# Patient Record
Sex: Male | Born: 1960
Health system: Southern US, Community
[De-identification: ages and names within clinical notes are randomized; demographics above are authoritative.]

## PROBLEM LIST (undated history)

## (undated) ENCOUNTER — Emergency Department (HOSPITAL_BASED_OUTPATIENT_CLINIC_OR_DEPARTMENT_OTHER): Admission: EM | Payer: 59

## (undated) DIAGNOSIS — Q2112 Patent foramen ovale: Secondary | ICD-10-CM

## (undated) DIAGNOSIS — I10 Essential (primary) hypertension: Secondary | ICD-10-CM

## (undated) DIAGNOSIS — J189 Pneumonia, unspecified organism: Secondary | ICD-10-CM

## (undated) DIAGNOSIS — Q211 Atrial septal defect: Secondary | ICD-10-CM

## (undated) DIAGNOSIS — Z86718 Personal history of other venous thrombosis and embolism: Secondary | ICD-10-CM

## (undated) DIAGNOSIS — K648 Other hemorrhoids: Secondary | ICD-10-CM

## (undated) DIAGNOSIS — I739 Peripheral vascular disease, unspecified: Secondary | ICD-10-CM

## (undated) DIAGNOSIS — K219 Gastro-esophageal reflux disease without esophagitis: Secondary | ICD-10-CM

## (undated) DIAGNOSIS — F1910 Other psychoactive substance abuse, uncomplicated: Secondary | ICD-10-CM

## (undated) DIAGNOSIS — N280 Ischemia and infarction of kidney: Secondary | ICD-10-CM

## (undated) DIAGNOSIS — D689 Coagulation defect, unspecified: Secondary | ICD-10-CM

## (undated) HISTORY — PX: CHOLECYSTECTOMY: SHX55

## (undated) HISTORY — DX: Coagulation defect, unspecified: D68.9

## (undated) HISTORY — PX: WRIST SURGERY: SHX841

## (undated) HISTORY — DX: Peripheral vascular disease, unspecified: I73.9

## (undated) HISTORY — DX: Gastro-esophageal reflux disease without esophagitis: K21.9

## (undated) HISTORY — DX: Ischemia and infarction of kidney: N28.0

## (undated) HISTORY — DX: Personal history of other venous thrombosis and embolism: Z86.718

## (undated) HISTORY — DX: Other psychoactive substance abuse, uncomplicated: F19.10

## (undated) HISTORY — PX: TONSILLECTOMY: SUR1361

## (undated) HISTORY — PX: COLONOSCOPY: SHX174

## (undated) HISTORY — DX: Essential (primary) hypertension: I10

## (undated) HISTORY — DX: Other hemorrhoids: K64.8

---

## 1999-03-08 ENCOUNTER — Ambulatory Visit (HOSPITAL_COMMUNITY): Admission: RE | Admit: 1999-03-08 | Discharge: 1999-03-08 | Payer: Self-pay | Admitting: *Deleted

## 1999-03-09 ENCOUNTER — Encounter (HOSPITAL_COMMUNITY): Admission: RE | Admit: 1999-03-09 | Discharge: 1999-06-07 | Payer: Self-pay | Admitting: *Deleted

## 2000-09-16 ENCOUNTER — Encounter: Payer: Self-pay | Admitting: Family Medicine

## 2000-09-16 ENCOUNTER — Emergency Department (HOSPITAL_COMMUNITY): Admission: EM | Admit: 2000-09-16 | Discharge: 2000-09-16 | Payer: Self-pay | Admitting: *Deleted

## 2000-10-06 ENCOUNTER — Encounter (INDEPENDENT_AMBULATORY_CARE_PROVIDER_SITE_OTHER): Payer: Self-pay | Admitting: Specialist

## 2000-10-06 ENCOUNTER — Other Ambulatory Visit: Admission: RE | Admit: 2000-10-06 | Discharge: 2000-10-06 | Payer: Self-pay | Admitting: *Deleted

## 2001-09-01 ENCOUNTER — Encounter: Payer: Self-pay | Admitting: Surgery

## 2001-09-01 ENCOUNTER — Emergency Department (HOSPITAL_COMMUNITY): Admission: EM | Admit: 2001-09-01 | Discharge: 2001-09-01 | Payer: Self-pay | Admitting: Emergency Medicine

## 2001-10-05 ENCOUNTER — Ambulatory Visit (HOSPITAL_COMMUNITY): Admission: RE | Admit: 2001-10-05 | Discharge: 2001-10-05 | Payer: Self-pay | Admitting: *Deleted

## 2001-10-05 ENCOUNTER — Encounter: Payer: Self-pay | Admitting: *Deleted

## 2003-03-26 ENCOUNTER — Encounter: Admission: RE | Admit: 2003-03-26 | Discharge: 2003-03-26 | Payer: Self-pay | Admitting: Family Medicine

## 2003-03-26 ENCOUNTER — Encounter: Payer: Self-pay | Admitting: Family Medicine

## 2004-08-12 ENCOUNTER — Ambulatory Visit: Payer: Self-pay | Admitting: Family Medicine

## 2004-11-13 ENCOUNTER — Emergency Department (HOSPITAL_COMMUNITY): Admission: EM | Admit: 2004-11-13 | Discharge: 2004-11-13 | Payer: Self-pay | Admitting: Emergency Medicine

## 2005-01-06 ENCOUNTER — Emergency Department (HOSPITAL_COMMUNITY): Admission: EM | Admit: 2005-01-06 | Discharge: 2005-01-06 | Payer: Self-pay | Admitting: Emergency Medicine

## 2005-01-10 ENCOUNTER — Ambulatory Visit: Payer: Self-pay | Admitting: Internal Medicine

## 2005-05-06 ENCOUNTER — Emergency Department (HOSPITAL_COMMUNITY): Admission: EM | Admit: 2005-05-06 | Discharge: 2005-05-06 | Payer: Self-pay | Admitting: *Deleted

## 2005-05-31 ENCOUNTER — Ambulatory Visit: Payer: Self-pay | Admitting: Family Medicine

## 2005-05-31 ENCOUNTER — Ambulatory Visit (HOSPITAL_COMMUNITY): Admission: RE | Admit: 2005-05-31 | Discharge: 2005-05-31 | Payer: Self-pay | Admitting: Family Medicine

## 2005-05-31 ENCOUNTER — Encounter: Admission: RE | Admit: 2005-05-31 | Discharge: 2005-05-31 | Payer: Self-pay | Admitting: Family Medicine

## 2005-06-03 ENCOUNTER — Ambulatory Visit: Payer: Self-pay | Admitting: Family Medicine

## 2005-06-09 ENCOUNTER — Ambulatory Visit: Payer: Self-pay | Admitting: Family Medicine

## 2005-10-31 ENCOUNTER — Emergency Department (HOSPITAL_COMMUNITY): Admission: EM | Admit: 2005-10-31 | Discharge: 2005-11-01 | Payer: Self-pay | Admitting: Emergency Medicine

## 2005-11-01 ENCOUNTER — Ambulatory Visit: Payer: Self-pay | Admitting: Family Medicine

## 2005-11-18 ENCOUNTER — Ambulatory Visit: Payer: Self-pay | Admitting: Gastroenterology

## 2005-12-06 ENCOUNTER — Ambulatory Visit: Payer: Self-pay | Admitting: Gastroenterology

## 2005-12-15 ENCOUNTER — Ambulatory Visit: Payer: Self-pay | Admitting: Gastroenterology

## 2006-01-27 ENCOUNTER — Ambulatory Visit: Payer: Self-pay | Admitting: Internal Medicine

## 2006-01-31 ENCOUNTER — Ambulatory Visit: Payer: Self-pay | Admitting: Internal Medicine

## 2006-02-02 ENCOUNTER — Ambulatory Visit: Payer: Self-pay | Admitting: Internal Medicine

## 2006-02-10 ENCOUNTER — Ambulatory Visit: Payer: Self-pay | Admitting: Internal Medicine

## 2006-02-10 ENCOUNTER — Ambulatory Visit (HOSPITAL_COMMUNITY): Admission: RE | Admit: 2006-02-10 | Discharge: 2006-02-10 | Payer: Self-pay | Admitting: Internal Medicine

## 2006-02-10 ENCOUNTER — Encounter: Payer: Self-pay | Admitting: Vascular Surgery

## 2006-02-15 ENCOUNTER — Ambulatory Visit: Payer: Self-pay | Admitting: Internal Medicine

## 2006-03-07 ENCOUNTER — Ambulatory Visit: Payer: Self-pay | Admitting: Internal Medicine

## 2006-03-11 ENCOUNTER — Encounter: Admission: RE | Admit: 2006-03-11 | Discharge: 2006-03-11 | Payer: Self-pay | Admitting: Internal Medicine

## 2006-03-13 ENCOUNTER — Ambulatory Visit: Payer: Self-pay | Admitting: Internal Medicine

## 2006-04-18 ENCOUNTER — Ambulatory Visit: Payer: Self-pay | Admitting: Internal Medicine

## 2006-08-22 ENCOUNTER — Ambulatory Visit: Payer: Self-pay | Admitting: Internal Medicine

## 2006-08-23 ENCOUNTER — Ambulatory Visit: Payer: Self-pay

## 2006-08-23 ENCOUNTER — Ambulatory Visit: Payer: Self-pay | Admitting: Cardiovascular Disease

## 2006-08-23 ENCOUNTER — Inpatient Hospital Stay (HOSPITAL_COMMUNITY): Admission: AD | Admit: 2006-08-23 | Discharge: 2006-09-03 | Payer: Self-pay | Admitting: Cardiovascular Disease

## 2006-08-24 ENCOUNTER — Encounter: Payer: Self-pay | Admitting: Cardiology

## 2006-08-24 ENCOUNTER — Encounter (INDEPENDENT_AMBULATORY_CARE_PROVIDER_SITE_OTHER): Payer: Self-pay | Admitting: Specialist

## 2006-08-25 ENCOUNTER — Encounter: Payer: Self-pay | Admitting: Vascular Surgery

## 2006-08-25 ENCOUNTER — Encounter: Payer: Self-pay | Admitting: Internal Medicine

## 2006-09-06 ENCOUNTER — Ambulatory Visit: Payer: Self-pay | Admitting: Internal Medicine

## 2006-09-06 ENCOUNTER — Ambulatory Visit: Payer: Self-pay | Admitting: Cardiovascular Disease

## 2006-09-13 ENCOUNTER — Ambulatory Visit: Payer: Self-pay | Admitting: Cardiology

## 2006-09-20 ENCOUNTER — Ambulatory Visit: Payer: Self-pay | Admitting: Vascular Surgery

## 2006-09-21 ENCOUNTER — Ambulatory Visit: Payer: Self-pay | Admitting: Cardiology

## 2006-09-21 ENCOUNTER — Ambulatory Visit: Payer: Self-pay | Admitting: Cardiovascular Disease

## 2006-10-05 ENCOUNTER — Ambulatory Visit: Payer: Self-pay

## 2006-10-05 ENCOUNTER — Ambulatory Visit: Payer: Self-pay | Admitting: Cardiology

## 2006-10-06 ENCOUNTER — Ambulatory Visit: Payer: Self-pay | Admitting: Vascular Surgery

## 2006-10-16 ENCOUNTER — Ambulatory Visit: Payer: Self-pay | Admitting: Cardiology

## 2006-11-02 ENCOUNTER — Ambulatory Visit: Payer: Self-pay | Admitting: Cardiology

## 2006-11-06 ENCOUNTER — Ambulatory Visit: Payer: Self-pay | Admitting: Internal Medicine

## 2006-11-06 LAB — CONVERTED CEMR LAB
Chlamydia, DNA Probe: NEGATIVE
GC Probe Amp, Genital: NEGATIVE

## 2006-11-09 ENCOUNTER — Ambulatory Visit: Payer: Self-pay | Admitting: Cardiology

## 2006-11-16 ENCOUNTER — Ambulatory Visit: Payer: Self-pay | Admitting: Cardiology

## 2006-11-24 ENCOUNTER — Ambulatory Visit: Payer: Self-pay | Admitting: Internal Medicine

## 2006-12-25 DIAGNOSIS — Z86718 Personal history of other venous thrombosis and embolism: Secondary | ICD-10-CM

## 2006-12-28 ENCOUNTER — Ambulatory Visit: Payer: Self-pay | Admitting: Cardiology

## 2007-01-11 ENCOUNTER — Ambulatory Visit: Payer: Self-pay | Admitting: Cardiology

## 2007-01-18 ENCOUNTER — Ambulatory Visit: Payer: Self-pay | Admitting: Cardiovascular Disease

## 2007-01-23 ENCOUNTER — Ambulatory Visit: Payer: Self-pay | Admitting: Cardiology

## 2007-02-20 ENCOUNTER — Ambulatory Visit: Payer: Self-pay | Admitting: Cardiovascular Disease

## 2007-03-06 ENCOUNTER — Ambulatory Visit: Payer: Self-pay | Admitting: Cardiology

## 2007-03-13 ENCOUNTER — Ambulatory Visit: Payer: Self-pay | Admitting: Vascular Surgery

## 2007-03-22 ENCOUNTER — Ambulatory Visit: Payer: Self-pay | Admitting: Cardiovascular Disease

## 2007-04-18 ENCOUNTER — Ambulatory Visit: Payer: Self-pay | Admitting: Cardiology

## 2007-05-04 ENCOUNTER — Ambulatory Visit: Payer: Self-pay | Admitting: Cardiovascular Disease

## 2007-05-28 ENCOUNTER — Ambulatory Visit: Payer: Self-pay | Admitting: Internal Medicine

## 2007-05-28 DIAGNOSIS — N2889 Other specified disorders of kidney and ureter: Secondary | ICD-10-CM | POA: Insufficient documentation

## 2007-05-28 DIAGNOSIS — F1910 Other psychoactive substance abuse, uncomplicated: Secondary | ICD-10-CM | POA: Insufficient documentation

## 2007-06-25 ENCOUNTER — Ambulatory Visit: Payer: Self-pay | Admitting: Cardiology

## 2007-07-09 ENCOUNTER — Ambulatory Visit: Payer: Self-pay | Admitting: Internal Medicine

## 2007-07-20 ENCOUNTER — Ambulatory Visit: Payer: Self-pay | Admitting: Cardiology

## 2007-08-09 HISTORY — PX: OTHER SURGICAL HISTORY: SHX169

## 2007-08-22 ENCOUNTER — Ambulatory Visit: Payer: Self-pay | Admitting: Cardiovascular Disease

## 2007-09-18 ENCOUNTER — Ambulatory Visit: Payer: Self-pay | Admitting: Vascular Surgery

## 2007-09-21 ENCOUNTER — Ambulatory Visit: Payer: Self-pay | Admitting: Internal Medicine

## 2007-10-01 ENCOUNTER — Ambulatory Visit: Payer: Self-pay | Admitting: Cardiology

## 2007-10-15 ENCOUNTER — Ambulatory Visit: Payer: Self-pay | Admitting: Cardiology

## 2007-10-29 ENCOUNTER — Ambulatory Visit: Payer: Self-pay | Admitting: Cardiology

## 2007-11-20 ENCOUNTER — Ambulatory Visit: Payer: Self-pay | Admitting: Cardiology

## 2007-12-18 ENCOUNTER — Ambulatory Visit: Payer: Self-pay | Admitting: Cardiovascular Disease

## 2008-01-15 ENCOUNTER — Ambulatory Visit: Payer: Self-pay | Admitting: Internal Medicine

## 2008-01-29 ENCOUNTER — Ambulatory Visit: Payer: Self-pay | Admitting: Cardiovascular Disease

## 2008-02-04 ENCOUNTER — Ambulatory Visit: Payer: Self-pay | Admitting: Internal Medicine

## 2008-02-04 DIAGNOSIS — L989 Disorder of the skin and subcutaneous tissue, unspecified: Secondary | ICD-10-CM | POA: Insufficient documentation

## 2008-02-26 ENCOUNTER — Ambulatory Visit: Payer: Self-pay | Admitting: Cardiology

## 2008-03-27 ENCOUNTER — Encounter (INDEPENDENT_AMBULATORY_CARE_PROVIDER_SITE_OTHER): Payer: Self-pay | Admitting: *Deleted

## 2008-03-27 ENCOUNTER — Telehealth (INDEPENDENT_AMBULATORY_CARE_PROVIDER_SITE_OTHER): Payer: Self-pay | Admitting: *Deleted

## 2008-03-31 ENCOUNTER — Ambulatory Visit: Payer: Self-pay | Admitting: Internal Medicine

## 2008-04-01 ENCOUNTER — Ambulatory Visit: Payer: Self-pay | Admitting: Vascular Surgery

## 2008-04-10 ENCOUNTER — Ambulatory Visit: Payer: Self-pay | Admitting: Internal Medicine

## 2008-04-17 ENCOUNTER — Encounter: Payer: Self-pay | Admitting: Internal Medicine

## 2008-05-13 ENCOUNTER — Ambulatory Visit: Payer: Self-pay | Admitting: Cardiovascular Disease

## 2008-05-29 ENCOUNTER — Ambulatory Visit: Payer: Self-pay | Admitting: Cardiology

## 2008-06-06 ENCOUNTER — Ambulatory Visit: Payer: Self-pay | Admitting: Cardiology

## 2008-07-07 ENCOUNTER — Ambulatory Visit: Payer: Self-pay | Admitting: Cardiovascular Disease

## 2008-07-17 ENCOUNTER — Ambulatory Visit: Payer: Self-pay | Admitting: Cardiology

## 2008-08-14 ENCOUNTER — Ambulatory Visit: Payer: Self-pay | Admitting: Cardiovascular Disease

## 2008-08-19 ENCOUNTER — Ambulatory Visit: Payer: Self-pay | Admitting: Cardiovascular Disease

## 2008-08-21 ENCOUNTER — Ambulatory Visit: Payer: Self-pay | Admitting: Cardiovascular Disease

## 2008-08-21 LAB — CONVERTED CEMR LAB
AST: 22 units/L (ref 0–37)
Albumin: 3.6 g/dL (ref 3.5–5.2)
Direct LDL: 142.7 mg/dL
Total Bilirubin: 0.8 mg/dL (ref 0.3–1.2)
Total CHOL/HDL Ratio: 4.3
Triglycerides: 72 mg/dL (ref 0–149)

## 2008-10-06 ENCOUNTER — Ambulatory Visit: Payer: Self-pay | Admitting: Cardiology

## 2008-11-20 ENCOUNTER — Ambulatory Visit: Payer: Self-pay | Admitting: Internal Medicine

## 2008-11-24 ENCOUNTER — Ambulatory Visit: Payer: Self-pay | Admitting: Cardiovascular Disease

## 2008-12-12 ENCOUNTER — Encounter (INDEPENDENT_AMBULATORY_CARE_PROVIDER_SITE_OTHER): Payer: Self-pay

## 2008-12-17 ENCOUNTER — Telehealth: Payer: Self-pay | Admitting: Cardiovascular Disease

## 2008-12-17 LAB — CONVERTED CEMR LAB
ALT: 26 units/L (ref 0–53)
AST: 30 units/L (ref 0–37)
Albumin: 3.8 g/dL (ref 3.5–5.2)
Alkaline Phosphatase: 82 units/L (ref 39–117)
Bilirubin, Direct: 0.1 mg/dL (ref 0.0–0.3)
Cholesterol: 217 mg/dL — ABNORMAL HIGH (ref 0–200)
Total CHOL/HDL Ratio: 4

## 2008-12-25 ENCOUNTER — Ambulatory Visit: Payer: Self-pay | Admitting: Internal Medicine

## 2009-01-06 ENCOUNTER — Encounter: Payer: Self-pay | Admitting: *Deleted

## 2009-01-22 ENCOUNTER — Ambulatory Visit: Payer: Self-pay | Admitting: Internal Medicine

## 2009-01-22 ENCOUNTER — Encounter: Payer: Self-pay | Admitting: Nurse Practitioner

## 2009-01-22 ENCOUNTER — Encounter (INDEPENDENT_AMBULATORY_CARE_PROVIDER_SITE_OTHER): Payer: Self-pay | Admitting: Pharmacist

## 2009-01-22 LAB — CONVERTED CEMR LAB: Protime: 17

## 2009-01-30 ENCOUNTER — Telehealth: Payer: Self-pay | Admitting: Cardiovascular Disease

## 2009-02-02 ENCOUNTER — Ambulatory Visit: Payer: Self-pay | Admitting: Cardiology

## 2009-02-02 LAB — CONVERTED CEMR LAB: Prothrombin Time: 57.2 s (ref 10.9–13.3)

## 2009-02-11 ENCOUNTER — Encounter: Payer: Self-pay | Admitting: *Deleted

## 2009-02-11 DIAGNOSIS — I739 Peripheral vascular disease, unspecified: Secondary | ICD-10-CM

## 2009-02-12 ENCOUNTER — Ambulatory Visit: Payer: Self-pay | Admitting: Internal Medicine

## 2009-02-12 ENCOUNTER — Encounter: Payer: Self-pay | Admitting: Nurse Practitioner

## 2009-02-12 LAB — CONVERTED CEMR LAB: Prothrombin Time: 20.2 s

## 2009-02-17 ENCOUNTER — Telehealth (INDEPENDENT_AMBULATORY_CARE_PROVIDER_SITE_OTHER): Payer: Self-pay

## 2009-02-18 ENCOUNTER — Encounter: Payer: Self-pay | Admitting: Cardiology

## 2009-02-18 ENCOUNTER — Ambulatory Visit: Payer: Self-pay

## 2009-03-02 ENCOUNTER — Ambulatory Visit: Payer: Self-pay | Admitting: Internal Medicine

## 2009-03-02 DIAGNOSIS — M25519 Pain in unspecified shoulder: Secondary | ICD-10-CM | POA: Insufficient documentation

## 2009-03-05 ENCOUNTER — Ambulatory Visit: Payer: Self-pay | Admitting: Cardiovascular Disease

## 2009-03-05 LAB — CONVERTED CEMR LAB: Prothrombin Time: 18.5 s

## 2009-03-06 ENCOUNTER — Encounter: Payer: Self-pay | Admitting: Internal Medicine

## 2009-04-05 ENCOUNTER — Telehealth (INDEPENDENT_AMBULATORY_CARE_PROVIDER_SITE_OTHER): Payer: Self-pay | Admitting: Physician Assistant

## 2009-04-22 ENCOUNTER — Ambulatory Visit: Payer: Self-pay | Admitting: Internal Medicine

## 2009-05-22 ENCOUNTER — Ambulatory Visit: Payer: Self-pay | Admitting: Internal Medicine

## 2009-06-18 ENCOUNTER — Ambulatory Visit: Payer: Self-pay | Admitting: Internal Medicine

## 2009-06-18 LAB — CONVERTED CEMR LAB: POC INR: 2.5

## 2009-08-20 ENCOUNTER — Ambulatory Visit: Payer: Self-pay | Admitting: Internal Medicine

## 2009-08-20 LAB — CONVERTED CEMR LAB: POC INR: 3.4

## 2009-09-03 ENCOUNTER — Ambulatory Visit: Payer: Self-pay | Admitting: Cardiovascular Disease

## 2009-09-03 DIAGNOSIS — E785 Hyperlipidemia, unspecified: Secondary | ICD-10-CM

## 2009-09-03 DIAGNOSIS — R079 Chest pain, unspecified: Secondary | ICD-10-CM

## 2009-11-24 ENCOUNTER — Ambulatory Visit: Payer: Self-pay

## 2009-11-24 ENCOUNTER — Encounter: Payer: Self-pay | Admitting: Cardiovascular Disease

## 2009-11-24 ENCOUNTER — Ambulatory Visit: Payer: Self-pay | Admitting: Cardiovascular Disease

## 2009-11-25 LAB — CONVERTED CEMR LAB
AST: 28 units/L (ref 0–37)
Albumin: 3.9 g/dL (ref 3.5–5.2)
Alkaline Phosphatase: 64 units/L (ref 39–117)
Cholesterol: 180 mg/dL (ref 0–200)
HDL: 67.2 mg/dL (ref >39.00)
Total CHOL/HDL Ratio: 3

## 2009-12-25 ENCOUNTER — Encounter (INDEPENDENT_AMBULATORY_CARE_PROVIDER_SITE_OTHER): Payer: Self-pay | Admitting: Pharmacist

## 2010-01-28 ENCOUNTER — Ambulatory Visit: Payer: Self-pay | Admitting: Internal Medicine

## 2010-02-18 ENCOUNTER — Ambulatory Visit: Payer: Self-pay | Admitting: Internal Medicine

## 2010-03-04 ENCOUNTER — Ambulatory Visit: Payer: Self-pay | Admitting: Internal Medicine

## 2010-03-04 LAB — CONVERTED CEMR LAB: POC INR: 2.2

## 2010-05-27 ENCOUNTER — Ambulatory Visit: Payer: Self-pay | Admitting: Cardiovascular Disease

## 2010-09-01 ENCOUNTER — Encounter (INDEPENDENT_AMBULATORY_CARE_PROVIDER_SITE_OTHER): Payer: Self-pay | Admitting: Pharmacist

## 2010-09-03 ENCOUNTER — Telehealth (INDEPENDENT_AMBULATORY_CARE_PROVIDER_SITE_OTHER): Payer: Self-pay | Admitting: *Deleted

## 2010-09-04 ENCOUNTER — Telehealth: Payer: Self-pay | Admitting: Physician Assistant

## 2010-09-07 ENCOUNTER — Ambulatory Visit: Admission: RE | Admit: 2010-09-07 | Discharge: 2010-09-07 | Payer: Self-pay | Source: Home / Self Care

## 2010-09-07 LAB — CONVERTED CEMR LAB: POC INR: 1

## 2010-09-07 NOTE — Medication Information (Signed)
Summary: rov/cb  Anticoagulant Therapy  Managed by: Cloyde Reams, RN, BSN Referring MD: [name] PCP: Dr. Sherren Mocha MD: Johney Frame MD, Fayrene Fearing Indication 1: DVT Indication 2: Phlebitis/Thrombophlebitis: deep veins of upper ex Lab Used: [facility] Site: [facility] INR POC 3.6 INR RANGE 2 - 3   Health status changes: yes       Details: Pt c/o sharp pains in center of chest, onset x 1 week ago has had several episodes since last week, usually lasts 5 minutes, pain progressively gets worse. 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     Bleeding/hemorrhagic complications: no    Recent/future hospitalizations: no    Any changes in medication regimen? no    Recent/future dental: no  Any missed doses?: no       Is patient compliant with meds? yes       Allergies: No Known Drug Allergies  Anticoagulation Management History:      The patient is taking warfarin and comes in today for a routine follow up visit.  Negative risk factors for bleeding include an age less than 13 years old.  The bleeding index is 'low risk'.  Negative CHADS2 values include Age > 39 years old.  The start date was 08/30/2006.  His last INR was 5.8 ratio.  Anticoagulation responsible provider: Ashantee Deupree MD, Fayrene Fearing.  INR POC: 3.6.  Cuvette Lot#: [id number].  Exp: 04/2011.    Anticoagulation Management Assessment/Plan:      The patient's current anticoagulation dose is Warfarin sodium 10 mg tabs: Use as directed by Anticoagulation Clinic.  The target INR is 2 - 3.  The next INR is due 02/18/2010.  Anticoagulation instructions were given to patient.  Results were reviewed/authorized by Cloyde Reams, RN, BSN.  He was notified by Cloyde Reams RN.         Prior Anticoagulation Instructions: INR 3.4  Take 1/2 tablet tomorrow then continue same dose of 1 tablet daily. Recheck in 4 weeks.  Current Anticoagulation Instructions: INR 3.6  Skip tomorrow's dosage of coumadin, then start taking 1  tablet daily except 1/2 tablet on Mondays.  Recheck in 3 weeks.

## 2010-09-07 NOTE — Medication Information (Signed)
Summary: [name]  Anticoagulant Therapy  Managed by: Cloyde Reams, RN, BSN Referring MD: [name] PCP: Dr. Sherren Mocha MD: Gala Romney MD, Reuel Boom Indication 1: DVT Indication 2: Phlebitis/Thrombophlebitis: deep veins of upper ex Lab [facility]  Site: [facility] INR POC 2.2 INR RANGE 2 - 3  Dietary changes: no    Health status changes: no    Bleeding/hemorrhagic complications: yes       Details: Bruised from moving boxes.    Recent/future hospitalizations: no    Any changes in medication regimen? no    Recent/future dental: no  Any missed doses?: yes     Details: May have missed a dose?  Is patient compliant with meds? yes       Allergies: No Known Drug Allergies  Anticoagulation Management History:      The patient is taking warfarin and comes in today for a routine follow up visit.  Negative risk factors for bleeding include an age less than 39 years old.  The bleeding index is 'low risk'.  Negative CHADS2 values include Age > 54 years old.  The start date was 08/30/2006.  His last INR was 5.8 ratio.  Anticoagulation responsible provider: Alonda Weaber MD, Reuel Boom.  INR POC: 2.2.  Cuvette Lot#: [id number].  Exp: 05/2011.    Anticoagulation Management Assessment/Plan:      The patient's current anticoagulation dose is Warfarin sodium 10 mg tabs: Use as directed by Anticoagulation Clinic.  The target INR is 2 - 3.  The next INR is due 03/25/2010.  Anticoagulation instructions were given to patient.  Results were reviewed/authorized by Cloyde Reams, RN, BSN.  He was notified by Cloyde Reams RN.         Prior Anticoagulation Instructions: INR 4.3  Skip tomorrow's dosage, then take 1/2 tablet on Saturday, then start taking 1 tablet daily except 1/2 tablet on Mondays and Fridays. Recheck in 2 weeks.  Current Anticoagulation Instructions: INR 2.2  Start taking 1 tablet daily except 1/2 tablet on Mondays.  Recheck in 3 weeks.

## 2010-09-07 NOTE — Medication Information (Signed)
Summary: rov/tm  Anticoagulant Therapy  Managed by: Reina Fuse, PharmD Referring MD: [name] PCP: Dr. Sherren Mocha MD: Eden Emms MD, Theron Arista Indication 1: DVT Indication 2: Phlebitis/Thrombophlebitis: deep veins of upper ex Lab Used: [facility] Site: [facility] INR POC 2.3 INR RANGE 2 - 3  Dietary changes: no    Health status changes: no    Bleeding/hemorrhagic complications: no    Recent/future hospitalizations: no    Any changes in medication regimen? no    Recent/future dental: no  Any missed doses?: yes     Details: Missed 2 doses in the past week.   Is patient compliant with meds? no     Details: Pt has not been seen in Coumadin Clinic since July and reports frequently missing doses.    Current Medications (verified): 1)  Warfarin Sodium 10 Mg Tabs (Warfarin Sodium) .... Use As Directed By Anticoagulation Clinic 2)  Vitamin C 500 Mg Tabs (Ascorbic Acid) .[name].. 1 Tab Once Daily 3)  Simvastatin 40 Mg Tabs (Simvastatin) .... Take One Tablet By Mouth Daily At Bedtime 4)  Chantix Starting Month Pak 0.5 Mg X 11 & 1 Mg X 42 Tabs (Varenicline Tartrate) .... As Directed.  Allergies (verified): No Known Drug Allergies  Anticoagulation Management History:      The patient is taking warfarin and comes in today for a routine follow up visit.  Negative risk factors for bleeding include an age less than 58 years old.  The bleeding index is 'low risk'.  Negative CHADS2 values include Age > 26 years old.  The start date was 08/30/2006.  His last INR was 5.8 ratio.  Anticoagulation responsible provider: Eden Emms MD, Theron Arista.  INR POC: 2.3.  Cuvette Lot#: [id number].  Exp: 05/2011.    Anticoagulation Management Assessment/Plan:      The patient's current anticoagulation dose is Warfarin sodium 10 mg tabs: Use as directed by Anticoagulation Clinic.  The target INR is 2 - 3.  The next INR is due 06/24/2010.  Anticoagulation instructions were given to patient.  Results were  reviewed/authorized by Reina Fuse, PharmD.  He was notified by Reina Fuse PharmD.         Prior Anticoagulation Instructions: INR 2.2  Start taking 1 tablet daily except 1/2 tablet on Mondays.  Recheck in 3 weeks.    Current Anticoagulation Instructions: INR 2.3  Continue taking Coumadin 10 mg (1 tab) all days. Return to clinic in 4 weeks.

## 2010-09-07 NOTE — Medication Information (Signed)
Summary: ccr  Anticoagulant Therapy  Managed by: Leota Sauers, PharmD Referring MD: [name] Supervising MD: Ladona Ridgel MD, Sharlot Gowda Indication 1: DVT Indication 2: Phlebitis/Thrombophlebitis: deep veins of upper ex Lab Used: [facility] Site: [facility] INR POC 3.4 INR RANGE 2 - 3  Dietary changes: no    Health status changes: no    Bleeding/hemorrhagic complications: no    Recent/future hospitalizations: no    Any changes in medication regimen? no    Recent/future dental: no  Any missed doses?: yes     Details: Patient says if he misses a dose, he will take an extra tablet the next day or an extra half to make up for it. Is not consistent with medication regimen.  Is patient compliant with meds? yes       Current Medications (verified): 1)  Warfarin Sodium 10 Mg Tabs (Warfarin Sodium) .... Use As Directed By Anticoagulation Clinic  Allergies (verified): No Known Drug Allergies  Anticoagulation Management History:      The patient is taking warfarin and comes in today for a routine follow up visit.  Negative risk factors for bleeding include an age less than 43 years old.  The bleeding index is 'low risk'.  Negative CHADS2 values include Age > 86 years old.  The start date was 08/30/2006.  His last INR was 5.8 ratio.  Anticoagulation responsible provider: Ladona Ridgel MD, Sharlot Gowda.  INR POC: 3.4.  Cuvette Lot#: [id number].  Exp: 05/2010.    Anticoagulation Management Assessment/Plan:      The patient's current anticoagulation dose is Warfarin sodium 10 mg tabs: Use as directed by Anticoagulation Clinic.  The target INR is 2 - 3.  The next INR is due 09/17/2009.  Anticoagulation instructions were given to patient.  Results were reviewed/authorized by Leota Sauers, PharmD.  He was notified by Lew Dawes, PharmD Candidate.         Prior Anticoagulation Instructions: INR: 2.5  Continue on the current regimen of 1 tablet daily.  Recheck in 4 weeks.  Current Anticoagulation  Instructions: INR 3.4  Take 1/2 tablet tomorrow then continue same dose of 1 tablet daily. Recheck in 4 weeks.

## 2010-09-07 NOTE — Letter (Signed)
Summary: [facility]  Coumadin  [address]   [place], [place] [address]   Phone: [phone]  Fax: [phone]     Dec 25, 2009 MRN: [id number]   [facility] [address] [place], [place]  [address]   Dear Mr. Wymore,  This letter is being sent to you as a reminder that it is necessary for you to get your INR/PT checked regularly so that we can optimize your care.  Our records indicate that you were scheduled to have a test done recently.  As of today, we have not received the results of this test.  It is very important that you have your INR checked.  Please call our office at the number listed above to schedule an appointment at your earliest convenience.    If you have recently had your protime checked or have discontinued this medication, please contact our office at the above phone number to clarify this issue.  Thank you for this prompt attention to this important health care matter.  Sincerely,   [facility] Cardiovascular Risk Reduction Clinic Team

## 2010-09-07 NOTE — Assessment & Plan Note (Signed)
Summary: [name]   Visit Type:  1 yr f/u Primary Provider:  Dr. Drue Novel  CC:  chest pain..pt states every morning he vomits up phelgm  material..pt also c/o right arm pain says it wakes him up at times.  History of Present Illness: 50 y/o Af. Am. male with h/o dvt, renal infarct, and left leg ischemia req. urgent embolectomy in 2008.  He is on chronic coumadin. He complains of left sided chest pain, nonexertional, worse with certain movements. He denies shortness of breath. Also complains of pain in the left groin area that is sharp, shooting pain. Has some left calf pain with ambulation, but the groin pain is not affected by walking. Continue to cmoke cigarettes. Has been off of statin - denies side effects, simply didn't refill.  Current Medications (verified): 1)  Warfarin Sodium 10 Mg Tabs (Warfarin Sodium) .... Use As Directed By Anticoagulation Clinic 2)  Vitamin C 500 Mg Tabs (Ascorbic Acid) .[name].. 1 Tab Once Daily  Allergies (verified): No Known Drug Allergies  Past History:  Past medical history reviewed for relevance to current acute and chronic problems.  Past Medical History: Reviewed history from 02/12/2009 and no changes required. H/O CRITICAL LEFT LEG ISCHEMIA 08/2006      a. s/p embolectomy of the Left Femoral, Popliteal, and Tibial Arteries Edilia Bo - 08/2006)      b. known occlusion of R Popliteal with collaterals - not intervened upon      c. ABI's 03/2008: r: 1.0; L 0.91 H/O RT RENAL INFARCT H/O DVT CHEST PAIN      a. 10/05/06 - [place]. Myoview.  EF 60%, no ischemia. 9/07 NCS R CTS L MILD ULNAR NEUROPATHY ??C2 RADICULOPATHY  Review of Systems       Negative except as per HPI   Vital Signs:  Patient profile:   50 year old male Height:      73 inches Weight:      209 pounds Pulse rate:   84 / minute Pulse rhythm:   regular BP sitting:   106 / 70  (left arm) Cuff size:   large  Vitals Entered By: Danielle Rankin, CMA (September 03, 2009 3:47 PM)  Physical  Exam  General:  Pt is alert and oriented, African - American male, in no acute distress. HEENT: normal Neck: normal carotid upstrokes without bruits, JVP normal Lungs: CTA CV: RRR without murmur or gallop Abd: soft, NT, positive BS, no bruit, no organomegaly Ext: no clubbing, cyanosis, or edema. femoral pulses 2+=, pedal pulses 2+ right and trace left, feet are warm and well-perfused. Skin: warm and dry without rash    EKG  Procedure date:  09/03/2009  Findings:      NSR, within normal limits. HR 84 bpm.  Impression & Recommendations:  Problem # 1:  CHEST PAIN-UNSPECIFIED (ICD-786.50) CP is atypical. Myoview stress test was reviewed from 2010 and showed no ischemia. Continue risk reduction measures. The pt is noncompliant and I spent most of our time reviewing the importance of tobacco cessation and the importance of medication adherence. See below for secondary risk reduction measures.  His updated medication list for this problem includes:    Warfarin Sodium 10 Mg Tabs (Warfarin sodium) ..... Use as directed by anticoagulation clinic  Problem # 2:  PERIPHERAL VASCULAR DISEASE (ICD-443.9) Mild left leg claudication. Follow-up ABI's recommended. Pt is s/p embolectomy. continue coumadin.  Problem # 3:  HYPERLIPIDEMIA-MIXED (ICD-272.4) Resume simvastatin. repeat lipids and lft's in 12 weeks. His updated medication list for this  problem includes:    Simvastatin 40 Mg Tabs (Simvastatin) .[name]... Take one tablet by mouth daily at bedtime  CHOL: 217 (11/24/2008)   LDL: DEL (08/21/2008)   HDL: 56.10 (11/24/2008)   TG: 69.0 (11/24/2008)  Other Orders: EKG w/ Interpretation (93000) Arterial Duplex Lower Extremity (Arterial Duplex Low)  Patient Instructions: 1)  Your physician recommends that you schedule a follow-up appointment in: 12 months 2)  ABI'S in 12 weeks 3)  Your physician recommends that you return for a FASTING lipid profile: and LFT in 12  Weeks Prescriptions: SIMVASTATIN 40 MG TABS (SIMVASTATIN) Take one tablet by mouth daily at bedtime  #30 x 6   Entered by:   Ollen Gross, RN, BSN   Authorized by:   Norva Karvonen, MD   Signed by:   Ollen Gross, RN, BSN on 09/03/2009   Method used:   Electronically to        [facility] [phone]* (retail)       [address]       [place], [place]  [address]       Ph: [phone]       Fax: [phone]   RxID:   [id number]

## 2010-09-07 NOTE — Medication Information (Signed)
Summary: rov/ewj  Anticoagulant Therapy  Managed by: Cloyde Reams, RN, BSN Referring MD: [name] PCP: Dr. Sherren Mocha MD: Graciela Husbands MD, Viviann Spare Indication 1: DVT Indication 2: Phlebitis/Thrombophlebitis: deep veins of upper ex Lab Used: [facility] Site: [facility] INR POC 4.3 INR RANGE 2 - 3  Dietary changes: yes       Details: Drinks beer.  Health status changes: no    Bleeding/hemorrhagic complications: no    Recent/future hospitalizations: no    Any changes in medication regimen? yes       Details: Taking prn pain pills  Recent/future dental: no  Any missed doses?: no       Is patient compliant with meds? yes       Allergies: No Known Drug Allergies  Anticoagulation Management History:      The patient is taking warfarin and comes in today for a routine follow up visit.  Negative risk factors for bleeding include an age less than 40 years old.  The bleeding index is 'low risk'.  Negative CHADS2 values include Age > 26 years old.  The start date was 08/30/2006.  His last INR was 5.8 ratio.  Anticoagulation responsible provider: Graciela Husbands MD, Viviann Spare.  INR POC: 4.3.  Cuvette Lot#: [id number].  Exp: 04/2011.    Anticoagulation Management Assessment/Plan:      The patient's current anticoagulation dose is Warfarin sodium 10 mg tabs: Use as directed by Anticoagulation Clinic.  The target INR is 2 - 3.  The next INR is due 03/04/2010.  Anticoagulation instructions were given to patient.  Results were reviewed/authorized by Cloyde Reams, RN, BSN.  He was notified by Cloyde Reams RN.         Prior Anticoagulation Instructions: INR 3.6  Skip tomorrow's dosage of coumadin, then start taking 1 tablet daily except 1/2 tablet on Mondays.  Recheck in 3 weeks.    Current Anticoagulation Instructions: INR 4.3  Skip tomorrow's dosage, then take 1/2 tablet on Saturday, then start taking 1 tablet daily except 1/2 tablet on Mondays and Fridays. Recheck in 2 weeks.

## 2010-09-09 NOTE — Progress Notes (Signed)
Summary: refill  Phone Note Refill Request Message from:  Patient on September 03, 2010 10:18 AM  Refills Requested: Medication #1:  WARFARIN SODIUM 10 MG TABS Use as directed by Anticoagulation Clinic [facility] [address]  Initial call taken by: Judie Grieve,  September 03, 2010 10:18 AM  Follow-up for Phone Call        Attempted to reach pt.  No working numbers listed.  Letter sent 1/25.  Pt has not been seen since October.  Must be seen in office prior to refills being done.  LM with [facility] with this information.  Follow-up by: Weston Brass PharmD,  September 03, 2010 11:42 AM

## 2010-09-09 NOTE — Letter (Signed)
Summary: [facility]in [facility]  Coumadin  [address]   [place], [place] [address]   Phone: [phone]  Fax: [phone]     September 01, 2010 MRN: [id number]   [facility] [address] [place], [place]  [address]   Dear Mr. Dymond,  This letter is being sent to you as a reminder that it is necessary for you to get your INR/PT checked regularly so that we can optimize your care.  Our records indicate that you were scheduled to have a test done recently.  As of today, we have not received the results of this test.  It is very important that you have your INR checked.  Please call our office at the number listed above to schedule an appointment at your earliest convenience.    If you have recently had your protime checked or have discontinued this medication, please contact our office at the above phone number to clarify this issue.  Thank you for this prompt attention to this important health care matter.  Sincerely,   [facility] Cardiovascular Risk Reduction Clinic Team

## 2010-09-09 NOTE — Progress Notes (Addendum)
Summary: Warfarin Refill Request  Phone Note Call from Patient Call back at [phone]   Caller: Patient Reason for Call: Refill Medication Summary of Call: Patient requesting warfarin. Reviewed chart.  Note that he has not been seen since October.  Note that PharmD in coumadin clinic requests he be seen in office before refill can be made.  I explained this to the patient. He has been advised to follow up on Tues 1/31 as scheduled.  Initial call taken by: Tereso Newcomer PA-C,  September 04, 2010 5:30 PM

## 2010-09-15 NOTE — Medication Information (Signed)
Summary: ccr  Anticoagulant Therapy  Managed by: Bethena Midget, RN, BSN Referring MD: [name] PCP: Dr. Sherren Mocha MD: Myrtis Ser MD, Tinnie Gens Indication 1: DVT Indication 2: Phlebitis/Thrombophlebitis: deep veins of upper ex Lab Used: [facility] Site: [facility] INR POC 1.0 INR RANGE 2 - 3  Dietary changes: no    Health status changes: no    Bleeding/hemorrhagic complications: no    Recent/future hospitalizations: no    Any changes in medication regimen? no    Recent/future dental: no  Any missed doses?: yes     Details: Ran out 5 days ago  Is patient compliant with meds? yes       Allergies: No Known Drug Allergies  Anticoagulation Management History:      The patient is taking warfarin and comes in today for a routine follow up visit.  Negative risk factors for bleeding include an age less than 52 years old.  The bleeding index is 'low risk'.  Negative CHADS2 values include Age > 86 years old.  The start date was 08/30/2006.  His last INR was 5.8 ratio.  Anticoagulation responsible provider: Myrtis Ser MD, Tinnie Gens.  INR POC: 1.0.  Cuvette Lot#: [id number].  Exp: 08/2011.    Anticoagulation Management Assessment/Plan:      The patient's current anticoagulation dose is Warfarin sodium 10 mg tabs: Use as directed by Anticoagulation Clinic.  The target INR is 2 - 3.  The next INR is due 09/17/2010.  Anticoagulation instructions were given to patient.  Results were reviewed/authorized by Bethena Midget, RN, BSN.  He was notified by Bethena Midget, RN, BSN.         Prior Anticoagulation Instructions: INR 2.3  Continue taking Coumadin 10 mg (1 tab) all days. Return to clinic in 4 weeks.   Current Anticoagulation Instructions: INR 1.0 Today and tomorrow take 1.5 pillls then resume 1 pill everyday. Recheck in one week.  Prescriptions: WARFARIN SODIUM 10 MG TABS (WARFARIN SODIUM) Use as directed by Anticoagulation Clinic  #90 Each x 0   Entered by:   Bethena Midget, RN, BSN  Authorized by:   Norva Karvonen, MD   Signed by:   Bethena Midget, RN, BSN on 09/07/2010   Method used:   Electronically to        [facility] [phone]* (retail)       [address]       [place], [place]  [address]       Ph: [phone]       Fax: [phone]   RxID:   [id number]

## 2010-09-17 ENCOUNTER — Encounter: Payer: Self-pay | Admitting: Cardiology

## 2010-09-17 ENCOUNTER — Encounter (INDEPENDENT_AMBULATORY_CARE_PROVIDER_SITE_OTHER): Payer: Managed Care, Other (non HMO)

## 2010-09-17 DIAGNOSIS — Z7901 Long term (current) use of anticoagulants: Secondary | ICD-10-CM

## 2010-09-17 DIAGNOSIS — I8010 Phlebitis and thrombophlebitis of unspecified femoral vein: Secondary | ICD-10-CM

## 2010-09-21 ENCOUNTER — Encounter: Payer: Self-pay | Admitting: Cardiovascular Disease

## 2010-09-21 ENCOUNTER — Ambulatory Visit (INDEPENDENT_AMBULATORY_CARE_PROVIDER_SITE_OTHER): Payer: Managed Care, Other (non HMO) | Admitting: Cardiovascular Disease

## 2010-09-21 DIAGNOSIS — R079 Chest pain, unspecified: Secondary | ICD-10-CM

## 2010-09-21 DIAGNOSIS — I70219 Atherosclerosis of native arteries of extremities with intermittent claudication, unspecified extremity: Secondary | ICD-10-CM

## 2010-09-23 NOTE — Medication Information (Signed)
Summary: Coumadin Clinic  Anticoagulant Therapy  Managed by: Windell Hummingbird, RN Referring MD: [name] PCP: Dr. Sherren Mocha MD: Daleen Squibb MD, Maisie Fus Indication 1: DVT Indication 2: Phlebitis/Thrombophlebitis: deep veins of upper ex Lab Used: [facility] Site: [address] INR RANGE 2 - 3  Dietary changes: no    Health status changes: no    Bleeding/hemorrhagic complications: no    Recent/future hospitalizations: no    Any changes in medication regimen? no    Recent/future dental: no  Any missed doses?: no       Is patient compliant with meds? yes       Allergies: No Known Drug Allergies  Anticoagulation Management History:      The patient is taking warfarin and comes in today for a routine follow up visit.  Negative risk factors for bleeding include an age less than 78 years old.  The bleeding index is 'low risk'.  Negative CHADS2 values include Age > 71 years old.  The start date was 08/30/2006.  His last INR was 5.8 ratio.  Anticoagulation responsible provider: Daleen Squibb MD, Maisie Fus.  Cuvette Lot#: [id number].  Exp: 08/2011.    Anticoagulation Management Assessment/Plan:      The patient's current anticoagulation dose is Warfarin sodium 10 mg tabs: Use as directed by Anticoagulation Clinic.  The target INR is 2 - 3.  The next INR is due 10/08/2010.  Anticoagulation instructions were given to patient.  Results were reviewed/authorized by Windell Hummingbird, RN.  He was notified by Windell Hummingbird, RN.         Prior Anticoagulation Instructions: INR 1.0 Today and tomorrow take 1.5 pillls then resume 1 pill everyday. Recheck in one week.   Current Anticoagulation Instructions: INR 3.2 Take 1/2 tablet tomorrow.  Then resume taking 1 tablet every day. Recheck in 3 weeks.

## 2010-09-27 ENCOUNTER — Telehealth: Payer: Self-pay | Admitting: Cardiovascular Disease

## 2010-09-29 NOTE — Assessment & Plan Note (Signed)
Summary: f1y   Visit Type:  Follow-up Primary Provider:  Dr. Drue Novel  CC:  chest  pain  -tightness in  area-pt also having some pain in his left leg.  History of Present Illness: 50 year old male presenting for followup evaluation. He has a history of occlusive lower extremity disease and required urgent embolectomy in 2008 after presenting with an ischemic left leg.   He complains of sharp, shooting left groin pains, unrelated to exertion. He denies calf pain with walking and he does a lot of walking with his job. He also complains of episodic chest tightness - this occurs with picking up heavy objects. He denies chest pain with aerobic activity such as brisk walking or climbing stairs. He complains of associated shortness of breath. Episodes last about 10 minutes and occur infrequently - they are unpredictable.  Final complaint is diffuse itching. He thinks this is from simvastatin and he discontinued this several days ago with improvement since holding this drug.  Current Medications (verified): 1)  Warfarin Sodium 10 Mg Tabs (Warfarin Sodium) .... Use As Directed By Anticoagulation Clinic 2)  Simvastatin 40 Mg Tabs (Simvastatin) .... Take One Tablet By Mouth Daily At Bedtime( Hold Per Pt)  Allergies (verified): No Known Drug Allergies  Past History:  Past medical history reviewed for relevance to current acute and chronic problems.  Past Medical History: Reviewed history from 02/12/2009 and no changes required. H/O CRITICAL LEFT LEG ISCHEMIA 08/2006      a. s/p embolectomy of the Left Femoral, Popliteal, and Tibial Arteries Edilia Bo - 08/2006)      b. known occlusion of R Popliteal with collaterals - not intervened upon      c. ABI's 03/2008: r: 1.0; L 0.91 H/O RT RENAL INFARCT H/O DVT CHEST PAIN      a. 10/05/06 - [place]. Myoview.  EF 60%, no ischemia. 9/07 NCS R CTS L MILD ULNAR NEUROPATHY ??C2 RADICULOPATHY  Review of Systems       Negative except as per HPI   Vital  Signs:  Patient profile:   50 year old male Height:      73 inches Weight:      195 pounds BMI:     25.82 Pulse rate:   87 / minute BP sitting:   124 / 72  (left arm)  Vitals Entered By: Burnett Kanaris, CNA (September 21, 2010 4:06 PM)  Physical Exam  General:  Pt is alert and oriented, African - American male, in no acute distress. HEENT: normal Neck: normal carotid upstrokes without bruits, JVP normal Lungs: CTA CV: RRR without murmur or gallop Abd: soft, NT, positive BS, no bruit, no organomegaly Ext: no clubbing, cyanosis, or edema. femoral pulses 2+=, pedal pulses 2+ right and trace left, feet are warm and well-perfused. Skin: warm and dry without rash    EKG  Procedure date:  09/21/2010  Findings:      NSR 87 bpm, within normal limits.  Impression & Recommendations:  Problem # 1:  PERIPHERAL VASCULAR DISEASE (ICD-443.9) Pt stable without claudication symptoms or signs of critical limb ischemia. Continue current Rx with long-term warfarin. Followup in one year.  Problem # 2:  HYPERLIPIDEMIA-MIXED (ICD-272.4) Pt is intolerant to simvastatin secondary to pruritis. Trial of rosuvastatin 10 mg and followup lipids and lft's in 3 months.  His updated medication list for this problem includes:    Crestor 10 Mg Tabs (Rosuvastatin calcium) .[name]... Take one tablet by mouth daily.  Orders: Nuclear Stress Test (Nuc Stress Test)  EKG w/ Interpretation (93000)  CHOL: 180 (11/24/2009)   LDL: 92 (11/24/2009)   HDL: 67.20 (11/24/2009)   TG: 105.0 (11/24/2009)  Problem # 3:  CHEST PAIN-UNSPECIFIED (ICD-786.50) Pt at high-risk for CAD with hyperlipidemia and known PAD. Recommend Lexiscan Myoview stress test to rule out significant ischemia. Pt's chest pain has typical and atypical features.  His updated medication list for this problem includes:    Warfarin Sodium 10 Mg Tabs (Warfarin sodium) ..... Use as directed by anticoagulation clinic  Orders: Nuclear Stress Test (Nuc Stress  Test) EKG w/ Interpretation (93000)  Patient Instructions: 1)  Your physician has recommended you make the following change in your medication: STOP Simvastatin, START Crestor 10mg  one by mouth at bedtime  2)  Your physician wants you to follow-up in: 1 YEAR.  You will receive a reminder letter in the mail two months in advance. If you don't receive a letter, please call our office to schedule the follow-up appointment. 3)  Your physician has requested that you have a Lexiscan myoview.  For further information please visit [email or web address]  Please follow instruction sheet, as given. 4)  Your physician recommends that you return for a FASTING LIPID and LIVER Profile in 3 MONTHS (440.21, 786.5)--nothing to eat or drink after midnight.  Prescriptions: CRESTOR 10 MG TABS (ROSUVASTATIN CALCIUM) Take one tablet by mouth daily.  #30 x 12   Entered by:   Julieta Gutting, RN, BSN   Authorized by:   Norva Karvonen, MD   Signed by:   Julieta Gutting, RN, BSN on 09/21/2010   Method used:   Electronically to        [facility] [phone]* (retail)       [address]       [place], [place]  [address]       Ph: [phone]       Fax: [phone]   RxID:   [id number]

## 2010-10-01 ENCOUNTER — Telehealth (INDEPENDENT_AMBULATORY_CARE_PROVIDER_SITE_OTHER): Payer: Self-pay | Admitting: *Deleted

## 2010-10-04 ENCOUNTER — Encounter: Payer: Self-pay | Admitting: Internal Medicine

## 2010-10-04 ENCOUNTER — Ambulatory Visit (HOSPITAL_COMMUNITY): Payer: Managed Care, Other (non HMO) | Attending: Cardiovascular Disease

## 2010-10-04 DIAGNOSIS — R0789 Other chest pain: Secondary | ICD-10-CM

## 2010-10-04 DIAGNOSIS — E785 Hyperlipidemia, unspecified: Secondary | ICD-10-CM | POA: Insufficient documentation

## 2010-10-04 DIAGNOSIS — R9431 Abnormal electrocardiogram [ECG] [EKG]: Secondary | ICD-10-CM

## 2010-10-04 DIAGNOSIS — R0602 Shortness of breath: Secondary | ICD-10-CM

## 2010-10-05 NOTE — Progress Notes (Signed)
Summary: Nuclear Pre-Procedure  Phone Note Outgoing Call Call back at [facility] Phone [phone]   Call placed by: Stanton Kidney, EMT-P,  October 01, 2010 1:15 PM Action Taken: Phone Call Completed Summary of Call: Left message with information on Myoview Information Sheet (see scanned document for details). Stanton Kidney, EMT-P  October 01, 2010 1:15 PM      Nuclear Med Background Indications for Stress Test: Evaluation for Ischemia   History: Echo, Myocardial Perfusion Study  History Comments: 1/08 Echo: EF=60% 1/08: (L) Embolectomy-FEM,POP,&Tibial arteries 1/08 MUGA: EF= 47% 7/10 MPS: NL, EF=54%  Symptoms: Chest Tightness  Symptoms Comments: (R) arm pain   Nuclear Pre-Procedure Cardiac Risk Factors: Hypertension, Lipids, Overweight, PVD, Smoker Height (in): 73  Nuclear Med Study Referring MD:  [name]

## 2010-10-05 NOTE — Progress Notes (Signed)
Summary: Cannot afford cholesterol medication  Phone Note Call from Patient Call back at Home Phone [phone]   Caller: Patient Reason for Call: Talk to Nurse, Talk to Doctor Complaint: Abdominal Pain Summary of Call: pt went to pick up new b/p med and he can't afford it so he will need something else Initial call taken by: Omer Jack,  September 27, 2010 10:13 AM  Follow-up for Phone Call        Phone Call Completed PT CAN NOT AFFORD CRESTOR PER PT $100.00 PER MONTH. PT AWARE WILL DISCUSS WITH DR Excell Seltzer AND NOTIFY VIA PHONE NEW TX PLAN. Follow-up by: Scherrie Bateman, LPN,  September 27, 2010 10:20 AM  Additional Follow-up for Phone Call Additional follow up Details #1::        pt called again to find out what Dr. Excell Seltzer wanted him to take cause he didn't get a call yesterday Omer Jack  September 28, 2010 2:37 PM     Additional Follow-up for Phone Call Additional follow up Details #2::    PER DR Breck Hollinger PT MAY TAKE PRAVASTATIN 80 MG 1 once daily. PT AWARE. SCRIPT SENT VIA EMR TO [id number] ON [address] Follow-up by: Scherrie Bateman, LPN,  September 28, 2010 3:19 PM  Additional Follow-up for Phone Call Additional follow up Details #3:: Details for Additional Follow-up Action Taken: The pt is scheduled already for follow-up lipid and liver profile on 12/07/10. Julieta Gutting, RN, BSN  September 29, 2010 5:35 PM  New/Updated Medications: PRAVASTATIN SODIUM 80 MG TABS (PRAVASTATIN SODIUM) 1 once daily Prescriptions: PRAVASTATIN SODIUM 80 MG TABS (PRAVASTATIN SODIUM) 1 once daily  #30 x 11   Entered by:   Scherrie Bateman, LPN   Authorized by:   Norva Karvonen, MD   Signed by:   Scherrie Bateman, LPN on 47/82/9562   Method used:   Electronically to        [facility] [phone]* (retail)       [address]       [place], [place]  [address]       Ph: [phone]       Fax: [phone]   RxID:   [id number]

## 2010-10-06 ENCOUNTER — Encounter: Payer: Self-pay | Admitting: Cardiovascular Disease

## 2010-10-06 DIAGNOSIS — I82409 Acute embolism and thrombosis of unspecified deep veins of unspecified lower extremity: Secondary | ICD-10-CM | POA: Insufficient documentation

## 2010-10-06 DIAGNOSIS — Z7901 Long term (current) use of anticoagulants: Secondary | ICD-10-CM

## 2010-10-06 DIAGNOSIS — N280 Ischemia and infarction of kidney: Secondary | ICD-10-CM

## 2010-10-06 HISTORY — DX: Acute embolism and thrombosis of unspecified deep veins of unspecified lower extremity: I82.409

## 2010-10-14 NOTE — Assessment & Plan Note (Addendum)
Summary: wt. 195 lexiscan r/s/272.4/saf/[employer], [employer] AVWU#[id number],...  Nuclear Med Background Indications for Stress Test: Evaluation for Ischemia, PTCA Patency   History: Echo, Myocardial Perfusion Study  History Comments: 1/08 Echo: EF=60% 1/08: (L) Embolectomy-FEM,POP,&Tibial arteries 1/08 MUGA: EF= 47% 7/10 MPS: NL, EF=54%  Symptoms: Chest Pressure, Chest Tightness, Chest Tightness with Exertion, Fatigue with Exertion, Nausea, Near Syncope  Symptoms Comments: (R) arm pain Last episode of Chest pressure and tightness was 3wks ago.   Nuclear Pre-Procedure Cardiac Risk Factors: Hypertension, Lipids, Overweight, PVD, Smoker Caffeine/Decaff Intake: None NPO After: 10:00 PM Lungs: clear IV 0.9% NS with Angio Cath: 20g     IV Site: R Antecubital IV Started by: Irean Hong, RN Chest Size (in) 44     Height (in): 73 Weight (lb): 193 BMI: 25.56  Nuclear Med Study 1 or 2 day study:  1 day     Stress Test Type:  Lexiscan Reading MD:  Dietrich Pates, MD     Referring MD:  [name] Resting Radionuclide:  Technetium 41m Tetrofosmin     Resting Radionuclide Dose:  10.9 mCi  Stress Radionuclide:  Technetium 64m Tetrofosmin     Stress Radionuclide Dose:  33 mCi   Stress Protocol      Max HR:  118 bpm Max Systolic BP: 114 mm HgRate Pressure Product:  29562  Lexiscan: 0.4 mg   Stress Test Technologist:  Cathlyn Parsons, RN     Nuclear Technologist:  Doyne Keel, CNMT  Rest Procedure  Myocardial perfusion imaging was performed at rest 45 minutes following the intravenous administration of Technetium 62m Tetrofosmin.  Stress Procedure  The patient received IV Lexiscan 0.4 mg over 15-seconds.  Patient had SOB and no chest pain. No ectopy noted. Technetium 43m Tetrofosmin injected at 30-seconds.  There were no significant changes with infusion.  Quantitative spect images were obtained after a 45 minute delay.  QPS Raw Data Images:  Stress images were motion corrected.  SOft  tissue (diaphragm, bowel activity) underlie heart. Stress Images:  Thinning with decreased counts in the inferior wall (base, mid) and apex. Rest Images:  Mild incomplete improvement inferiorly, not consistent between views. Transient Ischemic Dilatation:  1.19  (Normal <1.22)  Lung/Heart Ratio:  0.31  (Normal <0.45)  Quantitative Gated Spect Images QGS EDV:  113 ml QGS ESV:  56 ml QGS EF:  51 % QGS cine images:  Visually LVEF appears to be greater.   Overall Impression  Exercise Capacity: Lexiscan with no exercise. BP Response: Normal blood pressure response. Clinical Symptoms: No chest pain ECG Impression: No significant change beyond baseline. Overall Impression Comments: Probable normal perfusion and soft tissue attenuation (diaphragm)  Minimal improvement inferiorly does not appear significant for ischemia. LVEF calculated at 51%.  Visually appears better.  Appended Document: wt. 195 lexiscan r/s/272.4/saf/[employer], [employer] ZHYQ#[id number],... Low-risk nuclear study - continue medical management.  Appended Document: wt. 195 lexiscan r/s/272.4/saf/[employer], [employer] XBMW#[id number],... Pt aware of results by phone.

## 2010-12-01 ENCOUNTER — Observation Stay (HOSPITAL_COMMUNITY)
Admission: EM | Admit: 2010-12-01 | Discharge: 2010-12-02 | DRG: 313 | Disposition: A | Discharge status: Home / Self Care | Payer: Managed Care, Other (non HMO) | Source: Ambulatory Visit | Attending: Cardiovascular Disease | Admitting: Cardiovascular Disease

## 2010-12-01 ENCOUNTER — Emergency Department (HOSPITAL_COMMUNITY): Payer: Managed Care, Other (non HMO)

## 2010-12-01 DIAGNOSIS — Z7901 Long term (current) use of anticoagulants: Secondary | ICD-10-CM

## 2010-12-01 DIAGNOSIS — I70209 Unspecified atherosclerosis of native arteries of extremities, unspecified extremity: Secondary | ICD-10-CM | POA: Diagnosis present

## 2010-12-01 DIAGNOSIS — E785 Hyperlipidemia, unspecified: Secondary | ICD-10-CM | POA: Diagnosis present

## 2010-12-01 DIAGNOSIS — R079 Chest pain, unspecified: Secondary | ICD-10-CM

## 2010-12-01 DIAGNOSIS — R0789 Other chest pain: Principal | ICD-10-CM | POA: Diagnosis present

## 2010-12-01 DIAGNOSIS — Z7982 Long term (current) use of aspirin: Secondary | ICD-10-CM

## 2010-12-01 DIAGNOSIS — Z86718 Personal history of other venous thrombosis and embolism: Secondary | ICD-10-CM

## 2010-12-01 LAB — RAPID URINE DRUG SCREEN, HOSP PERFORMED
Amphetamines: NOT DETECTED
Barbiturates: NOT DETECTED
Tetrahydrocannabinol: POSITIVE — AB

## 2010-12-01 LAB — CK TOTAL AND CKMB (NOT AT ARMC)
CK, MB: 2 ng/mL (ref 0.3–4.0)
Relative Index: 0.7 (ref 0.0–2.5)
Total CK: 273 U/L — ABNORMAL HIGH (ref 7–232)

## 2010-12-01 LAB — CBC
MCHC: 33.6 g/dL (ref 30.0–36.0)
Platelets: 229 10*3/uL (ref 150–400)

## 2010-12-01 LAB — POCT CARDIAC MARKERS
CKMB, poc: <1 ng/mL — ABNORMAL LOW (ref 1.0–8.0)
Troponin i, poc: <0.05 ng/mL (ref 0.00–0.09)

## 2010-12-01 LAB — URINALYSIS, ROUTINE W REFLEX MICROSCOPIC
Bilirubin Urine: NEGATIVE
Hgb urine dipstick: NEGATIVE
Ketones, ur: 15 mg/dL — AB

## 2010-12-01 LAB — BASIC METABOLIC PANEL
BUN: 12 mg/dL (ref 6–23)
CO2: 23 mEq/L (ref 19–32)
Chloride: 107 mEq/L (ref 96–112)
Creatinine, Ser: 0.94 mg/dL (ref 0.4–1.5)
GFR calc non Af Amer: >60 mL/min (ref >60)
Glucose, Bld: 99 mg/dL (ref 70–99)
Sodium: 140 mEq/L (ref 135–145)

## 2010-12-01 LAB — DIFFERENTIAL
Basophils Relative: 0 % (ref 0–1)
Eosinophils Absolute: 0.1 10*3/uL (ref 0.0–0.7)
Eosinophils Relative: 2 % (ref 0–5)

## 2010-12-02 DIAGNOSIS — R079 Chest pain, unspecified: Secondary | ICD-10-CM

## 2010-12-02 LAB — PROTIME-INR: Prothrombin Time: 25.6 seconds — ABNORMAL HIGH (ref 11.6–15.2)

## 2010-12-02 LAB — BASIC METABOLIC PANEL
Calcium: 9.4 mg/dL (ref 8.4–10.5)
Creatinine, Ser: 1.06 mg/dL (ref 0.4–1.5)
GFR calc Af Amer: >60 mL/min (ref >60)
GFR calc non Af Amer: >60 mL/min (ref >60)
Sodium: 142 mEq/L (ref 135–145)

## 2010-12-02 LAB — CARDIAC PANEL(CRET KIN+CKTOT+MB+TROPI)
CK, MB: 1.7 ng/mL (ref 0.3–4.0)
CK, MB: 1.4 ng/mL (ref 0.3–4.0)
Relative Index: 0.8 (ref 0.0–2.5)
Total CK: 224 U/L (ref 7–232)
Total CK: 210 U/L (ref 7–232)

## 2010-12-02 LAB — CBC
Platelets: 214 10*3/uL (ref 150–400)
RBC: 4.6 MIL/uL (ref 4.22–5.81)
RDW: 14.6 % (ref 11.5–15.5)
WBC: 5.4 10*3/uL (ref 4.0–10.5)

## 2010-12-07 ENCOUNTER — Inpatient Hospital Stay (HOSPITAL_BASED_OUTPATIENT_CLINIC_OR_DEPARTMENT_OTHER)
Admission: RE | Admit: 2010-12-07 | Discharge: 2010-12-07 | Disposition: A | Discharge status: Home / Self Care | Payer: Managed Care, Other (non HMO) | Source: Ambulatory Visit | Attending: Cardiovascular Disease | Admitting: Cardiovascular Disease

## 2010-12-07 ENCOUNTER — Encounter: Payer: Self-pay | Admitting: *Deleted

## 2010-12-07 ENCOUNTER — Ambulatory Visit (INDEPENDENT_AMBULATORY_CARE_PROVIDER_SITE_OTHER): Payer: Managed Care, Other (non HMO) | Admitting: *Deleted

## 2010-12-07 ENCOUNTER — Other Ambulatory Visit (INDEPENDENT_AMBULATORY_CARE_PROVIDER_SITE_OTHER): Payer: Managed Care, Other (non HMO) | Admitting: *Deleted

## 2010-12-07 DIAGNOSIS — R079 Chest pain, unspecified: Secondary | ICD-10-CM | POA: Insufficient documentation

## 2010-12-07 DIAGNOSIS — Z7901 Long term (current) use of anticoagulants: Secondary | ICD-10-CM

## 2010-12-07 DIAGNOSIS — I82409 Acute embolism and thrombosis of unspecified deep veins of unspecified lower extremity: Secondary | ICD-10-CM

## 2010-12-07 DIAGNOSIS — I70219 Atherosclerosis of native arteries of extremities with intermittent claudication, unspecified extremity: Secondary | ICD-10-CM

## 2010-12-07 DIAGNOSIS — I739 Peripheral vascular disease, unspecified: Secondary | ICD-10-CM | POA: Insufficient documentation

## 2010-12-07 LAB — HEPATIC FUNCTION PANEL
Albumin: 3.8 g/dL (ref 3.5–5.2)
Alkaline Phosphatase: 58 U/L (ref 39–117)
Bilirubin, Direct: 0.1 mg/dL (ref 0.0–0.3)
Total Protein: 6.5 g/dL (ref 6.0–8.3)

## 2010-12-07 LAB — LIPID PANEL
LDL Cholesterol: 110 mg/dL — ABNORMAL HIGH (ref 0–99)
Total CHOL/HDL Ratio: 3
Triglycerides: 89 mg/dL (ref 0.0–149.0)
VLDL: 17.8 mg/dL (ref 0.0–40.0)

## 2010-12-07 NOTE — Progress Notes (Signed)
RN s/w Pt to advise him of INR recheck. Pt will come to CVRR clinic to have it rechecked. RN received results and faxed to Aurea Graff at [facility] lab. RN s/w Jpan to confirm receipt of results.

## 2010-12-07 NOTE — Telephone Encounter (Signed)
This encounter was created in error - please disregard.

## 2010-12-09 NOTE — Cardiovascular Report (Signed)
  NAMEANDRZEJ, SCULLY            ACCOUNT NO.:  [id number]  MEDICAL RECORD NO.:  [id number]           PATIENT TYPE:  LOCATION:                                 FACILITY:  PHYSICIAN:  Kale Rondeau. Excell Seltzer, MD  DATE OF BIRTH:  10/30/1960  DATE OF PROCEDURE: DATE OF DISCHARGE:                           CARDIAC CATHETERIZATION   PROCEDURE:  Left heart catheterization, selective coronary angiography, left ventriculography.  PROCEDURAL INDICATIONS:  Mr. Loiseau is a 50 year old African American gentleman with peripheral arterial disease.  He has extensive PAD and is maintained on long-term Coumadin.  He is presented with recurrent chest pain.  Stress testing in the past has been negative for ischemia.  He has had another recent hospital admission with chest pain consistent with both typical and atypical features.  He was referred for diagnostic catheterization to rule out obstructive CAD as a cause of his chest pain.  Risks and indications of procedure were reviewed with the patient. Informed consent was obtained.  The right wrist was prepped, draped and anesthetized with 1% lidocaine.  Using modified Seldinger technique, a 5- French sheath was placed in the right radial artery, 4000 units of unfractionated heparin was given intravenously and 3 mg of verapamil was administered through the sheath.  Standard Judkins catheters were used for coronary angiography and left ventriculography.  The patient tolerated the procedure well.  All catheter exchanges were performed over guidewire.  There were no immediate complications.  Right coronary artery:  Large, dominant vessel without obstructive disease.  There is a large PDA and to moderate posterolateral branches.  Left mainstem.  Left mainstem is widely patent.  It divides into the LAD and left circumflex.  LAD.  The LAD is of large caliber.  There is minor irregularity, but no significant stenosis throughout.  There is a large first  diagonal branches that divides into twin vessels.  Left circumflex:  Circumflex is of moderate caliber.  There are 3 obtuse marginal branches, all are widely patent.  FINAL ASSESSMENT: 1. No significant obstructive coronary artery disease. 2. Normal left ventricular systolic function.  RECOMMENDATIONS:  We ill reassure the patient regarding his coronary status and recommend continued risk factor modification.     Veverly Fells. Excell Seltzer, MD     MDC/MEDQ  D:  12/07/2010  T:  12/08/2010  Job:  [id number]  Electronically Signed by [name] MD on 12/09/2010 10:40:09 AM

## 2010-12-14 ENCOUNTER — Encounter: Payer: Managed Care, Other (non HMO) | Admitting: *Deleted

## 2010-12-21 NOTE — Progress Notes (Signed)
[facility]                        PERIPHERAL VASCULAR OFFICE NOTE   Anthony, Anthony                   MRN:          [id number]  DATE:03/22/2007                            DOB:          12-11-1960    [name] returns for followup at the [facility]  [facility] on March 22, 2007.  He is a 50 year old gentleman well known to  me, who I initially saw in January with critical limb ischemia involving  the left leg.  He ultimately underwent embolectomy of the left femoral,  popliteal, and tibial vessels by Dr. Edilia Bo.  Symptomatically, he has  done relatively well since surgery.  He has had some degree of  claudication but this occurs with moderate level activity.  He is able  to function with his job.  Unfortunately, [name] has continued to  smoke.  He denies chest pain, dyspnea, orthopnea, PND, lightheadedness,  or syncope.  He really has not had any cardiac symptoms to speak of.  He  notes that he has gained some weight.  He has been drinking more alcohol  at nighttime.   CURRENT MEDICINES:  1. Coumadin.  2. Aspirin 81 mg daily.  3. Percocet as needed for pain.   ALLERGIES:  NKDA.   PHYSICAL EXAMINATION:  GENERAL:  He is alert and oriented in no acute  distress.  VITAL SIGNS:  Blood pressure is 114/80, heart rate is 72, weight is 217  pounds.  HEENT:  Normal.  NECK:  Normal carotid upstrokes without bruits.  Jugular venous pressure  is normal.  LUNGS:  Clear to auscultation bilaterally.  HEART:  Regular rate and rhythm without murmurs or gallops.  ABDOMEN:  Soft, nontender.  No organomegaly.  No abdominal bruits.  EXTREMITIES:  There is no clubbing or cyanosis.  There is no edema of te  right leg.  There is mild edema of the left lower extremity.  He has  palpable femoral pulses bilaterally.  His right sided pedal pulses are  palpable on the left side.  His feet are warm.   ASSESSMENT:  1. [name] is  currently stable from a cardiovascular standpoint.      He has mild claudication but does not have any current evidence of      critical limb ischemia.  From a vascular standpoint, he will be      followed by Dr. Edilia Bo.  2. Regarding his hypercoagulable state, he should remain on Coumadin.      Of note, he has had past deep vein thrombosis as well as arterial      thromboses.  His serologic evaluation has been negative for a      specific hypercoagulable state but this certainly has been      clinically manifest.  As he is following regularly with Dr.      Edilia Bo, I will plan on seeing him in followup on a yearly basis to      asses his cardiac status.  3. He has had borderline left ventricular function by some assessment      but I performed a stress Myoview study on February  28th that showed      a calculated ejection fraction of 60%      with no sign of scar or ischemia.  No therapy is warranted at this      point.  I discussed at length with him today the importance of      tobacco cessation and alcohol reduction.     [name]. Anthony Seltzer, MD  Electronically Signed    MDC/MedQ  DD: 03/22/2007  DT: 03/23/2007  Job #: [id number]   cc:   Di Kindle. Edilia Bo, M.D.

## 2010-12-21 NOTE — Assessment & Plan Note (Signed)
[facility]                            CARDIOLOGY OFFICE NOTE   BRACH, BIRDSALL                   MRN:          [id number]  DATE:08/19/2008                            DOB:          10/18/1960    REASON FOR VISIT:  Peripheral arterial disease.   HISTORY OF PRESENT ILLNESS:  Mr. Debrosse is a 50 year old gentleman who  presented with critical limb ischemia in the left leg and he underwent  an embolectomy of the left femoral popliteal and tibial vessels by Dr.  Edilia Bo approximately 2 years ago.  He has no claudication symptoms at  present.  He complained the left hip pain that is worse with lying on  his left side.  He denies chest pain, dyspnea, orthopnea, PND, or edema.  He continues to smoke cigarettes.   MEDICATIONS:  Coumadin as directed.   ALLERGIES:  NKDA.   PHYSICAL EXAMINATION:  GENERAL:  The patient is alert and oriented.  No  acute distress.  VITAL SIGNS: Weight is 200 pounds, blood pressure is 110/80 on the  right, 118/88 on the left, heart rate is 80, respiratory rate is 12.  HEENT:  Normal.  NECK:  Normal carotid upstrokes.  No bruits.  JVP normal.  LUNGS:  Clear bilaterally.  HEART:  Regular rate and rhythm.  No murmurs or gallops.  ABDOMEN:  Soft, nontender.  No organomegaly.  EXTREMITIES:  Femoral, dorsalis pedis, and posterior tibial pulses are  2+ bilaterally.  There is no edema.  SKIN:  Warm and dry without rash.   ABIs performed on August 25 are greater than 1.0 on the right and 0.91  on the left.  There are triphasic waveforms bilaterally.   ASSESSMENT:  This is a 50 year old gentleman with lower extremity  peripheral arterial disease.  He underwent an evaluation for cardiac  source of embolism at the time of his initial presentation and there was  no source identified.  He also has a history of deep venous thrombosis  and likely has a hypercoagulable state.  Recommend continuation of long-  term Coumadin.  He  had a negative serologic evaluation for  hypercoagulability.  Recommend checking a lipid panel to try to reduce  his overall cardiovascular risk in the setting of his known  peripheral arterial disease.  This will be scheduled in the next week.  The patient will follow up next year.     Veverly Fells. Excell Seltzer, MD  Electronically Signed    MDC/MedQ  DD: 08/19/2008  DT: 08/20/2008  Job #: [id number]   cc:   [name], MD  Di Kindle. Edilia Bo, M.D.

## 2010-12-21 NOTE — Assessment & Plan Note (Signed)
OFFICE VISIT   [name], [name]  DOB:  1960/09/20                                       03/13/2007  BJYNW#:[id number]   I saw the patient for followup of his peripheral vascular disease.  He  had presented with popliteal artery emboli.  It was not clear whether or  not these were acute or chronic.  He underwent a left femoral  embolectomy with vein patch angioplasty of the left femoral artery, and  a left popliteal and fibula embolectomy with vein patch angioplasty to  the left below-knee popliteal artery.  It was noted that he had chronic  occlusions of the left posterior tibial and anterior tibial arteries.  There was relatively fresh clot in the popliteal artery which was  retrieved.  He comes in for a 6 month followup visit.  His main  complaint is some tingling and burning pain in his feet, mostly on the  left side.  He really does not describe any rest pain in the foot, and  he has had no history of non-healing wounds.  He does have some mild  claudication on the left.  Unfortunately, he is continuing to smoke.   REVIEW OF SYSTEMS:  He has had no chest pain, chest pressure,  palpitations, or arrhythmias.He has had no bronchitis, asthma, or  wheezing.   PHYSICAL EXAMINATION:  Blood pressure is 124/89, heart rate is 70.  I  did not detect any carotid bruits.  Lungs:  Clear bilaterally to  auscultation.  On cardiac exam he has a regular rate and rhythm.  Abdomen:  Soft and nontender.  He has palpable femoral pulses and a  palpable right popliteal, posterior tibial, and dorsalis pedis pulse.  On the left side he has a palpable popliteal pulse.  I cannot palpate  pedal pulses.  He has monophasic Doppler signals on the left foot.  On  the right side he has biphasic Doppler signals.Doppler study in our  office today shows an ABI of 93% on the right and 81% on the left which  is stable.I reassured him that I think his circulatory status is stable.  He  has a hypercoagulable state and he remains on Coumadin.  We have had  a long discussion about the importance of tobacco cessation, and I have  written him a prescription for nicotine patches at a dose of 14 mg an  hour.  He is due for another followup study in February of next year.  I  will see him back at that time.  He knows to call sooner if he has  problems.  I have also encouraged him to stay as active as possible.   Di Kindle. Edilia Bo, M.D.  Electronically Signed   CSD/MEDQ  D:  03/13/2007  T:  03/14/2007  Job:  [id number]

## 2010-12-21 NOTE — Assessment & Plan Note (Signed)
[facility]                               LIPID CLINIC NOTE   BEN, HABERMANN                   MRN:          [id number]  DATE:12/28/2006                            DOB:          1961/07/14    Mr. Agyeman is a very pleasant patient in the anticoagulation clinic  followed by Dr. Excell Seltzer who is maintained on life long anticoagulation  therapy status post DVT remotely and in the recent past popliteal artery  occlusion bilaterally requiring embolectomy by Dr. Cari Caraway and a  combination of antiplatelet/anticoagulant therapy since that time. Mr.  Stamos is feeling and doing well. He is scheduled for a vasectomy with  Dr. Annabell Howells. This procedure is scheduled in June. Due to the significant  clot burden he experienced, I will ask the patient to discontinue his  Coumadin 5 days prior to the planned procedure and then when his INR is  at or below 2.0, I will initiate him on 1 mg per kg subcutaneously based  on body weight of Lovenox therapy twice daily. His last dose will be  given 24 hours prior to the planned procedure. Postoperatively we will  ask the patient to resume his Coumadin the night of the procedure or as  quickly as possible thereafter based on Dr. Belva Crome expertise and  recommendation. The patient's last renal function assessment was on  August 25, 2006 at [facility] which was 1.07 which calculates  to an appropriate creatinine clearance for the patient to be maintained  on twice daily Lovenox therapy during his treatment. The patient has  been apprised of this by telephone today at 4:00. This note will be  copied to Dr. Belva Crome office at [facility], telephone [phone].      Shelby Dubin, PharmD, BCPS, CPP  Electronically Signed      Veverly Fells. Excell Seltzer, MD  Electronically Signed   MP/MedQ  DD: 12/29/2006  DT: 12/29/2006  Job #: [id number]   cc:   Excell Seltzer. Annabell Howells, M.D.  Veverly Fells. Excell Seltzer, MD

## 2010-12-21 NOTE — Assessment & Plan Note (Signed)
[facility]                               LIPID CLINIC NOTE   RENNY, REMER                   MRN:          [id number]  DATE:04/18/2007                            DOB:          Feb 02, 1961    Mr. Iannone is seen in the anticoagulation clinic today.  He describes  onset in the past 48 hours of some chest discomfort associated with  deeper breathing on the job.  He, as we speak, has had no nausea, he has  had no vomiting outside of his normal parameters.  He has had some  continued discomfort that is not really effected by position or place.  He is able to have the pain stop when he stops breathing deeply.  He has  also not been able to reproduce pain.   PAST MEDICAL HISTORY:  1. Peripheral vascular disease and mild claudication.  2. History of DVT and arterial thromboses as well.  3. Borderline left ventricular function.  4. Tobacco abuse.  5. Alcohol use.   CURRENT MEDICATIONS:  1. Coumadin as directed by the anticoagulation clinic.  2. Aspirin 81 mg daily.  3. Percocet as needed for pain.   DRUG ALLERGIES:  None are noted.   REVIEW OF SYSTEMS:  As stated in the HPI and otherwise negative.   EKG is obtained, and its results show normal sinus rhythm, heart rate in  the 80s, unchanged from last EKG obtained in August 2008.   I have discussed the case thoroughly with Dr. Excell Seltzer, and upon  inspiration on examination of the lungs, it is clear that the patient is  able to have pain reproduced through deep breathing exercises.  He has  had no sputum that was indicative of problem, but he is mindful of this  and will continue to watch.  Therefore, at this time, we believe the  patient is demonstrating signs and symptoms of an upper viral  respiratory illness.  He will continue his current medicines.  He will  add salt water nose spray and increase his fluids in an attempt to  improve his symptomatic relief.  He will also potentially  select from a  variety of non-sedating antihistamines to assist with discontinuing some  of his drainage.  He understands that with questions or problems he is  to call to his primary care physician, Dr. Drue Novel, and if symptoms worsen  or change, or if he were to develop any cardiac-like chest pain, he  would go immediately to the emergency department without delay and  travel by EMS.  Time spent with the patient is 40 minutes.   SUPERVISING PHYSICIAN:  Chucky Homes.      Shelby Dubin, PharmD, BCPS, CPP  Electronically Signed      Luis Abed, MD, [facility]  Electronically Signed   MP/MedQ  DD: 04/20/2007  DT: 04/21/2007  Job #: [id number]   cc:   Veverly Fells. Excell Seltzer, MD

## 2010-12-21 NOTE — Procedures (Signed)
BYPASS GRAFT EVALUATION   INDICATION:  Follow-up evaluation, status post embolectomy.  Patient  complains of three months of left thigh pain.   HISTORY:  Diabetes:  No.  Cardiac:  No.  Hypertension:  No.  Smoking:  Yes.  Previous Surgery:  Left femoral embolectomy with vein patch angioplasty  and left popliteal artery embolectomy with vein patch angioplasty on  08/24/06 by Dr. Edilia Bo.   SINGLE LEVEL ARTERIAL EXAM                               RIGHT              LEFT  Brachial:                    119                128  Anterior tibial:             130                116  Posterior tibial:            138                99  Peroneal:  Ankle/brachial index:        >1.0               0.91   PREVIOUS ABI:  Date: 09/18/07  RIGHT:  0.99  LEFT:  0.87   LOWER EXTREMITY BYPASS GRAFT DUPLEX EXAM:   DUPLEX:  Doppler arterial waveforms are triphasic throughout the left  common femoral, superficial femoral, popliteal artery, and tibioperoneal  trunk.  No evidence of restenosis at embolectomy sites.   IMPRESSION:  1. Ankle brachial indices are stable from previous study bilaterally.  2. No evidence of restenosis at femoral or popliteal embolectomy      sites.   ___________________________________________  Di Kindle. Edilia Bo, M.D.   MC/MEDQ  D:  04/01/2008  T:  04/01/2008  Job:  [id number]

## 2010-12-23 NOTE — H&P (Signed)
NAMESAJAD, [name]            ACCOUNT NO.:  [id number]  MEDICAL RECORD NO.:  [id number]           PATIENT TYPE:  I  LOCATION:  3728                         FACILITY:  [facility]  PHYSICIAN:  Ali Mohl. Excell Seltzer, MD  DATE OF BIRTH:  01-Nov-1960  DATE OF ADMISSION:  12/01/2010 DATE OF DISCHARGE:                             HISTORY & PHYSICAL   PRIMARY CARDIOLOGIST:  Veverly Fells. Excell Seltzer, MD  PRIMARY CARE PROVIDER:  [name], MD  CHIEF COMPLAINT:  Chest pain.  HISTORY OF PRESENT ILLNESS:  This is a 50 year old African American gentleman with history of occlusive lower extremity disease requiring urgent embolectomy in 2008 after presenting with an ischemic left leg. The patient has also history of hyperlipidemia as well as intermittent chest pain.  The patient was followed up in the outpatient clinic by Dr. Excell Seltzer on September 21, 2010 with complaints of chest pain that had atypical as well as typical features.  With the patient's high risk for coronary artery disease with known peripheral artery disease and hyperlipidemia, it was recommended a followup with Lexiscan Myoview to rule out significant ischemia.  The patient underwent Steffanie Dunn on October 19, 2010.  This test did show low risk and it was noted that the patient should be continued on medical management.  Since that time, the patient has had several episodes of chest pain.  His worst episode was this morning.  After climbing 3 flights of stairs at work, the patient began to experience retrosternal chest tightness with associated shortness of breath, lightheadedness, nausea, and extremity tingling. The patient's pain resolved after 20-30 minutes and rest.  He then went down 3 flights of stairs to talk to his boss and again had a recurrence of chest pain.  EMS was called at this time and he was given aspirin as well as sublingual nitroglycerin with relief.  The patient was brought to [facility] and is currently chest pain free.   His initial point-of-care markers were negative x1.  His EKG is without acute changes.  The patient denies any recent fevers or chills.  He does endorse vomiting in the morning, this is chronic.  He denies any hemoptysis. The patient is currently hemodynamically stable.  Cardiology was asked to admit the patient for further evaluation.  PAST MEDICAL HISTORY: 1. PAD with history of critical left leg ischemia in 2008.     a.     Status post embolectomy of the left femoral, popliteal, and      tibial arteries, treated by Dr. Durwin Nora.     b.     Known occlusion of the right popliteal with collaterals, not      intervened upon. 2. History of right renal infarct. 3. History of DVT. 4. Hyperlipidemia. 5. History of chest pain status post Myoview x2 with the last Myoview     being October 19, 2010, this was low risk. 6. Status post cystectomy.  SOCIAL HISTORY:  The patient lives in [place].  He is currently separated with two daughters.  He works at [employer].  He has a history of tobacco abuse, he continues to smoke 1 to 1-1/2 packs a day.  He occasionally uses alcohol.  He has a history of cocaine abuse in the past but currently denies this.  The urine drug screen is positive for marijuana.  FAMILY HISTORY:  Noncontributory for premature coronary artery disease.  ALLERGIES:  No known drug allergies.  MEDICATIONS: 1. Pravachol 80 mg daily. 2. Coumadin as directed.  The patient has compliance with home medication.  REVIEW OF SYSTEMS:  All pertinent positives and negatives as stated in HPI as well as symptoms of claudication.  Other systems have been reviewed and are negative.  PHYSICAL EXAMINATION:  VITAL SIGNS:  Temperature 98.9, pulse 89, respirations 19, blood pressure 123/86, and O2 saturation 96% on room air. GENERAL:  This is a well-developed, well-nourished middle-aged gentleman.  He is in no acute distress. HEENT:  Normal. NECK:  Supple without bruit or JVD. HEART:   Regular rate and rhythm with S1-S2.  No murmur, rub, or gallop noted.  Pulses 2+ and equal bilaterally. LUNGS:  Clear to auscultation bilaterally without wheezes, rales, or rhonchi. ABDOMEN:  Soft, nontender, positive bowel sounds x4. EXTREMITIES:  No clubbing, cyanosis, or edema. MUSCULOSKELETAL:  No joint deformities or effusions. NEURO:  Alert and oriented x3.  Cranial nerves II through XII grossly intact.  IMAGING:  Chest x-ray showing peripheral right mid lung subpleural scarring with no acute cardiopulmonary process.  EKG showing normal sinus rhythm at a rate of 87 beats per minute.  Axis is normal. Intervals are normal.  There are no acute ST-T wave changes.  LABORATORY DATA:  WBC 5.1, hemoglobin 14.1, hematocrit 42, and platelets 229.  Sodium 140, potassium 3.7, chloride 107, bicarb 23, BUN 12, and creatinine 0.94.  INR 3.14.  Point-of-care markers negative x1.  Urine drug screen positive for THC.  ASSESSMENT AND PLAN:  This is a 50 year old African American gentleman with a history of peripheral vascular disease as well as continued tobacco abuse who presents with severe chest pain after exertion.  The patient's symptoms are concerning for unstable angina.  The patient's EKG is normal without acute changes.  His initial point-of-care markers were negative x1.  The patient is currently without chest pain.  At this time, we will plan to admit the patient and rule out for myocardial infarction by cycling cardiac markers as well as EKGs.  With the patient's recent low risk Myoview, the patient will need catheterization at some point, but currently the patient's INR is supratherapeutic at 3.14.  We will hold Coumadin during admission and discuss catheterization as an inpatient versus an outpatient after cardiac markers have been cycled.  We will currently add a low-dose beta- blocker as well as continue his pravastatin and add aspirin at this time.     Leonette Monarch,  PA-C   ______________________________ Veverly Fells. Excell Seltzer, MD    NB/MEDQ  D:  12/01/2010  T:  12/02/2010  Job:  [id number]  Electronically Signed by Alen Blew P.A. on 12/09/2010 02:58:35 PM Electronically Signed by [name] MD on 12/23/2010 02:59:59 PM

## 2010-12-23 NOTE — Discharge Summary (Signed)
[name], [name]            ACCOUNT NO.:  [id number]  MEDICAL RECORD NO.:  [id number]           PATIENT TYPE:  I  LOCATION:  3728                         FACILITY:  [facility]  PHYSICIAN:  Hyder Deman. Excell Seltzer, MD  DATE OF BIRTH:  [birth year]  DATE OF ADMISSION:  12/01/2010 DATE OF DISCHARGE:  12/02/2010                              DISCHARGE SUMMARY   PRIMARY CARDIOLOGIST:  Veverly Fells. Excell Seltzer, MD  PRIMARY CARE PROVIDER:  [name], MD.  DISCHARGE DIAGNOSIS:  Chest pain with typical and atypical features, ruled out for myocardial infarction at this admission.  SECONDARY DIAGNOSES: 1. Peripheral artery disease with history of critical left leg     ischemia in 2008.     a.     Status post embolectomy in the left femoral, popliteal, and      tibial arteries.     b.     Known occlusion of the right popliteal collaterals, not      intervened upon. 2. History of right renal infarct. 3. History of deep vein thrombosis. 4. Hyperlipidemia.  ALLERGIES:  No known drug allergies.  PROCEDURE/DIAGNOSTICS PERFORMED DURING HOSPITALIZATION:  Chest x-ray showing peripheral right mid lung subpleural scarring, but without acute cardiopulmonary process.  REASON FOR HOSPITALIZATION:  This is a 50 year old gentleman with the above-stated problem list, who has been having intermittent chest pressure over the last year.  On the day of admission, the patient had the worst episode yet.  His chest pain was exertional, after climbing 3 flights of stairs he had retrosternal chest tightness associated with shortness of breath, lightheadedness, nausea, and extremity tingling. He came to the emergency department.  EKG showed no acute changes with early repolarization.  Initial point-of-care markers were negative x1. The patient was admitted for rule out of myocardial infarction.  Of note, the patient's INR was drawn and he was supratherapeutic at 3.14. His Coumadin has been held.  A low-dose beta-blocker  as well as aspirin was added to his medical regimen.  He was continued on his pravastatin.  HOSPITAL COURSE:  The patient was admitted to telemetry and ruled out for myocardial infarction with cardiac enzymes being negative x3.  He had complaints of chest soreness throughout the night, but without acute EKG changes.  With the patient's chest pain having typical and atypical features, but no objective evidence of ischemia and normal enzymes as well as no acute changes on EKG, it was recommended that the patient be discharged home until his INR is below 1.8 per catheterization.  Today, the patient's INR is 2.3.  He has been scheduled to return to the [facility] lab for outpatient catheterization on Tuesday at 12:30.  He will hold Coumadin until this time.  He will be continued on his aspirin, pravastatin, and metoprolol.  On the day of discharge, Dr. Excell Seltzer evaluated the patient and noted him stable for home.  Discharge instructions as well as followup catheterization have been discussed with the patient and he voiced understanding.  DISCHARGE LABS:  Cardiac enzymes negative x3.  Sodium 142, potassium 4, BUN 12, creatinine 1.06.  INR 2.32.  WBC 5.4, hemoglobin 14.3, hematocrit 42.3, platelets 214.  DISCHARGE MEDICATIONS: 1. The patient will hold Coumadin until after catheterization. 2. Aspirin 81 mg 1 tablet daily. 3. Metoprolol tartrate 25 mg half tablet twice daily. 4. Pravachol 80 mg daily.  DISCHARGE PLANS AND INSTRUCTIONS: 1. The patient will return for outpatient cardiac catheterization on     May 1 at 12:30, the patient is to arrive at 11:30.  He is not to     eat or drink after midnight, the night prior to his procedure.  He     is to hold his Coumadin until after cardiac catheterization.  The     morning of the procedure, he may use a small amount of water to     swallow his medication. 2. The patient is to remain off work until after cardiac     catheterization. 3. The patient  is to increase activity slowly, and to stop any     activity that causes chest pain or shortness of breast. 4. The patient is to continue low-sodium heart-healthy diet. 5. The patient is to call the office in the interim for any problems     or concerns.  DURATION OF DISCHARGE:  Greater than 30 minutes with physician and physician extender time.     Leonette Monarch, PA-C   ______________________________ Veverly Fells. Excell Seltzer, MD    NB/MEDQ  D:  12/02/2010  T:  12/02/2010  Job:  [id number]  cc:   [name], MD  Electronically Signed by Alen Blew P.A. on 12/09/2010 02:58:26 PM Electronically Signed by [name] MD on 12/23/2010 02:59:48 PM

## 2010-12-24 NOTE — Procedures (Signed)
[facility]                             NUCLEAR IMAGING STUDY   NAME:MCLENDONHartwell, Vandiver                   MRN:          [id number]  DATE:09/06/2006                            DOB:          [birth year]    REST MUGA:  The patient is a 50 year old male who was recently admitted with  multiple emboli.  There is a discrepancy in ejection fraction between  transesophageal echocardiogram and transthoracic echocardiogram.  This  study is performed to further quantitate.   The patient's red blood cells were labeled using the ultra tag method  with 33 mCi of technetium-99 labeled pertechnetate.  The images were  reconstructed in the anterior, lateral and left anterior oblique views.  The left ventricular ejection fraction was calculated at 47%.  Review of  the wall motion revealed inferoseptal hypokinesis.   IMPRESSION:  Rest MUGA with a calculated ejection fraction of 47%.  Review of the wall motion revealed inferoseptal hypokinesis.     Madolyn Frieze Jens Som, MD, [facility]  Electronically Signed    BSC/MedQ  DD: 09/07/2006  DT: 09/07/2006  Job #: [id number]   cc:   Veverly Fells. Excell Seltzer, MD  [name], MD

## 2010-12-24 NOTE — Consult Note (Signed)
[place]  Patient:    [name], [name]                   MRN: [id number] Proc. Date: 09/16/00 Adm. Date:  [id number] Attending:  Ephriam Knuckles H CC:         Dr. 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  The patient states he has had a right upper quadrant abdominal pain for six months.  It is very difficult to get a clear history what this pain has been like over time.  He says for the last five days the pain has been constant. He describes it as a sharp pain and when it is at its worst it is a 9, now it is a 6.  It radiates to his back.  He states he has some nausea, but he cannot be specific about when that occurs.  He says he has occasional vomiting, but his wife says he vomits every other day and she attributes that to alcohol excess.  He has no diarrhea, change in bowel habits, etc.  The patient was seen by Dr. Lutricia Horsfall at the [facility] office on September 14, 2000 for evaluation of his problem.  At that time the exam was negative. Laboratory data was done.  He was set up for an ultrasound of his gallbladder on Monday.  He called today saying that his pain was unbearable and that he needed some pain pills called in.  I explained to the patient that if his pain was more than Tylenol would help he would need to be seen in the emergency room for evaluation.  PAST MEDICAL HISTORY:  He has been hospitalized for T&A and left wrist surgery.  Past illnesses - none.  Injuries - none.  DRUG ALLERGIES:  None.  CURRENT MEDICATIONS:  Prevacid 30 q.d.  He has been on that for a month.  Dr. Baldo Daub put him on that because of "indigestion."  SOCIAL HISTORY:  He smokes a pack of cigarettes a day.  He drinks 6-12 beers per day.  Occupation:  He is an unemployed Social worker.  He is  on unemployment right now.  He also admits to being a cocaine addict.  FAMILY HISTORY:  Mother is in her 63s - hypertension and depression.  Father is in his 42s - hyperlipidemia and hypertension.  No brothers, no sisters. Negative family history of GI disease.  PHYSICAL EXAMINATION:  VITAL SIGNS:  Vital signs are stable and he is afebrile.  GENERAL:  He is a well-developed, well-nourished, black male in no acute distress.  ABDOMEN:  The abdomen was not distended.  Bowel sounds were normal.  There was no tenderness in the abdomen to palpation.  RECTAL:  Rectum normal.  Stool guaiac negative.  The patient states he has passed blood before, but it comes and goes.  LABORATORY DATA:  Hemoglobin 15.7.  Normal white count.  Normal differential.  Metabolic panel shows normal electrolytes, BUN, creatinine, and LFTs.  The amylase and the lipase are normal.  Ultrasound of his gallbladder pending.  Urine normal.  Urine drug screen pending.  IMPRESSION:  1. Right upper quadrant pain, etiology unknown.  Plan:  Once ultrasound is     obtained, then we will decide what to do.  If ultrasound shows gallstones,     then he will  need emergent surgical evaluation.  If, however, gallbladder     ultrasound is normal, then we will refer to GI for further diagnostic     evaluation as outpatient next week.  2. Alcoholism.  3. Drug addiction/cocaine.  4. Tobacco addiction.  The patient was advised that he must stop drinking.  He must stop smoking and he is not to use any drugs.  He is to continue the Prevacid as directed by Dr. Baldo Daub, but to double the dose to 30 b.i.d.  He is also to take Tylenol for pain, and I gave him a prescription for 30 Darvocet-N 100s to take 1-2 q.4-6h. p.r.n. for pain and no refills. DD:  09/16/00 TD:  09/18/00 Job: [id number]

## 2010-12-24 NOTE — Discharge Summary (Signed)
[name], [name]            ACCOUNT NO.:  [id number]   MEDICAL RECORD NO.:  [id number]          PATIENT TYPE:  INP   LOCATION:  2039                         FACILITY:  [facility]   PHYSICIAN:  Learta Codding, MD,FACC DATE OF BIRTH:  [birth year]   DATE OF ADMISSION:  08/23/2006  DATE OF DISCHARGE:  09/03/2006                               DISCHARGE SUMMARY   PROCEDURES:  1. Left femoral embolectomy with vein patch angioplasty of the left      femoral artery.  2. Left popliteal and tibial embolectomy with vein patch angioplasty      of the left below knee popliteal artery, extending onto the tibial      peroneal trunk.  3. Intraoperative arteriogram.  4. Arch aortography.  5. Abdominal aortography.  6. Angiography of the right leg versus ipsilateral common femoral      arterial sheath access.  7. Angiography of the left leg via placement of a left external iliac      catheter from contralateral approach.   PRIMARY DIAGNOSES:  1. 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  2. Relatively fresh clot in the left popliteal artery.  3. Right popliteal artery emboli.   SECONDARY DIAGNOSES:  1. Ongoing tobacco use.  2. Remote history of phlebitis/deep venous thrombosis, Coumadin course      completed.  3. Hypercholesterolemia.  4. Status post cholecystectomy.  5. Status post tonsillectomy.  6. History of left wrist surgery in 2000.   TIME AT DISCHARGE:  35 minutes.   HOSPITAL COURSE:  [name] is a 50 year old male with no previous  history of coronary artery disease, although he has a history of  phlebitis and DVT.  He was having pain in his left foot that he felt was  fairly sudden in onset, that he had for about 8 to 10 days.  He was  evaluated in the office and came in for an outpatient arteriogram.  He  had bilateral popliteal emboli, and he was admitted to the hospital for  anticoagulation, and a surgical consult  was called.   [name] was seen by Dr. Durwin Anthony, who recommended exploration of the  femoral artery on the left, as his symptoms had increased fairly  abruptly, and Dr. Durwin Anthony felt that he had most likely an acute event on  the left side.  He was taken to the OR on August 24, 2006.   He had left femoral embolectomy and left popliteal and tibial  embolectomy, as well as intraoperative arteriogram.  Postoperatively, he  had __________ monophasic posterior tibial and peroneal signals by  Doppler.  Dr. Durwin Anthony did not see any further options for  revascularization, as he felt that the disease of the tibials was  chronic.  A clot that was felt to be acute was retrieved from the  popliteal artery.  He tolerated the procedure well.  It was felt that  the posterior tibial artery had excellent flow on the right, and the  clock on that side was likely chronic.  As he was essentially  asymptomatic  on the right, no further intervention was indicated at this  time.   Postoperatively, he was started on heparin, and had Coumadin added to  his medication regimen.  It was felt that he needed full overlap with  Heparin to Coumadin.  His left foot pain resolved postoperatively,  although he still had some right foot pain with ambulation.  A TEE was  recommended to exclude an intracardiac source of embolism.  The TEE  showed an EF of 60% with no wall motion abnormalities.  The RV was  normal.  In the atrial septum, there was a small PFO with minimal right-  to-left shunt with Valsalva.  The aortic valve was trileaflet with  trivial AI.  There was trivial MR and TR, and his aorta was normal.  There is no thrombus in his left atrial appendage.  Dr. Gala Romney  evaluated the study and felt that the small PFO was most likely not the  source of emboli.  He recommended complete coverage with Heparin until  his Coumadin was therapeutic, and he recommended lifelong Coumadin as  well.   [name] was followed  by Dr. Durwin Anthony during his hospital stay.  On a  re-check of his right leg, Dr. Durwin Anthony felt that the right popliteal  artery occlusion was chronic with extensive collaterals and  reconstitution of the right posterior tibial artery.  Dr. Durwin Anthony felt  that elective fem-tib bypass on the right was not indicated, unless his  symptoms significantly progressed.  A transthoracic echocardiogram had  been performed which showed an EF of 40%, although was 60% by transit  esophageal echocardiogram.  An outpatient MUGA is to be scheduled.  Coumadin loading was continued.   Hypercoagulable workup was performed, and there were some minor  abnormalities.  Dr. Melinda Crutch recommendation was to continue to load  Coumadin and follow up as an outpatient with hospital hematology workup  as an outpatient.   [name] continued to load Coumadin and eventually was therapeutic  with an INR of 2.2.  The staples had been removed by [facility] and Steri-  Strips applied.  On September 03, 2006, [name] was considered stable  for discharge with outpatient follow-up arranged.   DISCHARGE INSTRUCTIONS:  His activity level is to be increased  gradually.  He is cleared to shower by [facility], and as the Steri-Strips  peel up, he can peel off.  He is to follow up with Dr. Durwin Anthony, and an  appointment will be scheduled by the [facility] office.  He is to follow up  with Dr. Excell Seltzer, and he is to get a MUGA scan as well as a Coumadin  clinic appointment as an outpatient.  He is to follow up,__________ be  increased gradually.  [facility] has cleared him to shower, so that as the  Steri-Strips began to peel up, he could remove them.  He has an  outpatient MUGA scan, Coumadin clinic appointment and follow up with Dr.  Excell Seltzer arranged.  He is to follow up with Dr. Edilia Bo as well.   DISCHARGE MEDICATIONS:  1. Coumadin 10 mg a day or as directed.  2. Percocet p.r.n.  3. Aspirin 81 mg a day.     Theodore Demark, PA-C      Learta Codding,  MD,FACC  Electronically Signed    RB/MEDQ  D:  09/03/2006  T:  09/03/2006  Job:  [id number]   cc:   [name], MD  Donnie Coffin. Dixon, P.A.

## 2010-12-24 NOTE — Op Note (Signed)
NAMEPAO, HAFFEY            ACCOUNT NO.:  [id number]   MEDICAL RECORD NO.:  [id number]          PATIENT TYPE:  INP   LOCATION:  2550                         FACILITY:  [facility]   PHYSICIAN:  Di Kindle. Edilia Bo, M.D.DATE OF BIRTH:  Jan 15, 1961   DATE OF PROCEDURE:  08/24/2006  DATE OF DISCHARGE:                               OPERATIVE REPORT   PREOPERATIVE DIAGNOSIS:  Bilateral popliteal emboli with acute left  lower extremity ischemia.   POSTOPERATIVE DIAGNOSIS:  Bilateral popliteal emboli with acute left  lower extremity ischemia.   PROCEDURE:  1. Left femoral embolectomy with vein patch angioplasty of the left      femoral artery.  2. Left popliteal and tibial embolectomy, with vein patch angioplasty      of the left below-knee popliteal artery -- extending onto the      tibial peroneal trunk.  3. Intraoperative arteriogram.   SURGEON:  Di Kindle. Edilia Bo, M.D..   ASSISTANT:  Zadie Rhine, PA.   ANESTHESIA:  General   FINDINGS:  1. 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  2. There was relatively fresh clot in the left popliteal artery, which      was retrieved.   COMPLICATIONS:  A small tip of the Fogarty catheter was lodged in the  distal left deep femoral artery, and a small tip of this broke off and  was left in place distally -- where it should not cause problems  clinically.   ESTIMATED BLOOD LOSS:  150 cc.   INDICATIONS:  This is a 50 year old gentleman who had some intermittent  symptoms in his left foot with paresthesias, and noted that the left was  cool.  This been on for several months; however, 8-10 days ago he  developed sudden increase in his symptoms and was seen yesterday in the  [place] office and set up for an arteriogram.  Arteriogram today  suggested bilateral popliteal artery emboli; it was difficult to say if  these were acute or chronic, given that the patient had some  collaterals  bilaterally.  However, again, the acute increase in symptoms on the  left; it was felt that exploration was warranted.  The procedure and  potential complications had been discussed with the patient  preoperatively.  All of his questions were answered and he was agreeable  to proceed.   TECHNIQUE:  The patient was taken to the operating room and received a  general anesthetic.  Both groins and both lower extremities were prepped  and draped in usual sterile fashion.  A longitudinal incision was made  in the left groin and the common femoral, superficial femoral and deep  femoral arteries were all dissected free and controlled with vessel  loops.  The femoral artery was soft with no significant plaque present.  The vessels were controlled and the patient was then heparinized.  Through the same incision, the saphenofemoral junction was dissected  free and the saphenous vein dissected free in the proximal thigh.  This  was in anticipation of using the vein for a vein patch.  Next, after the  arteries were controlled.  A longitudinal arteriotomy was made in the  common femoral artery.  The #4 Fogarty catheter was passed proximally  and no proximal clot was retrieved.  The catheter was then passed down  the popliteal artery and it would pass to approximately the level of the  knee, and some organized clot was retrieved through the femoral  embolectomy.  There was also a question of whether or not there was some  acute or chronic clot in the distal deep femoral artery on the left.  I  passed a #3 Fogarty catheter, which would only go about 8 cm.  I  therefore used a smaller #4 Fogarty catheter, which went approximately  15 cm; however, this became lodged distally.  I dissected quite low down  on the deep femoral artery, but despite multiple attempts was unable to  fully retrieve the catheter.  Ultimately the tip of the catheter broke  off and this was probably down 15 cm into the  deep femoral artery.  I  did not think this was clinically significant and thought trying to  retrieve this by dissecting down onto the distal deep femoral artery  would cause more problems than do any good.  There was excellent  backbleeding through the deep femoral artery and no clot was retrieved  from the deep femoral artery.  As I could clearly not to obtain all of  the clot from the femoral approach, I elected to do a below-knee  popliteal artery exposure.  A segment of the saphenous vein was ligated  proximally and distally and opened longitudinally.  The vein patch was  then sewn onto the common femoral artery, using continuous 6-0 Prolene  suture.  Prior to completing this closure, the proximal artery was  flushed and there was excellent inflow.  The deep femoral artery and  superficial femoral artery were back bled, there was excellent  backbleeding.  The anastomosis was completed and flow was reestablished  first to the deep femoral artery and then to the superficial femoral  artery.  Next, through a longitudinal incision, the below-knee popliteal  artery was exposed.  I divided the soleus muscle and divided the  anterior tibial vein to allow exposure of the takeoff of the anterior  tibial artery.  The tibioperoneal trunk, posterior tibial and peroneal  arteries were identified.  The tourniquet was then placed on the upper  thigh, and the patient received an additional 3000 units of IV heparin.  A longitudinal arteriotomy was made in the distal popliteal artery, and  this was extended onto the tibioperoneal trunk.  Down into the  tibioperoneal trunk and proximal anterior tibial artery there was  chronic clot, and I had to use an endarterectomy spatula to try to  retrieve this.  I attempted to pass the 3 and 4 Fogarty catheters down  the anterior tibial and posterior tibial arteries, but these would only go about 10 cm.  These appeared to be chronically occluded.  The  peroneal  artery was small and diseased, but appeared to be patent.  There was fairly relatively fresh clot in the below-knee popliteal  artery, and I was able to retrieve this using a #4 Fogarty catheter.  Once all the clot was retrieved a new segment of vein was opened  longitudinally and used as a vein patch.  The vein patch was sewn on the  distal popliteal artery, extending onto the tibioperoneal trunk.  Prior  to completing this closure  the arteries were back bled and flushed  appropriately and the anastomosis completed.  Flow was then  reestablished in the leg.  Intraoperative arteriogram was obtained,  which did show the diseased peroneal artery with occlusion of the  posterior tibial artery and anterior tibial artery as anticipated.  At  this point there was a dampened monophasic posterior tibial and peroneal  signal at the Doppler.  I really did not see any further options for  revascularization, as I think the patient likely had chronic disease of  the tibials.  I think the acute event was likely related to clot down  into the popliteal artery, and this was all retrieved.  Hemostasis was  obtained in the wounds of the groin, and a 19 Blake drain was placed in  the below-knee incision and also in the groin incision.  Both wounds  were closed with a deep layer of 3-0 Vicryl.  The subcutaneous layer was  closed with 3-0 Vicryl and the skin closed with staples.  I reviewed the  films again on the right side, and there were more extensive collaterals  in the right side, suggesting that the popliteal artery occlusion on the  right was chronic.  There was excellent flow in the posterior tibial  artery with the Doppler on the right, and I did not think we would  significantly impact the circulation on the right -- as I think this  clot was likely chronic.  If this patient has symptoms later, then he  could potentially have a popliteal to posterior tibial bypass; but  currently he had no new  symptoms on the right side and was essentially  asymptomatic on the right.  Given the findings on the left, I suspect  that most of the disease on the right was chronic. I therefore elected  not to  explore the right popliteal artery.  The patient had sterile dressings  applied on the incisions in the left leg.  He tolerated the procedure  well and was transferred to the recovery room in satisfactory condition.  All needle and sponge counts were correct.      Di Kindle. Edilia Bo, M.D.  Electronically Signed     CSD/MEDQ  D:  08/24/2006  T:  08/24/2006  Job:  [id number]

## 2010-12-24 NOTE — Consult Note (Signed)
[name], [name]            ACCOUNT NO.:  [id number]   MEDICAL RECORD NO.:  [id number]          PATIENT TYPE:  INP   LOCATION:  2550                         FACILITY:  [facility]   PHYSICIAN:  Di Kindle. Edilia Bo, M.D.DATE OF BIRTH:  November 24, 1960   DATE OF CONSULTATION:  08/24/2006  DATE OF DISCHARGE:                                 CONSULTATION   REASON FOR CONSULTATION:  Bilateral popliteal emboli with possible acute  arterial occlusion of left lower extremity.   HISTORY:  This is a 50 year old gentleman who approximately 8-10 days  ago developed a fairly sudden onset of pain in his left foot.  Prior to  this for several months he had been having some intermittent symptoms in  his left foot but did not really describe any significant claudication,  rest pain, or non-healing ulcers.  The symptoms in the left foot  persisted and he was evaluated yesterday in the [place] office and set  up for a diagnostic arteriogram today.  Dr. Samule Ohm performed an  arteriogram and it looked like he had bilateral popliteal artery emboli,  although it was difficult to say if these were acute or chronic,  however, given the increased symptoms in the left leg, vascular surgery  was consulted for further recommendations concerning a possible acute  arterial occlusion.  Of note, the patient states that he has had some  paresthesias in the left foot for the last 4-5 months and the foot has  also been somewhat cool.  When his symptoms had begun initially, he also  had some mild lower abdominal pain which had resolved.  He has had no  significant symptoms on the right side.   PAST MEDICAL HISTORY:  Significant for:  1. Hypercholesterolemia.  2. In addition he had a history of phlebitis and DVT in the past.  He      had been on Coumadin for 10 months.  3. He denies any history of diabetes, hypertension, history of      previous myocardial infarction, history of congestive heart      failure, history of  COPD.   ALLERGIES:  NO KNOWN DRUG ALLERGIES.   MEDICATIONS:  None.   FAMILY HISTORY:  His mother had phlebitis and his been on  anticoagulation in the past.  He also had a daughter who apparently had  a pulmonary embolus.  He is unaware of any history of premature  cardiovascular disease.   SOCIAL HISTORY:  He is married and he has two daughters.  He smokes a  pack per day of cigarettes and has been smoking for 5-6 years.   REVIEW OF SYSTEMS:  GENERAL:  He has had no recent weight loss, weight  gain, problems with his appetite.  CARDIAC:  He has had no chest pain,  chest pressure, palpitations or arrhythmias.  PULMONARY:  He does admit  to some mild dyspnea on exertion.  He has had no bronchitis, asthma or  wheezing.  GI:  He has had no recent change in his bowel habits and has  had no history of peptic ulcer disease.  GU:  He had no dysuria or  frequency.  NEUROLOGIC:  He has had no dizziness, blackouts, headaches  or seizures.  HEMATOLOGIC:  He has had blood clots in the past and has a  strong family history of clotting issues.  ORTHO/SKIN:  He has had no  joint pain or arthritis.   PHYSICAL EXAMINATION:  VITAL SIGNS:  Blood pressure is 116/68, heart  rate is 68.  He is afebrile.  HEENT:  There is no cervical lymphadenopathy.  There are no carotid  bruits.  LUNGS:  Clear bilaterally to auscultation.  CARDIAC:  He has a regular rate and rhythm.  ABDOMEN:  Soft and nontender.  I cannot palpate an aneurysm.  He has  palpable femoral pulses.  EXTREMITIES:  I cannot palpate popliteal or pedal pulses.  He has a  barely audible left posterior tibial signal at the Doppler.  Foot is  slightly cool in the left.  On the right side, he has a fairly  reasonable posterior tibial signal with the Doppler.  He has no ischemic  ulcers on his foot.   I have reviewed his arteriogram with Dr. Samule Ohm.  He appears to have a  filling defect in the distal left deep femoral artery which could be   possibly acute clot or chronic clot.  Of note, the aorta was assessed  from the heart all the way down and there was no proximal aortic disease  which would explain an embolic event to the lower extremities.  He has  an abrupt occlusion of both popliteal arteries at the level of the knee  on the right with extensive collaterals suggesting this is likely  chronic.  There is reconstitution of the posterior tibial artery on the  right.  There is severe tibial disease.  On the left side, again, there  is severe tibial disease and collaterals to suggest that at least some  of these issues have been chronic.   IMPRESSION:  This patient has evidence of bilateral popliteal artery  emboli on his arteriogram.  Of note, given the collaterals noted it is  difficult to say whether or not this is acute or chronic.  However, on  the left side he has had an increase in his symptoms in the left fairly  abruptly and I suspect he has had some acute event on the left 8-10 days  ago which explains his symptoms.   I have recommended exploration of the femoral artery to hopefully  retrieve clot from the deep femoral artery and also from the popliteal  artery.  He will likely require exposure to the below knee popliteal  artery to try to also assess the tibials.  Pending on the findings on  the left side, we may consider popliteal embolectomy on the right also.  I have discussed the indications for the procedure and the potential  complications including the risk of limb loss.  All his questions were  answered.  He is agreeable to proceed.      Di Kindle. Edilia Bo, M.D.  Electronically Signed     CSD/MEDQ  D:  08/24/2006  T:  08/24/2006  Job:  [id number]

## 2010-12-24 NOTE — Op Note (Signed)
NAMEFARAZ, PONCIANO            ACCOUNT NO.:  [id number]   MEDICAL RECORD NO.:  [id number]          PATIENT TYPE:  INP   LOCATION:  2039                         FACILITY:  [facility]   PHYSICIAN:  Salvadore Farber, MD  DATE OF BIRTH:  1960-10-07   DATE OF PROCEDURE:  08/24/2006  DATE OF DISCHARGE:                               OPERATIVE REPORT   PROCEDURE:  Arch aortography, abdominal aortography, angiography of the  right leg via ipsilateral common femoral arterial sheath access,  angiography of the left leg via placement of a left external iliac  catheter from contralateral approach.   INDICATIONS:  [name] is a 50 year old gentleman with a past  medical history remarkable for prior deep venous thrombosis and renal  infarct in the distant past.  He has a daughter with two episodes of  venous thrombosis as well.  He presents having had short-distance  claudication 6 months ago which subsequently improved to longer-distance  claudication.  He then developed rest pain in the left foot two nights  ago.  The rest pain has resolved, but he is left with very short-  distance claudication.  He has normal motor and sensory function.  He  was admitted to the hospital and anticoagulated.  I was asked Dr. Excell Seltzer  to perform angiography.   PROCEDURAL TECHNIQUE:  Informed consent was obtained.  Under 1%  lidocaine local anesthesia, a 5-French sheath was placed in the right  common femoral artery using the modified Seldinger technique.  A pigtail  catheter was advanced to the ascending aorta.  Arch aortography was  performed by power injection, looking for source of embolus.  Pigtail  catheter was then pulled back to the suprarenal abdominal aorta.  Abdominal aortography was performed by power injection.  I then used a  crossover catheter to advance it to engage the left common iliac artery.  Angiography was performed by power injection, with imaging of the pelvic  vessels.  I then  exchanged over a Wholey wire for an end-hole catheter  which I positioned in the left external iliac artery.  Angiography of  the left leg was performed by power injection using digital subtraction  and step-table technique.   I then removed the end-hole catheter and performed angiography of the  right leg via injection of the right common femoral arterial sheath  using digital subtraction and step-table technique.  The patient  tolerated the procedure well and was transferred to the holding room in  stable condition.   COMPLICATIONS:  None.   FINDINGS:  1. Aortic arch:  Normal aortic arch, with no evidence of      atherosclerosis.  There were normal origins of the great vessels.  2. Abdominal aorta:  Angiographically normal, without aneurysm or      evidence of atherosclerosis.  3. Renal arteries:  Two vessels on the right, and one on the left.      They are angiographically normal.  4. Right leg:  Normal common iliac, internal iliac, external iliac,      common femoral, and profunda.  The SFA appears corrugated,      consistent  with spasm.  However, there is no evidence of      atherosclerotic plaquing.  The popliteal artery is occluded above      the knee.  This occlusion extends for a long segment with fairly      minuscule collaterals that supply the distal posterior tibial and      peroneal.  The anterior tibial is occluded throughout its course.  5. Left leg:  Normal common iliac, external iliac, and common femoral      arteries.  The internal iliac is severely narrowed.  The profunda      has thrombus within a major branch.  The SFA is again corrugated,      consistent with spasm.  There is not, however, any evidence of      atherosclerosis.  The popliteal is occluded well above the knee.      There is reconstitution of the peroneal and posterior tibial.  The      posterior tibial, however, again occludes above the ankle.  The      anterior tibial is occluded throughout its  length.   IMPRESSION/RECOMMENDATIONS:  Apparent thrombotic occlusions of both  popliteal arteries, with limited runoff bilaterally.  Runoff is single  vessel on the left and two vessel on the right.  There is additionally  thrombus within the left profunda.   The source of thrombus is unclear.  There is no evidence of aneurysm  formation and no evidence of atherosclerosis.  Collateral pattern is not  particularly suggestive of Buerger's disease.   I reviewed films with Dr. Waverly Ferrari.  We feel that he would be  better served by an open operative thrombectomy rather than endovascular  techniques.  Dr. Edilia Bo is planning on taking him to the operating room  this morning.      Salvadore Farber, MD  Electronically Signed     WED/MEDQ  D:  08/28/2006  T:  08/28/2006  Job:  [id number]   cc:   Veverly Fells. Excell Seltzer, MD  [name], MD

## 2010-12-24 NOTE — Assessment & Plan Note (Signed)
[facility]                            CARDIOLOGY OFFICE NOTE   Anthony, Anthony                   MRN:          [id number]  DATE:09/21/2006                            DOB:          March 19, 1961    [name] returns to the clinic today for outpatient follow up after  his recent hospital admission to [facility].  He was initially  seen here on January 16th because of left leg and foot pain.  Just prior  to his visit he had a lower arterial duplex scan that demonstrated the  appearance of thrombus and subacute occlusion of his left distal  superficial femoral artery.  He was subsequently admitted and treated  with intravenous heparin followed by an angiogram the next day which was  performed by Dr. Samule Ohm.  His angiogram demonstrated thrombotic  occlusions of both popliteal arteries with limited bilateral runoff.  Other studies were performed to evaluate for a source of embolus and no  obvious source was found.  That same day the patient went to the  operating room as Dr. Edilia Bo performed embolectomy of the left femoral,  popliteal and tibial vessels.  [name] had a relatively uneventful  recovery and has been doing well since his return home.   He is back to walking on a regular basis and he denies any discomfort.  He does feel tired at the end of the day but has not had leg pain with  activity.  He has not had chest pain, shortness of breath or any other  acute complaints.   CURRENT MEDICINES:  Aspirin, Coumadin and Percocet as needed.   ALLERGIES:  NKDA.   PHYSICAL EXAMINATION:  The patient is alert and oriented.  He is in no  acute distress.  VITAL SIGNS:  The patient's weight is 220 pounds, blood pressure is  111/75, heart rate is 95, respiratory rate 16.  HEENT:  Normal.  NECK:  Normal carotid upstrokes without bruits.  Jugular venous pressure  is normal.  LUNGS:  Clear to auscultation bilaterally.  HEART:  Regular  rate and rhythm without murmurs, gallops.  ABDOMEN:  Soft, nontender, no organomegaly.  EXTREMITIES:  The left pretibial region has trace edema.  There is a  well healing surgical scar from his recent embolectomy.  There is no  clubbing or cyanosis present.   STUDIES PERFORMED:  A rest MUGA scan demonstrated an ejection fraction  of 47% with inferoseptal hypokinesis.  A transesophageal echocardiogram  demonstrated normal left ventricular function with an EF of 60%, trivial  aortic insufficiency and as small PFO.  Cholesterol panel demonstrated a  total cholesterol of 181, an HDL of 42 and an LDL of 115.   ASSESSMENT:  [name] is a 50 year old gentleman with severe  peripheral arterial disease.  He is status post left femoral, popliteal  and tibial embolectomy by Dr. Edilia Bo.  There has been no obvious source  for thromboembolic disease identified.  He has been initiated on  Coumadin, as he has had multiple thrombotic problems in the past  including a remote deep vein thrombosis, a renal infarction and  now his  arterial occlusive event.  I would recommend life-long antiplatelet  therapy with 81 mg aspirin as well as life-long warfarin therapy.   The other issue for [name] is that of a discrepancy in his left  ventricular ejection fraction.  He had a surface echocardiogram that  demonstrated a low ejection fraction.  His transesophageal  echocardiogram suggested a normal ejection fraction and so a MUGA scan  was ordered that demonstrated a low ejection fraction of 43% with  inferior hypokinesis.  I have recommended an adenosine Myoview study to  rule out any significant ischemia.  Since [name] does not have any  cardiac symptoms, I would not be inclined to do any more invasive  studies such as a cardiac catheterization unless he has a large  reversible defect on his adenosine Myoview.   In the meantime, would recommend continuing his current therapy.  Will  follow up  with him after the results of his stress study are back.     Veverly Fells. Excell Seltzer, MD  Electronically Signed    MDC/MedQ  DD: 09/21/2006  DT: 09/21/2006  Job #: [id number]   cc:   [name], MD

## 2010-12-24 NOTE — Progress Notes (Signed)
[facility]                        PERIPHERAL VASCULAR OFFICE NOTE   NAME:MCLENDONShun, Pletz                   MRN:          [id number]  DATE:08/23/2006                            DOB:          [birth year]    CHIEF COMPLAINT:  Left leg and foot pain.   HISTORY OF PRESENT ILLNESS:  Mr. Sooy is a very nice 50 year old man  who has had left leg pain for several months. He describes typical  claudication symptoms involving his left calf and foot for this time  period. He had received an extensive workup for nonvascular issues and  his pain actually resolved approximately 1 month ago. He then describes  the development of acute back pain approximately 1 week ago and then  severe pain in the left calf and foot. He has had ongoing foot and calf  pain now for the last few days. He now describes bilateral pain with  approximately 20 feet of walking. He has left leg and foot pain even at  rest. His toes on the left side also feel cool and numb.   He has no other complaints at this time. He specifically denies chest  pain, dyspnea, light-headedness, syncope or other cardiac issues. He has  a history of venous thrombosis approximately 10 years ago and was on  Warfarin for less than 1 year. He has had no recurrence.   PAST MEDICAL HISTORY:  1. DVT apparently unprovoked. Coumadin at the time but no ongoing      treatment.  2. Polysubstance abuse.  3. Question of renal infarct remotely.   MEDICATIONS:  Aspirin daily.   ALLERGIES:  NKDA.   FAMILY HISTORY:  The patient's daughter had a DVT and a pulmonary  embolus. He has a mother who has had phlebitis. There is no coronary  disease in the family.   SOCIAL HISTORY:  The patient works as a Firefighter. He is married  and has 2 children. He last used cocaine approximately 1 month ago. He  smokes marijuana. He smokes cigarettes approximately 1 pack per day for  many years. He drinks alcohol  approximately every other day.   REVIEW OF SYSTEMS:  All systems were reviewed and are negative except as  described above. Specifically the patient has never had a stroke or TIA.   PHYSICAL EXAMINATION:  GENERAL:  The patient is alert and oriented, he  is in no acute distress.  VITAL SIGNS:  Weight is 210 pounds, blood pressure 110/80, heart rate  70, respirations 12.  HEENT:  Normal.  NECK:  Normal. Carotid upstrokes without bruits. Jugular venous pressure  is normal. No thyromegaly or thyroid nodules.  LUNGS:  Clear to auscultation bilaterally.  HEART:  Regular rate and rhythm without murmurs or gallops.  ABDOMEN:  Soft, nontender, no organomegaly.  EXTREMITIES:  There is no clubbing, cyanosis or edema. The femoral  pulses are 2+ without bruits bilaterally. The right popliteal pulse is  2+, the left popliteal pulse is not palpable. The right posterior tibial  pulse is easily obtainable via Doppler. The right dorsalis pedis pulse  is not obtainable by Doppler. On the left  side, the toes are cool. The  left posterior tibial pulse is faint by Doppler, the left dorsalis pedis  pulse is not obtainable by Doppler. There are some calluses on the  plantar aspect of the right foot. There are no foot ulcers.  NEUROLOGIC:  Alert and oriented to all spheres. Cranial nerves II-XII  are intact. Strength is 5/5 and equal in the arms and legs bilaterally.  Of note, foot strength both dorsiflexion and plantar flexion is normal  bilaterally.   EKG demonstrates normal sinus rhythm and is within normal limits.   Lower arterial duplex scan demonstrates an ABI of 0.4 on the left and  0.6 on the right. The patient has no significant plaque formation in  either his common femoral or superficial femoral arteries bilaterally.  The right tibial artery has occlusive disease. On the left, the distal  SFA is occluded with echo appearance of hypoechoic thrombus suggesting  subacute occlusion.   ASSESSMENT:   Mr. Gunn is a 50 year old gentleman with likely  longstanding atherosclerotic lower extremity arterial occlusive disease  who presents with subacute occlusion of his left superficial femoral  artery. He has symptoms of critical limb ischemia with an ischemic foot  with a cool foot and diminished pulses. Recommended hospital admission  and anticoagulation with heparin. Will continue him on aspirin. He will  undergo a diagnostic angiogram tomorrow to determine whether he is a  candidate for either surgical or percutaneous revascularization. If he  develops any foot weakness or other progressive symptoms, we will study  him acutely.   In the setting of his renal infarct, venous thrombosis in the past and  family history of venous thromboses, it would be reasonable to do a  prothrombotic evaluation.     Veverly Fells. Excell Seltzer, MD  Electronically Signed    MDC/MedQ  DD: 08/23/2006  DT: 08/24/2006  Job #: [id number]   cc:   [name], MD

## 2011-02-15 ENCOUNTER — Ambulatory Visit (INDEPENDENT_AMBULATORY_CARE_PROVIDER_SITE_OTHER): Payer: Managed Care, Other (non HMO) | Admitting: *Deleted

## 2011-02-15 DIAGNOSIS — Z7901 Long term (current) use of anticoagulants: Secondary | ICD-10-CM

## 2011-02-15 DIAGNOSIS — I82409 Acute embolism and thrombosis of unspecified deep veins of unspecified lower extremity: Secondary | ICD-10-CM

## 2011-03-08 ENCOUNTER — Encounter: Payer: Managed Care, Other (non HMO) | Admitting: *Deleted

## 2011-03-10 ENCOUNTER — Ambulatory Visit (INDEPENDENT_AMBULATORY_CARE_PROVIDER_SITE_OTHER): Payer: Managed Care, Other (non HMO) | Admitting: *Deleted

## 2011-03-10 DIAGNOSIS — I82409 Acute embolism and thrombosis of unspecified deep veins of unspecified lower extremity: Secondary | ICD-10-CM

## 2011-03-10 DIAGNOSIS — Z7901 Long term (current) use of anticoagulants: Secondary | ICD-10-CM

## 2011-03-10 LAB — POCT INR: INR: 2.4

## 2011-03-17 ENCOUNTER — Telehealth: Payer: Self-pay | Admitting: Pharmacist

## 2011-03-17 NOTE — Telephone Encounter (Signed)
Message copied by Velda Shell on Thu Mar 17, 2011  9:46 AM ------      Message from: [name]      Created: Sun Mar 13, 2011 12:44 PM       thx Kennon Rounds. I'm fine with him starting chantix.      ----- Message -----         From: Mariane Masters, PHARMD         Sent: 03/10/2011   3:45 PM           To: Micheline Chapman, MD            Pt came in for Coumadin appt today.  Complaining of pain in his foot similar to what he had prior to surgery.  He is still smoking so we discussed smoking cessation being major treatment of PVD.  He says he is ready to quit and actually requested Chantix.  We discussed various smoking cessation options and he was still in favor of Chantix.  I told him I would send you a message to see if you agreed. Please let us know if you are okay with starting this. Thanks!

## 2011-03-21 MED ORDER — VARENICLINE TARTRATE 0.5 MG X 11 & 1 MG X 42 PO MISC: ORAL | Status: AC

## 2011-03-21 MED ORDER — VARENICLINE TARTRATE 0.5 MG X 11 & 1 MG X 42 PO MISC: ORAL | Status: DC

## 2011-03-21 NOTE — Telephone Encounter (Signed)
Spoke with pt.  He still wants to try Chantix.  Rx sent to pharmacy.  Will call with any problems or after first month for refill.

## 2011-03-31 ENCOUNTER — Encounter: Payer: Managed Care, Other (non HMO) | Admitting: *Deleted

## 2011-06-17 ENCOUNTER — Other Ambulatory Visit: Payer: Self-pay | Admitting: Cardiovascular Disease

## 2011-06-20 ENCOUNTER — Encounter: Payer: Managed Care, Other (non HMO) | Admitting: *Deleted

## 2011-06-20 ENCOUNTER — Other Ambulatory Visit: Payer: Self-pay | Admitting: *Deleted

## 2011-07-01 ENCOUNTER — Other Ambulatory Visit: Payer: Self-pay | Admitting: Cardiovascular Disease

## 2011-07-04 NOTE — Telephone Encounter (Signed)
rx request 

## 2011-07-05 ENCOUNTER — Ambulatory Visit (INDEPENDENT_AMBULATORY_CARE_PROVIDER_SITE_OTHER): Payer: Managed Care, Other (non HMO) | Admitting: *Deleted

## 2011-07-05 DIAGNOSIS — I82409 Acute embolism and thrombosis of unspecified deep veins of unspecified lower extremity: Secondary | ICD-10-CM

## 2011-07-05 DIAGNOSIS — Z7901 Long term (current) use of anticoagulants: Secondary | ICD-10-CM

## 2011-07-05 LAB — POCT INR: INR: 2

## 2011-07-05 MED ORDER — WARFARIN SODIUM 10 MG PO TABS: 10.0000 mg | ORAL_TABLET | ORAL | Status: DC

## 2011-07-06 ENCOUNTER — Ambulatory Visit (INDEPENDENT_AMBULATORY_CARE_PROVIDER_SITE_OTHER): Payer: Managed Care, Other (non HMO) | Admitting: Physician Assistant

## 2011-07-06 ENCOUNTER — Encounter: Payer: Self-pay | Admitting: Physician Assistant

## 2011-07-06 VITALS — BP 102/78 | HR 84 | Ht 73.0 in | Wt 199.8 lb

## 2011-07-06 DIAGNOSIS — E785 Hyperlipidemia, unspecified: Secondary | ICD-10-CM

## 2011-07-06 DIAGNOSIS — R079 Chest pain, unspecified: Secondary | ICD-10-CM

## 2011-07-06 DIAGNOSIS — R0602 Shortness of breath: Secondary | ICD-10-CM

## 2011-07-06 MED ORDER — ALBUTEROL SULFATE HFA 108 (90 BASE) MCG/ACT IN AERS: INHALATION_SPRAY | RESPIRATORY_TRACT | Status: DC

## 2011-07-06 MED ORDER — FAMOTIDINE 20 MG PO TABS: 20.0000 mg | ORAL_TABLET | Freq: Two times a day (BID) | ORAL | Status: DC

## 2011-07-06 NOTE — Assessment & Plan Note (Signed)
He is taking pravastatin and Crestor for unclear reasons.  Stop pravastatin and check L/L in 6 weeks.

## 2011-07-06 NOTE — Assessment & Plan Note (Signed)
Atypical.  Luckily, recent LHC with no significant CAD.  He is on coumadin with a therapeutic INR.  Therefore, the likelihood of thromboembolic disease causing his symptoms is unlikely.  Differential includes GERD, chest pain from COPD or MSK symptoms.  He has symptoms that are suggestive of COPD and he has a 30 pack year history of smoking.  Obtain PFTs with [facility].  I will also obtain an Echo to assess right heart pressures.  I will give him albuterol to use PRN.  If findings are significantly abnormal, he will need to see pulmonary.  I will also put him on Pepcid to cover for GERD.  He does complain of a lot of indigestion and he has a significant h/o ETOH use.  Follow up with Dr. [name] or me in 4 weeks.

## 2011-07-06 NOTE — Progress Notes (Signed)
History of Present Illness: PCP:  Dr. Drue Novel Primary Cardiologist:  Dr. [name]   [name] is a 50 y.o. male who was added on to my schedule today after a visit to the coumadin clinic yesterday.  He has been complaining of chest pain.  He has a h/o PAD with occlusive LE arterial disease requiring urgent embolectomy in 2008 after presenting with an ischemic left leg.  He has remained on coumadin.  He has a h/o chronic chest pain.  He has been evaluated with several nuclear studies.  Last Myoview 2/12: EF 51%, prob diaph attenuation, no significant ischemia, low risk.  He was admitted in 5/12 with recurrent chest pain and was taken for [facility] which demonstrated minor LAD irregularities but no significant obstructive CAD; EF was normal.    He continues to have the same left sided chest tightness with radiation to his shoulder blade.  He has had this for one to 2 years.  It is worse with positional changes and carrying heavy items.  He is slightly dyspneic and notes associated nausea.  No diaphoresis, syncope or near syncope.  No pleuritic pain.  No association with meals.  INR yesterday was 2.0.  He had a worse episode yesterday at work while carrying 2 heavy car batteries.  He told the RN in the coumadin clinic and he was put on my schedule today.    Past Medical History  Diagnosis Date  . PAD (peripheral artery disease)     a. left leg ischemia tx wtih embolectomy of left fem, pop, tib arteries with Dr. Edilia Bo - 08/2006;  b. known occlusion of right pop with collats - med Rx;  c.ABI's 8/09: R 1.0; L 0.91   . Renal infarct   . History of DVT of lower extremity   . Chest pain     LHC 5/12; minimal LAD irregs; no obs CAD; normal LVF    Current Outpatient Prescriptions  Medication Sig Dispense Refill  . CRESTOR 10 MG tablet Take 10 mg by mouth daily.       [name] albuterol (PROVENTIL HFA) 108 (90 BASE) MCG/ACT inhaler 1-2 PUFFS EVERY 4-6 HOURS AS NEEDED FOR SHORTNESS OF BREATH  1 Inhaler   0  . famotidine (PEPCID) 20 MG tablet Take 1 tablet (20 mg total) by mouth 2 (two) times daily.  60 tablet  11  . warfarin (COUMADIN) 10 MG tablet Take 1 tablet (10 mg total) by mouth as directed.  35 tablet  0    Allergies: Allergies not on file   Social History:  Social History Main Topics  . Smoking status: Current Everyday Smoker -- 1.0 packs/day for 30 years  . Smokeless tobacco: None  . Alcohol Use: 7.2 oz/week    12 Cans of beer per week  . Drug Use: Yes    Special: Marijuana    ROS:  Please see the history of present illness.   Respiratory ROS: positive for - cough, wheezing and sputum production most mornings with cough.  Gastrointestinal ROS: positive for - heartburn; negative for - blood in stools, diarrhea, hematemesis or melena  All other systems reviewed and negative.   Vital Signs: BP 102/78  Pulse 84  Ht 6\' 1"  (1.854 m)  Wt 199 lb 12.8 oz (90.629 kg)  BMI 26.36 kg/m2  PHYSICAL EXAM: Well nourished, well developed, in no acute distress HEENT: normal Neck: no JVD Cardiac:  normal S1, S2; RRR; no murmur Lungs:  Decreased breath sounds bilaterally, no wheezing,  rhonchi or rales Abd: soft, nontender, no hepatomegaly Ext: no edema Skin: warm and dry Neuro:  CNs 2-12 intact, no focal abnormalities noted Psych: normal affect  EKG:   NSR, HR 84, normal axis, NSSTTW changes, no change from prior  ASSESSMENT AND PLAN:

## 2011-07-06 NOTE — Patient Instructions (Signed)
Your physician recommends that you schedule a follow-up appointment in: 4 WEEKS WITH EITHER DR. Excell Seltzer OR SCOTT WEAVER, PA-C SAME DAY DR. Excell Seltzer IS IN THE OFFICE.  Your physician has recommended you make the following change in your medication: STOP TAKING PRAVASTATIN; AND STAY ON THE CRESTOR; START PEPCID 20 MG 1 TABLET TWICE DAILY; START PROVENTIL HFA 1-2 PUFFS EVERY 4-6 HOURS AS NEEDED FOR SHORTNESS OF BREATH  Your physician has recommended that you have a pulmonary function test WITH [facility], DX 786.05 SOB, THIS IS TO BE DONE @ [facility]. Pulmonary Function Tests are a group of tests that measure how well air moves in and out of your lungs.  Your physician recommends that you return for lab work in: 6 WEEKS FOR FASTING LIVER/LIPID PANEL 272.4 HYPERLIPIDEMIA  Your physician has requested that you have an echocardiogram DX 786.05 SOB. Echocardiography is a painless test that uses sound waves to create images of your heart. It provides your doctor with information about the size and shape of your heart and how well your heart's chambers and valves are working. This procedure takes approximately one hour. There are no restrictions for this procedure.

## 2011-07-07 ENCOUNTER — Encounter: Payer: Self-pay | Admitting: Physician Assistant

## 2011-07-07 LAB — PULMONARY FUNCTION TEST

## 2011-07-12 ENCOUNTER — Ambulatory Visit (HOSPITAL_COMMUNITY)
Admission: RE | Admit: 2011-07-12 | Discharge: 2011-07-12 | Disposition: A | Discharge status: Home / Self Care | Payer: Managed Care, Other (non HMO) | Source: Ambulatory Visit | Attending: Physician Assistant | Admitting: Physician Assistant

## 2011-07-12 DIAGNOSIS — R0602 Shortness of breath: Secondary | ICD-10-CM

## 2011-07-12 LAB — PULMONARY FUNCTION TEST

## 2011-07-14 ENCOUNTER — Ambulatory Visit (HOSPITAL_COMMUNITY): Payer: Managed Care, Other (non HMO) | Attending: Internal Medicine | Admitting: Radiology

## 2011-07-14 DIAGNOSIS — I059 Rheumatic mitral valve disease, unspecified: Secondary | ICD-10-CM | POA: Insufficient documentation

## 2011-07-14 DIAGNOSIS — R079 Chest pain, unspecified: Secondary | ICD-10-CM

## 2011-07-14 DIAGNOSIS — R0602 Shortness of breath: Secondary | ICD-10-CM | POA: Insufficient documentation

## 2011-07-14 DIAGNOSIS — R072 Precordial pain: Secondary | ICD-10-CM | POA: Insufficient documentation

## 2011-07-19 ENCOUNTER — Encounter: Payer: Managed Care, Other (non HMO) | Admitting: *Deleted

## 2011-08-03 ENCOUNTER — Encounter: Payer: Self-pay | Admitting: Cardiovascular Disease

## 2011-08-03 ENCOUNTER — Ambulatory Visit (INDEPENDENT_AMBULATORY_CARE_PROVIDER_SITE_OTHER): Payer: Managed Care, Other (non HMO) | Admitting: Cardiovascular Disease

## 2011-08-03 DIAGNOSIS — I70219 Atherosclerosis of native arteries of extremities with intermittent claudication, unspecified extremity: Secondary | ICD-10-CM

## 2011-08-03 DIAGNOSIS — R079 Chest pain, unspecified: Secondary | ICD-10-CM

## 2011-08-03 DIAGNOSIS — I739 Peripheral vascular disease, unspecified: Secondary | ICD-10-CM

## 2011-08-03 MED ORDER — ROSUVASTATIN CALCIUM 10 MG PO TABS: 10.0000 mg | ORAL_TABLET | Freq: Every day | ORAL | Status: DC

## 2011-08-03 MED ORDER — WARFARIN SODIUM 10 MG PO TABS: 10.0000 mg | ORAL_TABLET | ORAL | Status: DC

## 2011-08-03 NOTE — Progress Notes (Signed)
HPI:  This is a 50 year old gentleman presenting for followup evaluation.  He has lower extremity arterial occlusive disease maintained on chronic warfarin. The patient has required urgent embolectomy of his lower extremities. He also has had a renal infarction and he has a hypercoagulability disorder. He has done well on chronic warfarin. He has chronic intermittent chest pain and was seen by Tereso Newcomer November 28. He underwent cardiac catheterization in May 2012 and was found to have widely patent coronary arteries.  The patient is feeling better. His chest pain has resolved. He denies edema, orthopnea, or PND. His leg claudication symptoms are stable. He continues to smoke cigarettes and drink alcohol heavily.  Outpatient Encounter Prescriptions as of 08/03/2011  Medication Sig Dispense Refill  . rosuvastatin (CRESTOR) 10 MG tablet Take 1 tablet (10 mg total) by mouth daily.  30 tablet  11  . warfarin (COUMADIN) 10 MG tablet Take 1 tablet (10 mg total) by mouth as directed.  35 tablet  2  . DISCONTD: CRESTOR 10 MG tablet Take 10 mg by mouth daily.       [name] DISCONTD: warfarin (COUMADIN) 10 MG tablet Take 1 tablet (10 mg total) by mouth as directed.  35 tablet  0  . DISCONTD: albuterol (PROVENTIL HFA) 108 (90 BASE) MCG/ACT inhaler 1-2 PUFFS EVERY 4-6 HOURS AS NEEDED FOR SHORTNESS OF BREATH  1 Inhaler  0  . DISCONTD: famotidine (PEPCID) 20 MG tablet Take 1 tablet (20 mg total) by mouth 2 (two) times daily.  60 tablet  11    No Known Allergies  Past Medical History  Diagnosis Date  . PAD (peripheral artery disease)     a. left leg ischemia tx wtih embolectomy of left fem, pop, tib arteries with Dr. Edilia Bo - 08/2006;  b. known occlusion of right pop with collats - med Rx;  c.ABI's 8/09: R 1.0; L 0.91   . Renal infarct   . History of DVT of lower extremity   . Chest pain     LHC 5/12; minimal LAD irregs; no obs CAD; normal LVF    ROS: Negative except as per HPI  BP 148/86  Pulse 72  Ht  6\' 1"  (1.854 m)  Wt 91.989 kg (202 lb 12.8 oz)  BMI 26.76 kg/m2  PHYSICAL EXAM: Pt is alert and oriented, NAD HEENT: normal Neck: JVP - normal, carotids 2+= without bruits Lungs: CTA bilaterally CV: RRR without murmur or gallop Abd: soft, NT, Positive BS, no hepatomegaly Ext: no C/C/E Skin: warm/dry no rash  2-D echo: Study Conclusions  Left ventricle: The cavity size was normal. Wall thickness was normal. Systolic function was normal. The estimated ejection fraction was in the range of 55% to 60%. Features are consistent with a pseudonormal left ventricular filling pattern, with concomitant abnormal relaxation and increased filling pressure (grade 2 diastolic dysfunction).  ASSESSMENT AND PLAN:

## 2011-08-03 NOTE — Patient Instructions (Signed)
Your physician wants you to follow-up in: 1 YEAR.  You will receive a reminder letter in the mail two months in advance. If you don't receive a letter, please call our office to schedule the follow-up appointment.  Your physician recommends that you continue on your current medications as directed. Please refer to the Current Medication list given to you today.  

## 2011-08-03 NOTE — Assessment & Plan Note (Signed)
The patient was reassured. His chest pain has improved and he underwent recent cardiac catheterization with no obstructive CAD.

## 2011-08-03 NOTE — Assessment & Plan Note (Signed)
He needs to continue on chronic Coumadin.  We discussed the importance of tobacco cessation as it relates to his PAD and thrombotic events. He understands.

## 2011-08-03 NOTE — Progress Notes (Signed)
Patient ID: [name], male   DOB: [birth year], 50 y.o.   MRN: [id number] The patient had recent PFTs done. These were reviewed and they are normal.

## 2011-08-14 ENCOUNTER — Emergency Department (INDEPENDENT_AMBULATORY_CARE_PROVIDER_SITE_OTHER)
Admission: EM | Admit: 2011-08-14 | Discharge: 2011-08-14 | Disposition: A | Discharge status: Home / Self Care | Payer: Managed Care, Other (non HMO) | Source: Home / Self Care | Attending: Emergency Medicine | Admitting: Emergency Medicine

## 2011-08-14 ENCOUNTER — Encounter (HOSPITAL_COMMUNITY): Payer: Self-pay | Admitting: Cardiology

## 2011-08-14 DIAGNOSIS — H60399 Other infective otitis externa, unspecified ear: Secondary | ICD-10-CM

## 2011-08-14 DIAGNOSIS — H6000 Abscess of external ear, unspecified ear: Secondary | ICD-10-CM

## 2011-08-14 MED ORDER — SULFAMETHOXAZOLE-TRIMETHOPRIM 800-160 MG PO TABS: 1.0000 | ORAL_TABLET | Freq: Two times a day (BID) | ORAL | Status: AC

## 2011-08-14 MED ORDER — HYDROCODONE-ACETAMINOPHEN 5-325 MG PO TABS: 2.0000 | ORAL_TABLET | ORAL | Status: AC | PRN

## 2011-08-14 MED ORDER — MUPIROCIN CALCIUM 2 % EX CREA: TOPICAL_CREAM | Freq: Three times a day (TID) | CUTANEOUS | Status: AC

## 2011-08-14 NOTE — ED Notes (Signed)
Bacitracin and gauze dressing applied to left ear lobe. No drainage at the time dressing placed.

## 2011-08-14 NOTE — ED Provider Notes (Signed)
History     CSN: [id number]  Arrival date & time 08/14/11  4098   First MD Initiated Contact with Patient 08/14/11 0930      Chief Complaint  Patient presents with  . Recurrent Skin Infections    (Consider location/radiation/quality/duration/timing/severity/associated sxs/prior treatment) HPI Comments: Pt with painful erythematous mass of gradually increasing size in left earlobe. No N/V, fevers, drainage. Had a friend "poke" a hole in his posterior ear last night with some drainage but still with pain, swelling, redness. Took a friends' vicodin last night with relief. H/o of the same 2x- once required I&D. Second time pt drained it himself. This ear used to have a piercing, has not worn earring or re-pierced ear in "years."    The history is provided by the patient.    Past Medical History  Diagnosis Date  . PAD (peripheral artery disease)     a. left leg ischemia tx wtih embolectomy of left fem, pop, tib arteries with Dr. Edilia Bo - 08/2006;  b. known occlusion of right pop with collats - med Rx;  c.ABI's 8/09: R 1.0; L 0.91   . Renal infarct   . History of DVT of lower extremity   . Chest pain     LHC 5/12; minimal LAD irregs; no obs CAD; normal LVF    Past Surgical History  Procedure Date  . Cholecystectomy   . Left leg blood clot removal 2009 2009    History reviewed. No pertinent family history.  History  Substance Use Topics  . Smoking status: Current Everyday Smoker -- 1.0 packs/day for 30 years  . Smokeless tobacco: Not on file  . Alcohol Use: 15.0 oz/week    25 Cans of beer per week      Review of Systems  Constitutional: Negative for fever.  HENT: Positive for ear pain and ear discharge. Negative for hearing loss.   Gastrointestinal: Negative for nausea and vomiting.  Skin: Positive for wound.  Neurological: Negative for headaches.    Allergies  Review of patient's allergies indicates no known allergies.  Home Medications   Current Outpatient Rx    Name Route Sig Dispense Refill  . PRAVASTATIN SODIUM 40 MG PO TABS Oral Take 40 mg by mouth daily.      . WARFARIN SODIUM 10 MG PO TABS Oral Take 1 tablet (10 mg total) by mouth as directed. 35 tablet 2  . HYDROCODONE-ACETAMINOPHEN 5-325 MG PO TABS Oral Take 2 tablets by mouth every 4 (four) hours as needed for pain. 20 tablet 0  . MUPIROCIN CALCIUM 2 % EX CREA Topical Apply topically 3 (three) times daily. 15 g 0  . ROSUVASTATIN CALCIUM 10 MG PO TABS Oral Take 1 tablet (10 mg total) by mouth daily. 30 tablet 11  . SULFAMETHOXAZOLE-TRIMETHOPRIM 800-160 MG PO TABS Oral Take 1 tablet by mouth 2 (two) times daily. X 10 days 20 tablet 0    BP 114/73  Pulse 87  Temp(Src) 98.7 F (37.1 C) (Oral)  Resp 18  Wt 202 lb (91.627 kg)  SpO2 100%  Physical Exam  Nursing note and vitals reviewed. Constitutional: He is oriented to person, place, and time. He appears well-developed and well-nourished.  HENT:  Head: Normocephalic and atraumatic.  Left Ear: There is drainage, swelling and tenderness. No decreased hearing is noted.  Ears:       Erythema, tenderness, swelling left earlobe. healed piercing. 2 small holes posteriorally with expressable purulent and bloody material. Small cystic mass at second piercing. Healed I&D  scar. No auricular LN.   Eyes: Conjunctivae and EOM are normal. Pupils are equal, round, and reactive to light.  Neck: Normal range of motion.  Cardiovascular: Regular rhythm.   Pulmonary/Chest: Effort normal. No respiratory distress.  Abdominal: He exhibits no distension.  Musculoskeletal: Normal range of motion.  Neurological: He is alert and oriented to person, place, and time.  Skin: Skin is warm and dry.  Psychiatric: He has a normal mood and affect. His behavior is normal.    ED Course  Procedures (including critical care time)  Labs Reviewed - No data to display No results found.   1. Abscess of earlobe       MDM  Most likely infected sebaceous cyst. Was  already spontaneously draining. Expressed mod amt of purulent d/c and sebaceous material from 2 holes already present. Still with some surrounding cellulitis. Will start on abx, bactroban, warm compresses.   Luiz Blare, MD 08/14/11 1101

## 2011-08-14 NOTE — ED Notes (Signed)
Pt reports "boil" to left ear. Left ear lobe is noted to be swollen and red. Pt denies fever. Pt reports loose stool for the past couple days. Tolerating PO intake. Pt states he has not worn an earring in that ear for 20 years.

## 2011-08-17 ENCOUNTER — Other Ambulatory Visit (INDEPENDENT_AMBULATORY_CARE_PROVIDER_SITE_OTHER): Payer: Managed Care, Other (non HMO) | Admitting: *Deleted

## 2011-08-17 DIAGNOSIS — E785 Hyperlipidemia, unspecified: Secondary | ICD-10-CM

## 2011-08-17 LAB — LIPID PANEL
Cholesterol: 160 mg/dL (ref 0–200)
Triglycerides: 82 mg/dL (ref 0.0–149.0)

## 2011-08-17 LAB — HEPATIC FUNCTION PANEL
ALT: 19 U/L (ref 0–53)
AST: 22 U/L (ref 0–37)
Albumin: 3.9 g/dL (ref 3.5–5.2)

## 2011-12-08 ENCOUNTER — Telehealth: Payer: Self-pay | Admitting: *Deleted

## 2011-12-08 NOTE — Telephone Encounter (Signed)
Called pt and left message to call [facility] to set up appt to be seen in [facility] to have INR checked as he has not been seen since December 2012 and have request from Pharmacy for Coumadin refill and informed could not refill coumadin until seen

## 2011-12-21 ENCOUNTER — Ambulatory Visit (INDEPENDENT_AMBULATORY_CARE_PROVIDER_SITE_OTHER): Payer: Managed Care, Other (non HMO) | Admitting: *Deleted

## 2011-12-21 DIAGNOSIS — R079 Chest pain, unspecified: Secondary | ICD-10-CM

## 2011-12-21 DIAGNOSIS — Z7901 Long term (current) use of anticoagulants: Secondary | ICD-10-CM

## 2011-12-21 DIAGNOSIS — I70219 Atherosclerosis of native arteries of extremities with intermittent claudication, unspecified extremity: Secondary | ICD-10-CM

## 2011-12-21 DIAGNOSIS — I82409 Acute embolism and thrombosis of unspecified deep veins of unspecified lower extremity: Secondary | ICD-10-CM

## 2011-12-21 MED ORDER — WARFARIN SODIUM 10 MG PO TABS: 10.0000 mg | ORAL_TABLET | ORAL | Status: DC

## 2012-02-15 ENCOUNTER — Encounter: Payer: Self-pay | Admitting: Internal Medicine

## 2012-02-15 ENCOUNTER — Ambulatory Visit (INDEPENDENT_AMBULATORY_CARE_PROVIDER_SITE_OTHER): Payer: Managed Care, Other (non HMO) | Admitting: Internal Medicine

## 2012-02-15 ENCOUNTER — Ambulatory Visit (INDEPENDENT_AMBULATORY_CARE_PROVIDER_SITE_OTHER)
Admission: RE | Admit: 2012-02-15 | Discharge: 2012-02-15 | Disposition: A | Discharge status: Home / Self Care | Payer: Managed Care, Other (non HMO) | Source: Ambulatory Visit | Attending: Internal Medicine | Admitting: Internal Medicine

## 2012-02-15 VITALS — BP 130/84 | HR 87 | Temp 98.2°F | Wt 188.0 lb

## 2012-02-15 DIAGNOSIS — R1110 Vomiting, unspecified: Secondary | ICD-10-CM

## 2012-02-15 DIAGNOSIS — R634 Abnormal weight loss: Secondary | ICD-10-CM

## 2012-02-15 DIAGNOSIS — E785 Hyperlipidemia, unspecified: Secondary | ICD-10-CM

## 2012-02-15 DIAGNOSIS — R109 Unspecified abdominal pain: Secondary | ICD-10-CM

## 2012-02-15 LAB — COMPREHENSIVE METABOLIC PANEL
ALT: 21 U/L (ref 0–53)
AST: 26 U/L (ref 0–37)
Chloride: 103 mEq/L (ref 96–112)
Creatinine, Ser: 1 mg/dL (ref 0.4–1.5)
Sodium: 138 mEq/L (ref 135–145)
Total Bilirubin: 0.6 mg/dL (ref 0.3–1.2)

## 2012-02-15 LAB — POCT URINALYSIS DIPSTICK
Ketones, UA: NEGATIVE
Protein, UA: NEGATIVE
Spec Grav, UA: 1.01
pH, UA: 8

## 2012-02-15 LAB — CBC WITH DIFFERENTIAL/PLATELET
Basophils Absolute: 0 10*3/uL (ref 0.0–0.1)
Eosinophils Absolute: 0.1 10*3/uL (ref 0.0–0.7)
HCT: 45.5 % (ref 39.0–52.0)
Hemoglobin: 15.4 g/dL (ref 13.0–17.0)
Lymphs Abs: 1.3 10*3/uL (ref 0.7–4.0)
MCHC: 33.8 g/dL (ref 30.0–36.0)
Monocytes Absolute: 0.5 10*3/uL (ref 0.1–1.0)
Neutro Abs: 3.6 10*3/uL (ref 1.4–7.7)
RDW: 15 % — ABNORMAL HIGH (ref 11.5–14.6)

## 2012-02-15 MED ORDER — OMEPRAZOLE 40 MG PO CPDR: 40.0000 mg | DELAYED_RELEASE_CAPSULE | Freq: Every day | ORAL | Status: DC

## 2012-02-15 NOTE — Patient Instructions (Addendum)
Please get your x-ray at the other [place]  office located at: [address] [place], across from [facility].  Please go to the basement, this is a walk-in facility, they are open from 8:30 to 5:30 PM. Phone number [phone]. ----- Call the Coumadin clinic and ask to be seen this week Start omeprazole Call if they stomach pain gets worse or no better in few days. Routine  visit in 2 months

## 2012-02-15 NOTE — Assessment & Plan Note (Signed)
Self DC cholesterol medication due to her hives

## 2012-02-15 NOTE — Progress Notes (Signed)
Subjective:    Patient ID: [name], male    DOB: [birth year], 51 y.o.   MRN: [id number]  HPI Last visit 02-2009, seen urgently today with the following symptoms: -- pain described as cramps, "knotting" at all quadrants of the abdomen and some discomfort at the back as well. Symptoms do not  change with food, they were worse yesterday than today. The only thing he has taken is some Mylanta. --Also has lost 7 pounds, patient unsure of the time frame but his diet has been different since he separated from his wife few months ago. --Also complains of regurgitation, for the last few months every morning he regurgitates undigested food from   the previous night. See review of systems.  Past Medical History: PAD History of critical left leg ischemia 2008      a. s/p embolectomy of the Left Femoral, Popliteal, and Tibial Arteries Edilia Bo - 08/2006)      b. known occlusion of R Popliteal with collaterals - not intervened upon      c. ABI's 03/2008: r: 1.0; L 0.91 H/O right renal infarct H/O DVT On chronic Coumadin CHEST PAIN---. --12-2002 negative stress test ---10/05/06 - [place]. Myoview.  EF 60%, no ischemia. -- catheterization 5/12; minimal LAD irregs; no obs CAD; normal LVF  Poly substance abuse--alcohol, crack cocaine 9/07 NCS R CTS L MILD ULNAR NEUROPATHY ??C2 RADICULOPATHY Colonoscopy 2007    Past Surgical History: Cholecystectomy See PMH  Social History ETOH-- daily beer ~ 10  Tobacco-- 1 ppd Drugs-- denies , clean since ~ 2009 Occupation-- active , works at [employer] by himself , separated from wife since ~ 12-2011  FH Pancreatic cancer-- B at age 89 Lung cancer-- uncle    Review of Systems Occasional nausea, no vomiting, chronically has loose stools but no blood in the stools. Denies odynophagia or dysphasia. Admits to occasional heartburn. No hematemesis. Denies postprandial abdominal pain. He continues to smoke and drink, see social history. No dysuria,  penile discharge but a month ago also a drop of blood in the urine (after intense sexual activity per patient) + cough mostly at night     Objective:   Physical Exam  General -- alert, well-developed, and well-nourished.   Neck -- no LADs, no supraclavicular mass . HEENT -- no pallor  or jaundice Lungs -- normal respiratory effort, no intercostal retractions, no accessory muscle use, and normal breath sounds.   Heart-- normal rate, regular rhythm, no murmur, and no gallop.   Abdomen--soft, non-tender, no distention, no masses, no HSM, no guarding, and no rigidity.   Extremities-- no pretibial edema bilaterally neurologic-- alert & oriented X3 and strength normal in all extremities. Psych-- Cognition and judgment appear intact. Alert and cooperative with normal attention span and concentration.  not anxious appearing and not depressed appearing.      Assessment & Plan:   Presents with the following problems: 1. One-day history of diffuse abdominal pain, exam benign. 2. Question of weight loss, 7 pounds. 3. Several months history of early morning regurgitation, PUD?  Pyloric stenosis? Others ? 4. Ongoing tobacco and alcohol abuse 5. Single episode of blood in the urine, urinalysis negative, currently asymptomatic. Plan: General labs to rule out anemia, pancreatitis, will also check liver enzymes GI referral for evaluation of regurgitation. CXR Most likely will also need a CT of the abdomen. Start omeprazole 40 mg. Message sent to the Coumadin clinic --.he is due for a check up and also because omeprazole may interact  with Coumadin

## 2012-02-16 ENCOUNTER — Encounter: Payer: Self-pay | Admitting: Gastroenterology

## 2012-02-16 ENCOUNTER — Encounter: Payer: Self-pay | Admitting: *Deleted

## 2012-02-16 ENCOUNTER — Ambulatory Visit (INDEPENDENT_AMBULATORY_CARE_PROVIDER_SITE_OTHER): Payer: Managed Care, Other (non HMO) | Admitting: *Deleted

## 2012-02-16 DIAGNOSIS — I82409 Acute embolism and thrombosis of unspecified deep veins of unspecified lower extremity: Secondary | ICD-10-CM

## 2012-02-16 DIAGNOSIS — Z7901 Long term (current) use of anticoagulants: Secondary | ICD-10-CM

## 2012-02-16 LAB — POCT INR: INR: 4.9

## 2012-03-07 ENCOUNTER — Ambulatory Visit (INDEPENDENT_AMBULATORY_CARE_PROVIDER_SITE_OTHER): Payer: Managed Care, Other (non HMO) | Admitting: *Deleted

## 2012-03-07 DIAGNOSIS — Z7901 Long term (current) use of anticoagulants: Secondary | ICD-10-CM

## 2012-03-07 DIAGNOSIS — I70219 Atherosclerosis of native arteries of extremities with intermittent claudication, unspecified extremity: Secondary | ICD-10-CM

## 2012-03-07 DIAGNOSIS — I82409 Acute embolism and thrombosis of unspecified deep veins of unspecified lower extremity: Secondary | ICD-10-CM

## 2012-03-07 LAB — POCT INR: INR: 3

## 2012-03-07 MED ORDER — WARFARIN SODIUM 10 MG PO TABS: 10.0000 mg | ORAL_TABLET | ORAL | Status: DC

## 2012-03-14 ENCOUNTER — Emergency Department (INDEPENDENT_AMBULATORY_CARE_PROVIDER_SITE_OTHER)
Admission: EM | Admit: 2012-03-14 | Discharge: 2012-03-14 | Disposition: A | Discharge status: Home / Self Care | Payer: Managed Care, Other (non HMO) | Source: Home / Self Care | Attending: Emergency Medicine | Admitting: Emergency Medicine

## 2012-03-14 ENCOUNTER — Encounter (HOSPITAL_COMMUNITY): Payer: Self-pay

## 2012-03-14 DIAGNOSIS — M75101 Unspecified rotator cuff tear or rupture of right shoulder, not specified as traumatic: Secondary | ICD-10-CM

## 2012-03-14 DIAGNOSIS — M67919 Unspecified disorder of synovium and tendon, unspecified shoulder: Secondary | ICD-10-CM

## 2012-03-14 MED ORDER — TRAMADOL HCL 50 MG PO TABS: 50.0000 mg | ORAL_TABLET | Freq: Four times a day (QID) | ORAL | Status: AC | PRN

## 2012-03-14 NOTE — ED Notes (Signed)
C/o rt shoulder pain for 2 weeks.  States he was doing push-ups and heard a pop and felt pain in rt shoulder.  States he did the same thing to this shoulder approx 1 year ago and it took 7 months to get back to normal.  States the pain kept him awake last night.

## 2012-03-14 NOTE — ED Provider Notes (Signed)
History     CSN: [id number]  Arrival date & time 03/14/12  1117   First MD Initiated Contact with Patient 03/14/12 1128      Chief Complaint  Patient presents with  . Shoulder Pain    (Consider location/radiation/quality/duration/timing/severity/associated sxs/prior treatment) HPI Comments: Patient presents urgent care this morning complaining of right shoulder pain for the last 2 weeks. In the last couple days he's been performing and 1 some pushups and he heard a pop and since then been feeling more right shoulder pain. (Patient points to anterior aspect of right shoulder). He describes about a year ago he had a similar incident in which he was doing pushups I took about 7 months for him to heal. He denies having received any localized injections of having had any orthopedic followup or evaluation in the past. Patient denies any numbness, tingling sensations or weakness of his right upper arm.  Patient is a 51 y.o. male presenting with shoulder pain. The history is provided by the patient.  Shoulder Pain This is a new problem. The current episode started more than 1 week ago. The problem occurs constantly. The problem has not changed since onset.Pertinent negatives include no chest pain, no abdominal pain and no shortness of breath. Exacerbated by: Movements especially raising his arm up in front of him. The symptoms are relieved by rest. He has tried nothing for the symptoms. The treatment provided no relief.    Past Medical History  Diagnosis Date  . PAD (peripheral artery disease)     a. left leg ischemia tx wtih embolectomy of left fem, pop, tib arteries with Dr. Edilia Bo - 08/2006;  b. known occlusion of right pop with collats - med Rx;  c.ABI's 8/09: R 1.0; L 0.91   . Renal infarct   . History of DVT of lower extremity   . Chest pain     LHC 5/12; minimal LAD irregs; no obs CAD; normal LVF  . Hypertension   . Internal hemorrhoids     Past Surgical History  Procedure Date  .  Cholecystectomy   . Left leg blood clot removal 2009 2009  . Tonsillectomy   . Wrist surgery     No family history on file.  History  Substance Use Topics  . Smoking status: Current Everyday Smoker -- 1.0 packs/day for 30 years  . Smokeless tobacco: Not on file  . Alcohol Use: 15.0 oz/week    25 Cans of beer per week      Review of Systems  Constitutional: Negative for fever, activity change, appetite change and fatigue.  Respiratory: Negative for shortness of breath.   Cardiovascular: Negative for chest pain.  Gastrointestinal: Negative for abdominal pain.  Skin: Negative for color change, pallor, rash and wound.  Neurological: Negative for weakness and numbness.    Allergies  Review of patient's allergies indicates no known allergies.  Home Medications   Current Outpatient Rx  Name Route Sig Dispense Refill  . WARFARIN SODIUM 10 MG PO TABS Oral Take 1 tablet (10 mg total) by mouth as directed. 35 tablet 0  . OMEPRAZOLE 40 MG PO CPDR Oral Take 1 capsule (40 mg total) by mouth daily. 30 capsule 1  . TRAMADOL HCL 50 MG PO TABS Oral Take 1 tablet (50 mg total) by mouth every 6 (six) hours as needed for pain. 15 tablet 0    BP 140/98  Pulse 80  Temp 98.3 F (36.8 C) (Oral)  Resp 16  SpO2 100%  Physical  Exam  Nursing note and vitals reviewed. Constitutional: He appears well-developed. No distress.  Musculoskeletal: He exhibits tenderness. He exhibits no edema.       Arms: Neurological: He is alert. No cranial nerve deficit. He exhibits normal muscle tone. Coordination normal.  Skin: No erythema. No pallor.    ED Course  Procedures (including critical care time)  Labs Reviewed - No data to display No results found.   1. Rotator cuff syndrome of right shoulder       MDM  Recurrent right shoulder pain new exacerbation the last 2 weeks. Patient does not have signs during exam of a complete rotator cuff tear. I have advised him that based on the chronicity  and recurrence he of this problem that he should consult with an orthopedic Dr. He has ever had an MRI or CT scan of his shoulder. Have prescribe him tramadol and told to take Tylenol as well. Patient was not prescribed because to work and stated NSAID, and was advised not to take NSAIDs as he is on chronic anticoagulation. Patient understands to followup with primary care Dr. or the orthopedic Dr. this shoulder discomfort and pain persists.        Jimmie Molly, MD 03/14/12 1525

## 2012-03-15 ENCOUNTER — Ambulatory Visit: Payer: Managed Care, Other (non HMO) | Admitting: Gastroenterology

## 2012-03-17 ENCOUNTER — Telehealth (HOSPITAL_COMMUNITY): Payer: Self-pay | Admitting: Family Medicine

## 2012-03-17 NOTE — ED Notes (Signed)
Chart reviewed by Dr Tressia Danas and she granted patient a work note extension.  Patient given note to return on 03/19/12.  Patient informed he will need to obtain any additional extensions from orthopaedic office.  Patient states [facility]

## 2012-04-17 ENCOUNTER — Ambulatory Visit: Payer: Managed Care, Other (non HMO) | Admitting: Gastroenterology

## 2012-04-18 ENCOUNTER — Encounter: Payer: Self-pay | Admitting: Gastroenterology

## 2012-04-23 ENCOUNTER — Ambulatory Visit (INDEPENDENT_AMBULATORY_CARE_PROVIDER_SITE_OTHER): Payer: Managed Care, Other (non HMO) | Admitting: *Deleted

## 2012-04-23 DIAGNOSIS — I70219 Atherosclerosis of native arteries of extremities with intermittent claudication, unspecified extremity: Secondary | ICD-10-CM

## 2012-04-23 DIAGNOSIS — I82409 Acute embolism and thrombosis of unspecified deep veins of unspecified lower extremity: Secondary | ICD-10-CM

## 2012-04-23 DIAGNOSIS — Z7901 Long term (current) use of anticoagulants: Secondary | ICD-10-CM

## 2012-04-23 MED ORDER — WARFARIN SODIUM 10 MG PO TABS: ORAL_TABLET | ORAL | Status: DC

## 2012-05-07 ENCOUNTER — Ambulatory Visit (INDEPENDENT_AMBULATORY_CARE_PROVIDER_SITE_OTHER): Payer: Managed Care, Other (non HMO) | Admitting: *Deleted

## 2012-05-07 DIAGNOSIS — Z7901 Long term (current) use of anticoagulants: Secondary | ICD-10-CM

## 2012-05-07 DIAGNOSIS — I82409 Acute embolism and thrombosis of unspecified deep veins of unspecified lower extremity: Secondary | ICD-10-CM

## 2012-05-15 ENCOUNTER — Ambulatory Visit: Payer: Managed Care, Other (non HMO) | Admitting: Gastroenterology

## 2012-05-18 ENCOUNTER — Telehealth: Payer: Self-pay | Admitting: Gastroenterology

## 2012-05-18 NOTE — Telephone Encounter (Signed)
Message copied by Arna Snipe on Fri May 18, 2012  3:08 PM ------      Message from: Jessee Avers      Created: Thu May 17, 2012  9:04 AM       Bill patient per Dr. Russella Dar

## 2012-05-29 ENCOUNTER — Ambulatory Visit (INDEPENDENT_AMBULATORY_CARE_PROVIDER_SITE_OTHER): Payer: Managed Care, Other (non HMO) | Admitting: *Deleted

## 2012-05-29 DIAGNOSIS — I82409 Acute embolism and thrombosis of unspecified deep veins of unspecified lower extremity: Secondary | ICD-10-CM

## 2012-05-29 DIAGNOSIS — Z7901 Long term (current) use of anticoagulants: Secondary | ICD-10-CM

## 2012-06-05 ENCOUNTER — Other Ambulatory Visit: Payer: Self-pay | Admitting: Cardiovascular Disease

## 2012-06-06 ENCOUNTER — Telehealth: Payer: Self-pay | Admitting: Cardiovascular Disease

## 2012-06-06 NOTE — Telephone Encounter (Signed)
Telephoned pt and discussed his coumadin dosage.

## 2012-06-06 NOTE — Telephone Encounter (Signed)
New Problem:    Patient called in because he lost his coumadin dosing instructions and would like to go over them again with you.  Please call back.

## 2012-06-13 ENCOUNTER — Encounter: Payer: Self-pay | Admitting: Gastroenterology

## 2012-06-13 ENCOUNTER — Ambulatory Visit (INDEPENDENT_AMBULATORY_CARE_PROVIDER_SITE_OTHER): Payer: Managed Care, Other (non HMO) | Admitting: Gastroenterology

## 2012-06-13 VITALS — BP 118/82 | HR 86 | Ht 73.0 in | Wt 187.6 lb

## 2012-06-13 DIAGNOSIS — R112 Nausea with vomiting, unspecified: Secondary | ICD-10-CM

## 2012-06-13 DIAGNOSIS — K219 Gastro-esophageal reflux disease without esophagitis: Secondary | ICD-10-CM

## 2012-06-13 DIAGNOSIS — R634 Abnormal weight loss: Secondary | ICD-10-CM

## 2012-06-13 MED ORDER — OMEPRAZOLE 40 MG PO CPDR: 40.0000 mg | DELAYED_RELEASE_CAPSULE | Freq: Every day | ORAL | Status: DC

## 2012-06-13 NOTE — Patient Instructions (Addendum)
We have sent the following medications to your pharmacy for you to pick up at your convenience: omeprazole.  Patient advised to avoid spicy, acidic, citrus, chocolate, mints, fruit and fruit juices.  Limit the intake of caffeine, alcohol and Soda.  Don't exercise too soon after eating.  Don't lie down within 3-4 hours of eating.  Elevate the head of your bed.  You will be due for a recall colonoscopy in 12/2015. We will send you a reminder in the mail when it gets closer to that time.  Decrease alcohol consumption and discontinue tobacco products.

## 2012-06-13 NOTE — Progress Notes (Signed)
History of Present Illness: This is a 51 year old male with recurrent morning nausea vomiting and regurgitation associated with a mild weight loss. Previously underwent colonoscopy in 2007 showing only internal hemorrhoids and upper endoscopy in 2002 which was normal. Blood work in July 2013 was unremarkable. He does not take omeprazole he has at home. He does not note heartburn. He drinks 1 to 2 sixpacks every evening. Denies  abdominal pain, constipation, diarrhea, change in stool caliber, melena, hematochezia, dysphagia, chest pain.  Review of Systems: Pertinent positive and negative review of systems were noted in the above HPI section. All other review of systems were otherwise negative.  Current Medications, Allergies, Past Medical History, Past Surgical History, Family History and Social History were reviewed in [employer] record.  Physical Exam: General: Well developed , well nourished, no acute distress Head: Normocephalic and atraumatic Eyes:  sclerae anicteric, EOMI Ears: Normal auditory acuity Mouth: No deformity or lesions Neck: Supple, no masses or thyromegaly Lungs: Clear throughout to auscultation Heart: Regular rate and rhythm; no murmurs, rubs or bruits Abdomen: Soft, non tender and non distended. No masses, hepatosplenomegaly or hernias noted. Normal Bowel sounds Musculoskeletal: Symmetrical with no gross deformities  Skin: No lesions on visible extremities Pulses:  Normal pulses noted Extremities: No clubbing, cyanosis, edema or deformities noted Neurological: Alert oriented x 4, grossly nonfocal Cervical Nodes:  No significant cervical adenopathy Inguinal Nodes: No significant inguinal adenopathy Psychological:  Alert and cooperative. Normal mood and affect  Assessment and Recommendations:  1. Probable GERD with morning nausea and vomiting. He is advised to substantially curtailed his alcohol intake to no more than 2 or 3 alcoholic beverages per  day. He is advised to discontinue cigarette smoking. All standard antireflux measures supplied to him. Begin omeprazole 40 mg daily and return office visit in 4 weeks. If his symptoms are not adequately controlled will proceed with upper endoscopy.  2. Colorectal cancer screening, average risk. Screening colonoscopy due in 2017.

## 2012-07-13 ENCOUNTER — Ambulatory Visit (INDEPENDENT_AMBULATORY_CARE_PROVIDER_SITE_OTHER): Payer: Managed Care, Other (non HMO) | Admitting: *Deleted

## 2012-07-13 DIAGNOSIS — Z7901 Long term (current) use of anticoagulants: Secondary | ICD-10-CM

## 2012-07-13 DIAGNOSIS — I82409 Acute embolism and thrombosis of unspecified deep veins of unspecified lower extremity: Secondary | ICD-10-CM

## 2012-07-13 LAB — POCT INR: INR: 1.9

## 2012-07-26 ENCOUNTER — Ambulatory Visit (INDEPENDENT_AMBULATORY_CARE_PROVIDER_SITE_OTHER): Payer: Managed Care, Other (non HMO) | Admitting: Cardiovascular Disease

## 2012-07-26 ENCOUNTER — Encounter: Payer: Self-pay | Admitting: Cardiovascular Disease

## 2012-07-26 ENCOUNTER — Ambulatory Visit (INDEPENDENT_AMBULATORY_CARE_PROVIDER_SITE_OTHER): Payer: Managed Care, Other (non HMO) | Admitting: *Deleted

## 2012-07-26 VITALS — BP 110/80 | HR 80 | Ht 73.0 in | Wt 190.0 lb

## 2012-07-26 DIAGNOSIS — I82409 Acute embolism and thrombosis of unspecified deep veins of unspecified lower extremity: Secondary | ICD-10-CM

## 2012-07-26 DIAGNOSIS — I70219 Atherosclerosis of native arteries of extremities with intermittent claudication, unspecified extremity: Secondary | ICD-10-CM

## 2012-07-26 DIAGNOSIS — Z7901 Long term (current) use of anticoagulants: Secondary | ICD-10-CM

## 2012-07-26 LAB — POCT INR: INR: 1.5

## 2012-07-26 NOTE — Progress Notes (Signed)
   HPI:  51 year old gentleman presenting for followup evaluation. He has lower extremity arterial occlusive disease maintained on chronic warfarin. He required urgent embolectomy of his lower extremities in the past. He's also had a renal infarction and he has been diagnosed with a hypercoagulable state. He underwent cardiac catheterization in May 2012 and was found to have widely patent coronary arteries.  He's had no chest pain, dyspnea, or stroke/TIA symptoms. He reports stable lower leg discomfort with walking, but this is not limiting him. He has had no rest pain or ulceration. He continues to smoke and drink alcohol.  Outpatient Encounter Prescriptions as of 07/26/2012  Medication Sig Dispense Refill  . omeprazole (PRILOSEC) 40 MG capsule Take 1 capsule (40 mg total) by mouth daily.  30 capsule  11  . warfarin (COUMADIN) 10 MG tablet TAKE AS DIRECTED BY  COUMADIN  CLINIC  40 tablet  1    Allergies  Allergen Reactions  . Pravastatin     Itching   . Simvastatin     Itching     Past Medical History  Diagnosis Date  . PAD (peripheral artery disease)     a. left leg ischemia tx wtih embolectomy of left fem, pop, tib arteries with Dr. Edilia Bo - 08/2006;  b. known occlusion of right pop with collats - med Rx;  c.ABI's 8/09: R 1.0; L 0.91   . Renal infarct   . History of DVT of lower extremity   . Chest pain     LHC 5/12; minimal LAD irregs; no obs CAD; normal LVF  . Hypertension   . Internal hemorrhoids     ROS: Negative except as per HPI  BP 110/80  Pulse 80  Ht 6\' 1"  (1.854 m)  Wt 86.183 kg (190 lb)  BMI 25.07 kg/m2  SpO2 97%  PHYSICAL EXAM: Pt is alert and oriented, NAD HEENT: normal Neck: JVP - normal, carotids 2+= without bruits Lungs: CTA bilaterally CV: RRR without murmur or gallop Abd: soft, NT, Positive BS, no hepatomegaly Ext: no C/C/E, distal pulses diminished Skin: warm/dry no rash  EKG:  Sinus rhythm 80 beats per minute, minimal voltage criteria for  LVH.  ASSESSMENT AND PLAN: 1. Lower extremity peripheral arterial disease. Stable symptoms of mild intermittent claudication. Continue anticoagulation with warfarin. The patient has a history of hypercoagulability. I counseled him regarding tobacco cessation but I don't think he will quit. Followup in 1 year.

## 2012-07-26 NOTE — Patient Instructions (Addendum)
Your physician wants you to follow-up in: 1 YEAR with Dr Cooper.  You will receive a reminder letter in the mail two months in advance. If you don't receive a letter, please call our office to schedule the follow-up appointment.  Your physician recommends that you continue on your current medications as directed. Please refer to the Current Medication list given to you today.  

## 2012-09-14 ENCOUNTER — Other Ambulatory Visit: Payer: Self-pay | Admitting: Cardiovascular Disease

## 2012-09-14 NOTE — Telephone Encounter (Signed)
Pt calling to say he is out of warfarin and needs refill asap

## 2012-09-17 ENCOUNTER — Ambulatory Visit (INDEPENDENT_AMBULATORY_CARE_PROVIDER_SITE_OTHER): Payer: Managed Care, Other (non HMO) | Admitting: *Deleted

## 2012-09-17 DIAGNOSIS — Z7901 Long term (current) use of anticoagulants: Secondary | ICD-10-CM

## 2012-09-17 DIAGNOSIS — I82409 Acute embolism and thrombosis of unspecified deep veins of unspecified lower extremity: Secondary | ICD-10-CM

## 2012-09-17 LAB — POCT INR: INR: 2

## 2012-09-17 MED ORDER — WARFARIN SODIUM 10 MG PO TABS: 10.0000 mg | ORAL_TABLET | ORAL | Status: DC

## 2012-10-26 ENCOUNTER — Ambulatory Visit: Payer: Managed Care, Other (non HMO)

## 2012-10-26 ENCOUNTER — Ambulatory Visit (INDEPENDENT_AMBULATORY_CARE_PROVIDER_SITE_OTHER): Payer: Managed Care, Other (non HMO) | Admitting: *Deleted

## 2012-10-26 DIAGNOSIS — I82409 Acute embolism and thrombosis of unspecified deep veins of unspecified lower extremity: Secondary | ICD-10-CM

## 2012-10-26 DIAGNOSIS — Z7901 Long term (current) use of anticoagulants: Secondary | ICD-10-CM

## 2012-10-26 MED ORDER — WARFARIN SODIUM 10 MG PO TABS: 10.0000 mg | ORAL_TABLET | ORAL | Status: DC

## 2012-11-23 ENCOUNTER — Ambulatory Visit (INDEPENDENT_AMBULATORY_CARE_PROVIDER_SITE_OTHER): Payer: Managed Care, Other (non HMO) | Admitting: Internal Medicine

## 2012-11-23 ENCOUNTER — Ambulatory Visit (INDEPENDENT_AMBULATORY_CARE_PROVIDER_SITE_OTHER): Payer: Managed Care, Other (non HMO) | Admitting: *Deleted

## 2012-11-23 ENCOUNTER — Encounter (INDEPENDENT_AMBULATORY_CARE_PROVIDER_SITE_OTHER): Payer: Managed Care, Other (non HMO)

## 2012-11-23 ENCOUNTER — Encounter: Payer: Self-pay | Admitting: Internal Medicine

## 2012-11-23 VITALS — BP 142/96 | HR 75 | Ht 73.0 in | Wt 192.0 lb

## 2012-11-23 DIAGNOSIS — I70219 Atherosclerosis of native arteries of extremities with intermittent claudication, unspecified extremity: Secondary | ICD-10-CM

## 2012-11-23 DIAGNOSIS — I739 Peripheral vascular disease, unspecified: Secondary | ICD-10-CM

## 2012-11-23 DIAGNOSIS — I82409 Acute embolism and thrombosis of unspecified deep veins of unspecified lower extremity: Secondary | ICD-10-CM

## 2012-11-23 DIAGNOSIS — M79609 Pain in unspecified limb: Secondary | ICD-10-CM

## 2012-11-23 DIAGNOSIS — Z7901 Long term (current) use of anticoagulants: Secondary | ICD-10-CM

## 2012-11-23 DIAGNOSIS — R079 Chest pain, unspecified: Secondary | ICD-10-CM

## 2012-11-23 MED ORDER — ACETAMINOPHEN-CODEINE #3 300-30 MG PO TABS: 1.0000 | ORAL_TABLET | ORAL | Status: DC | PRN

## 2012-11-23 NOTE — Patient Instructions (Addendum)
Your physician recommends that you schedule a follow-up appointment in: 2 weeks with Dr Excell Seltzer

## 2012-11-23 NOTE — Assessment & Plan Note (Signed)
The patient comes in with complaints of discomfort in his left leg. There is slight difference in size left versus right. Pulse feels okay. Given his history of arterial and venous vascular disease we undertook assessment of low. Arterial and venous circulations were normal. It is given Tylenol No. 3 for pain.

## 2012-11-23 NOTE — Progress Notes (Signed)
Patient Care Team: Wanda Plump, MD as PCP - General   HPI  [name] is a 52 y.o. male Is seen as an add-on today because of pain in his leg.  He has a history of lower extremity arterial occlusive disease treated with chronic warfarin. He has required urgent embolectomy in the past. He has a history of renal infarction and has a hypercoagulable state.  Catheterization 2012 demonstrated widely patent arteries.  He comes in today with 2 complaints. The first is chest discomfort. It is not occurring with walking. It occurs with lifting and returning his chest. Typically was also about 5 minutes.  The other complaint relates to his left leg for which she is a prior embolectomy. There has been some increased swelling, some pain into his calf and down to his he, fatigue in the thigh with walking it is relieved by rest.  Past Medical History  Diagnosis Date  . PAD (peripheral artery disease)     a. left leg ischemia tx wtih embolectomy of left fem, pop, tib arteries with Dr. Edilia Bo - 08/2006;  b. known occlusion of right pop with collats - med Rx;  c.ABI's 8/09: R 1.0; L 0.91   . Renal infarct   . History of DVT of lower extremity   . Chest pain     LHC 5/12; minimal LAD irregs; no obs CAD; normal LVF  . Hypertension   . Internal hemorrhoids     Past Surgical History  Procedure Laterality Date  . Cholecystectomy    . Left leg blood clot removal 2009  2009  . Tonsillectomy    . Wrist surgery      Current Outpatient Prescriptions  Medication Sig Dispense Refill  . omeprazole (PRILOSEC) 40 MG capsule Take 1 capsule (40 mg total) by mouth daily.  30 capsule  11  . warfarin (COUMADIN) 10 MG tablet Take 1 tablet (10 mg total) by mouth as directed.  30 tablet  3   No current facility-administered medications for this visit.    Allergies  Allergen Reactions  . Pravastatin     Itching   . Simvastatin     Itching     Review of Systems negative except from HPI and  PMH  Physical Exam BP 142/96  Pulse 75  Ht 6\' 1"  (1.854 m)  Wt 192 lb (87.091 kg)  BMI 25.34 kg/m2 Well developed and well nourished in no acute distress HENT normal E scleral and icterus clear Neck Supple JVP flat; carotids brisk and full Clear to ausculation  Regular rate and rhythm, no murmurs gallops or rub Soft with active bowel sounds No clubbing cyanosis none Edema Alert and oriented, grossly normal motor and sensory function Skin Warm and Dry  INR 1.6  ECG demonstrates sinus rhythm at 71 with intervals 18/08/38  Assessment and  Plan

## 2012-11-23 NOTE — Assessment & Plan Note (Signed)
He has recurrent chest pain that seem not to be exertional. Electrocardiogram is normal catheterization 2 years ago was normal. I suspect noncardiac but will defer to Dr. Excell Seltzer

## 2012-12-06 ENCOUNTER — Ambulatory Visit: Payer: Managed Care, Other (non HMO) | Admitting: Physician Assistant

## 2012-12-07 ENCOUNTER — Ambulatory Visit: Payer: Managed Care, Other (non HMO) | Admitting: Cardiovascular Disease

## 2012-12-14 ENCOUNTER — Ambulatory Visit (INDEPENDENT_AMBULATORY_CARE_PROVIDER_SITE_OTHER): Payer: Managed Care, Other (non HMO) | Admitting: Cardiovascular Disease

## 2012-12-14 ENCOUNTER — Encounter: Payer: Self-pay | Admitting: Cardiovascular Disease

## 2012-12-14 VITALS — BP 120/88 | HR 96 | Ht 73.0 in | Wt 189.0 lb

## 2012-12-14 DIAGNOSIS — I70219 Atherosclerosis of native arteries of extremities with intermittent claudication, unspecified extremity: Secondary | ICD-10-CM

## 2012-12-14 NOTE — Patient Instructions (Signed)
Your physician wants you to follow-up in: 1 YEAR with Dr Cooper.  You will receive a reminder letter in the mail two months in advance. If you don't receive a letter, please call our office to schedule the follow-up appointment.  Your physician recommends that you continue on your current medications as directed. Please refer to the Current Medication list given to you today.  

## 2012-12-14 NOTE — Progress Notes (Signed)
   HPI:  52 year old gentleman presenting for followup evaluation. He has lower extremity occlusive arterial disease and is maintained on chronic warfarin. He underwent embolectomy of the left femoral and popliteal arteries in 2008. He's also had a renal infarction. He has a underlying hypercoagulable state and is treated with long-term warfarin. He's undergone cardiac catheterization in 2012 for chronic chest pain and was found to have widely patent coronary arteries.  He complains of pain in his left heel. He has chest and back pain with certain movements. He denies symptoms with exertion. He's had no bleeding problems. Continues to smoke.  Outpatient Encounter Prescriptions as of 12/14/2012  Medication Sig Dispense Refill  . acetaminophen-codeine (TYLENOL #3) 300-30 MG per tablet Take 1 tablet by mouth every 4 (four) hours as needed for pain.  20 tablet  0  . omeprazole (PRILOSEC) 40 MG capsule Take 1 capsule (40 mg total) by mouth daily.  30 capsule  11  . warfarin (COUMADIN) 10 MG tablet Take 1 tablet (10 mg total) by mouth as directed.  30 tablet  3   No facility-administered encounter medications on file as of 12/14/2012.    Allergies  Allergen Reactions  . Pravastatin     Itching   . Simvastatin     Itching     Past Medical History  Diagnosis Date  . PAD (peripheral artery disease)     a. left leg ischemia tx wtih embolectomy of left fem, pop, tib arteries with Dr. Edilia Bo - 08/2006;  b. known occlusion of right pop with collats - med Rx;  c.ABI's 8/09: R 1.0; L 0.91   . Renal infarct   . History of DVT of lower extremity   . Chest pain     LHC 5/12; minimal LAD irregs; no obs CAD; normal LVF  . Hypertension   . Internal hemorrhoids     ROS: Negative except as per HPI  BP 120/88  Pulse 96  Ht 6\' 1"  (1.854 m)  Wt 85.73 kg (189 lb)  BMI 24.94 kg/m2  SpO2 98%  PHYSICAL EXAM: Pt is alert and oriented, NAD HEENT: normal Neck: JVP - normal, carotids 2+= without  bruits Lungs: CTA bilaterally CV: RRR without murmur or gallop Abd: soft, NT, Positive BS, no hepatomegaly Ext: no C/C/E, distal pulses intact and equal Skin: warm/dry no rash  Lower arterial study 11/26/2012: ABIs are 1.1 bilaterally. 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 Lower extremity peripheral arterial disease. We again discussed the importance of complete tobacco cessation. We talked about this many times before. He will remain on warfarin. He has an easily palpable DP pulse on the left with normal ABIs. I think his heel pain is unrelated to PAD. He does a lot of standing or walking at work. I'm going to check with Dr. Edilia Bo to make sure his common femoral artery aneurysm at the site of previous vein patch repair is not a major issue.  2. Chest pain. This is clearly musculoskeletal. He had an essentially normal cardiac catheterization a few years back.  [name] 12/14/2012 3:34 PM

## 2013-01-09 ENCOUNTER — Ambulatory Visit (INDEPENDENT_AMBULATORY_CARE_PROVIDER_SITE_OTHER): Payer: Managed Care, Other (non HMO) | Admitting: *Deleted

## 2013-01-09 DIAGNOSIS — Z7901 Long term (current) use of anticoagulants: Secondary | ICD-10-CM

## 2013-01-09 LAB — POCT INR: INR: 2.7

## 2013-01-09 MED ORDER — WARFARIN SODIUM 10 MG PO TABS: ORAL_TABLET | ORAL | Status: DC

## 2013-01-30 ENCOUNTER — Ambulatory Visit (INDEPENDENT_AMBULATORY_CARE_PROVIDER_SITE_OTHER): Payer: Managed Care, Other (non HMO) | Admitting: *Deleted

## 2013-01-30 DIAGNOSIS — Z7901 Long term (current) use of anticoagulants: Secondary | ICD-10-CM

## 2013-01-30 LAB — POCT INR: INR: 2

## 2013-03-30 ENCOUNTER — Other Ambulatory Visit: Payer: Self-pay | Admitting: Cardiovascular Disease

## 2013-04-01 NOTE — Telephone Encounter (Signed)
Made appt for coumadin check tomorrow so patient can get refill, he is delinquent

## 2013-04-02 ENCOUNTER — Ambulatory Visit (INDEPENDENT_AMBULATORY_CARE_PROVIDER_SITE_OTHER): Payer: Managed Care, Other (non HMO) | Admitting: *Deleted

## 2013-04-02 DIAGNOSIS — Z7901 Long term (current) use of anticoagulants: Secondary | ICD-10-CM

## 2013-04-02 MED ORDER — WARFARIN SODIUM 10 MG PO TABS: ORAL_TABLET | ORAL | Status: DC

## 2013-05-24 ENCOUNTER — Ambulatory Visit (INDEPENDENT_AMBULATORY_CARE_PROVIDER_SITE_OTHER): Payer: Managed Care, Other (non HMO) | Admitting: *Deleted

## 2013-05-24 DIAGNOSIS — Z7901 Long term (current) use of anticoagulants: Secondary | ICD-10-CM

## 2013-05-24 MED ORDER — WARFARIN SODIUM 10 MG PO TABS: ORAL_TABLET | ORAL | Status: DC

## 2013-07-05 ENCOUNTER — Ambulatory Visit (INDEPENDENT_AMBULATORY_CARE_PROVIDER_SITE_OTHER): Payer: Managed Care, Other (non HMO) | Admitting: Pharmacist

## 2013-07-05 DIAGNOSIS — Z7901 Long term (current) use of anticoagulants: Secondary | ICD-10-CM

## 2013-07-05 MED ORDER — WARFARIN SODIUM 10 MG PO TABS: ORAL_TABLET | ORAL | Status: DC

## 2013-08-02 ENCOUNTER — Ambulatory Visit (INDEPENDENT_AMBULATORY_CARE_PROVIDER_SITE_OTHER): Payer: Managed Care, Other (non HMO) | Admitting: *Deleted

## 2013-08-02 DIAGNOSIS — Z7901 Long term (current) use of anticoagulants: Secondary | ICD-10-CM

## 2013-09-05 ENCOUNTER — Ambulatory Visit (INDEPENDENT_AMBULATORY_CARE_PROVIDER_SITE_OTHER): Payer: Managed Care, Other (non HMO)

## 2013-09-05 DIAGNOSIS — Z5181 Encounter for therapeutic drug level monitoring: Secondary | ICD-10-CM | POA: Insufficient documentation

## 2013-09-05 DIAGNOSIS — I82409 Acute embolism and thrombosis of unspecified deep veins of unspecified lower extremity: Secondary | ICD-10-CM

## 2013-09-05 DIAGNOSIS — Z7901 Long term (current) use of anticoagulants: Secondary | ICD-10-CM

## 2013-09-05 LAB — POCT INR: INR: 1.9

## 2013-09-05 MED ORDER — WARFARIN SODIUM 10 MG PO TABS: ORAL_TABLET | ORAL | Status: DC

## 2013-10-31 ENCOUNTER — Ambulatory Visit (INDEPENDENT_AMBULATORY_CARE_PROVIDER_SITE_OTHER): Payer: Managed Care, Other (non HMO) | Admitting: *Deleted

## 2013-10-31 DIAGNOSIS — Z5181 Encounter for therapeutic drug level monitoring: Secondary | ICD-10-CM

## 2013-10-31 DIAGNOSIS — I82409 Acute embolism and thrombosis of unspecified deep veins of unspecified lower extremity: Secondary | ICD-10-CM

## 2013-10-31 DIAGNOSIS — Z7901 Long term (current) use of anticoagulants: Secondary | ICD-10-CM

## 2013-10-31 LAB — POCT INR: INR: 1.5

## 2013-10-31 MED ORDER — WARFARIN SODIUM 10 MG PO TABS: ORAL_TABLET | ORAL | Status: DC

## 2013-11-29 ENCOUNTER — Encounter: Payer: Self-pay | Admitting: Internal Medicine

## 2013-11-29 ENCOUNTER — Ambulatory Visit (INDEPENDENT_AMBULATORY_CARE_PROVIDER_SITE_OTHER): Payer: Managed Care, Other (non HMO) | Admitting: Internal Medicine

## 2013-11-29 VITALS — BP 114/81 | HR 77 | Temp 98.0°F | Wt 191.0 lb

## 2013-11-29 DIAGNOSIS — F1910 Other psychoactive substance abuse, uncomplicated: Secondary | ICD-10-CM

## 2013-11-29 DIAGNOSIS — R110 Nausea: Secondary | ICD-10-CM

## 2013-11-29 DIAGNOSIS — R112 Nausea with vomiting, unspecified: Secondary | ICD-10-CM | POA: Insufficient documentation

## 2013-11-29 LAB — CBC WITH DIFFERENTIAL/PLATELET
BASOS ABS: 0 10*3/uL (ref 0.0–0.1)
Basophils Relative: 0.6 % (ref 0.0–3.0)
Eosinophils Absolute: 0.1 10*3/uL (ref 0.0–0.7)
Eosinophils Relative: 1.5 % (ref 0.0–5.0)
HEMATOCRIT: 43.7 % (ref 39.0–52.0)
Hemoglobin: 14.7 g/dL (ref 13.0–17.0)
LYMPHS ABS: 1.8 10*3/uL (ref 0.7–4.0)
Lymphocytes Relative: 34.8 % (ref 12.0–46.0)
MCHC: 33.7 g/dL (ref 30.0–36.0)
MCV: 93.6 fl (ref 78.0–100.0)
MONO ABS: 0.4 10*3/uL (ref 0.1–1.0)
Monocytes Relative: 6.8 % (ref 3.0–12.0)
Neutro Abs: 2.9 10*3/uL (ref 1.4–7.7)
Neutrophils Relative %: 56.3 % (ref 43.0–77.0)
PLATELETS: 200 10*3/uL (ref 150.0–400.0)
RBC: 4.67 Mil/uL (ref 4.22–5.81)
RDW: 14.4 % (ref 11.5–14.6)
WBC: 5.2 10*3/uL (ref 4.5–10.5)

## 2013-11-29 NOTE — Progress Notes (Signed)
Subjective:    Patient ID: [name], male    DOB: 07-Jan-1961, 53 y.o.   MRN: [id number]  DOS:  11/29/2013 Type of  visit: Last visit 2013, He  scheduled this visit to discuss the following: States he regurgitates every morning, either food or phlegm , has abdominal pain on and off at different places: Epigastrium, right upper quadrant, left lower quadrant, on and off. Last week had a single episode of dark stools. He continued to drink heavily "I already had 2 beers his morning"   ROS No fever or chills Has chronic chest pain, not worse or better No lower extremity edema or palpitations. Despite or regurgitation he reports no actual heartburn throughout the day. No fresh or red blood in the stools. He does have on and off nausea, mostly in the mornings. Weight has been stable. No dysuria gross hematuria.    Past Medical History  Diagnosis Date  . PAD (peripheral artery disease)     a. left leg ischemia tx wtih embolectomy of left fem, pop, tib arteries with Dr. Scot Dock - 08/2006;  b. known occlusion of right pop with collats - med Rx;  c.ABI's 8/09: R 1.0; L 0.91   . Renal infarct     R  . History of DVT of lower extremity     on chronic coumadin  . Chest pain     stres test neg x 2, cath 5/12; minimal LAD irregs; no obs CAD; normal LVF  . Hypertension   . Internal hemorrhoids     Cscope 2007  . Polysubstance abuse     Past Surgical History  Procedure Laterality Date  . Cholecystectomy    . Left leg blood clot removal 2009  2009  . Tonsillectomy    . Wrist surgery      History   Social History  . Marital Status: Married    Spouse Name: N/A    Number of Children: 2  . Years of Education: N/A   Occupational History  . [employer]    Social History Main Topics  . Smoking status: Current Every Day Smoker -- 1.00 packs/day for 30 years    Types: Cigarettes  . Smokeless tobacco: Never Used  . Alcohol Use: 15.0 oz/week    25 Cans of beer per  week     Comment: +++ abuse  . Drug Use: No     Comment: h/o abuse, denies at this time 11-2013  . Sexual Activity: Not on file   Other Topics Concern  . Not on file   Social History Narrative  . No narrative on file        Medication List       This list is accurate as of: 11/29/13 11:59 PM.  Always use your most recent med list.               acetaminophen-codeine 300-30 MG per tablet  Commonly known as:  TYLENOL #3  Take 1 tablet by mouth every 4 (four) hours as needed for pain.     omeprazole 40 MG capsule  Commonly known as:  PRILOSEC  Take 1 capsule (40 mg total) by mouth daily.     warfarin 10 MG tablet  Commonly known as:  COUMADIN  Take as directed by coumadin clinic           Objective:   Physical Exam BP 114/81  Pulse 77  Temp(Src) 98 F (36.7 C)  Wt 191 lb (86.637 kg)  SpO2 100% General -- alert, well-developed, NAD.   Lungs -- normal respiratory effort, no intercostal retractions, no accessory muscle use, and normal breath sounds.  Heart-- normal rate, regular rhythm, no murmur.  Abdomen-- Not distended, good bowel sounds,soft, non-tender. No mass,organomegaly.  Extremities-- no pretibial edema bilaterally  Neurologic--  alert & oriented X3. Speech normal, gait normal, strength normal in all extremities.  Psych-- Cognition and judgment appear intact. Cooperative with normal attention span and concentration. No anxious or depressed appearing.        Assessment & Plan:

## 2013-11-29 NOTE — Assessment & Plan Note (Addendum)
Patient continue with heavy drinking "at least one bottle of wine a day, several beers and 2 shots" He also smokes a pack a day and smokes marijuana. In no uncertain terms I told the patient his habits are  a major risk for his health, probably will develop cirrhosis, Liver, esophageal or gastric cancer.  Also risk of CAD, lung cancer, bladder cancer, stroke etc. I think he needs professional help and contacts numbers were provided. At this point he still does not think he has an abuse issue

## 2013-11-29 NOTE — Progress Notes (Signed)
Pre visit review using our clinic review tool, if applicable. No additional management support is needed unless otherwise documented below in the visit note. 

## 2013-11-29 NOTE — Assessment & Plan Note (Addendum)
Chronic regurgitation in the morning, on and off abdominal pain and morning nausea. Likely related to heavy alcohol use. He also had a single episode of dark stools last week, on today's DRE I did not find any stools or blood. Plan: Labs. Alcohol abstinence. Continue omeprazole. GI referral, in the setting of alcohol abuse, dark stools could be related to PUD. Ultrasound to  document liver status, spleen size

## 2013-11-29 NOTE — Patient Instructions (Signed)
Get your blood work before you leave     Next visit is for routine check up in 3 months   [facility] Address: [address] [place], [place] [address]  Phone: [phone]

## 2013-11-30 ENCOUNTER — Encounter: Payer: Self-pay | Admitting: Internal Medicine

## 2013-12-02 ENCOUNTER — Telehealth: Payer: Self-pay | Admitting: Internal Medicine

## 2013-12-02 LAB — COMPREHENSIVE METABOLIC PANEL
ALK PHOS: 59 U/L (ref 39–117)
ALT: 21 U/L (ref 0–53)
AST: 34 U/L (ref 0–37)
Albumin: 4.4 g/dL (ref 3.5–5.2)
BILIRUBIN TOTAL: 0.2 mg/dL — AB (ref 0.3–1.2)
BUN: 12 mg/dL (ref 6–23)
CO2: 24 mEq/L (ref 19–32)
Calcium: 9.4 mg/dL (ref 8.4–10.5)
Chloride: 105 mEq/L (ref 96–112)
Creatinine, Ser: 1 mg/dL (ref 0.4–1.5)
GFR: 97.99 mL/min (ref >60.00)
Glucose, Bld: 76 mg/dL (ref 70–99)
POTASSIUM: 3.8 meq/L (ref 3.5–5.1)
SODIUM: 140 meq/L (ref 135–145)
TOTAL PROTEIN: 7.3 g/dL (ref 6.0–8.3)

## 2013-12-02 LAB — TSH: TSH: 1.07 u[IU]/mL (ref 0.35–5.50)

## 2013-12-02 NOTE — Telephone Encounter (Signed)
Relevant patient education assigned to patient using Emmi. ° °

## 2013-12-09 ENCOUNTER — Ambulatory Visit
Admission: RE | Admit: 2013-12-09 | Discharge: 2013-12-09 | Disposition: A | Discharge status: Home / Self Care | Payer: Managed Care, Other (non HMO) | Source: Ambulatory Visit | Attending: Internal Medicine | Admitting: Internal Medicine

## 2013-12-09 DIAGNOSIS — R110 Nausea: Secondary | ICD-10-CM

## 2013-12-13 ENCOUNTER — Ambulatory Visit (INDEPENDENT_AMBULATORY_CARE_PROVIDER_SITE_OTHER): Payer: Managed Care, Other (non HMO) | Admitting: Pharmacist

## 2013-12-13 DIAGNOSIS — Z5181 Encounter for therapeutic drug level monitoring: Secondary | ICD-10-CM

## 2013-12-13 DIAGNOSIS — Z7901 Long term (current) use of anticoagulants: Secondary | ICD-10-CM

## 2013-12-13 DIAGNOSIS — I82409 Acute embolism and thrombosis of unspecified deep veins of unspecified lower extremity: Secondary | ICD-10-CM

## 2013-12-13 LAB — POCT INR: INR: 2.3

## 2013-12-13 MED ORDER — WARFARIN SODIUM 10 MG PO TABS: ORAL_TABLET | ORAL | Status: DC

## 2013-12-16 ENCOUNTER — Encounter: Payer: Self-pay | Admitting: Gastroenterology

## 2013-12-16 ENCOUNTER — Ambulatory Visit (INDEPENDENT_AMBULATORY_CARE_PROVIDER_SITE_OTHER): Payer: Managed Care, Other (non HMO) | Admitting: Gastroenterology

## 2013-12-16 ENCOUNTER — Telehealth: Payer: Self-pay

## 2013-12-16 VITALS — BP 126/88 | HR 76 | Ht 71.0 in | Wt 192.2 lb

## 2013-12-16 DIAGNOSIS — R112 Nausea with vomiting, unspecified: Secondary | ICD-10-CM

## 2013-12-16 DIAGNOSIS — R634 Abnormal weight loss: Secondary | ICD-10-CM

## 2013-12-16 DIAGNOSIS — K219 Gastro-esophageal reflux disease without esophagitis: Secondary | ICD-10-CM

## 2013-12-16 MED ORDER — ESOMEPRAZOLE MAGNESIUM 40 MG PO CPDR: 40.0000 mg | DELAYED_RELEASE_CAPSULE | Freq: Every evening | ORAL | Status: DC

## 2013-12-16 NOTE — Telephone Encounter (Signed)
May hold warfarin 5 days prior to procedure. thx  Goldman Birchall 12/16/2013 5:20 PM

## 2013-12-16 NOTE — Progress Notes (Signed)
    History of Present Illness: This is a 53 year old male accompanied by his wife. He has ongoing problems with nighttime reflux and early morning awakening with nausea and vomiting. He abuses alcohol and smokes cigarettes. I saw him in November 2013 for similar symptoms and he was advised to take a PPI, follow antireflux measures, discontinue cigarette smoking and to strictly limit alcohol usage. He underwent colonoscopy 2007 showing only internal hemorrhoids. Denies weight loss, abdominal pain, constipation, diarrhea, change in stool caliber, melena, hematochezia, dysphagia, chest pain.  Current Medications, Allergies, Past Medical History, Past Surgical History, Family History and Social History were reviewed in [employer] record.  Physical Exam: General: Well developed , well nourished, no acute distress Head: Normocephalic and atraumatic Eyes:  sclerae anicteric, EOMI Ears: Normal auditory acuity Mouth: No deformity or lesions Lungs: Clear throughout to auscultation Heart: Regular rate and rhythm; no murmurs, rubs or bruits Abdomen: Soft, non tender and non distended. No masses, hepatosplenomegaly or hernias noted. Normal Bowel sounds Musculoskeletal: Symmetrical with no gross deformities  Pulses:  Normal pulses noted Extremities: No clubbing, cyanosis, edema or deformities noted Neurological: Alert oriented x 4, grossly nonfocal Psychological:  Alert and cooperative. Normal mood and affect  Assessment and Recommendations:  1. GERD associated with early morning nausea and vomiting. Rule out esophagitis, ulcer disease. I've advised him to discontinue cigarette smoking and to discontinue alcohol usage. He is advised to followup with his PCP for alcohol and cigarette cessation support. Avoid NSAID usage. Begin Nexium 40 mg daily taking before his evening meal. Schedule upper endoscopy. The risks, benefits, and alternatives to endoscopy with possible biopsy and  possible dilation were discussed with the patient and they consent to proceed.   2 Chronic Coumadin anticoagulation for history of DVT. The risks, benefits and alternatives to a 5 day hold Coumadin were discussed with the patient and he consents to proceed. Obtain clearance from his prescribing physician.   3. Colorectal cancer screening, average risk. Recommend colonoscopy at 10 year interval in May 2017.

## 2013-12-16 NOTE — Patient Instructions (Addendum)
You have been given a separate informational sheet regarding your tobacco use, the importance of quitting and local resources to help you quit.  Discontinue alcohol and cigarette smoking.  We have sent the following medications to your pharmacy for you to pick up at your convenience:Nexium to take one tablet by mouth once daily in the evening.  You have been scheduled for an endoscopy with propofol. Please follow written instructions given to you at your visit today. If you use inhalers (even only as needed), please bring them with you on the day of your procedure. Your physician has requested that you go to www.startemmi.com and enter the access code given to you at your visit today. This web site gives a general overview about your procedure. However, you should still follow specific instructions given to you by our office regarding your preparation for the procedure.  You will be contaced by our office prior to your procedure for directions on holding your Coumadin/Warfarin.  If you do not hear from our office 1 week prior to your scheduled procedure, please call [phone] to discuss.  Thank you for choosing me and [facility].  Pricilla Riffle. Dagoberto Ligas., MD., Marval Regal

## 2013-12-16 NOTE — Telephone Encounter (Signed)
12/16/2013   RE: [name] DOB: 1961/03/11 MRN: [id number]   Dear Dr. Burt Knack,    We have scheduled the above patient for an endoscopic procedure. Our records show that he is on anticoagulation therapy.   Please advise as to how long the patient may come off his therapy of coumadin prior to the procedure, which is scheduled for 01/15/14.  Please fax back/ or route the completed form to [place] at [phone].   Sincerely,    Marzella Schlein, CMA

## 2013-12-17 NOTE — Telephone Encounter (Signed)
Left a message for patient to return my call. 

## 2013-12-17 NOTE — Telephone Encounter (Signed)
Patient notified to hold coumadin 5 days before his procedure. Pt verbalized understanding.

## 2013-12-24 ENCOUNTER — Encounter: Payer: Self-pay | Admitting: Gastroenterology

## 2014-01-01 ENCOUNTER — Emergency Department (HOSPITAL_COMMUNITY): Payer: Managed Care, Other (non HMO)

## 2014-01-01 ENCOUNTER — Emergency Department (HOSPITAL_COMMUNITY)
Admission: EM | Admit: 2014-01-01 | Discharge: 2014-01-01 | Disposition: A | Discharge status: Home / Self Care | Payer: Managed Care, Other (non HMO) | Attending: Emergency Medicine | Admitting: Emergency Medicine

## 2014-01-01 ENCOUNTER — Encounter (HOSPITAL_COMMUNITY): Payer: Self-pay | Admitting: Emergency Medicine

## 2014-01-01 DIAGNOSIS — Z86718 Personal history of other venous thrombosis and embolism: Secondary | ICD-10-CM | POA: Insufficient documentation

## 2014-01-01 DIAGNOSIS — Z7901 Long term (current) use of anticoagulants: Secondary | ICD-10-CM | POA: Insufficient documentation

## 2014-01-01 DIAGNOSIS — Z79899 Other long term (current) drug therapy: Secondary | ICD-10-CM | POA: Insufficient documentation

## 2014-01-01 DIAGNOSIS — Z87448 Personal history of other diseases of urinary system: Secondary | ICD-10-CM | POA: Insufficient documentation

## 2014-01-01 DIAGNOSIS — R079 Chest pain, unspecified: Secondary | ICD-10-CM | POA: Insufficient documentation

## 2014-01-01 DIAGNOSIS — F17200 Nicotine dependence, unspecified, uncomplicated: Secondary | ICD-10-CM | POA: Insufficient documentation

## 2014-01-01 DIAGNOSIS — I10 Essential (primary) hypertension: Secondary | ICD-10-CM | POA: Insufficient documentation

## 2014-01-01 LAB — BASIC METABOLIC PANEL
BUN: 17 mg/dL (ref 6–23)
CALCIUM: 9.6 mg/dL (ref 8.4–10.5)
CO2: 24 mEq/L (ref 19–32)
Chloride: 101 mEq/L (ref 96–112)
Creatinine, Ser: 1.13 mg/dL (ref 0.50–1.35)
GFR calc Af Amer: 86 mL/min — ABNORMAL LOW (ref >90)
GFR calc non Af Amer: 74 mL/min — ABNORMAL LOW (ref >90)
GLUCOSE: 89 mg/dL (ref 70–99)
Potassium: 4.3 mEq/L (ref 3.7–5.3)
Sodium: 141 mEq/L (ref 137–147)

## 2014-01-01 LAB — CBC
HCT: 42.8 % (ref 39.0–52.0)
Hemoglobin: 14.7 g/dL (ref 13.0–17.0)
MCH: 31.9 pg (ref 26.0–34.0)
MCHC: 34.3 g/dL (ref 30.0–36.0)
MCV: 92.8 fL (ref 78.0–100.0)
PLATELETS: 236 10*3/uL (ref 150–400)
RBC: 4.61 MIL/uL (ref 4.22–5.81)
RDW: 14.1 % (ref 11.5–15.5)
WBC: 6.7 10*3/uL (ref 4.0–10.5)

## 2014-01-01 LAB — PROTIME-INR
INR: 1.33 (ref 0.00–1.49)
Prothrombin Time: 16.2 seconds — ABNORMAL HIGH (ref 11.6–15.2)

## 2014-01-01 LAB — I-STAT TROPONIN, ED
TROPONIN I, POC: 0.01 ng/mL (ref 0.00–0.08)
Troponin i, poc: 0 ng/mL (ref 0.00–0.08)

## 2014-01-01 LAB — PRO B NATRIURETIC PEPTIDE: PRO B NATRI PEPTIDE: 11.6 pg/mL (ref 0–125)

## 2014-01-01 MED ORDER — SODIUM CHLORIDE 0.9 % IV BOLUS (SEPSIS)
1000.0000 mL | Freq: Once | INTRAVENOUS | Status: AC
  Administered 2014-01-01: 1000 mL via INTRAVENOUS

## 2014-01-01 MED ORDER — IOHEXOL 350 MG/ML SOLN
80.0000 mL | Freq: Once | INTRAVENOUS | Status: AC | PRN
  Administered 2014-01-01: 75 mL via INTRAVENOUS

## 2014-01-01 MED ORDER — ASPIRIN 81 MG PO CHEW
324.0000 mg | CHEWABLE_TABLET | Freq: Once | ORAL | Status: AC
  Administered 2014-01-01: 324 mg via ORAL
  Filled 2014-01-01: qty 4

## 2014-01-01 MED ORDER — NITROGLYCERIN 0.4 MG SL SUBL: 0.4000 mg | SUBLINGUAL_TABLET | SUBLINGUAL | Status: DC | PRN

## 2014-01-01 NOTE — ED Notes (Signed)
Pt transported to CT ?

## 2014-01-01 NOTE — ED Provider Notes (Signed)
TIME SEEN: 3:09 PM  CHIEF COMPLAINT: Chest pain, shortness of breath  HPI: Patient is a 53 year old male with a history of hypertension, tobacco use, peripheral arterial disease, prior DVT on lifelong Coumadin who presents emergency Department left-sided chest pain that started at 8 this morning while at work. He describes it as a squeezing pain that radiates into his arm. It is exertional but also pleuritic. It is associated with shortness of breath and nausea but no diaphoresis or dizziness. He states his last episode of chest pain was at 11 AM today and is now resolved. He denies any change in his chronic cough. No lower extremity swelling. He has not missed any doses of Coumadin.  No fever.  His PCP is Dr. Otelia Santee with [facility] His cardiologist is Dr. Burt Knack   ROS: See HPI Constitutional: no fever  Eyes: no drainage  ENT: no runny nose   Cardiovascular:   chest pain  Resp: SOB  GI: no vomiting GU: no dysuria Integumentary: no rash  Allergy: no hives  Musculoskeletal: no leg swelling  Neurological: no slurred speech ROS otherwise negative  PAST MEDICAL HISTORY/PAST SURGICAL HISTORY:  Past Medical History  Diagnosis Date  . PAD (peripheral artery disease)     a. left leg ischemia tx wtih embolectomy of left fem, pop, tib arteries with Dr. Scot Dock - 08/2006;  b. known occlusion of right pop with collats - med Rx;  c.ABI's 8/09: R 1.0; L 0.91   . Renal infarct     R  . History of DVT of lower extremity     on chronic coumadin  . Chest pain     stres test neg x 2, cath 5/12; minimal LAD irregs; no obs CAD; normal LVF  . Hypertension   . Internal hemorrhoids     Cscope 2007  . Polysubstance abuse     MEDICATIONS:  Prior to Admission medications   Medication Sig Start Date End Date Taking? Authorizing Provider  esomeprazole (NEXIUM) 40 MG capsule Take 1 capsule (40 mg total) by mouth every evening. 12/16/13  Yes Ladene Artist, MD  oxyCODONE-acetaminophen (PERCOCET) 10-325 MG per  tablet Take 1 tablet by mouth 2 (two) times daily as needed for pain.   Yes Historical Provider, MD  Tetrahydrozoline HCl (VISINE OP) Place 1 drop into both eyes daily as needed (dry eyes).   Yes Historical Provider, MD  warfarin (COUMADIN) 10 MG tablet Take 5-10 mg by mouth daily. Take 5mg  on Wednesday and Saturday. All other days take 10mg .   Yes Historical Provider, MD    ALLERGIES:  Allergies  Allergen Reactions  . Pravastatin Itching  . Simvastatin Itching    SOCIAL HISTORY:  History  Substance Use Topics  . Smoking status: Current Every Day Smoker -- 1.00 packs/day for 30 years    Types: Cigarettes  . Smokeless tobacco: Never Used  . Alcohol Use: 15.0 oz/week    25 Cans of beer per week     Comment: +++ abuse    FAMILY HISTORY: Family History  Problem Relation Age of Onset  . Hypertension Mother   . Hypertension Father   . Lung cancer Maternal Uncle   . Pancreatic cancer Brother     EXAM: BP 129/87  Pulse 73  Temp(Src) 98.3 F (36.8 C) (Oral)  Resp 13  Ht 6\' 4"  (1.93 m)  Wt 195 lb (88.451 kg)  BMI 23.75 kg/m2  SpO2 100% CONSTITUTIONAL: Alert and oriented and responds appropriately to questions. Well-appearing; well-nourished HEAD: Normocephalic EYES:  Conjunctivae clear, PERRL ENT: normal nose; no rhinorrhea; moist mucous membranes; pharynx without lesions noted NECK: Supple, no meningismus, no LAD  CARD: RRR; S1 and S2 appreciated; no murmurs, no clicks, no rubs, no gallops RESP: Normal chest excursion without splinting or tachypnea; breath sounds clear and equal bilaterally; no wheezes, no rhonchi, no rales, chest wall is nontender to palpation without crepitus or ecchymosis or deformity ABD/GI: Normal bowel sounds; non-distended; soft, non-tender, no rebound, no guarding BACK:  The back appears normal and is non-tender to palpation, there is no CVA tenderness EXT: Normal ROM in all joints; non-tender to palpation; no edema; normal capillary refill; no  cyanosis    SKIN: Normal color for age and race; warm NEURO: Moves all extremities equally PSYCH: The patient's mood and manner are appropriate. Grooming and personal hygiene are appropriate.  MEDICAL DECISION MAKING: Patient here with episode of chest pain this morning. Differential diagnosis includes ACS, pulmonary embolus. Less likely dissection given he is currently pain-free. Pneumonia is also on the differential. Patient's initial set of cardiac labs are unremarkable. Troponin negative. His chest x-ray is clear. We'll give full dose aspirin. Given he has had a history of multiple DVTs in the past, will obtain a CT of his chest to rule out PE. We'll repeat a second troponin at 5 PM, 6 hours after the onset of the last episode of chest pain. Will consult his cardiologist. Patient states he does not want to be admitted to the hospital at this time. He states his last stress test was less than a year ago and was negative.      EKG Interpretation  Date/Time:  Wednesday Jan 01 2014 11:29:22 EDT Ventricular Rate:  87 PR Interval:  164 QRS Duration: 84 QT Interval:  368 QTC Calculation: 442 R Axis:   36 Text Interpretation:  Normal sinus rhythm Normal ECG Confirmed by Aryav Wimberly,  DO, Graceyn Fodor ([id number]) on 01/01/2014 3:05:36 PM      ED PROGRESS: Pt has return of CP.  Will give NTG.  Repeat EKG unchanged.   6:54 PM  Pt's episode of chest pain resolved without nitroglycerin. He is currently chest pain-free. His second troponin is negative and his CT chest shows no pulmonary embolus, pneumothorax, infiltrate, edema or dissection. Discussed with Dr. Mare Ferrari with cardiology who was unable to find patient's recent stress test in our computer system the patient reports it was within the last year and was negative. Dr. Mare Ferrari recommends patient followup with Dr. Burt Knack tomorrow for outpatient appointment and repeat stress test. Discussed with patient who agrees with plan and is pleased with the plan to be  discharged home at this time refuses admission. Have discussed strict return precautions. Patient and wife verbalize understanding and are comfortable with plan.    EKG Interpretation  Date/Time:  Wednesday Jan 01 2014 15:19:58 EDT Ventricular Rate:  81 PR Interval:  208 QRS Duration: 73 QT Interval:  383 QTC Calculation: 445 R Axis:   31 Text Interpretation:  Sinus rhythm Borderline prolonged PR interval No significant change since last tracing Confirmed by Jermeka Schlotterbeck,  DO, Lorah Kalina [phone]) on 01/01/2014 3:38:45 PM         [place], DO 01/01/14 1859

## 2014-01-01 NOTE — Progress Notes (Signed)
Dr. Mare Ferrari relayed request for followup appt for CP. I have left a message on our office's scheduling voicemail requesting a follow-up appointment, and our office will call the patient with this appointment. Dayna Dunn PA-C

## 2014-01-01 NOTE — Discharge Instructions (Signed)
Chest Pain (Nonspecific) °It is often hard to give a specific diagnosis for the cause of chest pain. There is always a chance that your pain could be related to something serious, such as a heart attack or a blood clot in the lungs. You need to follow up with your caregiver for further evaluation. °CAUSES  °· Heartburn. °· Pneumonia or bronchitis. °· Anxiety or stress. °· Inflammation around your heart (pericarditis) or lung (pleuritis or pleurisy). °· A blood clot in the lung. °· A collapsed lung (pneumothorax). It can develop suddenly on its own (spontaneous pneumothorax) or from injury (trauma) to the chest. °· Shingles infection (herpes zoster virus). °The chest wall is composed of bones, muscles, and cartilage. Any of these can be the source of the pain. °· The bones can be bruised by injury. °· The muscles or cartilage can be strained by coughing or overwork. °· The cartilage can be affected by inflammation and become sore (costochondritis). °DIAGNOSIS  °Lab tests or other studies, such as X-rays, electrocardiography, stress testing, or cardiac imaging, may be needed to find the cause of your pain.  °TREATMENT  °· Treatment depends on what may be causing your chest pain. Treatment may include: °· Acid blockers for heartburn. °· Anti-inflammatory medicine. °· Pain medicine for inflammatory conditions. °· Antibiotics if an infection is present. °· You may be advised to change lifestyle habits. This includes stopping smoking and avoiding alcohol, caffeine, and chocolate. °· You may be advised to keep your head raised (elevated) when sleeping. This reduces the chance of acid going backward from your stomach into your esophagus. °· Most of the time, nonspecific chest pain will improve within 2 to 3 days with rest and mild pain medicine. °HOME CARE INSTRUCTIONS  °· If antibiotics were prescribed, take your antibiotics as directed. Finish them even if you start to feel better. °· For the next few days, avoid physical  activities that bring on chest pain. Continue physical activities as directed. °· Do not smoke. °· Avoid drinking alcohol. °· Only take over-the-counter or prescription medicine for pain, discomfort, or fever as directed by your caregiver. °· Follow your caregiver's suggestions for further testing if your chest pain does not go away. °· Keep any follow-up appointments you made. If you do not go to an appointment, you could develop lasting (chronic) problems with pain. If there is any problem keeping an appointment, you must call to reschedule. °SEEK MEDICAL CARE IF:  °· You think you are having problems from the medicine you are taking. Read your medicine instructions carefully. °· Your chest pain does not go away, even after treatment. °· You develop a rash with blisters on your chest. °SEEK IMMEDIATE MEDICAL CARE IF:  °· You have increased chest pain or pain that spreads to your arm, neck, jaw, back, or abdomen. °· You develop shortness of breath, an increasing cough, or you are coughing up blood. °· You have severe back or abdominal pain, feel nauseous, or vomit. °· You develop severe weakness, fainting, or chills. °· You have a fever. °THIS IS AN EMERGENCY. Do not wait to see if the pain will go away. Get medical help at once. Call your local emergency services (911 in [place] Do not drive yourself to the hospital. °MAKE SURE YOU:  °· Understand these instructions. °· Will watch your condition. °· Will get help right away if you are not doing well or get worse. °Document Released: 05/04/2005 Document Revised: 10/17/2011 Document Reviewed: 02/28/2008 °Exit[employer]® Patient Information ©2014 [employer]   [employer]. ° °

## 2014-01-01 NOTE — ED Notes (Signed)
Pt reports that he has been having left sided chest pain that radiates down into his left arm. Reports that he was at work this morning when the pain started again. Reports that he had a DVT and placed on coumadin.

## 2014-01-02 ENCOUNTER — Telehealth: Payer: Self-pay | Admitting: Cardiovascular Disease

## 2014-01-02 DIAGNOSIS — R079 Chest pain, unspecified: Secondary | ICD-10-CM

## 2014-01-02 NOTE — Telephone Encounter (Signed)
Ok to order a Myoview for evaluation of chest pain to follow-up from his ER visit. thx

## 2014-01-02 NOTE — Telephone Encounter (Signed)
New problem   Pt was seen in ED and was told to call office to have stress test. Please adivse pt .

## 2014-01-02 NOTE — Telephone Encounter (Signed)
The pt was seen in the ER and discharged home to follow-up with Cardiology.  In reviewing the pt's chart his last myoview was performed 10/04/10.  Impression: Overall Impression  Exercise Capacity: Lexiscan with no exercise.  BP Response: Normal blood pressure response.  Clinical Symptoms: No chest pain  ECG Impression: No significant change beyond baseline.  Overall Impression Comments: Probable normal perfusion and soft tissue attenuation (diaphragm) Minimal improvement inferiorly does not appear significant for ischemia.  LVEF calculated at 51%. Visually appears better.  I will forward this message to Dr Burt Knack to review the pt's ER notes and determine if the pt needs a repeat myoview or if the pt needs an office visit.

## 2014-01-03 NOTE — Telephone Encounter (Signed)
Left message on machine for pt to contact the office.  Myoview scheduled on 01/15/14.

## 2014-01-07 NOTE — Telephone Encounter (Signed)
Pt scheduled for myoview on 01/15/14 and follow-up office visit with Dr Burt Knack on 02/04/14.

## 2014-01-08 ENCOUNTER — Telehealth: Payer: Self-pay

## 2014-01-08 NOTE — Telephone Encounter (Signed)
I have explained to the patient that he needs to be cleared by cardiology prior to scheduling the EGD.  He will call back after his cardiac evaluation if cleared to set up EGD .

## 2014-01-08 NOTE — Telephone Encounter (Signed)
Message copied by Marlon Pel on Wed Jan 08, 2014 10:48 AM ------      Message from: Lucio Edward T      Created: Wed Jan 08, 2014  7:34 AM      Regarding: RE: Cardiac PT       Kahlyn Shippey, see below message. Please cancel EGD until patient is seen by and cleared by cardiology. MS            ----- Message -----         From: Osvaldo Angst, CRNA         Sent: 01/08/2014   6:24 AM           To: Ladene Artist, MD      Subject: Cardiac PT                                               Dr. Fuller Plan            This gentleman was seen by you on 5/11 and scheduled for an EGD.  On 5/27 he was seen in the ED for radiating CP.  He is now scheduled for a Myoview on 6/10 and then f/u with [facility].  We would fell more comfortable if he was cleared by [facility] prior to his procedure.            Best,            Osvaldo Angst       ------

## 2014-01-08 NOTE — Telephone Encounter (Signed)
I have left a message for the patient to call back to discuss cancelling his upcoming EGD

## 2014-01-09 ENCOUNTER — Encounter: Payer: Self-pay | Admitting: Cardiovascular Disease

## 2014-01-15 ENCOUNTER — Encounter: Payer: Managed Care, Other (non HMO) | Admitting: Gastroenterology

## 2014-01-15 ENCOUNTER — Ambulatory Visit (HOSPITAL_COMMUNITY): Payer: Managed Care, Other (non HMO) | Attending: Cardiology | Admitting: Radiology

## 2014-01-15 VITALS — BP 137/99 | Ht 73.0 in | Wt 192.0 lb

## 2014-01-15 DIAGNOSIS — Z9861 Coronary angioplasty status: Secondary | ICD-10-CM | POA: Insufficient documentation

## 2014-01-15 DIAGNOSIS — I2510 Atherosclerotic heart disease of native coronary artery without angina pectoris: Secondary | ICD-10-CM | POA: Insufficient documentation

## 2014-01-15 DIAGNOSIS — I739 Peripheral vascular disease, unspecified: Secondary | ICD-10-CM | POA: Insufficient documentation

## 2014-01-15 DIAGNOSIS — R079 Chest pain, unspecified: Secondary | ICD-10-CM

## 2014-01-15 DIAGNOSIS — I10 Essential (primary) hypertension: Secondary | ICD-10-CM | POA: Insufficient documentation

## 2014-01-15 DIAGNOSIS — F17200 Nicotine dependence, unspecified, uncomplicated: Secondary | ICD-10-CM | POA: Insufficient documentation

## 2014-01-15 MED ORDER — REGADENOSON 0.4 MG/5ML IV SOLN
0.4000 mg | Freq: Once | INTRAVENOUS | Status: AC
  Administered 2014-01-15: 0.4 mg via INTRAVENOUS

## 2014-01-15 MED ORDER — TECHNETIUM TC 99M SESTAMIBI GENERIC - CARDIOLITE
10.0000 | Freq: Once | INTRAVENOUS | Status: AC | PRN
  Administered 2014-01-15: 10 via INTRAVENOUS

## 2014-01-15 MED ORDER — TECHNETIUM TC 99M SESTAMIBI GENERIC - CARDIOLITE
30.0000 | Freq: Once | INTRAVENOUS | Status: AC | PRN
  Administered 2014-01-15: 30 via INTRAVENOUS

## 2014-01-15 NOTE — Progress Notes (Signed)
[name] [address] [place], [place] [address] [phone]    Cardiology Nuclear Med Study  [name] is a 53 y.o. male     MRN : [id number]     DOB: 21-Oct-1960  Procedure Date: 01/15/2014  Nuclear Med Background Indication for Stress Test:  Evaluation for Ischemia, [facility] and [facility] 5/15 CP History:  CAD, 2012 CATH patent coronary arteries, 10/04/10 MPI: EF: 51 NL  12/12 ECHO: EF: 55-60% Cardiac Risk Factors: Hypertension, Lipids, PVD and Smoker  Symptoms:  Chest Pain   Nuclear Pre-Procedure Caffeine/Decaff Intake:  None NPO After: 8:00pm   Lungs:  clear O2 Sat: 99% on room air. IV 0.9% NS with Angio Cath:  22g  IV Site: R Wrist  IV Started by:  Crissie Figures, RN  Chest Size (in):  44 Cup Size: n/a  Height: 6\' 1"  (1.854 m)  Weight:  192 lb (87.091 kg)  BMI:  Body mass index is 25.34 kg/(m^2). Tech Comments:  N/A    Nuclear Med Study 1 or 2 day study: 1 day  Stress Test Type:  Lexiscan  Reading MD: N/A  Order Authorizing Provider:  Sherren Mocha, MD  Resting Radionuclide: Technetium 52m Sestamibi  Resting Radionuclide Dose: 11.0 mCi   Stress Radionuclide:  Technetium 68m Sestamibi  Stress Radionuclide Dose: 33.0 mCi           Stress Protocol Rest HR: 67 Stress HR: 107  Rest BP: 137/99 Stress BP: 157/111  Exercise Time (min): n/a METS: n/a   Predicted Max HR: 170 bpm % Max HR: 62.94 bpm Rate Pressure Product: 16799   Dose of Adenosine (mg):  n/a Dose of Lexiscan: 0.4 mg  Dose of Atropine (mg): n/a Dose of Dobutamine: n/a mcg/kg/min (at max HR)  Stress Test Technologist: Perrin Maltese, EMT-P  Nuclear Technologist:  Charlton Amor, CNMT     Rest Procedure:  Myocardial perfusion imaging was performed at rest 45 minutes following the intravenous administration of Technetium 53m Sestamibi. Rest ECG: NSR - Normal EKG  Stress Procedure:  The patient received IV Lexiscan 0.4 mg over 15-seconds.  Technetium 52m  Sestamibi injected at 30-seconds. This patient had sob and weird with the Lexiscan injection. Quantitative spect images were obtained after a 45 minute delay. Stress ECG: No significant change from baseline ECG  QPS Raw Data Images:  Patient motion noted. Stress Images:  There is decreased uptake in the anterior wall. Rest Images:  There is decreased uptake in the anterior wall. Subtraction (SDS):  These findings are consistent with ischemia. Transient Ischemic Dilatation (Normal <1.22):  0.97 Lung/Heart Ratio (Normal <0.45):  0.25  Quantitative Gated Spect Images QGS EDV:  114 ml QGS ESV:  50 ml  Impression Exercise Capacity:  Lexiscan with no exercise. BP Response:  Normal blood pressure response. Clinical Symptoms:  There is dyspnea. ECG Impression:  No significant ST segment change suggestive of ischemia. Comparison with Prior Nuclear Study: No images to compare  Overall Impression:  Low risk stress nuclear study Thinning of the inferior wall not thought to be significant But small area of apical ischemia with SDS 6.  LV Ejection Fraction: 51%.  LV Wall Motion:  Low normal EF No discrete RWMA   Jenkins Rouge

## 2014-01-20 ENCOUNTER — Telehealth: Payer: Self-pay | Admitting: Cardiovascular Disease

## 2014-01-20 ENCOUNTER — Telehealth: Payer: Self-pay | Admitting: Gastroenterology

## 2014-01-20 NOTE — Telephone Encounter (Signed)
Pt is aware of stress test results Horton Chin RN

## 2014-01-20 NOTE — Telephone Encounter (Signed)
Patient has been cleared according to the patient for EGD.  He has a normal/negative stress test.  He is rescheduled for EGD for 7/7.  He has follow up with Dr. Burt Knack for 02/04/14.  He is notified to call back if something changes when he sees Dr. Burt Knack.  He will hold his coumadin per previous instructions to 5 days prior to the procedure.  He verbalized understanding of instructions.

## 2014-01-20 NOTE — Telephone Encounter (Signed)
New message ° ° ° ° °Want stress test results °

## 2014-01-22 ENCOUNTER — Ambulatory Visit (INDEPENDENT_AMBULATORY_CARE_PROVIDER_SITE_OTHER): Payer: Managed Care, Other (non HMO) | Admitting: *Deleted

## 2014-01-22 DIAGNOSIS — Z7901 Long term (current) use of anticoagulants: Secondary | ICD-10-CM

## 2014-01-22 DIAGNOSIS — I82409 Acute embolism and thrombosis of unspecified deep veins of unspecified lower extremity: Secondary | ICD-10-CM

## 2014-01-22 DIAGNOSIS — Z5181 Encounter for therapeutic drug level monitoring: Secondary | ICD-10-CM

## 2014-01-22 LAB — POCT INR: INR: 1.6

## 2014-02-04 ENCOUNTER — Encounter: Payer: Self-pay | Admitting: Cardiovascular Disease

## 2014-02-04 ENCOUNTER — Ambulatory Visit (INDEPENDENT_AMBULATORY_CARE_PROVIDER_SITE_OTHER): Payer: Managed Care, Other (non HMO) | Admitting: Cardiovascular Disease

## 2014-02-04 ENCOUNTER — Ambulatory Visit (INDEPENDENT_AMBULATORY_CARE_PROVIDER_SITE_OTHER): Payer: Managed Care, Other (non HMO) | Admitting: *Deleted

## 2014-02-04 VITALS — BP 129/87 | HR 84 | Ht 73.0 in | Wt 193.8 lb

## 2014-02-04 DIAGNOSIS — Z5181 Encounter for therapeutic drug level monitoring: Secondary | ICD-10-CM

## 2014-02-04 DIAGNOSIS — I209 Angina pectoris, unspecified: Secondary | ICD-10-CM

## 2014-02-04 DIAGNOSIS — Z7901 Long term (current) use of anticoagulants: Secondary | ICD-10-CM

## 2014-02-04 DIAGNOSIS — I739 Peripheral vascular disease, unspecified: Secondary | ICD-10-CM

## 2014-02-04 LAB — POCT INR: INR: 2.3

## 2014-02-04 NOTE — Progress Notes (Signed)
HPI:  53 year old gentleman presenting for followup evaluation. He has lower extremity occlusive arterial disease and is maintained on chronic warfarin. He underwent embolectomy of the left femoral and popliteal arteries in 2008. He's also had a renal infarction. He has a underlying hypercoagulable state and is treated with long-term warfarin. He's undergone cardiac catheterization in 2012 for chronic chest pain and was found to have widely patent coronary arteries.  The patient is undergoing GI evaluation for chronic vomiting. An upper endoscopy as planned. He describes episodes of gagging and vomiting every morning. He denies hematemesis. He has tolerated chronic anticoagulation with warfarin without significant bleeding problems. He's had no recent chest pain, heart palpitations, or dyspnea. He continues to smoke cigarettes. He drinks alcohol fairly heavily. He seems to minimize the amount he is drinking, but appears to be at least 40 ounces of beer daily.   Outpatient Encounter Prescriptions as of 02/04/2014  Medication Sig  . esomeprazole (NEXIUM) 40 MG capsule Take 1 capsule (40 mg total) by mouth every evening.  . warfarin (COUMADIN) 10 MG tablet Take 5-10 mg by mouth daily. Take 5mg  on Wednesday and Saturday. All other days take 10mg .  . [DISCONTINUED] oxyCODONE-acetaminophen (PERCOCET) 10-325 MG per tablet Take 1 tablet by mouth 2 (two) times daily as needed for pain.  . [DISCONTINUED] Tetrahydrozoline HCl (VISINE OP) Place 1 drop into both eyes daily as needed (dry eyes).    Allergies  Allergen Reactions  . Pravastatin Itching  . Simvastatin Itching    Past Medical History  Diagnosis Date  . PAD (peripheral artery disease)     a. left leg ischemia tx wtih embolectomy of left fem, pop, tib arteries with Dr. Scot Dock - 08/2006;  b. known occlusion of right pop with collats - med Rx;  c.ABI's 8/09: R 1.0; L 0.91   . Renal infarct     R  . History of DVT of lower extremity     on  chronic coumadin  . Chest pain     stres test neg x 2, cath 5/12; minimal LAD irregs; no obs CAD; normal LVF  . Hypertension   . Internal hemorrhoids     Cscope 2007  . Polysubstance abuse     ROS: Negative except as per HPI  BP 129/87  Pulse 84  Ht 6\' 1"  (1.854 m)  Wt 87.907 kg (193 lb 12.8 oz)  BMI 25.57 kg/m2  PHYSICAL EXAM: Pt is alert and oriented, NAD HEENT: normal Neck: JVP - normal, carotids 2+= without bruits Lungs: CTA bilaterally CV: RRR without murmur or gallop Abd: soft, NT, Positive BS, no hepatomegaly Ext: no C/C/E Skin: warm/dry no rash  Myocardial perfusion study 01/16/2014: Impression  Exercise Capacity: Lexiscan with no exercise.  BP Response: Normal blood pressure response.  Clinical Symptoms: There is dyspnea.  ECG Impression: No significant ST segment change suggestive of ischemia.  Comparison with Prior Nuclear Study: No images to compare  Overall Impression: Low risk stress nuclear study Thinning of the inferior wall not thought to be significant But small area of apical ischemia with SDS 6.  LV Ejection Fraction: 51%. LV Wall Motion: Low normal EF No discrete RWMA   ASSESSMENT AND PLAN: 1. Lower extremity peripheral arterial disease. We have again reviewed the importance of tobacco cessation. He should remain on long-term warfarin. He can hold warfarin for 5 days prior to his upcoming EGD. He should resume warfarin as soon as possible after endoscopy. I will see him back in one year  for followup evaluation.  2. Chronic chest pain. He had a Myoview scan recently that demonstrated no ischemia. He's had an essentially normal cardiac catheterization a few years back. His chest pain has now resolved and I suspect is noncardiac.  [name] 02/04/2014 3:30 PM

## 2014-02-04 NOTE — Patient Instructions (Signed)
Per Dr Tresa Garter patient can hold coumadin 5 days prior to EGD.  Your physician recommends that you continue on your current medications as directed. Please refer to the Current Medication list given to you today.  Your physician wants you to follow-up in: 1 YEAR with Dr Burt Knack.  You will receive a reminder letter in the mail two months in advance. If you don't receive a letter, please call our office to schedule the follow-up appointment.

## 2014-02-11 ENCOUNTER — Ambulatory Visit (AMBULATORY_SURGERY_CENTER): Payer: Managed Care, Other (non HMO) | Admitting: Gastroenterology

## 2014-02-11 ENCOUNTER — Encounter: Payer: Self-pay | Admitting: Gastroenterology

## 2014-02-11 VITALS — BP 132/95 | HR 65 | Temp 97.8°F | Resp 21 | Ht 71.0 in | Wt 192.0 lb

## 2014-02-11 DIAGNOSIS — K219 Gastro-esophageal reflux disease without esophagitis: Secondary | ICD-10-CM

## 2014-02-11 DIAGNOSIS — K2990 Gastroduodenitis, unspecified, without bleeding: Secondary | ICD-10-CM

## 2014-02-11 DIAGNOSIS — R1115 Cyclical vomiting syndrome unrelated to migraine: Secondary | ICD-10-CM

## 2014-02-11 DIAGNOSIS — R1110 Vomiting, unspecified: Secondary | ICD-10-CM

## 2014-02-11 DIAGNOSIS — K2970 Gastritis, unspecified, without bleeding: Secondary | ICD-10-CM

## 2014-02-11 DIAGNOSIS — D131 Benign neoplasm of stomach: Secondary | ICD-10-CM

## 2014-02-11 MED ORDER — SODIUM CHLORIDE 0.9 % IV SOLN: 500.0000 mL | INTRAVENOUS | Status: DC

## 2014-02-11 NOTE — Patient Instructions (Addendum)

## 2014-02-11 NOTE — Progress Notes (Signed)
Called to room to assist during endoscopic procedure.  Patient ID and intended procedure confirmed with present staff. Received instructions for my participation in the procedure from the performing physician.  

## 2014-02-11 NOTE — Progress Notes (Signed)
Patient awake and alert, vss, report given to rn

## 2014-02-11 NOTE — Op Note (Signed)
[place]  [address] [place], [address]   ENDOSCOPY PROCEDURE REPORT  PATIENT: Anthony, Anthony  MR#: [id number] BIRTHDATE: June 18, 1961 , 52  yrs. old GENDER: Male ENDOSCOPIST: Ladene Artist, MD, [facility] PROCEDURE DATE:  02/11/2014 PROCEDURE:  EGD w/ biopsy ASA CLASS:     Class III INDICATIONS:  History of esophageal reflux.   Nausea.   Vomiting. MEDICATIONS: MAC sedation, administered by CRNA and propofol (Diprivan) 250mg  IV TOPICAL ANESTHETIC: none DESCRIPTION OF PROCEDURE: After the risks benefits and alternatives of the procedure were thoroughly explained, informed consent was obtained.  The LB [id number]-[id number] and LB [id number] endoscope was introduced through the mouth and advanced to the second portion of the duodenum. Without limitations.  The instrument was slowly withdrawn as the mucosa was fully examined.  ESOPHAGUS: The mucosa of the esophagus appeared normal. STOMACH: Moderate erosive gastritis  was found in the gastric antrum and gastric body.  Multiple biopsies were performed using cold forceps.   The stomach otherwise appeared normal. DUODENUM: Moderate duodenitis was found in the bulb The duodenal mucosa showed no abnormalities in the 2nd part of the duodenum. Retroflexed views revealed a small hiatal hernia.  The scope was then withdrawn from the patient and the procedure completed.  COMPLICATIONS: There were no complications.  ENDOSCOPIC IMPRESSION: 1.   Gastritis in the gastric antrum and gastric body; multiple biopsies 2.   Duodenal inflammation in the bulb 3.   Small hiatal hernia  RECOMMENDATIONS: 1.  Anti-reflux regimen long term 2.  Await pathology results 3.  Continue PPI daily long term 4.  See 12/2013 office note for additional recommendations  [R eSigned:  Ladene Artist, MD, [facility] 02/11/2014 9:58 AM

## 2014-02-12 ENCOUNTER — Telehealth: Payer: Self-pay | Admitting: *Deleted

## 2014-02-12 NOTE — Telephone Encounter (Signed)
  Follow up Call-  Call back number 02/11/2014  Post procedure Call Back phone  # [phone]  Permission to leave phone message Yes     No answer, left message.

## 2014-02-17 ENCOUNTER — Encounter: Payer: Self-pay | Admitting: Gastroenterology

## 2014-04-18 ENCOUNTER — Ambulatory Visit (INDEPENDENT_AMBULATORY_CARE_PROVIDER_SITE_OTHER): Payer: Managed Care, Other (non HMO) | Admitting: *Deleted

## 2014-04-18 DIAGNOSIS — Z5181 Encounter for therapeutic drug level monitoring: Secondary | ICD-10-CM

## 2014-04-18 DIAGNOSIS — Z7901 Long term (current) use of anticoagulants: Secondary | ICD-10-CM

## 2014-04-18 LAB — POCT INR: INR: 1.8

## 2014-04-18 MED ORDER — WARFARIN SODIUM 10 MG PO TABS: ORAL_TABLET | ORAL | Status: DC

## 2014-05-08 ENCOUNTER — Ambulatory Visit (INDEPENDENT_AMBULATORY_CARE_PROVIDER_SITE_OTHER): Payer: Managed Care, Other (non HMO) | Admitting: *Deleted

## 2014-05-08 DIAGNOSIS — Z7901 Long term (current) use of anticoagulants: Secondary | ICD-10-CM

## 2014-05-08 DIAGNOSIS — Z5181 Encounter for therapeutic drug level monitoring: Secondary | ICD-10-CM

## 2014-05-08 DIAGNOSIS — I82402 Acute embolism and thrombosis of unspecified deep veins of left lower extremity: Secondary | ICD-10-CM

## 2014-05-08 LAB — POCT INR: INR: 1.5

## 2014-05-22 ENCOUNTER — Ambulatory Visit (INDEPENDENT_AMBULATORY_CARE_PROVIDER_SITE_OTHER): Payer: Managed Care, Other (non HMO) | Admitting: *Deleted

## 2014-05-22 DIAGNOSIS — I82402 Acute embolism and thrombosis of unspecified deep veins of left lower extremity: Secondary | ICD-10-CM

## 2014-05-22 DIAGNOSIS — Z5181 Encounter for therapeutic drug level monitoring: Secondary | ICD-10-CM

## 2014-05-22 DIAGNOSIS — Z7901 Long term (current) use of anticoagulants: Secondary | ICD-10-CM

## 2014-05-22 LAB — POCT INR: INR: 1.3

## 2014-05-22 MED ORDER — WARFARIN SODIUM 10 MG PO TABS: ORAL_TABLET | ORAL | Status: DC

## 2014-07-02 ENCOUNTER — Ambulatory Visit (INDEPENDENT_AMBULATORY_CARE_PROVIDER_SITE_OTHER): Payer: Managed Care, Other (non HMO) | Admitting: *Deleted

## 2014-07-02 DIAGNOSIS — Z5181 Encounter for therapeutic drug level monitoring: Secondary | ICD-10-CM

## 2014-07-02 DIAGNOSIS — I82402 Acute embolism and thrombosis of unspecified deep veins of left lower extremity: Secondary | ICD-10-CM

## 2014-07-02 DIAGNOSIS — Z7901 Long term (current) use of anticoagulants: Secondary | ICD-10-CM

## 2014-07-02 LAB — POCT INR: INR: 1.1

## 2014-07-11 ENCOUNTER — Ambulatory Visit (INDEPENDENT_AMBULATORY_CARE_PROVIDER_SITE_OTHER): Payer: Managed Care, Other (non HMO) | Admitting: *Deleted

## 2014-07-11 DIAGNOSIS — Z5181 Encounter for therapeutic drug level monitoring: Secondary | ICD-10-CM

## 2014-07-11 DIAGNOSIS — Z7901 Long term (current) use of anticoagulants: Secondary | ICD-10-CM

## 2014-07-11 LAB — POCT INR: INR: 2.1

## 2014-07-29 ENCOUNTER — Ambulatory Visit (INDEPENDENT_AMBULATORY_CARE_PROVIDER_SITE_OTHER): Payer: Managed Care, Other (non HMO) | Admitting: Pharmacist

## 2014-07-29 DIAGNOSIS — Z7901 Long term (current) use of anticoagulants: Secondary | ICD-10-CM

## 2014-07-29 DIAGNOSIS — I82409 Acute embolism and thrombosis of unspecified deep veins of unspecified lower extremity: Secondary | ICD-10-CM

## 2014-07-29 DIAGNOSIS — Z5181 Encounter for therapeutic drug level monitoring: Secondary | ICD-10-CM

## 2014-07-29 LAB — POCT INR: INR: 1.4

## 2014-07-29 MED ORDER — WARFARIN SODIUM 10 MG PO TABS: ORAL_TABLET | ORAL | Status: DC

## 2014-09-16 ENCOUNTER — Ambulatory Visit (INDEPENDENT_AMBULATORY_CARE_PROVIDER_SITE_OTHER): Payer: Self-pay | Admitting: Medical

## 2014-09-16 ENCOUNTER — Ambulatory Visit (INDEPENDENT_AMBULATORY_CARE_PROVIDER_SITE_OTHER): Payer: Self-pay | Admitting: *Deleted

## 2014-09-16 ENCOUNTER — Encounter: Payer: Self-pay | Admitting: Medical

## 2014-09-16 VITALS — BP 135/90 | HR 84 | Temp 98.5°F | Ht 73.0 in | Wt 197.2 lb

## 2014-09-16 DIAGNOSIS — I10 Essential (primary) hypertension: Secondary | ICD-10-CM

## 2014-09-16 DIAGNOSIS — Z7901 Long term (current) use of anticoagulants: Secondary | ICD-10-CM

## 2014-09-16 DIAGNOSIS — I82409 Acute embolism and thrombosis of unspecified deep veins of unspecified lower extremity: Secondary | ICD-10-CM

## 2014-09-16 DIAGNOSIS — Z5181 Encounter for therapeutic drug level monitoring: Secondary | ICD-10-CM

## 2014-09-16 LAB — CBC WITH DIFFERENTIAL/PLATELET
BASOS ABS: 0 10*3/uL (ref 0.0–0.1)
Basophils Relative: 0.3 % (ref 0.0–3.0)
Eosinophils Absolute: 0.1 10*3/uL (ref 0.0–0.7)
Eosinophils Relative: 1.5 % (ref 0.0–5.0)
HCT: 47 % (ref 39.0–52.0)
Hemoglobin: 16.1 g/dL (ref 13.0–17.0)
LYMPHS ABS: 1.8 10*3/uL (ref 0.7–4.0)
Lymphocytes Relative: 33.4 % (ref 12.0–46.0)
MCHC: 34.3 g/dL (ref 30.0–36.0)
MCV: 91.9 fl (ref 78.0–100.0)
MONOS PCT: 6.4 % (ref 3.0–12.0)
Monocytes Absolute: 0.4 10*3/uL (ref 0.1–1.0)
Neutro Abs: 3.2 10*3/uL (ref 1.4–7.7)
Neutrophils Relative %: 58.4 % (ref 43.0–77.0)
PLATELETS: 271 10*3/uL (ref 150.0–400.0)
RBC: 5.11 Mil/uL (ref 4.22–5.81)
RDW: 14.9 % (ref 11.5–15.5)
WBC: 5.5 10*3/uL (ref 4.0–10.5)

## 2014-09-16 LAB — COMPREHENSIVE METABOLIC PANEL
ALT: 26 U/L (ref 0–53)
AST: 27 U/L (ref 0–37)
Albumin: 4.7 g/dL (ref 3.5–5.2)
Alkaline Phosphatase: 78 U/L (ref 39–117)
BUN: 13 mg/dL (ref 6–23)
CALCIUM: 10 mg/dL (ref 8.4–10.5)
CHLORIDE: 103 meq/L (ref 96–112)
CO2: 28 mEq/L (ref 19–32)
Creatinine, Ser: 1.08 mg/dL (ref 0.40–1.50)
GFR: 92.49 mL/min (ref >60.00)
GLUCOSE: 81 mg/dL (ref 70–99)
Potassium: 4.2 mEq/L (ref 3.5–5.1)
SODIUM: 138 meq/L (ref 135–145)
TOTAL PROTEIN: 7.8 g/dL (ref 6.0–8.3)
Total Bilirubin: 0.3 mg/dL (ref 0.2–1.2)

## 2014-09-16 LAB — POCT INR: INR: 2.3

## 2014-09-16 MED ORDER — WARFARIN SODIUM 10 MG PO TABS: ORAL_TABLET | ORAL | Status: DC

## 2014-09-16 MED ORDER — LISINOPRIL 10 MG PO TABS: 10.0000 mg | ORAL_TABLET | Freq: Every day | ORAL | Status: DC

## 2014-09-16 NOTE — Patient Instructions (Addendum)
HTN (hypertension) I want you to decrease smoking. No caffeine intake. Follow dash diet. Start lisinopril and get cmp today. Check your bp every other day at home and docment readings. Follow up in 10 days or as needed.     DASH Eating Plan DASH stands for "Dietary Approaches to Stop Hypertension." The DASH eating plan is a healthy eating plan that has been shown to reduce high blood pressure (hypertension). Additional health benefits may include reducing the risk of type 2 diabetes mellitus, heart disease, and stroke. The DASH eating plan may also help with weight loss. WHAT DO I NEED TO KNOW ABOUT THE DASH EATING PLAN? For the DASH eating plan, you will follow these general guidelines:  Choose foods with a percent daily value for sodium of less than 5% (as listed on the food label).  Use salt-free seasonings or herbs instead of table salt or sea salt.  Check with your health care provider or pharmacist before using salt substitutes.  Eat lower-sodium products, often labeled as "lower sodium" or "no salt added."  Eat fresh foods.  Eat more vegetables, fruits, and low-fat dairy products.  Choose whole grains. Look for the word "whole" as the first word in the ingredient list.  Choose fish and skinless chicken or [place] more often than red meat. Limit fish, poultry, and meat to 6 oz (170 g) each day.  Limit sweets, desserts, sugars, and sugary drinks.  Choose heart-healthy fats.  Limit cheese to 1 oz (28 g) per day.  Eat more home-cooked food and less restaurant, buffet, and fast food.  Limit fried foods.  Cook foods using methods other than frying.  Limit canned vegetables. If you do use them, rinse them well to decrease the sodium.  When eating at a restaurant, ask that your food be prepared with less salt, or no salt if possible. WHAT FOODS CAN I EAT? Seek help from a dietitian for individual calorie needs. Grains Whole grain or whole wheat bread. Brown rice. Whole  grain or whole wheat pasta. Quinoa, bulgur, and whole grain cereals. Low-sodium cereals. Corn or whole wheat flour tortillas. Whole grain cornbread. Whole grain crackers. Low-sodium crackers. Vegetables Fresh or frozen vegetables (raw, steamed, roasted, or grilled). Low-sodium or reduced-sodium tomato and vegetable juices. Low-sodium or reduced-sodium tomato sauce and paste. Low-sodium or reduced-sodium canned vegetables.  Fruits All fresh, canned (in natural juice), or frozen fruits. Meat and Other Protein Products Ground beef (85% or leaner), grass-fed beef, or beef trimmed of fat. Skinless chicken or [place]. Ground chicken or [place]. Pork trimmed of fat. All fish and seafood. Eggs. Dried beans, peas, or lentils. Unsalted nuts and seeds. Unsalted canned beans. Dairy Low-fat dairy products, such as skim or 1% milk, 2% or reduced-fat cheeses, low-fat ricotta or cottage cheese, or plain low-fat yogurt. Low-sodium or reduced-sodium cheeses. Fats and Oils Tub margarines without trans fats. Light or reduced-fat mayonnaise and salad dressings (reduced sodium). Avocado. Safflower, olive, or canola oils. Natural peanut or almond butter. Other Unsalted popcorn and pretzels. The items listed above may not be a complete list of recommended foods or beverages. Contact your dietitian for more options. WHAT FOODS ARE NOT RECOMMENDED? Grains White bread. White pasta. White rice. Refined cornbread. Bagels and croissants. Crackers that contain trans fat. Vegetables Creamed or fried vegetables. Vegetables in a cheese sauce. Regular canned vegetables. Regular canned tomato sauce and paste. Regular tomato and vegetable juices. Fruits Dried fruits. Canned fruit in light or heavy syrup. Fruit juice. Meat and Other Protein Products  Fatty cuts of meat. Ribs, chicken wings, bacon, sausage, bologna, salami, chitterlings, fatback, hot dogs, bratwurst, and packaged luncheon meats. Salted nuts and seeds. Canned beans with  salt. Dairy Whole or 2% milk, cream, half-and-half, and cream cheese. Whole-fat or sweetened yogurt. Full-fat cheeses or blue cheese. Nondairy creamers and whipped toppings. Processed cheese, cheese spreads, or cheese curds. Condiments Onion and garlic salt, seasoned salt, table salt, and sea salt. Canned and packaged gravies. Worcestershire sauce. Tartar sauce. Barbecue sauce. Teriyaki sauce. Soy sauce, including reduced sodium. Steak sauce. Fish sauce. Oyster sauce. Cocktail sauce. Horseradish. Ketchup and mustard. Meat flavorings and tenderizers. Bouillon cubes. Hot sauce. Tabasco sauce. Marinades. Taco seasonings. Relishes. Fats and Oils Butter, stick margarine, lard, shortening, ghee, and bacon fat. Coconut, palm kernel, or palm oils. Regular salad dressings. Other Pickles and olives. Salted popcorn and pretzels. The items listed above may not be a complete list of foods and beverages to avoid. Contact your dietitian for more information. WHERE CAN I FIND MORE INFORMATION? National Heart, Lung, and Blood Institute: travelstabloid.com Document Released: 07/14/2011 Document Revised: 12/09/2013 Document Reviewed: 05/29/2013 [facility] Patient Information 2015 [place], [place]. This information is not intended to replace advice given to you by your health care provider. Make sure you discuss any questions you have with your health care provider.

## 2014-09-16 NOTE — Assessment & Plan Note (Signed)
I want you to decrease smoking. No caffeine intake. Follow dash diet. Start lisinopril and get cmp today. Check your bp every other day at home and docment readings. Follow up in 10 days or as needed.

## 2014-09-16 NOTE — Progress Notes (Signed)
Pre visit review using our clinic review tool, if applicable. No additional management support is needed unless otherwise documented below in the visit note. 

## 2014-09-16 NOTE — Progress Notes (Signed)
Subjective:    Patient ID: [name], male    DOB: [birth year], 54 y.o.   MRN: [id number]  HPI    Pt last 3 bp here were close to borderline.Pt states 143/112 at coumadin clinic today. 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 So he was sent home and told to get evaluated. Pt also is a smoker. No cardiac or neurologic signs or symptoms.   Pt does not drink soda or coffee. He does smoke.   Review of Systems  Constitutional: Negative for fever, chills, diaphoresis, activity change and fatigue.  Respiratory: Negative for cough, chest tightness, shortness of breath and wheezing.   Cardiovascular: Negative for chest pain, palpitations and leg swelling.  Gastrointestinal: Negative for nausea, vomiting and abdominal pain.  Musculoskeletal: Negative for neck pain and neck stiffness.  Neurological: Negative for dizziness, tremors, seizures, syncope, facial asymmetry, speech difficulty, weakness, light-headedness, numbness and headaches.  Psychiatric/Behavioral: Negative for behavioral problems, confusion and agitation. The patient is not nervous/anxious.     Past Medical History  Diagnosis Date  . PAD (peripheral artery disease)     a. left leg ischemia tx wtih embolectomy of left fem, pop, tib arteries with Dr. Scot Dock - 08/2006;  b. known occlusion of right pop with collats - med Rx;  c.ABI's 8/09: R 1.0; L 0.91   . Renal infarct     R  . History of DVT of lower extremity     on chronic coumadin  . Chest pain     stres test neg x 2, cath 5/12; minimal LAD irregs; no obs CAD; normal LVF  . Internal hemorrhoids     Cscope 2007  . Polysubstance abuse     History   Social History  . Marital Status: Married    Spouse Name: N/A    Number of Children: 2  . Years of Education: N/A   Occupational History  . [employer]    Social History Main Topics  . Smoking status: Current Every Day Smoker -- 1.00 packs/day for 30  years    Types: Cigarettes  . Smokeless tobacco: Never Used  . Alcohol Use: 15.0 oz/week    25 Cans of beer per week     Comment: +++ abuse  . Drug Use: Yes    Special: Marijuana     Comment: h/o abuse, denies at this time 11-2013, states "from time to time"  . Sexual Activity: Not on file   Other Topics Concern  . Not on file   Social History Narrative    Past Surgical History  Procedure Laterality Date  . Cholecystectomy    . Left leg blood clot removal 2009  2009  . Tonsillectomy    . Wrist surgery      Family History  Problem Relation Age of Onset  . Hypertension Mother   . Hypertension Father   . Lung cancer Maternal Uncle   . Pancreatic cancer Brother     Allergies  Allergen Reactions  . Pravastatin Itching  . Simvastatin Itching    Current Outpatient Prescriptions on File Prior to Visit  Medication Sig Dispense Refill  . esomeprazole (NEXIUM) 40 MG capsule Take 1 capsule (40 mg total) by mouth every evening. 30 capsule 11  . warfarin (COUMADIN) 10 MG tablet Take as directed by coumadin clinic 30 tablet 2   No current facility-administered medications on file prior to visit.    BP 135/90  mmHg  Pulse 84  Temp(Src) 98.5 F (36.9 C) (Oral)  Ht 6\' 1"  (1.854 m)  Wt 197 lb 3.2 oz (89.449 kg)  BMI 26.02 kg/m2  SpO2 98%      Objective:   Physical Exam    General Mental Status- Alert. General Appearance- Not in acute distress.   Skin General: Color- Normal Color. Moisture- Normal Moisture.  Neck Carotid Arteries- Normal color. Moisture- Normal Moisture. No carotid bruits. No JVD.  Chest and Lung Exam Auscultation: Breath Sounds:-Normal.  Cardiovascular Auscultation:Rythm- Regular. Murmurs & Other Heart Sounds:Auscultation of the heart reveals- No Murmurs.  Abdomen Inspection:-Inspeection Normal. Palpation/Percussion:Note:No mass. Palpation and Percussion of the abdomen reveal- Non Tender, Non Distended + BS, no rebound or  guarding.    Neurologic Cranial Nerve exam:- CN III-XII intact(No nystagmus), symmetric smile. Drift Test:- No drift. Romberg Exam:- Negative.  Heal to Toe Gait exam:-Normal. Finger to Nose:- Normal/Intact Strength:- 5/5 equal and symmetric strength both upper and lower extremities.        Assessment & Plan:

## 2014-09-18 ENCOUNTER — Telehealth: Payer: Self-pay | Admitting: *Deleted

## 2014-09-18 NOTE — Telephone Encounter (Signed)
Received call from [facility] pharmacist  at  [place]  that they are Oceanographer of coumadin to [facility]. Also pt is getting his medications now at the [facility]  on [address]

## 2014-09-30 ENCOUNTER — Ambulatory Visit: Payer: Self-pay | Admitting: Internal Medicine

## 2014-09-30 ENCOUNTER — Encounter: Payer: Self-pay | Admitting: Medical

## 2014-09-30 ENCOUNTER — Ambulatory Visit (INDEPENDENT_AMBULATORY_CARE_PROVIDER_SITE_OTHER): Payer: Self-pay | Admitting: Medical

## 2014-09-30 VITALS — BP 128/85 | HR 87 | Temp 98.7°F | Ht 73.0 in | Wt 201.0 lb

## 2014-09-30 DIAGNOSIS — I10 Essential (primary) hypertension: Secondary | ICD-10-CM

## 2014-09-30 MED ORDER — LISINOPRIL 10 MG PO TABS
10.0000 mg | ORAL_TABLET | Freq: Every day | ORAL | Status: DC
Start: 2014-09-30 — End: 2015-03-05

## 2014-09-30 NOTE — Progress Notes (Signed)
Subjective:    Patient ID: [name], male    DOB: June 15, 1961, 54 y.o.   MRN: [id number]  HPI   Pt in for bp check. He is now on lisinopril. No side effects. No chest pain. No neurologic signs or symptoms.   No  allergic reaction.    Review of Systems  Constitutional: Negative for fever, chills, diaphoresis, activity change and fatigue.  Respiratory: Negative for cough, chest tightness and shortness of breath.   Cardiovascular: Negative for chest pain, palpitations and leg swelling.  Gastrointestinal: Negative for nausea, vomiting and abdominal pain.  Musculoskeletal: Negative for neck pain and neck stiffness.  Neurological: Negative for dizziness, tremors, seizures, syncope, facial asymmetry, speech difficulty, weakness, light-headedness, numbness and headaches.  Psychiatric/Behavioral: Negative for behavioral problems, confusion and agitation. The patient is not nervous/anxious.     Past Medical History  Diagnosis Date  . PAD (peripheral artery disease)     a. left leg ischemia tx wtih embolectomy of left fem, pop, tib arteries with Dr. Scot Dock - 08/2006;  b. known occlusion of right pop with collats - med Rx;  c.ABI's 8/09: R 1.0; L 0.91   . Renal infarct     R  . History of DVT of lower extremity     on chronic coumadin  . Chest pain     stres test neg x 2, cath 5/12; minimal LAD irregs; no obs CAD; normal LVF  . Internal hemorrhoids     Cscope 2007  . Polysubstance abuse     History   Social History  . Marital Status: Married    Spouse Name: N/A  . Number of Children: 2  . Years of Education: N/A   Occupational History  . [employer]    Social History Main Topics  . Smoking status: Current Every Day Smoker -- 1.00 packs/day for 30 years    Types: Cigarettes  . Smokeless tobacco: Never Used  . Alcohol Use: 15.0 oz/week    25 Cans of beer per week     Comment: +++ abuse  . Drug Use: Yes    Special: Marijuana     Comment: h/o abuse,  denies at this time 11-2013, states "from time to time"  . Sexual Activity: Not on file   Other Topics Concern  . Not on file   Social History Narrative    Past Surgical History  Procedure Laterality Date  . Cholecystectomy    . Left leg blood clot removal 2009  2009  . Tonsillectomy    . Wrist surgery      Family History  Problem Relation Age of Onset  . Hypertension Mother   . Hypertension Father   . Lung cancer Maternal Uncle   . Pancreatic cancer Brother     Allergies  Allergen Reactions  . Pravastatin Itching  . Simvastatin Itching    Current Outpatient Prescriptions on File Prior to Visit  Medication Sig Dispense Refill  . esomeprazole (NEXIUM) 40 MG capsule Take 1 capsule (40 mg total) by mouth every evening. 30 capsule 11  . lisinopril (PRINIVIL,ZESTRIL) 10 MG tablet Take 1 tablet (10 mg total) by mouth daily. 30 tablet 3  . warfarin (COUMADIN) 10 MG tablet Take as directed by coumadin clinic 30 tablet 2   No current facility-administered medications on file prior to visit.    BP 134/93 mmHg  Pulse 87  Temp(Src) 98.7 F (37.1 C) (Oral)  Ht 6\' 1"  (1.854 m)  Wt 201 lb (91.173 kg)  BMI 26.52 kg/m2  SpO2 96%       Objective:   Physical Exam  General Mental Status- Alert. General Appearance- Not in acute distress.   Skin General: Color- Normal Color. Moisture- Normal Moisture.  Neck Carotid Arteries- Normal color. Moisture- Normal Moisture. No carotid bruits. No JVD.  Chest and Lung Exam Auscultation: Breath Sounds:-Normal.  Cardiovascular Auscultation:Rythm- Regular. Murmurs & Other Heart Sounds:Auscultation of the heart reveals- No Murmurs.  Abdomen Inspection:-Inspeection Normal. Palpation/Percussion:Note:No mass. Palpation and Percussion of the abdomen reveal- Non Tender, Non Distended + BS, no rebound or guarding.    Neurologic Cranial Nerve exam:- CN III-XII intact(No nystagmus), symmetric smile. Drift Test:- No drift. Romberg  Exam:- Negative.  Heal to Toe Gait exam:-Normal. Finger to Nose:- Normal/Intact Strength:- 5/5 equal and symmetric strength both upper and lower extremities.     Assessment & Plan:

## 2014-09-30 NOTE — Patient Instructions (Signed)
HTN (hypertension) Controlled today. I will refill lisinopril. Follow up in 6 months. Come in fasting that day will check your cmp and lipid panel.   Recommend getting otc bp cuff and check bp twice a week or if feels ill.     Follow up in 6 months or as needed

## 2014-09-30 NOTE — Progress Notes (Signed)
Pre visit review using our clinic review tool, if applicable. No additional management support is needed unless otherwise documented below in the visit note. 

## 2014-09-30 NOTE — Assessment & Plan Note (Signed)
Controlled today. I will refill lisinopril. Follow up in 6 months. Come in fasting that day will check your cmp and lipid panel.   Recommend getting otc bp cuff and check bp twice a week or if feels ill.

## 2014-10-16 ENCOUNTER — Ambulatory Visit (INDEPENDENT_AMBULATORY_CARE_PROVIDER_SITE_OTHER): Payer: Self-pay | Admitting: *Deleted

## 2014-10-16 DIAGNOSIS — Z5181 Encounter for therapeutic drug level monitoring: Secondary | ICD-10-CM

## 2014-10-16 DIAGNOSIS — I82409 Acute embolism and thrombosis of unspecified deep veins of unspecified lower extremity: Secondary | ICD-10-CM

## 2014-10-16 DIAGNOSIS — Z7901 Long term (current) use of anticoagulants: Secondary | ICD-10-CM

## 2014-10-16 LAB — POCT INR: INR: 3.3

## 2014-10-16 MED ORDER — WARFARIN SODIUM 10 MG PO TABS: ORAL_TABLET | ORAL | Status: DC

## 2014-11-28 ENCOUNTER — Ambulatory Visit (INDEPENDENT_AMBULATORY_CARE_PROVIDER_SITE_OTHER): Payer: Self-pay | Admitting: *Deleted

## 2014-11-28 DIAGNOSIS — Z7901 Long term (current) use of anticoagulants: Secondary | ICD-10-CM

## 2014-11-28 DIAGNOSIS — I82409 Acute embolism and thrombosis of unspecified deep veins of unspecified lower extremity: Secondary | ICD-10-CM

## 2014-11-28 DIAGNOSIS — Z5181 Encounter for therapeutic drug level monitoring: Secondary | ICD-10-CM

## 2014-11-28 LAB — POCT INR: INR: 1.3

## 2014-12-18 ENCOUNTER — Ambulatory Visit (INDEPENDENT_AMBULATORY_CARE_PROVIDER_SITE_OTHER): Payer: Self-pay | Admitting: *Deleted

## 2014-12-18 DIAGNOSIS — Z5181 Encounter for therapeutic drug level monitoring: Secondary | ICD-10-CM

## 2014-12-18 DIAGNOSIS — Z7901 Long term (current) use of anticoagulants: Secondary | ICD-10-CM

## 2014-12-18 DIAGNOSIS — I82409 Acute embolism and thrombosis of unspecified deep veins of unspecified lower extremity: Secondary | ICD-10-CM

## 2014-12-18 LAB — POCT INR: INR: 2.7

## 2015-01-12 ENCOUNTER — Encounter: Payer: Self-pay | Admitting: Gastroenterology

## 2015-01-15 ENCOUNTER — Encounter: Payer: Self-pay | Admitting: *Deleted

## 2015-01-19 ENCOUNTER — Ambulatory Visit (INDEPENDENT_AMBULATORY_CARE_PROVIDER_SITE_OTHER): Payer: Self-pay | Admitting: Cardiovascular Disease

## 2015-01-19 ENCOUNTER — Encounter: Payer: Self-pay | Admitting: Cardiovascular Disease

## 2015-01-19 ENCOUNTER — Ambulatory Visit (INDEPENDENT_AMBULATORY_CARE_PROVIDER_SITE_OTHER): Payer: Self-pay | Admitting: *Deleted

## 2015-01-19 VITALS — BP 122/90 | HR 84 | Ht 73.0 in | Wt 200.1 lb

## 2015-01-19 DIAGNOSIS — I82409 Acute embolism and thrombosis of unspecified deep veins of unspecified lower extremity: Secondary | ICD-10-CM

## 2015-01-19 DIAGNOSIS — I10 Essential (primary) hypertension: Secondary | ICD-10-CM

## 2015-01-19 DIAGNOSIS — E785 Hyperlipidemia, unspecified: Secondary | ICD-10-CM

## 2015-01-19 DIAGNOSIS — Z7901 Long term (current) use of anticoagulants: Secondary | ICD-10-CM

## 2015-01-19 DIAGNOSIS — Z5181 Encounter for therapeutic drug level monitoring: Secondary | ICD-10-CM

## 2015-01-19 LAB — POCT INR: INR: 4.3

## 2015-01-19 NOTE — Patient Instructions (Addendum)
Medication Instructions:  Your physician recommends that you continue on your current medications as directed. Please refer to the Current Medication list given to you today.  Labwork: Your physician recommends that you return for a FASTING LIPID and LIVER--nothing to eat or drink after midnight. Will check labs with your next coumadin appointment.   Testing/Procedures: No new orders.   Follow-Up: Your physician wants you to follow-up in: 1 YEAR with Dr Burt Knack.  You will receive a reminder letter in the mail two months in advance. If you don't receive a letter, please call our office to schedule the follow-up appointment.  Any Other Special Instructions Will Be Listed Below (If Applicable).

## 2015-01-19 NOTE — Progress Notes (Signed)
Cardiology Office Note   Date:  01/19/2015   ID:  CARVER MURAKAMI, DOB 15-Sep-1960, MRN [id number]  PCP:  Kathlene November, MD  Cardiologist:  Sherren Mocha, MD    No chief complaint on file.   History of Present Illness: [name] is a 54 y.o. male who presents for follow-up evaluation. He has lower extremity occlusive arterial disease and is maintained on chronic warfarin. He underwent embolectomy of the left femoral and popliteal arteries in 2008. He's also had a renal infarction. He has a underlying hypercoagulable state and is treated with long-term warfarin. He's undergone cardiac catheterization in 2012 for chronic chest pain and was found to have widely patent coronary arteries. He's been noncompliant with recommendations for lifestyle modification and has continued to smoke cigarettes and drink alcohol over the last several years.  Still smoking at least 1 ppd and drinking alcohol. He works nights now and had a beer this am already. He has no cardiac-related complaints and specifically denies CP, pressure, shortness of breath, edema, or palpitations.    Past Medical History  Diagnosis Date  . PAD (peripheral artery disease)     a. left leg ischemia tx wtih embolectomy of left fem, pop, tib arteries with Dr. Scot Dock - 08/2006;  b. known occlusion of right pop with collats - med Rx;  c.ABI's 8/09: R 1.0; L 0.91   . Renal infarct     R  . History of DVT of lower extremity     on chronic coumadin  . Chest pain     stres test neg x 2, cath 5/12; minimal LAD irregs; no obs CAD; normal LVF  . Internal hemorrhoids     Cscope 2007  . Polysubstance abuse     Past Surgical History  Procedure Laterality Date  . Cholecystectomy    . Left leg blood clot removal 2009  2009  . Tonsillectomy    . Wrist surgery      Current Outpatient Prescriptions  Medication Sig Dispense Refill  . lisinopril (PRINIVIL,ZESTRIL) 10 MG tablet Take 1 tablet (10 mg total) by mouth daily. 30  tablet 3  . warfarin (COUMADIN) 10 MG tablet Take as directed by coumadin clinic 30 tablet 1   No current facility-administered medications for this visit.    Allergies:   Pravastatin and Simvastatin   Social History:  The patient  reports that he has been smoking Cigarettes.  He has a 30 pack-year smoking history. He has never used smokeless tobacco. He reports that he drinks about 15.0 oz of alcohol per week. He reports that he uses illicit drugs (Marijuana).   Family History:  The patient's  family history includes CAD in his father; Hypertension in his father and mother; Lung cancer in his maternal uncle; Pancreatic cancer in his brother.   ROS:  Please see the history of present illness.  Otherwise, review of systems is positive for right shoulder pain (thinks he has a torn rotator cuff).  All other systems are reviewed and negative.   PHYSICAL EXAM: VS:  BP 122/90 mmHg  Pulse 84  Ht 6\' 1"  (1.854 m)  Wt 200 lb 1.9 oz (90.774 kg)  BMI 26.41 kg/m2 , BMI Body mass index is 26.41 kg/(m^2). GEN: Well nourished, well developed, in no acute distress HEENT: normal Neck: no JVD, no masses. No carotid bruits Cardiac: RRR without murmur or gallop                Respiratory:  clear to  auscultation bilaterally, normal work of breathing GI: soft, nontender, nondistended, + BS MS: no deformity or atrophy Ext: no pretibial edema Skin: warm and dry, no rash Neuro:  Strength and sensation are intact Psych: euthymic mood, full affect  EKG:  EKG is ordered today. The ekg ordered today shows normal sinus rhythm 84 bpm, within normal limits.  Recent Labs: 09/16/2014: ALT 26; BUN 13; Creatinine, Ser 1.08; Hemoglobin 16.1; Platelets 271.0; Potassium 4.2; Sodium 138   Lipid Panel     Component Value Date/Time   CHOL 160 08/17/2011 0843   TRIG 82.0 08/17/2011 0843   HDL 49.90 08/17/2011 0843   CHOLHDL 3 08/17/2011 0843   VLDL 16.4 08/17/2011 0843   LDLCALC 94 08/17/2011 0843   LDLDIRECT 143.7  11/24/2008 1235      Wt Readings from Last 3 Encounters:  01/19/15 200 lb 1.9 oz (90.774 kg)  09/30/14 201 lb (91.173 kg)  09/16/14 197 lb 3.2 oz (89.449 kg)     Cardiac Studies Reviewed: Myoview stress scan 01/15/2014: QPS Raw Data Images: Patient motion noted. Stress Images: There is decreased uptake in the anterior wall. Rest Images: There is decreased uptake in the anterior wall. Subtraction (SDS): These findings are consistent with ischemia. Transient Ischemic Dilatation (Normal <1.22): 0.97 Lung/Heart Ratio (Normal <0.45): 0.25  Quantitative Gated Spect Images QGS EDV: 114 ml QGS ESV: 50 ml  Impression Exercise Capacity: Lexiscan with no exercise. BP Response: Normal blood pressure response. Clinical Symptoms: There is dyspnea. ECG Impression: No significant ST segment change suggestive of ischemia. Comparison with Prior Nuclear Study: No images to compare  Overall Impression: Low risk stress nuclear study Thinning of the inferior wall not thought to be significant But small area of apical ischemia with SDS 6.  LV Ejection Fraction: 51%. LV Wall Motion: Low normal EF No discrete RWMA   ASSESSMENT AND PLAN: 1.  Lower extremity peripheral arterial disease: Reports no symptoms. Specifically denies any leg claudication. He understands the negative impact of smoking. I counseled him extensively today. The patient is appropriately on chronic anticoagulation with warfarin and reports no bleeding problems.  2. Chest pain: No recent issues. Myoview from last year reviewed as outlined above.  3. Hypercoagulable disorder maintained on chronic warfarin: Tolerating anticoagulation with no further events. He is followed in the Coumadin clinic and reports no bleeding problems.  4. Hyperlipidemia: statin-intolerant/allergic (itching with multiple agents attempted in past). Update lipid panel and consider statin alternatives.  5. Tobacco and alcohol abuse: Counseled  extensively about specific health implications. He understands the need to quit.  Current medicines are reviewed with the patient today.  The patient does not have concerns regarding medicines.  Labs/ tests ordered today include:  No orders of the defined types were placed in this encounter.    Disposition:   FU one year  Signed, Sherren Mocha, MD  01/19/2015 9:43 AM    [facility] [address], [place], [place]  [address] Phone: [phone]; Fax: [phone]

## 2015-02-04 ENCOUNTER — Other Ambulatory Visit (INDEPENDENT_AMBULATORY_CARE_PROVIDER_SITE_OTHER): Payer: Self-pay | Admitting: *Deleted

## 2015-02-04 ENCOUNTER — Ambulatory Visit (INDEPENDENT_AMBULATORY_CARE_PROVIDER_SITE_OTHER): Payer: Self-pay | Admitting: *Deleted

## 2015-02-04 DIAGNOSIS — Z7901 Long term (current) use of anticoagulants: Secondary | ICD-10-CM

## 2015-02-04 DIAGNOSIS — E785 Hyperlipidemia, unspecified: Secondary | ICD-10-CM

## 2015-02-04 DIAGNOSIS — I10 Essential (primary) hypertension: Secondary | ICD-10-CM

## 2015-02-04 DIAGNOSIS — I82409 Acute embolism and thrombosis of unspecified deep veins of unspecified lower extremity: Secondary | ICD-10-CM

## 2015-02-04 DIAGNOSIS — Z5181 Encounter for therapeutic drug level monitoring: Secondary | ICD-10-CM

## 2015-02-04 LAB — LIPID PANEL
Cholesterol: 225 mg/dL — ABNORMAL HIGH (ref 0–200)
HDL: 52.1 mg/dL (ref >39.00)
LDL Cholesterol: 146 mg/dL — ABNORMAL HIGH (ref 0–99)
NONHDL: 172.9
TRIGLYCERIDES: 136 mg/dL (ref 0.0–149.0)
Total CHOL/HDL Ratio: 4
VLDL: 27.2 mg/dL (ref 0.0–40.0)

## 2015-02-04 LAB — HEPATIC FUNCTION PANEL
ALT: 28 U/L (ref 0–53)
AST: 30 U/L (ref 0–37)
Albumin: 4.2 g/dL (ref 3.5–5.2)
Alkaline Phosphatase: 71 U/L (ref 39–117)
BILIRUBIN DIRECT: 0.1 mg/dL (ref 0.0–0.3)
Total Bilirubin: 0.6 mg/dL (ref 0.2–1.2)
Total Protein: 7 g/dL (ref 6.0–8.3)

## 2015-02-04 LAB — POCT INR: INR: 1.6

## 2015-02-05 ENCOUNTER — Other Ambulatory Visit: Payer: Self-pay | Admitting: *Deleted

## 2015-02-05 DIAGNOSIS — E785 Hyperlipidemia, unspecified: Secondary | ICD-10-CM

## 2015-02-05 MED ORDER — EZETIMIBE 10 MG PO TABS: 10.0000 mg | ORAL_TABLET | Freq: Every day | ORAL | Status: DC

## 2015-02-05 MED ORDER — WARFARIN SODIUM 10 MG PO TABS: ORAL_TABLET | ORAL | Status: DC

## 2015-02-12 ENCOUNTER — Telehealth: Payer: Self-pay | Admitting: Cardiovascular Disease

## 2015-02-12 NOTE — Telephone Encounter (Signed)
Probably best to just work on lifestyle modification. thx

## 2015-02-12 NOTE — Telephone Encounter (Signed)
Pt was recently prescribed Zetia and he went to the pharmacy and this will be $85.00/month.  The pt cannot afford this medication.  The pt has a history of statin intolerance. I will forward this message to Dr Burt Knack to review and make further recommendations.

## 2015-02-12 NOTE — Telephone Encounter (Signed)
New Message  Pt calling about medication alternative (current Rx is expensive- per pt), pt stated it was the meds for his cholesterol. Please call back and discuss.

## 2015-02-12 NOTE — Telephone Encounter (Signed)
I spoke with the pt and made him aware that Dr Burt Knack recommends he focus on diet and exercise. Zetia removed from medication list.

## 2015-03-05 ENCOUNTER — Other Ambulatory Visit: Payer: Self-pay | Admitting: Cardiovascular Disease

## 2015-03-05 MED ORDER — LISINOPRIL 10 MG PO TABS: 10.0000 mg | ORAL_TABLET | Freq: Every day | ORAL | Status: DC

## 2015-04-27 ENCOUNTER — Other Ambulatory Visit: Payer: Self-pay

## 2015-04-27 ENCOUNTER — Ambulatory Visit: Payer: Self-pay | Admitting: Cardiovascular Disease

## 2015-04-30 ENCOUNTER — Ambulatory Visit (INDEPENDENT_AMBULATORY_CARE_PROVIDER_SITE_OTHER): Payer: BLUE CROSS/BLUE SHIELD | Admitting: *Deleted

## 2015-04-30 ENCOUNTER — Other Ambulatory Visit (INDEPENDENT_AMBULATORY_CARE_PROVIDER_SITE_OTHER): Payer: BLUE CROSS/BLUE SHIELD | Admitting: *Deleted

## 2015-04-30 DIAGNOSIS — Z5181 Encounter for therapeutic drug level monitoring: Secondary | ICD-10-CM

## 2015-04-30 DIAGNOSIS — E785 Hyperlipidemia, unspecified: Secondary | ICD-10-CM | POA: Diagnosis not present

## 2015-04-30 DIAGNOSIS — I82409 Acute embolism and thrombosis of unspecified deep veins of unspecified lower extremity: Secondary | ICD-10-CM

## 2015-04-30 DIAGNOSIS — Z7901 Long term (current) use of anticoagulants: Secondary | ICD-10-CM | POA: Diagnosis not present

## 2015-04-30 LAB — LIPID PANEL
CHOLESTEROL: 216 mg/dL — AB (ref 0–200)
HDL: 51.8 mg/dL (ref >39.00)
LDL Cholesterol: 141 mg/dL — ABNORMAL HIGH (ref 0–99)
NonHDL: 164.26
TRIGLYCERIDES: 116 mg/dL (ref 0.0–149.0)
Total CHOL/HDL Ratio: 4
VLDL: 23.2 mg/dL (ref 0.0–40.0)

## 2015-04-30 LAB — POCT INR: INR: 1.4

## 2015-05-07 ENCOUNTER — Emergency Department (HOSPITAL_COMMUNITY)
Admission: EM | Admit: 2015-05-07 | Discharge: 2015-05-07 | Disposition: A | Discharge status: Home / Self Care | Payer: BLUE CROSS/BLUE SHIELD | Source: Home / Self Care | Attending: Emergency Medicine | Admitting: Emergency Medicine

## 2015-05-07 ENCOUNTER — Telehealth: Payer: Self-pay | Admitting: Internal Medicine

## 2015-05-07 ENCOUNTER — Encounter (HOSPITAL_COMMUNITY): Payer: Self-pay | Admitting: Internal Medicine

## 2015-05-07 ENCOUNTER — Ambulatory Visit (HOSPITAL_COMMUNITY)
Admission: RE | Admit: 2015-05-07 | Discharge: 2015-05-07 | Disposition: A | Discharge status: Home / Self Care | Payer: BLUE CROSS/BLUE SHIELD | Source: Ambulatory Visit | Attending: Emergency Medicine | Admitting: Emergency Medicine

## 2015-05-07 ENCOUNTER — Encounter (HOSPITAL_COMMUNITY): Payer: Self-pay | Admitting: Emergency Medicine

## 2015-05-07 DIAGNOSIS — Z7901 Long term (current) use of anticoagulants: Secondary | ICD-10-CM | POA: Insufficient documentation

## 2015-05-07 DIAGNOSIS — M79604 Pain in right leg: Secondary | ICD-10-CM

## 2015-05-07 DIAGNOSIS — Z86718 Personal history of other venous thrombosis and embolism: Secondary | ICD-10-CM

## 2015-05-07 DIAGNOSIS — M79609 Pain in unspecified limb: Secondary | ICD-10-CM | POA: Diagnosis not present

## 2015-05-07 DIAGNOSIS — I739 Peripheral vascular disease, unspecified: Secondary | ICD-10-CM | POA: Insufficient documentation

## 2015-05-07 LAB — PROTIME-INR
INR: 1.43 (ref 0.00–1.49)
Prothrombin Time: 17.6 seconds — ABNORMAL HIGH (ref 11.6–15.2)

## 2015-05-07 MED ORDER — OXYCODONE-ACETAMINOPHEN 5-325 MG PO TABS
1.0000 | ORAL_TABLET | Freq: Once | ORAL | Status: AC
  Administered 2015-05-07: 1 via ORAL
  Filled 2015-05-07: qty 1

## 2015-05-07 MED ORDER — OXYCODONE-ACETAMINOPHEN 5-325 MG PO TABS: 1.0000 | ORAL_TABLET | Freq: Four times a day (QID) | ORAL | Status: DC | PRN

## 2015-05-07 NOTE — ED Provider Notes (Addendum)
CSN: [id number]     Arrival date & time 05/07/15  9476 History  By signing my name below, I, Hansel Feinstein, attest that this documentation has been prepared under the direction and in the presence of Merryl Hacker, MD. Electronically Signed: Hansel Feinstein, ED Scribe. 05/07/2015. 3:56 AM.    Chief Complaint  Patient presents with  . Leg Pain   The history is provided by the patient. No language interpreter was used.    HPI Comments: [name] is a 54 y.o. male with hx of PAD, DVT in the left calf, polysubstance abuse who presents to the Emergency Department complaining of moderate, gradually worsening right medial thigh pain onset this morning with associated HA. No known injury. Pt is on warfarin and has a hx of missing doses. His last PT/INR was abnormal. 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 Pt is a current smoker. He denies swelling, SOB, CP, palpitations, difficulty breathing, calf pain, scrotal pain.   Past Medical History  Diagnosis Date  . PAD (peripheral artery disease)     a. left leg ischemia tx wtih embolectomy of left fem, pop, tib arteries with Dr. Scot Dock - 08/2006;  b. known occlusion of right pop with collats - med Rx;  c.ABI's 8/09: R 1.0; L 0.91   . Renal infarct     R  . History of DVT of lower extremity     on chronic coumadin  . Chest pain     stres test neg x 2, cath 5/12; minimal LAD irregs; no obs CAD; normal LVF  . Internal hemorrhoids     Cscope 2007  . Polysubstance abuse    Past Surgical History  Procedure Laterality Date  . Cholecystectomy    . Left leg blood clot removal 2009  2009  . Tonsillectomy    . Wrist surgery     Family History  Problem Relation Age of Onset  . Hypertension Mother   . Hypertension Father   . CAD Father   . Lung cancer Maternal Uncle   . Pancreatic cancer Brother    Social History  Substance Use Topics  . Smoking status: Current Every Day Smoker -- 0.00 packs/day for 30 years    Types: Cigarettes  .  Smokeless tobacco: Never Used  . Alcohol Use: Yes    Review of Systems  Respiratory: Negative for shortness of breath.   Cardiovascular: Negative for chest pain, palpitations and leg swelling.  Genitourinary: Negative for testicular pain.  Musculoskeletal: Positive for arthralgias.  Skin: Negative for color change.       -calor  All other systems reviewed and are negative.  Allergies  Pravastatin and Simvastatin  Home Medications   Prior to Admission medications   Medication Sig Start Date End Date Taking? Authorizing Provider  lisinopril (PRINIVIL,ZESTRIL) 10 MG tablet Take 1 tablet (10 mg total) by mouth daily. 03/05/15  Yes Sherren Mocha, MD  warfarin (COUMADIN) 10 MG tablet Take as directed by coumadin clinic Patient taking differently: Take 5-10 mg by mouth See admin instructions. Take 1/2 tablet on Wednesday then take 1 tablet all the other days 02/05/15  Yes Sherren Mocha, MD  oxyCODONE-acetaminophen (PERCOCET/ROXICET) 5-325 MG tablet Take 1 tablet by mouth every 6 (six) hours as needed for severe pain. 05/07/15   Merryl Hacker, MD   BP 122/91 mmHg  Pulse 78  Temp(Src) 97.7 F (36.5 C) (Oral)  Resp 17  SpO2 99% Physical Exam  Constitutional: He is oriented to person,  place, and time. He appears well-developed and well-nourished. No distress.  HENT:  Head: Normocephalic and atraumatic.  Cardiovascular: Normal rate and regular rhythm.   Pulmonary/Chest: Effort normal. No respiratory distress.  Musculoskeletal: He exhibits no edema.  Focused examination of the right lower [place] reveals 2+ pedal pulses, no swelling noted, tenderness palpation of the right medial thigh, no overlying skin changes, no obvious deformities  Neurological: He is alert and oriented to person, place, and time.  Skin: Skin is warm and dry.  Psychiatric: He has a normal mood and affect.  Nursing note and vitals reviewed.   ED Course  Procedures (including critical care time) DIAGNOSTIC  STUDIES: Oxygen Saturation is 99% on RA, normal by my interpretation.    COORDINATION OF CARE: 3:54 AM Discussed treatment plan with pt at bedside and pt agreed to plan.   Labs Review Labs Reviewed  PROTIME-INR - Abnormal; Notable for the following:    Prothrombin Time 17.6 (*)    All other components within normal limits    Imaging Review No results found. I have personally reviewed and evaluated these images and lab results as part of my medical decision-making.   EKG Interpretation None      MDM   Final diagnoses:  Pain of right lower extremity  History of DVT (deep vein thrombosis)    Patient presents with pain to the right thigh. Onset one day. History of DVT and on Coumadin. Patient reports that sometimes he forgets to take his Coumadin. Forgot a dose yesterday. No evidence of swelling. There is tenderness without any obvious skin changes or deformity. He is subtherapeutic with his INR. Will have patient return tomorrow for ultrasound of that extremity.  After history, exam, and medical workup I feel the patient has been appropriately medically screened and is safe for discharge home. Pertinent diagnoses were discussed with the patient. Patient was given return precautions.  I personally performed the services described in this documentation, which was scribed in my presence. The recorded information has been reviewed and is accurate.   Merryl Hacker, MD 05/07/15 [place], MD 05/07/15 [phone]

## 2015-05-07 NOTE — Telephone Encounter (Signed)
Vascular lab called: The ER rx  ultrasound last night, it showed no DVT but has a superficial phlebitis. Patient is on Coumadin, last INR not therapeutic. Call patient: 1. Needs to continue working with the Coumadin clinic to get the INR therapeutic 2. Start aspirin 81 twice a day for one week 3. Cold compress on top of the area of inflammation, call or go the ER if the area gets worse.

## 2015-05-07 NOTE — Telephone Encounter (Signed)
LMOM informing Pt of ultrasound results, and Dr. Larose Kells recommendations. Instructed Pt to call or go to ED if not getting better or he becomes worse. Also informed him that Dr. Larose Kells does also recommend a routine F/U appt, Pt has not been seen since 11/2013. Informed Pt after 3 years of not being seen he will be consider a new Pt and will no longer be under the care of Dr. Larose Kells. Instructed him to call office to schedule appt at his earliest convenience.

## 2015-05-07 NOTE — ED Notes (Signed)
Pt. reports right inner thigh pain onset this morning , denies injury , pt. concerned about his history of DVT , respirations unlabored , pt. added mild headache .

## 2015-05-07 NOTE — Discharge Instructions (Signed)
You were seen today for leg pain. Given your history of DVT, you need to return tomorrow for lower extremity ultrasound imaging. Your Coumadin levels were subtherapeutic.  If you develop chest pain, shortness breath, or any new or worsening symptoms should be reevaluated.

## 2015-05-07 NOTE — Telephone Encounter (Signed)
Call Documentation      Wilfrid Lund, CMA at 05/07/2015 11:30 AM     Status: Signed       Expand All Collapse All   LMOM informing Pt of ultrasound results, and Dr. Larose Kells recommendations. Instructed Pt to call or go to ED if not getting better or he becomes worse. Also informed him that Dr. Larose Kells does also recommend a routine F/U appt, Pt has not been seen since 11/2013. Informed Pt after 3 years of not being seen he will be consider a new Pt and will no longer be under the care of Dr. Larose Kells. Instructed him to call office to schedule appt at his earliest convenience.             Colon Branch, MD at 05/07/2015 8:50 AM     Status: Signed       Expand All Collapse All   Vascular lab called: The ER rx ultrasound last night, it showed no DVT but has a superficial phlebitis. Patient is on Coumadin, last INR not therapeutic. Call patient: 1. Needs to continue working with the Coumadin clinic to get the INR therapeutic 2. Start aspirin 81 twice a day for one week 3. Cold compress on top of the area of inflammation, call or go the ER if the area gets worse.        Please see above. Already called and LMOM at Pt's home number. Recommendations given. Also Pt needs 30 minute F/U appt here with Dr. Larose Kells, he has not been seen since 11/2013. Thank you.

## 2015-05-07 NOTE — Progress Notes (Signed)
*  Preliminary Results* Right lower extremity venous duplex completed. Right lower extremity is negative for deep vein thrombosis. There is evidence of superficial vein thrombosis involving the greater saphenous vein. There is no evidence of right Baker's cyst.  Preliminary results discussed with Dr. Larose Kells.  05/07/2015 8:50 AM  Maudry Mayhew, RVT, RDCS, RDMS

## 2015-05-07 NOTE — Telephone Encounter (Signed)
Relation to pt: self Call back [place]:  Reason for call:  Patient states he had an appointment with [facility] today and they advised patient that PCP would call if with instruction. Patient states he is in pain and would like to hear from office today

## 2015-06-02 ENCOUNTER — Other Ambulatory Visit: Payer: Self-pay | Admitting: Cardiovascular Disease

## 2015-06-03 ENCOUNTER — Ambulatory Visit (INDEPENDENT_AMBULATORY_CARE_PROVIDER_SITE_OTHER): Payer: BLUE CROSS/BLUE SHIELD | Admitting: *Deleted

## 2015-06-03 DIAGNOSIS — Z5181 Encounter for therapeutic drug level monitoring: Secondary | ICD-10-CM

## 2015-06-03 DIAGNOSIS — Z7901 Long term (current) use of anticoagulants: Secondary | ICD-10-CM | POA: Diagnosis not present

## 2015-06-03 LAB — POCT INR: INR: 3

## 2015-07-06 ENCOUNTER — Other Ambulatory Visit: Payer: Self-pay | Admitting: Cardiovascular Disease

## 2015-07-09 ENCOUNTER — Ambulatory Visit (INDEPENDENT_AMBULATORY_CARE_PROVIDER_SITE_OTHER): Payer: BLUE CROSS/BLUE SHIELD | Admitting: *Deleted

## 2015-07-09 DIAGNOSIS — Z7901 Long term (current) use of anticoagulants: Secondary | ICD-10-CM | POA: Diagnosis not present

## 2015-07-09 DIAGNOSIS — Z5181 Encounter for therapeutic drug level monitoring: Secondary | ICD-10-CM

## 2015-07-09 LAB — POCT INR: INR: 1.4

## 2015-07-12 ENCOUNTER — Encounter (HOSPITAL_COMMUNITY): Payer: Self-pay | Admitting: *Deleted

## 2015-07-12 ENCOUNTER — Emergency Department (HOSPITAL_COMMUNITY): Payer: BLUE CROSS/BLUE SHIELD

## 2015-07-12 ENCOUNTER — Emergency Department (HOSPITAL_COMMUNITY)
Admission: EM | Admit: 2015-07-12 | Discharge: 2015-07-12 | Disposition: A | Discharge status: Home / Self Care | Payer: BLUE CROSS/BLUE SHIELD | Attending: Emergency Medicine | Admitting: Emergency Medicine

## 2015-07-12 DIAGNOSIS — R109 Unspecified abdominal pain: Secondary | ICD-10-CM | POA: Diagnosis not present

## 2015-07-12 DIAGNOSIS — Z86718 Personal history of other venous thrombosis and embolism: Secondary | ICD-10-CM | POA: Insufficient documentation

## 2015-07-12 DIAGNOSIS — Z7901 Long term (current) use of anticoagulants: Secondary | ICD-10-CM | POA: Insufficient documentation

## 2015-07-12 DIAGNOSIS — M549 Dorsalgia, unspecified: Secondary | ICD-10-CM | POA: Diagnosis not present

## 2015-07-12 DIAGNOSIS — Z8719 Personal history of other diseases of the digestive system: Secondary | ICD-10-CM | POA: Insufficient documentation

## 2015-07-12 DIAGNOSIS — R300 Dysuria: Secondary | ICD-10-CM | POA: Diagnosis present

## 2015-07-12 DIAGNOSIS — Z8679 Personal history of other diseases of the circulatory system: Secondary | ICD-10-CM | POA: Diagnosis not present

## 2015-07-12 LAB — I-STAT CHEM 8, ED
BUN: 12 mg/dL (ref 6–20)
CHLORIDE: 109 mmol/L (ref 101–111)
Calcium, Ion: 1.21 mmol/L (ref 1.12–1.23)
Creatinine, Ser: 1.2 mg/dL (ref 0.61–1.24)
Glucose, Bld: 94 mg/dL (ref 65–99)
HEMATOCRIT: 43 % (ref 39.0–52.0)
Hemoglobin: 14.6 g/dL (ref 13.0–17.0)
POTASSIUM: 3.5 mmol/L (ref 3.5–5.1)
SODIUM: 142 mmol/L (ref 135–145)
TCO2: 20 mmol/L (ref 0–100)

## 2015-07-12 LAB — URINALYSIS, ROUTINE W REFLEX MICROSCOPIC
Bilirubin Urine: NEGATIVE
Glucose, UA: NEGATIVE mg/dL
Hgb urine dipstick: NEGATIVE
Ketones, ur: NEGATIVE mg/dL
LEUKOCYTES UA: NEGATIVE
NITRITE: NEGATIVE
Protein, ur: NEGATIVE mg/dL
SPECIFIC GRAVITY, URINE: 1.017 (ref 1.005–1.030)
pH: 7 (ref 5.0–8.0)

## 2015-07-12 MED ORDER — METHOCARBAMOL 500 MG PO TABS
1000.0000 mg | ORAL_TABLET | Freq: Once | ORAL | Status: AC
  Administered 2015-07-12: 1000 mg via ORAL
  Filled 2015-07-12: qty 2

## 2015-07-12 MED ORDER — KETOROLAC TROMETHAMINE 60 MG/2ML IM SOLN
60.0000 mg | Freq: Once | INTRAMUSCULAR | Status: AC
  Administered 2015-07-12: 60 mg via INTRAMUSCULAR
  Filled 2015-07-12: qty 2

## 2015-07-12 MED ORDER — HYDROCODONE-ACETAMINOPHEN 5-325 MG PO TABS
1.0000 | ORAL_TABLET | Freq: Once | ORAL | Status: AC
  Administered 2015-07-12: 1 via ORAL
  Filled 2015-07-12: qty 1

## 2015-07-12 MED ORDER — METHOCARBAMOL 500 MG PO TABS: 1000.0000 mg | ORAL_TABLET | Freq: Two times a day (BID) | ORAL | Status: DC | PRN

## 2015-07-12 NOTE — ED Notes (Signed)
Pt reports recent pain with urination, now has left side back pain that is starting to radiates around to abd. Denies fever.

## 2015-07-12 NOTE — ED Provider Notes (Signed)
CSN: [id number]     Arrival date & time 07/12/15  1717 History   First MD Initiated Contact with Patient 07/12/15 1732     Chief Complaint  Patient presents with  . Back Pain  . Dysuria     (Consider location/radiation/quality/duration/timing/severity/associated sxs/prior Treatment) Patient is a 54 y.o. male presenting with back pain and dysuria. The history is provided by the patient and medical records. No language interpreter was used.  Back Pain Associated symptoms: dysuria   Associated symptoms: no abdominal pain, no headaches and no weakness   Dysuria Pertinent negatives include no abdominal pain, arthralgias, congestion, coughing, headaches, myalgias, nausea, neck pain, rash, sore throat, vomiting or weakness.  [name] is a 54 y.o. male  with a PMH of PAD, DVT's - on warfarin, who presents to the Emergency Department complaining of worsening sharp, stabbing back pain x 2 days - originally in left back, pain migrating and at present in left flank. Aggravated by movement. BC powder taken with no relief. One week ago patient had episode of dysuria which has now resolved. No other associated symptoms. Denies saddle anesthesia, incontinence, numbness/tingling, fever, night sweats. No hx of stones.    Past Medical History  Diagnosis Date  . PAD (peripheral artery disease) ([place])     a. left leg ischemia tx wtih embolectomy of left fem, pop, tib arteries with Dr. Scot Dock - 08/2006;  b. known occlusion of right pop with collats - med Rx;  c.ABI's 8/09: R 1.0; L 0.91   . Renal infarct ([place])     R  . History of DVT of lower extremity     on chronic coumadin  . Chest pain     stres test neg x 2, cath 5/12; minimal LAD irregs; no obs CAD; normal LVF  . Internal hemorrhoids     Cscope 2007  . Polysubstance abuse    Past Surgical History  Procedure Laterality Date  . Cholecystectomy    . Left leg blood clot removal 2009  2009  . Tonsillectomy    . Wrist surgery     Family  History  Problem Relation Age of Onset  . Hypertension Mother   . Hypertension Father   . CAD Father   . Lung cancer Maternal Uncle   . Pancreatic cancer Brother    Social History  Substance Use Topics  . Smoking status: Current Every Day Smoker -- 0.00 packs/day for 30 years    Types: Cigarettes  . Smokeless tobacco: Never Used  . Alcohol Use: Yes    Review of Systems  Constitutional: Negative.   HENT: Negative for congestion, rhinorrhea and sore throat.   Eyes: Negative for visual disturbance.  Respiratory: Negative for cough, shortness of breath and wheezing.   Cardiovascular: Negative.   Gastrointestinal: Negative for nausea, vomiting, abdominal pain, diarrhea and constipation.  Endocrine: Negative for polydipsia and polyuria.  Genitourinary: Positive for dysuria and flank pain. Negative for urgency, frequency, hematuria, discharge and penile pain.  Musculoskeletal: Positive for back pain. Negative for myalgias, arthralgias and neck pain.  Skin: Negative for rash.  Neurological: Negative for dizziness, weakness and headaches.      Allergies  Pravastatin and Simvastatin  Home Medications   Prior to Admission medications   Medication Sig Start Date End Date Taking? Authorizing Provider  Aspirin-Salicylamide-Caffeine (BC HEADACHE POWDER PO) Take 1 packet by mouth every 8 (eight) hours as needed (pain).   Yes Historical Provider, MD  lisinopril (PRINIVIL,ZESTRIL) 10 MG tablet Take 1 tablet (  10 mg total) by mouth daily. 03/05/15  Yes Sherren Mocha, MD  warfarin (COUMADIN) 10 MG tablet Take 5-10 mg by mouth daily. 10mg  daily except 5mg  on Wednesdays   Yes Historical Provider, MD  methocarbamol (ROBAXIN) 500 MG tablet Take 2 tablets (1,000 mg total) by mouth 2 (two) times daily as needed for muscle spasms. 07/12/15   Eshal Propps Pilcher Oliviya Gilkison, PA-C   BP 125/95 mmHg  Pulse 73  Temp(Src) 98.3 F (36.8 C) (Oral)  Resp 20  SpO2 97% Physical Exam  Constitutional: He is oriented to  person, place, and time. He appears well-developed and well-nourished.  Alert and in no acute distress  HENT:  Head: Normocephalic and atraumatic.  Cardiovascular: Normal rate, regular rhythm, normal heart sounds and intact distal pulses.  Exam reveals no gallop and no friction rub.   No murmur heard. Pulmonary/Chest: Effort normal and breath sounds normal. No respiratory distress. He has no wheezes. He has no rales. He exhibits no tenderness.  Abdominal: He exhibits no mass. There is no rebound and no guarding.  Abdomen soft, non-tender, non-distended Bowel sounds positive in all four quadrants  Musculoskeletal:       Arms: No midline or paraspinal tenderness No CVA tenderness TTP as depicted in image: left flank Full lumbar ROM: pain with flexion/extension  Neurological: He is alert and oriented to person, place, and time.  CN 2-12 grossly intact. 5/5 muscle strength in all four extremities. All four extremities neurovascularly intact.  Skin: Skin is warm and dry. No rash noted. He is not diaphoretic.  Psychiatric: He has a normal mood and affect. His behavior is normal. Judgment and thought content normal.  Nursing note and vitals reviewed.   ED Course  Procedures (including critical care time) Labs Review Labs Reviewed  URINALYSIS, ROUTINE W REFLEX MICROSCOPIC (NOT AT [facility])  I-STAT CHEM 8, ED    Imaging Review Ct Renal Stone Study  07/12/2015  CLINICAL DATA:  Left-sided back pain today with pain with urination. EXAM: CT ABDOMEN AND PELVIS WITHOUT CONTRAST TECHNIQUE: Multidetector CT imaging of the abdomen and pelvis was performed following the standard protocol without IV contrast. COMPARISON:  None. FINDINGS: Lower chest: The lung bases are clear of acute process. No worrisome pulmonary lesions or pleural effusion. The heart is normal in size. No pericardial effusion. The distal esophagus is grossly normal. Hepatobiliary: No focal hepatic lesions or intrahepatic biliary  dilatation. The gallbladder is surgically absent. No common bile duct dilatation. Pancreas: No mass, inflammation or ductal dilatation. Spleen: Normal size.  No focal lesions. Adrenals/Urinary Tract: The adrenal glands are normal. No renal calculi. Moderate scarring changes involving the right kidney. No hydronephrosis. No obstructing ureteral calculi or bladder calculi. Stomach/Bowel: The stomach, duodenum, small bowel and colon are grossly normal without oral contrast. No inflammatory changes, mass lesions or obstructive findings. The terminal ileum is normal. The appendix is normal. Scattered colonic diverticuli but no findings for acute diverticulitis. Vascular/Lymphatic: No mesenteric or retroperitoneal mass or adenopathy. Scattered aortic calcifications but no aneurysm. Other: The bladder, prostate gland and seminal vesicles are unremarkable. No pelvic mass or lymphadenopathy. No free pelvic fluid collections. Bilateral inguinal hernias containing fat, right greater than left. Musculoskeletal: No significant bony findings. They left-sided pars defect is noted at L5. IMPRESSION: No acute abdominal/ pelvic findings, mass lesions or adenopathy. No renal, ureteral or bladder calculi. Moderate scarring changes involving the right kidney. Bilateral inguinal hernias. Electronically Signed   By: Marijo Sanes M.D.   On: 07/12/2015 19:57  I have personally reviewed and evaluated these images and lab results as part of my medical decision-making.   EKG Interpretation None      MDM   Final diagnoses:  Flank pain, acute   [name] presents with non-specific back and flank pain musculoskeletal vs. Stone - Pt. Admits to episode of dysuria ~ 1 week ago. Pain migrated from back to left flank. Will do CT stone study, if negative, tx as musculoskeletal.   CT shows no stone or acute abdominal findings. Urine & chem 8 wdl. Will give toradol and robaxin here, discharge to home with return precautions and  good follow up.   Patient discussed with Dr. Eulis Foster who agrees with treatment plan.    [facility] Nazaiah Navarrete, PA-C 07/12/15 2052  Daleen Bo, MD 07/13/15 2037

## 2015-07-12 NOTE — Discharge Instructions (Signed)
Back Pain:  Your back pain should be treated with medicines such as ibuprofen or aleve and this back pain should get better over the next 2 weeks.  However if you develop severe or worsening pain, low back pain with fever, numbness, weakness or inability to walk or urinate, you should return to the ER immediately.  Please follow up with your doctor this week for a recheck if still having symptoms.  Low back pain is discomfort in the lower back that may be due to injuries to muscles and ligaments around the spine. The pain may last several days or a week;  However, most patients get completely well in 4 weeks.  COLD THERAPY DIRECTIONS:  Ice or gel packs can be used to reduce both pain and swelling. Ice is the most helpful within the first 24 to 48 hours after an injury or flareup from overusing a muscle or joint.  Ice is effective, has very few side effects, and is safe for most people to use.   If you expose your skin to cold temperatures for too long or without the proper protection, you can damage your skin or nerves. Watch for signs of skin damage due to cold.   HOME CARE INSTRUCTIONS  Follow these tips to use ice and cold packs safely.  Place a dry or damp towel between the ice and skin. A damp towel will cool the skin more quickly, so you may need to shorten the time that the ice is used.  For a more rapid response, add gentle compression to the ice.  Ice for no more than 10 to 20 minutes at a time. The bonier the area you are icing, the less time it will take to get the benefits of ice.  Check your skin after 5 minutes to make sure there are no signs of a poor response to cold or skin damage.  Rest 20 minutes or more in between uses.  Once your skin is numb, you can end your treatment. You can test numbness by very lightly touching your skin. The touch should be so light that you do not see the skin dimple from the pressure of your fingertip. When using ice, most people will feel these normal  sensations in this order: cold, burning, aching, and numbness.  Do not use ice on someone who cannot communicate their responses to pain, such as small children or people with dementia.   Medications are also useful to help with pain control.  A commonly prescribed medications includes acetaminophen.  This medication is generally safe, though you should not take more than 8 of the extra strength (500mg ) pills a day.  Non steroidal anti inflammatory medications including Ibuprofen and naproxen;  These medications help both pain and swelling and are very useful in treating back pain.  They should be taken with food, as they can cause stomach upset, and more seriously, stomach bleeding.    Muscle relaxants:  These medications can help with muscle tightness that is a cause of lower back pain.  Most of these medications can cause drowsiness, and it is not safe to drive or use dangerous machinery while taking them.  You will need to follow up with  Your primary healthcare provider in 1-2 weeks for reassessment.  Be aware that if you develop new symptoms, such as a fever, leg weakness, difficulty with or loss of control of your urine or bowels, abdominal pain, or more severe pain, you will need to seek medical attention and  /  or return to the Emergency department.

## 2015-07-13 ENCOUNTER — Ambulatory Visit (INDEPENDENT_AMBULATORY_CARE_PROVIDER_SITE_OTHER): Payer: BLUE CROSS/BLUE SHIELD | Admitting: Medical

## 2015-07-13 ENCOUNTER — Ambulatory Visit (HOSPITAL_BASED_OUTPATIENT_CLINIC_OR_DEPARTMENT_OTHER)
Admission: RE | Admit: 2015-07-13 | Discharge: 2015-07-13 | Disposition: A | Discharge status: Home / Self Care | Payer: BLUE CROSS/BLUE SHIELD | Source: Ambulatory Visit | Attending: Medical | Admitting: Medical

## 2015-07-13 ENCOUNTER — Encounter: Payer: Self-pay | Admitting: Medical

## 2015-07-13 VITALS — BP 114/76 | HR 81 | Temp 98.4°F | Ht 73.0 in | Wt 212.0 lb

## 2015-07-13 DIAGNOSIS — R0781 Pleurodynia: Secondary | ICD-10-CM | POA: Insufficient documentation

## 2015-07-13 MED ORDER — HYDROCODONE-ACETAMINOPHEN 5-325 MG PO TABS: 1.0000 | ORAL_TABLET | Freq: Four times a day (QID) | ORAL | Status: DC | PRN

## 2015-07-13 NOTE — Progress Notes (Signed)
Subjective:    Patient ID: [name], male    DOB: 1961/04/05, 54 y.o.   MRN: [id number]  HPI  Pt in with some left sided flank pain. He states some severe pain on Friday. He noticed at first when simply  Coughing(no sob, no wheezing and no chest pain) No leg pain reported either. Pt thought maybe some urinary issues(not ua in ED was negative). Pt went to ED last night. Pt had ct renal stone study(study was negative). Pt given some methocarbinol and injection for pain.   Pt describes sharp pain on movement of thorax. Also some pain when he coughs. Pain level varies 7-9 level pain. Pt states when he twist thorax will experience brief high level pain.  Pt delivery driver for sheets.  Pt denies any fever ,no chills or sweats.      Review of Systems  Constitutional: Negative for fever, chills and fatigue.  Respiratory: Positive for wheezing. Negative for cough and shortness of breath.   Cardiovascular: Negative for chest pain and palpitations.  Genitourinary: Negative for dysuria, urgency and frequency.  Musculoskeletal: Negative for back pain.       Left flank/left lower rib area pain  Hematological: Negative for adenopathy. Does not bruise/bleed easily.  Psychiatric/Behavioral: Negative for behavioral problems and confusion.    Past Medical History  Diagnosis Date  . PAD (peripheral artery disease) ([place])     a. left leg ischemia tx wtih embolectomy of left fem, pop, tib arteries with Dr. Scot Dock - 08/2006;  b. known occlusion of right pop with collats - med Rx;  c.ABI's 8/09: R 1.0; L 0.91   . Renal infarct ([place])     R  . History of DVT of lower extremity     on chronic coumadin  . Chest pain     stres test neg x 2, cath 5/12; minimal LAD irregs; no obs CAD; normal LVF  . Internal hemorrhoids     Cscope 2007  . Polysubstance abuse     Social History   Social History  . Marital Status: Married    Spouse Name: N/A  . Number of Children: 2  . Years of Education:  N/A   Occupational History  . [employer]    Social History Main Topics  . Smoking status: Current Every Day Smoker -- 0.00 packs/day for 30 years    Types: Cigarettes  . Smokeless tobacco: Never Used  . Alcohol Use: Yes  . Drug Use: Yes    Special: Marijuana     Comment: h/o abuse, denies at this time 11-2013, states "from time to time"  . Sexual Activity: Not on file   Other Topics Concern  . Not on file   Social History Narrative    Past Surgical History  Procedure Laterality Date  . Cholecystectomy    . Left leg blood clot removal 2009  2009  . Tonsillectomy    . Wrist surgery      Family History  Problem Relation Age of Onset  . Hypertension Mother   . Hypertension Father   . CAD Father   . Lung cancer Maternal Uncle   . Pancreatic cancer Brother     Allergies  Allergen Reactions  . Pravastatin Itching  . Simvastatin Itching    Current Outpatient Prescriptions on File Prior to Visit  Medication Sig Dispense Refill  . Aspirin-Salicylamide-Caffeine (BC HEADACHE POWDER PO) Take 1 packet by mouth every 8 (eight) hours as needed (pain).    [name]  lisinopril (PRINIVIL,ZESTRIL) 10 MG tablet Take 1 tablet (10 mg total) by mouth daily. 30 tablet 6  . warfarin (COUMADIN) 10 MG tablet Take 5-10 mg by mouth daily. 10mg  daily except 5mg  on Wednesdays    . methocarbamol (ROBAXIN) 500 MG tablet Take 2 tablets (1,000 mg total) by mouth 2 (two) times daily as needed for muscle spasms. (Patient not taking: Reported on 07/13/2015) 20 tablet 0   No current facility-administered medications on file prior to visit.    BP 114/76 mmHg  Pulse 81  Temp(Src) 98.4 F (36.9 C) (Oral)  Ht 6\' 1"  (1.854 m)  Wt 212 lb (96.163 kg)  BMI 27.98 kg/m2  SpO2 97%       Objective:   Physical Exam  General  Mental Status - Alert. General Appearance - Well groomed. Not in acute distress.  Skin Rashes- No Rashes over ribs   Neck Neck- Supple. No Masses. No jvd. No tracheal  deviation   Chest and Lung Exam Auscultation: Breath Sounds:- even and unlabored.  Cardiovascular Auscultation:Rythm- Regular, rate and rhythm. Murmurs & Other Heart Sounds:Ausculatation of the heart reveal- No Murmurs.  Lymphatic Head & Neck General Head & Neck Lymphatics: Bilateral: Description- No Localized lymphadenopathy.  Left side ribs- no rash. But pain on inspiration. Mild pain on of lower ribs  palpation ribs.  Lower ext- lower ext negative homans signs bilateral. No pretibial edema.        Assessment & Plan:  I reviewed note and negative ct study. But on exam your pain seems mostly over lower rib area. Will get cxr and rib series. To help you sleep will rx norco to use at night and during day if needed. But do not drive while on norco. Use robaxin as well if needed.(same precaution on driving)  Pt on has appointment with coumadin clinic mid December. Keep appointment and advise make sure compliant on coumadin. Explained importance of staying on coumadin as instructed particularly since he drives a lot. Explained that if his symptoms change such as leg pain or if sob, or chest pain then he may need ct of chest or doppler(important to notify us and be seen if such occurs). But this is not indicated presently.

## 2015-07-13 NOTE — Progress Notes (Signed)
Pre visit review using our clinic review tool, if applicable. No additional management support is needed unless otherwise documented below in the visit note. 

## 2015-07-13 NOTE — Patient Instructions (Addendum)
I reviewed note and negative ct study. But on exam your pain seems mostly over lower rib area. Will get cxr and rib series. To help you sleep will rx norco to use at night and during day if needed. But do not drive while on norco. Use robaxin as well if needed.(same precaution on driving)  Follow up in 7 days or as needed

## 2015-08-21 ENCOUNTER — Ambulatory Visit (INDEPENDENT_AMBULATORY_CARE_PROVIDER_SITE_OTHER): Payer: BLUE CROSS/BLUE SHIELD | Admitting: *Deleted

## 2015-08-21 DIAGNOSIS — Z7901 Long term (current) use of anticoagulants: Secondary | ICD-10-CM | POA: Diagnosis not present

## 2015-08-21 DIAGNOSIS — Z5181 Encounter for therapeutic drug level monitoring: Secondary | ICD-10-CM

## 2015-08-21 LAB — POCT INR: INR: 2.4

## 2015-08-21 MED ORDER — WARFARIN SODIUM 10 MG PO TABS: 5.0000 mg | ORAL_TABLET | Freq: Every day | ORAL | Status: DC

## 2015-09-04 ENCOUNTER — Telehealth: Payer: Self-pay | Admitting: Internal Medicine

## 2015-09-04 NOTE — Telephone Encounter (Signed)
Pt declined flu shot and declined scheduling cpe/follow up with Dr. Larose Kells. Last seen 11/2013 with Dr. Larose Kells.

## 2015-09-04 NOTE — Telephone Encounter (Signed)
Noted. Flu shot declined in chart.

## 2015-09-30 ENCOUNTER — Ambulatory Visit (INDEPENDENT_AMBULATORY_CARE_PROVIDER_SITE_OTHER): Payer: BLUE CROSS/BLUE SHIELD | Admitting: *Deleted

## 2015-09-30 DIAGNOSIS — Z7901 Long term (current) use of anticoagulants: Secondary | ICD-10-CM | POA: Diagnosis not present

## 2015-09-30 DIAGNOSIS — Z5181 Encounter for therapeutic drug level monitoring: Secondary | ICD-10-CM

## 2015-09-30 LAB — POCT INR: INR: 2.5

## 2015-09-30 MED ORDER — WARFARIN SODIUM 10 MG PO TABS: 5.0000 mg | ORAL_TABLET | Freq: Every day | ORAL | Status: DC

## 2015-10-15 ENCOUNTER — Encounter: Payer: Self-pay | Admitting: Gastroenterology

## 2015-10-27 ENCOUNTER — Telehealth: Payer: Self-pay | Admitting: Cardiovascular Disease

## 2015-10-27 NOTE — Telephone Encounter (Signed)
New message     1. What dental office are you calling from? Dr. Wyline Beady   2. What is your office phone and fax number? [phone]  3. What type of procedure is the patient having performed? Extraction / deep cleaning   4. What date is procedure scheduled? Pending   5. What is your question (ex. Antibiotics prior to procedure, holding medication-we need to know how long dentist wants pt to hold med)? Warfarin

## 2015-10-28 ENCOUNTER — Ambulatory Visit (INDEPENDENT_AMBULATORY_CARE_PROVIDER_SITE_OTHER): Payer: BLUE CROSS/BLUE SHIELD | Admitting: Medical

## 2015-10-28 ENCOUNTER — Encounter: Payer: Self-pay | Admitting: Medical

## 2015-10-28 ENCOUNTER — Ambulatory Visit (HOSPITAL_BASED_OUTPATIENT_CLINIC_OR_DEPARTMENT_OTHER)
Admission: RE | Admit: 2015-10-28 | Discharge: 2015-10-28 | Disposition: A | Discharge status: Home / Self Care | Payer: BLUE CROSS/BLUE SHIELD | Source: Ambulatory Visit | Attending: Medical | Admitting: Medical

## 2015-10-28 VITALS — BP 114/74 | HR 89 | Temp 98.5°F | Ht 73.0 in | Wt 213.0 lb

## 2015-10-28 DIAGNOSIS — M79662 Pain in left lower leg: Secondary | ICD-10-CM

## 2015-10-28 DIAGNOSIS — M79605 Pain in left leg: Secondary | ICD-10-CM | POA: Diagnosis not present

## 2015-10-28 DIAGNOSIS — M79609 Pain in unspecified limb: Secondary | ICD-10-CM

## 2015-10-28 DIAGNOSIS — M7989 Other specified soft tissue disorders: Secondary | ICD-10-CM | POA: Diagnosis not present

## 2015-10-28 MED ORDER — HYDROCODONE-ACETAMINOPHEN 5-325 MG PO TABS: 1.0000 | ORAL_TABLET | Freq: Four times a day (QID) | ORAL | Status: DC | PRN

## 2015-10-28 NOTE — Patient Instructions (Addendum)
For your calf pain and pain behind the knee will rx norco.  Wear compression stocking or ace wrap to support calf.  Based on your dvt history and swelling of the leg will get lower ext doppler.  Work excuse. May refer you to sports medicine to shorten recovery time.   Follow up in 7 days or as needed

## 2015-10-28 NOTE — Progress Notes (Signed)
Pre visit review using our clinic review tool, if applicable. No additional management support is needed unless otherwise documented below in the visit note. 

## 2015-10-28 NOTE — Progress Notes (Signed)
Subjective:    Patient ID: [name], male    DOB: [birth year], 54 y.o.   MRN: [id number]  HPI  Pt in for left calf pain. Pt was racing his 28 year old grandson. He just took off fast and felt extreme sharp pain in his left calf. Pt when he walks has severe pain. Not getting better. 3 days since onset of pain.  Pt has history of very large dvt 9 years ago. He states he got surgery for this. Pt goes to coumadin clinic. He thinks he was supposed to get inr  today. Pt inr was 2.5 one month ago. He will see inr clinic tomorrow/reschedule.   Review of Systems  Cardiovascular: Negative for chest pain and palpitations.  Musculoskeletal: Negative for myalgias, gait problem and neck stiffness.       Rt calf pain. Rt leg pain in popliteal area some.  Skin: Negative for pallor.  Neurological: Negative for dizziness and headaches.  Hematological: Negative for adenopathy. Does not bruise/bleed easily.  Psychiatric/Behavioral: Negative for behavioral problems and confusion. The patient is not nervous/anxious.     Past Medical History  Diagnosis Date  . PAD (peripheral artery disease) ([place])     a. left leg ischemia tx wtih embolectomy of left fem, pop, tib arteries with Dr. Scot Dock - 08/2006;  b. known occlusion of right pop with collats - med Rx;  c.ABI's 8/09: R 1.0; L 0.91   . Renal infarct ([place])     R  . History of DVT of lower extremity     on chronic coumadin  . Chest pain     stres test neg x 2, cath 5/12; minimal LAD irregs; no obs CAD; normal LVF  . Internal hemorrhoids     Cscope 2007  . Polysubstance abuse     Social History   Social History  . Marital Status: Married    Spouse Name: N/A  . Number of Children: 2  . Years of Education: N/A   Occupational History  . [employer]    Social History Main Topics  . Smoking status: Current Every Day Smoker -- 0.00 packs/day for 30 years    Types: Cigarettes  . Smokeless tobacco: Never Used  . Alcohol Use: Yes    . Drug Use: Yes    Special: Marijuana     Comment: h/o abuse, denies at this time 11-2013, states "from time to time"  . Sexual Activity: Not on file   Other Topics Concern  . Not on file   Social History Narrative    Past Surgical History  Procedure Laterality Date  . Cholecystectomy    . Left leg blood clot removal 2009  2009  . Tonsillectomy    . Wrist surgery      Family History  Problem Relation Age of Onset  . Hypertension Mother   . Hypertension Father   . CAD Father   . Lung cancer Maternal Uncle   . Pancreatic cancer Brother     Allergies  Allergen Reactions  . Pravastatin Itching  . Simvastatin Itching    Current Outpatient Prescriptions on File Prior to Visit  Medication Sig Dispense Refill  . Aspirin-Salicylamide-Caffeine (BC HEADACHE POWDER PO) Take 1 packet by mouth every 8 (eight) hours as needed (pain).    [name] HYDROcodone-acetaminophen (NORCO) 5-325 MG tablet Take 1 tablet by mouth every 6 (six) hours as needed for moderate pain. 30 tablet 0  . lisinopril (PRINIVIL,ZESTRIL) 10 MG tablet Take 1 tablet (  10 mg total) by mouth daily. 30 tablet 6  . warfarin (COUMADIN) 10 MG tablet Take 0.5-1 tablets (5-10 mg total) by mouth daily. 10mg  daily except 5mg  on Wednesdays 35 tablet 0   No current facility-administered medications on file prior to visit.    BP 114/74 mmHg  Pulse 89  Temp(Src) 98.5 F (36.9 C) (Oral)  Ht 6\' 1"  (1.854 m)  Wt 213 lb (96.616 kg)  BMI 28.11 kg/m2  SpO2 96%       Objective:   Physical Exam  General- No acute distress. Pleasant patient. Neck- Full range of motion, no jvd Lungs- Clear, even and unlabored. Heart- regular rate and rhythm. Neurologic- CNII- XII grossly intact.  Lt leg- calf is swollen. Very tender mid calf posterior aspect. Swelling and tenderness in rt popliteal.      Assessment & Plan:  For your calf pain and pain behind the knee will rx norco.  Wear compression stocking or ace wrap to support  calf.  Based on your dvt history and swelling of the leg will get lower ext doppler.  Work excuse. May refer you to sports medicine to shorten recovery time.   Follow up in 7 days or as needed  Reviewed [place] results and notified pt. Will refer to sports med.

## 2015-10-30 ENCOUNTER — Ambulatory Visit: Payer: BLUE CROSS/BLUE SHIELD | Admitting: Family Medicine

## 2015-10-30 ENCOUNTER — Ambulatory Visit (INDEPENDENT_AMBULATORY_CARE_PROVIDER_SITE_OTHER): Payer: BLUE CROSS/BLUE SHIELD | Admitting: *Deleted

## 2015-10-30 DIAGNOSIS — Z5181 Encounter for therapeutic drug level monitoring: Secondary | ICD-10-CM | POA: Diagnosis not present

## 2015-10-30 DIAGNOSIS — Z7901 Long term (current) use of anticoagulants: Secondary | ICD-10-CM

## 2015-10-30 LAB — POCT INR: INR: 3.5

## 2015-10-30 MED ORDER — WARFARIN SODIUM 10 MG PO TABS: 5.0000 mg | ORAL_TABLET | Freq: Every day | ORAL | Status: DC

## 2015-10-31 NOTE — Telephone Encounter (Signed)
Hold warfarin 3 days prior to dental cleaning/extraction if necessary.

## 2015-11-02 NOTE — Telephone Encounter (Signed)
I spoke with Janett Billow at Dr Durenda Age office and made her aware that the pt can hold warfarin 3 days prior to cleaning/extraction.  I will fax this note to [phone].

## 2015-12-01 ENCOUNTER — Ambulatory Visit (INDEPENDENT_AMBULATORY_CARE_PROVIDER_SITE_OTHER): Payer: BLUE CROSS/BLUE SHIELD | Admitting: Pharmacist

## 2015-12-01 DIAGNOSIS — Z5181 Encounter for therapeutic drug level monitoring: Secondary | ICD-10-CM | POA: Diagnosis not present

## 2015-12-01 DIAGNOSIS — Z7901 Long term (current) use of anticoagulants: Secondary | ICD-10-CM | POA: Diagnosis not present

## 2015-12-01 LAB — POCT INR: INR: 3.6

## 2015-12-01 MED ORDER — WARFARIN SODIUM 10 MG PO TABS: 5.0000 mg | ORAL_TABLET | Freq: Every day | ORAL | Status: DC

## 2015-12-24 ENCOUNTER — Other Ambulatory Visit: Payer: Self-pay | Admitting: Cardiovascular Disease

## 2016-01-13 ENCOUNTER — Ambulatory Visit (INDEPENDENT_AMBULATORY_CARE_PROVIDER_SITE_OTHER): Payer: BLUE CROSS/BLUE SHIELD | Admitting: Pharmacist

## 2016-01-13 DIAGNOSIS — Z7901 Long term (current) use of anticoagulants: Secondary | ICD-10-CM | POA: Diagnosis not present

## 2016-01-13 DIAGNOSIS — Z5181 Encounter for therapeutic drug level monitoring: Secondary | ICD-10-CM

## 2016-01-13 LAB — POCT INR: INR: 2.9

## 2016-01-13 MED ORDER — WARFARIN SODIUM 10 MG PO TABS: 5.0000 mg | ORAL_TABLET | Freq: Every day | ORAL | Status: DC

## 2016-01-21 ENCOUNTER — Encounter: Payer: Self-pay | Admitting: Cardiovascular Disease

## 2016-01-21 ENCOUNTER — Ambulatory Visit (INDEPENDENT_AMBULATORY_CARE_PROVIDER_SITE_OTHER): Payer: BLUE CROSS/BLUE SHIELD | Admitting: Cardiovascular Disease

## 2016-01-21 VITALS — BP 116/80 | HR 95 | Ht 73.0 in | Wt 204.0 lb

## 2016-01-21 DIAGNOSIS — E785 Hyperlipidemia, unspecified: Secondary | ICD-10-CM | POA: Diagnosis not present

## 2016-01-21 DIAGNOSIS — I10 Essential (primary) hypertension: Secondary | ICD-10-CM

## 2016-01-21 MED ORDER — LISINOPRIL 10 MG PO TABS: 10.0000 mg | ORAL_TABLET | Freq: Every day | ORAL | Status: DC

## 2016-01-21 NOTE — Patient Instructions (Signed)
Medication Instructions:  Your physician recommends that you continue on your current medications as directed. Please refer to the Current Medication list given to you today.  Labwork: Your physician recommends that you return for a FASTING LIPID, CBC and CMP in 1 YEAR--nothing to eat or drink after midnight, lab opens at 7:30 AM  Testing/Procedures: No new orders.   Follow-Up: Your physician wants you to follow-up in: 1 YEAR with Dr Burt Knack. You will receive a reminder letter in the mail two months in advance. If you don't receive a letter, please call our office to schedule the follow-up appointment.   Any Other Special Instructions Will Be Listed Below (If Applicable).     If you need a refill on your cardiac medications before your next appointment, please call your pharmacy.

## 2016-01-21 NOTE — Progress Notes (Signed)
Cardiology Office Note Date:  01/21/2016   ID:  [name], DOB 1960/08/13, MRN [id number]  PCP:  Kathlene November, MD  Cardiologist:  Sherren Mocha, MD    Chief Complaint  Patient presents with  . Hypertension  . Hyperlipidemia     History of Present Illness: [name] is a 55 y.o. male who presents for follow-up evaluation. He has lower extremity occlusive arterial disease and is maintained on chronic warfarin. He underwent embolectomy of the left femoral and popliteal arteries in 2008. He's also had a renal infarction. He has a underlying hypercoagulable state and is treated with long-term warfarin. He's undergone cardiac catheterization in 2012 for chronic chest pain and was found to have widely patent coronary arteries. He's been noncompliant with recommendations for lifestyle modification and has continued to smoke cigarettes and drink alcohol over the last several years.  The patient is back to work as a Administrator. He says that he's been smoking more now about 1-1/2 packs per day. He continues to drink alcohol. He's had a difficult night is his good friend of many years passed away from cancer. The patient has not had any exertional chest pain or pressure. He does have chest wall pain when he does certain movements and this is a fairly chronic problem. He denies shortness of breath. He has some leg swelling on the left when he drives for many hours. No orthopnea, PND, or heart palpitations.   Past Medical History  Diagnosis Date  . PAD (peripheral artery disease) ([place])     a. left leg ischemia tx wtih embolectomy of left fem, pop, tib arteries with Dr. Scot Dock - 08/2006;  b. known occlusion of right pop with collats - med Rx;  c.ABI's 8/09: R 1.0; L 0.91   . Renal infarct ([place])     R  . History of DVT of lower extremity     on chronic coumadin  . Chest pain     stres test neg x 2, cath 5/12; minimal LAD irregs; no obs CAD; normal LVF  . Internal hemorrhoids    Cscope 2007  . Polysubstance abuse     Past Surgical History  Procedure Laterality Date  . Cholecystectomy    . Left leg blood clot removal 2009  2009  . Tonsillectomy    . Wrist surgery      Current Outpatient Prescriptions  Medication Sig Dispense Refill  . Aspirin-Salicylamide-Caffeine (BC HEADACHE POWDER PO) Take 1 packet by mouth every 8 (eight) hours as needed (pain).    [name] HYDROcodone-acetaminophen (NORCO) 5-325 MG tablet Take 1 tablet by mouth every 6 (six) hours as needed for moderate pain. 20 tablet 0  . lisinopril (PRINIVIL,ZESTRIL) 10 MG tablet Take 1 tablet (10 mg total) by mouth daily. 30 tablet 11  . warfarin (COUMADIN) 10 MG tablet Take 0.5-1 tablets (5-10 mg total) by mouth daily. 6m daily except 562mon Wednesdays 35 tablet 1   No current facility-administered medications for this visit.    Allergies:   Pravastatin and Simvastatin   Social History:  The patient  reports that he has been smoking Cigarettes.  He has been smoking about 0.00 packs per day for the past 30 years. He has never used smokeless tobacco. He reports that he drinks alcohol. He reports that he uses illicit drugs (Marijuana).   Family History:  The patient's  family history includes CAD in his father; Hypertension in his father and mother; Lung cancer in his maternal uncle; Pancreatic  cancer in his brother.    ROS:  Please see the history of present illness.  Otherwise, review of systems is positive for Nausea/vomiting.  All other systems are reviewed and negative.    PHYSICAL EXAM: VS:  BP 116/80 mmHg  Pulse 95  Ht 6' 1" (1.854 m)  Wt 204 lb (92.534 kg)  BMI 26.92 kg/m2 , BMI Body mass index is 26.92 kg/(m^2). GEN: Well nourished, well developed, in no acute distress HEENT: normal Neck: no JVD, no masses. No carotid bruits Cardiac: RRR without murmur or gallop                Respiratory:  clear to auscultation bilaterally, normal work of breathing GI: soft, nontender, nondistended, +  BS MS: no deformity or atrophy Ext: no pretibial edema, pedal pulses non-palpable Skin: warm and dry, no rash Neuro:  Strength and sensation are intact Psych: euthymic mood, full affect  EKG:  EKG is ordered today. The ekg ordered today shows normal sinus rhythm 95 bpm, possible age-indeterminate inferior infarct, otherwise within normal limits.  Recent Labs: 02/04/2015: ALT 28 07/12/2015: BUN 12; Creatinine, Ser 1.20; Hemoglobin 14.6; Potassium 3.5; Sodium 142   Lipid Panel     Component Value Date/Time   CHOL 216* 04/30/2015 0815   TRIG 116.0 04/30/2015 0815   HDL 51.80 04/30/2015 0815   CHOLHDL 4 04/30/2015 0815   VLDL 23.2 04/30/2015 0815   LDLCALC 141* 04/30/2015 0815   LDLDIRECT 143.7 11/24/2008 1235      Wt Readings from Last 3 Encounters:  01/21/16 204 lb (92.534 kg)  10/28/15 213 lb (96.616 kg)  07/13/15 212 lb (96.163 kg)     Cardiac Studies Reviewed: Myoview stress scan 01/15/2014: QPS Raw Data Images: Patient motion noted. Stress Images: There is decreased uptake in the anterior wall. Rest Images: There is decreased uptake in the anterior wall. Subtraction (SDS): These findings are consistent with ischemia. Transient Ischemic Dilatation (Normal <1.22): 0.97 Lung/Heart Ratio (Normal <0.45): 0.25  Quantitative Gated Spect Images QGS EDV: 114 ml QGS ESV: 50 ml  Impression Exercise Capacity: Lexiscan with no exercise. BP Response: Normal blood pressure response. Clinical Symptoms: There is dyspnea. ECG Impression: No significant ST segment change suggestive of ischemia. Comparison with Prior Nuclear Study: No images to compare  Overall Impression: Low risk stress nuclear study Thinning of the inferior wall not thought to be significant But small area of apical ischemia with SDS 6.  LV Ejection Fraction: 51%. LV Wall Motion: Low normal EF No discrete RWMA  ASSESSMENT AND PLAN: 1.  Lower extremity peripheral arterial disease: The patient  denies claudication symptoms. We had a lengthy discussion about his tobacco use today and he understands the impact on his PAD. He remains on warfarin. No bleeding problems reported.  2. Chest pain: This is clearly chest wall pain with a positional component. He has no exertional pain or pressure.  3. Hypercoagulable disorder, continue chronic warfarin.  4. Hyperlipidemia: The patient is statin intolerant/allergic. Will repeat his lipids and LFTs when he returns. He has not been motivated to take any lipid-lowering therapies in the past.  5. Tobacco and alcohol use: Cessation counseling done today.  Current medicines are reviewed with the patient today.  The patient does not have concerns regarding medicines.  Labs/ tests ordered today include:   Orders Placed This Encounter  Procedures  . CBC  . Lipid panel  . Comp Met (CMET)  . EKG 12-Lead    Disposition:   FU one year with  lab work prior to his RTN visit  Signed, Sherren Mocha, MD  01/21/2016 1:19 PM    [address], [place], [place]  [address] Phone: [phone]; Fax: [phone]

## 2016-02-17 ENCOUNTER — Ambulatory Visit (INDEPENDENT_AMBULATORY_CARE_PROVIDER_SITE_OTHER): Payer: BLUE CROSS/BLUE SHIELD | Admitting: Surgery

## 2016-02-17 DIAGNOSIS — Z7901 Long term (current) use of anticoagulants: Secondary | ICD-10-CM

## 2016-02-17 DIAGNOSIS — Z5181 Encounter for therapeutic drug level monitoring: Secondary | ICD-10-CM

## 2016-02-17 LAB — POCT INR: INR: 1.6

## 2016-04-05 ENCOUNTER — Ambulatory Visit (INDEPENDENT_AMBULATORY_CARE_PROVIDER_SITE_OTHER): Payer: BLUE CROSS/BLUE SHIELD

## 2016-04-05 DIAGNOSIS — Z5181 Encounter for therapeutic drug level monitoring: Secondary | ICD-10-CM

## 2016-04-05 DIAGNOSIS — I10 Essential (primary) hypertension: Secondary | ICD-10-CM | POA: Diagnosis not present

## 2016-04-05 DIAGNOSIS — Z7901 Long term (current) use of anticoagulants: Secondary | ICD-10-CM

## 2016-04-05 DIAGNOSIS — E785 Hyperlipidemia, unspecified: Secondary | ICD-10-CM | POA: Diagnosis not present

## 2016-04-05 LAB — POCT INR: INR: 3.8

## 2016-04-05 MED ORDER — LISINOPRIL 10 MG PO TABS: 10.0000 mg | ORAL_TABLET | Freq: Every day | ORAL | 11 refills | Status: DC

## 2016-04-05 MED ORDER — WARFARIN SODIUM 10 MG PO TABS: ORAL_TABLET | ORAL | 1 refills | Status: DC

## 2016-05-10 ENCOUNTER — Ambulatory Visit (INDEPENDENT_AMBULATORY_CARE_PROVIDER_SITE_OTHER): Payer: BLUE CROSS/BLUE SHIELD | Admitting: *Deleted

## 2016-05-10 DIAGNOSIS — Z5181 Encounter for therapeutic drug level monitoring: Secondary | ICD-10-CM | POA: Diagnosis not present

## 2016-05-10 DIAGNOSIS — Z7901 Long term (current) use of anticoagulants: Secondary | ICD-10-CM

## 2016-05-10 LAB — POCT INR: INR: 4.4

## 2016-06-29 ENCOUNTER — Other Ambulatory Visit: Payer: Self-pay | Admitting: *Deleted

## 2016-06-29 MED ORDER — WARFARIN SODIUM 10 MG PO TABS: ORAL_TABLET | ORAL | 0 refills | Status: DC

## 2016-07-11 ENCOUNTER — Ambulatory Visit (INDEPENDENT_AMBULATORY_CARE_PROVIDER_SITE_OTHER): Payer: BLUE CROSS/BLUE SHIELD | Admitting: *Deleted

## 2016-07-11 DIAGNOSIS — Z5181 Encounter for therapeutic drug level monitoring: Secondary | ICD-10-CM

## 2016-07-11 DIAGNOSIS — Z7901 Long term (current) use of anticoagulants: Secondary | ICD-10-CM | POA: Diagnosis not present

## 2016-07-11 LAB — POCT INR: INR: 2.6

## 2016-07-11 MED ORDER — WARFARIN SODIUM 10 MG PO TABS: ORAL_TABLET | ORAL | 2 refills | Status: DC

## 2016-07-25 ENCOUNTER — Ambulatory Visit (INDEPENDENT_AMBULATORY_CARE_PROVIDER_SITE_OTHER): Payer: BLUE CROSS/BLUE SHIELD | Admitting: Internal Medicine

## 2016-07-25 ENCOUNTER — Encounter: Payer: Self-pay | Admitting: Internal Medicine

## 2016-07-25 VITALS — BP 118/78 | HR 79 | Temp 98.9°F | Resp 14 | Ht 73.0 in | Wt 205.0 lb

## 2016-07-25 DIAGNOSIS — Z0000 Encounter for general adult medical examination without abnormal findings: Secondary | ICD-10-CM | POA: Diagnosis not present

## 2016-07-25 DIAGNOSIS — Z1159 Encounter for screening for other viral diseases: Secondary | ICD-10-CM

## 2016-07-25 DIAGNOSIS — I10 Essential (primary) hypertension: Secondary | ICD-10-CM | POA: Diagnosis not present

## 2016-07-25 DIAGNOSIS — K219 Gastro-esophageal reflux disease without esophagitis: Secondary | ICD-10-CM

## 2016-07-25 DIAGNOSIS — Z23 Encounter for immunization: Secondary | ICD-10-CM | POA: Diagnosis not present

## 2016-07-25 DIAGNOSIS — R110 Nausea: Secondary | ICD-10-CM

## 2016-07-25 DIAGNOSIS — E785 Hyperlipidemia, unspecified: Secondary | ICD-10-CM

## 2016-07-25 DIAGNOSIS — Z1211 Encounter for screening for malignant neoplasm of colon: Secondary | ICD-10-CM

## 2016-07-25 LAB — PSA: PSA: 0.9 ng/mL (ref <=4.0)

## 2016-07-25 LAB — HEPATITIS C ANTIBODY: HCV Ab: NEGATIVE

## 2016-07-25 MED ORDER — ESOMEPRAZOLE MAGNESIUM 40 MG PO CPDR: 40.0000 mg | DELAYED_RELEASE_CAPSULE | Freq: Every day | ORAL | 5 refills | Status: DC

## 2016-07-25 NOTE — Progress Notes (Signed)
Subjective:    Patient ID: [name], male    DOB: [birth year], 55 y.o.   MRN: [id number]  DOS:  07/25/2016 Type of visit - description : CPX Interval history: Here for a CPX, last office visit 2015. In addition to the CPX I reviewed the chart and we address chronic and acute problems. Chest pain: Workup reviewed, last stress test negative Peripheral vascular disease: Follow-up by cardiology, asymptomatic. High cholesterol: Statin intolerant   Review of Systems  Constitutional: No fever. No chills. No unexplained wt changes. No unusual sweats  HEENT: No dental problems, no ear discharge, no facial swelling, no voice changes. No eye discharge, no eye  redness , no  intolerance to light   Respiratory:  Chronic cough, mostly when she sleeps. No sputum production or wheezing    Cardiovascular: Still has occasional chest pain, a chronic issue. No leg swelling , no  Palpitations  GI:  Reports long history of nausea, occasional vomiting, + loose stools. Typically he goes to sleep at 7 AM (works third shift) after he has several drinks, shortly after wakes up with nausea and some vomiting. Denies hematemesis, no  recent weight loss. No blood in the stools. Also has chronic GERD, burning at the chest and a feeling of regurgitation. Re started Nexium about 2 months ago and is feeling some better. No dysphagia, no odynophagia    Endocrine: No polyphagia, no polyuria , no polydipsia  GU: No dysuria, gross hematuria, difficulty urinating. No urinary urgency, no frequency.  Musculoskeletal: No joint swellings or unusual aches or pains  Skin: No change in the color of the skin, palor , no  Rash  Allergic, immunologic: No environmental allergies , no  food allergies  Neurological: No dizziness no  syncope. No headaches. No diplopia, no slurred, no slurred speech, no motor deficits, no facial  Numbness  Hematological: No enlarged lymph nodes, no easy bruising , no unusual  bleedings  Psychiatry: No suicidal ideas, no hallucinations, no beavior problems, no confusion.  No unusual/severe anxiety, no depression  Past Medical History:  Diagnosis Date  . Chest pain    stres test neg x 2, cath 5/12; minimal LAD irregs; no obs CAD; normal LVF  . History of DVT of lower extremity    on chronic coumadin  . Internal hemorrhoids    Cscope 2007  . PAD (peripheral artery disease) ([place])    a. left leg ischemia tx wtih embolectomy of left fem, pop, tib arteries with Dr. Scot Dock - 08/2006;  b. known occlusion of right pop with collats - med Rx;  c.ABI's 8/09: R 1.0; L 0.91   . Polysubstance abuse   . Renal infarct [facility])    R    Past Surgical History:  Procedure Laterality Date  . CHOLECYSTECTOMY    . left leg blood clot removal 2009  2009  . TONSILLECTOMY    . WRIST SURGERY      Social History   Social History  . Marital status: Married    Spouse name: N/A  . Number of children: 2  . Years of education: N/A   Occupational History  . [employer], driver     Social History Main Topics  . Smoking status: Current Every Day Smoker    Packs/day: 0.00    Years: 30.00    Types: Cigarettes  . Smokeless tobacco: Never Used     Comment: 1 to 1.5 ppd   . Alcohol use Yes     Comment: ETOH most  days ," 2-3 beers , couple of shots"  . Drug use:     Types: Marijuana  . Sexual activity: Not on file   Other Topics Concern  . Not on file   Social History Narrative   1 child lives w/ them     Family History  Problem Relation Age of Onset  . Hypertension Mother   . Breast cancer Mother   . Hypertension Father   . CAD Father   . Diabetes Father   . Lung cancer Maternal Uncle   . Pancreatic cancer Brother   . Prostate cancer Maternal Uncle   . Colon cancer Neg Hx      Allergies as of 07/25/2016      Reactions   Pravastatin Itching   Simvastatin Itching      Medication List       Accurate as of 07/25/16 11:59 PM. Always use your most recent med  list.          esomeprazole 40 MG capsule Commonly known as:  NEXIUM Take 1 capsule (40 mg total) by mouth daily before breakfast.   lisinopril 10 MG tablet Commonly known as:  PRINIVIL,ZESTRIL Take 1 tablet (10 mg total) by mouth daily.   warfarin 10 MG tablet Commonly known as:  COUMADIN Take as directed by [facility]          Objective:   Physical Exam BP 118/78 (BP Location: Left Arm, Patient Position: Sitting, Cuff Size: Normal)   Pulse 79   Temp 98.9 F (37.2 C) (Oral)   Resp 14   Ht 6\' 1"  (1.854 m)   Wt 205 lb (93 kg)   SpO2 98%   BMI 27.05 kg/m  General:   Well developed, well nourished . NAD.  Neck: No  thyromegaly  HEENT:  Normocephalic . Face symmetric, atraumatic Lungs:  CTA B Normal respiratory effort, no intercostal retractions, no accessory muscle use. Heart: RRR,  no murmur.  No pretibial edema bilaterally  Abdomen:  Not distended, soft, non-tender. No rebound or rigidity.  No inguinal hernias Rectal:  External abnormalities: none. Normal sphincter tone. No rectal masses or tenderness.  No stools found Prostate: Prostate gland firm and smooth, no enlargement, nodularity, tenderness, mass, asymmetry or induration.  Skin: Exposed areas without rash. Not pale. Not jaundice Neurologic:  alert & oriented X3.  Speech normal, gait appropriate for age and unassisted Strength symmetric and appropriate for age.  Psych: Cognition and judgment appear intact.  Cooperative with normal attention span and concentration.  Behavior appropriate. No anxious or depressed appearing.     Assessment & Plan:   Assessment HTN Hyperlipidemia- statin intolerant  GERD -EGD 2002 and 2015 (due to nausea) : Gastritis, duodenal inflammation., bx benign, no H. pylori Chest pain:  stres test neg x 2, cath 5/12; minimal LAD irregs; no obs CAD; normal LVF; stress test: 01/16/2014: Low risk study: DVT, leg : on chronic Coumadin (per cardiology) H/o Renal infarct  >>> R PAD:  left leg ischemia tx wtih embolectomy of left fem, pop, tib arteries with Dr. Scot Dock - 08/2006;  b. known occlusion of right pop with collats - med Rx;  c.ABI's 8/09: R 1.0; L 0.91  Polysubstance abuse Smoker   PLAN: HTN: Seems control with lisinopril Hyperlipidemia: Statin intolerant, checking labs GERD, chronic nausea, vomiting: Patient is a heavy drinker, sx are chronic , no weight loss, hematemesis or blood in the stools. Last seen by GI for similar symptoms 2015 by GI, EGD-US were ok .  Recommend alcohol abstinence, increase Nexium from 20 to 40 , reassess in 6 weeks. Cough, smoker- reassess on RTC Polysubstance abuse: Continue with excessive alcohol, tobacco and marijuana. Coumadin : Per cardiology RTC 6 weeks  Today, in addition to his complete physical exam I spent more than 20   min with the patient: >50% of the time counseling regards his chronic medical issues, including nausea, vomiting, reviewing his chart, counseling him about polysubstance abuse

## 2016-07-25 NOTE — Progress Notes (Signed)
Pre visit review using our clinic review tool, if applicable. No additional management support is needed unless otherwise documented below in the visit note. 

## 2016-07-25 NOTE — Assessment & Plan Note (Addendum)
Td 2006; PNM 23 -- 07-2016;  flu shot today CCS: Coloscopy 2002, colonoscopy again in 12/2005: Internal hemorrhoids. Refer to  GI Prostate cancer screening: DRE normal today, check a PSA. Counseled about: Diet, exercise, tobacco, etoh abuse. AA meetings Labs: CMP, FLP, CBC, TSH, PSA

## 2016-07-25 NOTE — Patient Instructions (Addendum)
GO TO THE LAB : Get the blood work     GO TO THE FRONT DESK Schedule your next appointment for a  checkup in 6 weeks   Increase Nexium 40 mg to 1 tablet  a day  Think  about quitting tobacco, EtOH.   AA meetings?

## 2016-07-26 ENCOUNTER — Telehealth: Payer: Self-pay

## 2016-07-26 DIAGNOSIS — K219 Gastro-esophageal reflux disease without esophagitis: Secondary | ICD-10-CM | POA: Insufficient documentation

## 2016-07-26 DIAGNOSIS — Z09 Encounter for follow-up examination after completed treatment for conditions other than malignant neoplasm: Secondary | ICD-10-CM | POA: Insufficient documentation

## 2016-07-26 LAB — LIPID PANEL
Cholesterol: 241 mg/dL — ABNORMAL HIGH (ref 0–200)
HDL: 51.2 mg/dL (ref >39.00)
LDL CALC: 154 mg/dL — AB (ref 0–99)
NONHDL: 189.91
Total CHOL/HDL Ratio: 5
Triglycerides: 179 mg/dL — ABNORMAL HIGH (ref 0.0–149.0)
VLDL: 35.8 mg/dL (ref 0.0–40.0)

## 2016-07-26 LAB — CBC WITH DIFFERENTIAL/PLATELET
BASOS PCT: 0.6 % (ref 0.0–3.0)
Basophils Absolute: 0 10*3/uL (ref 0.0–0.1)
EOS ABS: 0.1 10*3/uL (ref 0.0–0.7)
Eosinophils Relative: 2.3 % (ref 0.0–5.0)
HCT: 44.3 % (ref 39.0–52.0)
HEMOGLOBIN: 14.9 g/dL (ref 13.0–17.0)
LYMPHS ABS: 1.9 10*3/uL (ref 0.7–4.0)
Lymphocytes Relative: 33.5 % (ref 12.0–46.0)
MCHC: 33.6 g/dL (ref 30.0–36.0)
MCV: 93.3 fl (ref 78.0–100.0)
MONO ABS: 0.4 10*3/uL (ref 0.1–1.0)
Monocytes Relative: 8.1 % (ref 3.0–12.0)
NEUTROS PCT: 55.5 % (ref 43.0–77.0)
Neutro Abs: 3.1 10*3/uL (ref 1.4–7.7)
Platelets: 235 10*3/uL (ref 150.0–400.0)
RBC: 4.74 Mil/uL (ref 4.22–5.81)
RDW: 14.7 % (ref 11.5–15.5)
WBC: 5.6 10*3/uL (ref 4.0–10.5)

## 2016-07-26 LAB — COMPREHENSIVE METABOLIC PANEL
ALT: 22 U/L (ref 0–53)
AST: 26 U/L (ref 0–37)
Albumin: 4.2 g/dL (ref 3.5–5.2)
Alkaline Phosphatase: 79 U/L (ref 39–117)
BUN: 12 mg/dL (ref 6–23)
CHLORIDE: 104 meq/L (ref 96–112)
CO2: 27 mEq/L (ref 19–32)
Calcium: 9.6 mg/dL (ref 8.4–10.5)
Creatinine, Ser: 1.17 mg/dL (ref 0.40–1.50)
GFR: 83.72 mL/min (ref >60.00)
GLUCOSE: 68 mg/dL — AB (ref 70–99)
POTASSIUM: 4.1 meq/L (ref 3.5–5.1)
Sodium: 139 mEq/L (ref 135–145)
Total Bilirubin: 0.5 mg/dL (ref 0.2–1.2)
Total Protein: 6.8 g/dL (ref 6.0–8.3)

## 2016-07-26 LAB — TSH: TSH: 1.91 u[IU]/mL (ref 0.35–4.50)

## 2016-07-26 NOTE — Assessment & Plan Note (Signed)
HTN: Seems control with lisinopril Hyperlipidemia: Statin intolerant, checking labs GERD, chronic nausea, vomiting: Patient is a heavy drinker, sx are chronic , no weight loss, hematemesis or blood in the stools. Last seen by GI for similar symptoms 2015 by GI, EGD-US were ok . Recommend alcohol abstinence, increase Nexium from 20 to 40 , reassess in 6 weeks. Cough, smoker- reassess on RTC Polysubstance abuse: Continue with excessive alcohol, tobacco and marijuana. Coumadin : Per cardiology RTC 6 weeks

## 2016-07-26 NOTE — Telephone Encounter (Addendum)
PA initiated via Covermymeds; KEY: [id number]. Awaiting determination.

## 2016-07-28 NOTE — Telephone Encounter (Signed)
PA denied. Documentation of therapeutic failure, contraindication or intolerance of a 30 day trial of 80mg  per day of both Omeprazole and Pantoprazole must be submitted for PA to be considered for approval. Please advise. PA denial determination sent for scanning.

## 2016-07-28 NOTE — Telephone Encounter (Signed)
Ok to change to  pantoprazole 40 mg 1 po qd  Let pt know

## 2016-07-29 MED ORDER — PANTOPRAZOLE SODIUM 40 MG PO TBEC: 40.0000 mg | DELAYED_RELEASE_TABLET | Freq: Every day | ORAL | 5 refills | Status: DC

## 2016-07-29 NOTE — Telephone Encounter (Signed)
Pantoprazole 40mg  sent to [facility]. Nexium 40mg  d/c.

## 2016-08-15 ENCOUNTER — Ambulatory Visit: Payer: BLUE CROSS/BLUE SHIELD | Admitting: Nurse Practitioner

## 2016-08-15 ENCOUNTER — Encounter: Payer: Self-pay | Admitting: Gastroenterology

## 2016-08-17 ENCOUNTER — Telehealth: Payer: Self-pay | Admitting: Pharmacist

## 2016-08-17 ENCOUNTER — Ambulatory Visit (INDEPENDENT_AMBULATORY_CARE_PROVIDER_SITE_OTHER): Payer: BLUE CROSS/BLUE SHIELD | Admitting: *Deleted

## 2016-08-17 DIAGNOSIS — Z5181 Encounter for therapeutic drug level monitoring: Secondary | ICD-10-CM | POA: Diagnosis not present

## 2016-08-17 DIAGNOSIS — Z7901 Long term (current) use of anticoagulants: Secondary | ICD-10-CM | POA: Diagnosis not present

## 2016-08-17 LAB — POCT INR: INR: 3

## 2016-08-17 MED ORDER — WARFARIN SODIUM 10 MG PO TABS: ORAL_TABLET | ORAL | 2 refills | Status: DC

## 2016-08-17 NOTE — Telephone Encounter (Signed)
Patient presents to INR check today and reports that he will need to have one tooth pulled. His INR was therapeutic today and for one tooth extraction we recommend against holding doses of Coumadin. He should remain on his usual dose.   Faxed to dental office at - Dr. Wyline Beady DDS [phone].

## 2016-08-25 ENCOUNTER — Ambulatory Visit: Payer: BLUE CROSS/BLUE SHIELD | Admitting: Physician Assistant

## 2016-08-26 ENCOUNTER — Telehealth: Payer: Self-pay | Admitting: Physician Assistant

## 2016-08-26 ENCOUNTER — Ambulatory Visit: Payer: BLUE CROSS/BLUE SHIELD | Admitting: Physician Assistant

## 2016-09-09 ENCOUNTER — Telehealth: Payer: Self-pay

## 2016-09-09 ENCOUNTER — Encounter: Payer: Self-pay | Admitting: Physician Assistant

## 2016-09-09 ENCOUNTER — Ambulatory Visit (INDEPENDENT_AMBULATORY_CARE_PROVIDER_SITE_OTHER): Payer: Self-pay | Admitting: Physician Assistant

## 2016-09-09 VITALS — BP 90/68 | HR 80 | Ht 73.0 in | Wt 202.2 lb

## 2016-09-09 DIAGNOSIS — Z1211 Encounter for screening for malignant neoplasm of colon: Secondary | ICD-10-CM

## 2016-09-09 DIAGNOSIS — Z1212 Encounter for screening for malignant neoplasm of rectum: Secondary | ICD-10-CM

## 2016-09-09 DIAGNOSIS — K219 Gastro-esophageal reflux disease without esophagitis: Secondary | ICD-10-CM

## 2016-09-09 DIAGNOSIS — R1013 Epigastric pain: Secondary | ICD-10-CM

## 2016-09-09 DIAGNOSIS — Z7901 Long term (current) use of anticoagulants: Secondary | ICD-10-CM

## 2016-09-09 NOTE — Telephone Encounter (Signed)
Patient per Dr. Burt Knack to hold coumadin 5 days prior to his procedures. Patient verbalized understanding.

## 2016-09-09 NOTE — Telephone Encounter (Signed)
   [name] 06-24-1961 [id number]  Dear Dr. Burt Knack:  We have scheduled the above named patient for a(n) Endoscopy/Colonoscopy procedure. Our records show that (s)he is on anticoagulation therapy.  Please advise as to whether the patient may come of their therapy of coumadin 5 days prior to their procedure which is scheduled for 10/26/16.  Please route your response to Marlon Pel, CMA or fax response to [phone].  Sincerely,    [facility]

## 2016-09-09 NOTE — Telephone Encounter (Signed)
Ok to hold for 5 days as outlined. thanks

## 2016-09-09 NOTE — Progress Notes (Signed)
Chief Complaint: Consultation for a screening colonoscopy in a patient on chronic anticoagulation, GERD and dyspepsia  HPI: [name] is a 56 year old African-American male with a past medical history of DVT on chronic Coumadin, internal hemorrhoids, PAD, polysubstance abuse and renal infarct, who has previously followed in our clinic with Dr. Fuller Plan and presents to clinic today with a complaint of reflux and dyspepsia as well as need for a screening colonoscopy.   Per review of chart patient's last colonoscopy was performed 12/15/05 by Dr. Fuller Plan with a finding of internal hemorrhoids and otherwise normal. He was told to repeat in 10 years. Last EGD was performed 02/11/14 for a complaint of reflux, nausea and vomiting, with a finding of gastritis in the gastric antrum antrum and body, duodenal inflammation in the bulb and small hiatal hernia. The patient was told to continue his PPI.   Please recall patient also saw Dr. Fuller Plan 12/16/13 in office to discuss nighttime reflux and early morning awakening with nausea and vomiting. At that time it was recommended to have EGD as above and he was also told to discontinue his alcohol and cigarette usage as well as avoid NSAIDs.   Today, the patient presents to clinic and tells me that he has continued with intermittent nausea and vomiting which is typically in the morning or may even be 4-5 hours after eating. He tells me that he has recently switched to a night shift, so his symptoms have also shifted. He notes that when he does use his Pantoprazole 40 mg once daily his symptoms tend to be less frequent, but "I don't remember to take it all the time". Patient has also continued to smoke on a daily basis as well as drink, which he knows he should discontinue. Patient also notes intermittent reflux symptoms.   Patient also tells me today that he is aware he is due for a screening colonoscopy. He does describe a "fluttering" in his abdomen on the left side which feels  like a "twitch" of my intestines. This has occurred on several occasions over the past couple of weeks and is unrelated to eating or movement.   Patient denies fever, chills, blood in his stool, melena, weight loss, fatigue, dysphagia or abdominal pain.  Past Medical History:  Diagnosis Date  . Chest pain    stres test neg x 2, cath 5/12; minimal LAD irregs; no obs CAD; normal LVF  . History of DVT of lower extremity    on chronic coumadin  . Internal hemorrhoids    Cscope 2007  . PAD (peripheral artery disease) ([place])    a. left leg ischemia tx wtih embolectomy of left fem, pop, tib arteries with Dr. Scot Dock - 08/2006;  b. known occlusion of right pop with collats - med Rx;  c.ABI's 8/09: R 1.0; L 0.91   . Polysubstance abuse   . Renal infarct [facility])    R    Past Surgical History:  Procedure Laterality Date  . CHOLECYSTECTOMY    . left leg blood clot removal 2009  2009  . TONSILLECTOMY    . WRIST SURGERY      Current Outpatient Prescriptions  Medication Sig Dispense Refill  . amoxicillin (AMOXIL) 500 MG capsule Take 500 mg by mouth daily.    [name] omeprazole (PRILOSEC) 20 MG capsule Take 20 mg by mouth daily. Out at the moment    . pantoprazole (PROTONIX) 40 MG tablet Take 1 tablet (40 mg total) by mouth daily before breakfast. 30 tablet 5  .  ranitidine (ZANTAC) 150 MG tablet Take 150 mg by mouth as needed for heartburn. Not sure of MG    . warfarin (COUMADIN) 10 MG tablet Take as directed by [facility] 30 tablet 2   No current facility-administered medications for this visit.     Allergies as of 09/09/2016 - Review Complete 09/09/2016  Allergen Reaction Noted  . Pravastatin Itching 07/26/2012  . Simvastatin Itching 07/26/2012    Family History  Problem Relation Age of Onset  . Hypertension Mother   . Breast cancer Mother   . Hypertension Father   . CAD Father   . Diabetes Father   . Lung cancer Maternal Uncle   . Pancreatic cancer Brother   . Prostate cancer  Maternal Uncle   . Colon cancer Neg Hx     Social History   Social History  . Marital status: Married    Spouse name: N/A  . Number of children: 2  . Years of education: N/A   Occupational History  . [employer], driver     Social History Main Topics  . Smoking status: Current Every Day Smoker    Packs/day: 0.00    Years: 30.00    Types: Cigarettes  . Smokeless tobacco: Never Used     Comment: 1 to 1.5 ppd   . Alcohol use Yes     Comment: ETOH most days ," 2-3 beers , couple of shots"  . Drug use: Yes    Types: Marijuana  . Sexual activity: Not on file   Other Topics Concern  . Not on file   Social History Narrative   1 child lives w/ them    Review of Systems:    Constitutional: No weight loss, fever or chills Skin: No rash  Cardiovascular: No chest pain Respiratory: No SOB Gastrointestinal: See HPI and otherwise negative Genitourinary: No dysuria or change in urinary frequency Neurological: No headache Musculoskeletal: No new muscle or joint pain Hematologic: No bleeding  Psychiatric: No history of depression or anxiety   Physical Exam:  Vital signs: BP 90/68   Pulse 80   Ht 6\' 1"  (1.854 m)   Wt 202 lb 4 oz (91.7 kg)   BMI 26.68 kg/m   Constitutional:   Pleasant African American male appears to be in NAD, Well developed, Well nourished, alert and cooperative Head:  Normocephalic and atraumatic. Eyes:   PEERL, EOMI. No icterus. Conjunctiva pink. Ears:  Normal auditory acuity. Neck:  Supple Throat: Oral cavity and pharynx without inflammation, swelling or lesion.  Respiratory: Respirations even and unlabored. Lungs clear to auscultation bilaterally.   No wheezes, crackles, or rhonchi.  Cardiovascular: Normal S1, S2. No MRG. Regular rate and rhythm. No peripheral edema, cyanosis or pallor.  Gastrointestinal:  Soft, nondistended, mild epigastric tenderness No rebound or guarding. Normal bowel sounds. No appreciable masses or hepatomegaly. Rectal:  Not  performed.  Msk:  Symmetrical without gross deformities. Without edema, no deformity or joint abnormality.  Neurologic:  Alert and  oriented x4;  grossly normal neurologically.  Skin:   Dry and intact without significant lesions or rashes. Psychiatric: Demonstrates good judgement and reason without abnormal affect or behaviors.  RELEVANT LABS AND IMAGING: CBC    Component Value Date/Time   WBC 5.6 07/25/2016 1539   RBC 4.74 07/25/2016 1539   HGB 14.9 07/25/2016 1539   HCT 44.3 07/25/2016 1539   PLT 235.0 07/25/2016 1539   MCV 93.3 07/25/2016 1539   MCH 31.9 01/01/2014 1132   MCHC 33.6 07/25/2016 1539  RDW 14.7 07/25/2016 1539   LYMPHSABS 1.9 07/25/2016 1539   MONOABS 0.4 07/25/2016 1539   EOSABS 0.1 07/25/2016 1539   BASOSABS 0.0 07/25/2016 1539    CMP     Component Value Date/Time   NA 139 07/25/2016 1539   K 4.1 07/25/2016 1539   CL 104 07/25/2016 1539   CO2 27 07/25/2016 1539   GLUCOSE 68 (L) 07/25/2016 1539   BUN 12 07/25/2016 1539   CREATININE 1.17 07/25/2016 1539   CALCIUM 9.6 07/25/2016 1539   PROT 6.8 07/25/2016 1539   ALBUMIN 4.2 07/25/2016 1539   AST 26 07/25/2016 1539   ALT 22 07/25/2016 1539   ALKPHOS 79 07/25/2016 1539   BILITOT 0.5 07/25/2016 1539   GFRNONAA 74 (L) 01/01/2014 1132   GFRAA 86 (L) 01/01/2014 1132    Assessment: 1. Dyspepsia:Patient continues with symptoms of nausea and vomiting as well as reflux, these are worse when "I'm not taking my medicine all the time", EGD in 2015 for workup of the same showed gastritis, likely this is still the cause, though the patient does have risk factors to develop cancer including smoking and symptoms have been somewhat ongoing since last endoscopy 2. GERD: Intermittently, worse when the patient is off of his medicine, likely related to above 3. Screening colorectal cancer: Patient's last colonoscopy was in 2007 with findings of internal hemorrhoids and recommendations for repeat in 10 years, he is one-year  past due. 4. Chronic anticoagulation: With Coumadin for history of DVTs  Plan: 1. Scheduled patient for an EGD and colonoscopy in [place] with Dr. Fuller Plan. Did discuss risks, benefits, limitations and alternatives to the patient agrees to proceed. 2. Patient advised to hold his Coumadin for 5 days prior to his procedures. We will communicate with his cardiologist to ensure that holding his Coumadin is acceptable. 3. Recommend the patient use his Pantoprazole 40 mg once daily, 30-60 minutes before eating breakfast in the morning on a daily basis. 4. Reviewed antireflux diet and lifestyle modifications including decreasing alcohol intake and smoking 5. Patient to follow in clinic per Dr. Lynne Leader recommendations after time of procedures  Ellouise Newer, PA-C [facility] 09/09/2016, 3:31 PM  Cc: Colon Branch, MD

## 2016-09-09 NOTE — Patient Instructions (Signed)
You have been scheduled for an endoscopy and colonoscopy. Please follow the written instructions given to you at your visit today. Please pick up your prep supplies at the pharmacy within the next 1-3 days. If you use inhalers (even only as needed), please bring them with you on the day of your procedure. Your physician has requested that you go to www.startemmi.com and enter the access code given to you at your visit today. This web site gives a general overview about your procedure. However, you should still follow specific instructions given to you by our office regarding your preparation for the procedure.  Take your pantoprazole 40 mg daily 30-60 minutes before breakfast daily.

## 2016-09-09 NOTE — Progress Notes (Signed)
Reviewed and agree with initial management plan.  Woody Kronberg T. Catrell Morrone, MD FACG 

## 2016-09-15 ENCOUNTER — Ambulatory Visit (INDEPENDENT_AMBULATORY_CARE_PROVIDER_SITE_OTHER): Payer: BLUE CROSS/BLUE SHIELD | Admitting: *Deleted

## 2016-09-15 DIAGNOSIS — Z5181 Encounter for therapeutic drug level monitoring: Secondary | ICD-10-CM | POA: Diagnosis not present

## 2016-09-15 DIAGNOSIS — Z7901 Long term (current) use of anticoagulants: Secondary | ICD-10-CM | POA: Diagnosis not present

## 2016-09-15 LAB — POCT INR: INR: 3.8

## 2016-09-15 MED ORDER — WARFARIN SODIUM 10 MG PO TABS: ORAL_TABLET | ORAL | 2 refills | Status: DC

## 2016-09-21 ENCOUNTER — Telehealth: Payer: Self-pay | Admitting: Gastroenterology

## 2016-09-21 MED ORDER — NA SULFATE-K SULFATE-MG SULF 17.5-3.13-1.6 GM/177ML PO SOLN: 1.0000 | Freq: Once | ORAL | 0 refills | Status: AC

## 2016-09-21 NOTE — Telephone Encounter (Signed)
Prescription of Suprep resent to patient's pharmacy.

## 2016-10-26 ENCOUNTER — Encounter: Payer: Self-pay | Admitting: Gastroenterology

## 2016-10-26 ENCOUNTER — Ambulatory Visit (AMBULATORY_SURGERY_CENTER): Payer: BLUE CROSS/BLUE SHIELD | Admitting: Gastroenterology

## 2016-10-26 VITALS — BP 130/98 | HR 72 | Temp 98.6°F | Resp 24 | Ht 73.0 in | Wt 202.0 lb

## 2016-10-26 DIAGNOSIS — D122 Benign neoplasm of ascending colon: Secondary | ICD-10-CM

## 2016-10-26 DIAGNOSIS — K2100 Gastro-esophageal reflux disease with esophagitis, without bleeding: Secondary | ICD-10-CM

## 2016-10-26 DIAGNOSIS — R112 Nausea with vomiting, unspecified: Secondary | ICD-10-CM

## 2016-10-26 DIAGNOSIS — Z1211 Encounter for screening for malignant neoplasm of colon: Secondary | ICD-10-CM | POA: Diagnosis not present

## 2016-10-26 MED ORDER — SODIUM CHLORIDE 0.9 % IV SOLN: 500.0000 mL | INTRAVENOUS | Status: DC

## 2016-10-26 MED ORDER — PANTOPRAZOLE SODIUM 40 MG PO TBEC: 40.0000 mg | DELAYED_RELEASE_TABLET | Freq: Every day | ORAL | 3 refills | Status: DC

## 2016-10-26 NOTE — Op Note (Signed)
[facility] Patient Name: [name] Procedure Date: 10/26/2016 3:13 PM MRN: [id number] Endoscopist: Ladene Artist , MD Age: 56 Referring MD:  Date of Birth: May 22, 1961 Gender: Male Account #: [id number] Procedure:                Upper GI endoscopy Indications:              Gastro-esophageal reflux disease, Nausea with                            vomiting Medicines:                Monitored Anesthesia Care Procedure:                Pre-Anesthesia Assessment:                           - Prior to the procedure, a History and Physical                            was performed, and patient medications and                            allergies were reviewed. The patient's tolerance of                            previous anesthesia was also reviewed. The risks                            and benefits of the procedure and the sedation                            options and risks were discussed with the patient.                            All questions were answered, and informed consent                            was obtained. Prior Anticoagulants: The patient has                            taken Coumadin (warfarin), last dose was 6 days                            prior to procedure. ASA Grade Assessment: III - A                            patient with severe systemic disease. After                            reviewing the risks and benefits, the patient was                            deemed in satisfactory condition to undergo the  procedure.                           After obtaining informed consent, the endoscope was                            passed under direct vision. Throughout the                            procedure, the patient's blood pressure, pulse, and                            oxygen saturations were monitored continuously. The                            Endoscope was introduced through the mouth, and   advanced to the second part of duodenum. The upper                            GI endoscopy was accomplished without difficulty.                            The patient tolerated the procedure well. Scope In: Scope Out: Findings:                 LA Grade A (one or more mucosal breaks less than 5                            mm, not extending between tops of 2 mucosal folds)                            esophagitis with no bleeding was found in the                            distal esophagus.                           The exam of the esophagus was otherwise normal.                           A small hiatal hernia was present.                           The exam of the stomach was otherwise normal.                           The duodenal bulb and second portion of the                            duodenum were normal. Complications:            No immediate complications. Estimated Blood Loss:     Estimated blood loss: none. Impression:               - LA Grade A reflux esophagitis.                           -  Small hiatal hernia.                           - Normal duodenal bulb and second portion of the                            duodenum.                           - No specimens collected. Recommendation:           - Patient has a contact number available for                            emergencies. The signs and symptoms of potential                            delayed complications were discussed with the                            patient. Return to normal activities tomorrow.                            Written discharge instructions were provided to the                            patient.                           - Resume previous diet. Follow antireflux measures.                           - Continue present medications. Take Protonix qam,                            every day.                           - Resume Coumadin (warfarin) at prior dose in 2                            days. Refer to  managing physician for further                            adjustment of therapy. Ladene Artist, MD 10/26/2016 3:48:22 PM This report has been signed electronically.

## 2016-10-26 NOTE — Progress Notes (Signed)
Called to room to assist during endoscopic procedure.  Patient ID and intended procedure confirmed with present staff. Received instructions for my participation in the procedure from the performing physician.  

## 2016-10-26 NOTE — Progress Notes (Signed)
To PACU  Pt awake and Alert. Report to RN 

## 2016-10-26 NOTE — Op Note (Signed)
[facility] Patient Name: [name] Procedure Date: 10/26/2016 3:14 PM MRN: [id number] Endoscopist: Ladene Artist , MD Age: 56 Referring MD:  Date of Birth: [birth year] Gender: Male Account #: [id number] Procedure:                Colonoscopy Indications:              Screening for colorectal malignant neoplasm, This                            is the patient's first colonoscopy Medicines:                Monitored Anesthesia Care Procedure:                Pre-Anesthesia Assessment:                           - Prior to the procedure, a History and Physical                            was performed, and patient medications and                            allergies were reviewed. The patient's tolerance of                            previous anesthesia was also reviewed. The risks                            and benefits of the procedure and the sedation                            options and risks were discussed with the patient.                            All questions were answered, and informed consent                            was obtained. Prior Anticoagulants: The patient has                            taken Coumadin (warfarin), last dose was 6 days                            prior to procedure. ASA Grade Assessment: III - A                            patient with severe systemic disease. After                            reviewing the risks and benefits, the patient was                            deemed in satisfactory condition to undergo the  procedure.                           After obtaining informed consent, the colonoscope                            was passed under direct vision. Throughout the                            procedure, the patient's blood pressure, pulse, and                            oxygen saturations were monitored continuously. The                            Model PCF-[id number]) scope was introduced                       through the anus and advanced to the the cecum,                            identified by appendiceal orifice and ileocecal                            valve. The ileocecal valve, appendiceal orifice,                            and rectum were photographed. The quality of the                            bowel preparation was excellent. The colonoscopy                            was performed without difficulty. The patient                            tolerated the procedure well. Scope In: 3:20:52 PM Scope Out: 3:36:36 PM Scope Withdrawal Time: 0 hours 12 minutes 46 seconds  Total Procedure Duration: 0 hours 15 minutes 44 seconds  Findings:                 The perianal and digital rectal examinations were                            normal.                           Two sessile polyps were found in the ascending                            colon. The polyps were 10 to 20 mm in size. These                            polyps were removed with a hot snare. Resection and  retrieval were complete.                           Internal hemorrhoids were found during                            retroflexion. The hemorrhoids were small and Grade                            I (internal hemorrhoids that do not prolapse).                           The exam was otherwise without abnormality on                            direct and retroflexion views.                           A few small-mouthed diverticula were found in the                            sigmoid colon. Complications:            No immediate complications. Estimated blood loss:                            None. Estimated Blood Loss:     Estimated blood loss: none. Impression:               - Two 10 to 20 mm polyps in the ascending colon,                            removed with a hot snare. Resected and retrieved.                           - Mild sigmoid colon diverticulosis.                           -  Internal hemorrhoids.                           - The examination was otherwise normal on direct                            and retroflexion views. Recommendation:           - Repeat colonoscopy in 3 years for surveillance                            pending pathology review.                           - Resume Coumadin (warfarin) in 2 days at prior                            dose. Refer to managing physician for further  adjustment of therapy.                           - Patient has a contact number available for                            emergencies. The signs and symptoms of potential                            delayed complications were discussed with the                            patient. Return to normal activities tomorrow.                            Written discharge instructions were provided to the                            patient.                           - Resume previous diet.                           - Continue present medications.                           - Await pathology results.                           - No aspirin, ibuprofen, naproxen, or other                            non-steroidal anti-inflammatory drugs for 2 weeks                            after polyp removal. Ladene Artist, MD 10/26/2016 3:45:19 PM This report has been signed electronically.

## 2016-10-26 NOTE — Patient Instructions (Signed)
Handout given on polyps and hiatal hernia  YOU HAD AN ENDOSCOPIC PROCEDURE TODAY: Refer to the procedure report and other information in the discharge instructions given to you for any specific questions about what was found during the examination. If this information does not answer your questions, please call [place] office at [phone] to clarify.   YOU SHOULD EXPECT: Some feelings of bloating in the abdomen. Passage of more gas than usual. Walking can help get rid of the air that was put into your GI tract during the procedure and reduce the bloating. If you had a lower endoscopy (such as a colonoscopy or flexible sigmoidoscopy) you may notice spotting of blood in your stool or on the toilet paper. Some abdominal soreness may be present for a day or two, also.  DIET: Your first meal following the procedure should be a light meal and then it is ok to progress to your normal diet. A half-sandwich or bowl of soup is an example of a good first meal. Heavy or fried foods are harder to digest and may make you feel nauseous or bloated. Drink plenty of fluids but you should avoid alcoholic beverages for 24 hours. If you had a esophageal dilation, please see attached instructions for diet.    ACTIVITY: Your care partner should take you home directly after the procedure. You should plan to take it easy, moving slowly for the rest of the day. You can resume normal activity the day after the procedure however YOU SHOULD NOT DRIVE, use power tools, machinery or perform tasks that involve climbing or major physical exertion for 24 hours (because of the sedation medicines used during the test).   SYMPTOMS TO REPORT IMMEDIATELY: A gastroenterologist can be reached at any hour. Please call [phone]  for any of the following symptoms:  Following lower endoscopy (colonoscopy, flexible sigmoidoscopy) Excessive amounts of blood in the stool  Significant tenderness, worsening of abdominal pains  Swelling of the  abdomen that is new, acute  Fever of 100 or higher  Following upper endoscopy (EGD, EUS, ERCP, esophageal dilation) Vomiting of blood or coffee ground material  New, significant abdominal pain  New, significant chest pain or pain under the shoulder blades  Painful or persistently difficult swallowing  New shortness of breath  Black, tarry-looking or red, bloody stools  FOLLOW UP:  If any biopsies were taken you will be contacted by phone or by letter within the next 1-3 weeks. Call [phone]  if you have not heard about the biopsies in 3 weeks.  Please also call with any specific questions about appointments or follow up tests.

## 2016-10-27 ENCOUNTER — Telehealth: Payer: Self-pay

## 2016-10-27 DIAGNOSIS — K2100 Gastro-esophageal reflux disease with esophagitis, without bleeding: Secondary | ICD-10-CM

## 2016-10-27 MED ORDER — PANTOPRAZOLE SODIUM 40 MG PO TBEC: 40.0000 mg | DELAYED_RELEASE_TABLET | Freq: Every day | ORAL | 3 refills | Status: DC

## 2016-10-27 NOTE — Telephone Encounter (Signed)
  Follow up Call-  Call back number 10/26/2016 02/11/2014  Post procedure Call Back phone  # [phone]  Permission to leave phone message Yes Yes  Some recent data might be hidden     Patient questions:  Do you have a fever, pain , or abdominal swelling? No. Pain Score  0 *  Have you tolerated food without any problems? Yes.    Have you been able to return to your normal activities? Yes.    Do you have any questions about your discharge instructions: Diet   No. Medications  No. Follow up visit  No.  Do you have questions or concerns about your Care? No.  Actions: * If pain score is 4 or above: No action needed, pain <4.

## 2016-10-28 NOTE — Telephone Encounter (Signed)
Patient went to pick up prescription and it was "not there." So I added the new prescription again, and notified the pharmacy. After calling the pharmacy when they opened they had received the previous prescription request so I cancelled the second prescription.

## 2016-10-31 ENCOUNTER — Telehealth: Payer: Self-pay | Admitting: Gastroenterology

## 2016-10-31 NOTE — Telephone Encounter (Signed)
Patient reports a sore throat since his procedure, this is the back of his throat.  He thinks that he has white patch at the base of his tongue and on his uvula.  He has been doing salt water gargles and this improves the soreness for a few hours but then it returns.  Denies fever or dysphagia with food. He believes he needs antibiotics.

## 2016-10-31 NOTE — Telephone Encounter (Signed)
Patient notified

## 2016-10-31 NOTE — Telephone Encounter (Signed)
He should see [place] or his PCP to determine the cause. Very unlikely to be procedure related.

## 2016-11-01 ENCOUNTER — Telehealth: Payer: Self-pay | Admitting: Internal Medicine

## 2016-11-01 NOTE — Telephone Encounter (Signed)
lvm advising patient of message below °

## 2016-11-01 NOTE — Telephone Encounter (Signed)
Please advise 

## 2016-11-01 NOTE — Telephone Encounter (Signed)
Please arrange an acute visit for today or tomorrow. Is severe symptoms, go to urgent care or schedule with another provider.

## 2016-11-01 NOTE — Telephone Encounter (Signed)
°  Relation to HK:FEXM Call back [phone] Pharmacy: [facility], [place] [phone] (Phone) [phone] (Fax)    Reason for call:  patient recently had endoscopy and colonoscopy, patient experiencing sore throat, tip of tounge is white and fever blister corner of mouth, patient contacted specialist for antibitoics, specialist advised patient to request from MD, please advise

## 2016-11-04 ENCOUNTER — Encounter: Payer: Self-pay | Admitting: Gastroenterology

## 2016-11-22 ENCOUNTER — Ambulatory Visit (INDEPENDENT_AMBULATORY_CARE_PROVIDER_SITE_OTHER): Payer: BLUE CROSS/BLUE SHIELD | Admitting: *Deleted

## 2016-11-22 DIAGNOSIS — Z7901 Long term (current) use of anticoagulants: Secondary | ICD-10-CM

## 2016-11-22 DIAGNOSIS — Z5181 Encounter for therapeutic drug level monitoring: Secondary | ICD-10-CM

## 2016-11-22 LAB — POCT INR: INR: 4.1

## 2016-12-01 ENCOUNTER — Ambulatory Visit (INDEPENDENT_AMBULATORY_CARE_PROVIDER_SITE_OTHER): Payer: BLUE CROSS/BLUE SHIELD | Admitting: *Deleted

## 2016-12-01 DIAGNOSIS — Z7901 Long term (current) use of anticoagulants: Secondary | ICD-10-CM

## 2016-12-01 DIAGNOSIS — Z5181 Encounter for therapeutic drug level monitoring: Secondary | ICD-10-CM | POA: Diagnosis not present

## 2016-12-01 LAB — POCT INR: INR: 3.9

## 2016-12-08 ENCOUNTER — Ambulatory Visit (INDEPENDENT_AMBULATORY_CARE_PROVIDER_SITE_OTHER): Payer: BLUE CROSS/BLUE SHIELD | Admitting: Pharmacist

## 2016-12-08 DIAGNOSIS — Z5181 Encounter for therapeutic drug level monitoring: Secondary | ICD-10-CM | POA: Diagnosis not present

## 2016-12-08 DIAGNOSIS — Z7901 Long term (current) use of anticoagulants: Secondary | ICD-10-CM | POA: Diagnosis not present

## 2016-12-08 LAB — POCT INR: INR: 3.2

## 2016-12-27 ENCOUNTER — Other Ambulatory Visit: Payer: Self-pay | Admitting: Cardiovascular Disease

## 2017-02-22 ENCOUNTER — Encounter (HOSPITAL_COMMUNITY): Payer: Self-pay | Admitting: Emergency Medicine

## 2017-02-22 ENCOUNTER — Emergency Department (HOSPITAL_COMMUNITY): Payer: BLUE CROSS/BLUE SHIELD

## 2017-02-22 ENCOUNTER — Emergency Department (HOSPITAL_COMMUNITY)
Admission: EM | Admit: 2017-02-22 | Discharge: 2017-02-22 | Disposition: A | Discharge status: Home / Self Care | Payer: BLUE CROSS/BLUE SHIELD | Attending: Emergency Medicine | Admitting: Emergency Medicine

## 2017-02-22 DIAGNOSIS — E876 Hypokalemia: Secondary | ICD-10-CM

## 2017-02-22 DIAGNOSIS — R6889 Other general symptoms and signs: Secondary | ICD-10-CM

## 2017-02-22 DIAGNOSIS — D72828 Other elevated white blood cell count: Secondary | ICD-10-CM

## 2017-02-22 DIAGNOSIS — F17210 Nicotine dependence, cigarettes, uncomplicated: Secondary | ICD-10-CM | POA: Insufficient documentation

## 2017-02-22 DIAGNOSIS — I10 Essential (primary) hypertension: Secondary | ICD-10-CM | POA: Insufficient documentation

## 2017-02-22 DIAGNOSIS — D729 Disorder of white blood cells, unspecified: Secondary | ICD-10-CM | POA: Insufficient documentation

## 2017-02-22 DIAGNOSIS — Z7901 Long term (current) use of anticoagulants: Secondary | ICD-10-CM | POA: Insufficient documentation

## 2017-02-22 DIAGNOSIS — Z720 Tobacco use: Secondary | ICD-10-CM

## 2017-02-22 DIAGNOSIS — J181 Lobar pneumonia, unspecified organism: Secondary | ICD-10-CM | POA: Insufficient documentation

## 2017-02-22 DIAGNOSIS — J189 Pneumonia, unspecified organism: Secondary | ICD-10-CM

## 2017-02-22 DIAGNOSIS — R197 Diarrhea, unspecified: Secondary | ICD-10-CM

## 2017-02-22 DIAGNOSIS — R52 Pain, unspecified: Secondary | ICD-10-CM

## 2017-02-22 DIAGNOSIS — Z79899 Other long term (current) drug therapy: Secondary | ICD-10-CM | POA: Insufficient documentation

## 2017-02-22 LAB — COMPREHENSIVE METABOLIC PANEL
ALBUMIN: 4.1 g/dL (ref 3.5–5.0)
ALT: 41 U/L (ref 17–63)
ANION GAP: 12 (ref 5–15)
AST: 31 U/L (ref 15–41)
Alkaline Phosphatase: 90 U/L (ref 38–126)
BILIRUBIN TOTAL: 0.9 mg/dL (ref 0.3–1.2)
BUN: 11 mg/dL (ref 6–20)
CHLORIDE: 100 mmol/L — AB (ref 101–111)
CO2: 26 mmol/L (ref 22–32)
Calcium: 9.6 mg/dL (ref 8.9–10.3)
Creatinine, Ser: 1.16 mg/dL (ref 0.61–1.24)
GFR calc Af Amer: >60 mL/min (ref >60)
Glucose, Bld: 97 mg/dL (ref 65–99)
POTASSIUM: 3.3 mmol/L — AB (ref 3.5–5.1)
Sodium: 138 mmol/L (ref 135–145)
TOTAL PROTEIN: 8 g/dL (ref 6.5–8.1)

## 2017-02-22 LAB — CBC WITH DIFFERENTIAL/PLATELET
BASOS PCT: 0 %
Basophils Absolute: 0 10*3/uL (ref 0.0–0.1)
EOS ABS: 0.2 10*3/uL (ref 0.0–0.7)
EOS PCT: 1 %
HCT: 44.6 % (ref 39.0–52.0)
Hemoglobin: 15.4 g/dL (ref 13.0–17.0)
Lymphocytes Relative: 11 %
Lymphs Abs: 1.4 10*3/uL (ref 0.7–4.0)
MCH: 32.5 pg (ref 26.0–34.0)
MCHC: 34.5 g/dL (ref 30.0–36.0)
MCV: 94.1 fL (ref 78.0–100.0)
MONO ABS: 1.1 10*3/uL — AB (ref 0.1–1.0)
Monocytes Relative: 9 %
Neutro Abs: 10 10*3/uL — ABNORMAL HIGH (ref 1.7–7.7)
Neutrophils Relative %: 79 %
PLATELETS: 245 10*3/uL (ref 150–400)
RBC: 4.74 MIL/uL (ref 4.22–5.81)
RDW: 14.5 % (ref 11.5–15.5)
WBC: 12.7 10*3/uL — ABNORMAL HIGH (ref 4.0–10.5)

## 2017-02-22 LAB — URINALYSIS, ROUTINE W REFLEX MICROSCOPIC
GLUCOSE, UA: NEGATIVE mg/dL
Hgb urine dipstick: NEGATIVE
Ketones, ur: 5 mg/dL — AB
Leukocytes, UA: NEGATIVE
NITRITE: NEGATIVE
PH: 5 (ref 5.0–8.0)
Protein, ur: 100 mg/dL — AB
Specific Gravity, Urine: 1.028 (ref 1.005–1.030)

## 2017-02-22 LAB — CG4 I-STAT (LACTIC ACID)
LACTIC ACID, VENOUS: 0.91 mmol/L (ref 0.5–1.9)
Lactic Acid, Venous: 1.22 mmol/L (ref 0.5–1.9)

## 2017-02-22 MED ORDER — RANITIDINE HCL 150 MG PO TABS: 150.0000 mg | ORAL_TABLET | Freq: Every day | ORAL | 0 refills | Status: DC

## 2017-02-22 MED ORDER — LEVOFLOXACIN 750 MG PO TABS
750.0000 mg | ORAL_TABLET | Freq: Once | ORAL | Status: AC
  Administered 2017-02-22: 750 mg via ORAL
  Filled 2017-02-22: qty 1

## 2017-02-22 MED ORDER — SODIUM CHLORIDE 0.9 % IV BOLUS (SEPSIS)
1000.0000 mL | Freq: Once | INTRAVENOUS | Status: AC
  Administered 2017-02-22: 1000 mL via INTRAVENOUS

## 2017-02-22 MED ORDER — POTASSIUM CHLORIDE CRYS ER 20 MEQ PO TBCR
40.0000 meq | EXTENDED_RELEASE_TABLET | Freq: Once | ORAL | Status: AC
  Administered 2017-02-22: 40 meq via ORAL
  Filled 2017-02-22: qty 2

## 2017-02-22 MED ORDER — LEVOFLOXACIN 750 MG PO TABS: 750.0000 mg | ORAL_TABLET | Freq: Every day | ORAL | 0 refills | Status: AC

## 2017-02-22 NOTE — ED Triage Notes (Addendum)
Pt complaint of ongoing body aches, fatigue, decrease in appetite, productive cough, fever, diarrhea, and headache onset Sunday.

## 2017-02-22 NOTE — Discharge Instructions (Signed)
Continue to stay well-hydrated. Follow the BRAT diet outlined below to help with diarrhea. Take zantac daily to help with your indigestion. Continue to alternate between Tylenol and Ibuprofen for pain or fever. Use Mucinex for cough suppression/expectoration of mucus. Use netipot and flonase to help with nasal congestion. May consider over-the-counter Benadryl or other antihistamine to decrease secretions and for help with your symptoms. Take antibiotic starting tomorrow, taking it as directed and until completed. IT'S VERY IMPORTANT THAT YOU CALL YOUR DOCTOR TODAY TO ASK ABOUT HOW TO ALTER YOUR COUMADIN DOSING WHILE YOU'RE ON THE ANTIBIOTIC; AT THE VERY LEAST, SKIP TOMORROW'S DOSE, AND THEN FOLLOW THEIR INSTRUCTIONS THEREAFTER. STOP SMOKING! Follow up with your primary care doctor in 5-7 days for recheck of ongoing symptoms. Return to emergency department for emergent changing or worsening of symptoms.

## 2017-02-22 NOTE — ED Provider Notes (Signed)
[facility] DEPT Provider Note   CSN: [id number] Arrival date & time: 02/22/17  2111     History   Chief Complaint Chief Complaint  Patient presents with  . Generalized Body Aches    HPI [name] is a 56 y.o. male with a PMHx of DVT on coumadin, PAD, HTN, R renal infarct, and other medical conditions listed below, who presents to the ED with complaints of flulike symptoms 4 days. He mentions that on Saturday night he developed body aches, decreased appetite, fatigue, 2 episodes daily of nonbloody diarrhea, and cough with yellow sputum production. He awoke on Sunday continuing to have the symptoms, and developed fevers with Tmax 101.8. He has tried ginger ale, NyQuil and DayQuil, honey and garlic T, and TheraFlu with some relief of his symptoms, no known aggravating factors. He states that at the pressure of his mother he finally came in for evaluation, however he acknowledges that today he feels better than he had in the last several days. No known sick contacts. He is a smoker. He also mentions that he has GERD and was prescribed Protonix but he lost his insurance so he has not been able to take it at some time, but he has daily episodes of nausea and vomiting in the morning since not taking Protonix. This is not a new issue, and has not worsened recently, but it's just been an ongoing issue since his GERD diagnosis.  He denies rhinorrhea, sore throat, fevers, chills, CP, SOB, wheezing, hemoptysis, abd pain, worsening N/V, constipation, obstipation, melena, hematochezia, hematemesis, hematuria, dysuria, arthralgias, numbness, tingling, focal weakness, or any other complaints at this time.    The history is provided by the patient and medical records. No language interpreter was used.  Cough  This is a new problem. The current episode started more than 2 days ago. The problem occurs constantly. The problem has not changed since onset.The cough is productive of sputum. The maximum  temperature recorded prior to his arrival was 101 to 101.9 F. The fever has been present for 3 to 4 days. Associated symptoms include myalgias. Pertinent negatives include no chest pain, no rhinorrhea, no sore throat, no shortness of breath and no wheezing. Treatments tried: nyquil/dayquil, theraflu, gingerale, and honey/garlic tea. The treatment provided mild relief. He is a smoker.    Past Medical History:  Diagnosis Date  . Chest pain    stres test neg x 2, cath 5/12; minimal LAD irregs; no obs CAD; normal LVF  . Clotting disorder ([place])   . GERD (gastroesophageal reflux disease)   . History of DVT of lower extremity    on chronic coumadin  . Hypertension   . Internal hemorrhoids    Cscope 2007  . PAD (peripheral artery disease) ([place])    a. left leg ischemia tx wtih embolectomy of left fem, pop, tib arteries with Dr. Scot Dock - 08/2006;  b. known occlusion of right pop with collats - med Rx;  c.ABI's 8/09: R 1.0; L 0.91   . Polysubstance abuse   . Renal infarct [facility])    R    Patient Active Problem List   Diagnosis Date Noted  . PCP NOTES >>>>>>>>>>>>>> 07/26/2016  . GERD (gastroesophageal reflux disease) 07/26/2016  . Annual physical exam 07/25/2016  . HTN (hypertension) 09/16/2014  . Nausea without vomiting 11/29/2013  . Encounter for therapeutic drug monitoring 09/05/2013  . Deep vein thrombosis ([place]) 10/06/2010  . Long term current use of anticoagulant 10/06/2010  . Renal infarct ([place]) 10/06/2010  .  Dyslipidemia 09/03/2009  . CHEST PAIN-UNSPECIFIED 09/03/2009  . SHOULDER PAIN, RIGHT 03/02/2009  . PERIPHERAL VASCULAR DISEASE 02/11/2009  . SKIN LESION 02/04/2008  . SUBSTANCE ABUSE, MULTIPLE 05/28/2007  . DISORDER, VASCULAR, KIDNEY 05/28/2007  . DVT, HX OF 12/25/2006    Past Surgical History:  Procedure Laterality Date  . CHOLECYSTECTOMY    . COLONOSCOPY    . left leg blood clot removal 2009  2009  . TONSILLECTOMY    . WRIST SURGERY         Home Medications     Prior to Admission medications   Medication Sig Start Date End Date Taking? Authorizing Provider  amoxicillin (AMOXIL) 500 MG capsule Take 500 mg by mouth daily.    [provider]  lisinopril (PRINIVIL,ZESTRIL) 10 MG tablet  09/16/16   [provider]  pantoprazole (PROTONIX) 40 MG tablet Take 1 tablet (40 mg total) by mouth daily. 10/27/16   Ladene Artist, MD  ranitidine (ZANTAC) 150 MG tablet Take 150 mg by mouth as needed for heartburn. Not sure of MG    [provider]  warfarin (COUMADIN) 10 MG tablet USE AS DIRECTED BY  COUMADIN  CLINIC 12/28/16   Sherren Mocha, MD    Family History Family History  Problem Relation Age of Onset  . Hypertension Mother   . Breast cancer Mother   . Hypertension Father   . CAD Father   . Diabetes Father   . Lung cancer Maternal Uncle   . Pancreatic cancer Brother   . Prostate cancer Maternal Uncle   . Heart disease Paternal Grandmother   . Colon cancer Neg Hx     Social History Social History  Substance Use Topics  . Smoking status: Current Every Day Smoker    Packs/day: 0.00    Years: 30.00    Types: Cigarettes  . Smokeless tobacco: Never Used     Comment: 1 to 1.5 ppd   . Alcohol use Yes     Comment: ETOH most days ," 2-3 beers , couple of shots"     Allergies   Pravastatin and Simvastatin   Review of Systems Review of Systems  Constitutional: Positive for appetite change, fatigue and fever.  HENT: Negative for rhinorrhea and sore throat.   Respiratory: Positive for cough. Negative for shortness of breath and wheezing.   Cardiovascular: Negative for chest pain.  Gastrointestinal: Positive for diarrhea. Negative for abdominal pain, blood in stool, constipation, nausea and vomiting (chronic daily episodes, unchanged).  Genitourinary: Negative for dysuria and hematuria.  Musculoskeletal: Positive for myalgias. Negative for arthralgias.  Skin: Negative for color change.  Allergic/Immunologic:  Negative for immunocompromised state.  Neurological: Negative for weakness and numbness.  Psychiatric/Behavioral: Negative for confusion.   All other systems reviewed and are negative for acute change except as noted in the HPI.    Physical Exam Updated Vital Signs BP (!) 121/98 (BP Location: Left Arm)   Pulse 83   Temp 98.6 F (37 C) (Oral)   Resp 16   Ht 6\' 1"  (1.854 m)   Wt 90.7 kg (200 lb)   SpO2 99%   BMI 26.39 kg/m   Physical Exam  Constitutional: He is oriented to person, place, and time. Vital signs are normal. He appears well-developed and well-nourished.  Non-toxic appearance. No distress.  Afebrile, nontoxic, NAD  HENT:  Head: Normocephalic and atraumatic.  Mouth/Throat: Oropharynx is clear and moist and mucous membranes are normal.  Eyes: Conjunctivae and EOM are normal. Right eye exhibits no  discharge. Left eye exhibits no discharge.  Neck: Normal range of motion. Neck supple.  Cardiovascular: Normal rate, regular rhythm, normal heart sounds and intact distal pulses.  Exam reveals no gallop and no friction rub.   No murmur heard. Pulmonary/Chest: Effort normal. No respiratory distress. He has no decreased breath sounds. He has no wheezes. He has rhonchi in the left lower field. He has no rales.  Mild rhonchi in LLL, no wheezing/rales, no hypoxia or increased WOB, speaking in full sentences, SpO2 99% on RA   Abdominal: Soft. Normal appearance and bowel sounds are normal. He exhibits no distension. There is no tenderness. There is no rigidity, no rebound, no guarding, no CVA tenderness, no tenderness at McBurney's point and negative Murphy's sign.  Musculoskeletal: Normal range of motion.  Neurological: He is alert and oriented to person, place, and time. He has normal strength. No sensory deficit.  Skin: Skin is warm, dry and intact. No rash noted.  Psychiatric: He has a normal mood and affect.  Nursing note and vitals reviewed.    ED Treatments / Results   Labs (all labs ordered are listed, but only abnormal results are displayed) Labs Reviewed  COMPREHENSIVE METABOLIC PANEL - Abnormal; Notable for the following:       Result Value   Potassium 3.3 (*)    Chloride 100 (*)    All other components within normal limits  CBC WITH DIFFERENTIAL/PLATELET - Abnormal; Notable for the following:    WBC 12.7 (*)    Neutro Abs 10.0 (*)    Monocytes Absolute 1.1 (*)    All other components within normal limits  URINALYSIS, ROUTINE W REFLEX MICROSCOPIC - Abnormal; Notable for the following:    Color, Urine AMBER (*)    APPearance HAZY (*)    Bilirubin Urine SMALL (*)    Ketones, ur 5 (*)    Protein, ur 100 (*)    Bacteria, UA FEW (*)    Squamous Epithelial / LPF 0-5 (*)    All other components within normal limits  I-STAT CG4 LACTIC ACID, ED  CG4 I-STAT (LACTIC ACID)  I-STAT CG4 LACTIC ACID, ED  CG4 I-STAT (LACTIC ACID)    EKG  EKG Interpretation None       Radiology Dg Chest 2 View  Result Date: 02/22/2017 CLINICAL DATA:  Cough EXAM: CHEST  2 VIEW COMPARISON:  07/13/2015 FINDINGS: Cardiac shadow is within normal limits. The lungs are well aerated bilaterally. Minimal patchy infiltrate is noted in the left lung base. No sizable effusion is seen. No bony abnormality is noted. IMPRESSION: Minimal left basilar infiltrate. Electronically Signed   By: Inez Catalina M.D.   On: 02/22/2017 09:37    Procedures Procedures (including critical care time)  Medications Ordered in ED Medications  sodium chloride 0.9 % bolus 1,000 mL (1,000 mLs Intravenous New Bag/Given 02/22/17 1355)  potassium chloride SA (K-DUR,KLOR-CON) CR tablet 40 mEq (40 mEq Oral Given 02/22/17 1355)  levofloxacin (LEVAQUIN) tablet 750 mg (750 mg Oral Given 02/22/17 1355)     Initial Impression / Assessment and Plan / ED Course  I have reviewed the triage vital signs and the nursing notes.  Pertinent labs & imaging results that were available during my care of the patient  were reviewed by me and considered in my medical decision making (see chart for details).     56 y.o. male here with cough/flu like symptoms/diarrhea x4 days. On exam, mild rhonchi in LLL, no wheezing/rales, no hypoxia or increased WOB. No  abdominal tenderness. Labs thus far: U/A with slight contamination, no definite evidence of UTI, appears dehydrated, will give fluids. Lactic WNL x2. CBC w/diff with mild neutrophilic leukocytosis. CMP with mildly low K 3.3, will orally replete now. CXR showing minimal L basilar infiltrate. Symptoms/exam/labs consistent with PNA. Will give fluids, Kdur, and start PNA treatment; advised that he'll likely need to hold coumadin tomorrow (takes it in the AM, so already took today's dose) and call his dr today to inquire about what to do while on levaquin. Will reassess after meds given. Pt declines wanting anything for pain. Will reassess shortly  3:02 PM Pt feeling much better after fluids and meds. Feels ready to go home. Will d/c home with levaquin rx, also rx for zantac to help with GERD; discussed BRAT diet, and advised OTC remedies to help with symptomatic relief. More than 10 minutes of the visit was spent discussing smoking cessation. F/up with PCP in 1wk for recheck of symptoms, but call today to ask about how to change his coumadin while on levaquin. I explained the diagnosis and have given explicit precautions to return to the ER including for any other new or worsening symptoms. The patient understands and accepts the medical plan as it's been dictated and I have answered their questions. Discharge instructions concerning home care and prescriptions have been given. The patient is STABLE and is discharged to home in good condition.    Final Clinical Impressions(s) / ED Diagnoses   Final diagnoses:  Community acquired pneumonia of left lower lobe of lung (HCC)  Flu-like symptoms  Diarrhea, unspecified type  Body aches  Neutrophilic leukocytosis   Hypokalemia  Tobacco user    New Prescriptions New Prescriptions   LEVOFLOXACIN (LEVAQUIN) 750 MG TABLET    Take 1 tablet (750 mg total) by mouth daily. X 6 days starting 02/23/17   RANITIDINE (ZANTAC) 150 MG TABLET    Take 1 tablet (150 mg total) by mouth at bedtime.     [address], [place], [place] 02/22/17 [place], [place], MD 02/23/17 [phone]

## 2017-02-24 ENCOUNTER — Ambulatory Visit (INDEPENDENT_AMBULATORY_CARE_PROVIDER_SITE_OTHER): Payer: Self-pay | Admitting: Pharmacist

## 2017-02-24 DIAGNOSIS — Z5181 Encounter for therapeutic drug level monitoring: Secondary | ICD-10-CM

## 2017-02-24 LAB — POCT INR: INR: 1.3

## 2017-03-03 ENCOUNTER — Ambulatory Visit (INDEPENDENT_AMBULATORY_CARE_PROVIDER_SITE_OTHER): Payer: Self-pay | Admitting: *Deleted

## 2017-03-03 DIAGNOSIS — Z5181 Encounter for therapeutic drug level monitoring: Secondary | ICD-10-CM

## 2017-03-03 LAB — POCT INR: INR: 1.5

## 2017-03-17 ENCOUNTER — Ambulatory Visit (INDEPENDENT_AMBULATORY_CARE_PROVIDER_SITE_OTHER): Payer: Self-pay | Admitting: *Deleted

## 2017-03-17 DIAGNOSIS — Z5181 Encounter for therapeutic drug level monitoring: Secondary | ICD-10-CM

## 2017-03-17 LAB — PROTIME-INR
INR: 5.6 — AB (ref 0.8–1.2)
Prothrombin Time: 59.6 s — ABNORMAL HIGH (ref 9.1–12.0)

## 2017-03-17 LAB — POCT INR: INR: 7.1

## 2017-04-28 ENCOUNTER — Ambulatory Visit (INDEPENDENT_AMBULATORY_CARE_PROVIDER_SITE_OTHER): Payer: Self-pay | Admitting: *Deleted

## 2017-04-28 DIAGNOSIS — I824Z9 Acute embolism and thrombosis of unspecified deep veins of unspecified distal lower extremity: Secondary | ICD-10-CM

## 2017-04-28 DIAGNOSIS — Z5181 Encounter for therapeutic drug level monitoring: Secondary | ICD-10-CM

## 2017-04-28 LAB — POCT INR: INR: 2.7

## 2017-04-28 MED ORDER — WARFARIN SODIUM 10 MG PO TABS: ORAL_TABLET | ORAL | 0 refills | Status: DC

## 2017-04-29 ENCOUNTER — Other Ambulatory Visit: Payer: Self-pay | Admitting: Cardiovascular Disease

## 2017-04-29 DIAGNOSIS — E785 Hyperlipidemia, unspecified: Secondary | ICD-10-CM

## 2017-04-29 DIAGNOSIS — I10 Essential (primary) hypertension: Secondary | ICD-10-CM

## 2017-05-26 ENCOUNTER — Ambulatory Visit (INDEPENDENT_AMBULATORY_CARE_PROVIDER_SITE_OTHER): Payer: Self-pay | Admitting: *Deleted

## 2017-05-26 DIAGNOSIS — I82492 Acute embolism and thrombosis of other specified deep vein of left lower extremity: Secondary | ICD-10-CM

## 2017-05-26 DIAGNOSIS — Z5181 Encounter for therapeutic drug level monitoring: Secondary | ICD-10-CM

## 2017-05-26 LAB — POCT INR: INR: 3

## 2017-05-26 MED ORDER — WARFARIN SODIUM 10 MG PO TABS: ORAL_TABLET | ORAL | 1 refills | Status: DC

## 2017-06-18 NOTE — Progress Notes (Signed)
Cardiology Office Note    Date:  06/20/2017   ID:  [name], DOB 10-02-1960, MRN [id number]  PCP:  Colon Branch, MD  Cardiologist:  Dr. Burt Knack   Chief Complaint: yearly follow up and medication refills   History of Present Illness:   [name] is a 56 y.o. male presented for follow up.   He has lower extremity occlusive arterial disease and is maintained on chronic warfarin. He underwent embolectomy of the left femoral and popliteal arteries in 2008. He's also had a renal infarction. He has a underlying hypercoagulable state and is treated with long-term warfarin. He's undergone cardiac catheterization in 2012 for chronic chest pain and was found to have widely patent coronary arteries. He's been noncompliant with recommendations for lifestyle modification and has continued to smoke cigarettes and drink alcohol over the last several years.  Last seen by Dr. Burt Knack 01/2016.  Patient is here for yearly follow-up.  He denies any exertional chest pain, shortness of breath or leg pain.  He has a chronic mild left lower extremity edema due to prior occlusive arterial disease.  He continues to smoke half a pack to 1 pack a day.  He denies orthopnea, PND, syncope or melena.  He has intermittent acid reflux with burning sensation.  He ran out of his oral medication except warfarin about a week ago.  Past Medical History:  Diagnosis Date  . Chest pain    stres test neg x 2, cath 5/12; minimal LAD irregs; no obs CAD; normal LVF  . Clotting disorder ([place])   . GERD (gastroesophageal reflux disease)   . History of DVT of lower extremity    on chronic coumadin  . Hypertension   . Internal hemorrhoids    Cscope 2007  . PAD (peripheral artery disease) ([place])    a. left leg ischemia tx wtih embolectomy of left fem, pop, tib arteries with Dr. Scot Dock - 08/2006;  b. known occlusion of right pop with collats - med Rx;  c.ABI's 8/09: R 1.0; L 0.91   . Polysubstance abuse ([place])   . Renal  infarct [facility])    R    Past Surgical History:  Procedure Laterality Date  . CHOLECYSTECTOMY    . COLONOSCOPY    . left leg blood clot removal 2009  2009  . TONSILLECTOMY    . WRIST SURGERY      Current Medications: Prior to Admission medications   Medication Sig Start Date End Date Taking? Authorizing Provider  lisinopril (PRINIVIL,ZESTRIL) 10 MG tablet Take 10 mg by mouth daily.  09/16/16   [provider]  lisinopril (PRINIVIL,ZESTRIL) 10 MG tablet Take 1 tablet (10 mg total) by mouth daily. Patient is overdue for an appointment.Needs to call and schedule for further refills 1st attempt 05/01/17   Sherren Mocha, MD  pantoprazole (PROTONIX) 40 MG tablet Take 1 tablet (40 mg total) by mouth daily. Patient not taking: Reported on 02/22/2017 10/27/16   Ladene Artist, MD  ranitidine (ZANTAC) 150 MG tablet Take 1 tablet (150 mg total) by mouth at bedtime. 02/22/17   Street, [place], PA-C  warfarin (COUMADIN) 10 MG tablet USE AS DIRECTED BY  COUMADIN  CLINIC 05/26/17   Sherren Mocha, MD    Allergies:   Pravastatin and Simvastatin   Social History   Socioeconomic History  . Marital status: Married    Spouse name: None  . Number of children: 2  . Years of education: None  . Highest education level:  None  Social Needs  . Financial resource strain: None  . Food insecurity - worry: None  . Food insecurity - inability: None  . Transportation needs - medical: None  . Transportation needs - non-medical: None  Occupational History  . Occupation: [employer], driver   Tobacco Use  . Smoking status: Current Every Day Smoker    Packs/day: 0.00    Years: 30.00    Pack years: 0.00    Types: Cigarettes  . Smokeless tobacco: Never Used  . Tobacco comment: 1 to 1.5 ppd   Substance and Sexual Activity  . Alcohol use: Yes    Comment: ETOH most days ," 2-3 beers , couple of shots"  . Drug use: Yes    Types: Marijuana    Comment: 10/25/16  . Sexual activity: None  Other Topics  Concern  . None  Social History Narrative   1 child lives w/ them     Family History:  The patient's family history includes Breast cancer in his mother; CAD in his father; Diabetes in his father; Heart disease in his paternal grandmother; Hypertension in his father and mother; Lung cancer in his maternal uncle; Pancreatic cancer in his brother; Prostate cancer in his maternal uncle.   ROS:   Please see the history of present illness.    ROS All other systems reviewed and are negative.   PHYSICAL EXAM:   VS:  BP (!) 148/104   Pulse 92   Ht 6\' 1"  (1.854 m)   Wt 209 lb 8 oz (95 kg)   SpO2 97%   BMI 27.64 kg/m    GEN: Well nourished, well developed, in no acute distress  HEENT: normal  Neck: no JVD, carotid bruits, or masses Cardiac: RRR; no murmurs, rubs, or gallops,no edema  Respiratory:  clear to auscultation bilaterally, normal work of breathing GI: soft, nontender, nondistended, + BS MS: no deformity or atrophy  Skin: warm and dry, no rash Neuro:  Alert and Oriented x 3, Strength and sensation are intact Psych: euthymic mood, full affect  Wt Readings from Last 3 Encounters:  06/20/17 209 lb 8 oz (95 kg)  02/22/17 200 lb (90.7 kg)  10/26/16 202 lb (91.6 kg)      Studies/Labs Reviewed:   EKG:  EKG is ordered today.  The ekg ordered today demonstrates normal sinus rhythm  Recent Labs: 07/25/2016: TSH 1.91 02/22/2017: ALT 41; BUN 11; Creatinine, Ser 1.16; Hemoglobin 15.4; Platelets 245; Potassium 3.3; Sodium 138   Lipid Panel    Component Value Date/Time   CHOL 241 (H) 07/25/2016 1539   TRIG 179.0 (H) 07/25/2016 1539   HDL 51.20 07/25/2016 1539   CHOLHDL 5 07/25/2016 1539   VLDL 35.8 07/25/2016 1539   LDLCALC 154 (H) 07/25/2016 1539   LDLDIRECT 143.7 11/24/2008 1235    Additional studies/ records that were reviewed today include:   Echocardiogram:  Cardiac Catheterization:     ASSESSMENT & PLAN:    1. Lower Extremity peripheral artery disease - s/p  embolectomy of the left femoral and popliteal arteries in 2008. Cotninue warfarin.  No leg pain.  Chronic mild left lower extremity edema.  2. Hypercoagulable disorders - on warfarin  3. HLD - 07/25/2016: Cholesterol 241; HDL 51.20; LDL Cholesterol 154; Triglycerides 179.0; VLDL 35.8  -He is not interested in statin due to prior myalgia.  4. Tobacco and alcohol use -Advised cessation.  Education given.  5.  GERD -Start Protonix 40 mg daily.    Medication Adjustments/Labs and Tests Ordered:  Current medicines are reviewed at length with the patient today.  Concerns regarding medicines are outlined above.  Medication changes, Labs and Tests ordered today are listed in the Patient Instructions below. Patient Instructions  Medication Instructions:  Your physician recommends that you continue on your current medications as directed. Please refer to the Current Medication list given to you today.   Labwork: NONE ORDERED  Testing/Procedures: NONE ORDERED  Follow-Up: Your physician wants you to follow-up in: [place] will receive a reminder letter in the mail two months in advance. If you don't receive a letter, please call our office to schedule the follow-up appointment.   Any Other Special Instructions Will Be Listed Below (If Applicable).     If you need a refill on your cardiac medications before your next appointment, please call your pharmacy.      Jarrett Soho, [place]  06/20/2017 4:13 PM    [facility] [place], [place], [place]  [address] Phone: [phone]; Fax: [phone]

## 2017-06-20 ENCOUNTER — Ambulatory Visit (INDEPENDENT_AMBULATORY_CARE_PROVIDER_SITE_OTHER): Payer: BLUE CROSS/BLUE SHIELD | Admitting: Physician Assistant

## 2017-06-20 ENCOUNTER — Encounter: Payer: Self-pay | Admitting: Physician Assistant

## 2017-06-20 ENCOUNTER — Ambulatory Visit (INDEPENDENT_AMBULATORY_CARE_PROVIDER_SITE_OTHER): Payer: BLUE CROSS/BLUE SHIELD | Admitting: *Deleted

## 2017-06-20 VITALS — BP 148/104 | HR 92 | Ht 73.0 in | Wt 209.5 lb

## 2017-06-20 DIAGNOSIS — Z5181 Encounter for therapeutic drug level monitoring: Secondary | ICD-10-CM

## 2017-06-20 DIAGNOSIS — I10 Essential (primary) hypertension: Secondary | ICD-10-CM | POA: Diagnosis not present

## 2017-06-20 DIAGNOSIS — E785 Hyperlipidemia, unspecified: Secondary | ICD-10-CM

## 2017-06-20 DIAGNOSIS — I82492 Acute embolism and thrombosis of other specified deep vein of left lower extremity: Secondary | ICD-10-CM

## 2017-06-20 DIAGNOSIS — K2100 Gastro-esophageal reflux disease with esophagitis, without bleeding: Secondary | ICD-10-CM

## 2017-06-20 LAB — POCT INR: INR: 1.3

## 2017-06-20 MED ORDER — PANTOPRAZOLE SODIUM 40 MG PO TBEC: 40.0000 mg | DELAYED_RELEASE_TABLET | Freq: Every day | ORAL | 3 refills | Status: DC

## 2017-06-20 MED ORDER — WARFARIN SODIUM 10 MG PO TABS: ORAL_TABLET | ORAL | 1 refills | Status: DC

## 2017-06-20 MED ORDER — LISINOPRIL 10 MG PO TABS: 10.0000 mg | ORAL_TABLET | Freq: Every day | ORAL | 3 refills | Status: DC

## 2017-06-20 NOTE — Patient Instructions (Signed)
Medication Instructions:  Your physician recommends that you continue on your current medications as directed. Please refer to the Current Medication list given to you today.   Labwork: NONE ORDERED  Testing/Procedures: NONE ORDERED  Follow-Up: Your physician wants you to follow-up in: 1 YEAR WITH DR. COOPER You will receive a reminder letter in the mail two months in advance. If you don't receive a letter, please call our office to schedule the follow-up appointment.   Any Other Special Instructions Will Be Listed Below (If Applicable).     If you need a refill on your cardiac medications before your next appointment, please call your pharmacy.   

## 2017-06-20 NOTE — Patient Instructions (Signed)
Today take 1.5 tablets, tomorrow take 1 then continue taking 1 tablet daily except 1/2 tablet on Wednesday. Recheck INR in one week.  [facility] [phone].

## 2017-06-27 ENCOUNTER — Ambulatory Visit (INDEPENDENT_AMBULATORY_CARE_PROVIDER_SITE_OTHER): Payer: BLUE CROSS/BLUE SHIELD | Admitting: Pharmacist

## 2017-06-27 DIAGNOSIS — Z5181 Encounter for therapeutic drug level monitoring: Secondary | ICD-10-CM

## 2017-06-27 LAB — POCT INR: INR: 3.2

## 2017-06-27 NOTE — Patient Instructions (Signed)
Skip your dose tomorrow take then continue taking 1 tablet daily except 1/2 tablet on Wednesday. Recheck INR in two weeks. [facility] [phone].

## 2017-07-12 ENCOUNTER — Ambulatory Visit (INDEPENDENT_AMBULATORY_CARE_PROVIDER_SITE_OTHER): Payer: BLUE CROSS/BLUE SHIELD | Admitting: *Deleted

## 2017-07-12 DIAGNOSIS — I824Z9 Acute embolism and thrombosis of unspecified deep veins of unspecified distal lower extremity: Secondary | ICD-10-CM

## 2017-07-12 DIAGNOSIS — Z5181 Encounter for therapeutic drug level monitoring: Secondary | ICD-10-CM

## 2017-07-12 LAB — POCT INR: INR: 3.4

## 2017-07-12 NOTE — Patient Instructions (Addendum)
Do not take tomorrow's dose then start taking 1 tablet daily except 1/2 tablet on Mondays and Wednesday. Stop self medicating. Recheck INR in two weeks. [facility] [phone].

## 2017-07-28 ENCOUNTER — Ambulatory Visit (INDEPENDENT_AMBULATORY_CARE_PROVIDER_SITE_OTHER): Payer: Self-pay | Admitting: *Deleted

## 2017-07-28 DIAGNOSIS — I824Z9 Acute embolism and thrombosis of unspecified deep veins of unspecified distal lower extremity: Secondary | ICD-10-CM

## 2017-07-28 DIAGNOSIS — Z5181 Encounter for therapeutic drug level monitoring: Secondary | ICD-10-CM

## 2017-07-28 LAB — POCT INR: INR: 1.2

## 2017-07-28 NOTE — Patient Instructions (Signed)
Description   Today take an extra 1/2 tablet and tomorrow take 1.5 tablets, then start taking 1 tablet daily except 1/2 tablet on Mondays and Wednesday. Stop self medicating. Recheck INR in two weeks. [facility] [phone].

## 2017-09-06 ENCOUNTER — Ambulatory Visit (INDEPENDENT_AMBULATORY_CARE_PROVIDER_SITE_OTHER): Payer: Self-pay | Admitting: *Deleted

## 2017-09-06 DIAGNOSIS — Z86718 Personal history of other venous thrombosis and embolism: Secondary | ICD-10-CM

## 2017-09-06 DIAGNOSIS — Z5181 Encounter for therapeutic drug level monitoring: Secondary | ICD-10-CM

## 2017-09-06 LAB — POCT INR: INR: 4.7

## 2017-09-06 MED ORDER — WARFARIN SODIUM 10 MG PO TABS: ORAL_TABLET | ORAL | 1 refills | Status: DC

## 2017-09-06 NOTE — Patient Instructions (Signed)
Description   No coumadin tomorrow Jan 31st as he has taken today then on Feb 1st no coumadin then do dose change as instructed on last visit  1 tablet daily except 1/2 tablet on Mondays and Wednesday. Stop self medicating. Recheck INR in 10 days. [facility] [phone].

## 2017-09-14 ENCOUNTER — Ambulatory Visit (INDEPENDENT_AMBULATORY_CARE_PROVIDER_SITE_OTHER): Payer: Self-pay | Admitting: *Deleted

## 2017-09-14 DIAGNOSIS — Z5181 Encounter for therapeutic drug level monitoring: Secondary | ICD-10-CM

## 2017-09-14 DIAGNOSIS — I8290 Acute embolism and thrombosis of unspecified vein: Secondary | ICD-10-CM

## 2017-09-14 LAB — POCT INR: INR: 1.8

## 2017-09-14 NOTE — Patient Instructions (Signed)
Description   Today take 1.5 tablets then continue taking 1 tablet daily except 1/2 tablet on Mondays and Wednesday. Stop self medicating. Recheck INR in 2 weeks. [facility]in Clinic [phone].

## 2017-10-17 ENCOUNTER — Encounter: Payer: Self-pay | Admitting: *Deleted

## 2017-10-17 ENCOUNTER — Ambulatory Visit (INDEPENDENT_AMBULATORY_CARE_PROVIDER_SITE_OTHER): Payer: Self-pay | Admitting: *Deleted

## 2017-10-17 DIAGNOSIS — I8290 Acute embolism and thrombosis of unspecified vein: Secondary | ICD-10-CM

## 2017-10-17 DIAGNOSIS — Z5181 Encounter for therapeutic drug level monitoring: Secondary | ICD-10-CM

## 2017-10-17 LAB — POCT INR: INR: 2.6

## 2017-10-17 NOTE — Patient Instructions (Signed)
Description   Continue taking 1 tablet daily except 1/2 tablet on Mondays and Wednesday. Stop self medicating. Recheck INR in 4 weeks. [facility]in Clinic [phone].

## 2017-11-14 ENCOUNTER — Other Ambulatory Visit: Payer: Self-pay

## 2017-11-14 ENCOUNTER — Ambulatory Visit (INDEPENDENT_AMBULATORY_CARE_PROVIDER_SITE_OTHER): Payer: Self-pay | Admitting: *Deleted

## 2017-11-14 ENCOUNTER — Encounter: Payer: Self-pay | Admitting: *Deleted

## 2017-11-14 ENCOUNTER — Emergency Department (HOSPITAL_COMMUNITY)
Admission: EM | Admit: 2017-11-14 | Discharge: 2017-11-14 | Disposition: A | Discharge status: Home / Self Care | Payer: Self-pay | Attending: Emergency Medicine | Admitting: Emergency Medicine

## 2017-11-14 ENCOUNTER — Emergency Department (HOSPITAL_COMMUNITY): Payer: Self-pay

## 2017-11-14 ENCOUNTER — Telehealth: Payer: Self-pay | Admitting: *Deleted

## 2017-11-14 ENCOUNTER — Encounter (HOSPITAL_COMMUNITY): Payer: Self-pay

## 2017-11-14 DIAGNOSIS — Z7901 Long term (current) use of anticoagulants: Secondary | ICD-10-CM | POA: Insufficient documentation

## 2017-11-14 DIAGNOSIS — Z79899 Other long term (current) drug therapy: Secondary | ICD-10-CM | POA: Insufficient documentation

## 2017-11-14 DIAGNOSIS — J181 Lobar pneumonia, unspecified organism: Secondary | ICD-10-CM | POA: Insufficient documentation

## 2017-11-14 DIAGNOSIS — Z5181 Encounter for therapeutic drug level monitoring: Secondary | ICD-10-CM

## 2017-11-14 DIAGNOSIS — F17210 Nicotine dependence, cigarettes, uncomplicated: Secondary | ICD-10-CM | POA: Insufficient documentation

## 2017-11-14 DIAGNOSIS — I10 Essential (primary) hypertension: Secondary | ICD-10-CM | POA: Insufficient documentation

## 2017-11-14 DIAGNOSIS — Z86718 Personal history of other venous thrombosis and embolism: Secondary | ICD-10-CM

## 2017-11-14 DIAGNOSIS — J189 Pneumonia, unspecified organism: Secondary | ICD-10-CM

## 2017-11-14 LAB — POCT INR: INR: 1.6

## 2017-11-14 MED ORDER — WARFARIN SODIUM 10 MG PO TABS: ORAL_TABLET | ORAL | 1 refills | Status: DC

## 2017-11-14 MED ORDER — ALBUTEROL SULFATE HFA 108 (90 BASE) MCG/ACT IN AERS
1.0000 | INHALATION_SPRAY | Freq: Once | RESPIRATORY_TRACT | Status: AC
  Administered 2017-11-14: 1 via RESPIRATORY_TRACT
  Filled 2017-11-14: qty 6.7

## 2017-11-14 MED ORDER — IPRATROPIUM-ALBUTEROL 0.5-2.5 (3) MG/3ML IN SOLN: 3.0000 mL | Freq: Once | RESPIRATORY_TRACT | Status: DC

## 2017-11-14 MED ORDER — AZITHROMYCIN 250 MG PO TABS: 250.0000 mg | ORAL_TABLET | Freq: Every day | ORAL | 0 refills | Status: DC

## 2017-11-14 MED ORDER — AMOXICILLIN 500 MG PO CAPS: 500.0000 mg | ORAL_CAPSULE | Freq: Two times a day (BID) | ORAL | 0 refills | Status: AC

## 2017-11-14 NOTE — ED Provider Notes (Signed)
[facility] EMERGENCY DEPARTMENT Provider Note   CSN: [id number] Arrival date & time: 11/14/17  9892     History   Chief Complaint No chief complaint on file.   HPI  [name] is a 57 y.o. male with history of with history of GERD, DVT on chronic Coumadin, hypertension, PAD presents for evaluation of acute onset, progressively worsening nasal congestion and cough. He states that 5 days ago he developed nausea and had 1 episode of nonbloody nonbilious emesis. He had 2 episodes of watery nonbloody diarrhea. Theese symptoms resolved that same day.he denies any abdominal pain. Since then he states he has had a cough productive of white sputum. Yesterday he developed generalized myalgias and fatigue. He denies any fevers. He denies any shortness of breath or chest pain. He does not mild nasal congestion. He notes multiple sick contacts at work with similar symptoms. He is a current smoker of approximately half a pack to one pack of cigarettes daily.he has been compliant with his Coumadin.  The history is provided by the patient.    Past Medical History:  Diagnosis Date  . Chest pain    stres test neg x 2, cath 5/12; minimal LAD irregs; no obs CAD; normal LVF  . Clotting disorder ([place])   . GERD (gastroesophageal reflux disease)   . History of DVT of lower extremity    on chronic coumadin  . Hypertension   . Internal hemorrhoids    Cscope 2007  . PAD (peripheral artery disease) ([place])    a. left leg ischemia tx wtih embolectomy of left fem, pop, tib arteries with Dr. Scot Dock - 08/2006;  b. known occlusion of right pop with collats - med Rx;  c.ABI's 8/09: R 1.0; L 0.91   . Polysubstance abuse ([place])   . Renal infarct [facility])    R    Patient Active Problem List   Diagnosis Date Noted  . PCP NOTES >>>>>>>>>>>>>> 07/26/2016  . GERD (gastroesophageal reflux disease) 07/26/2016  . Annual physical exam 07/25/2016  . HTN (hypertension) 09/16/2014  . Nausea without  vomiting 11/29/2013  . Encounter for therapeutic drug monitoring 09/05/2013  . Deep vein thrombosis ([place]) 10/06/2010  . Long term current use of anticoagulant 10/06/2010  . Renal infarct ([place]) 10/06/2010  . Dyslipidemia 09/03/2009  . CHEST PAIN-UNSPECIFIED 09/03/2009  . SHOULDER PAIN, RIGHT 03/02/2009  . PERIPHERAL VASCULAR DISEASE 02/11/2009  . SKIN LESION 02/04/2008  . SUBSTANCE ABUSE, MULTIPLE 05/28/2007  . DISORDER, VASCULAR, KIDNEY 05/28/2007  . DVT, HX OF 12/25/2006    Past Surgical History:  Procedure Laterality Date  . CHOLECYSTECTOMY    . COLONOSCOPY    . left leg blood clot removal 2009  2009  . TONSILLECTOMY    . WRIST SURGERY          Home Medications    Prior to Admission medications   Medication Sig Start Date End Date Taking? Authorizing Provider  amoxicillin (AMOXIL) 500 MG capsule Take 1 capsule (500 mg total) by mouth 2 (two) times daily for 7 days. 11/14/17 11/21/17  Rodell Perna A, PA-C  azithromycin (ZITHROMAX) 250 MG tablet Take 1 tablet (250 mg total) by mouth daily. Take first 2 tablets together, then 1 every day until finished. 11/14/17   Nils Flack, Gladies Sofranko A, PA-C  lisinopril (PRINIVIL,ZESTRIL) 10 MG tablet Take 1 tablet (10 mg total) daily by mouth. 06/20/17   Bhagat, Bhavinkumar, PA  pantoprazole (PROTONIX) 40 MG tablet Take 1 tablet (40 mg total) daily by mouth.  06/20/17   Bhagat, Crista Luria, PA  ranitidine (ZANTAC) 150 MG tablet Take 1 tablet (150 mg total) by mouth at bedtime. 02/22/17   Street, [place], PA-C  warfarin (COUMADIN) 10 MG tablet USE AS DIRECTED BY  COUMADIN  CLINIC 11/14/17   Sherren Mocha, MD    Family History Family History  Problem Relation Age of Onset  . Hypertension Mother   . Breast cancer Mother   . Hypertension Father   . CAD Father   . Diabetes Father   . Lung cancer Maternal Uncle   . Pancreatic cancer Brother   . Prostate cancer Maternal Uncle   . Heart disease Paternal Grandmother   . Colon cancer Neg Hx     Social  History Social History   Tobacco Use  . Smoking status: Current Every Day Smoker    Packs/day: 0.00    Years: 30.00    Pack years: 0.00    Types: Cigarettes  . Smokeless tobacco: Never Used  . Tobacco comment: 1 to 1.5 ppd   Substance Use Topics  . Alcohol use: Yes    Comment: ETOH most days ," 2-3 beers , couple of shots"  . Drug use: Yes    Types: Marijuana    Comment: 10/25/16     Allergies   Pravastatin and Simvastatin   Review of Systems Review of Systems  Constitutional: Positive for fatigue. Negative for chills and fever.  HENT: Positive for congestion.   Respiratory: Positive for cough. Negative for shortness of breath.   Cardiovascular: Negative for chest pain.  Gastrointestinal: Positive for diarrhea (resolved), nausea (resolved) and vomiting (resolved). Negative for abdominal pain.  Musculoskeletal: Positive for myalgias.  All other systems reviewed and are negative.    Physical Exam Updated Vital Signs BP (!) 150/95 (BP Location: Right Arm)   Pulse 66   Temp 98.6 F (37 C) (Oral)   Resp 16   SpO2 97%   Physical Exam  Constitutional: He appears well-developed and well-nourished. No distress.  HENT:  Head: Normocephalic and atraumatic.  Right Ear: Tympanic membrane, external ear and ear canal normal.  Left Ear: Tympanic membrane, external ear and ear canal normal.  Nose: Mucosal edema present. No septal deviation. Right sinus exhibits no maxillary sinus tenderness and no frontal sinus tenderness. Left sinus exhibits no maxillary sinus tenderness and no frontal sinus tenderness.  Mouth/Throat: Uvula is midline, oropharynx is clear and moist and mucous membranes are normal. No trismus in the jaw. No tonsillar exudate.  Eyes: Conjunctivae are normal. Right eye exhibits no discharge. Left eye exhibits no discharge.  Neck: No JVD present. No tracheal deviation present.  Cardiovascular: Normal rate.  Pulmonary/Chest: Effort normal. He has wheezes. He  exhibits no tenderness.  Equal rise and fall of chest, no increased work of breathing. Speaking in full sentences without difficulty. Scattered rhonchi and expiratory wheezingwith crackles auscultated in the left lung base posteriorly.  Abdominal: Soft. Bowel sounds are normal. He exhibits no distension. There is no tenderness. There is no guarding.  Musculoskeletal: He exhibits no edema.  Neurological: He is alert.  Skin: Skin is warm and dry. No erythema.  Psychiatric: He has a normal mood and affect. His behavior is normal.  Nursing note and vitals reviewed.    ED Treatments / Results  Labs (all labs ordered are listed, but only abnormal results are displayed) Labs Reviewed - No data to display  EKG None  Radiology Dg Chest 2 View  Result Date: 11/14/2017 CLINICAL DATA:  Cough and  congestion.  Body aches.  Chills. EXAM: CHEST - 2 VIEW COMPARISON:  02/22/2017. FINDINGS: Mediastinum hilar structures are normal. Mild bibasilar subsegmental atelectasis and/or scarring. Minimal left base infiltrate may be present. No pleural effusion pneumothorax. Heart size normal. Degenerative changes thoracic spine. IMPRESSION: Mild bibasilar subsegmental atelectasis and/or scarring. Minimal left base infiltrate may be present. Electronically Signed   By: Marcello Moores  Register   On: 11/14/2017 09:34    Procedures Procedures (including critical care time)  Medications Ordered in ED Medications  albuterol (PROVENTIL HFA;VENTOLIN HFA) 108 (90 Base) MCG/ACT inhaler 1 puff (1 puff Inhalation Given 11/14/17 1413)     Initial Impression / Assessment and Plan / ED Course  I have reviewed the triage vital signs and the nursing notes.  Pertinent labs & imaging results that were available during my care of the patient were reviewed by me and considered in my medical decision making (see chart for details).     Patient presents with a remote history of vomiting and diarrhea several days ago as well as  productive cough, myalgias for 2 days.  He is afebrile, vital signs are stable.  He is nontoxic in appearance.  Examination of the abdomen is entirely benign and I doubt acute surgical intra-abdominal pathology. Low suspicion of obstruction, perforation, appendicitis, colitis.  He has wheezing on auscultation of the lungs as well as crackles of the left lung base.  This corresponds to an area of left base infiltrate on chest x-ray concerning for possible community-acquired pneumonia.  He was given a breathing treatment with improvement in his symptoms.  I encouraged the patient to stay for lab work for further characterization of the severity of his symptoms but he adamantly declines stating that he would like to go home.  I think this is reasonable at this time in the absence of hypoxia, increased work of breathing, or fever.  I doubt PE given symptoms appear to be infectious in etiology.  I doubt ACS or MI in the absence of chest pain.  Will discharge with azithromycin and amoxicillin, albuterol inhaler for shortness of breath.  He will follow-up with a primary care physician for reevaluation of his symptoms.  Discussed strict ED return precautions. Pt verbalized understanding of and agreement with plan and is safe for discharge home at this time.  No complaints prior to discharge. Final Clinical Impressions(s) / ED Diagnoses   Final diagnoses:  Community acquired pneumonia of left lower lobe of lung [facility])    ED Discharge Orders        Ordered    azithromycin (ZITHROMAX) 250 MG tablet  Daily     11/14/17 1401    amoxicillin (AMOXIL) 500 MG capsule  2 times daily     11/14/17 [address] [place] A, PA-C 11/14/17 1744    Dorie Rank, MD 11/15/17 1627

## 2017-11-14 NOTE — Patient Instructions (Signed)
Description   Today April 9th take another 1/2 tablet of coumadin as he has take 1 tablet already  this morning then continue taking 1 tablet daily except 1/2 tablet on Mondays and Wednesday. Stop self medicating. Recheck INR in 2 weeks but pt states will come back in 3 weeks and is aware of possible complications in not coming back as instructed Call with any new medications any procedures or any concerns . Coumadin Clinic [phone].

## 2017-11-14 NOTE — Discharge Instructions (Signed)
Please take all of your antibiotics until finished!   You may develop abdominal discomfort or diarrhea from the antibiotic.  You may help offset this with probiotics which you can buy or get in yogurt. Do not eat  or take the probiotics until 2 hours after your antibiotic.   Call your Coumadin clinic to let them know that you are taking new medications and to make sure they monitor your INR closely.  You can use her albuterol inhaler as needed for shortness of breath. Do not use more than every 6 hours.  Drink plenty of water and get plenty of rest. He may use over-the-counter cold medications for your nasal congestion. Follow-up with your primary care physician for reevaluation of your symptoms and to make sure that your wheezing and pneumonia have cleared.  Return to the emergency Department immediately if any concerning signs or symptoms develop such as persistent shortness of breath or chest pain, high fevers, or inability to keep food down.

## 2017-11-14 NOTE — ED Triage Notes (Signed)
Patient complains of cough, congestion and body aches with chills since Saturday. Alert and oriented, NAD

## 2017-11-14 NOTE — ED Notes (Signed)
Pt complains of congestion x1 week. States he had vomiting and diarrhea a few days ago, which has since stopped. Complains of body aches and chills since 4/8. Afebrile and NAD at this time.

## 2017-11-14 NOTE — Telephone Encounter (Signed)
Spoke with pt and he states went to ER and has been started on Zpak and Amoxicillin. Instructed to take Zpak as ordered and Amoxicillin as ordered as there is no interaction between coumadin and amoxicillin and ocasionally there can be interaction between Zpak and coumadin so instructed to eat some extra greens while on Zpak and to continue these medications as ordered and to come to appt as ordered on next Tuesday and he states understanding

## 2017-12-05 ENCOUNTER — Ambulatory Visit (INDEPENDENT_AMBULATORY_CARE_PROVIDER_SITE_OTHER): Payer: Self-pay | Admitting: *Deleted

## 2017-12-05 ENCOUNTER — Encounter: Payer: Self-pay | Admitting: *Deleted

## 2017-12-05 DIAGNOSIS — Z5181 Encounter for therapeutic drug level monitoring: Secondary | ICD-10-CM

## 2017-12-05 LAB — POCT INR: INR: 2.6

## 2017-12-05 NOTE — Patient Instructions (Signed)
Description   Continue taking 1 tablet daily except 1/2 tablet on Mondays and Wednesday. Stop self medicating. Recheck INR in 4 weeks.  Call with any new medications any procedures or any concerns . Coumadin Clinic [phone].

## 2018-01-02 ENCOUNTER — Encounter: Payer: Self-pay | Admitting: *Deleted

## 2018-01-02 ENCOUNTER — Ambulatory Visit (INDEPENDENT_AMBULATORY_CARE_PROVIDER_SITE_OTHER): Payer: Self-pay | Admitting: *Deleted

## 2018-01-02 DIAGNOSIS — Z5181 Encounter for therapeutic drug level monitoring: Secondary | ICD-10-CM

## 2018-01-02 LAB — POCT INR: INR: 2.9 (ref 2.0–3.0)

## 2018-01-02 NOTE — Patient Instructions (Signed)
Description   Continue taking 1 tablet daily except 1/2 tablet on Mondays and Wednesday. Stop self medicating. Recheck INR in 4 weeks.  Call with any new medications any procedures or any concerns . [facility]in Clinic [phone].

## 2018-01-30 ENCOUNTER — Ambulatory Visit (INDEPENDENT_AMBULATORY_CARE_PROVIDER_SITE_OTHER): Payer: Self-pay | Admitting: *Deleted

## 2018-01-30 ENCOUNTER — Encounter: Payer: Self-pay | Admitting: *Deleted

## 2018-01-30 DIAGNOSIS — Z86718 Personal history of other venous thrombosis and embolism: Secondary | ICD-10-CM

## 2018-01-30 DIAGNOSIS — Z5181 Encounter for therapeutic drug level monitoring: Secondary | ICD-10-CM

## 2018-01-30 LAB — POCT INR: INR: 1.4 — AB (ref 2.0–3.0)

## 2018-01-30 MED ORDER — WARFARIN SODIUM 10 MG PO TABS
ORAL_TABLET | ORAL | 1 refills | Status: DC
Start: 2018-01-30 — End: 2018-02-19

## 2018-01-30 NOTE — Patient Instructions (Signed)
Description   Today June 25th take 1 and 1/2 tablets then tomorrow June 26th take 1 tablet then continue taking 1 tablet daily except 1/2 tablet on Mondays and Wednesday. Stop self medicating. Recheck INR in 10 days.  Call with any new medications any procedures or any concerns . [facility] Clinic [phone].

## 2018-02-19 ENCOUNTER — Encounter: Payer: Self-pay | Admitting: *Deleted

## 2018-02-19 ENCOUNTER — Ambulatory Visit (INDEPENDENT_AMBULATORY_CARE_PROVIDER_SITE_OTHER): Payer: Self-pay | Admitting: *Deleted

## 2018-02-19 DIAGNOSIS — Z86718 Personal history of other venous thrombosis and embolism: Secondary | ICD-10-CM

## 2018-02-19 DIAGNOSIS — Z5181 Encounter for therapeutic drug level monitoring: Secondary | ICD-10-CM

## 2018-02-19 LAB — POCT INR: INR: 2.4 (ref 2.0–3.0)

## 2018-02-19 MED ORDER — WARFARIN SODIUM 10 MG PO TABS: ORAL_TABLET | ORAL | 1 refills | Status: DC

## 2018-02-19 NOTE — Patient Instructions (Signed)
Description   Continue taking 1 tablet daily except 1/2 tablet on Mondays and Wednesday.. Recheck INR in 3 weeks.  Call with any new medications any procedures or any concerns . Coumadin Clinic [phone].

## 2018-03-12 ENCOUNTER — Encounter: Payer: Self-pay | Admitting: Pharmacist

## 2018-03-12 ENCOUNTER — Ambulatory Visit (INDEPENDENT_AMBULATORY_CARE_PROVIDER_SITE_OTHER): Payer: Self-pay | Admitting: Pharmacist

## 2018-03-12 DIAGNOSIS — Z5181 Encounter for therapeutic drug level monitoring: Secondary | ICD-10-CM

## 2018-03-12 LAB — POCT INR: INR: 3.6 — AB (ref 2.0–3.0)

## 2018-03-12 NOTE — Patient Instructions (Signed)
Description   Skip your Coumadin tomorrow, then continue taking 1 tablet daily except 1/2 tablet on Mondays and Wednesday. Recheck INR in 3 weeks.  Call with any new medications any procedures or any concerns . [facility] Clinic [phone].

## 2018-04-06 ENCOUNTER — Ambulatory Visit (INDEPENDENT_AMBULATORY_CARE_PROVIDER_SITE_OTHER): Payer: Self-pay | Admitting: *Deleted

## 2018-04-06 ENCOUNTER — Encounter: Payer: Self-pay | Admitting: *Deleted

## 2018-04-06 DIAGNOSIS — Z5181 Encounter for therapeutic drug level monitoring: Secondary | ICD-10-CM

## 2018-04-06 DIAGNOSIS — I82629 Acute embolism and thrombosis of deep veins of unspecified upper extremity: Secondary | ICD-10-CM

## 2018-04-06 LAB — POCT INR: INR: 1.8 — AB (ref 2.0–3.0)

## 2018-04-06 NOTE — Patient Instructions (Signed)
Description   Today when you get home take another 1/2 tablet then continue taking 1 tablet daily except 1/2 tablet on Mondays and Wednesdays. Recheck INR in 3 weeks.  Call with any new medications any procedures or any concerns . [facility]in Clinic [phone].

## 2018-04-27 ENCOUNTER — Ambulatory Visit (INDEPENDENT_AMBULATORY_CARE_PROVIDER_SITE_OTHER): Payer: Self-pay | Admitting: *Deleted

## 2018-04-27 DIAGNOSIS — Z5181 Encounter for therapeutic drug level monitoring: Secondary | ICD-10-CM

## 2018-04-27 DIAGNOSIS — I82629 Acute embolism and thrombosis of deep veins of unspecified upper extremity: Secondary | ICD-10-CM

## 2018-04-27 LAB — POCT INR: INR: 2.4 (ref 2.0–3.0)

## 2018-04-27 NOTE — Patient Instructions (Signed)
Description   Continue taking 1 tablet daily except 1/2 tablet on Mondays and Wednesdays. Recheck INR in 4 weeks.  Call with any new medications any procedures or any concerns . Cou[facility]in Clinic [phone].     

## 2018-05-25 ENCOUNTER — Ambulatory Visit (INDEPENDENT_AMBULATORY_CARE_PROVIDER_SITE_OTHER): Payer: Managed Care, Other (non HMO) | Admitting: Pharmacist

## 2018-05-25 DIAGNOSIS — I82629 Acute embolism and thrombosis of deep veins of unspecified upper extremity: Secondary | ICD-10-CM | POA: Diagnosis not present

## 2018-05-25 DIAGNOSIS — Z5181 Encounter for therapeutic drug level monitoring: Secondary | ICD-10-CM

## 2018-05-25 LAB — POCT INR: INR: 2.9 (ref 2.0–3.0)

## 2018-05-25 NOTE — Patient Instructions (Signed)
Description   Continue taking 1 tablet daily except 1/2 tablet on Mondays and Wednesdays. Recheck INR in 4 weeks.  Call with any new medications any procedures or any concerns . Cou[facility]in Clinic [phone].     

## 2018-06-08 ENCOUNTER — Ambulatory Visit (INDEPENDENT_AMBULATORY_CARE_PROVIDER_SITE_OTHER): Payer: Managed Care, Other (non HMO) | Admitting: Cardiovascular Disease

## 2018-06-08 ENCOUNTER — Encounter: Payer: Self-pay | Admitting: Cardiovascular Disease

## 2018-06-08 VITALS — BP 132/84 | HR 82 | Ht 73.0 in | Wt 208.1 lb

## 2018-06-08 DIAGNOSIS — R252 Cramp and spasm: Secondary | ICD-10-CM | POA: Diagnosis not present

## 2018-06-08 DIAGNOSIS — I739 Peripheral vascular disease, unspecified: Secondary | ICD-10-CM

## 2018-06-08 DIAGNOSIS — I10 Essential (primary) hypertension: Secondary | ICD-10-CM

## 2018-06-08 NOTE — Progress Notes (Signed)
Cardiology Office Note:    Date:  06/08/2018   ID:  [name], DOB February 21, 1961, MRN [id number]  PCP:  Colon Branch, MD  Cardiologist:  No primary care provider on file.  Electrophysiologist:  None   Referring MD: Colon Branch, MD   Chief Complaint  Patient presents with  . PAD   History of Present Illness:    [name] is a 57 y.o. male with a hx of lower extremity PAD maintained on chronic warfarin.  He is presenting for follow-up evaluation.  The patient has a history of hypercoagulable disorder with left femoral and popliteal artery occlusion in 2008 requiring embolectomy as well as a history of renal infarction.  Cardiac catheterization in 2012 performed for chronic chest pain demonstrated widely patent coronary arteries with no obstructive disease.  The patient is here alone today.  He continues to smoke 1/2 to 1 pack of cigarettes per day.  He is drinking alcohol on a daily basis as well.  He states that he splits a pint of liquor with his wife.  He has daily episodes of morning vomiting.  He is undergone GI evaluation for this.  He has not had any recent chest pain or shortness of breath.  He has chronic swelling in the left leg, but no symptoms of calf claudication.  He is tolerating warfarin without bleeding problems.  Past Medical History:  Diagnosis Date  . Chest pain    stres test neg x 2, cath 5/12; minimal LAD irregs; no obs CAD; normal LVF  . Clotting disorder ([place])   . GERD (gastroesophageal reflux disease)   . History of DVT of lower extremity    on chronic coumadin  . Hypertension   . Internal hemorrhoids    Cscope 2007  . PAD (peripheral artery disease) ([place])    a. left leg ischemia tx wtih embolectomy of left fem, pop, tib arteries with Dr. Scot Dock - 08/2006;  b. known occlusion of right pop with collats - med Rx;  c.ABI's 8/09: R 1.0; L 0.91   . Polysubstance abuse ([place])   . Renal infarct [facility])    R    Past Surgical History:  Procedure  Laterality Date  . CHOLECYSTECTOMY    . COLONOSCOPY    . left leg blood clot removal 2009  2009  . TONSILLECTOMY    . WRIST SURGERY      Current Medications: Current Meds  Medication Sig  . lisinopril (PRINIVIL,ZESTRIL) 10 MG tablet Take 1 tablet (10 mg total) daily by mouth.  . pantoprazole (PROTONIX) 40 MG tablet Take 1 tablet (40 mg total) daily by mouth.  . ranitidine (ZANTAC) 150 MG tablet Take 1 tablet (150 mg total) by mouth at bedtime.  [name] warfarin (COUMADIN) 10 MG tablet USE AS DIRECTED BY  COUMADIN  CLINIC   Current Facility-Administered Medications for the 06/08/18 encounter (Office Visit) with Sherren Mocha, MD  Medication  . 0.9 %  sodium chloride infusion     Allergies:   Pravastatin and Simvastatin   Social History   Socioeconomic History  . Marital status: Married    Spouse name: Not on file  . Number of children: 2  . Years of education: Not on file  . Highest education level: Not on file  Occupational History  . Occupation: Editor, commissioning, Geophysicist/field seismologist   Social Needs  . Financial resource strain: Not on file  . Food insecurity:    Worry: Not on file    Inability: Not on  file  . Transportation needs:    Medical: Not on file    Non-medical: Not on file  Tobacco Use  . Smoking status: Current Every Day Smoker    Packs/day: 0.00    Years: 30.00    Pack years: 0.00    Types: Cigarettes  . Smokeless tobacco: Never Used  . Tobacco comment: 1 to 1.5 ppd   Substance and Sexual Activity  . Alcohol use: Yes    Comment: ETOH most days ," 2-3 beers , couple of shots"  . Drug use: Yes    Types: Marijuana    Comment: 10/25/16  . Sexual activity: Not on file  Lifestyle  . Physical activity:    Days per week: Not on file    Minutes per session: Not on file  . Stress: Not on file  Relationships  . Social connections:    Talks on phone: Not on file    Gets together: Not on file    Attends religious service: Not on file    Active member of club or organization: Not  on file    Attends meetings of clubs or organizations: Not on file    Relationship status: Not on file  Other Topics Concern  . Not on file  Social History Narrative   1 child lives w/ them     Family History: The patient's family history includes Breast cancer in his mother; CAD in his father; Diabetes in his father; Heart disease in his paternal grandmother; Hypertension in his father and mother; Lung cancer in his maternal uncle; Pancreatic cancer in his brother; Prostate cancer in his maternal uncle. There is no history of Colon cancer.  ROS:   Please see the history of present illness.    Positive for leg swelling, muscle pain, easy bruising, nausea/vomiting, muscle cramps.  All other systems reviewed and are negative.  EKGs/Labs/Other Studies Reviewed:    EKG:  EKG is ordered today.  The ekg ordered today demonstrates normal sinus rhythm 82 bpm, within normal limits.  Recent Labs: No results found for requested labs within last 8760 hours.  Recent Lipid Panel    Component Value Date/Time   CHOL 241 (H) 07/25/2016 1539   TRIG 179.0 (H) 07/25/2016 1539   HDL 51.20 07/25/2016 1539   CHOLHDL 5 07/25/2016 1539   VLDL 35.8 07/25/2016 1539   LDLCALC 154 (H) 07/25/2016 1539   LDLDIRECT 143.7 11/24/2008 1235    Physical Exam:    VS:  BP 132/84   Pulse 82   Ht 6\' 1"  (1.854 m)   Wt 208 lb 1.9 oz (94.4 kg)   SpO2 98%   BMI 27.46 kg/m     Wt Readings from Last 3 Encounters:  06/08/18 208 lb 1.9 oz (94.4 kg)  06/20/17 209 lb 8 oz (95 kg)  02/22/17 200 lb (90.7 kg)     GEN:  Well nourished, well developed in no acute distress HEENT: Normal NECK: No JVD; No carotid bruits LYMPHATICS: No lymphadenopathy CARDIAC: RRR, no murmurs, rubs, gallops RESPIRATORY:  Clear to auscultation without rales, wheezing or rhonchi  ABDOMEN: Soft, non-tender, non-distended MUSCULOSKELETAL:  1+ left lower leg edema; No deformity  SKIN: Warm and dry NEUROLOGIC:  Alert and oriented x  3 PSYCHIATRIC:  Normal affect   ASSESSMENT:    1. Muscle cramping   2. PAD (peripheral artery disease) ([place])   3. Essential hypertension    PLAN:    In order of problems listed above:  1. We discussed adequate  fluid hydration.  We discussed the importance of reducing his alcohol intake and complete tobacco cessation.  He is counseled in this regard today.  We will check an electrolyte panel to further evaluate his cramping.  We will add LFTs and a metabolic panel. 2. The patient is anticoagulated with warfarin.  Again we discussed the importance of complete tobacco cessation in the setting of his hypercoagulable state and PAD.  No changes are made in his medical regimen today. 3. Blood pressure is controlled on lisinopril 10 mg daily.   Medication Adjustments/Labs and Tests Ordered: Current medicines are reviewed at length with the patient today.  Concerns regarding medicines are outlined above.  Orders Placed This Encounter  Procedures  . Comprehensive metabolic panel  . Magnesium  . EKG 12-Lead   No orders of the defined types were placed in this encounter.   Patient Instructions  Medication Instructions:  Your provider recommends that you continue on your current medications as directed. Please refer to the Current Medication list given to you today.    Labwork: TODAY: CMET, mag  Testing/Procedures: None  Follow-Up: Your provider wants you to follow-up in: 1 year with Dr. Burt Knack. You will receive a reminder letter in the mail two months in advance. If you don't receive a letter, please call our office to schedule the follow-up appointment.    Any Other Special Instructions Will Be Listed Below (If Applicable).     If you need a refill on your cardiac medications before your next appointment, please call your pharmacy.      Signed, Sherren Mocha, MD  06/08/2018 5:12 PM    [place]

## 2018-06-08 NOTE — Patient Instructions (Signed)
Medication Instructions:  Your provider recommends that you continue on your current medications as directed. Please refer to the Current Medication list given to you today.    Labwork: TODAY: CMET, mag  Testing/Procedures: None  Follow-Up: Your provider wants you to follow-up in: 1 year with Dr. Burt Knack. You will receive a reminder letter in the mail two months in advance. If you don't receive a letter, please call our office to schedule the follow-up appointment.    Any Other Special Instructions Will Be Listed Below (If Applicable).     If you need a refill on your cardiac medications before your next appointment, please call your pharmacy.

## 2018-06-09 LAB — COMPREHENSIVE METABOLIC PANEL
ALK PHOS: 73 IU/L (ref 39–117)
ALT: 19 IU/L (ref 0–44)
AST: 23 IU/L (ref 0–40)
Albumin/Globulin Ratio: 1.9 (ref 1.2–2.2)
Albumin: 4.1 g/dL (ref 3.5–5.5)
BILIRUBIN TOTAL: 0.3 mg/dL (ref 0.0–1.2)
BUN/Creatinine Ratio: 15 (ref 9–20)
BUN: 16 mg/dL (ref 6–24)
CHLORIDE: 106 mmol/L (ref 96–106)
CO2: 20 mmol/L (ref 20–29)
CREATININE: 1.1 mg/dL (ref 0.76–1.27)
Calcium: 9.2 mg/dL (ref 8.7–10.2)
GFR calc Af Amer: 87 mL/min/{1.73_m2} (ref >59)
GFR calc non Af Amer: 75 mL/min/{1.73_m2} (ref >59)
GLUCOSE: 102 mg/dL — AB (ref 65–99)
Globulin, Total: 2.2 g/dL (ref 1.5–4.5)
Potassium: 3.9 mmol/L (ref 3.5–5.2)
SODIUM: 143 mmol/L (ref 134–144)
Total Protein: 6.3 g/dL (ref 6.0–8.5)

## 2018-06-09 LAB — MAGNESIUM: MAGNESIUM: 1.8 mg/dL (ref 1.6–2.3)

## 2018-06-22 ENCOUNTER — Ambulatory Visit (INDEPENDENT_AMBULATORY_CARE_PROVIDER_SITE_OTHER): Payer: Managed Care, Other (non HMO) | Admitting: *Deleted

## 2018-06-22 DIAGNOSIS — Z5181 Encounter for therapeutic drug level monitoring: Secondary | ICD-10-CM

## 2018-06-22 DIAGNOSIS — I82629 Acute embolism and thrombosis of deep veins of unspecified upper extremity: Secondary | ICD-10-CM

## 2018-06-22 LAB — POCT INR: INR: 1.9 — AB (ref 2.0–3.0)

## 2018-06-22 NOTE — Patient Instructions (Signed)
  Description   Today take 1.5 tablets then, continue taking 1 tablet daily except 1/2 tablet on Mondays and Wednesdays. Recheck INR in 4 weeks.  Call with any new medications any procedures or any concerns . Coumadin Clinic [phone].

## 2018-06-25 ENCOUNTER — Ambulatory Visit: Payer: Managed Care, Other (non HMO) | Admitting: Physician Assistant

## 2018-07-20 ENCOUNTER — Ambulatory Visit (INDEPENDENT_AMBULATORY_CARE_PROVIDER_SITE_OTHER): Payer: Managed Care, Other (non HMO) | Admitting: *Deleted

## 2018-07-20 DIAGNOSIS — I82629 Acute embolism and thrombosis of deep veins of unspecified upper extremity: Secondary | ICD-10-CM | POA: Diagnosis not present

## 2018-07-20 DIAGNOSIS — Z5181 Encounter for therapeutic drug level monitoring: Secondary | ICD-10-CM

## 2018-07-20 LAB — POCT INR: INR: 2.6 (ref 2.0–3.0)

## 2018-07-20 NOTE — Patient Instructions (Signed)
Description   Continue taking 1 tablet daily except 1/2 tablet on Mondays and Wednesdays. Recheck INR in 4 weeks.  Call with any new medications any procedures or any concerns . Coumadin Clinic [phone].

## 2018-07-28 ENCOUNTER — Other Ambulatory Visit: Payer: Self-pay | Admitting: Cardiovascular Disease

## 2018-07-28 DIAGNOSIS — I10 Essential (primary) hypertension: Secondary | ICD-10-CM

## 2018-07-28 DIAGNOSIS — E785 Hyperlipidemia, unspecified: Secondary | ICD-10-CM

## 2018-08-03 ENCOUNTER — Telehealth: Payer: Self-pay | Admitting: Cardiovascular Disease

## 2018-08-03 NOTE — Telephone Encounter (Signed)
Patient is out of his medication    *STAT* If patient is at the pharmacy, call can be transferred to refill team.   1. Which medications need to be refilled? (please list name of each medication and dose if known)   lisinopril (PRINIVIL,ZESTRIL) 10 MG tablet  2. Which pharmacy/location (including street and city if local pharmacy) is medication to be sent to?   [place], [address]   3. Do they need a 30 day or 90 day supply?  90 Days   Patient is out of his medication.

## 2018-08-03 NOTE — Telephone Encounter (Signed)
Spoke with patient and made him aware that the rx was sent in on 07/30/18 as below. He verbalized his understanding and will call the pharmacy to be sure that it is ready for him to pick up.   Outpatient Medication Detail    Disp Refills Start End   lisinopril (PRINIVIL,ZESTRIL) 10 MG tablet 90 tablet 3 07/30/2018    Sig - Route: Take 1 tablet (10 mg total) by mouth daily. - Oral   Sent to pharmacy as: lisinopril (PRINIVIL,ZESTRIL) 10 MG tablet   E-Prescribing Status: Receipt confirmed by pharmacy (07/30/2018 2:00 PM EST)   Associated Diagnoses   Essential hypertension     Hyperlipidemia     [place], [place]

## 2018-08-13 ENCOUNTER — Other Ambulatory Visit: Payer: Self-pay

## 2018-08-13 ENCOUNTER — Encounter (HOSPITAL_COMMUNITY): Payer: Self-pay | Admitting: Emergency Medicine

## 2018-08-13 ENCOUNTER — Emergency Department (HOSPITAL_COMMUNITY)
Admission: EM | Admit: 2018-08-13 | Discharge: 2018-08-14 | Disposition: A | Discharge status: Home / Self Care | Payer: Managed Care, Other (non HMO) | Attending: Emergency Medicine | Admitting: Emergency Medicine

## 2018-08-13 ENCOUNTER — Emergency Department (HOSPITAL_COMMUNITY): Payer: Managed Care, Other (non HMO)

## 2018-08-13 DIAGNOSIS — I739 Peripheral vascular disease, unspecified: Secondary | ICD-10-CM | POA: Insufficient documentation

## 2018-08-13 DIAGNOSIS — Z86718 Personal history of other venous thrombosis and embolism: Secondary | ICD-10-CM | POA: Diagnosis not present

## 2018-08-13 DIAGNOSIS — Z7901 Long term (current) use of anticoagulants: Secondary | ICD-10-CM | POA: Insufficient documentation

## 2018-08-13 DIAGNOSIS — Z79899 Other long term (current) drug therapy: Secondary | ICD-10-CM | POA: Diagnosis not present

## 2018-08-13 DIAGNOSIS — I10 Essential (primary) hypertension: Secondary | ICD-10-CM | POA: Insufficient documentation

## 2018-08-13 DIAGNOSIS — M542 Cervicalgia: Secondary | ICD-10-CM | POA: Diagnosis not present

## 2018-08-13 DIAGNOSIS — F17210 Nicotine dependence, cigarettes, uncomplicated: Secondary | ICD-10-CM | POA: Diagnosis not present

## 2018-08-13 DIAGNOSIS — M79602 Pain in left arm: Secondary | ICD-10-CM | POA: Diagnosis not present

## 2018-08-13 DIAGNOSIS — R0789 Other chest pain: Secondary | ICD-10-CM | POA: Diagnosis present

## 2018-08-13 LAB — BASIC METABOLIC PANEL
Anion gap: 10 (ref 5–15)
BUN: 12 mg/dL (ref 6–20)
CO2: 19 mmol/L — ABNORMAL LOW (ref 22–32)
Calcium: 9.2 mg/dL (ref 8.9–10.3)
Chloride: 110 mmol/L (ref 98–111)
Creatinine, Ser: 1.12 mg/dL (ref 0.61–1.24)
GFR calc Af Amer: >60 mL/min (ref >60)
Glucose, Bld: 86 mg/dL (ref 70–99)
Potassium: 3.4 mmol/L — ABNORMAL LOW (ref 3.5–5.1)
SODIUM: 139 mmol/L (ref 135–145)

## 2018-08-13 LAB — CBC
HCT: 43.9 % (ref 39.0–52.0)
HEMOGLOBIN: 14.5 g/dL (ref 13.0–17.0)
MCH: 31.5 pg (ref 26.0–34.0)
MCHC: 33 g/dL (ref 30.0–36.0)
MCV: 95.4 fL (ref 80.0–100.0)
Platelets: 251 10*3/uL (ref 150–400)
RBC: 4.6 MIL/uL (ref 4.22–5.81)
RDW: 13.7 % (ref 11.5–15.5)
WBC: 7.3 10*3/uL (ref 4.0–10.5)
nRBC: 0 % (ref 0.0–0.2)

## 2018-08-13 LAB — I-STAT TROPONIN, ED: TROPONIN I, POC: 0.01 ng/mL (ref 0.00–0.08)

## 2018-08-13 NOTE — ED Triage Notes (Signed)
Pt reports chest tightness. Pt reports L shoulder pain radiating to L arm since Friday. Pt denies any injury. Pt denies SHOB. Pt reports excessive smoking and drinking. Pt reports he takes warfarin for hx of DVT. Pt reports some nausea and light-headedness. Pt also reports L ear pain.

## 2018-08-14 ENCOUNTER — Telehealth: Payer: Self-pay | Admitting: Cardiovascular Disease

## 2018-08-14 DIAGNOSIS — R0789 Other chest pain: Secondary | ICD-10-CM | POA: Diagnosis not present

## 2018-08-14 LAB — PROTIME-INR
INR: 1.35
Prothrombin Time: 16.5 seconds — ABNORMAL HIGH (ref 11.4–15.2)

## 2018-08-14 LAB — I-STAT TROPONIN, ED: Troponin i, poc: 0.01 ng/mL (ref 0.00–0.08)

## 2018-08-14 MED ORDER — DIAZEPAM 5 MG PO TABS
ORAL_TABLET | ORAL | Status: AC
  Filled 2018-08-14: qty 1

## 2018-08-14 MED ORDER — METHOCARBAMOL 500 MG PO TABS: 500.0000 mg | ORAL_TABLET | Freq: Two times a day (BID) | ORAL | 0 refills | Status: AC

## 2018-08-14 MED ORDER — DIAZEPAM 5 MG PO TABS
10.0000 mg | ORAL_TABLET | Freq: Once | ORAL | Status: AC
  Administered 2018-08-14: 10 mg via ORAL
  Filled 2018-08-14 (×2): qty 2

## 2018-08-14 NOTE — Telephone Encounter (Signed)
New message:  Patient states that his coumadin is low and like for a nurse to call him back.

## 2018-08-14 NOTE — Discharge Instructions (Addendum)
I have prescribed patient to help with your likely muscular pain, please take 1 tablet twice a day for the next 7 days.  Please follow-up with your primary care physician on chest pain and further management.  If you experience any shortness of breath, worsening symptoms please return to the ED. Have prescribed muscle relaxers for your pain, please do not drink or drive while taking this medications as they can make you drowsy.

## 2018-08-14 NOTE — ED Provider Notes (Signed)
[facility] EMERGENCY DEPARTMENT Provider Note   CSN: [id number] Arrival date & time: 08/13/18  2009     History   Chief Complaint Chief Complaint  Patient presents with  . Chest Pain    HPI [name] is a 58 y.o. male.  58 y.o male with a PMH of HTN, DVT, PAD to the ED with a chief complaint of chest pain, neck pain, left arm pain x 4 days.  Patient reports he is on chronic Coumadin due to a previous history of DVTs, he reports having a monthly episode of body cramps along with chest tightness.  He reports having this episode at work on Friday which lasted 5 minutes beginning with pain on his chest but worst on the left side.  Reports the pain then radiated to his left arm and neck region.  He reports feeling some nausea at the moment.  He did not attempt any medication for relieving his chest pain but did have some BC powders to help with his left arm pain.  Was seen by cardiologist within the past of months, he also reports having a negative stress test: treadmill and pharmacologic.  Also reports some left ear fullness with some itching, however this symptom has now resolved to patient.  Patient reports he is currently chest pain free.  He also reports he has not missed any doses of warfarin.  Prior to arrival patient reports taking his blood pressure at [facility] which was 153/103, he also states missing his blood pressure medication for a couple of days.  Any shortness of breath, does report a cough which is baseline for him as he currently smokes a pack a day.  This encounter wife stated patient drinks and smokes a lot.  Denies any vomiting, headache, weakness or other complaints.      Past Medical History:  Diagnosis Date  . Chest pain    stres test neg x 2, cath 5/12; minimal LAD irregs; no obs CAD; normal LVF  . Clotting disorder ([place])   . GERD (gastroesophageal reflux disease)   . History of DVT of lower extremity    on chronic coumadin  .  Hypertension   . Internal hemorrhoids    Cscope 2007  . PAD (peripheral artery disease) ([place])    a. left leg ischemia tx wtih embolectomy of left fem, pop, tib arteries with Dr. Scot Dock - 08/2006;  b. known occlusion of right pop with collats - med Rx;  c.ABI's 8/09: R 1.0; L 0.91   . Polysubstance abuse ([place])   . Renal infarct [facility])    R    Patient Active Problem List   Diagnosis Date Noted  . PCP NOTES >>>>>>>>>>>>>> 07/26/2016  . GERD (gastroesophageal reflux disease) 07/26/2016  . Annual physical exam 07/25/2016  . HTN (hypertension) 09/16/2014  . Nausea without vomiting 11/29/2013  . Encounter for therapeutic drug monitoring 09/05/2013  . Deep vein thrombosis ([place]) 10/06/2010  . Long term current use of anticoagulant 10/06/2010  . Renal infarct ([place]) 10/06/2010  . Dyslipidemia 09/03/2009  . CHEST PAIN-UNSPECIFIED 09/03/2009  . SHOULDER PAIN, RIGHT 03/02/2009  . PERIPHERAL VASCULAR DISEASE 02/11/2009  . SKIN LESION 02/04/2008  . SUBSTANCE ABUSE, MULTIPLE 05/28/2007  . DISORDER, VASCULAR, KIDNEY 05/28/2007  . DVT, HX OF 12/25/2006    Past Surgical History:  Procedure Laterality Date  . CHOLECYSTECTOMY    . COLONOSCOPY    . left leg blood clot removal 2009  2009  . TONSILLECTOMY    .  WRIST SURGERY          Home Medications    Prior to Admission medications   Medication Sig Start Date End Date Taking? Authorizing Provider  lisinopril (PRINIVIL,ZESTRIL) 10 MG tablet Take 1 tablet (10 mg total) by mouth daily. 07/30/18   Sherren Mocha, MD  methocarbamol (ROBAXIN) 500 MG tablet Take 1 tablet (500 mg total) by mouth 2 (two) times daily for 7 days. 08/14/18 08/21/18  Janeece Fitting, PA-C  pantoprazole (PROTONIX) 40 MG tablet Take 1 tablet (40 mg total) daily by mouth. 06/20/17   Bhagat, Bhavinkumar, PA  ranitidine (ZANTAC) 150 MG tablet Take 1 tablet (150 mg total) by mouth at bedtime. 02/22/17   Street, [place], PA-C  warfarin (COUMADIN) 10 MG tablet TAKE AS DIRECTED BY  COUMADIN CLINIC 07/30/18   Sherren Mocha, MD    Family History Family History  Problem Relation Age of Onset  . Hypertension Mother   . Breast cancer Mother   . Hypertension Father   . CAD Father   . Diabetes Father   . Lung cancer Maternal Uncle   . Pancreatic cancer Brother   . Prostate cancer Maternal Uncle   . Heart disease Paternal Grandmother   . Colon cancer Neg Hx     Social History Social History   Tobacco Use  . Smoking status: Current Every Day Smoker    Packs/day: 0.00    Years: 30.00    Pack years: 0.00    Types: Cigarettes  . Smokeless tobacco: Never Used  . Tobacco comment: 1 to 1.5 ppd   Substance Use Topics  . Alcohol use: Yes    Comment: ETOH most days ," 2- 40 oz beers , and a pint of liquor  . Drug use: Yes    Types: Marijuana    Comment: 10/25/16     Allergies   Pravastatin and Simvastatin   Review of Systems Review of Systems  Constitutional: Negative for fever.  HENT: Negative for ear pain, sore throat and tinnitus.   Respiratory: Positive for cough. Negative for shortness of breath.   Cardiovascular: Positive for chest pain.  Gastrointestinal: Negative for abdominal pain, diarrhea, nausea and vomiting.  Genitourinary: Negative for dysuria.  Musculoskeletal: Positive for myalgias and neck pain. Negative for back pain and neck stiffness.  Skin: Negative for pallor and wound.  Neurological: Negative for dizziness, facial asymmetry, light-headedness and headaches.  All other systems reviewed and are negative.    Physical Exam Updated Vital Signs BP (!) 133/98 (BP Location: Right Arm)   Pulse 75   Temp 98.2 F (36.8 C) (Oral)   Resp 16   Ht 6\' 1"  (1.854 m)   Wt 95.3 kg   SpO2 99%   BMI 27.71 kg/m   Physical Exam Vitals signs and nursing note reviewed.  Constitutional:      Appearance: He is well-developed.  HENT:     Head: Normocephalic and atraumatic.  Eyes:     General: No scleral icterus.    Pupils: Pupils are equal,  round, and reactive to light.  Neck:     Musculoskeletal: Normal range of motion.  Cardiovascular:     Heart sounds: Normal heart sounds.  Pulmonary:     Effort: Pulmonary effort is normal.     Breath sounds: Examination of the right-upper field reveals decreased breath sounds. Examination of the right-middle field reveals decreased breath sounds. Examination of the left-lower field reveals decreased breath sounds. Decreased breath sounds and rhonchi present. No wheezing.  Chest:  Chest wall: No tenderness.  Abdominal:     General: Bowel sounds are normal. There is no distension.     Palpations: Abdomen is soft.     Tenderness: There is no abdominal tenderness.  Musculoskeletal:        General: No deformity.       Arms:     Right lower leg: No edema.     Left lower leg: No edema.     Comments: Pulses present. Strength 5/5. Full ROM with some discomfort.   Skin:    General: Skin is warm and dry.  Neurological:     Mental Status: He is alert and oriented to person, place, and time.      ED Treatments / Results  Labs (all labs ordered are listed, but only abnormal results are displayed) Labs Reviewed  BASIC METABOLIC PANEL - Abnormal; Notable for the following components:      Result Value   Potassium 3.4 (*)    CO2 19 (*)    All other components within normal limits  PROTIME-INR - Abnormal; Notable for the following components:   Prothrombin Time 16.5 (*)    All other components within normal limits  CBC  I-STAT TROPONIN, ED  I-STAT TROPONIN, ED    EKG EKG Interpretation  Date/Time:  Monday August 13 2018 20:21:45 EST Ventricular Rate:  95 PR Interval:  180 QRS Duration: 88 QT Interval:  360 QTC Calculation: 452 R Axis:   -6 Text Interpretation:  Normal sinus rhythm Minimal voltage criteria for LVH, may be normal variant Borderline ECG When compared with ECG of 01/01/2014, No significant change was found Confirmed by Delora Fuel ([id number]) on 08/13/2018 11:18:36  PM   Radiology Dg Chest 2 View  Result Date: 08/13/2018 CLINICAL DATA:  Initial evaluation for acute chest pain, shortness of breath. EXAM: CHEST - 2 VIEW COMPARISON:  Prior radiograph from 11/14/2017. FINDINGS: The cardiac and mediastinal silhouettes are stable in size and contour, and remain within normal limits. The lungs are mildly hypoinflated. Mild bibasilar subsegmental atelectasis, with probable atelectasis along the peripheral right minor fissure. No consolidative opacity. No pulmonary edema or pleural effusion. No pneumothorax. No acute osseous abnormality identified. IMPRESSION: 1. Low lung volumes with mild bibasilar subsegmental atelectasis. 2. No other active cardiopulmonary disease. Electronically Signed   By: Jeannine Boga M.D.   On: 08/13/2018 21:23    Procedures Procedures (including critical care time)  Medications Ordered in ED Medications  diazepam (VALIUM) tablet 10 mg (10 mg Oral Given 08/14/18 0102)     Initial Impression / Assessment and Plan / ED Course  I have reviewed the triage vital signs and the nursing notes.  Pertinent labs & imaging results that were available during my care of the patient were reviewed by me and considered in my medical decision making (see chart for details).     Patient with a previous history of HTN currently on medication and followed closely by cardiologist. Presents with with neck pain that is worse with movement of the left shoulder since Friday.  Patient has tried some BC powders but states no relieving symptoms.  Up in the ED show a BMP with no electrolyte function.  CBC showed no leukocytosis, hemoglobin is within normal range.  First troponin obtained yesterday at 8 PM was negative, a second troponin drawn this morning was also negative.  Patient is currently on chronic Coumadin due to DVTs he is at therapeutic range. DG Chest 2 view showed:  1. Low lung volumes  with mild bibasilar subsegmental atelectasis.  2. No other  active cardiopulmonary disease.   Currently smokes a pack and a half a day, advised by wife to quit.  He denies any cough, symptoms or behavior outside of his usual dry cough.  EGD showed no changes consistent with infarct or STEMI.  8:39 AM and reevaluated after given Valium for muscle ache on the left shoulder, he reports his chest pain is gone and he usually gets this episode monthly but he is more concerned about his neck pain along with left shoulder pain.  At this time I have advised patient his work-up has been unremarkable so far in the ED.  We will send him home on muscle relaxers to follow-up with PCP.  Patient has a recent stress test on file along with good follow-up by a cardiologist who he saw and had medication refill in the month of December.  Heart score was 3.  Patient advised to follow-up with PCP along with touch base with cardiologist if this pain reoccurs.  I suspect patient likely suffering a musculoskeletal pain due to his pain being worse with left shoulder range of motion.  Patient's vitals stable during ED visit, patient stable for discharge.  Final Clinical Impressions(s) / ED Diagnoses   Final diagnoses:  Atypical chest pain    ED Discharge Orders         Ordered    methocarbamol (ROBAXIN) 500 MG tablet  2 times daily     08/14/18 0844           Janeece Fitting, PA-C 08/14/18 0849    Ward, Delice Bison, DO 08/14/18 2336

## 2018-08-14 NOTE — ED Notes (Signed)
Pt reports he will take his HTN meds when he arrives home, aware of BP prior to discharge

## 2018-08-15 NOTE — Telephone Encounter (Signed)
Patient states he was in the ER yesterday and INR was 1.35. Was wondering if he could get instructions on what to do. He has an appointment with [place] on Friday but wants to change it. Patient already took 5mg  today. I advised patient to take an extra 5mg  today for a total of 10mg  today and tomorrow to take 1.5 tablets (15mg ) then resume normal dose. Will change patient appointment to Tuesday of next week for follow up.

## 2018-08-21 ENCOUNTER — Ambulatory Visit (INDEPENDENT_AMBULATORY_CARE_PROVIDER_SITE_OTHER): Payer: Managed Care, Other (non HMO) | Admitting: *Deleted

## 2018-08-21 DIAGNOSIS — Z5181 Encounter for therapeutic drug level monitoring: Secondary | ICD-10-CM

## 2018-08-21 DIAGNOSIS — I82629 Acute embolism and thrombosis of deep veins of unspecified upper extremity: Secondary | ICD-10-CM

## 2018-08-21 LAB — POCT INR: INR: 3.4 — AB (ref 2.0–3.0)

## 2018-08-21 NOTE — Patient Instructions (Signed)
Description   Do not take any tomorrow then continue taking 1 tablet daily except 1/2 tablet on Mondays and Wednesdays. Recheck INR in 3 weeks.  Call with any new medications any procedures or any concerns . [facility] [phone].

## 2018-09-11 ENCOUNTER — Emergency Department (HOSPITAL_BASED_OUTPATIENT_CLINIC_OR_DEPARTMENT_OTHER): Payer: Managed Care, Other (non HMO)

## 2018-09-11 ENCOUNTER — Emergency Department (HOSPITAL_BASED_OUTPATIENT_CLINIC_OR_DEPARTMENT_OTHER)
Admission: EM | Admit: 2018-09-11 | Discharge: 2018-09-11 | Disposition: A | Discharge status: Home / Self Care | Payer: Managed Care, Other (non HMO) | Attending: Emergency Medicine | Admitting: Emergency Medicine

## 2018-09-11 ENCOUNTER — Encounter (HOSPITAL_BASED_OUTPATIENT_CLINIC_OR_DEPARTMENT_OTHER): Payer: Self-pay | Admitting: *Deleted

## 2018-09-11 ENCOUNTER — Other Ambulatory Visit: Payer: Self-pay

## 2018-09-11 DIAGNOSIS — R202 Paresthesia of skin: Secondary | ICD-10-CM | POA: Insufficient documentation

## 2018-09-11 DIAGNOSIS — Z7901 Long term (current) use of anticoagulants: Secondary | ICD-10-CM | POA: Diagnosis not present

## 2018-09-11 DIAGNOSIS — R609 Edema, unspecified: Secondary | ICD-10-CM

## 2018-09-11 DIAGNOSIS — F1210 Cannabis abuse, uncomplicated: Secondary | ICD-10-CM | POA: Diagnosis not present

## 2018-09-11 DIAGNOSIS — M79605 Pain in left leg: Secondary | ICD-10-CM | POA: Insufficient documentation

## 2018-09-11 DIAGNOSIS — S4992XA Unspecified injury of left shoulder and upper arm, initial encounter: Secondary | ICD-10-CM | POA: Diagnosis present

## 2018-09-11 DIAGNOSIS — Y9289 Other specified places as the place of occurrence of the external cause: Secondary | ICD-10-CM | POA: Insufficient documentation

## 2018-09-11 DIAGNOSIS — Y9389 Activity, other specified: Secondary | ICD-10-CM | POA: Diagnosis not present

## 2018-09-11 DIAGNOSIS — F17210 Nicotine dependence, cigarettes, uncomplicated: Secondary | ICD-10-CM | POA: Insufficient documentation

## 2018-09-11 DIAGNOSIS — I10 Essential (primary) hypertension: Secondary | ICD-10-CM | POA: Insufficient documentation

## 2018-09-11 DIAGNOSIS — Y998 Other external cause status: Secondary | ICD-10-CM | POA: Insufficient documentation

## 2018-09-11 DIAGNOSIS — S46912A Strain of unspecified muscle, fascia and tendon at shoulder and upper arm level, left arm, initial encounter: Secondary | ICD-10-CM | POA: Insufficient documentation

## 2018-09-11 DIAGNOSIS — Z79899 Other long term (current) drug therapy: Secondary | ICD-10-CM | POA: Diagnosis not present

## 2018-09-11 DIAGNOSIS — X509XXA Other and unspecified overexertion or strenuous movements or postures, initial encounter: Secondary | ICD-10-CM | POA: Insufficient documentation

## 2018-09-11 LAB — PROTIME-INR
INR: 2.07
Prothrombin Time: 23 seconds — ABNORMAL HIGH (ref 11.4–15.2)

## 2018-09-11 MED ORDER — METHOCARBAMOL 500 MG PO TABS
1000.0000 mg | ORAL_TABLET | Freq: Once | ORAL | Status: AC
  Administered 2018-09-11: 1000 mg via ORAL
  Filled 2018-09-11: qty 2

## 2018-09-11 MED ORDER — METHOCARBAMOL 500 MG PO TABS: 500.0000 mg | ORAL_TABLET | Freq: Two times a day (BID) | ORAL | 0 refills | Status: DC

## 2018-09-11 MED ORDER — METHOCARBAMOL 1000 MG/10ML IJ SOLN: 1000.0000 mg | Freq: Once | INTRAMUSCULAR | Status: DC

## 2018-09-11 MED ORDER — NAPROXEN 250 MG PO TABS
500.0000 mg | ORAL_TABLET | Freq: Once | ORAL | Status: AC
  Administered 2018-09-11: 500 mg via ORAL
  Filled 2018-09-11: qty 2

## 2018-09-11 NOTE — ED Triage Notes (Signed)
Pt has had left arm, shoulder, and leg pain since December. (workers comp)

## 2018-09-11 NOTE — ED Provider Notes (Signed)
[facility] EMERGENCY DEPARTMENT Provider Note   CSN: [id number] Arrival date & time: 09/11/18  1040     History   Chief Complaint Chief Complaint  Patient presents with  . Shoulder Pain  . Leg Pain  . Arm Pain    HPI [name] is a 58 y.o. male.  Patient is a [age] black American male with past medical history of peripheral artery disease, DVT, polysubstance abuse, GERD who presents emergency department for left shoulder pain and left leg pain.  Patient reports that the left shoulder pain started on December 31 when he was at work.  He was lifting heavy steel bars.  Reports about a day or 2 later he began to have soreness in his left posterior shoulder.  Reports that he went to the emergency department and got x-rays.  Was diagnosed with a muscle strain and given some medications.  The pain persisted so he was seen again in another emergency department.  Repeat x-rays were normal and he was told that this might be a muscle versus arthritis.  He was put on prednisone and cyclobenzaprine.  Reports that he is still having pain.  Reports that he is due to start physical therapy tomorrow.  Reports that he is frustrated that his workplace will not pay for his physical therapy because his workplace thinks that this is arthritis and not related to work injury.  Patient reports occasional tingling into his arm down to his elbow.  Denies any weakness, decreased range of motion, neck pain, numbness.  He also reports that the last couple of days he has been having pain in the inner left thigh.  This is the same leg that he has had a DVT in before.  Reports that he is taking his Coumadin regularly but has not had his INR checked in about a month.  Denies any leg swelling, numbness, tingling, loss of control of bowel or bladder, saddle anesthesia, fever.  Reports that the pain in his leg is worse with walking.     Past Medical History:  Diagnosis Date  . Chest pain    stres test neg  x 2, cath 5/12; minimal LAD irregs; no obs CAD; normal LVF  . Clotting disorder ([place])   . GERD (gastroesophageal reflux disease)   . History of DVT of lower extremity    on chronic coumadin  . Hypertension   . Internal hemorrhoids    Cscope 2007  . PAD (peripheral artery disease) ([place])    a. left leg ischemia tx wtih embolectomy of left fem, pop, tib arteries with Dr. Scot Dock - 08/2006;  b. known occlusion of right pop with collats - med Rx;  c.ABI's 8/09: R 1.0; L 0.91   . Polysubstance abuse ([place])   . Renal infarct [facility])    R    Patient Active Problem List   Diagnosis Date Noted  . PCP NOTES >>>>>>>>>>>>>> 07/26/2016  . GERD (gastroesophageal reflux disease) 07/26/2016  . Annual physical exam 07/25/2016  . HTN (hypertension) 09/16/2014  . Nausea without vomiting 11/29/2013  . Encounter for therapeutic drug monitoring 09/05/2013  . Deep vein thrombosis ([place]) 10/06/2010  . Long term current use of anticoagulant 10/06/2010  . Renal infarct ([place]) 10/06/2010  . Dyslipidemia 09/03/2009  . CHEST PAIN-UNSPECIFIED 09/03/2009  . SHOULDER PAIN, RIGHT 03/02/2009  . PERIPHERAL VASCULAR DISEASE 02/11/2009  . SKIN LESION 02/04/2008  . SUBSTANCE ABUSE, MULTIPLE 05/28/2007  . DISORDER, VASCULAR, KIDNEY 05/28/2007  . DVT, HX OF 12/25/2006  Past Surgical History:  Procedure Laterality Date  . CHOLECYSTECTOMY    . COLONOSCOPY    . left leg blood clot removal 2009  2009  . TONSILLECTOMY    . WRIST SURGERY          Home Medications    Prior to Admission medications   Medication Sig Start Date End Date Taking? Authorizing Provider  lisinopril (PRINIVIL,ZESTRIL) 10 MG tablet Take 1 tablet (10 mg total) by mouth daily. 07/30/18   Sherren Mocha, MD  methocarbamol (ROBAXIN) 500 MG tablet Take 1 tablet (500 mg total) by mouth 2 (two) times daily. 09/11/18   Alveria Apley, PA-C  pantoprazole (PROTONIX) 40 MG tablet Take 1 tablet (40 mg total) daily by mouth. 06/20/17   Bhagat,  Bhavinkumar, PA  ranitidine (ZANTAC) 150 MG tablet Take 1 tablet (150 mg total) by mouth at bedtime. 02/22/17   Street, [place], PA-C  warfarin (COUMADIN) 10 MG tablet TAKE AS DIRECTED BY COUMADIN CLINIC 07/30/18   Sherren Mocha, MD    Family History Family History  Problem Relation Age of Onset  . Hypertension Mother   . Breast cancer Mother   . Hypertension Father   . CAD Father   . Diabetes Father   . Lung cancer Maternal Uncle   . Pancreatic cancer Brother   . Prostate cancer Maternal Uncle   . Heart disease Paternal Grandmother   . Colon cancer Neg Hx     Social History Social History   Tobacco Use  . Smoking status: Current Every Day Smoker    Packs/day: 0.00    Years: 30.00    Pack years: 0.00    Types: Cigarettes  . Smokeless tobacco: Never Used  . Tobacco comment: 1 to 1.5 ppd   Substance Use Topics  . Alcohol use: Yes    Comment: ETOH most days ," 2- 40 oz beers , and a pint of liquor  . Drug use: Yes    Types: Marijuana    Comment: 10/25/16     Allergies   Pravastatin and Simvastatin   Review of Systems Review of Systems  Constitutional: Negative.   Eyes: Negative for pain.  Respiratory: Negative for cough, chest tightness and shortness of breath.   Cardiovascular: Negative for chest pain, palpitations and leg swelling.  Gastrointestinal: Negative for abdominal pain, nausea and vomiting.  Endocrine: Negative for polyuria.  Genitourinary: Negative for dysuria.  Musculoskeletal: Positive for arthralgias and myalgias. Negative for back pain, gait problem, joint swelling, neck pain and neck stiffness.  Skin: Negative.   Allergic/Immunologic: Negative for environmental allergies.  Neurological: Negative for dizziness, tremors, syncope, weakness, light-headedness, numbness and headaches.     Physical Exam Updated Vital Signs BP (!) 134/100 (BP Location: Right Arm)   Pulse 86   Temp 98.3 F (36.8 C) (Oral)   Resp 18   Ht 6\' 1"  (1.854 m)   Wt  95.3 kg   SpO2 96%   BMI 27.71 kg/m   Physical Exam Vitals signs and nursing note reviewed.  Constitutional:      General: He is not in acute distress.    Appearance: Normal appearance. He is normal weight. He is not ill-appearing, toxic-appearing or diaphoretic.  HENT:     Head: Normocephalic and atraumatic.     Nose: Nose normal.     Mouth/Throat:     Mouth: Mucous membranes are moist.  Eyes:     Conjunctiva/sclera: Conjunctivae normal.     Pupils: Pupils are equal, round, and reactive to  light.  Cardiovascular:     Rate and Rhythm: Normal rate and regular rhythm.  Pulmonary:     Effort: Pulmonary effort is normal.     Breath sounds: Normal breath sounds. No wheezing, rhonchi or rales.  Musculoskeletal:     Left shoulder: He exhibits tenderness and pain. He exhibits normal range of motion, no bony tenderness, no swelling, no effusion, no crepitus, no deformity, no laceration, no spasm, normal pulse and normal strength.     Left elbow: Normal.     Left wrist: Normal.       Arms:     Right lower leg: No edema.     Left lower leg: No edema.     Comments: Lower extremities appear equal without swelling.  He has tenderness to palpation in the musculature in the inner left upper thigh.  Normal lower extremity pulses.  Skin:    General: Skin is warm.     Capillary Refill: Capillary refill takes less than 2 seconds.  Neurological:     General: No focal deficit present.     Mental Status: He is alert.  Psychiatric:        Mood and Affect: Mood normal.      ED Treatments / Results  Labs (all labs ordered are listed, but only abnormal results are displayed) Labs Reviewed  PROTIME-INR - Abnormal; Notable for the following components:      Result Value   Prothrombin Time 23.0 (*)    All other components within normal limits    EKG None  Radiology No results found.  Procedures Procedures (including critical care time)  Medications Ordered in ED Medications    naproxen (NAPROSYN) tablet 500 mg (500 mg Oral Given 09/11/18 1204)  methocarbamol (ROBAXIN) tablet 1,000 mg (1,000 mg Oral Given 09/11/18 1204)     Initial Impression / Assessment and Plan / ED Course  I have reviewed the triage vital signs and the nursing notes.  Pertinent labs & imaging results that were available during my care of the patient were reviewed by me and considered in my medical decision making (see chart for details).  Clinical Course as of Sep 11 1232  Tue Sep 11, 2018  1132 I believe patient has musculoskeletal strain in the left posterior trapezius.  He has already had 2 unremarkable x-rays done at 2 separate medical facilities.  He has not had any new injury or trauma and therefore I do not think it will be beneficial to repeat shoulder imaging today.  I am concerned that he has not had his INR checked in over a month and is now having thigh pain.  Going to recheck his INR and get a lower extremity DVT study.  Otherwise, he has no bony tenderness in the left lower extremity.   [KM]  1232 Patient's INR is therapeutic for his condition.  Patient is resting comfortably on the stretcher.  He does not appear clinically to have a DVT and his INR is therapeutic I do not think we need to do a DVT study today.  I explained to the patient the utmost importance of returning to the emergency department if he has any increased leg swelling or increased pain.  He was advised to continue taking his warfarin as prescribed and to follow-up with his primary care doctor.  I believe that he has a musculoskeletal strain/sprain in his left shoulder and he was referred to orthopedics, sports medicine for this.  He can stop the cyclobenzaprine and we will start  him on Robaxin.  He cannot take NSAIDs given the fact that he is already taking Coumadin.   [KM]    Clinical Course User Index [KM] Alveria Apley, PA-C    Based on review of vitals, medical screening exam, lab work and/or imaging, there does  not appear to be an acute, emergent etiology for the patient's symptoms. Counseled pt on good return precautions and encouraged both PCP and ED follow-up as needed.  Prior to discharge, I also discussed incidental imaging findings with patient in detail and advised appropriate, recommended follow-up in detail.  Clinical Impression: 1. Strain of left shoulder, initial encounter   2. Swelling   3. Left leg pain   4. Hypertension, unspecified type     Disposition: Discharge   This note was prepared with assistance of Dragon voice recognition software. Occasional wrong-word or sound-a-like substitutions may have occurred due to the inherent limitations of voice recognition software.   Final Clinical Impressions(s) / ED Diagnoses   Final diagnoses:  Swelling  Strain of left shoulder, initial encounter  Left leg pain  Hypertension, unspecified type    ED Discharge Orders         Ordered    methocarbamol (ROBAXIN) 500 MG tablet  2 times daily     09/11/18 1232           Kristine Royal 09/11/18 1234    Dorie Rank, MD 09/12/18 1113

## 2018-09-11 NOTE — Discharge Instructions (Signed)
Thank you for allowing me to care for you today. Please return to the emergency department if you have new or worsening symptoms, especially if you have worse leg pain or swelling. Take your medications as instructed. Continue warfarin as prescribed.  Follow-up with your regular doctor in regards to your elevated blood pressure today

## 2018-09-12 ENCOUNTER — Ambulatory Visit (INDEPENDENT_AMBULATORY_CARE_PROVIDER_SITE_OTHER): Payer: Managed Care, Other (non HMO) | Admitting: Cardiology

## 2018-09-12 DIAGNOSIS — Z5181 Encounter for therapeutic drug level monitoring: Secondary | ICD-10-CM

## 2018-09-12 DIAGNOSIS — I82629 Acute embolism and thrombosis of deep veins of unspecified upper extremity: Secondary | ICD-10-CM | POA: Diagnosis not present

## 2018-09-12 NOTE — Patient Instructions (Signed)
Description   Spoke with pt and instructed to continue taking 1 tablet daily except 1/2 tablet on Mondays and Wednesdays. Recheck INR in 4 weeks.  Call with any new medications any procedures or any concerns . [facility] [phone].

## 2018-09-26 ENCOUNTER — Other Ambulatory Visit: Payer: Self-pay | Admitting: Orthopedic Surgery

## 2018-09-26 DIAGNOSIS — M542 Cervicalgia: Secondary | ICD-10-CM

## 2018-10-04 ENCOUNTER — Other Ambulatory Visit: Payer: Self-pay

## 2018-10-06 ENCOUNTER — Other Ambulatory Visit: Payer: Self-pay | Admitting: Cardiovascular Disease

## 2018-10-08 ENCOUNTER — Ambulatory Visit: Payer: Managed Care, Other (non HMO) | Admitting: Cardiovascular Disease

## 2018-10-10 ENCOUNTER — Other Ambulatory Visit: Payer: Self-pay | Admitting: Orthopedic Surgery

## 2018-10-10 DIAGNOSIS — G8929 Other chronic pain: Secondary | ICD-10-CM

## 2018-10-10 DIAGNOSIS — M546 Pain in thoracic spine: Principal | ICD-10-CM

## 2018-10-14 ENCOUNTER — Other Ambulatory Visit: Payer: Self-pay

## 2018-10-22 ENCOUNTER — Ambulatory Visit (INDEPENDENT_AMBULATORY_CARE_PROVIDER_SITE_OTHER): Payer: Managed Care, Other (non HMO) | Admitting: Pharmacist

## 2018-10-22 ENCOUNTER — Other Ambulatory Visit: Payer: Self-pay

## 2018-10-22 DIAGNOSIS — Z5181 Encounter for therapeutic drug level monitoring: Secondary | ICD-10-CM | POA: Diagnosis not present

## 2018-10-22 DIAGNOSIS — I82629 Acute embolism and thrombosis of deep veins of unspecified upper extremity: Secondary | ICD-10-CM | POA: Diagnosis not present

## 2018-10-22 LAB — POCT INR: INR: 1.7 — AB (ref 2.0–3.0)

## 2018-10-22 MED ORDER — WARFARIN SODIUM 10 MG PO TABS: ORAL_TABLET | ORAL | 0 refills | Status: DC

## 2018-10-22 NOTE — Patient Instructions (Signed)
Description   Take an extra 1/2 tablet today, then continue taking 1 tablet daily except 1/2 tablet on Mondays and Wednesdays. Recheck INR in 3 weeks.  Call with any new medications any procedures or any concerns . [facility] [phone].

## 2018-10-27 ENCOUNTER — Other Ambulatory Visit: Payer: Self-pay

## 2018-10-27 ENCOUNTER — Ambulatory Visit
Admission: RE | Admit: 2018-10-27 | Discharge: 2018-10-27 | Disposition: A | Discharge status: Home / Self Care | Payer: Managed Care, Other (non HMO) | Source: Ambulatory Visit | Attending: Orthopedic Surgery | Admitting: Orthopedic Surgery

## 2018-10-27 DIAGNOSIS — G8929 Other chronic pain: Secondary | ICD-10-CM

## 2018-10-27 DIAGNOSIS — M546 Pain in thoracic spine: Principal | ICD-10-CM

## 2018-10-27 DIAGNOSIS — M542 Cervicalgia: Secondary | ICD-10-CM

## 2018-11-05 ENCOUNTER — Telehealth: Payer: Self-pay | Admitting: *Deleted

## 2018-11-05 NOTE — Telephone Encounter (Signed)
   [facility] Pre-operative Risk Assessment    Request for surgical clearance:  1. What type of surgery is being performed? Left C7 - T1 Epidural Cervical Spine Injection  2. When is this surgery scheduled? TBD Pending Approval  3. What type of clearance is required (medical clearance vs. Pharmacy clearance to hold med vs. Both)? Pharmacy Clearance  4. Are there any medications that need to be held prior to surgery and how long? Will need to hold Coumadin 7 days prior to injection with PT/INR the day before or morning of injection.  5. Practice name and name of physician performing surgery? [facility], Dr. Laroy Apple  6. What is your office phone number [phone]    7.   What is your office fax number [phone] Att: X-RAY  8.   Anesthesia type (None, local, MAC, general) ? Does not mention   Mayte Diers 11/05/2018, 4:09 PM  _________________________________________________________________   (provider comments below)

## 2018-11-06 NOTE — Telephone Encounter (Signed)
Pharm please address coumadin. thanks

## 2018-11-06 NOTE — Telephone Encounter (Signed)
Patient with diagnosis of prior DVT with hypercoagulable disorder on warfarin for anticoagulation.    Procedure: Left C7-T1 epidural cervical spine injection Date of procedure: TBD  CrCl 99.5 Platelet count 251  Per office protocol, patient can hold warfarin for 5 days prior to procedure.    Patient WILL need bridging with Lovenox (enoxaparin) around procedure.  Please let us know when date is set, will need at least 7-10 days lead time to be sure patient is seen and bridged accordingly.

## 2018-11-08 ENCOUNTER — Telehealth: Payer: Self-pay

## 2018-11-08 NOTE — Telephone Encounter (Signed)

## 2018-11-09 ENCOUNTER — Telehealth: Payer: Self-pay | Admitting: *Deleted

## 2018-11-09 NOTE — Telephone Encounter (Signed)
Received duplicate surgical clearance request. Faxed original over to [facility] at [phone].  Will call to verify if received.

## 2018-11-12 ENCOUNTER — Other Ambulatory Visit: Payer: Self-pay | Admitting: Pharmacist Clinician (PhC)/ Clinical Pharmacy Specialist

## 2018-11-12 ENCOUNTER — Other Ambulatory Visit: Payer: Self-pay

## 2018-11-12 ENCOUNTER — Ambulatory Visit (INDEPENDENT_AMBULATORY_CARE_PROVIDER_SITE_OTHER): Payer: Managed Care, Other (non HMO) | Admitting: *Deleted

## 2018-11-12 DIAGNOSIS — Z5181 Encounter for therapeutic drug level monitoring: Secondary | ICD-10-CM | POA: Diagnosis not present

## 2018-11-12 DIAGNOSIS — I82629 Acute embolism and thrombosis of deep veins of unspecified upper extremity: Secondary | ICD-10-CM

## 2018-11-12 LAB — PROTIME-INR
INR: 9.2 (ref 0.8–1.2)
INR: 9.2 — AB (ref <=1.1)
Prothrombin Time: 85.9 s — ABNORMAL HIGH (ref 9.1–12.0)

## 2018-11-12 NOTE — Patient Instructions (Signed)
Description   Spoke with pt and instructed pt to hold Coumadin for next 4 days and then recheck INR on Friday. Normal dose: 1 tablet daily except 1/2 tablet on Mondays and Wednesdays. Recheck INR on Friday.  Call with any new medications any procedures or any concerns . [facility] [phone].

## 2018-11-13 ENCOUNTER — Telehealth: Payer: Self-pay | Admitting: Pharmacist

## 2018-11-13 ENCOUNTER — Telehealth: Payer: Self-pay | Admitting: Cardiovascular Disease

## 2018-11-13 NOTE — Telephone Encounter (Signed)
New Message   [name] is calling from [facility] and states she has sent 2 faxes for this clearance and she is needing to know further Information. Please call      [facility] Pre-operative Risk Assessment    Request for surgical clearance:  1. What type of surgery is being performed? Cervical Epidural    2. When is this surgery scheduled? Not scheduled until pt is cleared    3. What type of clearance is required (medical clearance vs. Pharmacy clearance to hold med vs. Both)?   4. Are there any medications that need to be held prior to surgery and how long? Coumadin    5. Practice name and name of physician performing surgery?  6. What is your office phone number [phone]    7.   What is your office fax number  8.   Anesthesia type (None, local, MAC, general) ? Local    [name] 11/13/2018, 8:16 AM  _________________________________________________________________   (provider comments below)  ---

## 2018-11-13 NOTE — Telephone Encounter (Signed)
Received a phone call from [facility] with [facility] [phone]. Pt is now scheduled for procedure at 3pm on Monday 4/13. Main line [phone] if we can't reach Allissa. Advised her pt's INR was 9.2 yesterday and we are rechecking INR on Friday 4/10. They would like INR 1.5 or less. May need to recheck INR on Monday 4/13 in AM as well to ensure that INR is low enough for procedure. Pt needs Lovenox bridge around procedure, but with elevated INR this week, we may just need to bridge him on the back half of his procedure.

## 2018-11-13 NOTE — Telephone Encounter (Signed)
   Primary Cardiologist: Sherren Mocha, MD  Chart reviewed as part of pre-operative protocol coverage. Patient was contacted 11/13/2018 in reference to pre-operative risk assessment for pending surgery as outlined below.  SATISH HAMMERS was last seen on 06/2018 by Dr. Burt Knack.  Since that day, VIKTOR PHILIPP has done well. His mobility has been limited by his back and arm pain. He denies any anginal symptoms or changes in his cardiac history.   Per our pharmacy staff: Patient with diagnosis of prior DVT with hypercoagulable disorder on warfarin for anticoagulation.    Procedure: Left C7-T1 epidural cervical spine injection Date of procedure: TBD  CrCl 99.5 Platelet count 251  Per office protocol, patient can hold warfarin for 5 days prior to procedure.    Patient WILL need bridging with Lovenox (enoxaparin) around procedure.  Please let us know when date is set, will need at least 7-10 days lead time to be sure patient is seen and bridged accordingly.  Therefore, based on ACC/AHA guidelines, the patient would be at acceptable risk for the planned procedure without further cardiovascular testing.   I will route this recommendation to the requesting party via Epic fax function and remove from pre-op pool.  Please call with questions.  Tami Lin Duke, PA 11/13/2018, 9:01 AM

## 2018-11-15 ENCOUNTER — Telehealth: Payer: Self-pay

## 2018-11-15 NOTE — Telephone Encounter (Signed)

## 2018-11-16 ENCOUNTER — Telehealth: Payer: Self-pay | Admitting: *Deleted

## 2018-11-16 ENCOUNTER — Ambulatory Visit (INDEPENDENT_AMBULATORY_CARE_PROVIDER_SITE_OTHER): Payer: Managed Care, Other (non HMO) | Admitting: Pharmacist

## 2018-11-16 ENCOUNTER — Other Ambulatory Visit: Payer: Self-pay

## 2018-11-16 ENCOUNTER — Telehealth: Payer: Self-pay | Admitting: Pharmacist

## 2018-11-16 DIAGNOSIS — I82629 Acute embolism and thrombosis of deep veins of unspecified upper extremity: Secondary | ICD-10-CM | POA: Diagnosis not present

## 2018-11-16 DIAGNOSIS — Z5181 Encounter for therapeutic drug level monitoring: Secondary | ICD-10-CM | POA: Diagnosis not present

## 2018-11-16 LAB — POCT INR: INR: 1.2 — AB (ref 2.0–3.0)

## 2018-11-16 MED ORDER — ENOXAPARIN SODIUM 150 MG/ML ~~LOC~~ SOLN: 150.0000 mg | SUBCUTANEOUS | 1 refills | Status: DC

## 2018-11-16 NOTE — Telephone Encounter (Signed)
Addressed in anti coag encounter.

## 2018-11-16 NOTE — Telephone Encounter (Signed)

## 2018-11-16 NOTE — Telephone Encounter (Signed)
Follow Up:      Returning your call from today. 

## 2018-11-16 NOTE — Patient Instructions (Addendum)
4/10: Inject Lovenox 150mg  in the fatty tissue at 12pm. No Coumadin.  4/11: Inject Lovenox in the fatty tissue at 7am. No Coumadin.  4/12: Inject Lovenox in the fatty tissue in the morning at 7am. No Coumadin.  4/13: Procedure Day at 3:30pm - No Lovenox - Resume Coumadin in the evening - take 1 tablet.  4/14: Resume Lovenox inject in the fatty tissue at 7am and take Coumadin 1.5 tablets.  4/15: Inject Lovenox in the fatty tissue at 7am and take Coumadin 1/2 tablet.  4/16: Inject Lovenox in the fatty tissue at 7am and take Coumadin 1 tablet.  4/17: Coumadin appt to check INR.

## 2018-11-19 ENCOUNTER — Other Ambulatory Visit: Payer: Self-pay

## 2018-11-19 ENCOUNTER — Ambulatory Visit (INDEPENDENT_AMBULATORY_CARE_PROVIDER_SITE_OTHER): Payer: Managed Care, Other (non HMO) | Admitting: Pharmacist

## 2018-11-19 DIAGNOSIS — I82629 Acute embolism and thrombosis of deep veins of unspecified upper extremity: Secondary | ICD-10-CM | POA: Diagnosis not present

## 2018-11-19 DIAGNOSIS — Z5181 Encounter for therapeutic drug level monitoring: Secondary | ICD-10-CM

## 2018-11-19 LAB — POCT INR: INR: 0.9 — AB (ref 2.0–3.0)

## 2018-11-22 ENCOUNTER — Telehealth: Payer: Self-pay

## 2018-11-22 NOTE — Telephone Encounter (Signed)

## 2018-11-23 ENCOUNTER — Ambulatory Visit (INDEPENDENT_AMBULATORY_CARE_PROVIDER_SITE_OTHER): Payer: Managed Care, Other (non HMO) | Admitting: Pharmacist

## 2018-11-23 ENCOUNTER — Other Ambulatory Visit: Payer: Self-pay

## 2018-11-23 ENCOUNTER — Telehealth: Payer: Self-pay

## 2018-11-23 DIAGNOSIS — Z5181 Encounter for therapeutic drug level monitoring: Secondary | ICD-10-CM

## 2018-11-23 DIAGNOSIS — I82629 Acute embolism and thrombosis of deep veins of unspecified upper extremity: Secondary | ICD-10-CM

## 2018-11-23 LAB — POCT INR: INR: 1.5 — AB (ref 2.0–3.0)

## 2018-11-23 MED ORDER — ENOXAPARIN SODIUM 150 MG/ML ~~LOC~~ SOLN: 150.0000 mg | SUBCUTANEOUS | 1 refills | Status: DC

## 2018-11-23 NOTE — Telephone Encounter (Signed)
This encounter was created in error - please disregard.

## 2018-11-23 NOTE — Telephone Encounter (Signed)

## 2018-11-26 ENCOUNTER — Other Ambulatory Visit: Payer: Self-pay

## 2018-11-26 ENCOUNTER — Ambulatory Visit (INDEPENDENT_AMBULATORY_CARE_PROVIDER_SITE_OTHER): Payer: Managed Care, Other (non HMO) | Admitting: Pharmacist

## 2018-11-26 DIAGNOSIS — Z5181 Encounter for therapeutic drug level monitoring: Secondary | ICD-10-CM

## 2018-11-26 DIAGNOSIS — I82629 Acute embolism and thrombosis of deep veins of unspecified upper extremity: Secondary | ICD-10-CM | POA: Diagnosis not present

## 2018-11-26 LAB — POCT INR: INR: 2.5 (ref 2.0–3.0)

## 2018-11-30 ENCOUNTER — Telehealth: Payer: Self-pay

## 2018-11-30 NOTE — Telephone Encounter (Signed)
Last ov 12/2017Kennyth Lose- can you reach out to Pt and offer virtual visit? Or is he getting primary care elsewhere? Thank you.

## 2018-12-03 NOTE — Telephone Encounter (Signed)
Spoke with pt and offered VOV for fu appt, pt stated will call us back if he needs an appt to set up the appt at his convenience.

## 2018-12-11 ENCOUNTER — Telehealth: Payer: Self-pay | Admitting: Cardiovascular Disease

## 2018-12-11 ENCOUNTER — Telehealth: Payer: Self-pay | Admitting: Pharmacist

## 2018-12-11 NOTE — Telephone Encounter (Signed)
Patient with diagnosis of hypercoagulable state, Hx DVT, PAD on warfarin for anticoagulation.    Procedure: ESI C7-T1 Date of procedure: 12/20/2018  CrCl 72 ml/min Platelet count 251  Per office protocol, patient can hold warfarin for 5 days prior to procedure.   Patient WILL need bridging with Lovenox (enoxaparin) around procedure.  Will coordinate bridge with patient in coumadin clinic

## 2018-12-11 NOTE — Telephone Encounter (Signed)
Left VM for pt to return call. Need to move patients coumadin appointment up to this Friday 5/8 in order to coordinate bridge for procedure on 5/14.

## 2018-12-11 NOTE — Telephone Encounter (Signed)
   Primary Cardiologist: Sherren Mocha, MD  Chart reviewed as part of pre-operative protocol coverage. Patient was contacted 12/11/2018 in reference to pre-operative risk assessment for pending surgery as outlined below.  [name] was last seen on 06/08/2018 by Dr. Burt Knack.  Since that day, [name] has done fine from a cardiac standpoint but has been experiencing significant back pain. He has had no change in his chronic chest pain and denies exertional chest pain or SOB. He can complete 4 METS without difficulty.   Therefore, based on ACC/AHA guidelines, the patient would be at acceptable risk for the planned procedure without further cardiovascular testing.   Per pharmacy recommendations patient will need to be bridged with lovenox for 5 days prior to his ESI procedure. Patient instructed to contact the coumadin clinic to coordinate his appointment as it appears he missed a phone call earlier today.   I will route this recommendation to the requesting party via Epic fax function and remove from pre-op pool.  Please call with questions.  Abigail Butts, PA-C 12/11/2018, 11:15 AM

## 2018-12-11 NOTE — Telephone Encounter (Signed)
New Message:    Allissa from [facility] calling concering a fax she sent over a few times and has not recivied anything back. Please call at [phone]. The one from 04/07 has been done she need one for May.

## 2018-12-11 NOTE — Telephone Encounter (Signed)
I called and s/w Allisa at [facility] for Pre OP information.      [facility] Pre-operative Risk Assessment    Request for surgical clearance:  1. What type of surgery is being performed? ESI C7-T1  2. When is this surgery scheduled? 12/20/18  3. What type of clearance is required (medical clearance vs. Pharmacy clearance to hold med vs. Both)? BOTH  4. Are there any medications that need to be held prior to surgery and how long? COUMADIN  5. Practice name and name of physician performing surgery? [facility]  6. What is your office phone number [phone]   7.   What is your office fax number [phone]  8.   Anesthesia type (None, local, MAC, general) ? NONE LISTED   Julaine Hua 12/11/2018, 10:30 AM  _________________________________________________________________   (provider comments below)

## 2018-12-12 ENCOUNTER — Telehealth: Payer: Self-pay

## 2018-12-12 NOTE — Telephone Encounter (Signed)

## 2018-12-12 NOTE — Telephone Encounter (Signed)
Appointment changed to 5/8-instructions for lovenox bridge will be given at that time. Last dose of coumadin 5/8- 5 day hold hold begins 5/9

## 2018-12-14 ENCOUNTER — Ambulatory Visit (INDEPENDENT_AMBULATORY_CARE_PROVIDER_SITE_OTHER): Payer: Managed Care, Other (non HMO) | Admitting: Pharmacist

## 2018-12-14 ENCOUNTER — Other Ambulatory Visit: Payer: Self-pay

## 2018-12-14 DIAGNOSIS — I82629 Acute embolism and thrombosis of deep veins of unspecified upper extremity: Secondary | ICD-10-CM

## 2018-12-14 DIAGNOSIS — Z5181 Encounter for therapeutic drug level monitoring: Secondary | ICD-10-CM | POA: Diagnosis not present

## 2018-12-14 LAB — POCT INR: INR: 4.6 — AB (ref 2.0–3.0)

## 2018-12-14 NOTE — Patient Instructions (Addendum)
5/9: No Lovenox, No Coumadin.  5/10: No Lovenox, No Coumadin.  5/11: Inject Lovenox in the fatty tissue at 8pm. No Coumadin.  5/12: Inject Lovenox in the fatty tissue at 8pm. No Coumadin.  5/13: Inject Lovenox in the fatty tissue at 8pm. No Coumadin.  5/14: No Lovenox, No Coumadin.  5/15: Procedure Day - No Lovenox - Resume Coumadin in the evening - take 1 tablet.  5/16: Resume Lovenox inject in the fatty tissue at 8am and take Coumadin 1 tablet.  5/17: Inject Lovenox in the fatty tissue at 8am and take Coumadin 1 tablet.  5/18: Inject Lovenox in the fatty tissue at 8am and take Coumadin 1/2 tablet.  5/19: Inject Lovenox in the fatty tissue at 8am and take Coumadin 1 tablet.  5/20: Coumadin appt to check INR.

## 2018-12-14 NOTE — Telephone Encounter (Signed)
Clearance request entered for incorrect date - pt brought paperwork today that stated procedure is on 5/15 NOT 5/14. Lovenox bridging instructions given 5/8 reflect correct procedure date.

## 2018-12-19 ENCOUNTER — Telehealth: Payer: Self-pay

## 2018-12-19 NOTE — Telephone Encounter (Signed)

## 2018-12-20 ENCOUNTER — Ambulatory Visit (INDEPENDENT_AMBULATORY_CARE_PROVIDER_SITE_OTHER): Payer: Managed Care, Other (non HMO) | Admitting: *Deleted

## 2018-12-20 DIAGNOSIS — Z5181 Encounter for therapeutic drug level monitoring: Secondary | ICD-10-CM

## 2018-12-20 DIAGNOSIS — I82629 Acute embolism and thrombosis of deep veins of unspecified upper extremity: Secondary | ICD-10-CM

## 2018-12-20 LAB — POCT INR
INR: 1 — AB (ref 2.0–3.0)
INR: 1 — AB (ref 2.0–3.0)

## 2018-12-20 NOTE — Patient Instructions (Signed)
Description   Follow separate Lovenox instructions  (see previous instructions) for procedure on 5/15. On 5/15 after procedure, resume taking warfarin 1 tablet daily except 1/2 tablet on Mondays and Wednesdays. Recheck INR 5 days after procedure.  Call with any new medications any procedures or any concerns . [facility] [phone].

## 2018-12-21 ENCOUNTER — Other Ambulatory Visit: Payer: Self-pay

## 2018-12-24 ENCOUNTER — Telehealth: Payer: Self-pay

## 2018-12-24 NOTE — Telephone Encounter (Signed)

## 2018-12-26 ENCOUNTER — Other Ambulatory Visit: Payer: Self-pay

## 2018-12-26 ENCOUNTER — Ambulatory Visit (INDEPENDENT_AMBULATORY_CARE_PROVIDER_SITE_OTHER): Payer: Managed Care, Other (non HMO) | Admitting: Pharmacist

## 2018-12-26 DIAGNOSIS — I82629 Acute embolism and thrombosis of deep veins of unspecified upper extremity: Secondary | ICD-10-CM

## 2018-12-26 DIAGNOSIS — Z5181 Encounter for therapeutic drug level monitoring: Secondary | ICD-10-CM

## 2018-12-26 LAB — POCT INR: INR: 1.9 — AB (ref 2.0–3.0)

## 2019-01-04 ENCOUNTER — Telehealth: Payer: Self-pay

## 2019-01-04 NOTE — Telephone Encounter (Signed)

## 2019-01-07 ENCOUNTER — Telehealth: Payer: Self-pay | Admitting: Nurse Practitioner

## 2019-01-07 ENCOUNTER — Other Ambulatory Visit: Payer: Self-pay | Admitting: Neurosurgery

## 2019-01-07 ENCOUNTER — Telehealth: Payer: Self-pay | Admitting: *Deleted

## 2019-01-07 ENCOUNTER — Other Ambulatory Visit: Payer: Self-pay

## 2019-01-07 ENCOUNTER — Ambulatory Visit (INDEPENDENT_AMBULATORY_CARE_PROVIDER_SITE_OTHER): Payer: Managed Care, Other (non HMO) | Admitting: *Deleted

## 2019-01-07 DIAGNOSIS — M4722 Other spondylosis with radiculopathy, cervical region: Secondary | ICD-10-CM

## 2019-01-07 DIAGNOSIS — Z5181 Encounter for therapeutic drug level monitoring: Secondary | ICD-10-CM

## 2019-01-07 DIAGNOSIS — I82629 Acute embolism and thrombosis of deep veins of unspecified upper extremity: Secondary | ICD-10-CM | POA: Diagnosis not present

## 2019-01-07 LAB — POCT INR: INR: 2.6 (ref 2.0–3.0)

## 2019-01-07 NOTE — Telephone Encounter (Signed)
Patient with diagnosis of hypercoagulable state, Hx DVT, PAD on warfarin for anticoagulation.    Procedure: MYELOGRAM Date of procedure: TBD  CrCl 86ml/min Platelet count 251  Per office protocol, patient can hold warfarin for 5 days prior to procedure.   Patient WILL need bridging with Lovenox (enoxaparin) around procedure.  Please have patient let us know when date is set, will need at least 7-10 days lead time to be sure patient is seen and bridged accordingly.

## 2019-01-07 NOTE — Patient Instructions (Signed)
Description   Continue taking 1 tablet daily except 1/2 tablet on Mondays and Wednesdays. Recheck INR in 2 weeks. Call with any new medications any procedures or any concerns . [facility] [phone].

## 2019-01-07 NOTE — Telephone Encounter (Signed)
   [facility] Pre-operative Risk Assessment    Request for surgical clearance:  1. What type of surgery is being performed? MYELOGRAM  2. When is this surgery scheduled? TBD  3. What type of clearance is required (medical clearance vs. Pharmacy clearance to hold med vs. Both)? PHARM  4. Are there any medications that need to be held prior to surgery and how long? COUMADIN X 4 DAYS PRIOR  5. Practice name and name of physician performing surgery? [facility]  6. What is your office phone number [phone]   7.   What is your office fax number [phone]  8.   Anesthesia type (None, local, MAC, general) ? LOCAL   Julaine Hua 01/07/2019, 3:53 PM  _________________________________________________________________   (provider comments below)

## 2019-01-07 NOTE — Telephone Encounter (Signed)
Phone call to patient to verify medication list and allergies for myelogram procedure. Pt instructed to hold Warfarin (Coumadin) for 4 days prior to myelogram appointment time, pending approval and further recommendation from cardiologist Dr. Sherren Mocha. Pt also instructed to hold BC powders 3 days prior to appointment time. Pre and post procedure instructions reviewed with pt. Pt verbalized understanding. Thinner hold request faxed to cardiologist, awaiting response.

## 2019-01-08 ENCOUNTER — Telehealth: Payer: Self-pay | Admitting: Pharmacist

## 2019-01-08 NOTE — Telephone Encounter (Signed)
Patient called to let us know his procedure with [facility] is on 6/29. INR appointment on 6/15 was moved to 6/22 to better coordinate lovenox bridge.

## 2019-01-08 NOTE — Telephone Encounter (Signed)
Spoke with Banquete from [facility].  She confirms that they have spoken with the [name], that he is the one that informed them that he was on Coumadin. She is aware that he will need a Lovenox Bridge to come off of his Coumadin for the procedure and that our Coumadin Clinic needs a 7-10 day lead way in order to get the [name] in the office in a timely manner.  Angelita Ingles advised that she will call the [name] back, go over details and get him scheduled and that it is the [name]'s responsibility to call the Coumadin Clinic and let them know.

## 2019-01-08 NOTE — Telephone Encounter (Signed)
   Primary Cardiologist: Sherren Mocha, MD  Chart reviewed as part of pre-operative protocol coverage. Patient was contacted 01/08/2019 in reference to pre-operative risk assessment for pending surgery as outlined below.  [name] was last seen on 06/08/18 by Dr. Burt Anthony. He has h/o lower extremity PAD with hypercoagulable disorder with prior femoral/popliteal artery occlusion and renal infarction, tobacco, ETOH, GERD, HTN. Cath 2012 for chronic chest pain without significant CAD. 2D echo in 2012 with normal LVEF. He denies any interim changes to health or new cardiac symptoms. RCRI calculated at 0.4% indicating low risk of CV complications Therefore, based on ACC/AHA guidelines, the patient would be at acceptable risk for the planned procedure without further cardiovascular testing.   Pharmacist reviewed his chart regarding anticoagulation recommendations and states, "Per office protocol, patient can hold warfarin for 5 days prior to procedure. Patient WILL need bridging with Lovenox (enoxaparin) around procedure. Please have patient let us know when date is set, will need at least 7-10 days lead time to be sure patient is seen and bridged accordingly."  [name] says he has had difficulty coordinating communication with Dr. Lacy Duverney office and getting an answer as to when the procedure would occur. I asked him to call the office again today to discuss but he asked if our office could help him get information on the scheduling instructions and discussion about the procedure. I told him I think this will ultimately need to be coordinated by their office to get the details. I did offer that our office could call Dr. Lacy Duverney office to notify them the patient wanted them to call him, so will route to callback staff to help make this call today. He was appreciative of that. Callback, if Dr. Lacy Duverney office tells you what date the procedure is scheduled for, please let the Coumadin clinic know. If  they do not have it scheduled please ask them to call the Coumadin clinic as soon as it is scheduled so that they can help with the bridging. As above, the Coumadin clinic will need 7-10 days notice so that they can facilitate the Lovenox injections. I also told the patient to call us when he knew the date of the procedure as well just so everyone's on the same page. When he calls with this information it will need to be routed to Coumadin clinic.  I will route this recommendation to the requesting party via Epic fax function and remove from pre-op pool.  Please call with questions.  Charlie Pitter, PA-C 01/08/2019, 8:03 AM

## 2019-01-08 NOTE — Telephone Encounter (Signed)
Will cc to pharm so he is on their radar

## 2019-01-21 ENCOUNTER — Other Ambulatory Visit: Payer: Self-pay

## 2019-01-21 ENCOUNTER — Ambulatory Visit: Payer: Self-pay | Admitting: Internal Medicine

## 2019-01-21 ENCOUNTER — Telehealth: Payer: Self-pay

## 2019-01-21 ENCOUNTER — Telehealth: Payer: Self-pay | Admitting: Medical

## 2019-01-21 ENCOUNTER — Encounter: Payer: Self-pay | Admitting: Medical

## 2019-01-21 ENCOUNTER — Ambulatory Visit (INDEPENDENT_AMBULATORY_CARE_PROVIDER_SITE_OTHER): Payer: Managed Care, Other (non HMO) | Admitting: Medical

## 2019-01-21 VITALS — BP 118/80 | HR 103 | Temp 99.6°F | Wt 195.0 lb

## 2019-01-21 DIAGNOSIS — Z20822 Contact with and (suspected) exposure to covid-19: Secondary | ICD-10-CM

## 2019-01-21 DIAGNOSIS — J029 Acute pharyngitis, unspecified: Secondary | ICD-10-CM

## 2019-01-21 DIAGNOSIS — J40 Bronchitis, not specified as acute or chronic: Secondary | ICD-10-CM | POA: Diagnosis not present

## 2019-01-21 MED ORDER — AZITHROMYCIN 250 MG PO TABS: ORAL_TABLET | ORAL | 0 refills | Status: DC

## 2019-01-21 MED ORDER — BENZONATATE 100 MG PO CAPS: 100.0000 mg | ORAL_CAPSULE | Freq: Three times a day (TID) | ORAL | 0 refills | Status: DC | PRN

## 2019-01-21 NOTE — Telephone Encounter (Signed)
lmom for prescreen  

## 2019-01-21 NOTE — Patient Instructions (Signed)
Some of your symptoms sound bronchitis-like.  Also mention throat and sweating at night.  We will treat you for bronchitis with a azithromycin and this has some coverage for strep throat as well.  Recommend Tylenol for fever.  In light of COVID infection in the community and your recent travel, I do think it is a good idea to get you tested for COVID.  I am sending a message to the patient engagement center and they should contact you later today or tomorrow to get you scheduled.  Counseled patient to stay at home until both COVID test results come back negative and he is clinically improving.  Patient does not need work excuse presently.  Rx advisement given regarding side effects on INR with antibiotic.  I do think benefits outweigh risk.  If any bruising of skin during or after course of antibiotic use notify us.  Patient is getting INR done within the next 2 weeks prior to upcoming surgery.  Follow-up in 7 days or as needed.

## 2019-01-21 NOTE — Telephone Encounter (Signed)
I want to be tested for COVID-19.   I'm having a sore throat and a lot of coughing.  No fever.  No exposures to COVID=19 that he is aware of.    I warm transferred his call to Dr. Ethel Rana office to be scheduled for a virtual visit.  I sent my notes to the office.  Reason for Disposition . [1] COVID-19 infection suspected by caller or triager AND [2] mild symptoms (cough, fever, or others) AND [8] no complications or SOB  Answer Assessment - Initial Assessment Questions 1. COVID-19 DIAGNOSIS: "Who made your Coronavirus (COVID-19) diagnosis?" "Was it confirmed by a positive lab test?" If not diagnosed by a HCP, ask "Are there lots of cases (community spread) where you live?" (See public health department website, if unsure)     *No Answer* 2. ONSET: "When did the COVID-19 symptoms start?"      My sore throat and cough started Saturday.   No fever. 3. WORST SYMPTOM: "What is your worst symptom?" (e.g., cough, fever, shortness of breath, muscle aches)     Sore throat.  It's better this morning.  I am a smoker 4. COUGH: "Do you have a cough?" If so, ask: "How bad is the cough?"       Yes.   I'm coughing up white mucus which is normal for me. 5. FEVER: "Do you have a fever?" If so, ask: "What is your temperature, how was it measured, and when did it start?"     Checked it now 99.6 orally   Last night 98.7 before bed.   Last night he was sweating and having chills. Per wife. 6. RESPIRATORY STATUS: "Describe your breathing?" (e.g., shortness of breath, wheezing, unable to speak)      No shortness of breath 7. BETTER-SAME-WORSE: "Are you getting better, staying the same or getting worse compared to yesterday?"  If getting worse, ask, "In what way?"     Sore throat is better but still have the other symptoms. 8. HIGH RISK DISEASE: "Do you have any chronic medical problems?" (e.g., asthma, heart or lung disease, weak immune system, etc.)     Smoker.  I take warfarin DVT years ago. 9. PREGNANCY: "Is  there any chance you are pregnant?" "When was your last menstrual period?"     N/A 10. OTHER SYMPTOMS: "Do you have any other symptoms?"  (e.g., chills, fatigue, headache, loss of smell or taste, muscle pain, sore throat)       Decreased appetite,  Only other symptom.  Protocols used: CORONAVIRUS (COVID-19) DIAGNOSED OR SUSPECTED-A-AH

## 2019-01-21 NOTE — Telephone Encounter (Signed)
Referred for testing by PCP.  Orders placed and patient scheduled. [name]

## 2019-01-21 NOTE — Telephone Encounter (Signed)
Virtual visit scheduled.  

## 2019-01-21 NOTE — Telephone Encounter (Signed)
Patient has recent productive cough for 2 days with sore throat, subjective fever and sweating at night.  Recently out in about the community and did attend a funeral out of town over the past 2 weeks.  He admits that he uses his mask about 50% of the time when he is out.  Would like patient to have COVID nasal  test today or tomorrow.  Please set him up at [facility].  Patient clinically stable and does not need to be seen in the emergency department.

## 2019-01-21 NOTE — Progress Notes (Signed)
Subjective:    Patient ID: [name], male    DOB: May 01, 1961, 58 y.o.   MRN: [id number]  HPI  Virtual Visit via Video Note  I connected with [name] on 01/21/19 at  1:20 PM EDT by a video enabled telemedicine application and verified that I am speaking with the correct person using two identifiers.  Location: Patient: home Provider: office     I discussed the limitations of evaluation and management by telemedicine and the availability of in person appointments. The patient expressed understanding and agreed to proceed.  History of Present Illness: Pt states he had some cough recently and mild st x 2 days. Some sweating last night. T max has been about 99.6.  The cough is productive some. No wheezing or sob. Some mild loose stools but not constant or daily. Pt has been in and out of house. Attended funeral in [place] within past 2 weeks. He does wear mask about 50% of the time.  Pt states heart burn and takes nexium sometime swill regurge food. Recently heart burn has been controlled.  Pt has not been working recently due to back pain and bulging disk. Pt been out of work since March, 2020.  Pt states his inr was in range recently. He will get recheck at end of the month before a procedure.     Observations/Objective: General-no acute distress, pleasant, oriented. Lungs- on inspection lungs appear unlabored. Neck- no tracheal deviation or jvd on inspection. Neuro- gross motor function appears intact. heent- video but voice sounds hoarse.  Assessment and Plan: Some of your symptoms sound bronchitis-like.  Also mention throat and sweating at night.  We will treat you for bronchitis with a azithromycin and this has some coverage for strep throat as well.  Recommend Tylenol for fever.  In light of COVID infection in the community and your recent travel, I do think it is a good idea to get you tested for COVID.  I am sending a message to the patient engagement  center and they should contact you later today or tomorrow to get you scheduled.  Counseled patient to stay at home until both COVID test results come back negative and he is clinically improving.  Patient does not need work excuse presently.  Rx advisement given regarding side effects on INR with antibiotic.  I do think benefits outweigh risk.  If any bruising of skin during or after course of antibiotic use notify us.  Patient is getting INR done within the next 2 weeks prior to upcoming surgery.  Follow-up in 7 days or as needed.  Follow Up Instructions:    I discussed the assessment and treatment plan with the patient. The patient was provided an opportunity to ask questions and all were answered. The patient agreed with the plan and demonstrated an understanding of the instructions.   The patient was advised to call back or seek an in-person evaluation if the symptoms worsen or if the condition fails to improve as anticipated.  I provided 15  minutes of non-face-to-face time during this encounter.   Mackie Pai, PA-C   Review of Systems  Constitutional: Positive for diaphoresis and fever. Negative for chills and fatigue.  HENT: Positive for sore throat. Negative for congestion, facial swelling, sinus pressure and sinus pain.   Respiratory: Positive for cough. Negative for chest tightness, shortness of breath and wheezing.   Gastrointestinal: Negative for abdominal pain.  Musculoskeletal: Negative for myalgias.  Neurological: Negative for dizziness, weakness, light-headedness  and headaches.  Hematological: Negative for adenopathy. Does not bruise/bleed easily.  Psychiatric/Behavioral: Negative for behavioral problems and confusion.   Past Medical History:  Diagnosis Date  . Chest pain    stres test neg x 2, cath 5/12; minimal LAD irregs; no obs CAD; normal LVF  . Clotting disorder ([place])   . GERD (gastroesophageal reflux disease)   . History of DVT of lower extremity    on  chronic coumadin  . Hypertension   . Internal hemorrhoids    Cscope 2007  . PAD (peripheral artery disease) ([place])    a. left leg ischemia tx wtih embolectomy of left fem, pop, tib arteries with Dr. Scot Dock - 08/2006;  b. known occlusion of right pop with collats - med Rx;  c.ABI's 8/09: R 1.0; L 0.91   . Polysubstance abuse ([place])   . Renal infarct [facility])    R     Social History   Socioeconomic History  . Marital status: Married    Spouse name: Not on file  . Number of children: 2  . Years of education: Not on file  . Highest education level: Not on file  Occupational History  . Occupation: Editor, commissioning, Geophysicist/field seismologist   Social Needs  . Financial resource strain: Not on file  . Food insecurity    Worry: Not on file    Inability: Not on file  . Transportation needs    Medical: Not on file    Non-medical: Not on file  Tobacco Use  . Smoking status: Current Every Day Smoker    Packs/day: 0.00    Years: 30.00    Pack years: 0.00    Types: Cigarettes  . Smokeless tobacco: Never Used  . Tobacco comment: 1 to 1.5 ppd   Substance and Sexual Activity  . Alcohol use: Yes    Comment: ETOH most days ," 2- 40 oz beers , and a pint of liquor  . Drug use: Yes    Types: Marijuana    Comment: 10/25/16  . Sexual activity: Not on file  Lifestyle  . Physical activity    Days per week: Not on file    Minutes per session: Not on file  . Stress: Not on file  Relationships  . Social Herbalist on phone: Not on file    Gets together: Not on file    Attends religious service: Not on file    Active member of club or organization: Not on file    Attends meetings of clubs or organizations: Not on file    Relationship status: Not on file  . Intimate partner violence    Fear of current or ex partner: Not on file    Emotionally abused: Not on file    Physically abused: Not on file    Forced sexual activity: Not on file  Other Topics Concern  . Not on file  Social History Narrative   1 child  lives w/ them    Past Surgical History:  Procedure Laterality Date  . CHOLECYSTECTOMY    . COLONOSCOPY    . left leg blood clot removal 2009  2009  . TONSILLECTOMY    . WRIST SURGERY      Family History  Problem Relation Age of Onset  . Hypertension Mother   . Breast cancer Mother   . Hypertension Father   . CAD Father   . Diabetes Father   . Lung cancer Maternal Uncle   . Pancreatic cancer Brother   .  Prostate cancer Maternal Uncle   . Heart disease Paternal Grandmother   . Colon cancer Neg Hx     Allergies  Allergen Reactions  . Pravastatin Itching  . Simvastatin Itching    Current Outpatient Medications on File Prior to Visit  Medication Sig Dispense Refill  . Aspirin-Salicylamide-Caffeine (BC HEADACHE POWDER PO) Take by mouth.    . esomeprazole (NEXIUM) 10 MG packet Take 10 mg by mouth daily before breakfast.    . gabapentin (NEURONTIN) 300 MG capsule Take 300 mg by mouth 3 (three) times daily.    [name] HYDROcodone-acetaminophen (NORCO/VICODIN) 5-325 MG tablet Take 1 tablet by mouth every 6 (six) hours as needed for moderate pain.    [name] lisinopril (PRINIVIL,ZESTRIL) 10 MG tablet Take 1 tablet (10 mg total) by mouth daily. 90 tablet 3  . methocarbamol (ROBAXIN) 500 MG tablet Take 1 tablet (500 mg total) by mouth 2 (two) times daily. 20 tablet 0  . pantoprazole (PROTONIX) 40 MG tablet Take 1 tablet (40 mg total) daily by mouth. 90 tablet 3  . ranitidine (ZANTAC) 150 MG tablet Take 1 tablet (150 mg total) by mouth at bedtime. 30 tablet 0  . warfarin (COUMADIN) 10 MG tablet Take 1/2 to 1 tablet daily as directed by Coumadin clinic 90 tablet 0   Current Facility-Administered Medications on File Prior to Visit  Medication Dose Route Frequency Provider Last Rate Last Dose  . 0.9 %  sodium chloride infusion  500 mL Intravenous Continuous Ladene Artist, MD        BP 118/80   Pulse (!) 103   Temp 99.6 F (37.6 C) (Oral)   Wt 195 lb (88.5 kg)   BMI 25.73 kg/m        Objective:   Physical Exam        Assessment & Plan:

## 2019-01-22 LAB — NOVEL CORONAVIRUS, NAA: SARS-CoV-2, NAA: NOT DETECTED

## 2019-01-25 ENCOUNTER — Other Ambulatory Visit: Payer: Self-pay

## 2019-01-25 ENCOUNTER — Ambulatory Visit: Payer: Managed Care, Other (non HMO) | Admitting: Internal Medicine

## 2019-01-30 ENCOUNTER — Other Ambulatory Visit: Payer: Self-pay

## 2019-01-30 ENCOUNTER — Ambulatory Visit (INDEPENDENT_AMBULATORY_CARE_PROVIDER_SITE_OTHER): Payer: Managed Care, Other (non HMO) | Admitting: *Deleted

## 2019-01-30 DIAGNOSIS — I82629 Acute embolism and thrombosis of deep veins of unspecified upper extremity: Secondary | ICD-10-CM

## 2019-01-30 DIAGNOSIS — Z5181 Encounter for therapeutic drug level monitoring: Secondary | ICD-10-CM

## 2019-01-30 LAB — POCT INR: INR: 5 — AB (ref 2.0–3.0)

## 2019-01-30 MED ORDER — ENOXAPARIN SODIUM 150 MG/ML ~~LOC~~ SOLN: 150.0000 mg | SUBCUTANEOUS | 1 refills | Status: DC

## 2019-01-30 NOTE — Patient Instructions (Addendum)
  01-31-19: No Coumadin and No Lovenox.  02-01-19: No Coumadin and No Lovenox.   02-02-19: Inject Lovenox 150mg  in the fatty tissue at 8 pm No Coumadin.  02-03-19:  No Coumadin and No lovenox.  02-04-19: Procedure Day - No Lovenox - Resume Coumadin in the evening or as directed by doctor.  02-05-19: Resume Lovenox inject in the fatty tissue at 8 am and take Coumadin.  02-06-19: Inject Lovenox in the fatty tissue at 8 am and take Coumadin.  02-07-19: Inject Lovenox in the fatty tissue at 8 am and take Coumadin.  02-08-19: Inject Lovenox in the fatty tissue at 8 am and take Coumadin.  02-09-19: Inject Lovenox in the fatty tissue at 8am and take Coumadin.  02-10-19: Inject Lovenox in the fatty tissue at 8am and take Coumadin.  02-11-19: Inject Lovenox in the fatty tissue at 8am and report to Coumadin appt.

## 2019-02-01 ENCOUNTER — Telehealth: Payer: Self-pay | Admitting: Cardiovascular Disease

## 2019-02-01 NOTE — Telephone Encounter (Signed)
Patient scheduled for Monday at 7:45 for INR check prior to his procedure Monday at [facility]

## 2019-02-01 NOTE — Telephone Encounter (Signed)
New message:    Patient calling stating that he needs to get his INR check before Monday please call patient.

## 2019-02-04 ENCOUNTER — Ambulatory Visit
Admission: RE | Admit: 2019-02-04 | Discharge: 2019-02-04 | Disposition: A | Discharge status: Home / Self Care | Payer: Managed Care, Other (non HMO) | Source: Ambulatory Visit | Attending: Neurosurgery | Admitting: Neurosurgery

## 2019-02-04 ENCOUNTER — Other Ambulatory Visit: Payer: Self-pay

## 2019-02-04 ENCOUNTER — Telehealth: Payer: Self-pay

## 2019-02-04 ENCOUNTER — Ambulatory Visit (INDEPENDENT_AMBULATORY_CARE_PROVIDER_SITE_OTHER): Payer: Managed Care, Other (non HMO) | Admitting: *Deleted

## 2019-02-04 DIAGNOSIS — Z5181 Encounter for therapeutic drug level monitoring: Secondary | ICD-10-CM

## 2019-02-04 DIAGNOSIS — M4722 Other spondylosis with radiculopathy, cervical region: Secondary | ICD-10-CM

## 2019-02-04 LAB — POCT INR: INR: 1.2 — AB (ref 2.0–3.0)

## 2019-02-04 MED ORDER — IOPAMIDOL (ISOVUE-M 300) INJECTION 61%
10.0000 mL | Freq: Once | INTRAMUSCULAR | Status: AC | PRN
  Administered 2019-02-04: 10:00:00 10 mL via INTRATHECAL

## 2019-02-04 MED ORDER — ONDANSETRON HCL 4 MG/2ML IJ SOLN: 4.0000 mg | Freq: Four times a day (QID) | INTRAMUSCULAR | Status: DC | PRN

## 2019-02-04 MED ORDER — DIAZEPAM 5 MG PO TABS
5.0000 mg | ORAL_TABLET | Freq: Once | ORAL | Status: AC
  Administered 2019-02-04: 09:00:00 5 mg via ORAL

## 2019-02-04 NOTE — Progress Notes (Signed)
Pt reports being off of coumadin for 4 days and last lovenox injection was Saturday.

## 2019-02-04 NOTE — Discharge Instructions (Signed)

## 2019-02-04 NOTE — Telephone Encounter (Signed)

## 2019-02-04 NOTE — Patient Instructions (Addendum)
Description   Please refer to [facility] instructions.  Normal dose: 1 tablet daily except 1/2 tablet on Mondays and Wednesdays. Recheck INR in 1 week after procedure. Call with any new medications any procedures or any concerns. Coumadin Clinic [phone].     01-31-19: No Coumadin and No Lovenox.  02-01-19: No Coumadin and No Lovenox.   02-02-19: Inject Lovenox 150mg  in the fatty tissue at 8 pm No Coumadin.  02-03-19:  No Coumadin and No lovenox.  02-04-19: Procedure Day - No Lovenox - Resume Coumadin in the evening or as directed by doctor.  02-05-19: Resume Lovenox inject in the fatty tissue at 8 am and take Coumadin.  02-06-19: Inject Lovenox in the fatty tissue at 8 am and take Coumadin.  02-07-19: Inject Lovenox in the fatty tissue at 8 am and take Coumadin.  02-08-19: Inject Lovenox in the fatty tissue at 8 am and take Coumadin.  02-09-19: Inject Lovenox in the fatty tissue at 8am and take Coumadin.  02-10-19: Inject Lovenox in the fatty tissue at 8am and take Coumadin.  02-11-19: Inject Lovenox in the fatty tissue at 8am and report to Coumadin appt.

## 2019-02-11 ENCOUNTER — Ambulatory Visit (INDEPENDENT_AMBULATORY_CARE_PROVIDER_SITE_OTHER): Payer: Self-pay | Admitting: *Deleted

## 2019-02-11 ENCOUNTER — Other Ambulatory Visit: Payer: Self-pay

## 2019-02-11 DIAGNOSIS — Z5181 Encounter for therapeutic drug level monitoring: Secondary | ICD-10-CM

## 2019-02-11 LAB — POCT INR: INR: 2.3 (ref 2.0–3.0)

## 2019-02-11 NOTE — Patient Instructions (Signed)
Description   Continue taking 1 tablet daily except 1/2 tablet on Mondays and Wednesdays. Recheck INR in 2 weeks.  Call with any new medications any procedures or any concerns. Coumadin Clinic [phone].

## 2019-02-27 ENCOUNTER — Telehealth: Payer: Self-pay

## 2019-02-27 NOTE — Telephone Encounter (Signed)

## 2019-03-07 ENCOUNTER — Ambulatory Visit (INDEPENDENT_AMBULATORY_CARE_PROVIDER_SITE_OTHER): Payer: Self-pay | Admitting: *Deleted

## 2019-03-07 ENCOUNTER — Other Ambulatory Visit: Payer: Self-pay

## 2019-03-07 DIAGNOSIS — Z5181 Encounter for therapeutic drug level monitoring: Secondary | ICD-10-CM

## 2019-03-07 DIAGNOSIS — Z7901 Long term (current) use of anticoagulants: Secondary | ICD-10-CM

## 2019-03-07 LAB — POCT INR: INR: 3.8 — AB (ref 2.0–3.0)

## 2019-03-07 NOTE — Patient Instructions (Signed)
Description   Hold tomorrow, Continue taking 1 tablet daily except 1/2 tablet on Mondays and Wednesdays. Recheck INR in 2 weeks.  Call with any new medications any procedures or any concerns. Coumadin Clinic [phone].

## 2019-03-08 ENCOUNTER — Encounter: Payer: Self-pay | Admitting: Internal Medicine

## 2019-03-15 DIAGNOSIS — E860 Dehydration: Secondary | ICD-10-CM | POA: Diagnosis not present

## 2019-03-15 DIAGNOSIS — R55 Syncope and collapse: Secondary | ICD-10-CM | POA: Diagnosis not present

## 2019-03-15 DIAGNOSIS — I959 Hypotension, unspecified: Secondary | ICD-10-CM | POA: Diagnosis not present

## 2019-03-21 ENCOUNTER — Other Ambulatory Visit: Payer: Self-pay

## 2019-03-21 ENCOUNTER — Ambulatory Visit (INDEPENDENT_AMBULATORY_CARE_PROVIDER_SITE_OTHER): Payer: Self-pay | Admitting: *Deleted

## 2019-03-21 DIAGNOSIS — Z7901 Long term (current) use of anticoagulants: Secondary | ICD-10-CM

## 2019-03-21 DIAGNOSIS — Z5181 Encounter for therapeutic drug level monitoring: Secondary | ICD-10-CM

## 2019-03-21 LAB — POCT INR: INR: 2.7 (ref 2.0–3.0)

## 2019-03-21 NOTE — Patient Instructions (Signed)
Description   Continue taking 1 tablet daily except 1/2 tablet on Mondays and Wednesdays. Recheck INR in 3 weeks.  Call with any new medications any procedures or any concerns. Coumadin Clinic [phone].

## 2019-04-11 ENCOUNTER — Ambulatory Visit: Payer: Self-pay | Admitting: *Deleted

## 2019-04-11 ENCOUNTER — Other Ambulatory Visit: Payer: Self-pay | Admitting: *Deleted

## 2019-04-11 MED ORDER — WARFARIN SODIUM 10 MG PO TABS: ORAL_TABLET | ORAL | 0 refills | Status: DC

## 2019-04-11 NOTE — Telephone Encounter (Signed)
FYI

## 2019-04-11 NOTE — Telephone Encounter (Signed)
The patient was reluctant to go to the ER, I did talk to him, explained him  why,  nevertheless since he remained reluctant I offered him a office visit but at the end he agreed for ER evaluation. (He is concerned about the cost of medical care).

## 2019-04-11 NOTE — Telephone Encounter (Signed)
Pt called with complaints of " blood in stool, on episode around 2 weeks ago"; he has not any other episodes; he also complains of intermittent abdominal pain for the past month"; this happened 2 weeks ago; the pt says that he has not been eating well, and he is having difficulty having a bowel movement; the pt says that he vomits every morning (mostly phlegm); the pt says that he feels like he is drinking too much; he drinks 40 oz of beer and a pint of hard liquor daily (for the past 6 months); the pt takes Coumadin for 10 years due to DVT; he also has lower stomach pain which is worse when he coughs ("feels like blowing butt hole out, and can feel it in his groin"); the pt says that he smokes 1 pack of cigarettes a day; recommendations made per nurse triage protocol; spoke with Mel Almond, and per Dr Larose Kells the pt should go to the ED for evaluation today; he verbalized understanding but would like to be seen in the office; pt transferred to [facility] for final disposition.  Reason for Disposition . [1] MODERATE pain (e.g., interferes with normal activities) AND [2] pain comes and goes (cramps) AND [3] present > 24 hours  (Exception: pain with Vomiting or Diarrhea - see that Guideline)  Answer Assessment - Initial Assessment Questions 1. LOCATION: "Where does it hurt?"      Lower abdomen 2. RADIATION: "Does the pain shoot anywhere else?" (e.g., chest, back)     Intermittent mid left chest pain for the past couple of days 3. ONSET: "When did the pain begin?" (Minutes, hours or days ago)     A month ago 4. SUDDEN: "Gradual or sudden onset?"     suddenly 5. PATTERN "Does the pain come and go, or is it constant?"    - If constant: "Is it getting better, staying the same, or worsening?"      (Note: Constant means the pain never goes away completely; most serious pain is constant and it progresses)     - If intermittent: "How long does it last?" "Do you have pain now?"     (Note: Intermittent means the pain goes  away completely between bouts)     intermittent 6. SEVERITY: "How bad is the pain?"  (e.g., Scale 1-10; mild, moderate, or severe)    - MILD (1-3): doesn't interfere with normal activities, abdomen soft and not tender to touch     - MODERATE (4-7): interferes with normal activities or awakens from sleep, tender to touch     - SEVERE (8-10): excruciating pain, doubled over, unable to do any normal activities       5 out of 10 7. RECURRENT SYMPTOM: "Have you ever had this type of abdominal pain before?" If so, ask: "When was the last time?" and "What happened that time?"      no 8. CAUSE: "What do you think is causing the abdominal pain?"     Drinking too much? 9. RELIEVING/AGGRAVATING FACTORS: "What makes it better or worse?" (e.g., movement, antacids, bowel movement)    Worse when cough 10. OTHER SYMPTOMS: "Has there been any vomiting, diarrhea, constipation, or urine problems?"      Vomiting, hard time having a BM (not constipated)  Protocols used: ABDOMINAL PAIN - MALE-A-AH

## 2019-05-09 ENCOUNTER — Encounter: Payer: Self-pay | Admitting: Internal Medicine

## 2019-05-09 ENCOUNTER — Ambulatory Visit (INDEPENDENT_AMBULATORY_CARE_PROVIDER_SITE_OTHER): Payer: BC Managed Care – PPO | Admitting: Internal Medicine

## 2019-05-09 ENCOUNTER — Other Ambulatory Visit: Payer: Self-pay

## 2019-05-09 DIAGNOSIS — R1909 Other intra-abdominal and pelvic swelling, mass and lump: Secondary | ICD-10-CM

## 2019-05-09 DIAGNOSIS — R1032 Left lower quadrant pain: Secondary | ICD-10-CM

## 2019-05-09 NOTE — Progress Notes (Signed)
Subjective:    Patient ID: [name], male    DOB: 1961-06-20, 58 y.o.   MRN: [id number]  DOS:  05/09/2019 Type of visit - description: Attempted  to make this a video visit, due to technical difficulties from the patient side it was not possible  thus we proceeded with a Virtual Visit via Telephone    I connected with@   by telephone and verified that I am speaking with the correct person using two identifiers.  THIS ENCOUNTER IS A VIRTUAL VISIT DUE TO COVID-19 - PATIENT WAS NOT SEEN IN THE OFFICE. PATIENT HAS CONSENTED TO VIRTUAL VISIT / TELEMEDICINE VISIT   Location of patient: home  Location of provider: office  I discussed the limitations, risks, security and privacy concerns of performing an evaluation and management service by telephone and the availability of in person appointments. I also discussed with the patient that there may be a patient responsible charge related to this service. The patient expressed understanding and agreed to proceed.   History of Present Illness:  Acute The patient has 2 problems  At the left groin, has on and off burning sensation without a rash. Typically happens when he is standing in the same position for prolonged period of time.  Decrease if he moves around a little. No associated mass except for BB-like lump on the area. He has take Advil for it.  Also, has a lump@ the right groin, the size of a marble.  That has been there for a while, no pain, he is not able to push it back in.  He is now working at [employer], standing for long hours, thinks that that is triggering the pain on the left groin.   Review of Systems Denies fever chills Has GI symptoms, ongoing.  No change in years.  Past Medical History:  Diagnosis Date  . Chest pain    stres test neg x 2, cath 5/12; minimal LAD irregs; no obs CAD; normal LVF  . Clotting disorder ([place])   . GERD (gastroesophageal reflux disease)   . History of DVT of lower extremity    on  chronic coumadin  . Hypertension   . Internal hemorrhoids    Cscope 2007  . PAD (peripheral artery disease) ([place])    a. left leg ischemia tx wtih embolectomy of left fem, pop, tib arteries with Dr. Scot Dock - 08/2006;  b. known occlusion of right pop with collats - med Rx;  c.ABI's 8/09: R 1.0; L 0.91   . Polysubstance abuse ([place])   . Renal infarct [facility])    R    Past Surgical History:  Procedure Laterality Date  . CHOLECYSTECTOMY    . COLONOSCOPY    . left leg blood clot removal 2009  2009  . TONSILLECTOMY    . WRIST SURGERY      Social History   Socioeconomic History  . Marital status: Married    Spouse name: Not on file  . Number of children: 2  . Years of education: Not on file  . Highest education level: Not on file  Occupational History  . Occupation: Editor, commissioning, Geophysicist/field seismologist   Social Needs  . Financial resource strain: Not on file  . Food insecurity    Worry: Not on file    Inability: Not on file  . Transportation needs    Medical: Not on file    Non-medical: Not on file  Tobacco Use  . Smoking status: Current Every Day Smoker    Packs/day:  0.00    Years: 30.00    Pack years: 0.00    Types: Cigarettes  . Smokeless tobacco: Never Used  . Tobacco comment: 1 to 1.5 ppd   Substance and Sexual Activity  . Alcohol use: Yes    Comment: ETOH most days ," 2- 40 oz beers , and a pint of liquor  . Drug use: Yes    Types: Marijuana    Comment: 10/25/16  . Sexual activity: Not on file  Lifestyle  . Physical activity    Days per week: Not on file    Minutes per session: Not on file  . Stress: Not on file  Relationships  . Social Herbalist on phone: Not on file    Gets together: Not on file    Attends religious service: Not on file    Active member of club or organization: Not on file    Attends meetings of clubs or organizations: Not on file    Relationship status: Not on file  . Intimate partner violence    Fear of current or ex partner: Not on file     Emotionally abused: Not on file    Physically abused: Not on file    Forced sexual activity: Not on file  Other Topics Concern  . Not on file  Social History Narrative   1 child lives w/ them      Allergies as of 05/09/2019      Reactions   Pravastatin Itching   Simvastatin Itching      Medication List       Accurate as of May 09, 2019  3:43 PM. If you have any questions, ask your nurse or doctor.        azithromycin 250 MG tablet Commonly known as: ZITHROMAX Take 2 tablets by mouth on day 1, followed by 1 tablet by mouth daily for 4 days.   BC HEADACHE POWDER PO Take by mouth.   benzonatate 100 MG capsule Commonly known as: TESSALON Take 1 capsule (100 mg total) by mouth 3 (three) times daily as needed for cough.   enoxaparin 150 MG/ML injection Commonly known as: Lovenox Inject 1 mL (150 mg total) into the skin daily.   esomeprazole 10 MG packet Commonly known as: NEXIUM Take 10 mg by mouth daily before breakfast.   gabapentin 300 MG capsule Commonly known as: NEURONTIN Take 300 mg by mouth 3 (three) times daily.   HYDROcodone-acetaminophen 5-325 MG tablet Commonly known as: NORCO/VICODIN Take 1 tablet by mouth every 6 (six) hours as needed for moderate pain.   lisinopril 10 MG tablet Commonly known as: ZESTRIL Take 1 tablet (10 mg total) by mouth daily.   methocarbamol 500 MG tablet Commonly known as: ROBAXIN Take 1 tablet (500 mg total) by mouth 2 (two) times daily.   pantoprazole 40 MG tablet Commonly known as: PROTONIX Take 1 tablet (40 mg total) daily by mouth.   ranitidine 150 MG tablet Commonly known as: ZANTAC Take 1 tablet (150 mg total) by mouth at bedtime.   warfarin 10 MG tablet Commonly known as: COUMADIN Take as directed by the anticoagulation clinic. If you are unsure how to take this medication, talk to your nurse or doctor. Original instructions: Take 1/2 to 1 tablet daily or as directed by Coumadin clinic            Objective:   Physical Exam There were no vitals taken for this visit.    This is a Hospital doctor  phone visit, he sounded well, we were unable to connect through video   Assessment     Assessment HTN Hyperlipidemia- statin intolerant  GERD -EGD 2002 and 2015 (due to nausea) : Gastritis, duodenal inflammation., bx benign, no H. pylori Chest pain:  stres test neg x 2, cath 5/12; minimal LAD irregs; no obs CAD; normal LVF; stress test: 01/16/2014: Low risk study: DVT, leg : on chronic Coumadin (per cardiology) H/o Renal infarct >>> R PAD:  left leg ischemia tx wtih embolectomy of left fem, pop, tib arteries with Dr. Scot Dock - 08/2006;  b. known occlusion of right pop with collats - med Rx;  c.ABI's 8/09: R 1.0; L 0.91  Polysubstance abuse Smoker    PLAN: Pain, left groin. Mass right groin. Unfortunately, I am not able to assess his symptoms through a virtual visit.  We will call him and get him in the office next week. Call if symptoms increase Avoid Advil, okay to take Tylenol for pain.  He is on Coumadin. He verbalized understanding    I discussed the assessment and treatment plan with the patient. The patient was provided an opportunity to ask questions and all were answered. The patient agreed with the plan and demonstrated an understanding of the instructions.   The patient was advised to call back or seek an in-person evaluation if the symptoms worsen or if the condition fails to improve as anticipated.  I provided 15 minutes of non-face-to-face time during this encounter.  Kathlene November, MD

## 2019-05-10 NOTE — Assessment & Plan Note (Signed)
Pain, left groin. Mass right groin. Unfortunately, I am not able to assess his symptoms through a virtual visit.  We will call him and get him in the office next week. Call if symptoms increase Avoid Advil, okay to take Tylenol for pain.  He is on Coumadin. He verbalized understanding

## 2019-05-13 ENCOUNTER — Encounter: Payer: Self-pay | Admitting: Internal Medicine

## 2019-05-13 ENCOUNTER — Other Ambulatory Visit: Payer: Self-pay

## 2019-05-13 ENCOUNTER — Ambulatory Visit (INDEPENDENT_AMBULATORY_CARE_PROVIDER_SITE_OTHER): Payer: Self-pay | Admitting: Internal Medicine

## 2019-05-13 VITALS — BP 142/101 | HR 97 | Temp 97.6°F | Resp 16 | Ht 73.0 in | Wt 200.1 lb

## 2019-05-13 DIAGNOSIS — K4090 Unilateral inguinal hernia, without obstruction or gangrene, not specified as recurrent: Secondary | ICD-10-CM

## 2019-05-13 NOTE — Patient Instructions (Signed)
Inguinal Hernia, Adult An inguinal hernia is when fat or your intestines push through a weak spot in a muscle where your leg meets your lower belly (groin). This causes a rounded lump (bulge). This kind of hernia could also be:  In your scrotum, if you are male.  In folds of skin around your vagina, if you are male. There are three types of inguinal hernias. These include:  Hernias that can be pushed back into the belly (are reducible). This type rarely causes pain.  Hernias that cannot be pushed back into the belly (are incarcerated).  Hernias that cannot be pushed back into the belly and lose their blood supply (are strangulated). This type needs emergency surgery. If you do not have symptoms, you may not need treatment. If you have symptoms or a large hernia, you may need surgery. Follow these instructions at home: Lifestyle  Do these things if told by your doctor so you do not have trouble pooping (constipation): ? Drink enough fluid to keep your pee (urine) pale yellow. ? Eat foods that have a lot of fiber. These include fresh fruits and vegetables, whole grains, and beans. ? Limit foods that are high in fat and processed sugars. These include foods that are fried or sweet. ? Take medicine for trouble pooping.  Avoid lifting heavy objects.  Avoid standing for long amounts of time.  Do not use any products that contain nicotine or tobacco. These include cigarettes and e-cigarettes. If you need help quitting, ask your doctor.  Stay at a healthy weight. General instructions  You may try to push your hernia in by very gently pressing on it when you are lying down. Do not try to force the bulge back in if it will not push in easily.  Watch your hernia for any changes in shape, size, or color. Tell your doctor if you see any changes.  Take over-the-counter and prescription medicines only as told by your doctor.  Keep all follow-up visits as told by your doctor. This is  important. Contact a doctor if:  You have a fever.  You have new symptoms.  Your symptoms get worse. Get help right away if:  The area where your leg meets your lower belly has: ? Pain that gets worse suddenly. ? A bulge that gets bigger suddenly, and it does not get smaller after that. ? A bulge that turns red or purple. ? A bulge that is painful when you touch it.  You are a man, and your scrotum: ? Suddenly feels painful. ? Suddenly changes in size.  You cannot push the hernia in by very gently pressing on it when you are lying down. Do not try to force the bulge back in if it will not push in easily.  You feel sick to your stomach (nauseous), and that feeling does not go away.  You throw up (vomit), and that keeps happening.  You have a fast heartbeat.  You cannot poop (have a bowel movement) or pass gas. These symptoms may be an emergency. Do not wait to see if the symptoms will go away. Get medical help right away. Call your local emergency services (911 in the [place] Summary  An inguinal hernia is when fat or your intestines push through a weak spot in a muscle where your leg meets your lower belly (groin). This causes a rounded lump (bulge).  If you do not have symptoms, you may not need treatment. If you have symptoms or a large hernia, you   may need surgery.  Avoid lifting heavy objects. Also avoid standing for long amounts of time.  Do not try to force the bulge back in if it will not push in easily. This information is not intended to replace advice given to you by your health care provider. Make sure you discuss any questions you have with your health care provider. Document Released: 08/25/2006 Document Revised: 08/26/2017 Document Reviewed: 04/26/2017 Elsevier Patient Education  2020 Elsevier Inc.  

## 2019-05-13 NOTE — Progress Notes (Signed)
Subjective:    Patient ID: [name], male    DOB: Apr 30, 1961, 58 y.o.   MRN: [id number]  DOS:  05/13/2019 Type of visit - description: Acute See last visit, problems have not  changed. Continue with severe pain at the left side of the groin with certain movements.   Review of Systems  No fever chills Occasional nausea No diarrhea Past Medical History:  Diagnosis Date  . Chest pain    stres test neg x 2, cath 5/12; minimal LAD irregs; no obs CAD; normal LVF  . Clotting disorder ([place])   . GERD (gastroesophageal reflux disease)   . History of DVT of lower extremity    on chronic coumadin  . Hypertension   . Internal hemorrhoids    Cscope 2007  . PAD (peripheral artery disease) ([place])    a. left leg ischemia tx wtih embolectomy of left fem, pop, tib arteries with Dr. Scot Dock - 08/2006;  b. known occlusion of right pop with collats - med Rx;  c.ABI's 8/09: R 1.0; L 0.91   . Polysubstance abuse ([place])   . Renal infarct [facility])    R    Past Surgical History:  Procedure Laterality Date  . CHOLECYSTECTOMY    . COLONOSCOPY    . left leg blood clot removal 2009  2009  . TONSILLECTOMY    . WRIST SURGERY      Social History   Socioeconomic History  . Marital status: Married    Spouse name: Not on file  . Number of children: 2  . Years of education: Not on file  . Highest education level: Not on file  Occupational History  . Occupation: Editor, commissioning, Geophysicist/field seismologist   Social Needs  . Financial resource strain: Not on file  . Food insecurity    Worry: Not on file    Inability: Not on file  . Transportation needs    Medical: Not on file    Non-medical: Not on file  Tobacco Use  . Smoking status: Current Every Day Smoker    Packs/day: 0.00    Years: 30.00    Pack years: 0.00    Types: Cigarettes  . Smokeless tobacco: Never Used  . Tobacco comment: 1 to 1.5 ppd   Substance and Sexual Activity  . Alcohol use: Yes    Comment: ETOH most days ," 2- 40 oz beers , and a pint of  liquor  . Drug use: Yes    Types: Marijuana    Comment: 10/25/16  . Sexual activity: Not on file  Lifestyle  . Physical activity    Days per week: Not on file    Minutes per session: Not on file  . Stress: Not on file  Relationships  . Social Herbalist on phone: Not on file    Gets together: Not on file    Attends religious service: Not on file    Active member of club or organization: Not on file    Attends meetings of clubs or organizations: Not on file    Relationship status: Not on file  . Intimate partner violence    Fear of current or ex partner: Not on file    Emotionally abused: Not on file    Physically abused: Not on file    Forced sexual activity: Not on file  Other Topics Concern  . Not on file  Social History Narrative   1 child lives w/ them      Allergies as  of 05/13/2019      Reactions   Pravastatin Itching   Simvastatin Itching      Medication List       Accurate as of May 13, 2019  9:32 AM. If you have any questions, ask your nurse or doctor.        lisinopril 10 MG tablet Commonly known as: ZESTRIL Take 1 tablet (10 mg total) by mouth daily.   warfarin 10 MG tablet Commonly known as: COUMADIN Take as directed by the anticoagulation clinic. If you are unsure how to take this medication, talk to your nurse or doctor. Original instructions: Take 1/2 to 1 tablet daily or as directed by Coumadin clinic           Objective:   Physical Exam BP (!) 142/101 (BP Location: Left Arm, Patient Position: Sitting, Cuff Size: Normal)   Pulse 97   Temp 97.6 F (36.4 C) (Temporal)   Resp 16   Ht 6\' 1"  (1.854 m)   Wt 200 lb 2 oz (90.8 kg)   SpO2 98%   BMI 26.40 kg/m  General:   Well developed, NAD, BMI noted.  HEENT:  Normocephalic . Face symmetric, atraumaticv Abdomen:  Not distended, soft, non-tender. No rebound or rigidity.   Right groin: No hernia that I can tell.  He does have a 21 cm oval soft mass, not attached to deeper  structures. Left groin: + Hernia noted with Valsalva maneuvers GU: Scrotal contents normal Penis normal Skin: Not pale. Not jaundice Neurologic:  alert & oriented X3.  Speech normal, gait appropriate for age and unassisted Psych--  Cognition and judgment appear intact.  Cooperative with normal attention span and concentration.  Behavior appropriate. No anxious or depressed appearing.     Assessment     Assessment HTN Hyperlipidemia- statin intolerant  GERD -EGD 2002 and 2015 (due to nausea) : Gastritis, duodenal inflammation., bx benign, no H. pylori Chest pain:  stres test neg x 2, cath 5/12; minimal LAD irregs; no obs CAD; normal LVF; stress test: 01/16/2014: Low risk study: DVT, leg : on chronic Coumadin (per cardiology) H/o Renal infarct >>> R PAD:  left leg ischemia tx wtih embolectomy of left fem, pop, tib arteries with Dr. Scot Dock - 08/2006;  b. known occlusion of right pop with collats - med Rx;  c.ABI's 8/09: R 1.0; L 0.91  Polysubstance abuse Smoker    PLAN: Left groin hernia: Refer to surgery, incarceration symptoms discussed, ER if severe pain, nausea, vomiting.  Letter provided to his job, avoid lifting more than 10 pounds. Mass, right groin: Consistent with a sebaceous cyst.

## 2019-05-13 NOTE — Progress Notes (Signed)
Pre visit review using our clinic review tool, if applicable. No additional management support is needed unless otherwise documented below in the visit note. 

## 2019-05-14 NOTE — Assessment & Plan Note (Signed)
Left groin hernia: Refer to surgery, incarceration symptoms discussed, ER if severe pain, nausea, vomiting.  Letter provided to his job, avoid lifting more than 10 pounds. Mass, right groin: Consistent with a sebaceous cyst.

## 2019-05-17 ENCOUNTER — Ambulatory Visit (INDEPENDENT_AMBULATORY_CARE_PROVIDER_SITE_OTHER): Payer: BC Managed Care – PPO

## 2019-05-17 ENCOUNTER — Other Ambulatory Visit: Payer: Self-pay

## 2019-05-17 DIAGNOSIS — Z5181 Encounter for therapeutic drug level monitoring: Secondary | ICD-10-CM | POA: Diagnosis not present

## 2019-05-17 DIAGNOSIS — I82629 Acute embolism and thrombosis of deep veins of unspecified upper extremity: Secondary | ICD-10-CM | POA: Diagnosis not present

## 2019-05-17 LAB — POCT INR: INR: 1.9 — AB (ref 2.0–3.0)

## 2019-05-17 NOTE — Patient Instructions (Signed)
Description   Take an extra 1/2 tablet today, then resume same dosage 1 tablet daily except 1/2 tablet on Mondays and Wednesdays. Recheck INR in 4 weeks.  Call with any new medications any procedures or any concerns. Coumadin Clinic [phone].

## 2019-05-28 ENCOUNTER — Ambulatory Visit: Payer: Self-pay | Admitting: General Surgery

## 2019-05-28 DIAGNOSIS — K4090 Unilateral inguinal hernia, without obstruction or gangrene, not specified as recurrent: Secondary | ICD-10-CM | POA: Diagnosis not present

## 2019-05-28 NOTE — H&P (Signed)
History of Present Illness Ralene Ok MD; 05/28/2019 1:50 PM) The patient is a 58 year old male who presents with an inguinal hernia. Referred by: Patient is referred by Dr. Larose Kells Chief Complaint: Left inguinal hernia  Patient is a 58 year old male with a 2 to 3 history of a left inguinal hernia. Patient states that there is been there for this amount time and has gotten worse. He states the does have some pain has since gotten larger. He states with rest this gets better. He states that he's had no signs or symptoms of incarceration or strangulation.  Patient has had no previous abdominal surgery.   Patient has a history of DVTs. He is on Coumadin. This is managed by Dr. Sherren Mocha.   Past Surgical History Lindwood Coke, RN; 05/28/2019 1:26 PM) Vasectomy   Diagnostic Studies History Lindwood Coke, RN; 05/28/2019 1:26 PM) Colonoscopy  1-5 years ago  Allergies Lindwood Coke, RN; 05/28/2019 1:27 PM) Statins Support *DIETARY PRODUCTS/DIETARY MANAGEMENT PRODUCTS*  Allergies Reconciled   Medication History (Diane Herrin, RN; 05/28/2019 1:28 PM) Lisinopril (10MG  Tablet, Oral) Active. Warfarin Sodium (10MG  Tablet, Oral) Active. PriLOSEC OTC (20MG  Tablet DR, Oral) Active. Medications Reconciled  Social History Lindwood Coke, RN; 05/28/2019 1:26 PM) Alcohol use  Moderate alcohol use. Caffeine use  Carbonated beverages. Illicit drug use  Remotely quit drug use. Tobacco use  Current every day smoker.  Family History (Diane Herrin, RN; 05/28/2019 1:26 PM) Heart disease in male family member before age 17  Heart disease in male family member before age 62  Hypertension  Father, Mother.  Other Problems Lindwood Coke, RN; 05/28/2019 1:26 PM) Gastroesophageal Reflux Disease     Review of Systems Ralene Ok MD; 05/28/2019 1:46 PM) General Not Present- Appetite Loss, Chills, Fatigue, Fever, Night Sweats, Weight Gain and Weight Loss. Skin Present-  Rash. Not Present- Change in Wart/Mole, Dryness, Hives, Jaundice, New Lesions, Non-Healing Wounds and Ulcer. Respiratory Not Present- Bloody sputum, Chronic Cough, Difficulty Breathing, Snoring and Wheezing. Breast Not Present- Breast Mass, Breast Pain, Nipple Discharge and Skin Changes. Cardiovascular Not Present- Chest Pain, Difficulty Breathing Lying Down, Leg Cramps, Palpitations, Rapid Heart Rate, Shortness of Breath and Swelling of Extremities. Gastrointestinal Present- Abdominal Pain, Change in Bowel Habits and Excessive gas. Not Present- Bloating, Bloody Stool, Chronic diarrhea, Constipation, Difficulty Swallowing, Gets full quickly at meals, Hemorrhoids, Indigestion, Nausea, Rectal Pain and Vomiting. Musculoskeletal Present- Back Pain. Not Present- Joint Pain, Joint Stiffness, Muscle Pain, Muscle Weakness and Swelling of Extremities. Neurological Not Present- Decreased Memory, Fainting, Headaches, Numbness, Seizures, Tingling, Tremor, Trouble walking and Weakness. Psychiatric Not Present- Anxiety, Bipolar, Change in Sleep Pattern, Depression, Fearful and Frequent crying. Endocrine Not Present- Cold Intolerance, Excessive Hunger, Hair Changes, Heat Intolerance, Hot flashes and New Diabetes. Hematology Present- Blood Thinners. Not Present- Easy Bruising, Excessive bleeding, Gland problems, HIV and Persistent Infections. All other systems negative  Vitals (Diane Herrin RN; 05/28/2019 1:29 PM) 05/28/2019 1:28 PM Weight: 195.13 lb Height: 73in Body Surface Area: 2.13 m Body Mass Index: 25.74 kg/m  Temp.: 98.33F  Pulse: 112 (Regular)  P.OX: 98% (Room air) BP: 144/86(Sitting, Left Arm, Standard)       Physical Exam Ralene Ok MD; 05/28/2019 1:47 PM) The physical exam findings are as follows: Note: Constitutional: No acute distress, conversant, appears stated age  Eyes: Anicteric sclerae, moist conjunctiva, no lid lag  Neck: No thyromegaly, trachea midline, no  cervical lymphadenopathy  Lungs: Clear to auscultation biilaterally, normal respiratory effot  Cardiovascular: regular rate & rhythm, no murmurs, no  peripheal edema, pedal pulses 2+  GI: Soft, no masses or hepatosplenomegaly, non-tender to palpation  MSK: Normal gait, no clubbing cyanosis, edema  Skin: No rashes, palpation reveals normal skin turgor  Psychiatric: Appropriate judgment and insight, oriented to person, place, and time  Abdomen Inspection Hernias - Inguinal hernia - Reducible - Left.    Assessment & Plan Ralene Ok MD; 05/28/2019 1:50 PM)  LEFT INGUINAL HERNIA (K40.90) Impression: 58 year old male with a left inguinal hernia. Patient will require be bridged off his Coumadin. We will run this by Dr. Burt Knack..   1. The patient will like to proceed to the operating room for laparoscopic left inguinal hernia repair with mesh.  2. I discussed with the patient the signs and symptoms of incarceration and strangulation and the need to proceed to the ER should they occur.  3. I discussed with the patient the risks and benefits of the procedure to include but not limited to: Infection, bleeding, damage to surrounding structures, possible need for further surgery, possible nerve pain, and possible recurrence. The patient was understanding and wishes to proceed.

## 2019-05-29 ENCOUNTER — Telehealth: Payer: Self-pay | Admitting: *Deleted

## 2019-05-29 NOTE — Telephone Encounter (Signed)
   [facility] Pre-operative Risk Assessment    Request for surgical clearance:  1. What type of surgery is being performed? LEFT INGUINAL HERNIA w/MESH    2. When is this surgery scheduled? TBD   3. What type of clearance is required (medical clearance vs. Pharmacy clearance to hold med vs. Both)? BOTH  4. Are there any medications that need to be held prior to surgery and how long? WARFARIN; NEED LOVENOX ?   5. Practice name and name of physician performing surgery? [facility]; DR. Anne Hahn RAMIREZ  6. What is your office phone number [phone]    7.   What is your office fax number [phone]  8.   Anesthesia type (None, local, MAC, general) ? GENERAL    Julaine Hua 05/29/2019, 12:06 PM  _________________________________________________________________   (provider comments below)

## 2019-05-29 NOTE — Telephone Encounter (Signed)
Patient with diagnosis of DVT/hypercoaguable state on warfarin for anticoagulation.    Procedure: left inguinal hernia w/ mesh Date of procedure: TBD  CrCl 94.6 Platelet count 251  Per office protocol, patient can hold warfarin for 5 days prior to procedure.    Patient will need bridging with Lovenox (enoxaparin) around procedure.  Pt followed by [facility] - note added to next INR appointment

## 2019-05-29 NOTE — Telephone Encounter (Signed)
   Primary Cardiologist: Sherren Mocha, MD  Chart reviewed as part of pre-operative protocol coverage. Given past medical history and time since last visit, based on ACC/AHA guidelines, TELLER BALGOBIN would be at acceptable risk for the planned procedure without further cardiovascular testing.   Per pharmacy, patient with diagnosis of DVT/hypercoaguable state on warfarin for anticoagulation.    Procedure: left inguinal hernia w/ mesh Date of procedure: TBD  CrCl 94.6 Platelet count 251  Per office protocol, patient can hold warfarin for 5 days prior to procedure.    Patient will need bridging with Lovenox (enoxaparin) around procedure.  Pt followed by [facility] - note added to next INR appointment on 06/14/2019.   I will route this recommendation to the requesting party via Epic fax function and remove from pre-op pool.  Please call with questions.  Kathyrn Drown, NP 05/29/2019, 5:58 PM

## 2019-06-14 ENCOUNTER — Other Ambulatory Visit: Payer: Self-pay

## 2019-06-14 ENCOUNTER — Ambulatory Visit (INDEPENDENT_AMBULATORY_CARE_PROVIDER_SITE_OTHER): Payer: BC Managed Care – PPO

## 2019-06-14 DIAGNOSIS — Z5181 Encounter for therapeutic drug level monitoring: Secondary | ICD-10-CM | POA: Diagnosis not present

## 2019-06-14 DIAGNOSIS — I82629 Acute embolism and thrombosis of deep veins of unspecified upper extremity: Secondary | ICD-10-CM | POA: Diagnosis not present

## 2019-06-14 LAB — POCT INR: INR: 2.1 (ref 2.0–3.0)

## 2019-06-14 NOTE — Patient Instructions (Signed)
Description   Continue on same dosage 1 tablet daily except 1/2 tablet on Mondays and Wednesdays. Recheck INR in 4 weeks prior to hernia surgery on 07/16/19.  Call with any new medications any procedures or any concerns. Coumadin Clinic [phone].

## 2019-07-09 ENCOUNTER — Other Ambulatory Visit: Payer: Self-pay

## 2019-07-09 ENCOUNTER — Ambulatory Visit (INDEPENDENT_AMBULATORY_CARE_PROVIDER_SITE_OTHER): Payer: BC Managed Care – PPO | Admitting: *Deleted

## 2019-07-09 DIAGNOSIS — Z5181 Encounter for therapeutic drug level monitoring: Secondary | ICD-10-CM | POA: Diagnosis not present

## 2019-07-09 LAB — POCT INR: INR: 1.6 — AB (ref 2.0–3.0)

## 2019-07-09 MED ORDER — ENOXAPARIN SODIUM 150 MG/ML ~~LOC~~ SOLN: 150.0000 mg | SUBCUTANEOUS | 0 refills | Status: DC

## 2019-07-09 NOTE — Patient Instructions (Addendum)
12/1: Inject Lovenox 150 mg in the fatty abdominal tissue at least 2 inches from the belly button once a day at 8pm rotate sites. No Coumadin.  12/2:  Inject Lovenox in the fatty tissue at 8pm. No Coumadin.  12/3:  Inject Lovenox in the fatty tissue at 8pm. No Coumadin.  12/4: Inject Lovenox in the fatty tissue at 8pm. No Coumadin.  12/5: Inject Lovenox in the fatty tissue at 8pm. No Coumadin.  12/6: Inject Lovenox in the fatty tissue at 8pm. No Coumadin.  12/7: No Lovenox. No Coumadin.  12/8: Procedure Day - No Lovenox - Resume Coumadin in the evening or as directed by doctor (take an extra half tablet with usual dose for 2 days then resume normal dose).  12/9: Resume Lovenox inject in the fatty tissue at 8 am and take Coumadin.  12/10: Inject Lovenox in the fatty tissue at 8 am and take Coumadin.  12/11: Inject Lovenox in the fatty tissue at 8 am and take Coumadin.  12/12: Inject Lovenox in the fatty tissue at 8 am and take Coumadin.  12/13: Inject Lovenox in the fatty tissue at 8 am and take Coumadin.  12/14: Coumadin appt to check INR.  Description   Follow bridge instructions. Hold coumadin until procedure then resume normal dose 1 tablet daily except for 1/2 a tablet on Monday and Wednesday. Recheck INR  1 week post procedure.  Call with any new medications any procedures or any concerns. Coumadin Clinic [phone].

## 2019-07-10 NOTE — Pre-Procedure Instructions (Signed)
JEYDAN DORMINY  07/10/2019      [id number] - [place], [address] [place] [address] Phone: [phone] Fax: [phone]  [place], [place] - [address] [place] [address] Phone: [phone] Fax: [phone]    Your procedure is scheduled on Dec.8  Report to [facility] Entrance A at 8:45 A.M.  Call this number if you have problems the morning of surgery:  [phone]   Remember:  Do not eat or drink after midnight.      Take these medicines the morning of surgery with A SIP OF WATER :               Esomeprazole (nexium)                             7 days prior to surgery STOP taking any Aspirin (unless otherwise instructed by your surgeon), Aleve, Naproxen, Ibuprofen, Motrin, Advil, Goody's, BC's, all herbal medications, fish oil, and all vitamins.    Do not wear jewelry.  Do not wear lotions, powders, or perfumes, or deodorant.  Do not shave 48 hours prior to surgery.  Men may shave face and neck.  Do not bring valuables to the hospital.  [facility] is not responsible for any belongings or valuables.  Contacts, dentures or bridgework may not be worn into surgery.  Leave your suitcase in the car.  After surgery it may be brought to your room.  For patients admitted to the hospital, discharge time will be determined by your treatment team.  Patients discharged the day of surgery will not be allowed to drive home.    Special instructions:  [place]- Preparing For Surgery  Before surgery, you can play an important role. Because skin is not sterile, your skin needs to be as free of germs as possible. You can reduce the number of germs on your skin by washing with CHG (chlorahexidine gluconate) Soap before surgery.  CHG is an antiseptic cleaner which kills germs and bonds with the skin to continue killing germs  even after washing.    Oral Hygiene is also important to reduce your risk of infection.  Remember - BRUSH YOUR TEETH THE MORNING OF SURGERY WITH YOUR REGULAR TOOTHPASTE  Please do not use if you have an allergy to CHG or antibacterial soaps. If your skin becomes reddened/irritated stop using the CHG.  Do not shave (including legs and underarms) for at least 48 hours prior to first CHG shower. It is OK to shave your face.  Please follow these instructions carefully.   1. Shower the NIGHT BEFORE SURGERY and the MORNING OF SURGERY with CHG.   2. If you chose to wash your hair, wash your hair first as usual with your normal shampoo.  3. After you shampoo, rinse your hair and body thoroughly to remove the shampoo.  4. Use CHG as you would any other liquid soap. You can apply CHG directly to the skin and wash gently with a scrungie or a clean washcloth.   5. Apply the CHG Soap to your body ONLY FROM THE NECK DOWN.  Do not use on open wounds or open sores. Avoid contact with your eyes, ears, mouth and genitals (private parts). Wash Face and genitals (private parts)  with your normal soap.  6. Wash thoroughly, paying special attention to  the area where your surgery will be performed.  7. Thoroughly rinse your body with warm water from the neck down.  8. DO NOT shower/wash with your normal soap after using and rinsing off the CHG Soap.  9. Pat yourself dry with a CLEAN TOWEL.  10. Wear CLEAN PAJAMAS to bed the night before surgery, wear comfortable clothes the morning of surgery  11. Place CLEAN SHEETS on your bed the night of your first shower and DO NOT SLEEP WITH PETS.    Day of Surgery:  Do not apply any deodorants/lotions.  Please wear clean clothes to the hospital/surgery center.   Remember to brush your teeth WITH YOUR REGULAR TOOTHPASTE.    Please read over the following fact sheets that you were given. Coughing and Deep Breathing and Surgical Site Infection  Prevention

## 2019-07-11 ENCOUNTER — Telehealth: Payer: Self-pay | Admitting: *Deleted

## 2019-07-11 ENCOUNTER — Encounter (HOSPITAL_COMMUNITY)
Admission: RE | Admit: 2019-07-11 | Discharge: 2019-07-11 | Disposition: A | Discharge status: Home / Self Care | Payer: BC Managed Care – PPO | Source: Ambulatory Visit | Attending: General Surgery | Admitting: General Surgery

## 2019-07-11 ENCOUNTER — Encounter (HOSPITAL_COMMUNITY): Payer: Self-pay

## 2019-07-11 ENCOUNTER — Other Ambulatory Visit: Payer: Self-pay

## 2019-07-11 DIAGNOSIS — F12929 Cannabis use, unspecified with intoxication, unspecified: Secondary | ICD-10-CM | POA: Insufficient documentation

## 2019-07-11 DIAGNOSIS — Z01812 Encounter for preprocedural laboratory examination: Secondary | ICD-10-CM | POA: Diagnosis not present

## 2019-07-11 DIAGNOSIS — Z7289 Other problems related to lifestyle: Secondary | ICD-10-CM | POA: Diagnosis not present

## 2019-07-11 DIAGNOSIS — K219 Gastro-esophageal reflux disease without esophagitis: Secondary | ICD-10-CM | POA: Diagnosis not present

## 2019-07-11 DIAGNOSIS — Z9049 Acquired absence of other specified parts of digestive tract: Secondary | ICD-10-CM | POA: Insufficient documentation

## 2019-07-11 DIAGNOSIS — Z79899 Other long term (current) drug therapy: Secondary | ICD-10-CM | POA: Diagnosis not present

## 2019-07-11 DIAGNOSIS — I739 Peripheral vascular disease, unspecified: Secondary | ICD-10-CM | POA: Insufficient documentation

## 2019-07-11 DIAGNOSIS — K4090 Unilateral inguinal hernia, without obstruction or gangrene, not specified as recurrent: Secondary | ICD-10-CM | POA: Diagnosis not present

## 2019-07-11 DIAGNOSIS — Z87891 Personal history of nicotine dependence: Secondary | ICD-10-CM | POA: Diagnosis not present

## 2019-07-11 DIAGNOSIS — Z7901 Long term (current) use of anticoagulants: Secondary | ICD-10-CM | POA: Diagnosis not present

## 2019-07-11 DIAGNOSIS — I10 Essential (primary) hypertension: Secondary | ICD-10-CM | POA: Insufficient documentation

## 2019-07-11 DIAGNOSIS — Z86718 Personal history of other venous thrombosis and embolism: Secondary | ICD-10-CM | POA: Diagnosis not present

## 2019-07-11 HISTORY — DX: Patent foramen ovale: Q21.12

## 2019-07-11 HISTORY — DX: Pneumonia, unspecified organism: J18.9

## 2019-07-11 HISTORY — DX: Atrial septal defect: Q21.1

## 2019-07-11 LAB — CBC
HCT: 48.8 % (ref 39.0–52.0)
Hemoglobin: 16.2 g/dL (ref 13.0–17.0)
MCH: 33 pg (ref 26.0–34.0)
MCHC: 33.2 g/dL (ref 30.0–36.0)
MCV: 99.4 fL (ref 80.0–100.0)
Platelets: 229 10*3/uL (ref 150–400)
RBC: 4.91 MIL/uL (ref 4.22–5.81)
RDW: 13.6 % (ref 11.5–15.5)
WBC: 4.6 10*3/uL (ref 4.0–10.5)
nRBC: 0 % (ref 0.0–0.2)

## 2019-07-11 LAB — COMPREHENSIVE METABOLIC PANEL
ALT: 30 U/L (ref 0–44)
AST: 37 U/L (ref 15–41)
Albumin: 4.1 g/dL (ref 3.5–5.0)
Alkaline Phosphatase: 80 U/L (ref 38–126)
Anion gap: 10 (ref 5–15)
BUN: 10 mg/dL (ref 6–20)
CO2: 27 mmol/L (ref 22–32)
Calcium: 9.6 mg/dL (ref 8.9–10.3)
Chloride: 103 mmol/L (ref 98–111)
Creatinine, Ser: 1.13 mg/dL (ref 0.61–1.24)
GFR calc Af Amer: >60 mL/min (ref >60)
GFR calc non Af Amer: >60 mL/min (ref >60)
Glucose, Bld: 93 mg/dL (ref 70–99)
Potassium: 4.5 mmol/L (ref 3.5–5.1)
Sodium: 140 mmol/L (ref 135–145)
Total Bilirubin: 1.2 mg/dL (ref 0.3–1.2)
Total Protein: 6.8 g/dL (ref 6.5–8.1)

## 2019-07-11 NOTE — Telephone Encounter (Addendum)
Pt called and stated he took his Lovenox injection this morning, instead of last night. Discussed pt's case with Edythe Lynn D. Gave pt new instructions and pt is coming by on 07/12/2019 at 9 AM to pick up written new instructions and Lovenox injection 80 mg.  Instructions  12/3:  Inject Lovenox 150 mg in the fatty tissue at 6 am. No Coumadin.  12/4: Inject Lovenox 150 mg in the fatty tissue at 6am. No Coumadin.  12/5: Inject Lovenox 150 mg in the fatty tissue at 6am. No Coumadin.  12/6: Inject Lovenox 150 mg in the fatty tissue at 6am. No Coumadin.  12/7: Inject Lovenox 80 mg in the fatty tissue at 6am. No Coumadin.  12/8: Procedure Day - No Lovenox - Resume Coumadin in the evening or as directed by doctor (take an extra half tablet with usual dose for 2 days then resume normal dose).  12/9: Resume Lovenox inject in the fatty tissue at 8 am and take Coumadin.  12/10: Inject Lovenox in the fatty tissue at 8 am and take Coumadin.  12/11: Inject Lovenox in the fatty tissue at 8 am and take Coumadin.  12/12: Inject Lovenox in the fatty tissue at 8 am and take Coumadin.  12/13: Inject Lovenox in the fatty tissue at 8 am and take Coumadin.  12/14: Coumadin appt to check INR.

## 2019-07-11 NOTE — Progress Notes (Addendum)
PCP - [place] Cardiologist - cooper  PPM/ICD - na Device Orders - na Rep Notified -   Chest x-ray - 08/13/18 EKG - 08/14/18 Stress Test - 01/15/14 ECHO - 07/14/11 Cardiac Cath - na  Sleep Study - na CPAP -   Fasting Blood Sugar - na Checks Blood Sugar _____ times a day  Blood Thinner Instructions:stopped coumadin 12/1, bridge with lovenox, daily injections up til day of surgery Aspirin Instructions:na  ERAS Protcol - PRE-SURGERY Ensure or G2-   COVID TEST- 12/4   Anesthesia review: cardiac clearance(note Karolee Stamps), lovenox bridging Jeneen Rinks, PA notified of pt's blood pressure  Patient denies shortness of breath, fever, cough and chest pain at PAT appointment   All instructions explained to the patient, with a verbal understanding of the material. Patient agrees to go over the instructions while at home for a better understanding. Patient also instructed to self quarantine after being tested for COVID-19. The opportunity to ask questions was provided.

## 2019-07-12 ENCOUNTER — Other Ambulatory Visit (HOSPITAL_COMMUNITY)
Admission: RE | Admit: 2019-07-12 | Discharge: 2019-07-12 | Disposition: A | Discharge status: Home / Self Care | Payer: BC Managed Care – PPO | Source: Ambulatory Visit | Attending: General Surgery | Admitting: General Surgery

## 2019-07-12 ENCOUNTER — Encounter (HOSPITAL_COMMUNITY): Payer: Self-pay

## 2019-07-12 DIAGNOSIS — Z01812 Encounter for preprocedural laboratory examination: Secondary | ICD-10-CM | POA: Insufficient documentation

## 2019-07-12 DIAGNOSIS — Z20828 Contact with and (suspected) exposure to other viral communicable diseases: Secondary | ICD-10-CM | POA: Insufficient documentation

## 2019-07-12 NOTE — Progress Notes (Addendum)
Anesthesia Chart Review:  Case: [id number] Date/Time: 07/16/19 1030   Procedure: LAPAROSCOPIC LEFT INGUINAL HERNIA REPAIR (Left )   Anesthesia type: General   Pre-op diagnosis: LEFT INGUINAL HERNIA   Location: [facility] OR ROOM 02 / [place] [facility]   Surgeon: Ralene Ok, MD      DISCUSSION: Patient is a 58 year old male scheduled for the above procedure.  History includes smoking, HTN, polysubstance abuse (recent marijuana use, daily alcohol, documented history of 1/2 pint liquor and 2 40-oz beers), PAD with acute LLE ischemia 2008 (s/p left femoral, popliteal, tibial embolectomies with retrieval of fresh clot from popliteal artery but chronic occlusion and clot in the left PT/AT arteries 08/24/06), right renal infarct, chest pain history (minimal LAD irregularities 2012), GERD, clotting disorder ("minor abnormalities" on hypercoagulable work-up 2008 by discharge summary, otherwise no specific details), small PFO (per 08/2006 discharge summary).   Preoperative cardiology input outlined by Kathyrn Drown, NP on 05/29/19, "Given past medical history and time since last visit, based on ACC/AHA guidelines, [name] would be at acceptable risk for the planned procedure without further cardiovascular testing Per pharmacy, patient with diagnosis ofDVT/hypercoaguable stateon warfarinfor anticoagulation...  Per office protocol, patient can holdwarfarinfor 5days prior to procedure.  Patientwillneed bridging with Lovenox (enoxaparin) around procedure." (Last dose 07/14/19 PM)  Warfarin on hold for surgery, Lovenox bridge instructions per [facility].   DBP 101 at PAT, does not appear to have been rechecked, but is consistent with previous readings at PCP office. SBP 130-140's. He is on lisinopril. Will route to surgeon.   07/12/19 pre-surgical COVID-19 test is in process.  If results negative, day of surgery PT/INR and BP acceptable, and otherwise no acute changes and I would anticipate that he  could proceed as planned.    VS: BP (!) 146/101   Pulse 97   Temp 37 C (Oral)   Resp 18   Ht 6\' 1"  (1.854 m)   Wt 88.2 kg   SpO2 100%   BMI 25.65 kg/m   BP Readings from Last 3 Encounters:  07/11/19 (!) 146/101  05/13/19 (!) 142/101  02/04/19 (!) 132/92    PROVIDERS: Colon Branch, MD is PCP  Sherren Mocha, MD is cardiologist for PAD. Last visit 06/08/18.    LABS: Labs reviewed: Acceptable for surgery. He is for PT/INR on the day of surgery.  (all labs ordered are listed, but only abnormal results are displayed)  Labs Reviewed  COMPREHENSIVE METABOLIC PANEL  CBC    Mild airflow limitation, normal lung volumes and diffusion capacity on 07/12/11 PFTs.    IMAGES: CT C-spine 02/04/19: IMPRESSION: - C3-4: Spondylosis with foraminal encroachment on the left that could possibly affect the left C4 nerve. - C4-5: Spondylosis with foraminal encroachment on the left that could possibly affect the C5 nerve. - C5-6: Spondylosis with foraminal narrowing right worse than left. The right C6 nerve could be compressed. - C6-7: Spondylosis with foraminal narrowing on both sides that could compress either C7 nerve. This appears worse on the right by CT, but could certainly be significant on both sides.   EKG: 08/13/18: Normal sinus rhythm Minimal voltage criteria for LVH, may be normal variant Borderline ECG When compared with ECG of 01/01/2014, No significant change was found Confirmed by Delora Fuel ([id number]) on 08/13/2018 11:18:36 PM   CV: Nuclear stress tet 01/15/14: Overall Impression:  Low risk stress nuclear study Thinning of the inferior wall not thought to be significant But small area of apical ischemia with  SDS 6. LV Ejection Fraction: 51%.  LV Wall Motion:  Low normal EF No discrete RWMA - No additional testing recommended by Dr. Burt Knack at that time.   Echo 07/14/11 (as outlined in 08/03/11 office note by Dr. Burt Knack): Study Conclusions Left ventricle: The cavity size  was normal. Wall thickness was normal. Systolic function was normal. The estimated ejection fraction was in the range of 55% to 60%. Features are consistent with a pseudonormal left ventricular filling pattern, with concomitant abnormal relaxation and increased filling pressure (grade 2 diastolic dysfunction).   Cardiac cath 12/07/10: FINAL ASSESSMENT: 1. No significant obstructive coronary artery disease. Minor irregularities in LAD. 2. Normal left ventricular systolic function. RECOMMENDATIONS:  We ill reassure the patient regarding his coronary status and recommend continued risk factor modification.   According to 08/2006 discharge summary:  - TEE done 08/2006 as part of work-up for acute LLE ischemia showed:  "EF of 60% with no wall motion abnormalities.  The RV was normal.  In the atrial septum, there was a small PFO with minimal right-to-left shunt with Valsalva.  The aortic valve is trileaflet with trivial AI.  There was trivial MR and TR, and his aorta was normal.  There is no thrombus in his left atrial appendage.  Dr. Haroldine Laws evaluated the study and felt that the small PFO was most likely not the source of emboli.  He recommended complete coverage with heparin until his Coumadin was therapeutic, and he recommended lifelong Coumadin as well." - "Hypercoagulable workup was performed, and there were some minor  abnormalities."       Past Medical History:  Diagnosis Date  . Chest pain    stres test neg x 2, cath 5/12; minimal LAD irregs; no obs CAD; normal LVF  . Clotting disorder ([place])   . GERD (gastroesophageal reflux disease)   . History of DVT of lower extremity    on chronic coumadin  . Hypertension   . Internal hemorrhoids    Cscope 2007  . PAD (peripheral artery disease) ([place])    a. left leg ischemia tx wtih embolectomy of left fem, pop, tib arteries with Dr. Scot Dock - 08/2006;  b. known occlusion of right pop with collats - med Rx;  c.ABI's 8/09: R 1.0; L 0.91   . PFO  (patent foramen ovale)    per 08/2006 discharge summary, TEE showed EF 60%, small PFO with minimal right-to-left shunt with Valsalva; not felt to be the source of emboli, lifelong Coumadin recommended  . Pneumonia    history of  . Polysubstance abuse ([place])   . Renal infarct [facility])    R    Past Surgical History:  Procedure Laterality Date  . CHOLECYSTECTOMY    . COLONOSCOPY    . left leg blood clot removal 2009  2009  . TONSILLECTOMY    . WRIST SURGERY      MEDICATIONS: . enoxaparin (LOVENOX) 150 MG/ML injection  . esomeprazole (NEXIUM) 20 MG capsule  . lisinopril (PRINIVIL,ZESTRIL) 10 MG tablet  . warfarin (COUMADIN) 10 MG tablet   No current facility-administered medications for this encounter.     Myra Gianotti, PA-C Surgical Short Stay/Anesthesiology [facility] Phone [phone] [facility] Phone [phone] 07/12/2019 2:42 PM

## 2019-07-12 NOTE — Anesthesia Preprocedure Evaluation (Addendum)
Anesthesia Evaluation  Patient identified by MRN, date of birth, ID band Patient awake    Reviewed: Allergy & Precautions, H&P , NPO status , Patient's Chart, lab work & pertinent test results  Airway Mallampati: II   Neck ROM: full    Dental   Pulmonary Current Smoker,    breath sounds clear to auscultation       Cardiovascular hypertension, + Peripheral Vascular Disease and + DVT   Rhythm:regular Rate:Normal  Small PFO   Neuro/Psych    GI/Hepatic GERD  ,  Endo/Other    Renal/GU Renal InsufficiencyRenal disease     Musculoskeletal   Abdominal   Peds  Hematology   Anesthesia Other Findings   Reproductive/Obstetrics                            Anesthesia Physical Anesthesia Plan  ASA: III  Anesthesia Plan: General   Post-op Pain Management:    Induction: Intravenous  PONV Risk Score and Plan: 1 and Ondansetron, Dexamethasone, Midazolam and Treatment may vary due to age or medical condition  Airway Management Planned: Oral ETT  Additional Equipment:   Intra-op Plan:   Post-operative Plan: Extubation in OR  Informed Consent: I have reviewed the patients History and Physical, chart, labs and discussed the procedure including the risks, benefits and alternatives for the proposed anesthesia with the patient or authorized representative who has indicated his/her understanding and acceptance.       Plan Discussed with: CRNA, Anesthesiologist and Surgeon  Anesthesia Plan Comments: (PAT note written 07/12/2019 by Myra Gianotti, PA-C. Small PFO 08/2006 by notes. Lovenox bridge per [facility].  )       Anesthesia Quick Evaluation

## 2019-07-13 LAB — NOVEL CORONAVIRUS, NAA (HOSP ORDER, SEND-OUT TO REF LAB; TAT 18-24 HRS): SARS-CoV-2, NAA: NOT DETECTED

## 2019-07-16 ENCOUNTER — Ambulatory Visit (HOSPITAL_COMMUNITY)
Admission: RE | Admit: 2019-07-16 | Discharge: 2019-07-16 | Disposition: A | Discharge status: Home / Self Care | Payer: BC Managed Care – PPO | Attending: General Surgery | Admitting: General Surgery

## 2019-07-16 ENCOUNTER — Other Ambulatory Visit: Payer: Self-pay

## 2019-07-16 ENCOUNTER — Encounter (HOSPITAL_COMMUNITY)
Admission: RE | Disposition: A | Discharge status: Home / Self Care | Payer: Self-pay | Source: Home / Self Care | Attending: General Surgery

## 2019-07-16 ENCOUNTER — Encounter (HOSPITAL_COMMUNITY): Payer: Self-pay | Admitting: *Deleted

## 2019-07-16 ENCOUNTER — Ambulatory Visit (HOSPITAL_COMMUNITY): Payer: BC Managed Care – PPO | Admitting: Anesthesiology

## 2019-07-16 ENCOUNTER — Ambulatory Visit (HOSPITAL_COMMUNITY): Payer: BC Managed Care – PPO | Admitting: Physician Assistant

## 2019-07-16 DIAGNOSIS — Z888 Allergy status to other drugs, medicaments and biological substances status: Secondary | ICD-10-CM | POA: Diagnosis not present

## 2019-07-16 DIAGNOSIS — I10 Essential (primary) hypertension: Secondary | ICD-10-CM | POA: Insufficient documentation

## 2019-07-16 DIAGNOSIS — I739 Peripheral vascular disease, unspecified: Secondary | ICD-10-CM | POA: Insufficient documentation

## 2019-07-16 DIAGNOSIS — K4090 Unilateral inguinal hernia, without obstruction or gangrene, not specified as recurrent: Secondary | ICD-10-CM | POA: Diagnosis not present

## 2019-07-16 DIAGNOSIS — E785 Hyperlipidemia, unspecified: Secondary | ICD-10-CM | POA: Diagnosis not present

## 2019-07-16 DIAGNOSIS — Z86718 Personal history of other venous thrombosis and embolism: Secondary | ICD-10-CM | POA: Diagnosis not present

## 2019-07-16 DIAGNOSIS — Z7901 Long term (current) use of anticoagulants: Secondary | ICD-10-CM | POA: Diagnosis not present

## 2019-07-16 DIAGNOSIS — Z79899 Other long term (current) drug therapy: Secondary | ICD-10-CM | POA: Insufficient documentation

## 2019-07-16 DIAGNOSIS — Z8249 Family history of ischemic heart disease and other diseases of the circulatory system: Secondary | ICD-10-CM | POA: Diagnosis not present

## 2019-07-16 DIAGNOSIS — K219 Gastro-esophageal reflux disease without esophagitis: Secondary | ICD-10-CM | POA: Insufficient documentation

## 2019-07-16 DIAGNOSIS — F17200 Nicotine dependence, unspecified, uncomplicated: Secondary | ICD-10-CM | POA: Insufficient documentation

## 2019-07-16 HISTORY — PX: INGUINAL HERNIA REPAIR: SHX194

## 2019-07-16 LAB — PROTIME-INR
INR: 0.9 (ref 0.8–1.2)
Prothrombin Time: 12.3 seconds (ref 11.4–15.2)

## 2019-07-16 SURGERY — REPAIR, HERNIA, INGUINAL, LAPAROSCOPIC
Anesthesia: General | Site: Abdomen | Laterality: Left

## 2019-07-16 MED ORDER — MIDAZOLAM HCL 2 MG/2ML IJ SOLN
INTRAMUSCULAR | Status: AC
  Filled 2019-07-16: qty 2

## 2019-07-16 MED ORDER — GABAPENTIN 300 MG PO CAPS
ORAL_CAPSULE | ORAL | Status: AC
  Filled 2019-07-16: qty 1

## 2019-07-16 MED ORDER — FENTANYL CITRATE (PF) 100 MCG/2ML IJ SOLN
INTRAMUSCULAR | Status: DC | PRN
  Administered 2019-07-16 (×2): 50 ug via INTRAVENOUS
  Administered 2019-07-16: 100 ug via INTRAVENOUS
  Administered 2019-07-16: 50 ug via INTRAVENOUS

## 2019-07-16 MED ORDER — METOPROLOL TARTRATE 5 MG/5ML IV SOLN
INTRAVENOUS | Status: AC
  Filled 2019-07-16: qty 5

## 2019-07-16 MED ORDER — DEXAMETHASONE SODIUM PHOSPHATE 10 MG/ML IJ SOLN
INTRAMUSCULAR | Status: DC | PRN
  Administered 2019-07-16: 5 mg via INTRAVENOUS

## 2019-07-16 MED ORDER — SUGAMMADEX SODIUM 200 MG/2ML IV SOLN
INTRAVENOUS | Status: DC | PRN
  Administered 2019-07-16: 180 mg via INTRAVENOUS

## 2019-07-16 MED ORDER — OXYCODONE HCL 5 MG PO TABS: 5.0000 mg | ORAL_TABLET | Freq: Once | ORAL | Status: DC | PRN

## 2019-07-16 MED ORDER — CHLORHEXIDINE GLUCONATE CLOTH 2 % EX PADS: 6.0000 | MEDICATED_PAD | Freq: Once | CUTANEOUS | Status: DC

## 2019-07-16 MED ORDER — PROPOFOL 10 MG/ML IV BOLUS
INTRAVENOUS | Status: DC | PRN
  Administered 2019-07-16: 150 mg via INTRAVENOUS
  Administered 2019-07-16: 50 mg via INTRAVENOUS

## 2019-07-16 MED ORDER — LABETALOL HCL 5 MG/ML IV SOLN
10.0000 mg | INTRAVENOUS | Status: AC | PRN
  Administered 2019-07-16 (×5): 10 mg via INTRAVENOUS

## 2019-07-16 MED ORDER — BUPIVACAINE HCL (PF) 0.25 % IJ SOLN
INTRAMUSCULAR | Status: AC
  Filled 2019-07-16: qty 30

## 2019-07-16 MED ORDER — ACETAMINOPHEN 500 MG PO TABS
1000.0000 mg | ORAL_TABLET | ORAL | Status: AC
  Administered 2019-07-16: 1000 mg via ORAL

## 2019-07-16 MED ORDER — LIDOCAINE 2% (20 MG/ML) 5 ML SYRINGE
INTRAMUSCULAR | Status: AC
  Filled 2019-07-16: qty 5

## 2019-07-16 MED ORDER — CELECOXIB 200 MG PO CAPS
ORAL_CAPSULE | ORAL | Status: AC
  Filled 2019-07-16: qty 1

## 2019-07-16 MED ORDER — ONDANSETRON HCL 4 MG/2ML IJ SOLN: 4.0000 mg | Freq: Four times a day (QID) | INTRAMUSCULAR | Status: DC | PRN

## 2019-07-16 MED ORDER — LABETALOL HCL 5 MG/ML IV SOLN
INTRAVENOUS | Status: AC
  Filled 2019-07-16: qty 4

## 2019-07-16 MED ORDER — CEFAZOLIN SODIUM-DEXTROSE 2-4 GM/100ML-% IV SOLN
2.0000 g | INTRAVENOUS | Status: AC
  Administered 2019-07-16: 2 g via INTRAVENOUS
  Filled 2019-07-16: qty 100

## 2019-07-16 MED ORDER — OXYCODONE HCL 5 MG/5ML PO SOLN: 5.0000 mg | Freq: Once | ORAL | Status: DC | PRN

## 2019-07-16 MED ORDER — BUPIVACAINE HCL 0.25 % IJ SOLN
INTRAMUSCULAR | Status: DC | PRN
  Administered 2019-07-16: 6 mL

## 2019-07-16 MED ORDER — FENTANYL CITRATE (PF) 100 MCG/2ML IJ SOLN
INTRAMUSCULAR | Status: AC
  Filled 2019-07-16: qty 2

## 2019-07-16 MED ORDER — LACTATED RINGERS IV SOLN
INTRAVENOUS | Status: DC
  Administered 2019-07-16: 09:00:00 via INTRAVENOUS

## 2019-07-16 MED ORDER — ONDANSETRON HCL 4 MG/2ML IJ SOLN
INTRAMUSCULAR | Status: AC
  Filled 2019-07-16: qty 2

## 2019-07-16 MED ORDER — ROCURONIUM BROMIDE 10 MG/ML (PF) SYRINGE
PREFILLED_SYRINGE | INTRAVENOUS | Status: AC
  Filled 2019-07-16: qty 10

## 2019-07-16 MED ORDER — ONDANSETRON HCL 4 MG/2ML IJ SOLN
INTRAMUSCULAR | Status: DC | PRN
  Administered 2019-07-16: 4 mg via INTRAVENOUS

## 2019-07-16 MED ORDER — MIDAZOLAM HCL 2 MG/2ML IJ SOLN
INTRAMUSCULAR | Status: DC | PRN
  Administered 2019-07-16: 2 mg via INTRAVENOUS

## 2019-07-16 MED ORDER — ROCURONIUM BROMIDE 10 MG/ML (PF) SYRINGE
PREFILLED_SYRINGE | INTRAVENOUS | Status: DC | PRN
  Administered 2019-07-16: 60 mg via INTRAVENOUS

## 2019-07-16 MED ORDER — FENTANYL CITRATE (PF) 100 MCG/2ML IJ SOLN
25.0000 ug | INTRAMUSCULAR | Status: DC | PRN
  Administered 2019-07-16 (×3): 50 ug via INTRAVENOUS

## 2019-07-16 MED ORDER — LIDOCAINE 2% (20 MG/ML) 5 ML SYRINGE
INTRAMUSCULAR | Status: DC | PRN
  Administered 2019-07-16: 50 mg via INTRAVENOUS

## 2019-07-16 MED ORDER — CELECOXIB 200 MG PO CAPS
200.0000 mg | ORAL_CAPSULE | ORAL | Status: AC
  Administered 2019-07-16: 200 mg via ORAL

## 2019-07-16 MED ORDER — FENTANYL CITRATE (PF) 250 MCG/5ML IJ SOLN
INTRAMUSCULAR | Status: AC
  Filled 2019-07-16: qty 5

## 2019-07-16 MED ORDER — OXYCODONE HCL 5 MG PO TABS
ORAL_TABLET | ORAL | Status: AC
  Filled 2019-07-16: qty 1

## 2019-07-16 MED ORDER — ACETAMINOPHEN 500 MG PO TABS
ORAL_TABLET | ORAL | Status: AC
  Filled 2019-07-16: qty 2

## 2019-07-16 MED ORDER — TRAMADOL HCL 50 MG PO TABS: 50.0000 mg | ORAL_TABLET | Freq: Four times a day (QID) | ORAL | 0 refills | Status: DC | PRN

## 2019-07-16 MED ORDER — DEXAMETHASONE SODIUM PHOSPHATE 10 MG/ML IJ SOLN
INTRAMUSCULAR | Status: AC
  Filled 2019-07-16: qty 1

## 2019-07-16 MED ORDER — GABAPENTIN 300 MG PO CAPS
300.0000 mg | ORAL_CAPSULE | ORAL | Status: AC
  Administered 2019-07-16: 300 mg via ORAL

## 2019-07-16 MED ORDER — PROPOFOL 10 MG/ML IV BOLUS
INTRAVENOUS | Status: AC
  Filled 2019-07-16: qty 20

## 2019-07-16 MED ORDER — 0.9 % SODIUM CHLORIDE (POUR BTL) OPTIME
TOPICAL | Status: DC | PRN
  Administered 2019-07-16: 1000 mL

## 2019-07-16 SURGICAL SUPPLY — 42 items
ADH SKN CLS APL DERMABOND .7 (GAUZE/BANDAGES/DRESSINGS) ×1
CANISTER SUCT 3000ML PPV (MISCELLANEOUS) ×2 IMPLANT
COVER SURGICAL LIGHT HANDLE (MISCELLANEOUS) ×3 IMPLANT
COVER WAND RF STERILE (DRAPES) ×3 IMPLANT
DEFOGGER SCOPE WARMER CLEARIFY (MISCELLANEOUS) IMPLANT
DERMABOND ADVANCED (GAUZE/BANDAGES/DRESSINGS) ×2
DERMABOND ADVANCED .7 DNX12 (GAUZE/BANDAGES/DRESSINGS) ×1 IMPLANT
DISSECTOR BLUNT TIP ENDO 5MM (MISCELLANEOUS) IMPLANT
ELECT REM PT RETURN 9FT ADLT (ELECTROSURGICAL) ×3
ELECTRODE REM PT RTRN 9FT ADLT (ELECTROSURGICAL) ×1 IMPLANT
GLOVE BIO SURGEON STRL SZ7.5 (GLOVE) ×3 IMPLANT
GOWN STRL REUS W/ TWL LRG LVL3 (GOWN DISPOSABLE) ×2 IMPLANT
GOWN STRL REUS W/ TWL XL LVL3 (GOWN DISPOSABLE) ×1 IMPLANT
GOWN STRL REUS W/TWL LRG LVL3 (GOWN DISPOSABLE) ×6
GOWN STRL REUS W/TWL XL LVL3 (GOWN DISPOSABLE) ×3
KIT BASIN OR (CUSTOM PROCEDURE TRAY) ×3 IMPLANT
KIT TURNOVER KIT B (KITS) ×3 IMPLANT
MESH 3DMAX 5X7 LT XLRG (Mesh General) ×2 IMPLANT
NDL INSUFFLATION 14GA 120MM (NEEDLE) IMPLANT
NEEDLE INSUFFLATION 14GA 120MM (NEEDLE) ×3 IMPLANT
NS IRRIG 1000ML POUR BTL (IV SOLUTION) ×3 IMPLANT
PAD ARMBOARD 7.5X6 YLW CONV (MISCELLANEOUS) ×6 IMPLANT
RELOAD STAPLE 4.0 BLU F/HERNIA (INSTRUMENTS) ×1 IMPLANT
RELOAD STAPLE 4.8 BLK F/HERNIA (STAPLE) IMPLANT
RELOAD STAPLE HERNIA 4.0 BLUE (INSTRUMENTS) ×3 IMPLANT
RELOAD STAPLE HERNIA 4.8 BLK (STAPLE) IMPLANT
SCISSORS LAP 5X35 DISP (ENDOMECHANICALS) ×3 IMPLANT
SET IRRIG TUBING LAPAROSCOPIC (IRRIGATION / IRRIGATOR) ×2 IMPLANT
SET TUBE SMOKE EVAC HIGH FLOW (TUBING) ×3 IMPLANT
STAPLER HERNIA 12 8.5 360D (INSTRUMENTS) ×3 IMPLANT
SUT MNCRL AB 4-0 PS2 18 (SUTURE) ×3 IMPLANT
SUT VIC AB 1 CT1 27 (SUTURE) ×3
SUT VIC AB 1 CT1 27XBRD ANBCTR (SUTURE) IMPLANT
SYRINGE TOOMEY DISP (SYRINGE) ×3 IMPLANT
TOWEL GREEN STERILE (TOWEL DISPOSABLE) ×3 IMPLANT
TOWEL GREEN STERILE FF (TOWEL DISPOSABLE) ×3 IMPLANT
TRAY FOLEY W/BAG SLVR 14FR (SET/KITS/TRAYS/PACK) ×3 IMPLANT
TRAY LAPAROSCOPIC MC (CUSTOM PROCEDURE TRAY) ×3 IMPLANT
TROCAR OPTICAL SHORT 5MM (TROCAR) ×3 IMPLANT
TROCAR OPTICAL SLV SHORT 5MM (TROCAR) ×3 IMPLANT
TROCAR XCEL 12X100 BLDLESS (ENDOMECHANICALS) ×3 IMPLANT
WATER STERILE IRR 1000ML POUR (IV SOLUTION) ×3 IMPLANT

## 2019-07-16 NOTE — Discharge Instructions (Signed)
[facility] _______[facility] °INGUINAL HERNIA REPAIR: POST OP INSTRUCTIONS ° °Always review your discharge instruction sheet given to you by the facility where your surgery was performed. °IF YOU HAVE DISABILITY OR FAMILY LEAVE FORMS, YOU MUST BRING THEM TO THE OFFICE FOR PROCESSING.   °DO NOT GIVE THEM TO YOUR DOCTOR. ° °1. A  prescription for pain medication may be given to you upon discharge.  Take your pain medication as prescribed, if needed.  If narcotic pain medicine is not needed, then you may take acetaminophen (Tylenol) or ibuprofen (Advil) as needed. °2. Take your usually prescribed medications unless otherwise directed. °If you need a refill on your pain medication, please contact your pharmacy.  They will contact our office to request authorization. Prescriptions will not be filled after 5 pm or on week-ends. °3. You should follow a light diet the first 24 hours after arrival home, such as soup and crackers, etc.  Be sure to include lots of fluids daily.  Resume your normal diet the day after surgery. °4.Most patients will experience some swelling and bruising around the umbilicus or in the groin and scrotum.  Ice packs and reclining will help.  Swelling and bruising can take several days to resolve.  °6. It is common to experience some constipation if taking pain medication after surgery.  Increasing fluid intake and taking a stool softener (such as Colace) will usually help or prevent this problem from occurring.  A mild laxative (Milk of Magnesia or Miralax) should be taken according to package directions if there are no bowel movements after 48 hours. °7. Unless discharge instructions indicate otherwise, you may remove your bandages 24-48 hours after surgery, and you may shower at that time.  You may have steri-strips (small skin tapes) in place directly over the incision.  These strips should be left on the skin for 7-10 days.  If your surgeon used skin glue on the incision, you may  shower in 24 hours.  The glue will flake off over the next 2-3 weeks.  Any sutures or staples will be removed at the office during your follow-up visit. °8. ACTIVITIES:  You may resume regular (light) daily activities beginning the next day--such as daily self-care, walking, climbing stairs--gradually increasing activities as tolerated.  You may have sexual intercourse when it is comfortable.  Refrain from any heavy lifting or straining until approved by your doctor. ° °a.You may drive when you are no longer taking prescription pain medication, you can comfortably wear a seatbelt, and you can safely maneuver your car and apply brakes. °b.RETURN TO WORK:   °_____________________________________________ ° °9.You should see your doctor in the office for a follow-up appointment approximately 2-3 weeks after your surgery.  Make sure that you call for this appointment within a day or two after you arrive home to insure a convenient appointment time. °10.OTHER INSTRUCTIONS: _________________________ °   _____________________________________ ° °WHEN TO CALL YOUR DOCTOR: °1. Fever over 101.0 °2. Inability to urinate °3. Nausea and/or vomiting °4. Extreme swelling or bruising °5. Continued bleeding from incision. °6. Increased pain, redness, or drainage from the incision ° °The clinic staff is available to answer your questions during regular business hours.  Please don’t hesitate to call and ask to speak to one of the nurses for clinical concerns.  If you have a medical emergency, go to the nearest emergency room or call 911.  A surgeon from [facility] is always on call at the hospital ° ° °[address], [address], [place], [place]  [address] ? ° [address] [address], [place], [place]   [address] °[phone] ? [phone] ? FAX [phone] °Web site: [email or web address] ° °

## 2019-07-16 NOTE — H&P (Signed)
History of Present Illness  The patient is a 58 year old male who presents with an inguinal hernia. Referred by: Patient is referred by Dr. Larose Kells Chief Complaint: Left inguinal hernia  Patient is a 58 year old male with a 2 to 3 history of a left inguinal hernia. Patient states that there is been there for this amount time and has gotten worse. He states the does have some pain has since gotten larger. He states with rest this gets better. He states that he's had no signs or symptoms of incarceration or strangulation.  Patient has had no previous abdominal surgery.   Patient has a history of DVTs. He is on Coumadin. This is managed by Dr. Sherren Mocha.   Past Surgical History  Vasectomy   Diagnostic Studies History  Colonoscopy  1-5 years ago  Allergies  Statins Support *DIETARY PRODUCTS/DIETARY MANAGEMENT PRODUCTS*  Allergies Reconciled   Medication History  Lisinopril (10MG  Tablet, Oral) Active. Warfarin Sodium (10MG  Tablet, Oral) Active. PriLOSEC OTC (20MG  Tablet DR, Oral) Active. Medications Reconciled  Social History  Alcohol use  Moderate alcohol use. Caffeine use  Carbonated beverages. Illicit drug use  Remotely quit drug use. Tobacco use  Current every day smoker.  Family History  Heart disease in male family member before age 55  Heart disease in male family member before age 56  Hypertension  Father, Mother.  Other Problems  Gastroesophageal Reflux Disease     Review of Systems  General Not Present- Appetite Loss, Chills, Fatigue, Fever, Night Sweats, Weight Gain and Weight Loss. Skin Present- Rash. Not Present- Change in Wart/Mole, Dryness, Hives, Jaundice, New Lesions, Non-Healing Wounds and Ulcer. Respiratory Not Present- Bloody sputum, Chronic Cough, Difficulty Breathing, Snoring and Wheezing. Breast Not Present- Breast Mass, Breast Pain, Nipple Discharge and Skin Changes. Cardiovascular Not Present- Chest Pain,  Difficulty Breathing Lying Down, Leg Cramps, Palpitations, Rapid Heart Rate, Shortness of Breath and Swelling of Extremities. Gastrointestinal Present- Abdominal Pain, Change in Bowel Habits and Excessive gas. Not Present- Bloating, Bloody Stool, Chronic diarrhea, Constipation, Difficulty Swallowing, Gets full quickly at meals, Hemorrhoids, Indigestion, Nausea, Rectal Pain and Vomiting. Musculoskeletal Present- Back Pain. Not Present- Joint Pain, Joint Stiffness, Muscle Pain, Muscle Weakness and Swelling of Extremities. Neurological Not Present- Decreased Memory, Fainting, Headaches, Numbness, Seizures, Tingling, Tremor, Trouble walking and Weakness. Psychiatric Not Present- Anxiety, Bipolar, Change in Sleep Pattern, Depression, Fearful and Frequent crying. Endocrine Not Present- Cold Intolerance, Excessive Hunger, Hair Changes, Heat Intolerance, Hot flashes and New Diabetes. Hematology Present- Blood Thinners. Not Present- Easy Bruising, Excessive bleeding, Gland problems, HIV and Persistent Infections. All other systems negative  BP (!) 194/107   Pulse 92   Temp 98.3 F (36.8 C) (Oral)   Resp 20   Ht 6\' 1"  (1.854 m)   Wt 88.2 kg   SpO2 100%   BMI 25.65 kg/m       Physical Exam  The physical exam findings are as follows: Note: Constitutional: No acute distress, conversant, appears stated age  Eyes: Anicteric sclerae, moist conjunctiva, no lid lag  Neck: No thyromegaly, trachea midline, no cervical lymphadenopathy  Lungs: Clear to auscultation biilaterally, normal respiratory effot  Cardiovascular: regular rate & rhythm, no murmurs, no peripheal edema, pedal pulses 2+  GI: Soft, no masses or hepatosplenomegaly, non-tender to palpation  MSK: Normal gait, no clubbing cyanosis, edema  Skin: No rashes, palpation reveals normal skin turgor  Psychiatric: Appropriate judgment and insight, oriented to person, place, and time  Abdomen Inspection Hernias - Inguinal  hernia -  Reducible - Left.    Assessment & Plan  LEFT INGUINAL HERNIA (K40.90) Impression: 58 year old male with a left inguinal hernia. Patient will require be bridged off his Coumadin. We will run this by Dr. Burt Knack..   1. The patient will like to proceed to the operating room for laparoscopic left inguinal hernia repair with mesh.  2. I discussed with the patient the signs and symptoms of incarceration and strangulation and the need to proceed to the ER should they occur.  3. I discussed with the patient the risks and benefits of the procedure to include but not limited to: Infection, bleeding, damage to surrounding structures, possible need for further surgery, possible nerve pain, and possible recurrence. The patient was understanding and wishes to proceed.

## 2019-07-16 NOTE — Op Note (Signed)
07/16/2019  11:01 AM  PATIENT:  [name]  58 y.o. male  PRE-OPERATIVE DIAGNOSIS:  LEFT INGUINAL HERNIA  POST-OPERATIVE DIAGNOSIS:  LEFT INDIRECT INGUINAL HERNIA  PROCEDURE:  Procedure(s): LAPAROSCOPIC LEFT INGUINAL HERNIA REPAIR WITH MESH (Left)  SURGEON:  Surgeon(s) and Role:    Ralene Ok, MD - Primary  ANESTHESIA:   local and general  EBL:  3 mL   BLOOD ADMINISTERED:none  DRAINS: none   LOCAL MEDICATIONS USED:  BUPIVICAINE   SPECIMEN:  No Specimen  DISPOSITION OF SPECIMEN:  N/A  COUNTS:  YES  TOURNIQUET:  * No tourniquets in log *  DICTATION: .Dragon Dictation Counts: reported as correct x 2   Findings:  The patient had a moderate sized left indirect hernia and a moderated sized cord lipoma   Indications for procedure:  The patient is a 58 year old male with a left inguinal hernia for several months. Patient complained of symptomatology to the left inguinal area. The patient was taken back for elective inguinal hernia repair.   Details of the procedure: The patient was taken back to the operating room. The patient was placed in supine position with bilateral SCDs in place.  The patient was prepped and draped in the usual sterile fashion.  After appropriate anitbiotics were confirmed, a time-out was confirmed and all facts were verified.   0.25% Marcaine was used to infiltrate the umbilical area. A 11-blade was used to cut down the skin and blunt dissection was used to get the anterior fashion.  The anterior fascia was incised approximately 1 cm and the muscles were retracted laterally. Blunt dissection was then used to create a space in the preperitoneal area. At this time a 10 mm camera was then introduced into the space and advanced the pubic tubercle and a 12 mm trocar was placed over this and insufflation was started.  At this time and space was created from medial to laterally the preperitoneal space.  Cooper's ligament was initially cleaned off.   The hernia sac was identified in the indirect space. Dissection of the hernia sac and cord structures was undertaken the vas deferens was identified and protected in all parts of the case. The hernia sac was very large and associated with a moderate sized cord lipoma.   Once the hernia sac was taken down to approximately the umbilicus a Left Bard 3D Max mesh, size: Rachelle Hora, was  introduced into the preperitoneal space.  The mesh was brought over to cover the direct and indirect hernia spaces.  This was anchored into place and secured to Cooper's ligament with 4.40mm staples from a [place] hernia stapler. It was anchored to the anterior abdominal wall with 4.8 mm staples. The hernia sac was seen lying posterior to the mesh. There was no staples placed laterally. The insufflation was evacuated and the peritoneum was seen posterior to the mesh. The trochars were removed. The anterior fascia was reapproximated using #1 Vicryl on a UR- 6.  Intra-abdominal air was evacuated and the Veress needle removed. The skin was reapproximated using 4-0 Monocryl subcuticular fashion and Dermabond. The patient was awakened from general anesthesia and taken to recovery in stable condition.   PLAN OF CARE: Discharge to home after PACU  PATIENT DISPOSITION:  PACU - hemodynamically stable.   Delay start of Pharmacological VTE agent (>24hrs) due to surgical blood loss or risk of bleeding: not applicable

## 2019-07-16 NOTE — Transfer of Care (Signed)
Immediate Anesthesia Transfer of Care Note  Patient: [name]  Procedure(s) Performed: LAPAROSCOPIC LEFT INGUINAL HERNIA REPAIR WITH MESH (Left Abdomen)  Patient Location: PACU  Anesthesia Type:General  Level of Consciousness: awake, alert  and oriented  Airway & Oxygen Therapy: Patient Spontanous Breathing  Post-op Assessment: Report given to RN  Post vital signs: Reviewed and stable  Last Vitals:  Vitals Value Taken Time  BP 184/117 07/16/19 1112  Temp    Pulse 94 07/16/19 1113  Resp 24 07/16/19 1113  SpO2 100 % 07/16/19 1113  Vitals shown include unvalidated device data.  Last Pain:  Vitals:   07/16/19 0843  TempSrc: Oral  PainSc:          Complications: No apparent anesthesia complications

## 2019-07-16 NOTE — Anesthesia Procedure Notes (Signed)
Procedure Name: Intubation Date/Time: 07/16/2019 10:21 AM Performed by: Barrington Ellison, CRNA Pre-anesthesia Checklist: Patient identified, Emergency Drugs available, Suction available and Patient being monitored Patient Re-evaluated:Patient Re-evaluated prior to induction Oxygen Delivery Method: Circle System Utilized Preoxygenation: Pre-oxygenation with 100% oxygen Induction Type: IV induction Ventilation: Mask ventilation without difficulty Laryngoscope Size: Mac and 4 Grade View: Grade II Tube type: Oral Tube size: 7.5 mm Number of attempts: 1 Airway Equipment and Method: Stylet and Oral airway Placement Confirmation: ETT inserted through vocal cords under direct vision,  positive ETCO2 and breath sounds checked- equal and bilateral Secured at: 22 cm Tube secured with: Tape Dental Injury: Teeth and Oropharynx as per pre-operative assessment

## 2019-07-17 ENCOUNTER — Encounter (HOSPITAL_COMMUNITY): Payer: Self-pay | Admitting: General Surgery

## 2019-07-17 NOTE — Anesthesia Postprocedure Evaluation (Signed)
Anesthesia Post Note  Patient: [name]  Procedure(s) Performed: LAPAROSCOPIC LEFT INGUINAL HERNIA REPAIR WITH MESH (Left Abdomen)     Patient location during evaluation: PACU Anesthesia Type: General Level of consciousness: awake and alert Pain management: pain level controlled Vital Signs Assessment: post-procedure vital signs reviewed and stable Respiratory status: spontaneous breathing, nonlabored ventilation, respiratory function stable and patient connected to nasal cannula oxygen Cardiovascular status: blood pressure returned to baseline and stable Postop Assessment: no apparent nausea or vomiting Anesthetic complications: no    Last Vitals:  Vitals:   07/16/19 1200 07/16/19 1205  BP: (!) 150/107 (!) 141/99  Pulse: 67 69  Resp: 17 (!) 22  Temp:    SpO2: 92% 95%    Last Pain:  Vitals:   07/16/19 1200  TempSrc:   PainSc: [place]

## 2019-07-22 ENCOUNTER — Other Ambulatory Visit: Payer: Self-pay

## 2019-07-22 ENCOUNTER — Ambulatory Visit (INDEPENDENT_AMBULATORY_CARE_PROVIDER_SITE_OTHER): Payer: BC Managed Care – PPO | Admitting: *Deleted

## 2019-07-22 DIAGNOSIS — Z5181 Encounter for therapeutic drug level monitoring: Secondary | ICD-10-CM | POA: Diagnosis not present

## 2019-07-22 LAB — POCT INR: INR: 1.7 — AB (ref 2.0–3.0)

## 2019-07-22 NOTE — Patient Instructions (Addendum)
Description   Continue Lovenox daily (need INR at 2.0 or greater). Today take 1 tablet and take 1.5 tablets tomorrow then continue taking 1 tablet daily except for 1/2 tablet on Monday and Wednesday. Recheck INR on Thursday-on Lovenox.  Call with any new medications any procedures or any concerns. [facility] [phone].

## 2019-07-25 ENCOUNTER — Other Ambulatory Visit: Payer: Self-pay

## 2019-07-25 ENCOUNTER — Ambulatory Visit (INDEPENDENT_AMBULATORY_CARE_PROVIDER_SITE_OTHER): Payer: BC Managed Care – PPO | Admitting: *Deleted

## 2019-07-25 DIAGNOSIS — Z5181 Encounter for therapeutic drug level monitoring: Secondary | ICD-10-CM | POA: Diagnosis not present

## 2019-07-25 LAB — POCT INR: INR: 2 (ref 2.0–3.0)

## 2019-07-25 NOTE — Patient Instructions (Signed)
Description    Stop Lovenox, then Continue taking 1 tablet daily except for 1/2 tablet on Monday and Wednesday. Recheck INR in 2 weeks.  Call with any new medications any procedures or any concerns. [facility] [phone].

## 2019-08-06 ENCOUNTER — Other Ambulatory Visit: Payer: Self-pay | Admitting: Cardiovascular Disease

## 2019-08-06 DIAGNOSIS — E785 Hyperlipidemia, unspecified: Secondary | ICD-10-CM

## 2019-08-06 DIAGNOSIS — I10 Essential (primary) hypertension: Secondary | ICD-10-CM

## 2019-08-08 ENCOUNTER — Other Ambulatory Visit: Payer: Self-pay | Admitting: Cardiovascular Disease

## 2019-08-08 NOTE — Telephone Encounter (Signed)
Attempted to call pt to reschedule missed appt today, LMOM TCB.

## 2019-08-08 NOTE — Telephone Encounter (Signed)
Pt missed appt today in clinic 08/08/19.

## 2019-08-09 ENCOUNTER — Other Ambulatory Visit: Payer: Self-pay | Admitting: Cardiovascular Disease

## 2019-08-09 DIAGNOSIS — I10 Essential (primary) hypertension: Secondary | ICD-10-CM

## 2019-08-09 DIAGNOSIS — E785 Hyperlipidemia, unspecified: Secondary | ICD-10-CM

## 2019-08-13 ENCOUNTER — Ambulatory Visit (INDEPENDENT_AMBULATORY_CARE_PROVIDER_SITE_OTHER): Payer: BC Managed Care – PPO | Admitting: *Deleted

## 2019-08-13 ENCOUNTER — Encounter (INDEPENDENT_AMBULATORY_CARE_PROVIDER_SITE_OTHER): Payer: Self-pay

## 2019-08-13 ENCOUNTER — Other Ambulatory Visit: Payer: Self-pay

## 2019-08-13 DIAGNOSIS — Z5181 Encounter for therapeutic drug level monitoring: Secondary | ICD-10-CM | POA: Diagnosis not present

## 2019-08-13 LAB — POCT INR: INR: 1.6 — AB (ref 2.0–3.0)

## 2019-08-13 NOTE — Patient Instructions (Signed)
Description   Take 1.5 tablets today and 1 tablet tomorrow. Then continue taking 1 tablet daily except for 1/2 tablet on Monday and Wednesday. Recheck INR in 2 week.  Call with any new medications any procedures or any concerns. [facility] [phone].

## 2019-08-26 ENCOUNTER — Other Ambulatory Visit: Payer: Self-pay

## 2019-08-26 ENCOUNTER — Ambulatory Visit (INDEPENDENT_AMBULATORY_CARE_PROVIDER_SITE_OTHER): Payer: BC Managed Care – PPO | Admitting: Pharmacist

## 2019-08-26 DIAGNOSIS — Z5181 Encounter for therapeutic drug level monitoring: Secondary | ICD-10-CM

## 2019-08-26 LAB — POCT INR: INR: 3.9 — AB (ref 2.0–3.0)

## 2019-08-26 NOTE — Patient Instructions (Signed)
Skip dose tomorrow, then continue taking 1 tablet daily except for 1/2 tablet on Monday and Wednesday. Recheck INR in 2 week.  Call with any new medications any procedures or any concerns. [facility] [phone].

## 2019-09-09 ENCOUNTER — Other Ambulatory Visit: Payer: Self-pay

## 2019-09-09 ENCOUNTER — Ambulatory Visit (INDEPENDENT_AMBULATORY_CARE_PROVIDER_SITE_OTHER): Payer: BC Managed Care – PPO | Admitting: *Deleted

## 2019-09-09 DIAGNOSIS — Z5181 Encounter for therapeutic drug level monitoring: Secondary | ICD-10-CM

## 2019-09-09 LAB — POCT INR: INR: 1.4 — AB (ref 2.0–3.0)

## 2019-09-09 NOTE — Patient Instructions (Signed)
Description   Take 1 tablet today and 1.5 tablets tomorrow, then continue to take 1 tablet daily except for 1/2 a tablet on Monday and Wednesday. Recheck INR in 2 week.  Call with any new medications any procedures or any concerns. [facility] [phone].

## 2019-09-23 ENCOUNTER — Other Ambulatory Visit: Payer: Self-pay

## 2019-09-23 ENCOUNTER — Ambulatory Visit (INDEPENDENT_AMBULATORY_CARE_PROVIDER_SITE_OTHER): Payer: BC Managed Care – PPO | Admitting: *Deleted

## 2019-09-23 DIAGNOSIS — Z5181 Encounter for therapeutic drug level monitoring: Secondary | ICD-10-CM | POA: Diagnosis not present

## 2019-09-23 LAB — POCT INR: INR: 4.3 — AB (ref 2.0–3.0)

## 2019-09-23 NOTE — Patient Instructions (Signed)
Description   Do not take any Warfarin tomorrow (already taken today's) then continue to take 1 tablet daily except for 1/2 a tablet on Monday and Wednesday. Please get a serving of green leafy veggies in today and remain consistent. Recheck INR in 2 weeks.  Call with any new medications any procedures or any concerns. [facility] [phone].

## 2019-09-25 ENCOUNTER — Telehealth: Payer: Self-pay | Admitting: Internal Medicine

## 2019-09-25 NOTE — Telephone Encounter (Signed)
Spoke w/ Pt- informed of PCP recommendations. Pt verbalized understanding.  

## 2019-09-25 NOTE — Telephone Encounter (Signed)
Pt still having L inguinal pain after L inguinal hernia repair w/ Dr. Rosendo Gros on 07/16/2019. Please advise.

## 2019-09-25 NOTE — Telephone Encounter (Signed)
Pt states he is still having the buring and stinging sensation that he saw Larose Kells about before. He wanted to know if he needs an appt with Larose Kells or should he just contact his Psychologist, sport and exercise. Please Advise

## 2019-09-25 NOTE — Telephone Encounter (Signed)
Needs to be seen by surgery.  If symptoms severe needs to call them today.

## 2019-10-07 ENCOUNTER — Other Ambulatory Visit: Payer: Self-pay

## 2019-10-07 ENCOUNTER — Encounter (INDEPENDENT_AMBULATORY_CARE_PROVIDER_SITE_OTHER): Payer: Self-pay

## 2019-10-07 ENCOUNTER — Ambulatory Visit (INDEPENDENT_AMBULATORY_CARE_PROVIDER_SITE_OTHER): Payer: BC Managed Care – PPO | Admitting: Pharmacist

## 2019-10-07 DIAGNOSIS — Z5181 Encounter for therapeutic drug level monitoring: Secondary | ICD-10-CM

## 2019-10-07 LAB — PROTIME-INR
INR: 9 (ref 0.9–1.2)
Prothrombin Time: 94.9 s — ABNORMAL HIGH (ref 9.1–12.0)

## 2019-10-07 LAB — POCT INR: INR: 8 — AB (ref 2.0–3.0)

## 2019-10-11 ENCOUNTER — Other Ambulatory Visit: Payer: Self-pay

## 2019-10-11 ENCOUNTER — Other Ambulatory Visit: Payer: Self-pay | Admitting: Cardiovascular Disease

## 2019-10-11 ENCOUNTER — Ambulatory Visit (INDEPENDENT_AMBULATORY_CARE_PROVIDER_SITE_OTHER): Payer: BC Managed Care – PPO | Admitting: *Deleted

## 2019-10-11 DIAGNOSIS — Z5181 Encounter for therapeutic drug level monitoring: Secondary | ICD-10-CM

## 2019-10-11 DIAGNOSIS — I10 Essential (primary) hypertension: Secondary | ICD-10-CM

## 2019-10-11 DIAGNOSIS — E785 Hyperlipidemia, unspecified: Secondary | ICD-10-CM

## 2019-10-11 LAB — POCT INR: INR: 1.3 — AB (ref 2.0–3.0)

## 2019-10-11 NOTE — Patient Instructions (Addendum)
Description   Today take 1.5 tablets and take 1 tablet tomorrow then start taking 1 tablet daily except for 1/2 tablet on Monday, Wednesday, Saturday. Recheck INR in 1 week. Call with any new medications any procedures or any concerns. [facility] [phone].

## 2019-10-25 ENCOUNTER — Other Ambulatory Visit: Payer: Self-pay

## 2019-10-25 ENCOUNTER — Encounter (INDEPENDENT_AMBULATORY_CARE_PROVIDER_SITE_OTHER): Payer: Self-pay

## 2019-10-25 ENCOUNTER — Ambulatory Visit (INDEPENDENT_AMBULATORY_CARE_PROVIDER_SITE_OTHER): Payer: BC Managed Care – PPO

## 2019-10-25 DIAGNOSIS — Z5181 Encounter for therapeutic drug level monitoring: Secondary | ICD-10-CM

## 2019-10-25 LAB — POCT INR: INR: 4.7 — AB (ref 2.0–3.0)

## 2019-10-25 NOTE — Patient Instructions (Addendum)
Description   Skip 2 dosages of Warfarin, then resume your normal dosage of Warfarin 1 tablet daily except for 1/2 tablet on Mondays and Wednesdays. Recheck INR in 10 days. Call with any new medications any procedures or any concerns. [facility] [phone].

## 2019-10-31 ENCOUNTER — Ambulatory Visit: Payer: BC Managed Care – PPO | Admitting: Nurse Practitioner

## 2019-11-04 ENCOUNTER — Other Ambulatory Visit: Payer: Self-pay

## 2019-11-04 ENCOUNTER — Encounter: Payer: Self-pay | Admitting: Gastroenterology

## 2019-11-04 ENCOUNTER — Ambulatory Visit (INDEPENDENT_AMBULATORY_CARE_PROVIDER_SITE_OTHER): Payer: BC Managed Care – PPO | Admitting: *Deleted

## 2019-11-04 ENCOUNTER — Ambulatory Visit (INDEPENDENT_AMBULATORY_CARE_PROVIDER_SITE_OTHER): Payer: BC Managed Care – PPO | Admitting: Gastroenterology

## 2019-11-04 ENCOUNTER — Telehealth: Payer: Self-pay | Admitting: General Surgery

## 2019-11-04 VITALS — BP 102/72 | HR 96 | Temp 98.1°F | Ht 73.0 in | Wt 186.0 lb

## 2019-11-04 DIAGNOSIS — Z7901 Long term (current) use of anticoagulants: Secondary | ICD-10-CM | POA: Diagnosis not present

## 2019-11-04 DIAGNOSIS — Z86010 Personal history of colonic polyps: Secondary | ICD-10-CM | POA: Diagnosis not present

## 2019-11-04 DIAGNOSIS — Z5181 Encounter for therapeutic drug level monitoring: Secondary | ICD-10-CM | POA: Diagnosis not present

## 2019-11-04 DIAGNOSIS — K2100 Gastro-esophageal reflux disease with esophagitis, without bleeding: Secondary | ICD-10-CM | POA: Diagnosis not present

## 2019-11-04 LAB — POCT INR: INR: 2.9 (ref 2.0–3.0)

## 2019-11-04 MED ORDER — NA SULFATE-K SULFATE-MG SULF 17.5-3.13-1.6 GM/177ML PO SOLN: 1.0000 | Freq: Once | ORAL | 0 refills | Status: AC

## 2019-11-04 MED ORDER — WARFARIN SODIUM 10 MG PO TABS: ORAL_TABLET | ORAL | 1 refills | Status: DC

## 2019-11-04 NOTE — Telephone Encounter (Signed)
[facility] Pre-operative Risk Assessment     Request for surgical clearance:     Endoscopy Procedure  What type of surgery is being performed?     Colonoscopy  When is this surgery scheduled?     12/16/2019  What type of clearance is required ?   Pharmacy  Are there any medications that need to be held prior to surgery and how long? Coumadin for 5 days with possible Lovenox bridge  Practice name and name of physician performing surgery?      [facility]  What is your office phone and fax number?      Phone- [phone]  [phone]  Anesthesia type (None, local, MAC, general) ?       MAC

## 2019-11-04 NOTE — Progress Notes (Signed)
11/04/2019 [name]:[id number] [birth year]   HISTORY OF PRESENT ILLNESS: This is a pleasant 59 year old male who is a patient of Dr. Lynne Leader.  He is here to discuss colonoscopy.  He had a colonoscopy in March 2018 at which time he was found to have a few diverticula, internal hemorrhoids, and 2 polyps that were removed that measured between 10 and 20 mm in size.  These were sessile serrated adenomas and submucosal lipomas.  Repeat colonoscopy was recommended at 3-year interval.  He is on Coumadin for history of clotting disorder and DVTs.  This is prescribed by his cardiologist. Dr. Burt Knack.  While he is here he says that he has been having some issues with vomiting depending on what he eats and what time of day that he eats.  His schedule is different as he works 1 AM to 11 AM shift.  He works at [employer] in [employer].  He reports weight loss.  Looking back over the past 3 years, he is down about 16 pounds according to our scale.  He says that since he has been working at [employer] he is doing a lot more walking, etc.  He does have history of acid reflux and does have Nexium 20 mg daily on his medication list.  He admits that he does not take it regularly.  He has had EGDs in the past, the last in March 2018 at which time he had LA grade A esophagitis and a small hiatal hernia.   Past Medical History:  Diagnosis Date  . Chest pain    stres test neg x 2, cath 5/12; minimal LAD irregs; no obs CAD; normal LVF  . Clotting disorder ([place])   . GERD (gastroesophageal reflux disease)   . History of DVT of lower extremity    on chronic coumadin  . Hypertension   . Internal hemorrhoids    Cscope 2007  . PAD (peripheral artery disease) ([place])    a. left leg ischemia tx wtih embolectomy of left fem, pop, tib arteries with Dr. Scot Dock - 08/2006;  b. known occlusion of right pop with collats - med Rx;  c.ABI's 8/09: R 1.0; L 0.91   . PFO (patent foramen ovale)    per 08/2006 discharge summary,  TEE showed EF 60%, small PFO with minimal right-to-left shunt with Valsalva; not felt to be the source of emboli, lifelong Coumadin recommended  . Pneumonia    history of  . Polysubstance abuse ([place])   . Renal infarct [facility])    R   Past Surgical History:  Procedure Laterality Date  . CHOLECYSTECTOMY    . COLONOSCOPY    . INGUINAL HERNIA REPAIR Left 07/16/2019   Procedure: LAPAROSCOPIC LEFT INGUINAL HERNIA REPAIR WITH MESH;  Surgeon: Ralene Ok, MD;  Location: [place];  Service: General;  Laterality: Left;  . left leg blood clot removal 2009  2009  . TONSILLECTOMY    . WRIST SURGERY      reports that he has been smoking cigarettes. He has a 30.00 pack-year smoking history. He has never used smokeless tobacco. He reports current alcohol use. He reports current drug use. Drug: Marijuana. family history includes Breast cancer in his mother; CAD in his father; Diabetes in his father; Heart disease in his paternal grandmother; Hypertension in his father and mother; Lung cancer in his maternal uncle; Pancreatic cancer in his brother; Prostate cancer in his maternal uncle. Allergies  Allergen Reactions  . Pravastatin Itching  . Simvastatin Itching  .  Lisinopril Rash    Tolerating rash currently      Outpatient Encounter Medications as of 11/04/2019  Medication Sig  . esomeprazole (NEXIUM) 20 MG capsule Take 20 mg by mouth daily as needed (acid reflux).  [name] lisinopril (ZESTRIL) 10 MG tablet Take 1 tablet (10 mg total) by mouth daily. Please schedule annual appt with Dr. Burt Knack for refills. [phone]. 1st attempt.  . warfarin (COUMADIN) 10 MG tablet Take as directed by the coumadin clinic  . [DISCONTINUED] traMADol (ULTRAM) 50 MG tablet Take 1 tablet (50 mg total) by mouth every 6 (six) hours as needed.  . enoxaparin (LOVENOX) 150 MG/ML injection Inject 1 mL (150 mg total) into the skin daily. (Patient not taking: Reported on 11/04/2019)   No facility-administered encounter medications on  file as of 11/04/2019.     REVIEW OF SYSTEMS  : All other systems reviewed and negative except where noted in the History of Present Illness.   PHYSICAL EXAM: BP 102/72   Pulse 96   Temp 98.1 F (36.7 C)   Ht 6\' 1"  (1.854 m)   Wt 186 lb (84.4 kg)   BMI 24.54 kg/m  General: Well developed AA male in no acute distress Head: Normocephalic and atraumatic Eyes:  Sclerae anicteric, conjunctiva pink. Ears: Normal auditory acuity Lungs: Clear throughout to auscultation; no increased WOB. Heart: Regular rate and rhythm; no M/R/G. Abdomen: Soft, non-distended.  BS present.  Non-tender. Rectal:  Will be done at the time of colonoscopy. Musculoskeletal: Symmetrical with no gross deformities  Skin: No lesions on visible extremities Extremities: No edema  Neurological: Alert oriented x 4, grossly non-focal Psychological:  Alert and cooperative. Normal mood and affect  ASSESSMENT AND PLAN: *Personal history of colon polyps: Had a couple of large adenomatous polyps removed in March 2018.  3-year recall recommended.  We will schedule for repeat colonoscopy Dr. Fuller Plan. *Chronic anticoagulation with Coumadin due to clotting disorder history of DVTs:  Will hold coumadin for 5 days prior to endoscopic procedures - will instruct when and how to resume after procedure. Benefits and risks of procedure explained including risks of bleeding, perforation, infection, missed lesions, reactions to medications and possible need for hospitalization and surgery for complications. Additional rare but real risk of stroke or other vascular clotting events off of coumadin also explained and need to seek urgent help if any signs of these problems occur. Will communicate by phone or EMR with patient's prescribing provider, Dr. Burt Knack, to confirm that holding coumadin is reasonable in this case.  *GERD: Episodes of vomiting related to what he eats and what time of day he eats.  Had esophagitis on previous EGD in 2018.  Has  Nexium 20 mg on his medication list, but has not been taking it regularly.  I have asked him to begin taking this on a daily basis.  May possibly need to increase dosing as well.   CC:  Colon Branch, MD

## 2019-11-04 NOTE — Progress Notes (Signed)
Reviewed and agree with management plan.  Cassandr Cederberg T. Zilpha Mcandrew, MD FACG [facility]  

## 2019-11-04 NOTE — Patient Instructions (Signed)
Description   Continue normal dosage of Warfarin 1 tablet daily except for 1/2 tablet on Mondays and Wednesdays. Recheck INR in 3 weeks. Call with any new medications any procedures or any concerns. [facility] [phone].

## 2019-11-04 NOTE — Patient Instructions (Signed)
If you are age 57 or older, your body mass index should be between 23-30. Your Body mass index is 24.54 kg/m. If this is out of the aforementioned range listed, please consider follow up with your Primary Care Provider.  If you are age 19 or younger, your body mass index should be between 19-25. Your Body mass index is 24.54 kg/m. If this is out of the aformentioned range listed, please consider follow up with your Primary Care Provider.   You have been scheduled for a colonoscopy. Please follow written instructions given to you at your visit today.  Please pick up your prep supplies at the pharmacy within the next 1-3 days.  Suprep  You will be contaced by our office prior to your procedure for directions on holding your Coumadin/Warfarin.  If you do not hear from our office 1 week prior to your scheduled procedure, please call [phone] to discuss.  Please continue taking your Nexium daily.  Due to recent changes in healthcare laws, you may see the results of your imaging and laboratory studies on MyChart before your provider has had a chance to review them.  We understand that in some cases there may be results that are confusing or concerning to you. Not all laboratory results come back in the same time frame and the provider may be waiting for multiple results in order to interpret others.  Please give [place] 48 hours in order for your provider to thoroughly review all the results before contacting the office for clarification of your results.

## 2019-11-13 NOTE — Telephone Encounter (Signed)
   Primary Cardiologist: Sherren Mocha, MD  Chart reviewed as part of pre-operative protocol coverage.   Request for bridge was sent directly to Dr. Burt Knack, who recommended lovenox bridge while holding coumadin.   Please coordinate with the Family Medicine office who manages his coumadin to setup lovenox bridge.   I will route this recommendation to the requesting party via Epic fax function and remove from pre-op pool.  Please call with questions.  Tami Lin Wilmina Maxham, PA 11/13/2019, 8:32 AM

## 2019-11-13 NOTE — Telephone Encounter (Signed)
Yes. OK to hold warfarin. Looks like we've used lovenox bridging in the past. thanks

## 2019-11-15 ENCOUNTER — Other Ambulatory Visit: Payer: Self-pay

## 2019-11-18 ENCOUNTER — Other Ambulatory Visit: Payer: Self-pay

## 2019-11-18 ENCOUNTER — Encounter: Payer: Self-pay | Admitting: Family Medicine

## 2019-11-18 ENCOUNTER — Ambulatory Visit (INDEPENDENT_AMBULATORY_CARE_PROVIDER_SITE_OTHER): Payer: BC Managed Care – PPO | Admitting: Family Medicine

## 2019-11-18 VITALS — BP 96/67 | HR 84 | Temp 97.7°F | Resp 12 | Ht 73.0 in | Wt 180.6 lb

## 2019-11-18 DIAGNOSIS — M25511 Pain in right shoulder: Secondary | ICD-10-CM | POA: Diagnosis not present

## 2019-11-18 MED ORDER — HYDROCODONE-ACETAMINOPHEN 5-325 MG PO TABS: 1.0000 | ORAL_TABLET | Freq: Four times a day (QID) | ORAL | 0 refills | Status: DC | PRN

## 2019-11-18 MED ORDER — CYCLOBENZAPRINE HCL 10 MG PO TABS: 5.0000 mg | ORAL_TABLET | Freq: Three times a day (TID) | ORAL | 0 refills | Status: DC | PRN

## 2019-11-18 NOTE — Telephone Encounter (Signed)
Yes our office manages patient's warfarin, initial clearance was not sent to PharmD preop pool. Next INR check is scheduled for 4/19, will coordinate Lovenox bridge with pt at that time. Will plan to dose 1.5mg /kg once daily (120mg  Lovenox syringe).

## 2019-11-18 NOTE — Telephone Encounter (Signed)
According to the patients chart his Coumadin is prescribed by Dr Burt Knack and he is seen at the Coumadin clinic on [facility]. Would they not be the health care team to recommend and prescribe a Lovenox bridge while he is off his Coumadin

## 2019-11-18 NOTE — Progress Notes (Signed)
Musculoskeletal Exam  Patient: [name] DOB: 11-18-1960  DOS: 11/18/2019  SUBJECTIVE:  Chief Complaint:   Chief Complaint  Patient presents with  . Shoulder Pain    RIGHT    [name] is a 59 y.o.  male for evaluation and treatment of R shoulder pain.   Onset:  1 month ago. 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  Location: Anterior shoulder Character:  sharp  Progression of issue:  is unchanged Associated symptoms: Decreased ROM; no swelling, bruising, redness Treatment: to date has been [employer].   Neurovascular symptoms: no  Past Medical History:  Diagnosis Date  . Chest pain    stres test neg x 2, cath 5/12; minimal LAD irregs; no obs CAD; normal LVF  . Clotting disorder ([place])   . GERD (gastroesophageal reflux disease)   . History of DVT of lower extremity    on chronic coumadin  . Hypertension   . Internal hemorrhoids    Cscope 2007  . PAD (peripheral artery disease) ([place])    a. left leg ischemia tx wtih embolectomy of left fem, pop, tib arteries with Dr. Scot Dock - 08/2006;  b. known occlusion of right pop with collats - med Rx;  c.ABI's 8/09: R 1.0; L 0.91   . PFO (patent foramen ovale)    per 08/2006 discharge summary, TEE showed EF 60%, small PFO with minimal right-to-left shunt with Valsalva; not felt to be the source of emboli, lifelong Coumadin recommended  . Pneumonia    history of  . Polysubstance abuse ([place])   . Renal infarct ([facility])    R    Objective: VITAL SIGNS: BP 96/67 (BP Location: Right Arm, Cuff Size: Normal)   Pulse 84   Temp 97.7 F (36.5 C) (Temporal)   Resp 12   Ht 6\' 1"  (1.854 m)   Wt 180 lb 9.6 oz (81.9 kg)   SpO2 99%   BMI 23.83 kg/m  Constitutional: Well formed, well developed. No acute distress. Cardiovascular: Brisk cap refill Thorax & Lungs: No accessory muscle use Musculoskeletal: R shoulder.   Normal active range of motion: decreased abduction.   Normal passive range of  motion: yes Tenderness to palpation: yes, over coracoid process Deformity: no Ecchymosis: no Tests positive: Speed's Tests negative: empty can, hawkins, Neer's, lift off, cross over Neurologic: Normal sensory function. No focal deficits noted. Psychiatric: Normal mood. Age appropriate judgment and insight. Alert & oriented x 3.    Assessment:  Acute pain of right shoulder - Plan: HYDROcodone-acetaminophen (NORCO/VICODIN) 5-325 MG tablet, cyclobenzaprine (FLEXERIL) 10 MG tablet, Ambulatory referral to Sports Medicine  Plan: Warnings about narcotic and Flexeril given. Heat. Ice. Tylenol. Stretches/exercises. Letter w 20 lb lifting restriction for work provided. Set up with sports med F/u prn. The patient voiced understanding and agreement to the plan.   [place], DO 11/18/19  2:43 PM

## 2019-11-18 NOTE — Patient Instructions (Signed)
Ice/cold pack over area for 10-15 min twice daily.  Heat (pad or rice pillow in microwave) over affected area, 10-15 minutes twice daily.   OK to take Tylenol 1000 mg (2 extra strength tabs) or 975 mg (3 regular strength tabs) every 6 hours as needed.  Do not drink alcohol, do any illicit/street drugs, drive or do anything that requires alertness while on this medicine.    Biceps Tendon Disruption (Proximal) Rehab Do exercises exactly as told by your health care provider and adjust them as directed. It is normal to feel mild stretching, pulling, tightness, or discomfort as you do these exercises, but you should stop right away if you feel sudden pain or your pain gets worse. Stretching and range of motion exercises These exercises warm up your muscles and joints and improve the movement and flexibility of your arm and shoulder. These exercises also help to relieve pain and stiffness. Exercise A: Shoulder flexion, standing   1. Stand facing a wall. Put your left / right hand on the wall. 2. Slide your left / right hand up the wall. Stop when you feel a stretch in your shoulder, or when you reach the angle recommended by your health care provider.  Use your other hand to help raise your arm, if needed.  As your hand gets higher, you may need to step closer to the wall.  Avoid shrugging your shoulder while you raise your arm. To do this, keep your shoulder blade tucked down toward your spine. 3. Hold for 30 seconds. 4. Slowly return to the starting position. Use your other arm to help, if needed. Repeat 2 times. Complete this exercise 3 times per week. Exercise B: Pendulum   1. Stand near a wall or a surface that you can hold onto for balance. 2. Bend at the waist and let your left / right arm hang straight down. Use your other arm to support you. 3. Relax your arm and shoulder muscles, and move your hips and your trunk so your left / right arm swings freely. Your arm should swing because  of the motion of your body, not because you are using your arm or shoulder muscles. 4. Keep moving so your arm swings in the following directions, as told by your health care provider:  Side to side.  Forward and backward.  In clockwise and counterclockwise circles. 5. Slowly return to the starting position. Repeat 2 times. Complete this exercise 3 times per week.  Strengthening exercises These exercises build strength and endurance in your arm and shoulder. Endurance is the ability to use your muscles for a long time, even after your muscles get tired. Exercise C: Elbow flexion, neutral  1. Sit on a stable chair without armrests, or stand. 2. Hold a 3-5 lb weight in your left / right hand, or hold an exercise band with both hands. Your palms should face each other at the starting position. 3. Bend your left / right elbow and move your hand up toward your shoulder.  Lead with your thumb, and keep your palm facing the same direction.  Keep your other arm straight down, in the starting position. 4. Slowly return to the starting position. Repeat 2-3 times. Complete this exercise 3 times per week. Exercise D: Forearm supination   1. Sit with your left / right forearm on a table. Your elbow should be below shoulder height. Rest your hand over the edge of the table so your palm faces down. 2. If directed, hold a  hammer with your left / right hand. 3. Without moving your elbow, slowly rotate your hand so your palm faces up toward the ceiling.  If you are holding a hammer, begin by holding the hammer near the head. When this exercise gets easier for you, hold the hammer farther down the handle. 4. Hold for 3 seconds. 5. Slowly return to the starting position. Repeat 2 times. Complete this exercise 3 times per week. Exercise E: Scapular retraction   1. Sit in a stable chair without armrests, or stand. 2. Secure an exercise band to a stable object in front of you so the band is at shoulder  height. 3. Hold one end of the exercise band in each hand. 4. Squeeze your shoulder blades together and move your elbows slightly behind you. Do not shrug your shoulders. 5. Hold for 3 seconds. 6. Slowly return to the starting position. Repeat 2 times. Complete this exercise 3 times per week. Exercise F: Scapular protraction, supine   1. Lie on your back on a firm surface. Hold a 3-5 lb weight in your left / right hand. 2. Raise your left / right arm straight into the air so your hand is directly above your shoulder joint. 3. Push the weight into the air so your shoulder lifts off of the surface that you are lying on. Do not move your head, neck, or back. 4. Hold for 3 seconds. 5. Slowly return to the starting position. Let your muscles relax completely before you repeat this exercise. Repeat 2 times. Complete this exercise 3 times per week. This information is not intended to replace advice given to you by your health care provider. Make sure you discuss any questions you have with your health care provider. Document Released: 07/25/2005 Document Revised: 03/31/2016 Document Reviewed: 07/03/2015 Elsevier Interactive Patient Education  2017 [employer].

## 2019-11-25 ENCOUNTER — Ambulatory Visit (INDEPENDENT_AMBULATORY_CARE_PROVIDER_SITE_OTHER): Payer: BC Managed Care – PPO

## 2019-11-25 ENCOUNTER — Encounter: Payer: Self-pay | Admitting: Family Medicine

## 2019-11-25 ENCOUNTER — Ambulatory Visit (INDEPENDENT_AMBULATORY_CARE_PROVIDER_SITE_OTHER): Payer: BC Managed Care – PPO | Admitting: Family Medicine

## 2019-11-25 ENCOUNTER — Ambulatory Visit (INDEPENDENT_AMBULATORY_CARE_PROVIDER_SITE_OTHER): Payer: BC Managed Care – PPO | Admitting: *Deleted

## 2019-11-25 ENCOUNTER — Other Ambulatory Visit: Payer: Self-pay

## 2019-11-25 VITALS — BP 140/90 | HR 90 | Ht 73.0 in | Wt 184.0 lb

## 2019-11-25 DIAGNOSIS — M25511 Pain in right shoulder: Secondary | ICD-10-CM

## 2019-11-25 DIAGNOSIS — M19011 Primary osteoarthritis, right shoulder: Secondary | ICD-10-CM | POA: Diagnosis not present

## 2019-11-25 DIAGNOSIS — Z5181 Encounter for therapeutic drug level monitoring: Secondary | ICD-10-CM | POA: Diagnosis not present

## 2019-11-25 LAB — POCT INR: INR: 4.8 — AB (ref 2.0–3.0)

## 2019-11-25 MED ORDER — ENOXAPARIN SODIUM 120 MG/0.8ML ~~LOC~~ SOLN: 120.0000 mg | SUBCUTANEOUS | 1 refills | Status: DC

## 2019-11-25 NOTE — Patient Instructions (Addendum)
Description   Since you have taken today's dose then do not take any Warfarin tomorrow and No Warfarin on Wednesday then resume taking Warfarin 1 tablet daily except for 1/2 tablet on Mondays and Wednesdays. Recheck INR in 2 weeks prior to colonoscopy. Call with any new medications any procedures or any concerns. Coumadin Clinic [phone].

## 2019-11-25 NOTE — Patient Instructions (Addendum)
Thank you for coming in today. Call or go to the ER if you develop a large red swollen joint with extreme pain or oozing puss.  Plan for PT.  Recheck with me in 4 weeks.  Return or contact me sooner if not doing well.

## 2019-11-25 NOTE — Progress Notes (Addendum)
Subjective:   I, [name], am serving as a scribe for Dr. Lynne Leader.  I'm seeing this patient as a consultation for: Anthony Pal, DO . Note will be routed back to referring provider/PCP.  CC: Acute pain of right shoulder  HPI: Patient is a 59 year old male presenting today with acute right shoulder pain X41month. Patient works at [employer] and has been working Estate manager/land agent. Patient locates pain to anterior shoulder, rates pain as 10/10, and describes pain as sharp. Patients ROM is decreased and no signs of swelling. Patient has been using icy hot. Patient directed by PCP to use heat and ice and Tylenol every 6 hours PRN for pain as needed. patient was given cyclobenzaprine and hydrocodone. Patient has had some Right fingers tinge.   Patient denies any trauma history.  He notes 6 years ago he was doing push-ups and felt a pop in his shoulder and the pain got better after about 6 months.  He did not have pain until about a month ago when he restarted working at the [employer] doing a lot of lifting pushing and pulling.  Past medical history, Surgical history, Family history, Social history, Allergies, and medications have been entered into the medical record, reviewed.   Review of Systems: No new headache, visual changes, nausea, vomiting, diarrhea, constipation, dizziness, abdominal pain, skin rash, fevers, chills, night sweats, weight loss, swollen lymph nodes, body aches, joint swelling, muscle aches, chest pain, shortness of breath, mood changes, visual or auditory hallucinations.   Objective:    Vitals:   11/25/19 1420  BP: 140/90  Pulse: 90  SpO2: 97%   General: Well Developed, well nourished, and in no acute distress.  Neuro/Psych: Alert and oriented x3, extra-ocular muscles intact, able to move all 4 extremities, sensation grossly intact. Skin: Warm and dry, no rashes noted.  Respiratory: Not using accessory muscles, speaking in full sentences,  trachea midline.  Cardiovascular: Pulses palpable, no extremity edema. Abdomen: Does not appear distended. MSK: Right shoulder: Normal-appearing Tender palpation anterior shoulder. Range of motion abduction full however with palpable popping. External rotation full. Internal rotation to lumbar spine. Strength diminished 4/5 abduction.  Normal external and internal rotation strength. Positive Hawkins and Neer's test. Positive empty can test. Positive Yergason's and speeds test.  Left shoulder normal-appearing nontender normal motion normal strength.  Negative impingement or biceps tendinitis testing.  Pulses capillary fill and sensation are intact distally.  Lab and Radiology Results Results for orders placed or performed in visit on 11/25/19 (from the past 72 hour(s))  POCT INR     Status: Abnormal   Collection Time: 11/25/19  9:22 AM  Result Value Ref Range   INR 4.8 (A) 2.0 - 3.0   X-ray images right shoulder obtained today personally and independently reviewed Step-off present at humeral head concerning for old fracture.  Lucency seen on axillary view concerning for nonunion versus fibrous union of fracture through humeral head.  Humeral head is slightly high riding and acromial spur is present. Of note this x-ray was performed after patient left following the visit. Await formal radiology review  ADDENDUM 11/26/19:  Radiology did not see fracture. EXAM: RIGHT SHOULDER - 2+ VIEW  COMPARISON:  None.  FINDINGS: Mild AC joint degenerative change and glenohumeral degenerative change. No fracture or dislocation. High-riding humeral head consistent with rotator cuff disease.  IMPRESSION: 1. No acute osseous abnormality 2. Slightly high-riding humeral head as may be seen with rotator cuff disease 3. AC joint and  glenohumeral mild degenerative change   Electronically Signed   By: Donavan Foil M.D.   On: 11/25/2019 23:30  Diagnostic Limited MSK Ultrasound of: Right  shoulder Biceps tendon is subluxed out of the bicipital groove with increased hypoechoic fluid surrounding the biceps tendon in the bicipital sheath.  No full-thickness biceps tendon tear visible. Subscapularis tendon intact without visible tear. Supraspinatus tendon is intact with increased hypoechoic fluid at subacromial bursa indicating bursitis. Infraspinatus tendon is intact. Possible old fracture as described on x-ray was not seen during my ultrasound examination. Impression: Subacromial bursitis  Procedure: Real-time Ultrasound Guided Injection of right shoulder subacromial bursa Device: Philips Affiniti 50G Images permanently stored and available for review in the ultrasound unit. Verbal informed consent obtained.  Discussed risks and benefits of procedure. Warned about infection bleeding damage to structures skin hypopigmentation and fat atrophy among others. Patient expresses understanding and agreement Time-out conducted.   Noted no overlying erythema, induration, or other signs of local infection.   Skin prepped in a sterile fashion.   Local anesthesia: Topical Ethyl chloride.   With sterile technique and under real time ultrasound guidance:  40 mg of Kenalog and 2 mL of Marcaine injected easily.   Completed without difficulty   Pain partially resolved suggesting accurate placement of the medication.   Advised to call if fevers/chills, erythema, induration, drainage, or persistent bleeding.   Images permanently stored and available for review in the ultrasound unit.  Impression: Technically successful ultrasound guided injection.        Impression and Recommendations:    Assessment and Plan: 59 y.o. male with right shoulder pain ongoing for 1 month without trauma history.  Patient has mechanical symptoms on exam which I thought were due to biceps tendon subluxation and possibly labrum tear.  I also thought some of his pain was coming from subacromial bursitis proceed with  injection.  X-ray obtained after the visit was over shows concern for a possible fibrous union fracture through the humeral head with some displacement.  I am awaiting radiology overread on this.  Originally planned for physical therapy however will await radiology over read may proceed with MRI arthrogram or MRI or CT scan sooner than later with this.  Work restrictions were written as well..  ADDENDUM 11/26/19: Radiology not concerned for fracture.  Plan PT if not better proceed with MRI arthrogram.   PDMP not reviewed this encounter. Orders Placed This Encounter  Procedures  . [place] LIMITED JOINT SPACE STRUCTURES UP RIGHT    Standing Status:   Future    Number of Occurrences:   1    Standing Expiration Date:   01/24/2021    Order Specific Question:   Reason for Exam (SYMPTOM  OR DIAGNOSIS REQUIRED)    Answer:   right shoulder pain    Order Specific Question:   Preferred imaging location?    Answer:   [facility]  . DG Shoulder Right    Standing Status:   Future    Number of Occurrences:   1    Standing Expiration Date:   01/24/2021    Order Specific Question:   Reason for Exam (SYMPTOM  OR DIAGNOSIS REQUIRED)    Answer:   eval right shoulder pain    Order Specific Question:   Preferred imaging location?    Answer:   [facility]    Order Specific Question:   Radiology Contrast Protocol - do NOT remove file path    Answer:   [email or web address]  .  Ambulatory referral to Physical Therapy    Referral Priority:   Routine    Referral Type:   Physical Medicine    Referral Reason:   Specialty Services Required    Requested Specialty:   Physical Therapy   No orders of the defined types were placed in this encounter.   Discussed warning signs or symptoms. Please see discharge instructions. Patient expresses understanding.   The above documentation has been reviewed and is accurate and complete Lynne Leader

## 2019-11-26 NOTE — Progress Notes (Signed)
X-ray shows changes that are consistent with rotator cuff injury.  I was concerned for an old fracture when I saw it myself but radiology does not think there is a fracture.  If not getting better pretty quickly with physical therapy MRI will be helpful.

## 2019-11-29 ENCOUNTER — Telehealth: Payer: Self-pay | Admitting: Family Medicine

## 2019-11-29 NOTE — Telephone Encounter (Signed)
Completed FMLA/work restriction form for [employer].

## 2019-12-01 ENCOUNTER — Other Ambulatory Visit: Payer: Self-pay | Admitting: Cardiovascular Disease

## 2019-12-01 DIAGNOSIS — E785 Hyperlipidemia, unspecified: Secondary | ICD-10-CM

## 2019-12-01 DIAGNOSIS — I10 Essential (primary) hypertension: Secondary | ICD-10-CM

## 2019-12-09 ENCOUNTER — Ambulatory Visit: Payer: BC Managed Care – PPO | Attending: Family Medicine

## 2019-12-09 ENCOUNTER — Other Ambulatory Visit: Payer: Self-pay

## 2019-12-09 ENCOUNTER — Ambulatory Visit (INDEPENDENT_AMBULATORY_CARE_PROVIDER_SITE_OTHER): Payer: BC Managed Care – PPO | Admitting: *Deleted

## 2019-12-09 DIAGNOSIS — G8929 Other chronic pain: Secondary | ICD-10-CM | POA: Diagnosis not present

## 2019-12-09 DIAGNOSIS — M25511 Pain in right shoulder: Secondary | ICD-10-CM | POA: Insufficient documentation

## 2019-12-09 DIAGNOSIS — M6281 Muscle weakness (generalized): Secondary | ICD-10-CM | POA: Insufficient documentation

## 2019-12-09 DIAGNOSIS — Z5181 Encounter for therapeutic drug level monitoring: Secondary | ICD-10-CM

## 2019-12-09 LAB — POCT INR: INR: 2.8 (ref 2.0–3.0)

## 2019-12-09 NOTE — Therapy (Signed)
[place], [place], [address] Phone: [phone]   Fax:  [phone]  Physical Therapy Evaluation  Patient Details  Name: [name] MRN: [id number] Date of Birth: May 01, 1961 Referring Provider (PT): Anthony Artis Corey,MD   Encounter Date: 12/09/2019  PT End of Session - 12/09/19 0758    Visit Number  1    Number of Visits  10    Date for PT Re-Evaluation  01/10/20    Authorization Type  BCBS    PT Start Time  0745    PT Stop Time  0830    PT Time Calculation (min)  45 min    Activity Tolerance  Patient tolerated treatment well    Behavior During Therapy  [facility] for tasks assessed/performed       Past Medical History:  Diagnosis Date  . Chest pain    stres test neg x 2, cath 5/12; minimal LAD irregs; no obs CAD; normal LVF  . Clotting disorder ([place])   . GERD (gastroesophageal reflux disease)   . History of DVT of lower extremity    on chronic coumadin  . Hypertension   . Internal hemorrhoids    Cscope 2007  . PAD (peripheral artery disease) ([place])    a. left leg ischemia tx wtih embolectomy of left fem, pop, tib arteries with Dr. Scot Anthony - 08/2006;  b. known occlusion of right pop with collats - med Rx;  c.ABI's 8/09: R 1.0; L 0.91   . PFO (patent foramen ovale)    per 08/2006 discharge summary, TEE showed EF 60%, small PFO with minimal right-to-left shunt with Valsalva; not felt to be the source of emboli, lifelong Coumadin recommended  . Pneumonia    history of  . Polysubstance abuse ([place])   . Renal infarct [facility])    R    Past Surgical History:  Procedure Laterality Date  . CHOLECYSTECTOMY    . COLONOSCOPY    . INGUINAL HERNIA REPAIR Left 07/16/2019   Procedure: LAPAROSCOPIC LEFT INGUINAL HERNIA REPAIR WITH MESH;  Surgeon: Anthony Ok, MD;  Location: [facility];  Service: General;  Laterality: Left;  . left leg blood clot removal 2009  2009  . TONSILLECTOMY    . WRIST SURGERY      There were  no vitals filed for this visit.   Subjective Assessment - 12/09/19 0751    Subjective  He reports RR shoulder pain. Pop after doing pushups  6 years ago. 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 he has had increased shoulder paiin.  he has been on light duty since 07/2019.   MD said have nerve under a bone.    Pertinent History  injection with benefit.    Diagnostic tests  Xray/US:not sure of results.    Patient Stated Goals  Not sure PT will be of benefit.    Currently in Pain?  No/denies    Pain Score  --   work activity snd with dangling srm st side in past 2 weeks pain levelo 7/10   Pain Location  Shoulder    Pain Orientation  Right;Anterior    Pain Descriptors / Indicators  Aching    Pain Type  Chronic pain    Pain Onset  More than a month ago    Pain Frequency  Intermittent    Aggravating Factors   using RT arm    Pain Relieving Factors  meds         [facility] PT Assessment - 12/09/19 0001  Assessment   Medical Diagnosis  RT shoulder pain    Referring Provider (PT)  Anthony Artis Corey,MD    Onset Date/Surgical Date  --   05/2019   Hand Dominance  Left    Next MD Visit  As needed    Prior Therapy  No      Precautions   Precautions  None      Restrictions   Weight Bearing Restrictions  No      Balance Screen   Has the patient fallen in the past 6 months  No      Prior Function   Level of Independence  Independent      Cognition   Overall Cognitive Status  Within Functional Limits for tasks assessed      Observation/Other Assessments   Focus on Therapeutic Outcomes (FOTO)   50% limited      ROM / Strength   AROM / PROM / Strength  AROM;PROM;Strength      AROM   AROM Assessment Site  Shoulder    Right/Left Shoulder  Right;Left    Right Shoulder Flexion  150 Degrees    Right Shoulder ABduction  150 Degrees    Right Shoulder Internal Rotation  55 Degrees    Right Shoulder External Rotation  90 Degrees    Left Shoulder Flexion  155 Degrees    Left Shoulder  ABduction  155 Degrees    Left Shoulder Internal Rotation  60 Degrees    Left Shoulder External Rotation  90 Degrees      PROM   PROM Assessment Site  Shoulder    Right/Left Shoulder  Right    Right Shoulder Flexion  150 Degrees    Right Shoulder ABduction  150 Degrees    Right Shoulder External Rotation  90 Degrees      Strength   Overall Strength Comments  all LT shoulder 5/5    Strength Assessment Site  Shoulder    Right/Left Shoulder  Right    Right Shoulder Flexion  4/5   pain   Right Shoulder Extension  5/5    Right Shoulder ABduction  4/5   pain   Right Shoulder Internal Rotation  5/5    Right Shoulder External Rotation  4/5   pain               Objective measurements completed on examination: See above findings.              PT Education - 12/09/19 0809    Education Details  POC HEP    Person(s) Educated  Patient    Methods  Explanation;Demonstration;Tactile cues;Verbal cues;Handout    Comprehension  Verbalized understanding;Returned demonstration       PT Short Term Goals - 12/09/19 [id number]      PT SHORT TERM GOAL #1   Title  Independent with intial HEP    Time  2    Period  Weeks    Status  New        PT Long Term Goals - 12/09/19 [id number]      PT LONG TERM GOAL #1   Title  Independent with all HEP    Time  5    Period  Weeks    Status  New      PT LONG TERM GOAL #2   Title  He will impreove RT shouder strength to 4+/5 for normal home and work activity    Time  5    Period  Weeks  Status  New      PT LONG TERM GOAL #3   Title  He will report able to lift more at work (up to 15#) with less pain    Time  5    Period  Weeks    Status  New      PT LONG TERM GOAL #4   Title  FOTO improved to 31% limited    Time  5    Period  Weeks    Status  New             Plan - 12/09/19 0809    Clinical Impression Statement  [name] reportds chronic RT shoulder pain since starting job requirung more lifting. He has pain with  resisted movment and  is weak due to pain.  His ROM is WNL and pain ful overhead.[name]   He should improve with consistent HEP and skilled    Personal Factors and Comorbidities  Time since onset of injury/illness/exacerbation    Examination-Activity Limitations  Lift;Reach Overhead;Carry    Examination-Participation Restrictions  Cleaning;Yard Work    Stability/Clinical Decision Making  Stable/Uncomplicated    Clinical Decision Making  Low    Rehab Potential  Good    PT Frequency  2x / week    PT Duration  --   5 weeks   PT Treatment/Interventions  Cryotherapy;Iontophoresis 4mg /ml Dexamethasone;Ultrasound;Therapeutic exercise;Manual techniques;Passive range of motion;Dry needling;Patient/family education;Taping    PT Next Visit Plan  review HEP modalities and manual, Add tto HEP as able    PT Home Exercise Plan  isometrics ER/flex/abduct  wall slides    Consulted and Agree with Plan of Care  Patient       Patient will benefit from skilled therapeutic intervention in order to improve the following deficits and impairments:  Pain, Increased muscle spasms, Decreased strength  Visit Diagnosis: Chronic right shoulder pain  Muscle weakness (generalized)     Problem List Patient Active Problem List   Diagnosis Date Noted  . History of colonic polyps 11/04/2019  . PCP NOTES >>>>>>>>>>>>>> 07/26/2016  . GERD (gastroesophageal reflux disease) 07/26/2016  . Annual physical exam 07/25/2016  . HTN (hypertension) 09/16/2014  . Nausea without vomiting 11/29/2013  . Encounter for therapeutic drug monitoring 09/05/2013  . Deep vein thrombosis ([place]) 10/06/2010  . Long term current use of anticoagulant 10/06/2010  . Renal infarct ([place]) 10/06/2010  . Dyslipidemia 09/03/2009  . CHEST PAIN-UNSPECIFIED 09/03/2009  . SHOULDER PAIN, RIGHT 03/02/2009  . PERIPHERAL VASCULAR DISEASE 02/11/2009  . SKIN LESION 02/04/2008  . SUBSTANCE ABUSE, MULTIPLE 05/28/2007  . DISORDER, VASCULAR, KIDNEY 05/28/2007  .  DVT, HX OF 12/25/2006    Darrel Hoover  PT 12/09/2019, 8:39 AM  [facility] [address] [place], [place], [address] Phone: [phone]   Fax:  [phone]  Name: [name] MRN: [id number] Date of Birth: April 03, 1961

## 2019-12-09 NOTE — Patient Instructions (Signed)
Isometrics ER/flex/abduction   X 10 reps 5 secs hold daily       Wall slide  X 5  2-3x/day

## 2019-12-09 NOTE — Patient Instructions (Addendum)
Description   FOLLOW DIRECTIONS FOR UPCOMING PROCEDURE. Continue taking Warfarin 1 tablet daily except for 1/2 tablet on Mondays and Wednesdays. Recheck INR in 1 week post colonoscopy. Call with any new medications any procedures or any concerns. [facility] [phone].    12/10/2019: Last dose of Coumadin.  12/11/2019: No Coumadin or Lovenox.  12/12/2019: Inject Lovenox 120mg  in the fatty abdominal tissue at least 2 inches from the belly button daily at 8pm, rotate sites. No Coumadin.  12/13/2019: Inject Lovenox in the fatty tissue at 8pm. No Coumadin.  12/14/2019: Inject Lovenox in the fatty tissue at 8pm. No Coumadin.  12/15/2019: No Lovenox and No Coumadin.  12/16/2019: Procedure Day - No Lovenox - Resume Coumadin in the evening or as directed by doctor (take an extra half tablet with usual dose for 2 days then resume normal dose).  12/17/2019: Resume Lovenox inject in the fatty tissue at 9am and take Coumadin.  12/18/2019: Inject Lovenox in the fatty tissue at 9am and take Coumadin.  12/19/2019: Inject Lovenox in the fatty tissue at 9am and take Coumadin.  12/20/2019: Inject Lovenox in the fatty tissue at 9am and take Coumadin.  12/21/2019: Inject Lovenox in the fatty tissue at 9am and take Coumadin.  12/22/2019: Inject Lovenox in the fatty tissue at 9am and take Coumadin.   12/23/2019:Inject Lovenox in then fatty tissue at 9am and come to Coumadin appt to check INR.

## 2019-12-16 ENCOUNTER — Telehealth: Payer: Self-pay | Admitting: *Deleted

## 2019-12-16 ENCOUNTER — Ambulatory Visit (AMBULATORY_SURGERY_CENTER): Payer: BC Managed Care – PPO | Admitting: Gastroenterology

## 2019-12-16 ENCOUNTER — Other Ambulatory Visit: Payer: Self-pay

## 2019-12-16 ENCOUNTER — Encounter: Payer: Self-pay | Admitting: Gastroenterology

## 2019-12-16 VITALS — BP 144/92 | HR 71 | Temp 96.6°F | Resp 20 | Ht 73.0 in | Wt 186.0 lb

## 2019-12-16 DIAGNOSIS — Z1211 Encounter for screening for malignant neoplasm of colon: Secondary | ICD-10-CM | POA: Diagnosis not present

## 2019-12-16 DIAGNOSIS — Z86010 Personal history of colonic polyps: Secondary | ICD-10-CM | POA: Diagnosis not present

## 2019-12-16 DIAGNOSIS — D123 Benign neoplasm of transverse colon: Secondary | ICD-10-CM | POA: Diagnosis not present

## 2019-12-16 MED ORDER — SODIUM CHLORIDE 0.9 % IV SOLN: 500.0000 mL | INTRAVENOUS | Status: DC

## 2019-12-16 NOTE — Patient Instructions (Signed)
Please read handouts provided. Continue present medications. Await pathology results. Resume Coumadin ( warfarin ) in 2 days at prior dose. No aspirin, ibuprofen, naproxen, or other non-steriodal anti-inflammatory drugs for 2 weeks.      YOU HAD AN ENDOSCOPIC PROCEDURE TODAY AT [facility]:   Refer to the procedure report that was given to you for any specific questions about what was found during the examination.  If the procedure report does not answer your questions, please call your gastroenterologist to clarify.  If you requested that your care partner not be given the details of your procedure findings, then the procedure report has been included in a sealed envelope for you to review at your convenience later.  YOU SHOULD EXPECT: Some feelings of bloating in the abdomen. Passage of more gas than usual.  Walking can help get rid of the air that was put into your GI tract during the procedure and reduce the bloating. If you had a lower endoscopy (such as a colonoscopy or flexible sigmoidoscopy) you may notice spotting of blood in your stool or on the toilet paper. If you underwent a bowel prep for your procedure, you may not have a normal bowel movement for a few days.  Please Note:  You might notice some irritation and congestion in your nose or some drainage.  This is from the oxygen used during your procedure.  There is no need for concern and it should clear up in a day or so.  SYMPTOMS TO REPORT IMMEDIATELY:   Following lower endoscopy (colonoscopy or flexible sigmoidoscopy):  Excessive amounts of blood in the stool  Significant tenderness or worsening of abdominal pains  Swelling of the abdomen that is new, acute  Fever of 100F or higher   For urgent or emergent issues, a gastroenterologist can be reached at any hour by calling [phone]. Do not use MyChart messaging for urgent concerns.    DIET:  We do recommend a small meal at first, but then you may  proceed to your regular diet.  Drink plenty of fluids but you should avoid alcoholic beverages for 24 hours.  ACTIVITY:  You should plan to take it easy for the rest of today and you should NOT DRIVE or use heavy machinery until tomorrow (because of the sedation medicines used during the test).    FOLLOW UP: Our staff will call the number listed on your records 48-72 hours following your procedure to check on you and address any questions or concerns that you may have regarding the information given to you following your procedure. If we do not reach you, we will leave a message.  We will attempt to reach you two times.  During this call, we will ask if you have developed any symptoms of COVID 19. If you develop any symptoms (ie: fever, flu-like symptoms, shortness of breath, cough etc.) before then, please call [phone].  If you test positive for Covid 19 in the 2 weeks post procedure, please call and report this information to [place].    If any biopsies were taken you will be contacted by phone or by letter within the next 1-3 weeks.  Please call us at [phone] if you have not heard about the biopsies in 3 weeks.    SIGNATURES/CONFIDENTIALITY: You and/or your care partner have signed paperwork which will be entered into your electronic medical record.  These signatures attest to the fact that that the information above on your After Visit Summary has been  reviewed and is understood.  Full responsibility of the confidentiality of this discharge information lies with you and/or your care-partner. 

## 2019-12-16 NOTE — Telephone Encounter (Signed)
Spoke with Vena Austria, Melissa. Okay for pt to restart coumadin tonight at normal dose. Restart Lovenox tomorrow morning.   Called and updated pt. Pt verified understanding.

## 2019-12-16 NOTE — Progress Notes (Signed)
Called to room to assist during endoscopic procedure.  Patient ID and intended procedure confirmed with present staff. Received instructions for my participation in the procedure from the performing physician.  

## 2019-12-16 NOTE — Progress Notes (Signed)
A and O x3. Report to RN. Tolerated MAC anesthesia well.

## 2019-12-16 NOTE — Op Note (Signed)
[place] Patient Name: [name] Procedure Date: 12/16/2019 7:56 AM MRN: [id number] Endoscopist: Ladene Artist , MD Age: 59 Referring MD:  Date of Birth: 12/07/1960 Gender: Male Account #: [id number] Procedure:                Colonoscopy Indications:              High risk colon cancer surveillance: Personal                            history of sessile serrated colon polyp (10 mm or                            greater in size) Medicines:                Monitored Anesthesia Care Procedure:                Pre-Anesthesia Assessment:                           - Prior to the procedure, a History and Physical                            was performed, and patient medications and                            allergies were reviewed. The patient's tolerance of                            previous anesthesia was also reviewed. The risks                            and benefits of the procedure and the sedation                            options and risks were discussed with the patient.                            All questions were answered, and informed consent                            was obtained. Prior Anticoagulants: The patient has                            taken Coumadin (warfarin), last dose was 5 days                            prior to procedure. ASA Grade Assessment: II - A                            patient with mild systemic disease. After reviewing                            the risks and benefits, the patient was deemed in  satisfactory condition to undergo the procedure.                           After obtaining informed consent, the colonoscope                            was passed under direct vision. Throughout the                            procedure, the patient's blood pressure, pulse, and                            oxygen saturations were monitored continuously. The                            Colonoscope was introduced  through the anus and                            advanced to the the cecum, identified by                            appendiceal orifice and ileocecal valve. The                            ileocecal valve, appendiceal orifice, and rectum                            were photographed. The quality of the bowel                            preparation was good. The colonoscopy was performed                            without difficulty. The patient tolerated the                            procedure well. Scope In: 8:09:05 AM Scope Out: 8:21:41 AM Scope Withdrawal Time: 0 hours 10 minutes 25 seconds  Total Procedure Duration: 0 hours 12 minutes 36 seconds  Findings:                 The perianal and digital rectal examinations were                            normal.                           A 7 mm polyp was found in the transverse colon. The                            polyp was sessile. The polyp was removed with a                            cold snare. Resection and retrieval were complete.  A few small-mouthed diverticula were found in the                            left colon. There was no evidence of diverticular                            bleeding.                           Internal hemorrhoids were found during                            retroflexion. The hemorrhoids were small and Grade                            I (internal hemorrhoids that do not prolapse).                           The exam was otherwise without abnormality on                            direct and retroflexion views. Complications:            No immediate complications. Estimated blood loss:                            None. Estimated Blood Loss:     Estimated blood loss: none. Impression:               - One 7 mm polyp in the transverse colon, removed                            with a cold snare. Resected and retrieved.                           - Mild diverticulosis in the left colon.                            - Internal hemorrhoids.                           - The examination was otherwise normal on direct                            and retroflexion views. Recommendation:           - Repeat colonoscopy in 3 years for surveillance                            based on pathology results.                           - Resume Coumadin (warfarin) in 2 days at prior                            dose. Refer to managing physician for further  adjustment of therapy.                           - Patient has a contact number available for                            emergencies. The signs and symptoms of potential                            delayed complications were discussed with the                            patient. Return to normal activities tomorrow.                            Written discharge instructions were provided to the                            patient.                           - Resume previous diet.                           - Continue present medications.                           - Await pathology results.                           - No aspirin, ibuprofen, naproxen, or other                            non-steroidal anti-inflammatory drugs for 2 weeks                            after polyp removal. Ladene Artist, MD 12/16/2019 8:24:40 AM This report has been signed electronically.

## 2019-12-16 NOTE — Telephone Encounter (Addendum)
Pt called and stated that he had a colonoscopy today. Pt called inquiring when to restart his warfarin. Pt stated that per his bridge instructions he was to restart his coumadin tonight with an extra 1/2 tablet for 2 dose and his physician instructed him to restart coumadin 2 days after procedure with his normal dose of warfarin. Instructed pt to follow instructions the physician gave him. Pt has appointment to get INR checked on 5/17.    Called pt back and asked him when Dr. Fuller Plan wanted him to restart his Lovenox. Pt stated that he did not have any instructions from Dr. Fuller Plan on when to restart Lovenox injections. Per coumadin clinic bridge instructions pt is to restart Lovenox tomorrow morning to keep him protected.  Informed pt that I would verify with Dr. Fuller Plan.

## 2019-12-16 NOTE — Telephone Encounter (Signed)
Start Lovenox bridge tomorrow morning. Resume Coumadin as per Johny Shock, RN's note.

## 2019-12-17 ENCOUNTER — Encounter: Payer: Self-pay | Admitting: Family Medicine

## 2019-12-17 ENCOUNTER — Telehealth: Payer: Self-pay | Admitting: Family Medicine

## 2019-12-17 NOTE — Telephone Encounter (Signed)
Based on the ppwk we completed for pt employer, they have refused to work with his accommodations and have put him on a LOA until July.  Pt cannot afford this, and would like to be released to work with NO limitations if Dr. Georgina Snell thinks this is OK for him. He plans to continue PT and is just worried about not working vs doing damage by returning to work too soon.

## 2019-12-17 NOTE — Telephone Encounter (Signed)
If pt feels well enough to try to work I can write letter to return to work. Letter will be written

## 2019-12-18 ENCOUNTER — Telehealth: Payer: Self-pay | Admitting: *Deleted

## 2019-12-18 ENCOUNTER — Telehealth: Payer: Self-pay

## 2019-12-18 NOTE — Telephone Encounter (Signed)
LVM

## 2019-12-18 NOTE — Telephone Encounter (Signed)
First attempt, left VM.  

## 2019-12-18 NOTE — Telephone Encounter (Signed)
Called pt and advised him that his letter is ready for pick-up or available through [facility].  Pt states that he will stop by the office to pick up the letter.

## 2019-12-20 ENCOUNTER — Ambulatory Visit: Payer: BC Managed Care – PPO | Admitting: Physician Assistant

## 2019-12-20 ENCOUNTER — Encounter: Payer: Self-pay | Admitting: Cardiovascular Disease

## 2019-12-20 ENCOUNTER — Other Ambulatory Visit: Payer: Self-pay

## 2019-12-20 ENCOUNTER — Ambulatory Visit (INDEPENDENT_AMBULATORY_CARE_PROVIDER_SITE_OTHER): Payer: BC Managed Care – PPO | Admitting: Cardiovascular Disease

## 2019-12-20 VITALS — BP 108/72 | HR 106 | Ht 73.0 in | Wt 177.0 lb

## 2019-12-20 DIAGNOSIS — R0602 Shortness of breath: Secondary | ICD-10-CM | POA: Diagnosis not present

## 2019-12-20 DIAGNOSIS — I10 Essential (primary) hypertension: Secondary | ICD-10-CM | POA: Diagnosis not present

## 2019-12-20 DIAGNOSIS — I739 Peripheral vascular disease, unspecified: Secondary | ICD-10-CM | POA: Diagnosis not present

## 2019-12-20 MED ORDER — LISINOPRIL 10 MG PO TABS: 10.0000 mg | ORAL_TABLET | Freq: Every day | ORAL | 3 refills | Status: DC

## 2019-12-20 NOTE — Progress Notes (Signed)
Cardiology Office Note:    Date:  12/20/2019   ID:  [name], DOB 29-Jan-1961, MRN [id number]  PCP:  Colon Branch, MD  Cardiologist:  Sherren Mocha, MD  Electrophysiologist:  None   Referring MD: Colon Branch, MD   Chief Complaint  Patient presents with  . Chest Pain    History of Present Illness:    [name] is a 59 y.o. male with a hx of peripheral arterial disease, maintained on chronic warfarin.  The patient has a history of hypercoagulable disorder with left femoral and popliteal artery occlusion in 2008 requiring embolectomy.  He has a history of renal infarction as well.  Cardiac catheterization in 2012 for evaluation of chronic chest pain demonstrated widely patent coronary arteries with no angiographic evidence of disease.  The patient has a history of polysubstance use.  He continues to smoke cigarettes and drink excessive alcohol.  Reports occasional marijuana use.  Does not use any other drugs.  He is compliant with his medications.  He has occasional chest pains but these are fleeting and feel like muscular pain.  He reports occasional shortness of breath with activity, but overall feels like he is doing pretty well with respect to cardiac symptoms.  He has no orthopnea, PND, leg swelling, or heart palpitations.  He reports no bleeding problems on chronic warfarin.  Past Medical History:  Diagnosis Date  . Chest pain    stres test neg x 2, cath 5/12; minimal LAD irregs; no obs CAD; normal LVF  . Clotting disorder (HCC)    dvt  . GERD (gastroesophageal reflux disease)   . History of DVT of lower extremity    on chronic coumadin  . Hypertension   . Internal hemorrhoids    Cscope 2007  . PAD (peripheral artery disease) ([place])    a. left leg ischemia tx wtih embolectomy of left fem, pop, tib arteries with Dr. Scot Dock - 08/2006;  b. known occlusion of right pop with collats - med Rx;  c.ABI's 8/09: R 1.0; L 0.91   . PFO (patent foramen ovale)    per 08/2006  discharge summary, TEE showed EF 60%, small PFO with minimal right-to-left shunt with Valsalva; not felt to be the source of emboli, lifelong Coumadin recommended  . Pneumonia    history of  . Polysubstance abuse ([place])    marijuana only  . Renal infarct [facility])    R    Past Surgical History:  Procedure Laterality Date  . CHOLECYSTECTOMY    . COLONOSCOPY    . INGUINAL HERNIA REPAIR Left 07/16/2019   Procedure: LAPAROSCOPIC LEFT INGUINAL HERNIA REPAIR WITH MESH;  Surgeon: Ralene Ok, MD;  Location: [facility];  Service: General;  Laterality: Left;  . left leg blood clot removal 2009  2009  . TONSILLECTOMY    . WRIST SURGERY      Current Medications: Current Meds  Medication Sig  . enoxaparin (LOVENOX) 120 MG/0.8ML injection Inject 0.8 mLs (120 mg total) into the skin daily.  [name] esomeprazole (NEXIUM) 20 MG capsule Take 20 mg by mouth daily as needed (acid reflux).  [name] HYDROcodone-acetaminophen (NORCO/VICODIN) 5-325 MG tablet Take 1 tablet by mouth every 6 (six) hours as needed for moderate pain.  [name] lisinopril (ZESTRIL) 10 MG tablet Take 1 tablet (10 mg total) by mouth daily.  [name] warfarin (COUMADIN) 10 MG tablet TAKE 1/2 TO 1 (ONE-HALF TO ONE) TABLET BY MOUTH ONCE DAILY AS  DIRECTED  BY  COUMADIN  CLINIC  . [  DISCONTINUED] lisinopril (ZESTRIL) 10 MG tablet Take 1 tablet (10 mg total) by mouth daily. Please make overdue appt with Dr. Burt Knack before anymore refills. 2nd attempt     Allergies:   Pravastatin, Simvastatin, and Lisinopril   Social History   Socioeconomic History  . Marital status: Married    Spouse name: Not on file  . Number of children: 2  . Years of education: Not on file  . Highest education level: Not on file  Occupational History  . Occupation: [employer], driver   Tobacco Use  . Smoking status: Current Every Day Smoker    Packs/day: 1.00    Years: 30.00    Pack years: 30.00    Types: Cigarettes  . Smokeless tobacco: Never Used  . Tobacco comment: 1 to 1.5 ppd     Substance and Sexual Activity  . Alcohol use: Yes    Comment: ETOH most days ," 2- 40 oz beers , and a pint of liquor  . Drug use: Yes    Types: Marijuana    Comment: 07/09/19  . Sexual activity: Yes    Partners: Female  Other Topics Concern  . Not on file  Social History Narrative   1 child lives w/ them   Social Determinants of Health   Financial Resource Strain:   . Difficulty of Paying Living Expenses:   Food Insecurity:   . Worried About Charity fundraiser in the Last Year:   . Arboriculturist in the Last Year:   Transportation Needs:   . Film/video editor (Medical):   [name] Lack of Transportation (Non-Medical):   Physical Activity:   . Days of Exercise per Week:   . Minutes of Exercise per Session:   Stress:   . Feeling of Stress :   Social Connections:   . Frequency of Communication with Friends and Family:   . Frequency of Social Gatherings with Friends and Family:   . Attends Religious Services:   . Active Member of Clubs or Organizations:   . Attends Archivist Meetings:   [name] Marital Status:      Family History: The patient's family history includes Breast cancer in his mother; CAD in his father; Diabetes in his father; Heart disease in his paternal grandmother; Hypertension in his father and mother; Lung cancer in his maternal uncle; Pancreatic cancer in his brother; Prostate cancer in his maternal uncle. There is no history of Colon cancer, Stomach cancer, Esophageal cancer, or Rectal cancer.  ROS:   Please see the history of present illness.    Positive for rash on the back, otherwise all other systems reviewed and are negative.  EKGs/Labs/Other Studies Reviewed:    EKG:  EKG is ordered today.  The ekg ordered today demonstrates sinus tachycardia 106 bpm, otherwise within normal limits.  Recent Labs: 07/11/2019: ALT 30; BUN 10; Creatinine, Ser 1.13; Hemoglobin 16.2; Platelets 229; Potassium 4.5; Sodium 140  Recent Lipid Panel    Component  Value Date/Time   CHOL 241 (H) 07/25/2016 1539   TRIG 179.0 (H) 07/25/2016 1539   HDL 51.20 07/25/2016 1539   CHOLHDL 5 07/25/2016 1539   VLDL 35.8 07/25/2016 1539   LDLCALC 154 (H) 07/25/2016 1539   LDLDIRECT 143.7 11/24/2008 1235    Physical Exam:    VS:  BP 108/72   Pulse (!) 106   Ht 6\' 1"  (1.854 m)   Wt 177 lb (80.3 kg)   SpO2 98%   BMI 23.35 kg/m  Wt Readings from Last 3 Encounters:  12/20/19 177 lb (80.3 kg)  12/16/19 186 lb (84.4 kg)  11/25/19 184 lb (83.5 kg)     GEN:  Well nourished, well developed in no acute distress HEENT: Normal NECK: No JVD; No carotid bruits LYMPHATICS: No lymphadenopathy CARDIAC: Tachycardic and regular, no murmurs, rubs, gallops RESPIRATORY:  Clear to auscultation without rales, wheezing or rhonchi  ABDOMEN: Soft, non-tender, non-distended MUSCULOSKELETAL:  No edema; No deformity  SKIN: Warm and dry NEUROLOGIC:  Alert and oriented x 3 PSYCHIATRIC:  Normal affect   ASSESSMENT:    1. Shortness of breath   2. Essential hypertension   3. PAD (peripheral artery disease) (HCC)    PLAN:    In order of problems listed above:  1. Exam is unremarkable with clear lung fields.  Cardiac exam is also normal.  No evidence of congestive heart failure.  I have recommended checking an echocardiogram to evaluate for LV dysfunction.  He understands a heavy alcohol use is associated with cardiomyopathy and I think we should screen him for this in light of his shortness of breath. 2. Blood pressure is controlled on lisinopril.  Will continue without change.  I examined his skin rash on the back and it is isolated to the upper back, not typical for a drug rash. 3. Continues to be managed through the Coumadin clinic.  Tobacco cessation is discussed.  Alcohol cessation is discussed.  We reviewed long-term risks associated with both of these today.   Medication Adjustments/Labs and Tests Ordered: Current medicines are reviewed at length with the  patient today.  Concerns regarding medicines are outlined above.  Orders Placed This Encounter  Procedures  . EKG 12-Lead  . ECHOCARDIOGRAM COMPLETE   Meds ordered this encounter  Medications  . lisinopril (ZESTRIL) 10 MG tablet    Sig: Take 1 tablet (10 mg total) by mouth daily.    Dispense:  90 tablet    Refill:  3    Patient Instructions  Medication Instructions:  Your provider recommends that you continue on your current medications as directed. Please refer to the Current Medication list given to you today.   *If you need a refill on your cardiac medications before your next appointment, please call your pharmacy*  Testing/Procedures: Your provider has requested that you have an echocardiogram. Echocardiography is a painless test that uses sound waves to create images of your heart. It provides your doctor with information about the size and shape of your heart and how well your heart's chambers and valves are working. This procedure takes approximately one hour. There are no restrictions for this procedure.  Follow-Up: At [facility], you and your health needs are our priority.  As part of our continuing mission to provide you with exceptional heart care, we have created designated Provider Care Teams.  These Care Teams include your primary Cardiologist (physician) and Advanced Practice Providers (APPs -  Physician Assistants and Nurse Practitioners) who all work together to provide you with the care you need, when you need it. Your next appointment:   12 month(s) The format for your next appointment:   In Person Provider:   You may see Sherren Mocha, MD or one of the following Advanced Practice Providers on your designated Care Team:    Richardson Dopp, PA-C  Robbie Lis, [place]      Signed, Sherren Mocha, MD  12/20/2019 5:36 PM    [place]

## 2019-12-20 NOTE — Patient Instructions (Signed)
Medication Instructions:  Your provider recommends that you continue on your current medications as directed. Please refer to the Current Medication list given to you today.   *If you need a refill on your cardiac medications before your next appointment, please call your pharmacy*  Testing/Procedures: Your provider has requested that you have an echocardiogram. Echocardiography is a painless test that uses sound waves to create images of your heart. It provides your doctor with information about the size and shape of your heart and how well your heart's chambers and valves are working. This procedure takes approximately one hour. There are no restrictions for this procedure.      Follow-Up: At [facility], you and your health needs are our priority.  As part of our continuing mission to provide you with exceptional heart care, we have created designated Provider Care Teams.  These Care Teams include your primary Cardiologist (physician) and Advanced Practice Providers (APPs -  Physician Assistants and Nurse Practitioners) who all work together to provide you with the care you need, when you need it. Your next appointment:   12 month(s) The format for your next appointment:   In Person Provider:   You may see Anthony Cooper, MD or one of the following Advanced Practice Providers on your designated Care Team:    Anthony Weaver, PA-C  Anthony Bhagat, PA-C   

## 2019-12-23 ENCOUNTER — Telehealth: Payer: Self-pay | Admitting: *Deleted

## 2019-12-23 ENCOUNTER — Ambulatory Visit: Payer: BC Managed Care – PPO

## 2019-12-23 NOTE — Telephone Encounter (Addendum)
Pt called this morning stating that he did not have anymore Lovenox. Informed pt that when he comes to appointment today that we can check INR to see if he needs to continue Lovenox injections. Pt missed appointment. Called pt and wife picked up informed her that pt will need to call coumadin clinic back.

## 2019-12-23 NOTE — Telephone Encounter (Signed)
Pt stated that he has not missed any doses of warfarin. Spoke with pharm D who stated that since we are unsure what pt's INR is then do not send in any more refills of lovenox. Called and updated pt and scheduled an appointment for tomorrow for pt to get INR checked.

## 2019-12-24 ENCOUNTER — Other Ambulatory Visit: Payer: Self-pay

## 2019-12-24 ENCOUNTER — Encounter: Payer: Self-pay | Admitting: Gastroenterology

## 2019-12-24 ENCOUNTER — Ambulatory Visit (INDEPENDENT_AMBULATORY_CARE_PROVIDER_SITE_OTHER): Payer: BC Managed Care – PPO | Admitting: *Deleted

## 2019-12-24 DIAGNOSIS — Z5181 Encounter for therapeutic drug level monitoring: Secondary | ICD-10-CM | POA: Diagnosis not present

## 2019-12-24 LAB — POCT INR: INR: 2.7 (ref 2.0–3.0)

## 2019-12-24 NOTE — Patient Instructions (Signed)
Description   Stop Lovenox injections. Continue taking Warfarin 1 tablet daily except for 1/2 tablet on Mondays and Wednesdays. Recheck INR in 3 weeks. Call with any new medications any procedures or any concerns. Coumadin Clinic [phone].

## 2019-12-26 ENCOUNTER — Other Ambulatory Visit: Payer: Self-pay

## 2019-12-26 ENCOUNTER — Ambulatory Visit: Payer: BC Managed Care – PPO

## 2019-12-26 DIAGNOSIS — G8929 Other chronic pain: Secondary | ICD-10-CM | POA: Diagnosis not present

## 2019-12-26 DIAGNOSIS — M6281 Muscle weakness (generalized): Secondary | ICD-10-CM

## 2019-12-26 DIAGNOSIS — M25511 Pain in right shoulder: Secondary | ICD-10-CM | POA: Diagnosis not present

## 2019-12-26 NOTE — Therapy (Addendum)
[facility], [place], [address] Phone: [phone]   Fax:  [phone]  Physical Therapy Treatment/Discharge  Patient Details  Name: [name] MRN: [id number] Date of Birth: [birth year] Referring Provider (PT): Ellard Artis Corey,MD   Encounter Date: 12/26/2019  PT End of Session - 12/26/19 1404    Visit Number  2    Number of Visits  10    Date for PT Re-Evaluation  01/10/20    Authorization Type  BC[employer]    PT Start Time  0205    PT Stop Time  0252    PT Time Calculation (min)  47 min    Activity Tolerance  Patient tolerated treatment well    Behavior During Therapy  [facility] for tasks assessed/performed       Past Medical History:  Diagnosis Date  . Chest pain    stres test neg x 2, cath 5/12; minimal LAD irregs; no obs CAD; normal LVF  . Clotting disorder (HCC)    dvt  . GERD (gastroesophageal reflux disease)   . History of DVT of lower extremity    on chronic coumadin  . Hypertension   . Internal hemorrhoids    Cscope 2007  . PAD (peripheral artery disease) ([place])    a. left leg ischemia tx wtih embolectomy of left fem, pop, tib arteries with Dr. Scot Dock - 08/2006;  b. known occlusion of right pop with collats - med Rx;  c.ABI's 8/09: R 1.0; L 0.91   . PFO (patent foramen ovale)    per 08/2006 discharge summary, TEE showed EF 60%, small PFO with minimal right-to-left shunt with Valsalva; not felt to be the source of emboli, lifelong Coumadin recommended  . Pneumonia    history of  . Polysubstance abuse ([place])    marijuana only  . Renal infarct [facility])    R    Past Surgical History:  Procedure Laterality Date  . CHOLECYSTECTOMY    . COLONOSCOPY    . INGUINAL HERNIA REPAIR Left 07/16/2019   Procedure: LAPAROSCOPIC LEFT INGUINAL HERNIA REPAIR WITH MESH;  Surgeon: Ralene Ok, MD;  Location: [place];  Service: General;  Laterality: Left;  . left leg blood clot removal 2009  2009  . TONSILLECTOMY     . WRIST SURGERY      There were no vitals filed for this visit.  Subjective Assessment - 12/26/19 1405    Subjective  He reports no pain . Reports in jection helps alot    Currently in Pain?  No/denies                        [facility] Adult PT Treatment/Exercise - 12/26/19 0001      Exercises   Exercises  Shoulder      Shoulder Exercises: Standing   Extension  Both;10 reps    Theraband Level (Shoulder Extension)  Level 1 (Yellow)    Row  Both;10 reps    Theraband Level (Shoulder Row)  Level 1 (Yellow)      Shoulder Exercises: ROM/Strengthening   UBE (Upper Arm Bike)  L 1  5 min      Shoulder Exercises: Isometric Strengthening   Flexion  5X5"    Extension  5X5"    External Rotation  5X5"    Internal Rotation  5X5"    ABduction  5X5"       STW and gentle mobs to RT shoulder   MHP at end 8 min  RT shoulder   PT Education - 12/26/19 1447    Education Details  long discussion about exrcise and cold versus heat and when to use . discusses popping in shoulder. May be relatred to bicep tendon.   feels some what atrophied RT deltoid and pectorals    Person(s) Educated  Patient    Methods  Explanation    Comprehension  Verbalized understanding       PT Short Term Goals - 12/09/19 2836      PT SHORT TERM GOAL #1   Title  Independent with intial HEP    Time  2    Period  Weeks    Status  New        PT Long Term Goals - 12/09/19 6294      PT LONG TERM GOAL #1   Title  Independent with all HEP    Time  5    Period  Weeks    Status  New      PT LONG TERM GOAL #2   Title  He will impreove RT shouder strength to 4+/5 for normal home and work activity    Time  5    Period  Weeks    Status  New      PT LONG TERM GOAL #3   Title  He will report able to lift more at work (up to 15#) with less pain    Time  5    Period  Weeks    Status  New      PT LONG TERM GOAL #4   Title  FOTO improved to 31% limited    Time  5    Period  Weeks    Status   New            Plan - 12/26/19 1408    Clinical Impression Statement  He feels shoulder is better.  Soreness increases with  activity but he completes tasks.  Will progress HEP next session as tolerated.    PT Treatment/Interventions  Cryotherapy;Iontophoresis '4mg'$ /ml Dexamethasone;Ultrasound;Therapeutic exercise;Manual techniques;Passive range of motion;Dry needling;Patient/family education;Taping    PT Next Visit Plan  review HEP modalities and manual, Add tto HEP as able    PT Home Exercise Plan  isometrics ER/flex/abduct  wall slides    Consulted and Agree with Plan of Care  Patient       Patient will benefit from skilled therapeutic intervention in order to improve the following deficits and impairments:  Pain, Increased muscle spasms, Decreased strength  Visit Diagnosis: Chronic right shoulder pain  Muscle weakness (generalized)     Problem List Patient Active Problem List   Diagnosis Date Noted  . History of colonic polyps 11/04/2019  . PCP NOTES >>>>>>>>>>>>>> 07/26/2016  . GERD (gastroesophageal reflux disease) 07/26/2016  . Annual physical exam 07/25/2016  . HTN (hypertension) 09/16/2014  . Nausea without vomiting 11/29/2013  . Encounter for therapeutic drug monitoring 09/05/2013  . Deep vein thrombosis ([place]) 10/06/2010  . Long term current use of anticoagulant 10/06/2010  . Renal infarct ([place]) 10/06/2010  . Dyslipidemia 09/03/2009  . CHEST PAIN-UNSPECIFIED 09/03/2009  . SHOULDER PAIN, RIGHT 03/02/2009  . PERIPHERAL VASCULAR DISEASE 02/11/2009  . SKIN LESION 02/04/2008  . SUBSTANCE ABUSE, MULTIPLE 05/28/2007  . DISORDER, VASCULAR, KIDNEY 05/28/2007  . DVT, HX OF 12/25/2006    Darrel Hoover  PT 12/26/2019, 2:53 PM  [facility] [address] [place], [place], [address] Phone: [phone]   Fax:  [phone]  Name: [name] MRN: [id number] Date of Birth: 1960-10-07 PHYSICAL THERAPY  DISCHARGE SUMMARY  Visits from Start of Care: 2  Current functional level related to goals / functional outcomes: Unknown as he canceled last 2 appointments and did not return   Remaining deficits: Unknown   Education / Equipment: HEP Plan:                                                    Patient goals were not met. Patient is being discharged due to not returning since the last visit.  ?????   Pearson Forster PT  01/16/20

## 2019-12-30 ENCOUNTER — Ambulatory Visit: Payer: BC Managed Care – PPO | Admitting: Physical Therapy

## 2020-01-02 ENCOUNTER — Ambulatory Visit: Payer: BC Managed Care – PPO

## 2020-01-08 ENCOUNTER — Ambulatory Visit (HOSPITAL_BASED_OUTPATIENT_CLINIC_OR_DEPARTMENT_OTHER)
Admission: RE | Admit: 2020-01-08 | Discharge: 2020-01-08 | Disposition: A | Discharge status: Home / Self Care | Payer: BC Managed Care – PPO | Source: Ambulatory Visit | Attending: Internal Medicine | Admitting: Internal Medicine

## 2020-01-08 ENCOUNTER — Encounter: Payer: Self-pay | Admitting: Internal Medicine

## 2020-01-08 ENCOUNTER — Other Ambulatory Visit: Payer: Self-pay

## 2020-01-08 ENCOUNTER — Ambulatory Visit (INDEPENDENT_AMBULATORY_CARE_PROVIDER_SITE_OTHER): Payer: BC Managed Care – PPO | Admitting: Internal Medicine

## 2020-01-08 VITALS — BP 129/95 | HR 91 | Temp 97.6°F | Resp 18 | Ht 73.0 in | Wt 180.2 lb

## 2020-01-08 DIAGNOSIS — R634 Abnormal weight loss: Secondary | ICD-10-CM

## 2020-01-08 DIAGNOSIS — F1910 Other psychoactive substance abuse, uncomplicated: Secondary | ICD-10-CM

## 2020-01-08 DIAGNOSIS — Z125 Encounter for screening for malignant neoplasm of prostate: Secondary | ICD-10-CM

## 2020-01-08 LAB — COMPREHENSIVE METABOLIC PANEL
ALT: 83 U/L — ABNORMAL HIGH (ref 0–53)
AST: 83 U/L — ABNORMAL HIGH (ref 0–37)
Albumin: 4.6 g/dL (ref 3.5–5.2)
Alkaline Phosphatase: 77 U/L (ref 39–117)
BUN: 10 mg/dL (ref 6–23)
CO2: 27 mEq/L (ref 19–32)
Calcium: 9.9 mg/dL (ref 8.4–10.5)
Chloride: 102 mEq/L (ref 96–112)
Creatinine, Ser: 0.84 mg/dL (ref 0.40–1.50)
GFR: 114 mL/min (ref >60.00)
Glucose, Bld: 75 mg/dL (ref 70–99)
Potassium: 3.7 mEq/L (ref 3.5–5.1)
Sodium: 140 mEq/L (ref 135–145)
Total Bilirubin: 0.4 mg/dL (ref 0.2–1.2)
Total Protein: 6.7 g/dL (ref 6.0–8.3)

## 2020-01-08 LAB — URINALYSIS, ROUTINE W REFLEX MICROSCOPIC
Hgb urine dipstick: NEGATIVE
Leukocytes,Ua: NEGATIVE
Nitrite: NEGATIVE
Specific Gravity, Urine: >=1.03 — AB (ref 1.000–1.030)
Total Protein, Urine: 30 — AB
Urine Glucose: NEGATIVE
Urobilinogen, UA: 0.2 (ref 0.0–1.0)
pH: 6 (ref 5.0–8.0)

## 2020-01-08 LAB — CBC WITH DIFFERENTIAL/PLATELET
Basophils Absolute: 0 10*3/uL (ref 0.0–0.1)
Basophils Relative: 0.6 % (ref 0.0–3.0)
Eosinophils Absolute: 0.1 10*3/uL (ref 0.0–0.7)
Eosinophils Relative: 1.8 % (ref 0.0–5.0)
HCT: 41.6 % (ref 39.0–52.0)
Hemoglobin: 14.1 g/dL (ref 13.0–17.0)
Lymphocytes Relative: 26.1 % (ref 12.0–46.0)
Lymphs Abs: 1.1 10*3/uL (ref 0.7–4.0)
MCHC: 33.8 g/dL (ref 30.0–36.0)
MCV: 100.5 fl — ABNORMAL HIGH (ref 78.0–100.0)
Monocytes Absolute: 0.3 10*3/uL (ref 0.1–1.0)
Monocytes Relative: 7.4 % (ref 3.0–12.0)
Neutro Abs: 2.7 10*3/uL (ref 1.4–7.7)
Neutrophils Relative %: 64.1 % (ref 43.0–77.0)
Platelets: 204 10*3/uL (ref 150.0–400.0)
RBC: 4.14 Mil/uL — ABNORMAL LOW (ref 4.22–5.81)
RDW: 16 % — ABNORMAL HIGH (ref 11.5–15.5)
WBC: 4.3 10*3/uL (ref 4.0–10.5)

## 2020-01-08 LAB — IRON,TIBC AND FERRITIN PANEL
%SAT: 26 % (calc) (ref 20–48)
Ferritin: 256 ng/mL (ref 38–380)
Iron: 82 ug/dL (ref 50–180)
TIBC: 316 mcg/dL (calc) (ref 250–425)

## 2020-01-08 LAB — PSA: PSA: 1.74 ng/mL (ref 0.10–4.00)

## 2020-01-08 LAB — HEMOGLOBIN A1C: Hgb A1c MFr Bld: 6 % (ref 4.6–6.5)

## 2020-01-08 LAB — TSH: TSH: 1.62 u[IU]/mL (ref 0.35–4.50)

## 2020-01-08 NOTE — Patient Instructions (Addendum)
Please seek help at the [facility] center [phone]   [place] [facility] : Get the blood work     [place], [place] back for a checkup in 1 month   STOP BY THE FIRST FLOOR:  get the XR

## 2020-01-08 NOTE — Progress Notes (Signed)
Pre visit review using our clinic review tool, if applicable. No additional management support is needed unless otherwise documented below in the visit note. 

## 2020-01-08 NOTE — Progress Notes (Signed)
Subjective:    Patient ID: [name], male    DOB: [birth year], 59 y.o.   MRN: [id number]  DOS:  01/08/2020 Type of visit - description: Acute His main concern is weight loss and alcohol intake. He reports that he is under a lot of stress over the last 8 months, he started to drink even more alcohol than before. He still smokes cigarettes and uses marijuana. His weight used to be in the 200, 210 pounds up to last year and now is in the 180s.  He admits to daily nausea and vomiting, no hematemesis, typically he vomits a lot of mucus. He restarted PPIs and since then the symptoms have decreased to some extent. He also has some upper abdominal discomfort but is not severe.     Wt Readings from Last 3 Encounters:  01/08/20 180 lb 4 oz (81.8 kg)  12/20/19 177 lb (80.3 kg)  12/16/19 186 lb (84.4 kg)     Review of Systems Denies fever chills or night sweats Was recently seen with shortness of breath cardiology but today reports that is very mild. + Cough with occasional sputum production but no hemoptysis. No wheezing No dysuria, gross hematuria, difficulty urinating. 4 weeks ago for a couple of times so his semen was a slightly brown but not bloody.  Now is normal color. No headaches No major aches anywhere  Past Medical History:  Diagnosis Date  . Chest pain    stres test neg x 2, cath 5/12; minimal LAD irregs; no obs CAD; normal LVF  . Clotting disorder (HCC)    dvt  . GERD (gastroesophageal reflux disease)   . History of DVT of lower extremity    on chronic coumadin  . Hypertension   . Internal hemorrhoids    Cscope 2007  . PAD (peripheral artery disease) ([place])    a. left leg ischemia tx wtih embolectomy of left fem, pop, tib arteries with Dr. Scot Dock - 08/2006;  b. known occlusion of right pop with collats - med Rx;  c.ABI's 8/09: R 1.0; L 0.91   . PFO (patent foramen ovale)    per 08/2006 discharge summary, TEE showed EF 60%, small PFO with minimal right-to-left  shunt with Valsalva; not felt to be the source of emboli, lifelong Coumadin recommended  . Pneumonia    history of  . Polysubstance abuse ([place])    marijuana only  . Renal infarct [facility])    R    Past Surgical History:  Procedure Laterality Date  . CHOLECYSTECTOMY    . COLONOSCOPY    . INGUINAL HERNIA REPAIR Left 07/16/2019   Procedure: LAPAROSCOPIC LEFT INGUINAL HERNIA REPAIR WITH MESH;  Surgeon: Ralene Ok, MD;  Location: [place];  Service: General;  Laterality: Left;  . left leg blood clot removal 2009  2009  . TONSILLECTOMY    . WRIST SURGERY      Allergies as of 01/08/2020      Reactions   Pravastatin Itching   Simvastatin Itching   Lisinopril Rash   Tolerating rash currently      Medication List       Accurate as of January 08, 2020 10:07 AM. If you have any questions, ask your nurse or doctor.        enoxaparin 120 MG/0.8ML injection Commonly known as: LOVENOX Inject 0.8 mLs (120 mg total) into the skin daily.   esomeprazole 20 MG capsule Commonly known as: NEXIUM Take 20 mg by mouth daily as needed (acid reflux).  HYDROcodone-acetaminophen 5-325 MG tablet Commonly known as: NORCO/VICODIN Take 1 tablet by mouth every 6 (six) hours as needed for moderate pain.   lisinopril 10 MG tablet Commonly known as: ZESTRIL Take 1 tablet (10 mg total) by mouth daily.   warfarin 10 MG tablet Commonly known as: COUMADIN Take as directed by the anticoagulation clinic. If you are unsure how to take this medication, talk to your nurse or doctor. Original instructions: TAKE 1/2 TO 1 (ONE-HALF TO ONE) TABLET BY MOUTH ONCE DAILY AS  DIRECTED  BY  COUMADIN  CLINIC          Objective:   Physical Exam BP (!) 129/95 (BP Location: Left Arm, Patient Position: Sitting, Cuff Size: Small)   Pulse 91   Temp 97.6 F (36.4 C) (Temporal)   Resp 18   Ht 6\' 1"  (1.854 m)   Wt 180 lb 4 oz (81.8 kg)   SpO2 100%   BMI 23.78 kg/m   General: Well developed, NAD, BMI noted Neck: No   Thyromegaly Lymphatic system: No lymphadenopathies at the neck or axillary areas HEENT:  Normocephalic . Face symmetric, atraumatic Lungs:  CTA B Normal respiratory effort, no intercostal retractions, no accessory muscle use. Heart: RRR,  no murmur.  Abdomen:  Not distended, soft, non-tender. No rebound or rigidity.  No obvious organomegaly DRE: No stools found, normal sphincter tone, prostate normal, surrounding prostate structures also normal to palpation. Lower extremities: no pretibial edema bilaterally  Skin: Exposed areas without rash. Not pale. Not jaundice Neurologic:  alert & oriented X3.  Speech normal, gait appropriate for age and unassisted Strength symmetric and appropriate for age.  Psych: Cognition and judgment appear intact.  Cooperative with normal attention span and concentration.  Behavior appropriate. No anxious or depressed appearing.     Assessment     Assessment HTN Hyperlipidemia- statin intolerant  GERD -EGD 2002 and 2015 (due to nausea) : Gastritis, duodenal inflammation., bx benign, no H. pylori Chest pain:  stres test neg x 2, cath 5/12; minimal LAD irregs; no obs CAD; normal LVF; stress test: 01/16/2014: Low risk study Vascular, + Hypercoagulable state -DVT, leg on chronic Coumadin (per cardiology) PAD:    -2008: L leg ischemia >> embolectomy of left fem, pop, tib arteries ( Dr. Scot Dock ) -known occlusion of right pop with collats - med Rx - R renal infarct Polysubstance abuse Smoker    PLAN: Weight loss, polysubstance abuse: Losing weight for several months in the context of drinking more heavily than before (1 pint of liquor daily), smoking 1 pack a day, using marijuana daily.  Denies cocaine or other drugs. Review of system is + for nausea and vomiting early in the morning but no hematemesis. Had a recent colonoscopy. Physical exam is actually unremarkable. Although weight loss is likely related to heavy drinking and poor  food intake, or  other etiologies need to be rule out. Plan: CMP, CBC, A1c, TSH, PSA (DRE normal), TIBC, chest x-ray, UA. Further advised with results (CT lungs for cancer screening?  EGD?) Strongly encouraged treatment for polysubstance abuse.  [facility]? Cardiovascular Saw cardiology 12/20/2019, c/o SOB, physical exam was essentially negative, blood pressure was controlled, was Rx echo, pending. Colon polyps:  Colonoscopy 12/16/2019 to follow-up large polyps: Found 1 polyp otherwise normal, recheck colonoscopy 3 years RTC 1 month  This visit occurred during the SARS-CoV-2 public health emergency.  Safety protocols were in place, including screening questions prior to the visit, additional usage of staff PPE, and extensive cleaning  of exam room while observing appropriate contact time as indicated for disinfecting solutions.

## 2020-01-08 NOTE — Assessment & Plan Note (Signed)
Weight loss, polysubstance abuse: Losing weight for several months in the context of drinking more heavily than before (1 pint of liquor daily), smoking 1 pack a day, using marijuana daily.  Denies cocaine or other drugs. Review of system is + for nausea and vomiting early in the morning but no hematemesis. Had a recent colonoscopy. Physical exam is actually unremarkable. Although weight loss is likely related to heavy drinking and poor  food intake, or other etiologies need to be rule out. Plan: CMP, CBC, A1c, TSH, PSA (DRE normal), TIBC, chest x-ray, UA. Further advised with results (CT lungs for cancer screening?  EGD?) Strongly encouraged treatment for polysubstance abuse.  [facility]? Cardiovascular Saw cardiology 12/20/2019, c/o SOB, physical exam was essentially negative, blood pressure was controlled, was Rx echo, pending. Colon polyps:  Colonoscopy 12/16/2019 to follow-up large polyps: Found 1 polyp otherwise normal, recheck colonoscopy 3 years RTC 1 month

## 2020-01-13 ENCOUNTER — Encounter (HOSPITAL_COMMUNITY): Payer: Self-pay | Admitting: Cardiology

## 2020-01-13 ENCOUNTER — Other Ambulatory Visit (HOSPITAL_COMMUNITY): Payer: BC Managed Care – PPO

## 2020-01-13 NOTE — Progress Notes (Unsigned)
Patient ID: [name], male   DOB: 04/12/1961, 59 y.o.   MRN: [id number]   Verified appointment "no show" status with Sharyn Lull at 8:29am.

## 2020-01-16 ENCOUNTER — Telehealth (HOSPITAL_COMMUNITY): Payer: Self-pay | Admitting: Cardiovascular Disease

## 2020-01-16 NOTE — Telephone Encounter (Signed)
I called patient to reschedule his Echocardiogram that he No Showed on 01/13/20.  He informed me that he has started a new job and works from 7:15 am til 5:30 pm and working some Overtime also and is unable to reschedule his appointment at this time.  Patient states he will call the office at a later date to reschedule when he can.  I will removed Order from the [place] and when he calls back to reschedule we can Reinstate the order.

## 2020-01-27 ENCOUNTER — Other Ambulatory Visit: Payer: Self-pay

## 2020-01-27 ENCOUNTER — Ambulatory Visit (INDEPENDENT_AMBULATORY_CARE_PROVIDER_SITE_OTHER): Payer: BC Managed Care – PPO

## 2020-01-27 DIAGNOSIS — Z5181 Encounter for therapeutic drug level monitoring: Secondary | ICD-10-CM

## 2020-01-27 LAB — POCT INR: INR: 1.9 — AB (ref 2.0–3.0)

## 2020-01-27 NOTE — Patient Instructions (Signed)
Description   Take an extra 1/2 tablet today, then resume same dosage 1 tablet daily except for 1/2 tablet on Mondays and Wednesdays. Recheck INR in 4 weeks. Call with any new medications any procedures or any concerns. Coumadin Clinic [phone].

## 2020-02-14 ENCOUNTER — Telehealth: Payer: Self-pay | Admitting: Internal Medicine

## 2020-02-14 NOTE — Telephone Encounter (Signed)
Recommend to contact cardiology.

## 2020-02-14 NOTE — Telephone Encounter (Signed)
Patient states that on Warfin (blood thinner) woke up this morning and noticed a bruise on his arm.   Please advise

## 2020-02-14 NOTE — Telephone Encounter (Signed)
Notified patient to follow up with cardiology

## 2020-02-24 ENCOUNTER — Ambulatory Visit (INDEPENDENT_AMBULATORY_CARE_PROVIDER_SITE_OTHER): Payer: BC Managed Care – PPO

## 2020-02-24 ENCOUNTER — Other Ambulatory Visit: Payer: Self-pay

## 2020-02-24 DIAGNOSIS — Z5181 Encounter for therapeutic drug level monitoring: Secondary | ICD-10-CM | POA: Diagnosis not present

## 2020-02-24 LAB — POCT INR: INR: 1.8 — AB (ref 2.0–3.0)

## 2020-02-24 NOTE — Patient Instructions (Signed)
Take an extra 1/2 tablet today, then resume same dosage 1 tablet daily except for 1/2 tablet on Mondays and Wednesdays. Recheck INR in 2 weeks. Call with any new medications any procedures or any concerns. Coumadin Clinic [phone].

## 2020-03-13 ENCOUNTER — Other Ambulatory Visit: Payer: Self-pay

## 2020-03-13 ENCOUNTER — Ambulatory Visit (INDEPENDENT_AMBULATORY_CARE_PROVIDER_SITE_OTHER): Payer: BC Managed Care – PPO

## 2020-03-13 DIAGNOSIS — Z5181 Encounter for therapeutic drug level monitoring: Secondary | ICD-10-CM | POA: Diagnosis not present

## 2020-03-13 LAB — POCT INR: INR: 1.2 — AB (ref 2.0–3.0)

## 2020-03-13 MED ORDER — WARFARIN SODIUM 10 MG PO TABS: ORAL_TABLET | ORAL | 1 refills | Status: DC

## 2020-03-13 NOTE — Patient Instructions (Signed)
Description   Take 1.5 tablets today and tomorrow, then start taking 1 tablet daily except for 1/2 tablet on Wednesdays. Recheck INR in 2 weeks. Call with any new medications any procedures or any concerns. Coumadin Clinic [phone].

## 2020-03-27 ENCOUNTER — Ambulatory Visit (INDEPENDENT_AMBULATORY_CARE_PROVIDER_SITE_OTHER): Payer: BC Managed Care – PPO | Admitting: *Deleted

## 2020-03-27 ENCOUNTER — Other Ambulatory Visit: Payer: Self-pay

## 2020-03-27 ENCOUNTER — Telehealth: Payer: Self-pay | Admitting: Pharmacist

## 2020-03-27 DIAGNOSIS — Z5181 Encounter for therapeutic drug level monitoring: Secondary | ICD-10-CM

## 2020-03-27 LAB — POCT INR: INR: 1.9 — AB (ref 2.0–3.0)

## 2020-03-27 MED ORDER — APIXABAN 5 MG PO TABS: 5.0000 mg | ORAL_TABLET | Freq: Two times a day (BID) | ORAL | 5 refills | Status: DC

## 2020-03-27 NOTE — Telephone Encounter (Signed)
Pt returned call to clinic. He is aware to stop warfarin and start Eliquis 5mg  BID with first dose this evening. Copay card sent to pharmacy, confirmed $10 copay.

## 2020-03-27 NOTE — Telephone Encounter (Signed)
-----   Message from Sherren Mocha, MD sent at 03/27/2020  4:20 PM EDT ----- Regarding: RE: anticoag He would be better off on a DOAC. I'm not sure how his INR's run, but he's terribly noncompliant (drinks, smokes, etc). I'd be fine changing him to a DOAC ----- Message ----- From: Leeroy Bock, RPH-CPP Sent: 03/27/2020   4:11 PM EDT To: Sherren Mocha, MD Subject: anticoag                                       Hey Dr Burt Knack,  [name] came in for an INR check today and was asking about changing to a DOAC if possible. He takes warfarin for DVT, however has a history of PAD s/p left fem pop artery occlusion in 2008 that required embolectomy, renal infarct, and PFO as well. DVT indication would be on label for DOAC therapy, but would be off label for the rest of his hypercoagulable issues in the past. Thoughts?  Thanks, [employer]

## 2020-03-27 NOTE — Telephone Encounter (Signed)
Called pt and left message to discuss. INR was 1.9 today, can stop warfarin and start Eliquis with first dose this evening. Prefer to keep pt on 5mg  BID indefinitely due to renal infarct and embolectomy in addition to prior DVT. Will activate $10/month copay card.

## 2020-03-27 NOTE — Patient Instructions (Signed)
Description   Take 1.5 tablets today then start taking 1 tablet daily. Recheck INR in 3 weeks. Call with any new medications any procedures or any concerns. Coumadin Clinic [phone].

## 2020-08-08 IMAGING — MR MRI CERVICAL SPINE WITHOUT CONTRAST
5 of 10 series · 22 of 48 positions shown · IV contrast (agent unspecified)
Comparison: None available.

CLINICAL DATA: Initial evaluation for pain in shoulders, scapulae,
and neck. Recent injury lifting steel pipes.

EXAM:
MRI CERVICAL AND THORACIC SPINE WITHOUT AND WITH CONTRAST
TECHNIQUE: Multiplanar and multiecho pulse sequences of the cervical spine, to
include the craniocervical junction and cervicothoracic junction,
and the thoracic spine, were obtained without intravenous contrast.

[Series 4: T1 · sagittal · 3.0mm · 0.41mm/px · 2 of 13 slices shown]
[im 1/13]
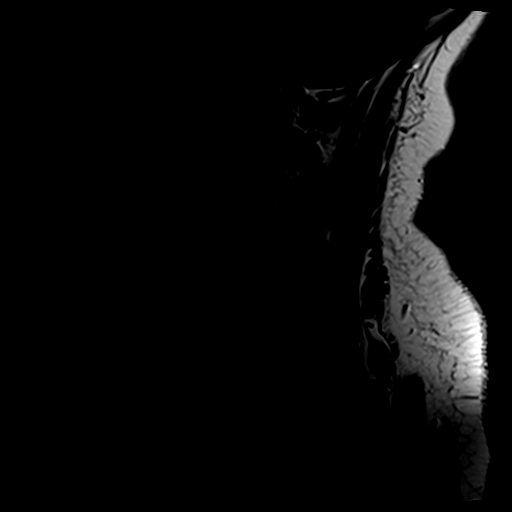
[im 5/13]
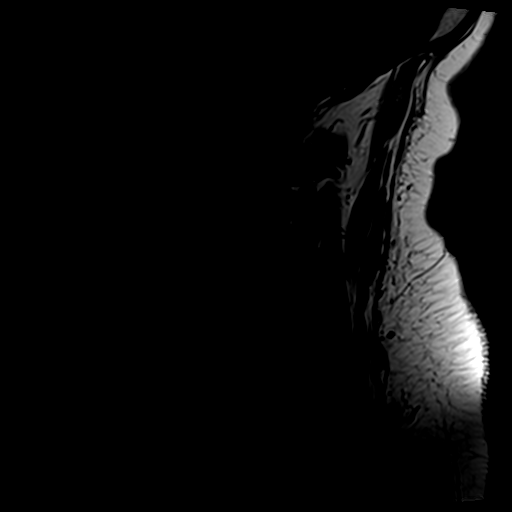

[Series 5: T2 · sagittal · 3.0mm · 0.66mm/px · 3 of 13 slices shown (1 of 4)]
[im 1/13]
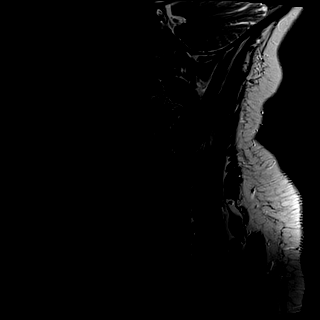
[im 7/13]
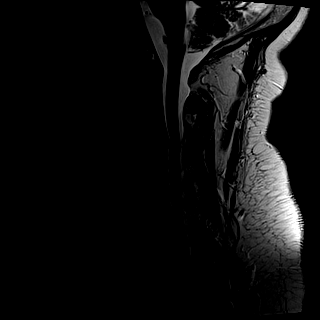
[im 13/13]
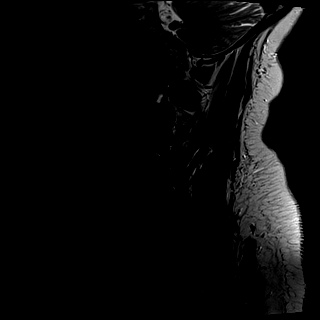

[Series 7: T2 · axial · 3.0mm · 0.70mm/px · z∈[-82,+17]mm · 7 of 28 slices shown (2 of 4)]
[im 1/28]
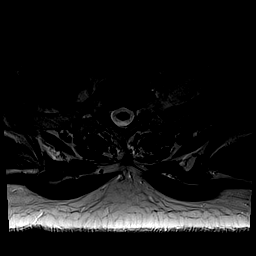
[im 5/28]
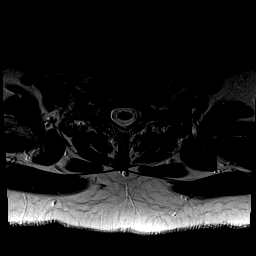
[im 10/28]
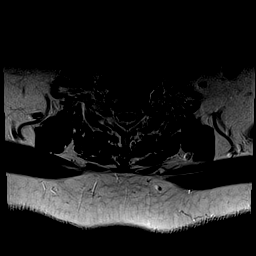
[im 14/28]
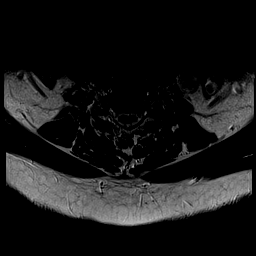
[im 19/28]
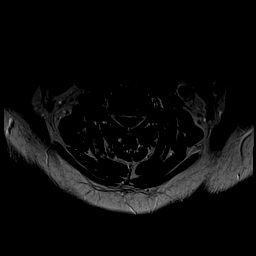
[im 23/28]
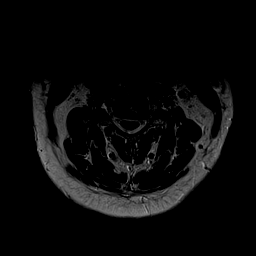
[im 28/28]
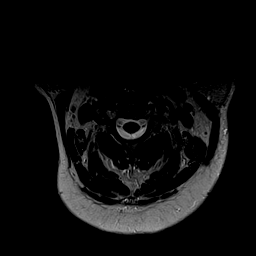

[Series 12: T2 · sagittal · 3.0mm · 0.47mm/px · 3 of 13 slices shown (3 of 4)]
[im 1/13]
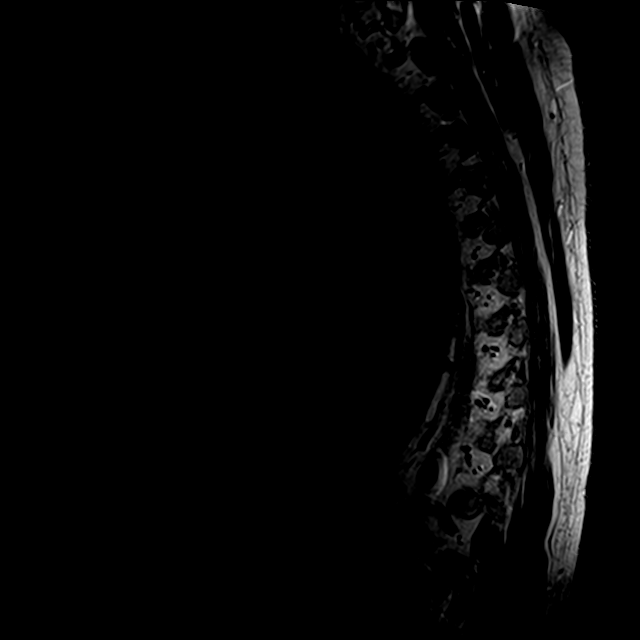
[im 7/13]
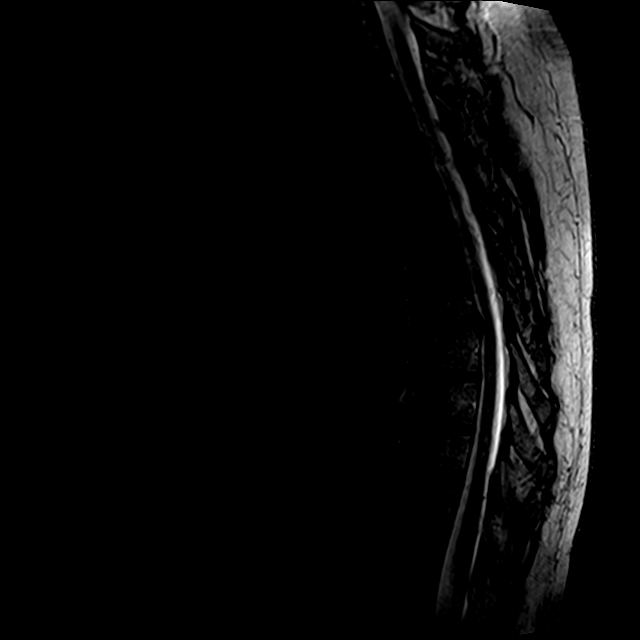
[im 13/13]
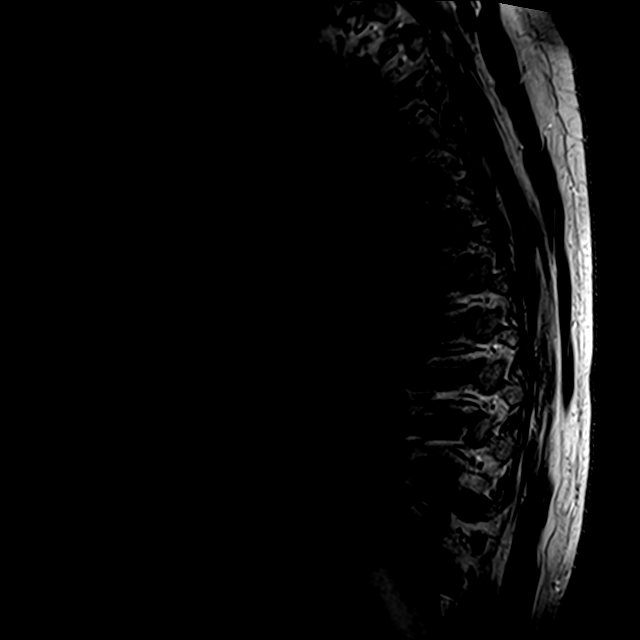

[Series 15: T2 · axial · 4.0mm · 0.39mm/px · z∈[-301,-93]mm · 7 of 28 slices shown (4 of 4)]
[im 1/28]
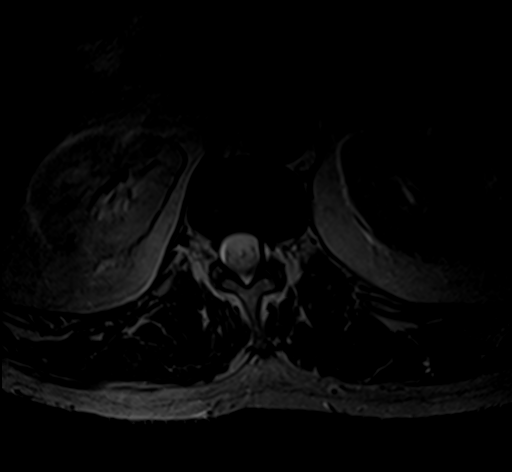
[im 5/28]
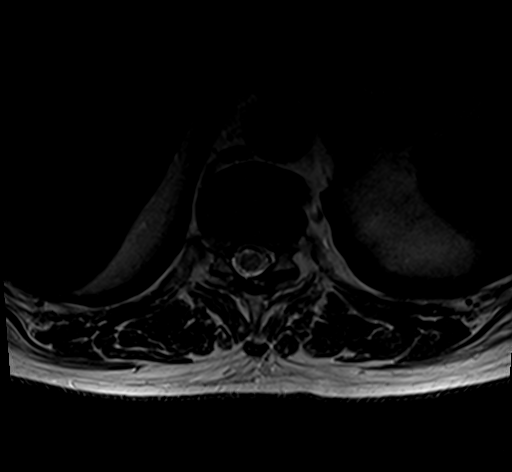
[im 10/28]
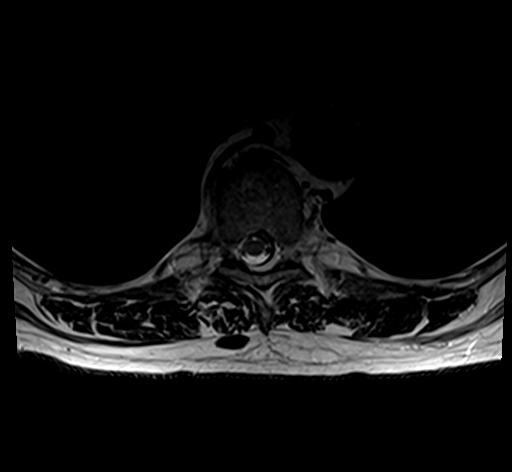
[im 14/28]
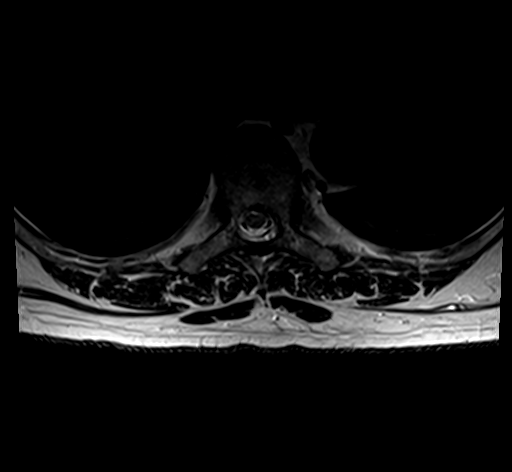
[im 19/28]
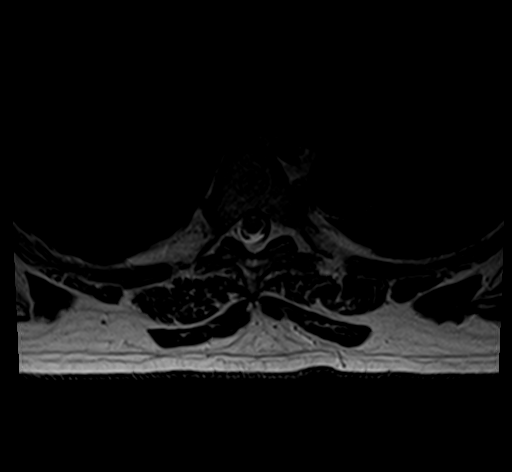
[im 23/28]
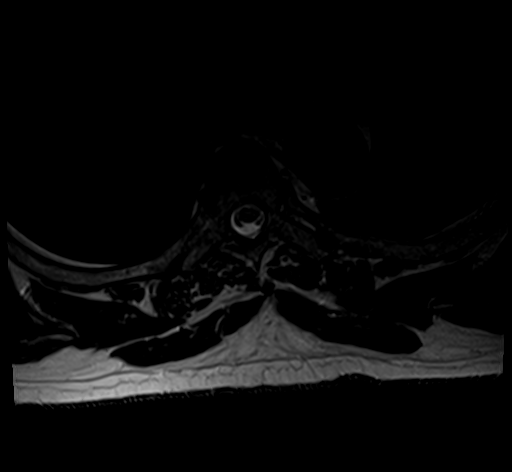
[im 28/28]
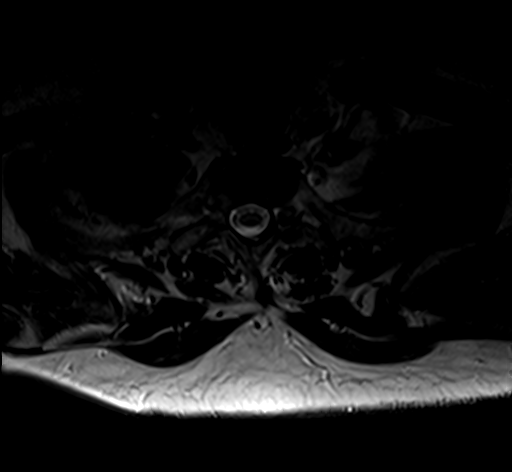

[22 of 48 positions shown; findings below may reference images not displayed]

FINDINGS: MRI CERVICAL SPINE FINDINGS

Alignment: Straightening with trace reversal of the normal cervical
lordosis. No listhesis or malalignment.

Vertebrae: Vertebral body heights are maintained without evidence
for acute or chronic fracture. Bone marrow signal intensity within
normal limits. No discrete or worrisome osseous lesions. No abnormal
marrow edema.

Cord: Signal intensity within the cervical spinal cord is normal.
Normal cord caliber and morphology.

Posterior Fossa, vertebral arteries, paraspinal tissues: Visualized
brain and posterior fossa are within normal limits. Craniocervical
junction normal. Paraspinous and prevertebral soft tissues are
normal. Normal intravascular flow voids seen within the vertebral
arteries bilaterally. Right vertebral artery dominant.

Disc levels:

C2-C3: Minimal disc bulge with uncovertebral hypertrophy. No
significant canal or foraminal stenosis.

C3-C4: Circumferential disc osteophyte with intervertebral disc
space narrowing. Broad posterior component consisting mostly of soft
disc material flattens and effaces the ventral thecal sac, greater
on the left. Mild flattening of the ventral cord without cord signal
changes. Mild spinal stenosis. Superimposed mild left-sided facet
hypertrophy. Resultant moderate left with mild to moderate right C4
foraminal stenosis.

C4-C5: Circumferential disc osteophyte with intervertebral disc
space narrowing. Flattening of the ventral thecal sac with resultant
mild spinal stenosis. No cord deformity. Superimposed mild facet
hypertrophy, slightly greater on the left. Moderate bilateral C5
foraminal stenosis.

C5-C6: Diffuse disc osteophyte with intervertebral disc space
narrowing. Right greater than left uncovertebral spurring.
Flattening of the ventral thecal sac with resultant mild spinal
stenosis. No cord deformity. Moderate right worse than left C6
foraminal narrowing.

C6-C7: Diffuse degenerative disc osteophyte, slightly asymmetric to
the right, with right greater than left uncovertebral hypertrophy.
Broad posterior component flattens the ventral thecal sac resultant
mild spinal stenosis. No cord deformity. Severe right worse than
left C7 foraminal stenosis.

C7-T1:  Mild facet hypertrophy.  No stenosis or impingement.

MRI THORACIC SPINE FINDINGS

Alignment: Mild left convex scoliosis. Alignment otherwise normal
with preservation of the normal thoracic kyphosis. No listhesis or
subluxation.

Vertebrae: Vertebral body heights well maintained without evidence
for acute or chronic fracture. Bone marrow signal intensity within
normal limits. Few small benign hemangiomas noted, most notable of
which measures 1 cm at the posterior aspect of the T8 vertebral
body. No other worrisome osseous lesions. No abnormal marrow edema.

Cord: Signal intensity within the thoracic spinal cord is normal.
Normal cord caliber and morphology.

Paraspinal and other soft tissues: Paraspinous soft tissues within
normal limits. Partially visualized lungs are clear. Visualized
visceral structures unremarkable.

Disc levels:

T1-2:  Unremarkable.

T2-3: Unremarkable.

T3-4:  Unremarkable.

T4-5:  Unremarkable.

T5-6:  Unremarkable.

T6-7:  Unremarkable.

T7-8: Central/left paracentral disc protrusion indents the ventral
thecal sac, indenting the ventral thoracic cord without cord signal
changes (series 14, image 14). Mild spinal stenosis. Superimposed
mild facet hypertrophy bilaterally. No significant foraminal
narrowing.

T8-9: Tiny central disc protrusion flattens the ventral thecal sac
without significant stenosis (series 14, image 17). Mild facet
hypertrophy. Resultant mild right foraminal narrowing.

T9-10: Unremarkable.

T10-11:  Unremarkable.

T11-12:  Unremarkable.

T12-L1:  Unremarkable.
IMPRESSION: MRI CERVICAL SPINE IMPRESSION

1. Moderate multilevel cervical spondylolysis at C3-4 through C6-7
with resultant mild diffuse spinal stenosis, most notable at C3-4.
2. Multifactorial degenerative changes with resultant multilevel
foraminal narrowing as above. Notable findings include moderate left
C4 foraminal stenosis, with moderate bilateral C5 and C6 foraminal
narrowing, with severe bilateral C7 foraminal stenosis. See above
discussion for a full description of these findings.

MRI THORACIC SPINE IMPRESSION

1. Central/left paracentral disc protrusion at T7-8 indenting upon
the ventral thoracic cord without cord signal changes. Mild spinal
stenosis at this level.
2. Minimal disc bulge at T8-9 without spinal stenosis.
3. Bilateral facet hypertrophy at T7-8 and T8-9. Resultant mild
right foraminal narrowing at T8-9.

## 2020-09-01 ENCOUNTER — Other Ambulatory Visit: Payer: BC Managed Care – PPO

## 2020-10-16 ENCOUNTER — Encounter: Payer: Self-pay | Admitting: Internal Medicine

## 2020-10-16 ENCOUNTER — Other Ambulatory Visit: Payer: Self-pay | Admitting: Cardiovascular Disease

## 2020-10-16 NOTE — Telephone Encounter (Signed)
Prescription refill request for Eliquis received.  Hx: renal infarct and embolectomy and dvt Last office visit: 12/20/2019, [place] Scr: 0.84, 01/08/2020 Age: 60 yo  Weight: 81.8 kg   Pt is on the correct dose of Eliquis per dosing criteria, prescription refill sent for Eliquis 5mg  BID.

## 2020-12-05 ENCOUNTER — Other Ambulatory Visit: Payer: Self-pay | Admitting: Cardiovascular Disease

## 2020-12-05 DIAGNOSIS — I739 Peripheral vascular disease, unspecified: Secondary | ICD-10-CM

## 2020-12-05 DIAGNOSIS — I10 Essential (primary) hypertension: Secondary | ICD-10-CM

## 2021-01-09 ENCOUNTER — Other Ambulatory Visit: Payer: Self-pay | Admitting: Cardiovascular Disease

## 2021-01-09 DIAGNOSIS — I739 Peripheral vascular disease, unspecified: Secondary | ICD-10-CM

## 2021-01-09 DIAGNOSIS — I10 Essential (primary) hypertension: Secondary | ICD-10-CM

## 2021-02-01 ENCOUNTER — Other Ambulatory Visit: Payer: Self-pay | Admitting: Cardiovascular Disease

## 2021-02-01 DIAGNOSIS — I10 Essential (primary) hypertension: Secondary | ICD-10-CM

## 2021-02-01 DIAGNOSIS — I739 Peripheral vascular disease, unspecified: Secondary | ICD-10-CM

## 2021-02-05 ENCOUNTER — Ambulatory Visit (INDEPENDENT_AMBULATORY_CARE_PROVIDER_SITE_OTHER): Payer: BC Managed Care – PPO | Admitting: Family Medicine

## 2021-02-05 ENCOUNTER — Other Ambulatory Visit: Payer: Self-pay

## 2021-02-05 ENCOUNTER — Encounter: Payer: Self-pay | Admitting: Family Medicine

## 2021-02-05 VITALS — BP 112/76 | HR 88 | Temp 97.6°F | Resp 17 | Ht 73.0 in | Wt 180.0 lb

## 2021-02-05 DIAGNOSIS — Z7251 High risk heterosexual behavior: Secondary | ICD-10-CM

## 2021-02-05 LAB — POC URINALSYSI DIPSTICK (AUTOMATED)
Bilirubin, UA: NEGATIVE
Blood, UA: NEGATIVE
Glucose, UA: NEGATIVE
Ketones, UA: NEGATIVE
Leukocytes, UA: NEGATIVE
Nitrite, UA: NEGATIVE
Protein, UA: POSITIVE — AB
Spec Grav, UA: 1.025 (ref 1.010–1.025)
Urobilinogen, UA: 0.2 E.U./dL
pH, UA: 6 (ref 5.0–8.0)

## 2021-02-05 NOTE — Assessment & Plan Note (Signed)
Std check  Penile swab done Doubt penile lesion is hsv----if lesion does not resolve--- refer to derm

## 2021-02-05 NOTE — Progress Notes (Signed)
Patient ID: [name], male    DOB: 1961/01/15  Age: 60 y.o. MRN: [id number]    Subjective:  Subjective  HPI [name] presents for an office visit today. He complains of L testicular pain and genital sores x 1 week. He notes that there is a bump on his L testicle. He states that he has similar Sxs when he was [age] on the shaft of his penis and last year on his testicles. He reports it appears like an ingrown hair when he was 18-17. He notes that he is promiscuous when he was younger. He reports that he had received oral sex x 1 week.  However, he denies any sores on/in his partner's mouth. He states that he has been under more stress lately.  He denies any chest pain, SOB, fever, abdominal pain, cough, chills, sore throat, dysuria, urinary incontinence, back pain, HA, or N/V/D at this time. No penile d/c.  Review of Systems  Constitutional:  Negative for chills, fatigue and fever.  HENT:  Negative for ear pain, rhinorrhea, sinus pressure, sinus pain, sore throat and tinnitus.   Eyes:  Negative for pain.  Respiratory:  Negative for cough, shortness of breath and wheezing.   Cardiovascular:  Negative for chest pain.  Gastrointestinal:  Negative for abdominal pain, anal bleeding, constipation, diarrhea, nausea and vomiting.  Genitourinary:  Positive for genital sores and testicular pain. Negative for flank pain.  Musculoskeletal:  Negative for back pain and neck pain.  Skin:  Negative for rash.  Neurological:  Negative for seizures, weakness, light-headedness, numbness and headaches.   History Past Medical History:  Diagnosis Date   Chest pain    stres test neg x 2, cath 5/12; minimal LAD irregs; no obs CAD; normal LVF   Clotting disorder (HCC)    dvt   GERD (gastroesophageal reflux disease)    History of DVT of lower extremity    on chronic coumadin   Hypertension    Internal hemorrhoids    Cscope 2007   PAD (peripheral artery disease) ([place])    a. left leg  ischemia tx wtih embolectomy of left fem, pop, tib arteries with Dr. Scot Dock - 08/2006;  b. known occlusion of right pop with collats - med Rx;  c.ABI's 8/09: R 1.0; L 0.91    PFO (patent foramen ovale)    per 08/2006 discharge summary, TEE showed EF 60%, small PFO with minimal right-to-left shunt with Valsalva; not felt to be the source of emboli, lifelong Coumadin recommended   Pneumonia    history of   Polysubstance abuse ([place])    marijuana only   Renal infarct ([place])    R    He has a past surgical history that includes Cholecystectomy; left leg blood clot removal 2009 (2009); Tonsillectomy; Wrist surgery; Colonoscopy; and Inguinal hernia repair (Left, 07/16/2019).   His family history includes Breast cancer in his mother; CAD in his father; Diabetes in his father; Heart disease in his paternal grandmother; Hypertension in his father and mother; Lung cancer in his maternal uncle; Pancreatic cancer in his brother; Prostate cancer in his maternal uncle.He reports that he has been smoking cigarettes. He has a 30.00 pack-year smoking history. He has never used smokeless tobacco. He reports current alcohol use. He reports current drug use. Drug: Marijuana.  Current Outpatient Medications on File Prior to Visit  Medication Sig Dispense Refill   ELIQUIS 5 MG TABS tablet Take 1 tablet by mouth twice daily 60 tablet 5   lisinopril (  ZESTRIL) 10 MG tablet Take 1 tablet (10 mg total) by mouth daily. Keep follow up appointment in September 2022 to continue receiving refills. Thank you. 30 tablet 1   No current facility-administered medications on file prior to visit.     Objective:  Objective  Physical Exam Vitals and nursing note reviewed.  Constitutional:      General: He is not in acute distress.    Appearance: Normal appearance. He is well-developed. He is not ill-appearing.  HENT:     Head: Normocephalic and atraumatic.     Right Ear: External ear normal.     Left Ear: External ear normal.      Nose: Nose normal.  Eyes:     General:        Right eye: No discharge.        Left eye: No discharge.     Extraocular Movements: Extraocular movements intact.     Pupils: Pupils are equal, round, and reactive to light.  Cardiovascular:     Rate and Rhythm: Normal rate and regular rhythm.     Pulses: Normal pulses.     Heart sounds: Normal heart sounds. No murmur heard.   No friction rub. No gallop.  Pulmonary:     Effort: Pulmonary effort is normal. No respiratory distress.     Breath sounds: Normal breath sounds. No stridor. No wheezing, rhonchi or rales.  Chest:     Chest wall: No tenderness.  Abdominal:     General: Bowel sounds are normal. There is no distension.     Palpations: Abdomen is soft. There is no mass.     Tenderness: There is no abdominal tenderness. There is no guarding or rebound.     Hernia: No hernia is present.  Genitourinary:    Comments: There is a cyst with white head present on the penis. Penile swab for gc/ chlamydia done  Musculoskeletal:        General: Normal range of motion.     Cervical back: Normal range of motion and neck supple.     Right lower leg: No edema.     Left lower leg: No edema.  Skin:    General: Skin is warm and dry.  Neurological:     Mental Status: He is alert and oriented to person, place, and time.  Psychiatric:        Behavior: Behavior normal.        Thought Content: Thought content normal.   BP 112/76 (BP Location: Right Arm, Patient Position: Sitting, Cuff Size: Normal)   Pulse 88   Temp 97.6 F (36.4 C)   Resp 17   Ht 6\' 1"  (1.854 m)   Wt 180 lb (81.6 kg)   SpO2 97%   BMI 23.75 kg/m  Wt Readings from Last 3 Encounters:  02/05/21 180 lb (81.6 kg)  01/08/20 180 lb 4 oz (81.8 kg)  12/20/19 177 lb (80.3 kg)     Lab Results  Component Value Date   WBC 4.3 01/08/2020   HGB 14.1 01/08/2020   HCT 41.6 01/08/2020   PLT 204.0 01/08/2020   GLUCOSE 75 01/08/2020   CHOL 241 (H) 07/25/2016   TRIG 179.0 (H)  07/25/2016   HDL 51.20 07/25/2016   LDLDIRECT 143.7 11/24/2008   LDLCALC 154 (H) 07/25/2016   ALT 83 (H) 01/08/2020   AST 83 (H) 01/08/2020   NA 140 01/08/2020   K 3.7 01/08/2020   CL 102 01/08/2020   CREATININE 0.84 01/08/2020   BUN  10 01/08/2020   CO2 27 01/08/2020   TSH 1.62 01/08/2020   PSA 1.74 01/08/2020   INR 1.9 (A) 03/27/2020   HGBA1C 6.0 01/08/2020    DG Chest 2 View  Result Date: 01/08/2020 CLINICAL DATA:  Weight loss EXAM: CHEST - 2 VIEW COMPARISON:  08/13/2018 FINDINGS: The heart size and mediastinal contours are within normal limits. Both lungs are clear. Disc degenerative disease of the thoracic spine. IMPRESSION: No acute abnormality of the lungs. Electronically Signed   By: Eddie Candle M.D.   On: 01/08/2020 16:51     Assessment & Plan:  Plan   No orders of the defined types were placed in this encounter.   Problem List Items Addressed This Visit   None Visit Diagnoses     High risk heterosexual behavior    -  Primary   Relevant Orders   HIV Antibody (routine testing w rflx)   HSV(herpes simplex vrs) 1+2 ab-IgG   RPR   Hepatitis B surface antigen   Hepatitis C antibody   POCT Urinalysis Dipstick (Automated) (Completed)   C. trachomatis/N. gonorrhoeae RNA       Follow-up: Return if symptoms worsen or fail to improve.   I,Gordon Zheng,acting as a Education administrator for [employer], DO.,have documented all relevant documentation on the behalf of Ann Held, DO,as directed by  Ann Held, DO while in the presence of [place], DO, have reviewed all documentation for this visit. The documentation on 02/05/21 for the exam, diagnosis, procedures, and orders are all accurate and complete.

## 2021-02-05 NOTE — Patient Instructions (Signed)
Preventing Sexually Transmitted Infections, Adult  Sexually transmitted infections (STIs) are diseases that are spread from person to person (are contagious). They are spread, or transmitted, through bodily fluids exchanged during sex or sexual contact. These bodily fluids include saliva, semen, blood, vaginal mucus, and urine. STIs are very common among people of all ages.  Some common STIs include:  Herpes.  Hepatitis B.  Chlamydia.  Gonorrhea.  Syphilis.  HPV (human papillomavirus).  HIV, also called the human immunodeficiency virus. This is the virus that can cause AIDS (acquired immunodeficiency syndrome).  Often, people who have these STIs do not have symptoms. Even without symptoms, these infections can be spread from person to person and require treatment.  How can these conditions affect me?  STIs can be treated, and many STIs can be cured. However, some STIs cannot be cured and will affect you for the rest of your life.  Certain STIs may:  Require you to take medicine for the rest of your life.  Affect your ability to have children (your fertility).  Increase your risk for developing another STI or certain serious health conditions. These may include:  Cervical cancer.  Head and neck cancer.  Pelvic inflammatory disease (PID), in women.  Organ damage or damage to other parts of your body, if the infection spreads.  Cause problems during pregnancy and may be transmitted to the baby during the pregnancy or childbirth.  What can increase my risk?  You may have an increased risk for developing an STI if:  You have unprotected sex. Sex includes oral, vaginal, or anal sex.  You have more than one sex partner.  You have a sex partner who has multiple sex partners.  You have sex with someone who has an STI.  You have an STI or you had an STI before.  You inject drugs or have a sex partner or partners who inject drugs.  What actions can I take to prevent STIs?  The only way to completely prevent STIs is not to have  sex of any kind. This is called practicing abstinence. If you are sexually active, you can protect yourself and others by taking these actions to lower your risk of getting an STI:  Lifestyle  Avoid mixing alcohol, drugs, and sex. Alcohol and drug use can affect your ability to make good decisions and can lead to risky sexual behaviors.  Medicines  Ask your health care provider about taking pre-exposure prophylaxis (PrEP) to prevent HIV infection.  General information    Stay up to date on vaccinations. Certain vaccines can lower your risk of getting certain STIs, such as:  Hepatitis A and hepatitis B vaccines. You may have been vaccinated as a young child, but you will likely need a booster shot as a teen or young adult.  HPV (human papillomavirus) vaccine.  Have only one sex partner (be monogamous) or limit the number of sex partners you have.  Use methods that prevent the exchange of body fluids between partners (barrier protection) correctly every time you have sex. Barrier protection can be used during oral, vaginal, or anal sex. Commonly used barrier methods include:  Male condom.  Male condom.  Dental dam.  Use a new condom for every sex act from start to finish.  Get tested for STIs. Have your partners get tested, too.  If you test positive for an STI, follow recommendations from your health care provider about treatment and make sure your sex partners are tested and treated.  Birth control   pills, injections, implants, and intrauterine devices (IUDs) do not protect against STIs. To prevent both STIs and pregnancy, always use a condom with another form of birth control.  Some STIs, such as herpes, are spread through skin-to-skin contact. A condom may not protect you from getting such STIs. Avoid all sexual contact if you or your partners have herpes and there is an active flare with open sores.  Where to find more information  Learn more about STIs from:  Centers for Disease Control and Prevention:  More  information about specific STIs: cdc.gov/std  Places to get sexual health counseling and treatment for free or at a low cost: gettested.cdc.gov  U.S. Department of Health and Human Services: www.womenshealth.gov  Summary  Sexually transmitted infections (STIs) can spread through exchanging bodily fluids during sexual contact. Fluids include saliva, semen, blood, vaginal mucus, and urine.  You may have an increased risk for developing an STI if you have unprotected sex. Sex includes oral, vaginal, or anal sex.  If you do have sex, limit your number of sex partners and use barrier protection every time you have sex.  This information is not intended to replace advice given to you by your health care provider. Make sure you discuss any questions you have with your health care provider.  Document Revised: 09/09/2019 Document Reviewed: 09/09/2019  Elsevier Patient Education  2022 Elsevier Inc.

## 2021-02-08 LAB — C. TRACHOMATIS/N. GONORRHOEAE RNA
C. trachomatis RNA, TMA: NOT DETECTED
N. gonorrhoeae RNA, TMA: NOT DETECTED

## 2021-02-09 LAB — HSV(HERPES SIMPLEX VRS) I + II AB-IGG
HAV 1 IGG,TYPE SPECIFIC AB: 37.5 index — ABNORMAL HIGH
HSV 2 IGG,TYPE SPECIFIC AB: <0.9 index

## 2021-02-09 LAB — HEPATITIS C ANTIBODY
Hepatitis C Ab: NONREACTIVE
SIGNAL TO CUT-OFF: 0 (ref <1.00)

## 2021-02-09 LAB — RPR: RPR Ser Ql: NONREACTIVE

## 2021-02-09 LAB — HIV ANTIBODY (ROUTINE TESTING W REFLEX): HIV 1&2 Ab, 4th Generation: NONREACTIVE

## 2021-02-09 LAB — HEPATITIS B SURFACE ANTIGEN: Hepatitis B Surface Ag: NONREACTIVE

## 2021-02-15 ENCOUNTER — Telehealth: Payer: Self-pay

## 2021-02-15 NOTE — Telephone Encounter (Signed)
Pt has called to get the rest of his lab results from 02/05/21. I have informed him of them and answered his questions. He stated understanding.

## 2021-03-05 ENCOUNTER — Telehealth: Payer: Self-pay | Admitting: Cardiovascular Disease

## 2021-03-05 MED ORDER — APIXABAN 5 MG PO TABS: 5.0000 mg | ORAL_TABLET | Freq: Two times a day (BID) | ORAL | 5 refills | Status: DC

## 2021-03-05 NOTE — Telephone Encounter (Signed)
Patient has commercial [employer] so should qualify for a copay card.  Recommend patient go to [email or web address], click on "activate copay card" and complete questions.  If he needs help please let us know.

## 2021-03-05 NOTE — Telephone Encounter (Signed)
Pt c/o medication issue:  1. Name of Medication: ELIQUIS 5 MG TABS tablet  2. How are you currently taking this medication (dosage and times per day)? As directed   3. Are you having a reaction (difficulty breathing--STAT)?   4. What is your medication issue? Cost  Patient wanted to know if there were any discount cards that he could use to help cover the cost. He changed insurances and it will cost him more with his new plan.  Patient calling the office for samples of medication:   1.  What medication and dosage are you requesting samples for? ELIQUIS 5 MG TABS tablet  2.  Are you currently out of this medication?  Yes. The patient has been taking his Daughter's medication

## 2021-03-05 NOTE — Telephone Encounter (Signed)
Spoke with the patient who states that he has run out of his Eliquis and has been taking his daughter's. He states that he is unable to afford his Eliquis. Advised him that I would let our pharmacy team know and see if they could get him any assistance with paying for his Eliquis.  I have sent patient in a refill for 15 days and advised him to pick that up in the mean time. Patient verbalized understanding.

## 2021-03-08 NOTE — Telephone Encounter (Signed)
Spoke with the patient and advised him on recommendations from PharmD. Patient verbalized understanding.

## 2021-03-30 ENCOUNTER — Encounter: Payer: Self-pay | Admitting: Internal Medicine

## 2021-03-30 ENCOUNTER — Telehealth (INDEPENDENT_AMBULATORY_CARE_PROVIDER_SITE_OTHER): Payer: BC Managed Care – PPO | Admitting: Internal Medicine

## 2021-03-30 VITALS — Ht 73.0 in | Wt 190.0 lb

## 2021-03-30 DIAGNOSIS — U071 COVID-19: Secondary | ICD-10-CM

## 2021-03-30 MED ORDER — MOLNUPIRAVIR EUA 200MG CAPSULE: 4.0000 | ORAL_CAPSULE | Freq: Two times a day (BID) | ORAL | 0 refills | Status: AC

## 2021-03-30 NOTE — Progress Notes (Signed)
Subjective:    Patient ID: [name], male    DOB: [birth year], 59 y.o.   MRN: [id number]  DOS:  03/30/2021 Type of visit - description: Virtual Visit via Video Note  I connected with the above patient  by a video enabled telemedicine application and verified that I am speaking with the correct person using two identifiers.   THIS ENCOUNTER IS A VIRTUAL VISIT DUE TO COVID-19 - PATIENT WAS NOT SEEN IN THE OFFICE. PATIENT HAS CONSENTED TO VIRTUAL VISIT / TELEMEDICINE VISIT   Location of patient: home  Location of provider: office  Persons participating in the virtual visit: patient, provider   I discussed the limitations of evaluation and management by telemedicine and the availability of in person appointments. The patient expressed understanding and agreed to proceed.    Acute Symptoms a started yesterday: Develop mild nausea diarrhea, went to the restroom several times. He also developed a headache and mild abdominal discomfort. Had a COVID test at home: Negative. This morning, he woke up with similar symptoms and some cough. Got another COVID test and it came back positive.  He denies blood in the stools, no lower extremity edema. Denies difficulty breathing but admits to some chest pain: The chest pain is in the middle of the chest, worse with certain movements, like a cramp, this is actually not new, this is going on for a while. Denies sore throat to me.    Review of Systems See above   Past Medical History:  Diagnosis Date   Chest pain    stres test neg x 2, cath 5/12; minimal LAD irregs; no obs CAD; normal LVF   Clotting disorder (HCC)    dvt   GERD (gastroesophageal reflux disease)    History of DVT of lower extremity    on chronic coumadin   Hypertension    Internal hemorrhoids    Cscope 2007   PAD (peripheral artery disease) ([place])    a. left leg ischemia tx wtih embolectomy of left fem, pop, tib arteries with Dr. Scot Dock - 08/2006;  b. known occlusion  of right pop with collats - med Rx;  c.ABI's 8/09: R 1.0; L 0.91    PFO (patent foramen ovale)    per 08/2006 discharge summary, TEE showed EF 60%, small PFO with minimal right-to-left shunt with Valsalva; not felt to be the source of emboli, lifelong Coumadin recommended   Pneumonia    history of   Polysubstance abuse ([place])    marijuana only   Renal infarct ([place])    R    Past Surgical History:  Procedure Laterality Date   CHOLECYSTECTOMY     COLONOSCOPY     INGUINAL HERNIA REPAIR Left 07/16/2019   Procedure: LAPAROSCOPIC LEFT INGUINAL HERNIA REPAIR WITH MESH;  Surgeon: Ralene Ok, MD;  Location: [facility];  Service: General;  Laterality: Left;   left leg blood clot removal 2009  2009   TONSILLECTOMY     WRIST SURGERY      Allergies as of 03/30/2021       Reactions   Pravastatin Itching   Simvastatin Itching   Lisinopril Rash   Tolerating rash currently        Medication List        Accurate as of March 30, 2021  4:37 PM. If you have any questions, ask your nurse or doctor.          apixaban 5 MG Tabs tablet Commonly known as: Eliquis Take 1 tablet (5  mg total) by mouth 2 (two) times daily.   lisinopril 10 MG tablet Commonly known as: ZESTRIL Take 1 tablet (10 mg total) by mouth daily. Keep follow up appointment in September 2022 to continue receiving refills. Thank you.           Objective:   Physical Exam Ht '6\' 1"'$  (1.854 m)   Wt 190 lb (86.2 kg)   BMI 25.07 kg/m  This is a virtual video visit, he is resting in his bed, alert oriented x3, in no distress, no cough noted.  No blood pressure or O2 sats available. He speaks in complete sentences    Assessment     Assessment HTN Hyperlipidemia- statin intolerant  GERD -EGD 2002 and 2015 (due to nausea) : Gastritis, duodenal inflammation., bx benign, no H. pylori Chest pain:  stres test neg x 2, cath 5/12; minimal LAD irregs; no obs CAD; normal LVF; stress test: 01/16/2014: Low risk study Vascular,  + Hypercoagulable state -DVT, leg on chronic Coumadin (per cardiology) PAD:    -2008: L leg ischemia >> embolectomy of left fem, pop, tib arteries ( Dr. Scot Dock ) -known occlusion of right pop with collats - med Rx - R renal infarct Polysubstance abuse Smoker    PLAN: COVID-19 [place] is 89, had 2 previous COVID vaccines, developed GI symptoms yesterday along with chills and mild cough.  COVID test came back positive today. No recent renal function.  Recommend medication to decrease his chances to get worse. Plan: molnupiravir Rest, fluids, Imodium. Also Tylenol , Robitussin for cough prn. Recommend isolation for 10 days, work note sent. Recommend to get a booster in 4 weeks. If symptoms are not gradually better let me know, if severe symptoms including worsening chest pain, increasing diarrhea, decreased p.o. intake, difficulty breathing etc.: Go to the ER. He verbalized understanding of above and denies any further questions. Additionally, I have not seen him in a while, recommend routine visit at his earliest convenience.      I discussed the assessment and treatment plan with the patient. The patient was provided an opportunity to ask questions and all were answered. The patient agreed with the plan and demonstrated an understanding of the instructions.   The patient was advised to call back or seek an in-person evaluation if the symptoms worsen or if the condition fails to improve as anticipated.

## 2021-03-31 NOTE — Assessment & Plan Note (Signed)
COVID-19 [name] is 83, had 2 previous COVID vaccines, developed GI symptoms yesterday along with chills and mild cough.  COVID test came back positive today. No recent renal function.  Recommend medication to decrease his chances to get worse. Plan: molnupiravir Rest, fluids, Imodium. Also Tylenol , Robitussin for cough prn. Recommend isolation for 10 days, work note sent. Recommend to get a booster in 4 weeks. If symptoms are not gradually better let me know, if severe symptoms including worsening chest pain, increasing diarrhea, decreased p.o. intake, difficulty breathing etc.: Go to the ER. He verbalized understanding of above and denies any further questions. Additionally, I have not seen him in a while, recommend routine visit at his earliest convenience.

## 2021-04-13 NOTE — Progress Notes (Signed)
Cardiology Office Note    Date:  04/27/2021   ID:  Carbon, Treadway 09/22/1960, MRN [id number]   PCP:  Colon Branch, Irena  Cardiologist:  Sherren Mocha, MD   Advanced Practice Provider:  No care team member to display Electrophysiologist:  None   [id number]   Chief Complaint  Patient presents with   Follow-up     History of Present Illness:  [name] is a 60 y.o. male with a hx of peripheral arterial disease, maintained on Eliquis.  The patient has a history of hypercoagulable disorder with left femoral and popliteal artery occlusion in 2008 requiring embolectomy.  He has a history of renal infarction as well.  Cardiac catheterization in 2012 for evaluation of chronic chest pain demonstrated widely patent coronary arteries with no angiographic evidence of disease.   The patient has a history of polysubstance use.  Patient last saw Dr. Burt Knack 12/20/19 and complained of SOB. He continued to smoke cigarettes and drink excessive alcohol.  Reports occasional marijuana use. Echo ordered but never done.  Called in 03/05/21 and couldn't afford eliquis and ran out. Was taking his daughters.  Had [place] 03/31/21.  Patient comes in for f/u. Says he cramps a lot because he stays dehydrated. Drinks a 6 pack a day and a pint of liquor daily. Smokes 1/2 ppd and marijuana. Says his wife drinks with him and he's not willing to quit. 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 Gets sore in his chest when he twists and turns. Has lost about 20 lbs. Back on Eliquis. Misses his evening dose twice a week because he forgets. Will help him set an alarm. No shortness of breath.     Past Medical History:  Diagnosis Date   Chest pain    stres test neg x 2, cath 5/12; minimal LAD irregs; no obs CAD; normal LVF   Clotting disorder (HCC)    dvt   GERD (gastroesophageal reflux disease)    History of DVT of lower extremity    on chronic coumadin    Hypertension    Internal hemorrhoids    Cscope 2007   PAD (peripheral artery disease) ([place])    a. left leg ischemia tx wtih embolectomy of left fem, pop, tib arteries with Dr. Scot Dock - 08/2006;  b. known occlusion of right pop with collats - med Rx;  c.ABI's 8/09: R 1.0; L 0.91    PFO (patent foramen ovale)    per 08/2006 discharge summary, TEE showed EF 60%, small PFO with minimal right-to-left shunt with Valsalva; not felt to be the source of emboli, lifelong Coumadin recommended   Pneumonia    history of   Polysubstance abuse ([place])    marijuana only   Renal infarct ([place])    R    Past Surgical History:  Procedure Laterality Date   CHOLECYSTECTOMY     COLONOSCOPY     INGUINAL HERNIA REPAIR Left 07/16/2019   Procedure: LAPAROSCOPIC LEFT INGUINAL HERNIA REPAIR WITH MESH;  Surgeon: Ralene Ok, MD;  Location: [facility];  Service: General;  Laterality: Left;   left leg blood clot removal 2009  2009   TONSILLECTOMY     WRIST SURGERY      Current Medications: Current Meds  Medication Sig   apixaban (ELIQUIS) 5 MG TABS tablet Take 1 tablet (5 mg total) by mouth 2 (two) times daily.   lisinopril (ZESTRIL) 10 MG tablet TAKE  1 TABLET BY MOUTH ONCE DAILY . APPOINTMENT REQUIRED FOR FUTURE REFILLS     Allergies:   Pravastatin, Simvastatin, and Lisinopril   Social History   Socioeconomic History   Marital status: Married    Spouse name: Not on file   Number of children: 2   Years of education: Not on file   Highest education level: Not on file  Occupational History   Occupation: [employer], driver   Tobacco Use   Smoking status: Every Day    Packs/day: 1.00    Years: 30.00    Pack years: 30.00    Types: Cigarettes   Smokeless tobacco: Never   Tobacco comments:    1 to 1.5 ppd   Vaping Use   Vaping Use: Never used  Substance and Sexual Activity   Alcohol use: Yes    Comment: ETOH most days ," 2- 40 oz beers , and a pint of liquor   Drug use: Yes    Types: Marijuana     Comment: 07/09/19   Sexual activity: Yes    Partners: Female  Other Topics Concern   Not on file  Social History Narrative   1 child lives w/ them   Social Determinants of Health   Financial Resource Strain: Not on file  Food Insecurity: Not on file  Transportation Needs: Not on file  Physical Activity: Not on file  Stress: Not on file  Social Connections: Not on file     Family History:  The patient's  family history includes Breast cancer in his mother; CAD in his father; Diabetes in his father; Heart disease in his paternal grandmother; Hypertension in his father and mother; Lung cancer in his maternal uncle; Pancreatic cancer in his brother; Prostate cancer in his maternal uncle.   ROS:   Please see the history of present illness.    ROS All other systems reviewed and are negative.   PHYSICAL EXAM:   VS:  BP 132/88   Pulse 83   Ht '6\' 1"'$  (1.854 m)   Wt 180 lb 3.2 oz (81.7 kg)   SpO2 96%   BMI 23.77 kg/m   Physical Exam  GEN: Thin, in no acute distress  Neck: no JVD, carotid bruits, or masses Cardiac:RRR; no murmurs, rubs, or gallops  Respiratory:  clear to auscultation bilaterally, normal work of breathing GI: soft, nontender, nondistended, + BS Ext: without cyanosis, clubbing, or edema, Good distal pulses bilaterally Neuro:  Alert and Oriented x 3 Psych: euthymic mood, full affect  Wt Readings from Last 3 Encounters:  04/27/21 180 lb 3.2 oz (81.7 kg)  03/30/21 190 lb (86.2 kg)  02/05/21 180 lb (81.6 kg)      Studies/Labs Reviewed:   EKG:  EKG is  ordered today.  The ekg ordered today demonstrates NSR, normal EKG  Recent Labs: No results found for requested labs within last 8760 hours.   Lipid Panel    Component Value Date/Time   CHOL 241 (H) 07/25/2016 1539   TRIG 179.0 (H) 07/25/2016 1539   HDL 51.20 07/25/2016 1539   CHOLHDL 5 07/25/2016 1539   VLDL 35.8 07/25/2016 1539   LDLCALC 154 (H) 07/25/2016 1539   LDLDIRECT 143.7 11/24/2008 1235     Additional studies/ records that were reviewed today include:      Risk Assessment/Calculations:         ASSESSMENT:    1. Medication management   2. PAD (peripheral artery disease) ([place])   3. Shortness of breath   4.  History of chest pain   5. Polysubstance abuse ([place])      PLAN:  In order of problems listed above:  PAD maintained on Eliquis.  The patient has a history of hypercoagulable disorder with left femoral and popliteal artery occlusion in 2008 requiring embolectomy.  He has a history of renal infarction as well. Continues to miss eliquis doses regularly. We set an alarm on his phone to help remind him.  SOB last year has resolved. echo ordered and never done. With no symptoms will hold off for now.  History of chest pain with widely patent coronary arteries cath 2012-M-S chest pain when he moves from heavy lifting at work.   Polysubstance abuse-Continues to drink alcohol excessively, smoke marijuana, cigarettes. Long discussion with patient about high risk behavior. He's not willing to cut back at this time.  Shared Decision Making/Informed Consent        Medication Adjustments/Labs and Tests Ordered: Current medicines are reviewed at length with the patient today.  Concerns regarding medicines are outlined above.  Medication changes, Labs and Tests ordered today are listed in the Patient Instructions below. Patient Instructions  Medication Instructions:  Your physician recommends that you continue on your current medications as directed. Please refer to the Current Medication list given to you today.  *If you need a refill on your cardiac medications before your next appointment, please call your pharmacy*   Lab Work: Cbc, Cmp, Lipids- Today   If you have labs (blood work) drawn today and your tests are completely normal, you will receive your results only by: [place] (if you have MyChart) OR A paper copy in the mail If you have any lab test  that is abnormal or we need to change your treatment, we will call you to review the results.   Testing/Procedures: None ordered    Follow-Up: At [facility], you and your health needs are our priority.  As part of our continuing mission to provide you with exceptional heart care, we have created designated Provider Care Teams.  These Care Teams include your primary Cardiologist (physician) and Advanced Practice Providers (APPs -  Physician Assistants and Nurse Practitioners) who all work together to provide you with the care you need, when you need it.  We recommend signing up for the patient portal called "MyChart".  Sign up information is provided on this After Visit Summary.  MyChart is used to connect with patients for Virtual Visits (Telemedicine).  Patients are able to view lab/test results, encounter notes, upcoming appointments, etc.  Non-urgent messages can be sent to your provider as well.   To learn more about what you can do with MyChart, go to [email or web address].    Your next appointment:   12 month(s)  The format for your next appointment:   In Person  Provider:   You may see Sherren Mocha, MD or one of the following Advanced Practice Providers on your designated Care Team:   Richardson Dopp, PA-C Robbie Lis, [place]   Other Instructions Your physician recommends that you cut back on smoking and drinking alcohol      Signed, Ermalinda Barrios, PA-C  04/27/2021 8:19 AM    [address], [place], [place]  [address] Phone: [phone]; Fax: [phone]

## 2021-04-16 ENCOUNTER — Other Ambulatory Visit: Payer: Self-pay | Admitting: Pharmacist

## 2021-04-16 MED ORDER — APIXABAN 5 MG PO TABS: 5.0000 mg | ORAL_TABLET | Freq: Two times a day (BID) | ORAL | 5 refills | Status: DC

## 2021-04-16 NOTE — Telephone Encounter (Signed)
I spoke with patient who states that his Eliquis was $60 when he got it in August. He already had a copay card that was activated in Aug of last year when he switched to Eliquis. He states he got new insurance. I called the pharmacy who states that they are only processing the coupon, they didn't have insurance on file. When I told patient this, he states that he thought he gave them his insurance information. I do not have the copay card info or the insurance to confirm this. I advised patient give his insurance information to the pharmacy. He was at work and couldn't discuss more with me.

## 2021-04-19 NOTE — Telephone Encounter (Signed)
I spoke to patient pharmacy again bc since what the first person told me on Friday did not make sense. He did have both insurance and co-pay card on file. They billed both and its now $10. Patient made aware and is thankful for our help.

## 2021-04-22 ENCOUNTER — Other Ambulatory Visit: Payer: Self-pay | Admitting: Cardiovascular Disease

## 2021-04-22 DIAGNOSIS — I739 Peripheral vascular disease, unspecified: Secondary | ICD-10-CM

## 2021-04-22 DIAGNOSIS — I10 Essential (primary) hypertension: Secondary | ICD-10-CM

## 2021-04-27 ENCOUNTER — Other Ambulatory Visit: Payer: Self-pay

## 2021-04-27 ENCOUNTER — Ambulatory Visit (INDEPENDENT_AMBULATORY_CARE_PROVIDER_SITE_OTHER): Payer: BC Managed Care – PPO | Admitting: Physician Assistant

## 2021-04-27 ENCOUNTER — Encounter: Payer: Self-pay | Admitting: Physician Assistant

## 2021-04-27 VITALS — BP 132/88 | HR 83 | Ht 73.0 in | Wt 180.2 lb

## 2021-04-27 DIAGNOSIS — I739 Peripheral vascular disease, unspecified: Secondary | ICD-10-CM | POA: Diagnosis not present

## 2021-04-27 DIAGNOSIS — F1910 Other psychoactive substance abuse, uncomplicated: Secondary | ICD-10-CM

## 2021-04-27 DIAGNOSIS — Z87898 Personal history of other specified conditions: Secondary | ICD-10-CM

## 2021-04-27 DIAGNOSIS — R0602 Shortness of breath: Secondary | ICD-10-CM | POA: Diagnosis not present

## 2021-04-27 DIAGNOSIS — Z79899 Other long term (current) drug therapy: Secondary | ICD-10-CM

## 2021-04-27 LAB — LIPID PANEL
Chol/HDL Ratio: 2.9 ratio (ref 0.0–5.0)
Cholesterol, Total: 265 mg/dL — ABNORMAL HIGH (ref 100–199)
HDL: 91 mg/dL (ref >39)
LDL Chol Calc (NIH): 158 mg/dL — ABNORMAL HIGH (ref 0–99)
Triglycerides: 97 mg/dL (ref 0–149)
VLDL Cholesterol Cal: 16 mg/dL (ref 5–40)

## 2021-04-27 LAB — COMPREHENSIVE METABOLIC PANEL
ALT: 21 IU/L (ref 0–44)
AST: 38 IU/L (ref 0–40)
Albumin/Globulin Ratio: 2.5 — ABNORMAL HIGH (ref 1.2–2.2)
Albumin: 4.8 g/dL (ref 3.8–4.9)
Alkaline Phosphatase: 87 IU/L (ref 44–121)
BUN/Creatinine Ratio: 14 (ref 9–20)
BUN: 13 mg/dL (ref 6–24)
Bilirubin Total: 0.6 mg/dL (ref 0.0–1.2)
CO2: 20 mmol/L (ref 20–29)
Calcium: 9.9 mg/dL (ref 8.7–10.2)
Chloride: 105 mmol/L (ref 96–106)
Creatinine, Ser: 0.9 mg/dL (ref 0.76–1.27)
Globulin, Total: 1.9 g/dL (ref 1.5–4.5)
Glucose: 85 mg/dL (ref 65–99)
Potassium: 3.7 mmol/L (ref 3.5–5.2)
Sodium: 143 mmol/L (ref 134–144)
Total Protein: 6.7 g/dL (ref 6.0–8.5)
eGFR: 99 mL/min/{1.73_m2} (ref >59)

## 2021-04-27 LAB — CBC
Hematocrit: 43.5 % (ref 37.5–51.0)
Hemoglobin: 14.5 g/dL (ref 13.0–17.7)
MCH: 32 pg (ref 26.6–33.0)
MCHC: 33.3 g/dL (ref 31.5–35.7)
MCV: 96 fL (ref 79–97)
Platelets: 225 10*3/uL (ref 150–450)
RBC: 4.53 x10E6/uL (ref 4.14–5.80)
RDW: 13.7 % (ref 11.6–15.4)
WBC: 6.3 10*3/uL (ref 3.4–10.8)

## 2021-04-27 NOTE — Patient Instructions (Signed)
Medication Instructions:  Your physician recommends that you continue on your current medications as directed. Please refer to the Current Medication list given to you today.  *If you need a refill on your cardiac medications before your next appointment, please call your pharmacy*   Lab Work: Cbc, Cmp, Lipids- Today   If you have labs (blood work) drawn today and your tests are completely normal, you will receive your results only by: [place] (if you have MyChart) OR A paper copy in the mail If you have any lab test that is abnormal or we need to change your treatment, we will call you to review the results.   Testing/Procedures: None ordered    Follow-Up: At [facility], you and your health needs are our priority.  As part of our continuing mission to provide you with exceptional heart care, we have created designated Provider Care Teams.  These Care Teams include your primary Cardiologist (physician) and Advanced Practice Providers (APPs -  Physician Assistants and Nurse Practitioners) who all work together to provide you with the care you need, when you need it.  We recommend signing up for the patient portal called "MyChart".  Sign up information is provided on this After Visit Summary.  MyChart is used to connect with patients for Virtual Visits (Telemedicine).  Patients are able to view lab/test results, encounter notes, upcoming appointments, etc.  Non-urgent messages can be sent to your provider as well.   To learn more about what you can do with MyChart, go to [email or web address].    Your next appointment:   12 month(s)  The format for your next appointment:   In Person  Provider:   You may see Sherren Mocha, MD or one of the following Advanced Practice Providers on your designated Care Team:   Richardson Dopp, PA-C Robbie Lis, [place]   Other Instructions Your physician recommends that you cut back on smoking and drinking alcohol

## 2021-04-28 ENCOUNTER — Other Ambulatory Visit: Payer: Self-pay

## 2021-04-28 DIAGNOSIS — I739 Peripheral vascular disease, unspecified: Secondary | ICD-10-CM

## 2021-04-28 DIAGNOSIS — E785 Hyperlipidemia, unspecified: Secondary | ICD-10-CM

## 2021-04-28 MED ORDER — ATORVASTATIN CALCIUM 40 MG PO TABS: 40.0000 mg | ORAL_TABLET | Freq: Every day | ORAL | 3 refills | Status: DC

## 2021-05-11 ENCOUNTER — Other Ambulatory Visit: Payer: Self-pay | Admitting: Cardiovascular Disease

## 2021-05-11 DIAGNOSIS — I10 Essential (primary) hypertension: Secondary | ICD-10-CM

## 2021-05-11 DIAGNOSIS — I739 Peripheral vascular disease, unspecified: Secondary | ICD-10-CM

## 2021-07-30 ENCOUNTER — Other Ambulatory Visit: Payer: BC Managed Care – PPO | Admitting: *Deleted

## 2021-07-30 ENCOUNTER — Other Ambulatory Visit: Payer: Self-pay

## 2021-07-30 DIAGNOSIS — I739 Peripheral vascular disease, unspecified: Secondary | ICD-10-CM

## 2021-07-30 DIAGNOSIS — E785 Hyperlipidemia, unspecified: Secondary | ICD-10-CM

## 2021-07-31 LAB — HEPATIC FUNCTION PANEL
ALT: 12 IU/L (ref 0–44)
AST: 20 IU/L (ref 0–40)
Albumin: 4.6 g/dL (ref 3.8–4.9)
Alkaline Phosphatase: 86 IU/L (ref 44–121)
Bilirubin Total: 0.6 mg/dL (ref 0.0–1.2)
Bilirubin, Direct: 0.18 mg/dL (ref 0.00–0.40)
Total Protein: 6.6 g/dL (ref 6.0–8.5)

## 2021-07-31 LAB — LIPID PANEL
Chol/HDL Ratio: 2.4 ratio (ref 0.0–5.0)
Cholesterol, Total: 162 mg/dL (ref 100–199)
HDL: 68 mg/dL (ref >39)
LDL Chol Calc (NIH): 68 mg/dL (ref 0–99)
Triglycerides: 155 mg/dL — ABNORMAL HIGH (ref 0–149)
VLDL Cholesterol Cal: 26 mg/dL (ref 5–40)

## 2021-08-03 ENCOUNTER — Other Ambulatory Visit: Payer: Self-pay

## 2021-08-03 MED ORDER — ATORVASTATIN CALCIUM 40 MG PO TABS: 40.0000 mg | ORAL_TABLET | Freq: Every day | ORAL | 3 refills | Status: DC

## 2021-10-08 ENCOUNTER — Encounter: Payer: Self-pay | Admitting: Internal Medicine

## 2021-10-23 ENCOUNTER — Other Ambulatory Visit: Payer: Self-pay | Admitting: Cardiovascular Disease

## 2021-10-25 NOTE — Telephone Encounter (Signed)
Pt last saw Ermalinda Barrios, PA on 04/27/21, last labs 04/27/21 Creat 0.90, age 61, weight 81.7kg, based on specified criteria pt is on appropriate dosage of Eliquis '5mg'$  BID for PAD.  Will refill rx.  ?

## 2021-11-23 ENCOUNTER — Telehealth: Payer: Self-pay | Admitting: *Deleted

## 2021-11-23 NOTE — Telephone Encounter (Signed)
Patient has appt for tomorrow. ?

## 2021-11-23 NOTE — Telephone Encounter (Signed)
Who Is Calling Patient / Member / Family / Caregiver ?Call Type Triage / Clinical ?Relationship To Patient Self ?Return Phone Number [phone] (Primary) ?Chief Complaint Blood In Stool ?Reason for Call Symptomatic / Request for Health Information ?Initial Comment Caller states this morning he had a lot of blood in ?his stool. ?Translation No ?Nurse Assessment ?Nurse: Rolin Barry RN, Levada Dy Date/Time Eilene Ghazi Time): 11/23/2021 4:37:29 PM ?Confirm and document reason for call. If ?symptomatic, describe symptoms. ?---Caller states this morning he had a lot of blood in his ?stool. Caller advised that his right leg is also bothering ?him. Lost weight. ?Does the patient have any new or worsening ?symptoms? ---Yes ?Will a triage be completed? ---Yes ?Related visit to physician within the last 2 weeks? ---No ?Does the PT have any chronic conditions? (i.e. ?diabetes, asthma, this includes High risk factors for ?pregnancy, etc.) ?---Yes ?List chronic conditions. ---Caller is on Eliquis for a DVT , 6 years ago. ?Is this a behavioral health or substance abuse call? ---No ?Guidelines ?Guideline Title Affirmed Question Affirmed Notes Nurse Date/Time (Eastern ?Time) ?Rectal Bleeding Taking Coumadin ?(warfarin) or other ?strong blood thinner, ?or known bleeding ?disorder (e.g., ?thrombocytopenia) ?Deaton, RN, Levada Dy 11/23/2021 4:39:06 ?PM ?Disp. Time (Eastern ?Time) Disposition Final User ?11/23/2021 4:44:25 PM Go to ED Now (or PCP triage) Yes Deaton, RN, Levada Dy ? ?Comments ?User: Saverio Danker, RN Date/Time Eilene Ghazi Time): 11/23/2021 4:46:46 PM ?Caller refused to go in to be seen. Wife advised that she was not going into be seen tonight all they will do is tell ?them to go see his MD. Osvaldo Human was connected with the office and has a 9am appt. ?User: Saverio Danker, RN Date/Time Eilene Ghazi Time): 11/23/2021 4:47:40 PM ?Spoke with Lyla at the office. ?Referrals ?Warm transfer to backline ?

## 2021-11-24 ENCOUNTER — Ambulatory Visit (INDEPENDENT_AMBULATORY_CARE_PROVIDER_SITE_OTHER): Payer: BC Managed Care – PPO | Admitting: Family Medicine

## 2021-11-24 ENCOUNTER — Encounter: Payer: Self-pay | Admitting: Family Medicine

## 2021-11-24 VITALS — BP 110/86 | HR 90 | Ht 72.0 in | Wt 174.8 lb

## 2021-11-24 DIAGNOSIS — M25551 Pain in right hip: Secondary | ICD-10-CM | POA: Diagnosis not present

## 2021-11-24 DIAGNOSIS — K921 Melena: Secondary | ICD-10-CM

## 2021-11-24 DIAGNOSIS — R5383 Other fatigue: Secondary | ICD-10-CM | POA: Diagnosis not present

## 2021-11-24 LAB — COMPREHENSIVE METABOLIC PANEL
ALT: 28 U/L (ref 0–53)
AST: 45 U/L — ABNORMAL HIGH (ref 0–37)
Albumin: 4.2 g/dL (ref 3.5–5.2)
Alkaline Phosphatase: 75 U/L (ref 39–117)
BUN: 16 mg/dL (ref 6–23)
CO2: 26 mEq/L (ref 19–32)
Calcium: 9.3 mg/dL (ref 8.4–10.5)
Chloride: 104 mEq/L (ref 96–112)
Creatinine, Ser: 0.86 mg/dL (ref 0.40–1.50)
GFR: 95.27 mL/min (ref >60.00)
Glucose, Bld: 69 mg/dL — ABNORMAL LOW (ref 70–99)
Potassium: 3.4 mEq/L — ABNORMAL LOW (ref 3.5–5.1)
Sodium: 141 mEq/L (ref 135–145)
Total Bilirubin: 1.1 mg/dL (ref 0.2–1.2)
Total Protein: 6.4 g/dL (ref 6.0–8.3)

## 2021-11-24 LAB — CBC WITH DIFFERENTIAL/PLATELET
Basophils Absolute: 0 10*3/uL (ref 0.0–0.1)
Basophils Relative: 0.4 % (ref 0.0–3.0)
Eosinophils Absolute: 0.1 10*3/uL (ref 0.0–0.7)
Eosinophils Relative: 1.1 % (ref 0.0–5.0)
HCT: 39.9 % (ref 39.0–52.0)
Hemoglobin: 13.3 g/dL (ref 13.0–17.0)
Lymphocytes Relative: 24.7 % (ref 12.0–46.0)
Lymphs Abs: 1.4 10*3/uL (ref 0.7–4.0)
MCHC: 33.4 g/dL (ref 30.0–36.0)
MCV: 97.6 fl (ref 78.0–100.0)
Monocytes Absolute: 0.3 10*3/uL (ref 0.1–1.0)
Monocytes Relative: 5.8 % (ref 3.0–12.0)
Neutro Abs: 4 10*3/uL (ref 1.4–7.7)
Neutrophils Relative %: 68 % (ref 43.0–77.0)
Platelets: 195 10*3/uL (ref 150.0–400.0)
RBC: 4.09 Mil/uL — ABNORMAL LOW (ref 4.22–5.81)
RDW: 14.1 % (ref 11.5–15.5)
WBC: 5.9 10*3/uL (ref 4.0–10.5)

## 2021-11-24 LAB — TSH: TSH: 0.88 u[IU]/mL (ref 0.35–5.50)

## 2021-11-24 LAB — PROTIME-INR
INR: 1.1 ratio — ABNORMAL HIGH (ref 0.8–1.0)
Prothrombin Time: 11.9 s (ref 9.6–13.1)

## 2021-11-24 NOTE — Progress Notes (Signed)
? ?Acute Office Visit ? ?Subjective:  ? ?  ?Patient ID: [name], male    DOB: 05/02/1961, 61 y.o.   MRN: [id number] ? ?CC: blood in stool yesterday  ? ? ?HPI ?Patient is in today for blood in stool yesterday.  ? ?Patient reports that yesterday morning he had a BM and noticed a decent amount of bright blood in the toilet - enough to tint the water red. States he had another BM yesterday evening that was normal and has not noticed any blood today. He is on eliquis for history of DVTs, but reports he often forgets to take the evening dose. Wife is concerned about the amount of alcohol he drinks - at least a pint a day plus one beer. Reports that for the past 6 months he has seemed more tired/fatigued (wanting to go to bed around 7pm instead of being active like usual), and has lost about 6 pounds without trying. Also reports that he always feels cold. He denies any cardiac chest pain, dyspnea, n/v/d, constipation, hemorrhoids, rectal pain/fullness/itching, fevers, cough, indigestion. ? ?Additionally he has been having some daily lateral hip pain after standing all day at work. Reports sometimes it is a burning sensation, but does not radiate. No back pain, numbness, tingling, weakness. He would like some home exercises but declines any further workup at this time. Aware that he cannot take NSAIDs. ? ? ? ?ROS ?All review of systems negative except what is listed in the HPI ? ? ?   ?Objective:  ?  ?BP 110/86   Pulse 90   Ht 6' (1.829 m)   Wt 174 lb 12.8 oz (79.3 kg)   SpO2 99%   BMI 23.71 kg/m?  ? ? ?Physical Exam ?Vitals reviewed.  ?Constitutional:   ?   General: He is not in acute distress. ?   Appearance: Normal appearance. He is normal weight. He is not ill-appearing.  ?Cardiovascular:  ?   Rate and Rhythm: Normal rate and regular rhythm.  ?Pulmonary:  ?   Effort: Pulmonary effort is normal.  ?   Breath sounds: Normal breath sounds.  ?Abdominal:  ?   General: Abdomen is flat. Bowel sounds are normal.  There is no distension.  ?   Palpations: Abdomen is soft. There is no mass.  ?   Tenderness: There is no abdominal tenderness. There is no guarding or rebound.  ?Genitourinary: ?   Rectum: Guaiac result negative.  ?Musculoskeletal:     ?   General: No swelling. Normal range of motion.  ?   Right lower leg: No edema.  ?   Left lower leg: No edema.  ?   Comments: Pain to palpation of right lateral hip, trochanteric bursa region  ?Skin: ?   General: Skin is warm and dry.  ?Neurological:  ?   General: No focal deficit present.  ?   Mental Status: He is alert and oriented to person, place, and time. Mental status is at baseline.  ?Psychiatric:     ?   Mood and Affect: Mood normal.     ?   Behavior: Behavior normal.     ?   Thought Content: Thought content normal.     ?   Judgment: Judgment normal.  ? ? ?No results found for any visits on 11/24/21. ? ? ?   ?Assessment & Plan:  ? ?1. Fatigue, unspecified type ?Lab work today. Cut back on alcohol. Focus on healthy diet and regular exercise.  ?- CBC with  Differential/Platelet ?- Comprehensive metabolic panel ?- TSH ? ?2. Blood in stool ?Occult stool negative in office today ?Will check blood work and let you know when results are in.  ?Continue monitoring stool - if you see more blood let us know or contact your GI doctor (if you need a new referral we can place one). If severe amount or other symptoms, go to the ED.  ?Cut back on daily alcohol use.  ?- CBC with Differential/Platelet ?- Protime-INR ? ?3. Right hip pain ?Consider trochanteric bursitis vs arthritis. He declines further eval at this time. Agreeable to home exercises, heat/ice, massage. Avoid NSAIDs. Follow-up if not improving and we can get imaging and refer to sports medicine.  ? ?Patient aware of signs/symptoms requiring further/urgent evaluation.  ? ?Return for pending labs or as needed . ? ?Terrilyn Saver, NP ? ? ?

## 2021-11-24 NOTE — Patient Instructions (Signed)
Occult stool negative in office today ?Will check blood work and let you know when results are in.  ?Continue monitoring stool - if you see more blood let us know or contact your GI doctor (if you need a new referral we can place one). If severe amount or other symptoms, go to the ED.  ?Cut back on daily alcohol use.  ?

## 2021-11-24 NOTE — Progress Notes (Signed)
Right hip pain going down into right leg ? ?

## 2021-11-25 LAB — HEMOCCULT GUIAC POC 1CARD (OFFICE): Fecal Occult Blood, POC: NEGATIVE

## 2021-11-25 NOTE — Addendum Note (Signed)
Addended by: Jamesetta So on: 11/25/2021 08:39 AM ? ? Modules accepted: Orders ? ?

## 2022-02-07 ENCOUNTER — Ambulatory Visit: Payer: BC Managed Care – PPO | Admitting: Internal Medicine

## 2022-04-13 ENCOUNTER — Telehealth: Payer: Self-pay | Admitting: Cardiovascular Disease

## 2022-04-13 NOTE — Telephone Encounter (Signed)
  Pt c/o medication issue:  1. Name of Medication: apixaban (ELIQUIS) 5 MG TABS tablet  2. How are you currently taking this medication (dosage and times per day)? Take 1 tablet by mouth twice daily  3. Are you having a reaction (difficulty breathing--STAT)? no  4. What is your medication issue? Pt said, he got a refill for his eliquis and pharmacy charging him $60 for 30 days. He said, he needs the copay card again, he was given last year. For the mean time he only has 1 pill left and asking if he can get samples as well

## 2022-04-15 NOTE — Telephone Encounter (Signed)
[name] to see if current coupon code shows expired or if his copay had increased. Per pharmacist, she tried to run the coupon card and it rejects stating: "member not on file, non-matched cardholder ID." Went on Eliquis site to try and get him a new card, but it rejects his information and states "Duplicate records are found. Please call [phone] to speak with a live specialist." Called patient to let him know that we do not give samples unless it's a new start or if pending patient assistance. He took down number and states he will call them.

## 2022-05-10 ENCOUNTER — Ambulatory Visit: Payer: BC Managed Care – PPO | Attending: Cardiovascular Disease | Admitting: Cardiovascular Disease

## 2022-05-10 ENCOUNTER — Encounter: Payer: Self-pay | Admitting: Cardiovascular Disease

## 2022-05-10 VITALS — BP 110/80 | HR 95 | Ht 72.0 in | Wt 176.0 lb

## 2022-05-10 DIAGNOSIS — R079 Chest pain, unspecified: Secondary | ICD-10-CM

## 2022-05-10 DIAGNOSIS — E782 Mixed hyperlipidemia: Secondary | ICD-10-CM

## 2022-05-10 DIAGNOSIS — I10 Essential (primary) hypertension: Secondary | ICD-10-CM

## 2022-05-10 NOTE — Progress Notes (Signed)
Cardiology Office Note:    Date:  05/16/2022   ID:  CLIFORD Anthony, DOB 1961/01/24, MRN [id number]  PCP:  Colon Branch, MD   [employer] Providers Cardiologist:  Sherren Mocha, MD     Referring MD: Colon Branch, MD   Chief Complaint  Patient presents with   Annual Exam   PAD    History of Present Illness:    [name] is a 61 y.o. male presenting for follow-up of peripheral arterial disease and hypercoagulable disorder.  He has a history of left femoral and popliteal artery occlusions in 2008 requiring embolectomy.  He also experienced a renal infarction at that time.  He underwent cardiac catheterization in 2012 demonstrating no evidence of coronary artery disease.  The patient is here alone today.  He was last seen in September 2022 with an APP visit with Amie Portland.  The patient has had episodic chest discomfort that occurs over on the left side of the chest and is not consistently related to physical exertion.  The pain is a dull ache and not relieved by any particular movement nor intervention.  He continues to drink alcohol and smoke cigarettes.  He also smokes marijuana.  He does not use any other illicit drugs.  He has exertional dyspnea without any recent change.  No orthopnea, PND, or heart palpitations.  He does have some problems with swelling in his left leg.  Past Medical History:  Diagnosis Date   Chest pain    stres test neg x 2, cath 5/12; minimal LAD irregs; no obs CAD; normal LVF   Clotting disorder (HCC)    dvt   GERD (gastroesophageal reflux disease)    History of DVT of lower extremity    on chronic coumadin   Hypertension    Internal hemorrhoids    Cscope 2007   PAD (peripheral artery disease) ([place])    a. left leg ischemia tx wtih embolectomy of left fem, pop, tib arteries with Dr. Scot Dock - 08/2006;  b. known occlusion of right pop with collats - med Rx;  c.ABI's 8/09: R 1.0; L 0.91    PFO (patent foramen ovale)    per 08/2006  discharge summary, TEE showed EF 60%, small PFO with minimal right-to-left shunt with Valsalva; not felt to be the source of emboli, lifelong Coumadin recommended   Pneumonia    history of   Polysubstance abuse ([place])    marijuana only   Renal infarct ([place])    R    Past Surgical History:  Procedure Laterality Date   CHOLECYSTECTOMY     COLONOSCOPY     INGUINAL HERNIA REPAIR Left 07/16/2019   Procedure: LAPAROSCOPIC LEFT INGUINAL HERNIA REPAIR WITH MESH;  Surgeon: Ralene Ok, MD;  Location: [facility];  Service: General;  Laterality: Left;   left leg blood clot removal 2009  2009   TONSILLECTOMY     WRIST SURGERY      Current Medications: Current Meds  Medication Sig   apixaban (ELIQUIS) 5 MG TABS tablet Take 1 tablet by mouth twice daily   esomeprazole (NEXIUM) 20 MG packet Take 20 mg by mouth daily before breakfast.   fexofenadine (ALLEGRA) 180 MG tablet Take 180 mg by mouth daily.   HYDROcodone-acetaminophen (NORCO) 7.5-325 MG tablet Take 1 tablet by mouth every 6 (six) hours as needed for pain.   lisinopril (ZESTRIL) 10 MG tablet Take 1 tablet (10 mg total) by mouth daily.   methylPREDNISolone (MEDROL) 4 MG tablet Take  4 mg by mouth daily.   Multiple Vitamin (MULTIVITAMIN) tablet Take 1 tablet by mouth daily.     Allergies:   Pravastatin, Simvastatin, and Lisinopril   Social History   Socioeconomic History   Marital status: Married    Spouse name: Not on file   Number of children: 2   Years of education: Not on file   Highest education level: Not on file  Occupational History   Occupation: [employer], driver   Tobacco Use   Smoking status: Every Day    Packs/day: 1.00    Years: 30.00    Total pack years: 30.00    Types: Cigarettes   Smokeless tobacco: Never   Tobacco comments:    1 to 1.5 ppd   Vaping Use   Vaping Use: Never used  Substance and Sexual Activity   Alcohol use: Yes    Comment: ETOH most days ," 2- 40 oz beers , and a pint of liquor   Drug use:  Yes    Types: Marijuana    Comment: 07/09/19   Sexual activity: Yes    Partners: Female  Other Topics Concern   Not on file  Social History Narrative   1 child lives w/ them   Social Determinants of Health   Financial Resource Strain: Not on file  Food Insecurity: Not on file  Transportation Needs: Not on file  Physical Activity: Not on file  Stress: Not on file  Social Connections: Not on file     Family History: The patient's family history includes Breast cancer in his mother; CAD in his father; Diabetes in his father; Heart disease in his paternal grandmother; Hypertension in his father and mother; Lung cancer in his maternal uncle; Pancreatic cancer in his brother; Prostate cancer in his maternal uncle. There is no history of Colon cancer, Stomach cancer, Esophageal cancer, or Rectal cancer.  ROS:   Please see the history of present illness.    All other systems reviewed and are negative.  EKGs/Labs/Other Studies Reviewed:     EKG:  EKG is ordered today.  The ekg ordered today demonstrates normal sinus rhythm 95 bpm, within normal limits  Recent Labs: 11/24/2021: ALT 28; BUN 16; Creatinine, Ser 0.86; Hemoglobin 13.3; Platelets 195.0; Potassium 3.4; Sodium 141; TSH 0.88  Recent Lipid Panel    Component Value Date/Time   CHOL 162 07/30/2021 1517   TRIG 155 (H) 07/30/2021 1517   HDL 68 07/30/2021 1517   CHOLHDL 2.4 07/30/2021 1517   CHOLHDL 5 07/25/2016 1539   VLDL 35.8 07/25/2016 1539   LDLCALC 68 07/30/2021 1517   LDLDIRECT 143.7 11/24/2008 1235     Risk Assessment/Calculations:           Physical Exam:    VS:  BP 110/80 (BP Location: Left Arm, Patient Position: Sitting, Cuff Size: Normal)   Pulse 95   Ht 6' (1.829 m)   Wt 176 lb (79.8 kg)   SpO2 97%   BMI 23.87 kg/m     Wt Readings from Last 3 Encounters:  05/10/22 176 lb (79.8 kg)  11/24/21 174 lb 12.8 oz (79.3 kg)  04/27/21 180 lb 3.2 oz (81.7 kg)     GEN:  Well nourished, well developed in no  acute distress HEENT: Normal NECK: No JVD; No carotid bruits LYMPHATICS: No lymphadenopathy CARDIAC: RRR, no murmurs, rubs, gallops RESPIRATORY:  Clear to auscultation without rales, wheezing or rhonchi  ABDOMEN: Soft, non-tender, non-distended MUSCULOSKELETAL: Trace edema on the left; No deformity  SKIN:  Warm and dry NEUROLOGIC:  Alert and oriented x 3 PSYCHIATRIC:  Normal affect   ASSESSMENT:    1. Chest pain at rest   2. Mixed hyperlipidemia   3. Essential hypertension    PLAN:    In order of problems listed above:  The patient has no acute EKG changes.  His EKG shows no ischemic changes.  He does have a known history of peripheral arterial disease and risk factors of hypertension and tobacco use.  With chest pain with both typical and atypical features, I have recommended a nuclear stress study to evaluate for cardiac etiology of his chest pain.  Otherwise he will continue on his current medical program.  He is not on antiplatelet therapy because of chronic oral anticoagulation. Continue lisinopril for blood pressure management.  Blood pressure appears to be well controlled at present.      Shared Decision Making/Informed Consent The risks [chest pain, shortness of breath, cardiac arrhythmias, dizziness, blood pressure fluctuations, myocardial infarction, stroke/transient ischemic attack, nausea, vomiting, allergic reaction, radiation exposure, metallic taste sensation and life-threatening complications (estimated to be 1 in 10,000)], benefits (risk stratification, diagnosing coronary artery disease, treatment guidance) and alternatives of a nuclear stress test were discussed in detail with Mr. Dogan and he agrees to proceed.    Medication Adjustments/Labs and Tests Ordered: Current medicines are reviewed at length with the patient today.  Concerns regarding medicines are outlined above.  Orders Placed This Encounter  Procedures   Lipid panel   Comprehensive metabolic panel    Cardiac Stress Test: Informed Consent Details: Physician/Practitioner Attestation; Transcribe to consent form and obtain patient signature   MYOCARDIAL PERFUSION IMAGING   EKG 12-Lead   No orders of the defined types were placed in this encounter.   Patient Instructions  Medication Instructions:  Your physician recommends that you continue on your current medications as directed. Please refer to the Current Medication list given to you today.  *If you need a refill on your cardiac medications before your next appointment, please call your pharmacy*   Lab Work: CMET, Lipids (fasting) If you have labs (blood work) drawn today and your tests are completely normal, you will receive your results only by: [place] (if you have MyChart) OR A paper copy in the mail If you have any lab test that is abnormal or we need to change your treatment, we will call you to review the results.  Testing/Procedures: [place] Test Your physician has requested that you have a lexiscan myoview. For further information please visit [email or web address]. Please follow instruction sheet, as given.  Follow-Up: At [facility], you and your health needs are our priority.  As part of our continuing mission to provide you with exceptional heart care, we have created designated Provider Care Teams.  These Care Teams include your primary Cardiologist (physician) and Advanced Practice Providers (APPs -  Physician Assistants and Nurse Practitioners) who all work together to provide you with the care you need, when you need it.  Your next appointment:   1 year(s)  The format for your next appointment:   In Person  Provider:   Sherren Mocha, MD  or APP    Other Instructions See separate sheet for stress test instructions  Important Information About Sugar         Signed, Sherren Mocha, MD  05/16/2022 1:35 PM    [place]

## 2022-05-10 NOTE — Patient Instructions (Signed)
Medication Instructions:  Your physician recommends that you continue on your current medications as directed. Please refer to the Current Medication list given to you today.  *If you need a refill on your cardiac medications before your next appointment, please call your pharmacy*   Lab Work: CMET, Lipids (fasting) If you have labs (blood work) drawn today and your tests are completely normal, you will receive your results only by: [place] (if you have MyChart) OR A paper copy in the mail If you have any lab test that is abnormal or we need to change your treatment, we will call you to review the results.  Testing/Procedures: [place] Test Your physician has requested that you have a lexiscan myoview. For further information please visit [email or web address]. Please follow instruction sheet, as given.  Follow-Up: At [facility], you and your health needs are our priority.  As part of our continuing mission to provide you with exceptional heart care, we have created designated Provider Care Teams.  These Care Teams include your primary Cardiologist (physician) and Advanced Practice Providers (APPs -  Physician Assistants and Nurse Practitioners) who all work together to provide you with the care you need, when you need it.  Your next appointment:   1 year(s)  The format for your next appointment:   In Person  Provider:   Sherren Mocha, MD  or APP    Other Instructions See separate sheet for stress test instructions  Important Information About Sugar

## 2022-05-16 ENCOUNTER — Encounter: Payer: Self-pay | Admitting: Cardiovascular Disease

## 2022-05-16 ENCOUNTER — Telehealth: Payer: Self-pay

## 2022-05-16 DIAGNOSIS — R079 Chest pain, unspecified: Secondary | ICD-10-CM

## 2022-05-17 NOTE — Telephone Encounter (Signed)
Attestation has been signed, closing encounter

## 2022-05-23 ENCOUNTER — Telehealth (HOSPITAL_COMMUNITY): Payer: Self-pay | Admitting: *Deleted

## 2022-05-23 NOTE — Telephone Encounter (Signed)
Patient given detailed instructions per Myocardial Perfusion Study Information Sheet for the test on 05/30/2022 at 10:00. Patient notified to arrive 15 minutes early and that it is imperative to arrive on time for appointment to keep from having the test rescheduled.  If you need to cancel or reschedule your appointment, please call the office within 24 hours of your appointment. . Patient verbalized understanding.[name]

## 2022-05-24 ENCOUNTER — Ambulatory Visit: Payer: BC Managed Care – PPO | Attending: Cardiovascular Disease

## 2022-05-24 DIAGNOSIS — E782 Mixed hyperlipidemia: Secondary | ICD-10-CM

## 2022-05-24 LAB — COMPREHENSIVE METABOLIC PANEL
ALT: 27 IU/L (ref 0–44)
AST: 33 IU/L (ref 0–40)
Albumin/Globulin Ratio: 1.9 (ref 1.2–2.2)
Albumin: 4.5 g/dL (ref 3.8–4.9)
Alkaline Phosphatase: 108 IU/L (ref 44–121)
BUN/Creatinine Ratio: 13 (ref 9–20)
BUN: 11 mg/dL (ref 6–24)
Bilirubin Total: 0.4 mg/dL (ref 0.0–1.2)
CO2: 23 mmol/L (ref 20–29)
Calcium: 10.2 mg/dL (ref 8.7–10.2)
Chloride: 101 mmol/L (ref 96–106)
Creatinine, Ser: 0.85 mg/dL (ref 0.76–1.27)
Globulin, Total: 2.4 g/dL (ref 1.5–4.5)
Glucose: 114 mg/dL — ABNORMAL HIGH (ref 70–99)
Potassium: 3.8 mmol/L (ref 3.5–5.2)
Sodium: 139 mmol/L (ref 134–144)
Total Protein: 6.9 g/dL (ref 6.0–8.5)
eGFR: 100 mL/min/{1.73_m2} (ref >59)

## 2022-05-24 LAB — LIPID PANEL
Chol/HDL Ratio: 2.2 ratio (ref 0.0–5.0)
Cholesterol, Total: 204 mg/dL — ABNORMAL HIGH (ref 100–199)
HDL: 92 mg/dL (ref >39)
LDL Chol Calc (NIH): 94 mg/dL (ref 0–99)
Triglycerides: 105 mg/dL (ref 0–149)
VLDL Cholesterol Cal: 18 mg/dL (ref 5–40)

## 2022-05-27 ENCOUNTER — Telehealth: Payer: Self-pay | Admitting: Cardiovascular Disease

## 2022-05-27 MED ORDER — ATORVASTATIN CALCIUM 40 MG PO TABS: 40.0000 mg | ORAL_TABLET | Freq: Every day | ORAL | 3 refills | Status: DC

## 2022-05-27 NOTE — Telephone Encounter (Signed)
Returned call to patient who verified that he is still taking Atorvastatin '40mg'$  daily. He has not stopped and states he is tolerating it well. He denies dietary changes, but admits that "I smoke a lot and I drink probably more than I have and need to." Advised that alcohol can certainly increase it, since your body will convert it to sugar/simple carbs and for him to try and reduce when possible. Will update med list and route to MD as Juluis Rainier.

## 2022-05-27 NOTE — Telephone Encounter (Signed)
Pt c/o medication issue:  1. Name of Medication: starts with an "a" 40 mg  2. How are you currently taking this medication (dosage and times per day)? 1 tablet daily  3. Are you having a reaction (difficulty breathing--STAT)? no  4. What is your medication issue? Patient states he was told one of the medications he is taking was not on his medication list.

## 2022-05-30 ENCOUNTER — Telehealth: Payer: Self-pay

## 2022-05-30 ENCOUNTER — Ambulatory Visit (HOSPITAL_COMMUNITY): Payer: BC Managed Care – PPO | Attending: Cardiovascular Disease

## 2022-05-30 DIAGNOSIS — R079 Chest pain, unspecified: Secondary | ICD-10-CM | POA: Diagnosis present

## 2022-05-30 DIAGNOSIS — Z79899 Other long term (current) drug therapy: Secondary | ICD-10-CM

## 2022-05-30 DIAGNOSIS — E782 Mixed hyperlipidemia: Secondary | ICD-10-CM

## 2022-05-30 LAB — MYOCARDIAL PERFUSION IMAGING
LV dias vol: 95 mL (ref 62–150)
LV sys vol: 36 mL
Nuc Stress EF: 63 %
Peak HR: 133 {beats}/min
Rest HR: 90 {beats}/min
Rest Nuclear Isotope Dose: 10.8 mCi
SDS: 1
SRS: 1
SSS: 2
ST Depression (mm): 0 mm
Stress Nuclear Isotope Dose: 31.8 mCi
TID: 1.17

## 2022-05-30 MED ORDER — TECHNETIUM TC 99M TETROFOSMIN IV KIT
10.8000 | PACK | Freq: Once | INTRAVENOUS | Status: AC | PRN
  Administered 2022-05-30: 10.8 via INTRAVENOUS

## 2022-05-30 MED ORDER — TECHNETIUM TC 99M TETROFOSMIN IV KIT
31.8000 | PACK | Freq: Once | INTRAVENOUS | Status: AC | PRN
  Administered 2022-05-30: 31.8 via INTRAVENOUS

## 2022-05-30 MED ORDER — EZETIMIBE 10 MG PO TABS: 10.0000 mg | ORAL_TABLET | Freq: Every day | ORAL | 3 refills | Status: DC

## 2022-05-30 MED ORDER — REGADENOSON 0.4 MG/5ML IV SOLN
0.4000 mg | Freq: Once | INTRAVENOUS | Status: AC
  Administered 2022-05-30: 0.4 mg via INTRAVENOUS

## 2022-05-30 NOTE — Telephone Encounter (Signed)
-----   Message from Sherren Mocha, MD sent at 05/29/2022 11:23 PM EDT ----- Would continue and add zetia 10 mg daily. repeat lipids and lft's in 3 months thx

## 2022-05-30 NOTE — Telephone Encounter (Signed)
Spoke with patient who verbalized understanding and agrees to begin zetia '10mg'$  daily. Repeat labs ordered and scheduled for 08/31/22.

## 2022-06-22 ENCOUNTER — Other Ambulatory Visit: Payer: Self-pay | Admitting: Cardiovascular Disease

## 2022-06-22 DIAGNOSIS — I10 Essential (primary) hypertension: Secondary | ICD-10-CM

## 2022-06-22 DIAGNOSIS — I739 Peripheral vascular disease, unspecified: Secondary | ICD-10-CM

## 2022-08-10 ENCOUNTER — Other Ambulatory Visit: Payer: Self-pay | Admitting: Cardiovascular Disease

## 2022-08-10 DIAGNOSIS — Z86718 Personal history of other venous thrombosis and embolism: Secondary | ICD-10-CM

## 2022-08-11 NOTE — Telephone Encounter (Signed)
Prescription refill request for Eliquis received. Indication: DVT Last office visit: 05/10/22 Burt Knack)  Scr: 0.85 (05/24/22)  Age: 62 Weight: 79.8kg  Appropriate dose and refill sent to requested pharmacy.

## 2022-08-30 ENCOUNTER — Ambulatory Visit: Payer: BC Managed Care – PPO | Admitting: Cardiovascular Disease

## 2022-08-30 ENCOUNTER — Ambulatory Visit: Payer: BC Managed Care – PPO | Attending: Cardiovascular Disease

## 2022-08-30 DIAGNOSIS — E782 Mixed hyperlipidemia: Secondary | ICD-10-CM

## 2022-08-30 DIAGNOSIS — Z79899 Other long term (current) drug therapy: Secondary | ICD-10-CM

## 2022-08-30 LAB — LIPID PANEL
Chol/HDL Ratio: 2.3 ratio (ref 0.0–5.0)
Cholesterol, Total: 190 mg/dL (ref 100–199)
HDL: 83 mg/dL (ref >39)
LDL Chol Calc (NIH): 93 mg/dL (ref 0–99)
Triglycerides: 75 mg/dL (ref 0–149)
VLDL Cholesterol Cal: 14 mg/dL (ref 5–40)

## 2022-08-30 LAB — HEPATIC FUNCTION PANEL
ALT: 25 IU/L (ref 0–44)
AST: 34 IU/L (ref 0–40)
Albumin: 4.5 g/dL (ref 3.8–4.9)
Alkaline Phosphatase: 98 IU/L (ref 44–121)
Bilirubin Total: 0.4 mg/dL (ref 0.0–1.2)
Bilirubin, Direct: 0.15 mg/dL (ref 0.00–0.40)
Total Protein: 6.8 g/dL (ref 6.0–8.5)

## 2022-08-31 ENCOUNTER — Other Ambulatory Visit: Payer: BC Managed Care – PPO

## 2022-09-02 ENCOUNTER — Telehealth: Payer: Self-pay | Admitting: Cardiovascular Disease

## 2022-09-02 NOTE — Telephone Encounter (Signed)
Called and spoke with patient who states that he is not interested in the injectable meds. He verbalized understanding on stopping zetia. MAR updated at this time.

## 2022-09-02 NOTE — Telephone Encounter (Signed)
-----  Message from Sherren Mocha, MD sent at 09/02/2022  6:41 AM EST ----- Lab reviewed. No change in lipids with addition of zetia. Would advise him to stop this medication since it hasn't moved his lipids at all. I don't think he would be interested in pursuing injectable therapies. OK to offer lipid clinic referral if he is willing to consider Repatha/Praluent. Otherwise would continue current Rx. thx

## 2022-10-24 ENCOUNTER — Ambulatory Visit (INDEPENDENT_AMBULATORY_CARE_PROVIDER_SITE_OTHER): Payer: BC Managed Care – PPO | Admitting: Internal Medicine

## 2022-10-24 ENCOUNTER — Encounter: Payer: Self-pay | Admitting: Internal Medicine

## 2022-10-24 ENCOUNTER — Ambulatory Visit (HOSPITAL_BASED_OUTPATIENT_CLINIC_OR_DEPARTMENT_OTHER)
Admission: RE | Admit: 2022-10-24 | Discharge: 2022-10-24 | Disposition: A | Discharge status: Home / Self Care | Payer: BC Managed Care – PPO | Source: Ambulatory Visit | Attending: Internal Medicine | Admitting: Internal Medicine

## 2022-10-24 VITALS — BP 126/70 | HR 84 | Temp 98.1°F | Resp 16 | Ht 72.0 in | Wt 180.1 lb

## 2022-10-24 DIAGNOSIS — L723 Sebaceous cyst: Secondary | ICD-10-CM

## 2022-10-24 DIAGNOSIS — M25552 Pain in left hip: Secondary | ICD-10-CM

## 2022-10-24 DIAGNOSIS — I739 Peripheral vascular disease, unspecified: Secondary | ICD-10-CM

## 2022-10-24 DIAGNOSIS — R079 Chest pain, unspecified: Secondary | ICD-10-CM

## 2022-10-24 NOTE — Progress Notes (Signed)
Subjective:    Patient ID: [name], male    DOB: 01-24-1961, 62 y.o.   MRN: [id number]  DOS:  10/24/2022 Type of visit - description: Acute.  LOV 03-2021 (virtual) LOV in person 2021  His chief complaint today is pain. For the last 6 months he has mild bilateral low back pain. For the last 3 months has been increasingly having pain located at the left groin and radiating anteriorly to the L thigh  Pain is the most intense whenever he starts walking to the point that sometimes he has been about to fall. When asked about claudication per se  (calf pain) he said "also mild whole left leg hurts".  Denies fever or chills. No recent injury. Chest pain?  "I always have chest pain".  Unchanged from previous sxs  No shortness of breath   Review of Systems See above   Past Medical History:  Diagnosis Date   Chest pain    stres test neg x 2, cath 5/12; minimal LAD irregs; no obs CAD; normal LVF   Clotting disorder (HCC)    dvt   GERD (gastroesophageal reflux disease)    History of DVT of lower extremity    on chronic coumadin   Hypertension    Internal hemorrhoids    Cscope 2007   PAD (peripheral artery disease) ([place])    a. left leg ischemia tx wtih embolectomy of left fem, pop, tib arteries with Dr. Scot Dock - 08/2006;  b. known occlusion of right pop with collats - med Rx;  c.ABI's 8/09: R 1.0; L 0.91    PFO (patent foramen ovale)    per 08/2006 discharge summary, TEE showed EF 60%, small PFO with minimal right-to-left shunt with Valsalva; not felt to be the source of emboli, lifelong Coumadin recommended   Pneumonia    history of   Polysubstance abuse ([place])    marijuana only   Renal infarct ([place])    R    Past Surgical History:  Procedure Laterality Date   CHOLECYSTECTOMY     COLONOSCOPY     INGUINAL HERNIA REPAIR Left 07/16/2019   Procedure: LAPAROSCOPIC LEFT INGUINAL HERNIA REPAIR WITH MESH;  Surgeon: Ralene Ok, MD;  Location: [facility];  Service: General;   Laterality: Left;   left leg blood clot removal 2009  2009   TONSILLECTOMY     WRIST SURGERY      Current Outpatient Medications  Medication Instructions   apixaban (ELIQUIS) 5 MG TABS tablet Take 1 tablet by mouth twice daily   atorvastatin (LIPITOR) 40 mg, Oral, Daily   esomeprazole (NEXIUM) 20 mg, Oral, Daily before breakfast   fexofenadine (ALLEGRA) 180 mg, Oral, Daily   HYDROcodone-acetaminophen (NORCO) 7.5-325 MG tablet 1 tablet, Every 6 hours PRN   lisinopril (ZESTRIL) 10 mg, Oral, Daily   methylPREDNISolone (MEDROL) 4 mg, Daily   Multiple Vitamin (MULTIVITAMIN) tablet 1 tablet, Oral, Daily       Objective:   Physical Exam Genitourinary:   Skin:         Comments: Although he complained about pain at the whole leg,   pain is most noticeable at groin and anterior thigh    BP 126/70   Pulse 84   Temp 98.1 F (36.7 C) (Oral)   Resp 16   Ht 6' (1.829 m)   Wt 180 lb 2 oz (81.7 kg)   SpO2 97%   BMI 24.43 kg/m  General:   Well developed, NAD, BMI noted. HEENT:  Normocephalic .  Face symmetric, atraumatic Lungs:  CTA B Normal respiratory effort, no intercostal retractions, no accessory muscle use. Heart: RRR,  no murmur.  Lower extremities: Palpable pedal and femoral pulses bilaterally.  Left calf non-TTP. No swelling. Skin: Not pale. Not jaundice MSK: No TTP at the lumbar sacral spine or SI areas. Hip rotation right: Normal Hip rotation left: It triggered some pain at the right groin. GU: No inguinal hernias Scrotal contents normal Right scrotum: Has a 2 cm soft mass not attached to the testicles consistent with a cyst Neurologic:  alert & oriented X3.  Speech normal, gait: Limited, reports severe pain at the left groin with ambulation. Motor is normal.  Straight leg test negative Psych--  Cognition and judgment appear intact.  Cooperative with normal attention span and concentration.  Behavior appropriate. No anxious or depressed appearing.       Assessment     Assessment HTN Hyperlipidemia- statin intolerant  GERD -EGD 2002 and 2015 (due to nausea) : Gastritis, duodenal inflammation., bx benign, no H. pylori Chest pain:  stres test neg x 2, cath 5/12; minimal LAD irregs; no obs CAD; normal LVF; stress test: 01/16/2014: Low risk study Vascular, + Hypercoagulable state -DVT, leg on chronic Coumadin (per cardiology) PAD:    -2008: L leg ischemia >> embolectomy of left fem, pop, tib arteries ( Dr. Scot Dock ) -known occlusion of right pop with collats - med Rx - R renal infarct Polysubstance abuse Smoker    PLAN: Left hip pain: The patient reports 68-month history of left hip and leg pain.   H/o DVT and PVD, anticoagulated. On exam , has good femoral and pedal pulses.  Toes are well-perfused.  I think the pain is MSK related. Plan: X-ray left hip, refer to Ortho Cyst: Located near the right scrotum, chronic issue, patient would like something done about it.  Refer to general surgery Polysubstance abuse: Denies cocaine, states "I still do marijuana and alcohol". Chest pain: Saw cardiology 05/10/2022, complaining of chest pain, stress test WNL.  Today the patient reports he has chronic chest pain. The patient is seldom seen for routine checkups, encourage a CPX.

## 2022-10-24 NOTE — Patient Instructions (Addendum)
Go to the first floor, get a x-ray  We are referring you to the orthopedic doctor  Tylenol  500 mg OTC 2 tabs a day every 8 hours as needed for pain  Schedule a follow-up for complete physical at your convenience, fasting.  Vaccines I recommend: Shingrix (shingles) Covid booster Tdap (tetanus)

## 2022-10-24 NOTE — Assessment & Plan Note (Signed)
Left hip pain: The patient reports 88-month history of left hip and leg pain.   H/o DVT and PVD, anticoagulated. On exam , has good femoral and pedal pulses.  Toes are well-perfused.  I think the pain is MSK related. Plan: X-ray left hip, refer to Ortho Cyst: Located near the right scrotum, chronic issue, patient would like something done about it.  Refer to general surgery Polysubstance abuse: Denies cocaine, states "I still do marijuana and alcohol". Chest pain: Saw cardiology 05/10/2022, complaining of chest pain, stress test WNL.  Today the patient reports he has chronic chest pain. The patient is seldom seen for routine checkups, encourage a CPX.

## 2022-10-25 ENCOUNTER — Telehealth: Payer: Self-pay | Admitting: Internal Medicine

## 2022-10-25 NOTE — Telephone Encounter (Signed)
Spoke w/ Pt- asked if he wanted letter sent to him via Mychart or pick up at front desk. Pt requests letter to be sent to his mychart- he will have his daughter help him access it.

## 2022-10-25 NOTE — Telephone Encounter (Signed)
Please advise 

## 2022-10-25 NOTE — Telephone Encounter (Signed)
Pt called stating that he needed a letter excusing him from work from today (3.19.24) to 3.22.24 due to issues with his hip.

## 2022-10-25 NOTE — Telephone Encounter (Signed)
That is okay.

## 2022-10-26 ENCOUNTER — Telehealth: Payer: Self-pay | Admitting: Cardiovascular Disease

## 2022-10-26 NOTE — Telephone Encounter (Signed)
Patient states that he is having issues with his let and would like a call back to discuss if he should apply for disability. Please advise.

## 2022-10-26 NOTE — Telephone Encounter (Signed)
I spoke with patient. He has been having leg pain recently. Saw PCP and has been referred to orthopedics.  He reports no change in his heart issues.  Chest pain from time to time but has not increased. He is asking about going on disability due to leg pain.  I asked him to reach out to PCP as they have evaluated him for this recently

## 2022-10-28 ENCOUNTER — Other Ambulatory Visit: Payer: Self-pay

## 2022-10-28 ENCOUNTER — Ambulatory Visit: Payer: BC Managed Care – PPO | Admitting: Internal Medicine

## 2022-10-28 ENCOUNTER — Ambulatory Visit (INDEPENDENT_AMBULATORY_CARE_PROVIDER_SITE_OTHER): Payer: BC Managed Care – PPO | Admitting: Surgical

## 2022-10-28 DIAGNOSIS — M1612 Unilateral primary osteoarthritis, left hip: Secondary | ICD-10-CM | POA: Diagnosis not present

## 2022-10-29 ENCOUNTER — Encounter: Payer: Self-pay | Admitting: Surgical

## 2022-10-29 MED ORDER — LIDOCAINE HCL 1 % IJ SOLN
5.0000 mL | INTRAMUSCULAR | Status: AC | PRN
  Administered 2022-10-28: 5 mL

## 2022-10-29 MED ORDER — BUPIVACAINE HCL 0.25 % IJ SOLN
4.0000 mL | INTRAMUSCULAR | Status: AC | PRN
  Administered 2022-10-28: 4 mL via INTRA_ARTICULAR

## 2022-10-29 MED ORDER — METHYLPREDNISOLONE ACETATE 40 MG/ML IJ SUSP
40.0000 mg | INTRAMUSCULAR | Status: AC | PRN
  Administered 2022-10-28: 40 mg via INTRA_ARTICULAR

## 2022-10-29 NOTE — Progress Notes (Signed)
Office Visit Note   Patient: [name]           Date of Birth: May 05, 1961           MRN: [id number] Visit Date: 10/28/2022 Requested by: Colon Branch, [address] [place],  [place] [address] PCP: Colon Branch, MD  Subjective: Chief Complaint  Patient presents with   Left Hip - Pain    HPI: [name] is a 62 y.o. male who presents to the office reporting left hip pain.  Patient states that he has had intermittent left hip pain over the last 7 years that has become more more consistent recently.  Localizes pain to the anterior groin with radiation down to the left knee.  He has low back pain as well but this is a chronic problem for him as well.  He has no numbness or tingling in the left leg but does note some numbness and tingling in the anterior lateral thigh in the right leg but this is not associated with any radicular pain in the right leg.  Pain in the left hip causes substantial pain especially toward the end of the day.  He works Theatre manager a Radiation protection practitioner.  Pain will wake him up at night, especially recently disturbing sleep for him and his wife.  He has been significantly worsening over the last several weeks in particular.  He is taking Tylenol for pain control.  He has history of smoking and DVT in the left leg.  Currently denies any calf pain.  Takes Eliquis which she is compliant with.  He has had to alter the way that he walks due to constantly thinking about this groin pain.  No history of prior surgery on his hip or back.  It bothers him with hip range of motion and especially with getting into a car..                ROS: All systems reviewed are negative as they relate to the chief complaint within the history of present illness.  Patient denies fevers or chills.  Assessment & Plan: Visit Diagnoses:  1. Unilateral primary osteoarthritis, left hip     Plan: Patient is a 62 year old male who presents for evaluation of left hip pain.   Localizes pain to the groin.  Has been ongoing for several years but severely worsening over the last several weeks to the point where it is disturbing his sleep.  He is reproduced with hip range of motion.  He has radiographs ordered by his PCP Dr. Larose Kells that demonstrate mild degenerative changes of the left hip joint.  Suspect that he has more advanced arthritis than radiographs demonstrate.  Ultrasound probe was placed over the left hip demonstrating hip joint effusion.  We discussed the options available to patient including living with his symptoms versus further imaging of the left hip with MRI scan versus diagnostic/therapeutic left hip joint injection versus working with physical therapy.  He states that he has missed about 1 week of work due to this hip pain and he really wants to return to work on Monday so he would like to try the injection to see if this will calm down pain over the weekend to the point where he can get back to work.  Under ultrasound guidance, cortisone injection was successfully delivered into the left hip joint.  Color Doppler was utilized to avoid any vascular structures.  He tolerated procedure well.  Did  have fairly substantial relief of symptoms after 5 to 10 minutes following the injection.  Follow-up in 4 to 6 weeks if he is having continued symptoms.  Call with any concerns in the meantime.  Follow-Up Instructions: No follow-ups on file.   Orders:  Orders Placed This Encounter  Procedures   US Guided Needle Placement - No Linked Charges   No orders of the defined types were placed in this encounter.     Procedures: Large Joint Inj: L hip joint on 10/28/2022 11:58 AM Indications: pain and diagnostic evaluation Details: 22 G 3.5 in needle, ultrasound-guided lateral approach  Arthrogram: No  Medications: 5 mL lidocaine 1 %; 4 mL bupivacaine 0.25 %; 40 mg methylPREDNISolone acetate 40 MG/ML Outcome: tolerated well, no immediate complications Procedure, treatment  alternatives, risks and benefits explained, specific risks discussed. Consent was given by the patient. Immediately prior to procedure a time out was called to verify the correct patient, procedure, equipment, support staff and site/side marked as required. Patient was prepped and draped in the usual sterile fashion.       Clinical Data: No additional findings.  Objective: Vital Signs: There were no vitals taken for this visit.  Physical Exam:  Constitutional: Patient appears well-developed HEENT:  Head: Normocephalic Eyes:EOM are normal Neck: Normal range of motion Cardiovascular: Normal rate Pulmonary/chest: Effort normal Neurologic: Patient is alert Skin: Skin is warm Psychiatric: Patient has normal mood and affect  Ortho Exam: Ortho exam demonstrates left hip with increased pain with terminal hip flexion and internal rotation with positive FADIR sign.  He has positive Stinchfield sign on left.  Negative FADIR sign and negative Stinchfield sign on right.  Negative straight leg raise bilaterally.  No tenderness over the greater trochanter bilaterally.  No calf tenderness bilaterally.  Negative Homans' sign bilaterally.  He has intact hip flexion strength, quadricep, hamstring, dorsiflexion, plantarflexion.  He does have reproduced pain with active hip flexion on the left but not on the right.  There is no cellulitis or skin changes noted overlying the anterior aspect of the groin.  Specialty Comments:  No specialty comments available.  Imaging: No results found.   PMFS History: Patient Active Problem List   Diagnosis Date Noted   High risk heterosexual behavior 02/05/2021   History of colonic polyps 11/04/2019   PCP NOTES >>>>>>>>>>>>>> 07/26/2016   GERD (gastroesophageal reflux disease) 07/26/2016   Annual physical exam 07/25/2016   HTN (hypertension) 09/16/2014   Nausea without vomiting 11/29/2013   Deep vein thrombosis ([facility]) 10/06/2010   Long term current use of  anticoagulant 10/06/2010   Renal infarct ([facility]) 10/06/2010   Dyslipidemia 09/03/2009   CHEST PAIN-UNSPECIFIED 09/03/2009   SHOULDER PAIN, RIGHT 03/02/2009   PERIPHERAL VASCULAR DISEASE 02/11/2009   SKIN LESION 02/04/2008   Polysubstance abuse ([facility]) 05/28/2007   DISORDER, VASCULAR, KIDNEY 05/28/2007   DVT, HX OF 12/25/2006   Past Medical History:  Diagnosis Date   Chest pain    stres test neg x 2, cath 5/12; minimal LAD irregs; no obs CAD; normal LVF   Clotting disorder ([facility])    dvt   GERD (gastroesophageal reflux disease)    History of DVT of lower extremity    on chronic coumadin   Hypertension    Internal hemorrhoids    Cscope 2007   PAD (peripheral artery disease) ([facility])    a. left leg ischemia tx wtih embolectomy of left fem, pop, tib arteries with Dr. Scot Dock - 08/2006;  b. known occlusion of right pop with  collats - med Rx;  c.ABI's 8/09: R 1.0; L 0.91    PFO (patent foramen ovale)    per 08/2006 discharge summary, TEE showed EF 60%, small PFO with minimal right-to-left shunt with Valsalva; not felt to be the source of emboli, lifelong Coumadin recommended   Pneumonia    history of   Polysubstance abuse ([facility])    marijuana only   Renal infarct ([facility])    R    Family History  Problem Relation Age of Onset   Hypertension Mother    Breast cancer Mother    Hypertension Father    CAD Father    Diabetes Father    Lung cancer Maternal Uncle    Pancreatic cancer Brother    Prostate cancer Maternal Uncle    Heart disease Paternal Grandmother    Colon cancer Neg Hx    Stomach cancer Neg Hx    Esophageal cancer Neg Hx    Rectal cancer Neg Hx     Past Surgical History:  Procedure Laterality Date   CHOLECYSTECTOMY     COLONOSCOPY     INGUINAL HERNIA REPAIR Left 07/16/2019   Procedure: LAPAROSCOPIC LEFT INGUINAL HERNIA REPAIR WITH MESH;  Surgeon: Ralene Ok, MD;  Location: [facility];  Service: General;  Laterality: Left;   left leg blood clot removal 2009  2009    TONSILLECTOMY     WRIST SURGERY     Social History   Occupational History   Occupation: Editor, commissioning, driver   Tobacco Use   Smoking status: Every Day    Packs/day: 1.00    Years: 30.00    Additional pack years: 0.00    Total pack years: 30.00    Types: Cigarettes   Smokeless tobacco: Never   Tobacco comments:    1 to 1.5 ppd   Vaping Use   Vaping Use: Never used  Substance and Sexual Activity   Alcohol use: Yes    Comment: ETOH most days ," 2- 40 oz beers , and a pint of liquor   Drug use: Yes    Types: Marijuana    Comment: 07/09/19   Sexual activity: Yes    Partners: Female

## 2022-11-11 ENCOUNTER — Telehealth: Payer: Self-pay | Admitting: *Deleted

## 2022-11-11 NOTE — Telephone Encounter (Signed)
   Pre-operative Risk Assessment    Patient Name: [name]  DOB: [birth year] MRN: [id number]      Request for Surgical Clearance    Procedure:   EXCISION OF SOFT TISSUE MASS  Date of Surgery:  Clearance TBD                                 Surgeon:  Kris Mouton, MD Surgeon's Group or Practice Name:  [facility] Phone number:  [phone] Fax number:  [phone]   Type of Clearance Requested:   - Pharmacy:  Hold Apixaban (Eliquis) NOT INDICATED HOW LONG   Type of Anesthesia:  General    Additional requests/questions:    [name]   11/11/2022, 4:28 PM

## 2022-11-14 ENCOUNTER — Other Ambulatory Visit: Payer: Self-pay | Admitting: Surgery

## 2022-11-14 DIAGNOSIS — D171 Benign lipomatous neoplasm of skin and subcutaneous tissue of trunk: Secondary | ICD-10-CM

## 2022-11-14 DIAGNOSIS — K4090 Unilateral inguinal hernia, without obstruction or gangrene, not specified as recurrent: Secondary | ICD-10-CM

## 2022-11-14 NOTE — Telephone Encounter (Signed)
Patient with diagnosis of DVT, renal infarct on Eliquis for anticoagulation.    Procedure: excision of soft tissue mass Date of procedure: TBD   CrCl 108 Platelet count 195  Per office protocol, patient can hold Eliquis for 1 days prior to procedure.   Patient will not need bridging with Lovenox (enoxaparin) around procedure.  **This guidance is not considered finalized until pre-operative APP has relayed final recommendations.**

## 2022-11-14 NOTE — Telephone Encounter (Signed)
   Name: [name]  DOB: May 24, 1961  MRN: [id number]  Primary Cardiologist: [name], MD   Preoperative team, please contact this patient and set up a phone call appointment for further preoperative risk assessment. Please obtain consent and complete medication review. Thank you for your help.  I confirm that guidance regarding antiplatelet and oral anticoagulation therapy has been completed and, if necessary, noted below.  Per office protocol, patient can hold Eliquis for 1 days prior to procedure.   Patient will not need bridging with Lovenox (enoxaparin) around procedure.   Ronney Asters, NP 11/14/2022, 11:15 AM [facility]

## 2022-11-15 ENCOUNTER — Telehealth: Payer: Self-pay | Admitting: *Deleted

## 2022-11-15 NOTE — Telephone Encounter (Signed)
Pt has been scheduled for tele pre op appt 11/22/22 @ 10:20. Med rec and consent are done.      Patient Consent for Virtual Visit        [name] has provided verbal consent on 11/15/2022 for a virtual visit (video or telephone).   CONSENT FOR VIRTUAL VISIT FOR:  [name]  By participating in this virtual visit I agree to the following:  I hereby voluntarily request, consent and authorize [facility] and its employed or contracted physicians, physician assistants, nurse practitioners or other licensed health care professionals (the Practitioner), to provide me with telemedicine health care services (the "Services") as deemed necessary by the treating Practitioner. I acknowledge and consent to receive the Services by the Practitioner via telemedicine. I understand that the telemedicine visit will involve communicating with the Practitioner through live audiovisual communication technology and the disclosure of certain medical information by electronic transmission. I acknowledge that I have been given the opportunity to request an in-person assessment or other available alternative prior to the telemedicine visit and am voluntarily participating in the telemedicine visit.  I understand that I have the right to withhold or withdraw my consent to the use of telemedicine in the course of my care at any time, without affecting my right to future care or treatment, and that the Practitioner or I may terminate the telemedicine visit at any time. I understand that I have the right to inspect all information obtained and/or recorded in the course of the telemedicine visit and may receive copies of available information for a reasonable fee.  I understand that some of the potential risks of receiving the Services via telemedicine include:  Delay or interruption in medical evaluation due to technological equipment failure or disruption; Information transmitted may not be sufficient  (e.g. poor resolution of images) to allow for appropriate medical decision making by the Practitioner; and/or  In rare instances, security protocols could fail, causing a breach of personal health information.  Furthermore, I acknowledge that it is my responsibility to provide information about my medical history, conditions and care that is complete and accurate to the best of my ability. I acknowledge that Practitioner's advice, recommendations, and/or decision may be based on factors not within their control, such as incomplete or inaccurate data provided by me or distortions of diagnostic images or specimens that may result from electronic transmissions. I understand that the practice of medicine is not an exact science and that Practitioner makes no warranties or guarantees regarding treatment outcomes. I acknowledge that a copy of this consent can be made available to me via my patient portal [facility] MyChart), or I can request a printed copy by calling the office of [facility].    I understand that my insurance will be billed for this visit.   I have read or had this consent read to me. I understand the contents of this consent, which adequately explains the benefits and risks of the Services being provided via telemedicine.  I have been provided ample opportunity to ask questions regarding this consent and the Services and have had my questions answered to my satisfaction. I give my informed consent for the services to be provided through the use of telemedicine in my medical care

## 2022-11-15 NOTE — Telephone Encounter (Signed)
Pt has been scheduled for tele pre op appt 11/22/22 @ 10:20. Med rec and consent are done.

## 2022-11-17 ENCOUNTER — Encounter: Payer: Self-pay | Admitting: Internal Medicine

## 2022-11-22 ENCOUNTER — Ambulatory Visit: Payer: BC Managed Care – PPO | Attending: Interventional Cardiology

## 2022-11-22 DIAGNOSIS — Z01810 Encounter for preprocedural cardiovascular examination: Secondary | ICD-10-CM

## 2022-11-22 NOTE — Progress Notes (Signed)
Virtual Visit via Telephone Note   Because of [name]'s co-morbid illnesses, he is at least at moderate risk for complications without adequate follow up.  This format is felt to be most appropriate for this patient at this time.  The patient did not have access to video technology/had technical difficulties with video requiring transitioning to audio format only (telephone).  All issues noted in this document were discussed and addressed.  No physical exam could be performed with this format.  Please refer to the patient's chart for his consent to telehealth for [facility].  Evaluation Performed:  Preoperative cardiovascular risk assessment _____________   Date:  11/22/2022   Patient ID:  [name], DOB [birth year], MRN [id number] Patient Location:  Home Provider location:   Office  Primary Care Provider:  Wanda Plump, MD Primary Cardiologist:  [name], MD  Chief Complaint / Patient Profile   62 y.o. y/o male with a h/o peripheral artery disease, hypercoagulable disorder, left femoral-popliteal artery occlusions in 2008 requiring embolectomy, renal infarction at that time, no CAD on cardiac catheterization 2012, PFO who is pending's excision of soft tissue mass on the groin and presents today for telephonic preoperative cardiovascular risk assessment.  History of Present Illness    [name] is a 62 y.o. male who presents via audio/video conferencing for a telehealth visit today.  Pt was last seen in cardiology clinic on 05/10/2022 by Dr. Excell Seltzer.  At that time [name] was doing well .  The patient is now pending procedure as outlined above. Since his last visit, he tells me he has been doing well from a cardiovascular standpoint.  No chest pain or shortness of breath.  He states that he believes his surgery is on May 9 and he has having an excision of the soft tissue mass on his groin.  He recently was diagnosed with left leg  arthritis and had a steroid injection about a month ago.  This is wearing off.  He does have some pain in this left leg.  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  Usually he is exhausted by the time he gets home and does not do any routine exercise after hours.  Because he has such a physical job, he is scored a 5.62 METS on the DASI.  This exceeds the 4 METS minimum requirement.  He can hold his Eliquis 1 day prior to his procedure and restart it as soon as his medically safe for to do so.  Past Medical History    Past Medical History:  Diagnosis Date   Chest pain    stres test neg x 2, cath 5/12; minimal LAD irregs; no obs CAD; normal LVF   Clotting disorder (HCC)    dvt   GERD (gastroesophageal reflux disease)    History of DVT of lower extremity    on chronic coumadin   Hypertension    Internal hemorrhoids    Cscope 2007   PAD (peripheral artery disease) ([facility])    a. left leg ischemia tx wtih embolectomy of left fem, pop, tib arteries with Dr. Edilia Bo - 08/2006;  b. known occlusion of right pop with collats - med Rx;  c.ABI's 8/09: R 1.0; L 0.91    PFO (patent foramen ovale)    per 08/2006 discharge summary, TEE showed EF 60%, small PFO with minimal right-to-left shunt with Valsalva; not felt to be the  source of emboli, lifelong Coumadin recommended   Pneumonia    history of   Polysubstance abuse ([facility])    marijuana only   Renal infarct [facility])    R   Past Surgical History:  Procedure Laterality Date   CHOLECYSTECTOMY     COLONOSCOPY     INGUINAL HERNIA REPAIR Left 07/16/2019   Procedure: LAPAROSCOPIC LEFT INGUINAL HERNIA REPAIR WITH MESH;  Surgeon: Axel Filler, MD;  Location: [facility] OR;  Service: General;  Laterality: Left;   left leg blood clot removal 2009  2009   TONSILLECTOMY     WRIST SURGERY      Allergies  Allergies  Allergen Reactions   Pravastatin Itching   Simvastatin Itching   Lisinopril Rash    Tolerating  rash currently    Home Medications    Prior to Admission medications   Medication Sig Start Date End Date Taking? Authorizing Provider  apixaban (ELIQUIS) 5 MG TABS tablet Take 1 tablet by mouth twice daily 08/11/22   [name], MD  atorvastatin (LIPITOR) 40 MG tablet Take 1 tablet (40 mg total) by mouth daily. 05/27/22   [name], MD  esomeprazole (NEXIUM) 20 MG packet Take 20 mg by mouth daily before breakfast.    [provider]  fexofenadine (ALLEGRA) 180 MG tablet Take 180 mg by mouth daily.    [provider]  HYDROcodone-acetaminophen (NORCO) 7.5-325 MG tablet Take 1 tablet by mouth every 6 (six) hours as needed for pain. Patient not taking: Reported on 10/24/2022 05/03/22   [provider]  lisinopril (ZESTRIL) 10 MG tablet Take 1 tablet by mouth once daily 06/22/22   [name], MD  methylPREDNISolone (MEDROL) 4 MG tablet Take 4 mg by mouth daily. Patient not taking: Reported on 10/24/2022 05/03/22   [provider]  Multiple Vitamin (MULTIVITAMIN) tablet Take 1 tablet by mouth daily.    [provider]    Physical Exam    Vital Signs:  [name] does not have vital signs available for review today.  Given telephonic nature of communication, physical exam is limited. AAOx3. NAD. Normal affect.  Speech and respirations are unlabored.  Accessory Clinical Findings    None  Assessment & Plan    1.  Preoperative Cardiovascular Risk Assessment:  [name]'s perioperative risk of a major cardiac event is 0.4% according to the Revised Cardiac Risk Index (RCRI).  Therefore, he is at low risk for perioperative complications.   His functional capacity is good at 5.62 METs according to the [place] Activity Status Index (DASI). Recommendations: According to ACC/AHA guidelines, no further cardiovascular testing needed.  The patient may proceed to surgery at acceptable risk.   Antiplatelet and/or Anticoagulation  Recommendations:  Eliquis (Apixaban) can be held for 1 days prior to surgery.  Please resume post op when felt to be safe.     A copy of this note will be routed to requesting surgeon.  Time:   Today, I have spent 10 minutes with the patient with telehealth technology discussing medical history, symptoms, and management plan.     Sharlene Dory, PA-C  11/22/2022, 10:23 AM

## 2022-11-24 ENCOUNTER — Ambulatory Visit
Admission: EM | Admit: 2022-11-24 | Discharge: 2022-11-24 | Disposition: A | Discharge status: Home / Self Care | Payer: BC Managed Care – PPO | Attending: Family Medicine | Admitting: Family Medicine

## 2022-11-24 DIAGNOSIS — T783XXA Angioneurotic edema, initial encounter: Secondary | ICD-10-CM

## 2022-11-24 MED ORDER — DIPHENHYDRAMINE HCL 50 MG PO CAPS
50.0000 mg | ORAL_CAPSULE | Freq: Once | ORAL | Status: AC
  Administered 2022-11-24: 50 mg via ORAL

## 2022-11-24 MED ORDER — PREDNISONE 20 MG PO TABS: 40.0000 mg | ORAL_TABLET | Freq: Every day | ORAL | 0 refills | Status: AC

## 2022-11-24 MED ORDER — FAMOTIDINE 20 MG PO TABS: 20.0000 mg | ORAL_TABLET | Freq: Two times a day (BID) | ORAL | 0 refills | Status: DC

## 2022-11-24 MED ORDER — DEXAMETHASONE SODIUM PHOSPHATE 10 MG/ML IJ SOLN
10.0000 mg | Freq: Once | INTRAMUSCULAR | Status: AC
  Administered 2022-11-24: 10 mg via INTRAMUSCULAR

## 2022-11-24 MED ORDER — FAMOTIDINE 20 MG PO TABS
20.0000 mg | ORAL_TABLET | Freq: Once | ORAL | Status: AC
  Administered 2022-11-24: 20 mg via ORAL

## 2022-11-24 NOTE — Discharge Instructions (Addendum)
We gave you a shot of dexamethasone 10 mg here.  You were also given Benadryl 50 mg orally and famotidine 20 mg orally.  Take prednisone 20 mg--2 daily for 5 days  Take famotidine 20 mg--1 tablet 2 times daily.    Take Benadryl 25 mg-2 tablets every 6 hours for the next 24 hours or as needed for allergies after that. -  Please follow-up with your primary care about this issue as they may want to send you to an allergy specialist

## 2022-11-24 NOTE — ED Provider Notes (Signed)
[facility]    CSN: [id number] Arrival date & time: 11/24/22  1632      History   Chief Complaint Chief Complaint  Patient presents with   Allergic Reaction    HPI [name] is a 62 y.o. male.    Allergic Reaction  Here for sensation that his throat is swelling and throat pain.  The symptoms began about 4 hours ago after he had taken some Tylenol arthritis.  Also just prior to that he had eaten some blueberry and pineapple.  None of these things have never caused him problems before.   No fever or chills. He is allergic to statins that causes him to itch   Past Medical History:  Diagnosis Date   Chest pain    stres test neg x 2, cath 5/12; minimal LAD irregs; no obs CAD; normal LVF   Clotting disorder    dvt   GERD (gastroesophageal reflux disease)    History of DVT of lower extremity    on chronic coumadin   Hypertension    Internal hemorrhoids    Cscope 2007   PAD (peripheral artery disease)    a. left leg ischemia tx wtih embolectomy of left fem, pop, tib arteries with Dr. Edilia Bo - 08/2006;  b. known occlusion of right pop with collats - med Rx;  c.ABI's 8/09: R 1.0; L 0.91    PFO (patent foramen ovale)    per 08/2006 discharge summary, TEE showed EF 60%, small PFO with minimal right-to-left shunt with Valsalva; not felt to be the source of emboli, lifelong Coumadin recommended   Pneumonia    history of   Polysubstance abuse    marijuana only   Renal infarct    R    Patient Active Problem List   Diagnosis Date Noted   High risk heterosexual behavior 02/05/2021   History of colonic polyps 11/04/2019   PCP NOTES >>>>>>>>>>>>>> 07/26/2016   GERD (gastroesophageal reflux disease) 07/26/2016   Annual physical exam 07/25/2016   HTN (hypertension) 09/16/2014   Nausea without vomiting 11/29/2013   Deep vein thrombosis 10/06/2010   Long term current use of anticoagulant 10/06/2010   Renal infarct 10/06/2010   Dyslipidemia 09/03/2009    CHEST PAIN-UNSPECIFIED 09/03/2009   SHOULDER PAIN, RIGHT 03/02/2009   PERIPHERAL VASCULAR DISEASE 02/11/2009   SKIN LESION 02/04/2008   Polysubstance abuse (HCC) 05/28/2007   DISORDER, VASCULAR, KIDNEY 05/28/2007   DVT, HX OF 12/25/2006    Past Surgical History:  Procedure Laterality Date   CHOLECYSTECTOMY     COLONOSCOPY     INGUINAL HERNIA REPAIR Left 07/16/2019   Procedure: LAPAROSCOPIC LEFT INGUINAL HERNIA REPAIR WITH MESH;  Surgeon: Axel Filler, MD;  Location: [facility] OR;  Service: General;  Laterality: Left;   left leg blood clot removal 2009  2009   TONSILLECTOMY     WRIST SURGERY         Home Medications    Prior to Admission medications   Medication Sig Start Date End Date Taking? Authorizing Provider  famotidine (PEPCID) 20 MG tablet Take 1 tablet (20 mg total) by mouth 2 (two) times daily. 11/24/22  Yes Zenia Resides, MD  predniSONE (DELTASONE) 20 MG tablet Take 2 tablets (40 mg total) by mouth daily with breakfast for 5 days. 11/24/22 11/29/22 Yes Zenia Resides, MD  apixaban Everlene Balls) 5 MG TABS tablet Take 1 tablet by mouth twice daily 08/11/22   [name], MD  atorvastatin (LIPITOR) 40 MG tablet Take 1 tablet (  40 mg total) by mouth daily. 05/27/22   [name], MD  esomeprazole (NEXIUM) 20 MG packet Take 20 mg by mouth daily before breakfast.    [provider]  fexofenadine (ALLEGRA) 180 MG tablet Take 180 mg by mouth daily.    [provider]  lisinopril (ZESTRIL) 10 MG tablet Take 1 tablet by mouth once daily 06/22/22   [name], MD  Multiple Vitamin (MULTIVITAMIN) tablet Take 1 tablet by mouth daily.    [provider]    Family History Family History  Problem Relation Age of Onset   Hypertension Mother    Breast cancer Mother    Hypertension Father    CAD Father    Diabetes Father    Lung cancer Maternal Uncle    Pancreatic cancer Brother    Prostate cancer Maternal Uncle    Heart disease Paternal  Grandmother    Colon cancer Neg Hx    Stomach cancer Neg Hx    Esophageal cancer Neg Hx    Rectal cancer Neg Hx     Social History Social History   Tobacco Use   Smoking status: Every Day    Packs/day: 1.00    Years: 30.00    Additional pack years: 0.00    Total pack years: 30.00    Types: Cigarettes   Smokeless tobacco: Never   Tobacco comments:    1 to 1.5 ppd   Vaping Use   Vaping Use: Never used  Substance Use Topics   Alcohol use: Yes    Comment: ETOH most days ," 2- 40 oz beers , and a pint of liquor   Drug use: Yes    Types: Marijuana    Comment: 07/09/19     Allergies   Pravastatin, Simvastatin, and Lisinopril   Review of Systems Review of Systems   Physical Exam Triage Vital Signs ED Triage Vitals  Enc Vitals Group     BP 11/24/22 1641 (!) 148/93     Pulse Rate 11/24/22 1641 (!) 106     Resp 11/24/22 1641 20     Temp 11/24/22 1641 98.4 F (36.9 C)     Temp Source 11/24/22 1641 Oral     SpO2 11/24/22 1641 99 %     Weight --      Height --      Head Circumference --      Peak Flow --      Pain Score 11/24/22 1642 0     Pain Loc --      Pain Edu? --      Excl. in GC? --    No data found.  Updated Vital Signs BP (!) 148/93 (BP Location: Left Arm)   Pulse (!) 106   Temp 98.4 F (36.9 C) (Oral)   Resp 20   SpO2 99%   Visual Acuity Right Eye Distance:   Left Eye Distance:   Bilateral Distance:    Right Eye Near:   Left Eye Near:    Bilateral Near:     Physical Exam Vitals reviewed.  Constitutional:      General: He is not in acute distress.    Appearance: He is not toxic-appearing or diaphoretic.  HENT:     Nose: Nose normal.     Mouth/Throat:     Mouth: Mucous membranes are moist.     Comments: Oropharynx appears benign.  Tongue appears normal.  Lips are not swollen. Eyes:     Extraocular Movements: Extraocular movements intact.  Conjunctiva/sclera: Conjunctivae normal.     Pupils: Pupils are equal, round, and reactive to  light.  Cardiovascular:     Rate and Rhythm: Normal rate and regular rhythm.     Heart sounds: No murmur heard. Pulmonary:     Effort: No respiratory distress.     Breath sounds: No stridor. No wheezing, rhonchi or rales.     Comments: No stridor or wheeze heard.  Air movement is good. Musculoskeletal:     Cervical back: Neck supple.  Lymphadenopathy:     Cervical: No cervical adenopathy.  Skin:    Capillary Refill: Capillary refill takes less than 2 seconds.     Coloration: Skin is not jaundiced or pale.  Neurological:     General: No focal deficit present.     Mental Status: He is alert and oriented to person, place, and time.  Psychiatric:        Behavior: Behavior normal.      UC Treatments / Results  Labs (all labs ordered are listed, but only abnormal results are displayed) Labs Reviewed - No data to display  EKG   Radiology No results found.  Procedures Procedures (including critical care time)  Medications Ordered in UC Medications  dexamethasone (DECADRON) injection 10 mg (10 mg Intramuscular Given 11/24/22 1651)  famotidine (PEPCID) tablet 20 mg (20 mg Oral Given 11/24/22 1651)  diphenhydrAMINE (BENADRYL) capsule 50 mg (50 mg Oral Given 11/24/22 1651)    Initial Impression / Assessment and Plan / UC Course  I have reviewed the triage vital signs and the nursing notes.  Pertinent labs & imaging results that were available during my care of the patient were reviewed by me and considered in my medical decision making (see chart for details).        Decadron, Pepcid, and Benadryl given here. 20 minutes later he feels normal.  Exam is still normal.  Burst of prednisone is sent in and so is some Pepcid.  Advised to take Benadryl as needed.  I have asked him to follow-up with his primary care Final Clinical Impressions(s) / UC Diagnoses   Final diagnoses:  Angioedema, initial encounter     Discharge Instructions      We gave you a shot of  dexamethasone 10 mg here.  You were also given Benadryl 50 mg orally and famotidine 20 mg orally.  Take prednisone 20 mg--2 daily for 5 days  Take famotidine 20 mg--1 tablet 2 times daily.    Take Benadryl 25 mg-2 tablets every 6 hours for the next 24 hours or as needed for allergies after that. -  Please follow-up with your primary care about this issue as they may want to send you to an allergy specialist     ED Prescriptions     Medication Sig Dispense Auth. Provider   famotidine (PEPCID) 20 MG tablet Take 1 tablet (20 mg total) by mouth 2 (two) times daily. 30 tablet Rashi Granier, Janace Aris, MD   predniSONE (DELTASONE) 20 MG tablet Take 2 tablets (40 mg total) by mouth daily with breakfast for 5 days. 10 tablet Marlinda Mike Janace Aris, MD      PDMP not reviewed this encounter.   Zenia Resides, MD 11/24/22 [phone]

## 2022-11-24 NOTE — ED Triage Notes (Signed)
Patient states he ate blueberry, pineapple, and applesauce, and then took 2 tylenol arthritis for hip pain. After that felt like a pill was stuck in his throat and tried to throw it up but nothing ever came up. Denies rx to any of the above prior to today.

## 2022-12-05 ENCOUNTER — Telehealth: Payer: Self-pay | Admitting: Surgical

## 2022-12-05 NOTE — Telephone Encounter (Signed)
Pt called requesting pain medication for left hip. Pt had an injection last month but injection has worsen off 1 month. Please send medication to [address] Pt phone number [phone]. Pt keep appt just incase Luke want to see him but pt states if he dont need to be seen and can take meds until next plan of action.

## 2022-12-09 ENCOUNTER — Ambulatory Visit: Payer: BC Managed Care – PPO | Admitting: Surgical

## 2022-12-09 DIAGNOSIS — M1612 Unilateral primary osteoarthritis, left hip: Secondary | ICD-10-CM | POA: Diagnosis not present

## 2022-12-09 MED ORDER — ACETAMINOPHEN-CODEINE 300-30 MG PO TABS: 1.0000 | ORAL_TABLET | Freq: Two times a day (BID) | ORAL | 0 refills | Status: DC | PRN

## 2022-12-09 MED ORDER — DIAZEPAM 5 MG PO TABS: ORAL_TABLET | ORAL | 0 refills | Status: DC

## 2022-12-10 ENCOUNTER — Encounter: Payer: Self-pay | Admitting: Surgical

## 2022-12-10 NOTE — Progress Notes (Signed)
Follow-up Office Visit Note   Patient: [name]           Date of Birth: [birth year]           MRN: [id number] Visit Date: 12/09/2022 Requested by: Wanda Plump, MD [address] [place],  [place] [address] PCP: Wanda Plump, MD  Subjective: Chief Complaint  Patient presents with   Left Hip - Pain    HPI: [name] is a 62 y.o. male who returns to the office for follow-up visit.    Plan at last visit was: Patient is a 62 year old male who presents for evaluation of left hip pain. Localizes pain to the groin. Has been ongoing for several years but severely worsening over the last several weeks to the point where it is disturbing his sleep. He is reproduced with hip range of motion. He has radiographs ordered by his PCP Dr. Drue Novel that demonstrate mild degenerative changes of the left hip joint. Suspect that he has more advanced arthritis than radiographs demonstrate. Ultrasound probe was placed over the left hip demonstrating hip joint effusion. We discussed the options available to patient including living with his symptoms versus further imaging of the left hip with MRI scan versus diagnostic/therapeutic left hip joint injection versus working with physical therapy. He states that he has missed about 1 week of work due to this hip pain and he really wants to return to work on Monday so he would like to try the injection to see if this will calm down pain over the weekend to the point where he can get back to work. Under ultrasound guidance, cortisone injection was successfully delivered into the left hip joint. Color Doppler was utilized to avoid any vascular structures. He tolerated procedure well. Did have fairly substantial relief of symptoms after 5 to 10 minutes following the injection. Follow-up in 4 to 6 weeks if he is having continued symptoms. Call with any concerns in the meantime.   Since then, patient notes he had pretty much 95% relief of his left groin  pain for 3 to 4 weeks following injection.  After that, symptoms returned pretty much back to how they were prior to injection.  He has been walking with a substantial limp that is worst when he first gets up from a sitting position and improves as he ambulates further.  Pain will wake him up at night and he tosses and turns due to the pain.  Taking Tylenol without any relief.  Cannot take anti-inflammatories due to being on Eliquis.  No new injury.              ROS: All systems reviewed are negative as they relate to the chief complaint within the history of present illness.  Patient denies fevers or chills.  Assessment & Plan: Visit Diagnoses:  1. Unilateral primary osteoarthritis, left hip     Plan: [name] is a 62 y.o. male who returns to the office for follow-up visit for left hip pain.  Plan from last visit was noted above in HPI.  They now return with significant relief of left hip groin pain from intra-articular hip injection under ultrasound at last visit.  This only lasted for about 3 to 4 weeks.  He has mild degenerative changes on radiographs but with the severity of his limping and his nighttime pain, suspect that he likely has worse osteoarthritis than is apparent by x-ray.  The other thought would be that  he has some sort of intra-articular stress fracture but think this is less likely especially with pain that improves as he ambulates and the overall long nature of his pain that has been steadily worsening over the last 7 years.  Plan to order MRI of the left hip to evaluate for occult severe arthritis versus stress fracture.  We did discuss that he may be heading for eventual hip replacement based on the severity of his pain.  Follow-Up Instructions: No follow-ups on file.   Orders:  Orders Placed This Encounter  Procedures   MR Hip Left w/o contrast   Meds ordered this encounter  Medications   acetaminophen-codeine (TYLENOL #3) 300-30 MG tablet    Sig: Take 1 tablet  by mouth every 12 (twelve) hours as needed for moderate pain.    Dispense:  30 tablet    Refill:  0   diazepam (VALIUM) 5 MG tablet    Sig: Take 1 tablet 30 to 60 minutes prior to MRI scan.  Do not operate motor vehicle while taking this medication.    Dispense:  2 tablet    Refill:  0      Procedures: No procedures performed   Clinical Data: No additional findings.  Objective: Vital Signs: There were no vitals taken for this visit.  Physical Exam:  Constitutional: Patient appears well-developed HEENT:  Head: Normocephalic Eyes:EOM are normal Neck: Normal range of motion Cardiovascular: Normal rate Pulmonary/chest: Effort normal Neurologic: Patient is alert Skin: Skin is warm Psychiatric: Patient has normal mood and affect  Ortho Exam: Ortho exam demonstrates left hip with intact hip flexion actively with strength rated 5/5.  He has increased pain with passive hip motion, primarily with flexion and internal rotation.  Positive Stinchfield sign.  This reproduces pain in the groin with radiation down the anterior thigh that extends to the knee.  He ambulates with significant antalgia with Trendelenburg component that is very noticeable and prominent when he first stands up and improves if moderate amount after he walks for about 20 feet but still is evident.  Specialty Comments:  No specialty comments available.  Imaging: No results found.   PMFS History: Patient Active Problem List   Diagnosis Date Noted   High risk heterosexual behavior 02/05/2021   History of colonic polyps 11/04/2019   PCP NOTES >>>>>>>>>>>>>> 07/26/2016   GERD (gastroesophageal reflux disease) 07/26/2016   Annual physical exam 07/25/2016   HTN (hypertension) 09/16/2014   Nausea without vomiting 11/29/2013   Deep vein thrombosis (HCC) 10/06/2010   Long term current use of anticoagulant 10/06/2010   Renal infarct (HCC) 10/06/2010   Dyslipidemia 09/03/2009   CHEST PAIN-UNSPECIFIED 09/03/2009    SHOULDER PAIN, RIGHT 03/02/2009   PERIPHERAL VASCULAR DISEASE 02/11/2009   SKIN LESION 02/04/2008   Polysubstance abuse (HCC) 05/28/2007   DISORDER, VASCULAR, KIDNEY 05/28/2007   DVT, HX OF 12/25/2006   Past Medical History:  Diagnosis Date   Chest pain    stres test neg x 2, cath 5/12; minimal LAD irregs; no obs CAD; normal LVF   Clotting disorder (HCC)    dvt   GERD (gastroesophageal reflux disease)    History of DVT of lower extremity    on chronic coumadin   Hypertension    Internal hemorrhoids    Cscope 2007   PAD (peripheral artery disease) (HCC)    a. left leg ischemia tx wtih embolectomy of left fem, pop, tib arteries with Dr. Edilia Bo - 08/2006;  b. known occlusion of right pop with  collats - med Rx;  c.ABI's 8/09: R 1.0; L 0.91    PFO (patent foramen ovale)    per 08/2006 discharge summary, TEE showed EF 60%, small PFO with minimal right-to-left shunt with Valsalva; not felt to be the source of emboli, lifelong Coumadin recommended   Pneumonia    history of   Polysubstance abuse (HCC)    marijuana only   Renal infarct (HCC)    R    Family History  Problem Relation Age of Onset   Hypertension Mother    Breast cancer Mother    Hypertension Father    CAD Father    Diabetes Father    Lung cancer Maternal Uncle    Pancreatic cancer Brother    Prostate cancer Maternal Uncle    Heart disease Paternal Grandmother    Colon cancer Neg Hx    Stomach cancer Neg Hx    Esophageal cancer Neg Hx    Rectal cancer Neg Hx     Past Surgical History:  Procedure Laterality Date   CHOLECYSTECTOMY     COLONOSCOPY     INGUINAL HERNIA REPAIR Left 07/16/2019   Procedure: LAPAROSCOPIC LEFT INGUINAL HERNIA REPAIR WITH MESH;  Surgeon: Axel Filler, MD;  Location: [facility] OR;  Service: General;  Laterality: Left;   left leg blood clot removal 2009  2009   TONSILLECTOMY     WRIST SURGERY     Social History   Occupational History   Occupation: Futures trader, driver   Tobacco Use   Smoking  status: Every Day    Packs/day: 1.00    Years: 30.00    Additional pack years: 0.00    Total pack years: 30.00    Types: Cigarettes   Smokeless tobacco: Never   Tobacco comments:    1 to 1.5 ppd   Vaping Use   Vaping Use: Never used  Substance and Sexual Activity   Alcohol use: Yes    Comment: ETOH most days ," 2- 40 oz beers , and a pint of liquor   Drug use: Yes    Types: Marijuana    Comment: 07/09/19   Sexual activity: Yes    Partners: Female

## 2022-12-14 ENCOUNTER — Emergency Department (HOSPITAL_COMMUNITY)
Admission: EM | Admit: 2022-12-14 | Discharge: 2022-12-14 | Disposition: A | Discharge status: Home / Self Care | Payer: BC Managed Care – PPO | Attending: Emergency Medicine | Admitting: Emergency Medicine

## 2022-12-14 ENCOUNTER — Other Ambulatory Visit: Payer: Self-pay

## 2022-12-14 ENCOUNTER — Emergency Department (HOSPITAL_COMMUNITY): Payer: BC Managed Care – PPO

## 2022-12-14 DIAGNOSIS — M8788 Other osteonecrosis, other site: Secondary | ICD-10-CM | POA: Insufficient documentation

## 2022-12-14 DIAGNOSIS — Z7901 Long term (current) use of anticoagulants: Secondary | ICD-10-CM | POA: Insufficient documentation

## 2022-12-14 DIAGNOSIS — M25552 Pain in left hip: Secondary | ICD-10-CM

## 2022-12-14 DIAGNOSIS — M87052 Idiopathic aseptic necrosis of left femur: Secondary | ICD-10-CM

## 2022-12-14 MED ORDER — MORPHINE SULFATE (PF) 4 MG/ML IV SOLN
8.0000 mg | Freq: Once | INTRAVENOUS | Status: AC
  Administered 2022-12-14: 8 mg via INTRAMUSCULAR
  Filled 2022-12-14: qty 2

## 2022-12-14 MED ORDER — OXYCODONE-ACETAMINOPHEN 5-325 MG PO TABS: 1.0000 | ORAL_TABLET | Freq: Four times a day (QID) | ORAL | 0 refills | Status: DC | PRN

## 2022-12-14 MED ORDER — LACTATED RINGERS IV SOLN: INTRAVENOUS | Status: DC

## 2022-12-14 MED ORDER — METHOCARBAMOL 500 MG PO TABS: 500.0000 mg | ORAL_TABLET | Freq: Two times a day (BID) | ORAL | 0 refills | Status: DC

## 2022-12-14 NOTE — ED Triage Notes (Signed)
Pt arrived via POV. C/o chronic L hip pain. Pt has been under care of their MD for this. Has MRI scheduled on 5/19. Pt's pain has not improved after cortisone shot.  AOx4

## 2022-12-14 NOTE — ED Notes (Signed)
Pt took prescribed 5mg  diazepam prior to MRI. MD aware/approved.

## 2022-12-14 NOTE — ED Provider Notes (Signed)
[name] EMERGENCY DEPARTMENT AT [facility] Provider Note   CSN: [id number] Arrival date & time: 12/14/22  4098     History  Chief Complaint  Patient presents with   Hip Pain    [name] is a 62 y.o. male.  62 year old male presents with longstanding history of left hip pain.  Denies any new history of trauma.  Patient scheduled for MRI in 11 days.  Recent received a cortisone shot to his left hip.  States has been unrelieved with taking Tylenol 3.  No fever or chills.  Presents for further evaluation       Home Medications Prior to Admission medications   Medication Sig Start Date End Date Taking? Authorizing Provider  acetaminophen-codeine (TYLENOL #3) 300-30 MG tablet Take 1 tablet by mouth every 12 (twelve) hours as needed for moderate pain. 12/09/22   Magnant, Joycie Peek, PA-C  apixaban (ELIQUIS) 5 MG TABS tablet Take 1 tablet by mouth twice daily 08/11/22   [name], MD  atorvastatin (LIPITOR) 40 MG tablet Take 1 tablet (40 mg total) by mouth daily. 05/27/22   [name], MD  diazepam (VALIUM) 5 MG tablet Take 1 tablet 30 to 60 minutes prior to MRI scan.  Do not operate motor vehicle while taking this medication. 12/09/22   Magnant, Charles L, PA-C  esomeprazole (NEXIUM) 20 MG packet Take 20 mg by mouth daily before breakfast.    [provider]  famotidine (PEPCID) 20 MG tablet Take 1 tablet (20 mg total) by mouth 2 (two) times daily. 11/24/22   Zenia Resides, MD  fexofenadine (ALLEGRA) 180 MG tablet Take 180 mg by mouth daily.    [provider]  lisinopril (ZESTRIL) 10 MG tablet Take 1 tablet by mouth once daily 06/22/22   [name], MD  Multiple Vitamin (MULTIVITAMIN) tablet Take 1 tablet by mouth daily.    [provider]      Allergies    Pravastatin, Simvastatin, and Lisinopril    Review of Systems   Review of Systems  All other systems reviewed and are negative.   Physical Exam Updated Vital  Signs BP (!) 137/103 (BP Location: Left Arm)   Pulse (!) 107   Temp 98.9 F (37.2 C) (Oral)   Resp 19   Ht 1.829 m (6')   Wt 81.6 kg   SpO2 100%   BMI 24.41 kg/m  Physical Exam Vitals and nursing note reviewed.  Constitutional:      General: He is not in acute distress.    Appearance: Normal appearance. He is well-developed. He is not toxic-appearing.  HENT:     Head: Normocephalic and atraumatic.  Eyes:     General: Lids are normal.     Conjunctiva/sclera: Conjunctivae normal.     Pupils: Pupils are equal, round, and reactive to light.  Neck:     Thyroid: No thyroid mass.     Trachea: No tracheal deviation.  Cardiovascular:     Rate and Rhythm: Normal rate and regular rhythm.     Heart sounds: Normal heart sounds. No murmur heard.    No gallop.  Pulmonary:     Effort: Pulmonary effort is normal. No respiratory distress.     Breath sounds: Normal breath sounds. No stridor. No decreased breath sounds, wheezing, rhonchi or rales.  Abdominal:     General: There is no distension.     Palpations: Abdomen is soft.     Tenderness: There is no abdominal tenderness. There is  no rebound.  Musculoskeletal:        General: No tenderness. Normal range of motion.     Cervical back: Normal range of motion and neck supple.     Comments: Pain with internal/external rotation of left lower extremity.  Patient's gait is limited by pain to his left hip.  He has normal plantar and dorsiflexion.  No shortening or rotation noted  Skin:    General: Skin is warm and dry.     Findings: No abrasion or rash.  Neurological:     Mental Status: He is alert and oriented to person, place, and time. Mental status is at baseline.     GCS: GCS eye subscore is 4. GCS verbal subscore is 5. GCS motor subscore is 6.     Cranial Nerves: No cranial nerve deficit.     Sensory: No sensory deficit.     Motor: Motor function is intact.  Psychiatric:        Attention and Perception: Attention normal.         Speech: Speech normal.        Behavior: Behavior normal.     ED Results / Procedures / Treatments   Labs (all labs ordered are listed, but only abnormal results are displayed) Labs Reviewed - No data to display  EKG None  Radiology No results found.  Procedures Procedures    Medications Ordered in ED Medications  morphine (PF) 4 MG/ML injection 8 mg (has no administration in time range)    ED Course/ Medical Decision Making/ A&P                             Medical Decision Making Amount and/or Complexity of Data Reviewed Radiology: ordered.  Risk Prescription drug management.   Patient medicated for pain here and feels better.  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  Discussed with patient's orthopedic provider and patient be seen this week.  Will place on crutches and give pain medications        Final Clinical Impression(s) / ED Diagnoses Final diagnoses:  None    Rx / DC Orders ED Discharge Orders     None         Lorre Nick, MD 12/14/22 1615

## 2022-12-14 NOTE — Discharge Instructions (Signed)
Follow up with Dr. Dean.

## 2022-12-15 ENCOUNTER — Other Ambulatory Visit: Payer: BC Managed Care – PPO

## 2022-12-16 ENCOUNTER — Telehealth: Payer: Self-pay | Admitting: Orthopedic Surgery

## 2022-12-16 ENCOUNTER — Telehealth: Payer: Self-pay | Admitting: Internal Medicine

## 2022-12-16 NOTE — Telephone Encounter (Signed)
Agree, Pt needs appt- he has been to ER twice.

## 2022-12-16 NOTE — Telephone Encounter (Signed)
Pt dropped off FMLA paperwork to be filled out by provider Conservation officer, [employer] 7 pages) Pt stated PCP knows about his condition (pt was informed possibly will need appt to fill out documents- pt did not want to schedule but stated is needing documents ASAP to be faxed to [phone]. Document put at front office tray.

## 2022-12-16 NOTE — Telephone Encounter (Signed)
Patient called. Needs a note to be out of work for more days. His call back number is [phone]

## 2022-12-16 NOTE — Telephone Encounter (Signed)
Called pt and informed the below, pt schedule an appt for 12-19-2022. Done

## 2022-12-16 NOTE — Telephone Encounter (Signed)
Note sent to mychart. Tried calling pt to advise. No answer and voice mail was full

## 2022-12-16 NOTE — Telephone Encounter (Signed)
Okay for this, oow for 2 weeks

## 2022-12-19 ENCOUNTER — Encounter: Payer: Self-pay | Admitting: Internal Medicine

## 2022-12-19 ENCOUNTER — Ambulatory Visit (INDEPENDENT_AMBULATORY_CARE_PROVIDER_SITE_OTHER): Payer: BC Managed Care – PPO | Admitting: Internal Medicine

## 2022-12-19 VITALS — BP 144/88 | HR 108 | Temp 98.7°F | Resp 18 | Ht 72.0 in | Wt 170.4 lb

## 2022-12-19 DIAGNOSIS — M87059 Idiopathic aseptic necrosis of unspecified femur: Secondary | ICD-10-CM

## 2022-12-19 DIAGNOSIS — T783XXA Angioneurotic edema, initial encounter: Secondary | ICD-10-CM

## 2022-12-19 MED ORDER — EPINEPHRINE 0.3 MG/0.3ML IJ SOAJ: 0.3000 mg | INTRAMUSCULAR | 1 refills | Status: DC | PRN

## 2022-12-19 MED ORDER — AMLODIPINE BESYLATE 5 MG PO TABS
5.0000 mg | ORAL_TABLET | Freq: Every day | ORAL | 6 refills | Status: DC
Start: 2022-12-19 — End: 2023-07-20

## 2022-12-19 MED ORDER — FAMOTIDINE 40 MG PO TABS
40.0000 mg | ORAL_TABLET | Freq: Every day | ORAL | 1 refills | Status: DC
Start: 2022-12-19 — End: 2023-07-19

## 2022-12-19 NOTE — Assessment & Plan Note (Signed)
Bilateral hip pain: d/t B avascular necrosis.  Recommend to follow-up with Ortho. FMLA paperwork completed, from May 8 to May 27.   Angioedema: On chronic lisinopril. Took Tylenol with codeine up to 12/14/2022 Started methocarbamol and oxycodone 12/14/2022. Suspected culprit is lisinopril, will stop it, recommend Allegra, Pepcid and provided a EpiPen rx. If symptoms resurface use EpiPen, go to the ER.  He will need further evaluation if that is the case.  This was carefully explained to the patient and his daughter. HTN: Stop lisinopril, start amlodipine, check ambulatory BPs. R hernia: Noted on MRI and confirm by physical exam Right sebaceous cyst, near the scrotum: Seen on MRI , confirmed by physical exam RTC 2 months.

## 2022-12-19 NOTE — Progress Notes (Signed)
Subjective:    Patient ID: [name], male    DOB: Jan 10, 1961, 62 y.o.   MRN: [id number]  DOS:  12/19/2022 Type of visit - description: FMLA paperwork, lip swelling.  At the last visit, C/O L hip pain, saw orthopedics 12/09/2022, MRI ordered  but eventually  went to the ER 12/14/2022 with severe pain, MRI of both  Hips showed more than 50% involvement with avascular necrosis. MRI also showed a R inguinal hernia   Is seen at the ER 11/24/2022, had a sensation of throat swelling and pain. Exam was benign, was treated with steroids, Benadryl.  The patient felt well afterwards.  Reports that yesterday he developed a upper lip swelling that still persists.  No generalized itching, no throat itching or pain, no shortness of breath, no wheezzing.   Review of Systems See above   Past Medical History:  Diagnosis Date   Chest pain    stres test neg x 2, cath 5/12; minimal LAD irregs; no obs CAD; normal LVF   Clotting disorder (HCC)    dvt   GERD (gastroesophageal reflux disease)    History of DVT of lower extremity    on chronic coumadin   Hypertension    Internal hemorrhoids    Cscope 2007   PAD (peripheral artery disease) (HCC)    a. left leg ischemia tx wtih embolectomy of left fem, pop, tib arteries with Dr. Edilia Bo - 08/2006;  b. known occlusion of right pop with collats - med Rx;  c.ABI's 8/09: R 1.0; L 0.91    PFO (patent foramen ovale)    per 08/2006 discharge summary, TEE showed EF 60%, small PFO with minimal right-to-left shunt with Valsalva; not felt to be the source of emboli, lifelong Coumadin recommended   Pneumonia    history of   Polysubstance abuse (HCC)    marijuana only   Renal infarct (HCC)    R    Past Surgical History:  Procedure Laterality Date   CHOLECYSTECTOMY     COLONOSCOPY     INGUINAL HERNIA REPAIR Left 07/16/2019   Procedure: LAPAROSCOPIC LEFT INGUINAL HERNIA REPAIR WITH MESH;  Surgeon: Axel Filler, MD;  Location: [facility] OR;  Service: General;   Laterality: Left;   left leg blood clot removal 2009  2009   TONSILLECTOMY     WRIST SURGERY      Current Outpatient Medications  Medication Instructions   acetaminophen-codeine (TYLENOL #3) 300-30 MG tablet 1 tablet, Oral, Every 12 hours PRN   apixaban (ELIQUIS) 5 MG TABS tablet Take 1 tablet by mouth twice daily   atorvastatin (LIPITOR) 40 mg, Oral, Daily   diazepam (VALIUM) 5 MG tablet Take 1 tablet 30 to 60 minutes prior to MRI scan.  Do not operate motor vehicle while taking this medication.   esomeprazole (NEXIUM) 20 mg, Oral, Daily before breakfast   famotidine (PEPCID) 20 mg, Oral, 2 times daily   fexofenadine (ALLEGRA) 180 mg, Daily   lisinopril (ZESTRIL) 10 mg, Oral, Daily   methocarbamol (ROBAXIN) 500 mg, Oral, 2 times daily   Multiple Vitamin (MULTIVITAMIN) tablet 1 tablet, Oral, Daily   oxyCODONE-acetaminophen (PERCOCET/ROXICET) 5-325 MG tablet 1 tablet, Oral, Every 6 hours PRN       Objective:   Physical Exam BP (!) 144/88   Pulse (!) 108   Temp 98.7 F (37.1 C) (Oral)   Resp 18   Ht 6' (1.829 m)   Wt 170 lb 6 oz (77.3 kg)   SpO2 99%  BMI 23.11 kg/m  General:   Well developed, NAD, BMI noted. HEENT:  Normocephalic . Face symmetric, atraumatic. Upper lip slightly swollen.  Lower lip normal.  Tongue, throat and gums: Normal.  No stridor. Lungs:  CTA B Normal respiratory effort, no intercostal retractions, no accessory muscle use. Heart: RRR,  no murmur. Groins: Left normal Right: Small inguinal hernia.  Has a palpable external round mass, unchanged from 10/24/2022 Lower extremities: no pretibial edema bilaterally  Skin: Not pale. Not jaundice Neurologic:  alert & oriented X3.  Speech normal, gait appropriate for age and unassisted Psych--  Cognition and judgment appear intact.  Cooperative with normal attention span and concentration.  Behavior appropriate. No anxious or depressed appearing.      Assessment   Assessment HTN Hyperlipidemia-  statin intolerant  GERD -EGD 2002 and 2015 (due to nausea) : Gastritis, duodenal inflammation., bx benign, no H. pylori Chest pain:  stres test neg x 2, cath 5/12; minimal LAD irregs; no obs CAD; normal LVF; stress test: 01/16/2014: Low risk study Vascular, + Hypercoagulable state -DVT, leg on chronic Coumadin (per cardiology) PAD:    -2008: L leg ischemia >> embolectomy of left fem, pop, tib arteries ( Dr. Edilia Bo ) -known occlusion of right pop with collats - med Rx - R renal infarct Polysubstance abuse Smoker    PLAN: Bilateral hip pain: d/t B avascular necrosis.  Recommend to follow-up with Ortho. FMLA paperwork completed, from May 8 to May 27.   Angioedema: On chronic lisinopril. Took Tylenol with codeine up to 12/14/2022 Started methocarbamol and oxycodone 12/14/2022. Suspected culprit is lisinopril, will stop it, recommend Allegra, Pepcid and provided a EpiPen rx. If symptoms resurface use EpiPen, go to the ER.  He will need further evaluation if that is the case.  This was carefully explained to the patient and his daughter. HTN: Stop lisinopril, start amlodipine, check ambulatory BPs. R hernia: Noted on MRI and confirm by physical exam Right sebaceous cyst, near the scrotum: Seen on MRI , confirmed by physical exam RTC 2 months.

## 2022-12-19 NOTE — Patient Instructions (Addendum)
Lip swelling is called angioedema most likely is due to lisinopril.  Stop lisinopril  Start amlodipine for blood pressure control.  For the next month take:  - Allegra 60 mg 1 tablet twice daily (over-the-counter) - Also Pepcid 40 mg 1 tablet daily (prescription sent) - Carry an EpiPen, if you develop lip swelling, throat swelling, difficulty breathing: Use the EpiPen and go to the ER or call 911.   If you continue having episodes of lip swelling call immediately.   Check the  blood pressure regularly BP GOAL is between 110/65 and  135/85. If it is consistently higher or lower, let me know     GO TO THE FRONT DESK, PLEASE SCHEDULE YOUR APPOINTMENTS Come back for a physical exam in 2 months    Angioedema Angioedema is swelling in the body. The swelling can occur in any part of the body. It can affect any part of the body, including the legs, hands, genitals, face, mouth, lips, and organs. It may cause itchy, red, swollen areas of skin (hives) to form. This condition may: Happen only one time. Happen more than one time. It can also stop at any time. Keep coming back for a number of years. Someday it may stop. What are the causes? This condition may be caused by: Foods, such as milk, eggs, shellfish, wheat, or nuts. Medicines, such as ACE inhibitors, antibiotics, birth control pills, dyes used in X-rays or NSAIDs such as ibuprofen. Hereditary angioedema (HAE) is passed from parent to child. Symptoms can occur because of: Illness. Infection. Stress. Changes in hormones. Exercise. Minor surgery. Dental work. In some cases, the cause of this condition may not be known. What increases the risk? You are more likely to have HAE if you have family members with this condition. What are the signs or symptoms? Symptoms of this condition include: Swollen skin. Itchy, red, swollen areas of skin. Pain, pressure, or tenderness in the affected area. Swollen eyelids, face, lips, or  tongue. Trouble drinking, swallowing, or fully closing the mouth. Being hoarse or having a sore throat. Wheezing. Trouble breathing. If your organs are affected, you may: Feel like vomiting. Have pain in your belly (abdomen). Vomit or have watery poop (diarrhea). Have trouble swallowing. Have trouble peeing. How is this treated? To treat this condition, you may be told: To avoid things that cause attacks (triggers). These include foods or things that cause allergies. To stop medicines that cause the condition. To take medicines to treat the condition. In very bad cases, a breathing tube or a machine that helps with breathing (ventilator) may be used. Follow these instructions at home:  Take all medicines only as told by your doctor. If you were given medicines to treat allergies, always carry them with you. Wear a medical bracelet as told by your doctor. Avoid the things that cause attacks. These may include: Foods. Things in your environment (such as pollen). Stress. Exercise. Avoid all medicines that caused the attacks. Talk to your doctor before you have kids. Some types of this condition may be passed from parent to child. Where to find more information American Academy of Allergy Asthma & Immunology: www.aaaai.org Contact a doctor if: You have another attack. Your attacks happen more often, even after you take steps to prevent them. Your attacks are worse every time they occur. You are thinking about having kids. Get help right away if: Your mouth, tongue, or lips get very swollen. Your swelling gets worse. You have trouble breathing or swallowing. You have  trouble talking. You have chest pain. You feel dizzy. You feel light-headed. You faint. These symptoms may be an emergency. Get help right away. Call your local emergency services (911 in the [place] Do not wait to see if the symptoms will go away. Do not drive yourself to the hospital. Summary Angioedema is  swelling in the body. Angioedema can be caused by the food you eat or the medicines you take. Avoid the things that cause your attacks. These can be food, medicines, or things in your environment. If you were given medicines for allergies, always carry them with you. Get help right away if your mouth, tongue, or lips get swollen. Also, get help right away if you have trouble breathing or swallowing. This information is not intended to replace advice given to you by your health care provider. Make sure you discuss any questions you have with your health care provider. Document Revised: 11/25/2020 Document Reviewed: 11/25/2020 Elsevier Patient Education  2023 [employer].

## 2022-12-19 NOTE — Telephone Encounter (Signed)
Paperwork completed during OV today. Dated 12/14/22 to 01/02/23. Original copy of paperwork given back to Pt, copy sent for scanning. Paperwork faxed to Attn: Hale Drone at [phone].

## 2022-12-19 NOTE — Telephone Encounter (Signed)
Received fax confirmation

## 2022-12-20 ENCOUNTER — Telehealth: Payer: Self-pay | Admitting: Orthopedic Surgery

## 2022-12-20 ENCOUNTER — Other Ambulatory Visit: Payer: Self-pay | Admitting: Surgical

## 2022-12-20 MED ORDER — OXYCODONE-ACETAMINOPHEN 5-325 MG PO TABS: 1.0000 | ORAL_TABLET | Freq: Four times a day (QID) | ORAL | 0 refills | Status: DC | PRN

## 2022-12-20 NOTE — Telephone Encounter (Signed)
Sent in this evening.

## 2022-12-20 NOTE — Telephone Encounter (Signed)
Patient called advised he only have one tab left of the Oxycodone. Patient  asked if he can get a refill on the Rx. The number to contact patient is [phone]

## 2022-12-21 NOTE — Telephone Encounter (Signed)
I called patient and advised. 

## 2022-12-23 ENCOUNTER — Ambulatory Visit: Payer: BC Managed Care – PPO | Admitting: Orthopaedic Surgery

## 2022-12-23 ENCOUNTER — Encounter: Payer: Self-pay | Admitting: Orthopedic Surgery

## 2022-12-23 VITALS — Ht 73.0 in | Wt 180.0 lb

## 2022-12-23 DIAGNOSIS — M87052 Idiopathic aseptic necrosis of left femur: Secondary | ICD-10-CM

## 2022-12-23 NOTE — Progress Notes (Signed)
Office Visit Note   Patient: [name]           Date of Birth: July 12, 1961           MRN: [id number] Visit Date: 12/23/2022              Requested by: Wanda Plump, MD [address] [place] [place],  [place] [address] PCP: Wanda Plump, MD   Assessment & Plan: Visit Diagnoses:  1. Avascular necrosis of bone of left hip (HCC)     Plan: Impression is 62 year old gentleman with left hip avascular necrosis likely due to history of alcoholism.[name]  He has AVN in the right hip as well but the left hip is more symptomatic.  Steroid injection on 10/28/2022 was partially effective.  He has had to miss work due to the severe pain.  At this point based on his options he has elected to move forward with scheduling for left total hip replacement.  Risk benefits prognosis reviewed.  Hip replacement handout was provided.  We will need to wait the necessary 90 days due to the steroid injection.  Impression is severe left hip degenerative joint disease secondary to Avascular necrosis.  Imaging shows bone on bone joint space narrowing.  At this point, conservative treatments fail to provide any significant relief and the pain is severely affecting ADLs and quality of life.  Based on treatment options, the patient has elected to move forward with a hip replacement.  We have discussed the surgical risks that include but are not limited to infection, DVT, leg length discrepancy, numbness, tingling, incomplete relief of pain.  Recovery and prognosis were also reviewed.    Current anticoagulants: Eliquis (apixaban) daily Postop anticoagulation: Eliquis Diabetic: No  Prior DVT/PE: Yes on eliquis Tobacco use: Yes Clearances needed for surgery: PCP - [name]; Cardiology - [name] Anticipate discharge dispo: home   Follow-Up Instructions: No follow-ups on file.   Orders:  No orders of the defined types were placed in this encounter.  No orders of the defined types were placed in this  encounter.     Procedures: No procedures performed   Clinical Data: No additional findings.   Subjective: Chief Complaint  Patient presents with   Left Hip - Pain    MRI review   Right Hip - Pain    MRI review    HPI Patient is a 62 year old gentleman referral from [name] for surgical consultation left hip avascular necrosis.  He also has right hip avascular necrosis.  This was found on MRI earlier this month.  He had a steroid injection in his left hip joint under ultrasound guidance on 10/28/2022 by [name].  This provided some relief but not significantly.  He comes in today for discussion of options.  Patient does have history of alcoholism.    Review of Systems   Objective: Vital Signs: Ht 6\' 1"  (1.854 m)   Wt 180 lb (81.6 kg)   BMI 23.75 kg/m   Physical Exam  Ortho Exam Left hip exam shows antalgic gait and limping with positive Stinchfield sign.  He has pain in the hip joint with movement of the hip. Specialty Comments:  No specialty comments available.  Imaging: No results found.   PMFS History: Patient Active Problem List   Diagnosis Date Noted   Avascular necrosis of bone of left hip (HCC) 12/23/2022   High risk heterosexual behavior 02/05/2021   History of colonic polyps 11/04/2019   PCP  NOTES >>>>>>>>>>>>>> 07/26/2016   GERD (gastroesophageal reflux disease) 07/26/2016   Annual physical exam 07/25/2016   HTN (hypertension) 09/16/2014   Nausea without vomiting 11/29/2013   Deep vein thrombosis (HCC) 10/06/2010   Long term current use of anticoagulant 10/06/2010   Renal infarct (HCC) 10/06/2010   Dyslipidemia 09/03/2009   CHEST PAIN-UNSPECIFIED 09/03/2009   SHOULDER PAIN, RIGHT 03/02/2009   PERIPHERAL VASCULAR DISEASE 02/11/2009   SKIN LESION 02/04/2008   Polysubstance abuse (HCC) 05/28/2007   DISORDER, VASCULAR, KIDNEY 05/28/2007   DVT, HX OF 12/25/2006   Past Medical History:  Diagnosis Date   Chest pain    stres test neg x 2, cath 5/12;  minimal LAD irregs; no obs CAD; normal LVF   Clotting disorder (HCC)    dvt   GERD (gastroesophageal reflux disease)    History of DVT of lower extremity    on chronic coumadin   Hypertension    Internal hemorrhoids    Cscope 2007   PAD (peripheral artery disease) (HCC)    a. left leg ischemia tx wtih embolectomy of left fem, pop, tib arteries with Dr. Edilia Bo - 08/2006;  b. known occlusion of right pop with collats - med Rx;  c.ABI's 8/09: R 1.0; L 0.91    PFO (patent foramen ovale)    per 08/2006 discharge summary, TEE showed EF 60%, small PFO with minimal right-to-left shunt with Valsalva; not felt to be the source of emboli, lifelong Coumadin recommended   Pneumonia    history of   Polysubstance abuse (HCC)    marijuana only   Renal infarct (HCC)    R    Family History  Problem Relation Age of Onset   Hypertension Mother    Breast cancer Mother    Hypertension Father    CAD Father    Diabetes Father    Lung cancer Maternal Uncle    Pancreatic cancer Brother    Prostate cancer Maternal Uncle    Heart disease Paternal Grandmother    Colon cancer Neg Hx    Stomach cancer Neg Hx    Esophageal cancer Neg Hx    Rectal cancer Neg Hx     Past Surgical History:  Procedure Laterality Date   CHOLECYSTECTOMY     COLONOSCOPY     INGUINAL HERNIA REPAIR Left 07/16/2019   Procedure: LAPAROSCOPIC LEFT INGUINAL HERNIA REPAIR WITH MESH;  Surgeon: Axel Filler, MD;  Location: [facility] OR;  Service: General;  Laterality: Left;   left leg blood clot removal 2009  2009   TONSILLECTOMY     WRIST SURGERY     Social History   Occupational History   Occupation: Futures trader, driver   Tobacco Use   Smoking status: Every Day    Packs/day: 1.00    Years: 30.00    Additional pack years: 0.00    Total pack years: 30.00    Types: Cigarettes   Smokeless tobacco: Never   Tobacco comments:    1 to 1.5 ppd   Vaping Use   Vaping Use: Never used  Substance and Sexual Activity   Alcohol use: Yes     Comment: ETOH most days ," 2- 40 oz beers , and a pint of liquor   Drug use: Yes    Types: Marijuana    Comment: 07/09/19   Sexual activity: Yes    Partners: Female

## 2022-12-25 ENCOUNTER — Other Ambulatory Visit: Payer: BC Managed Care – PPO

## 2022-12-26 ENCOUNTER — Telehealth: Payer: Self-pay | Admitting: Orthopaedic Surgery

## 2022-12-26 ENCOUNTER — Other Ambulatory Visit: Payer: Self-pay | Admitting: Physician Assistant

## 2022-12-26 ENCOUNTER — Encounter: Payer: Self-pay | Admitting: Gastroenterology

## 2022-12-26 NOTE — Telephone Encounter (Signed)
Patient wants Tylenol 3 sent to [facility] pharmacy on file.

## 2022-12-26 NOTE — Telephone Encounter (Signed)
Pt called requesting a refill of pain medication. Please send to pharmacy on file. Pt phone number is [phone].

## 2022-12-26 NOTE — Telephone Encounter (Signed)
Does not look like he is scheduled yet for surgery.  I can send in tramadol or tylenol 3, but cannot refill narcotic

## 2022-12-27 ENCOUNTER — Other Ambulatory Visit: Payer: Self-pay | Admitting: Physician Assistant

## 2022-12-27 ENCOUNTER — Telehealth: Payer: Self-pay

## 2022-12-27 ENCOUNTER — Telehealth: Payer: Self-pay | Admitting: Orthopaedic Surgery

## 2022-12-27 MED ORDER — ACETAMINOPHEN-CODEINE 300-30 MG PO TABS: 1.0000 | ORAL_TABLET | Freq: Two times a day (BID) | ORAL | 0 refills | Status: DC | PRN

## 2022-12-27 NOTE — Telephone Encounter (Signed)
Patient advising he still hasn't gottent he's pain medication and want to speak to someone

## 2022-12-27 NOTE — Telephone Encounter (Signed)
Form faxed back to ATTN: Chrystine Oiler at [phone]. Form sent for scanning.

## 2022-12-27 NOTE — Telephone Encounter (Signed)
Sent to pharmacy on file 

## 2022-12-27 NOTE — Telephone Encounter (Signed)
I gave to [name]

## 2022-12-27 NOTE — Telephone Encounter (Signed)
Patient is cleared from my side, needs cardiology clearance.  Needs close monitoring of BP during the perioperative period.

## 2022-12-27 NOTE — Telephone Encounter (Signed)
Called and advised.

## 2022-12-27 NOTE — Telephone Encounter (Signed)
Received surgical clearance form from [facility]. Pt is needing L total hip arthroplasty w/ Dr. Roda Shutters. Last OV: 12/19/22 and last EKG 05/10/22. Please advise if he needs appt to complete. Thank you.

## 2022-12-27 NOTE — Telephone Encounter (Signed)
   Pre-operative Risk Assessment    Patient Name: [name]  DOB: [birth year] MRN: [id number]      Request for Surgical Clearance    Procedure:   LT TOTAL HIP   Date of Surgery:  Clearance TBD                                 Surgeon:  DR. Etter Sjogren XU Surgeon's Group or Practice Name:  [facility] Phone number:  [phone] Fax number:  [phone]   Type of Clearance Requested:   - Pharmacy:  Hold Apixaban (Eliquis) 3 DAYS PRIOR TO SURGERY   Type of Anesthesia:  Spinal   Additional requests/questions:    SignedMichaelle Copas   12/27/2022, 10:08 AM

## 2022-12-27 NOTE — Telephone Encounter (Signed)
See message from xu

## 2022-12-27 NOTE — Telephone Encounter (Signed)
Roda Shutters, do you have surgery sheet?

## 2022-12-27 NOTE — Telephone Encounter (Signed)
Received fax confirmation

## 2022-12-28 ENCOUNTER — Telehealth: Payer: Self-pay | Admitting: Orthopaedic Surgery

## 2022-12-28 NOTE — Telephone Encounter (Signed)
Ok, thanks.

## 2022-12-28 NOTE — Telephone Encounter (Signed)
FMLA forms received. To [employer]. 

## 2022-12-29 NOTE — Telephone Encounter (Signed)
Patient previously on warfarin, now on Eliquis, for history of left femoral and popliteal artery occlusions in 2008 requiring embolectomy. He also experienced a renal infarction at that time.   Procedure: left total hip replacement Date of procedure: TBD  CrCl >158mL/min Plt count 195K  Pt used to be bridged with Lovenox for procedures when he was on warfarin. Now on Eliquis which requires shorter periprocedural hold time of 3 days before procedure. Will forward to MD to see if 3 day hold is acceptable without bridging given prior DVT/renal infarct occurred in 2008.

## 2022-12-30 NOTE — Telephone Encounter (Addendum)
   Patient Name: [name]  DOB: 27-Sep-1960 MRN: [id number]  Primary Cardiologist: [name], MD  Clinical pharmacists have reviewed the patient's past medical history, labs, and current medications as part of preoperative protocol coverage. The following recommendations have been made:  Patient is cleared to hold Eliquis 3 days prior to procedure and should resume as soon as safely possible following procedure.   I will route this recommendation to the requesting party via Epic fax function and remove from pre-op pool.  Please call with questions.  Napoleon Form, Leodis Rains, NP 12/30/2022, 3:52 PM  12/30/2022, 3:50 PM

## 2022-12-30 NOTE — Telephone Encounter (Signed)
Yes I agree. Acceptable to hold without bridge.

## 2022-12-30 NOTE — Telephone Encounter (Signed)
Ok to hold Eliquis for 3 days prior to THA. He should resume as soon as safely possible after.

## 2023-01-05 ENCOUNTER — Telehealth: Payer: Self-pay | Admitting: Orthopaedic Surgery

## 2023-01-05 ENCOUNTER — Other Ambulatory Visit: Payer: Self-pay | Admitting: Physician Assistant

## 2023-01-05 NOTE — Telephone Encounter (Signed)
Patient would like a different rx for pain the current one tylenol 3 is not working well for pain

## 2023-01-06 ENCOUNTER — Other Ambulatory Visit: Payer: Self-pay | Admitting: Physician Assistant

## 2023-01-06 MED ORDER — HYDROCODONE-ACETAMINOPHEN 5-325 MG PO TABS: 1.0000 | ORAL_TABLET | Freq: Three times a day (TID) | ORAL | 0 refills | Status: DC | PRN

## 2023-01-06 NOTE — Telephone Encounter (Signed)
Notified patient.

## 2023-01-06 NOTE — Telephone Encounter (Signed)
sent 

## 2023-01-12 ENCOUNTER — Other Ambulatory Visit: Payer: Self-pay | Admitting: Physician Assistant

## 2023-01-12 ENCOUNTER — Telehealth: Payer: Self-pay | Admitting: Orthopaedic Surgery

## 2023-01-12 MED ORDER — HYDROCODONE-ACETAMINOPHEN 5-325 MG PO TABS: 1.0000 | ORAL_TABLET | Freq: Three times a day (TID) | ORAL | 0 refills | Status: DC | PRN

## 2023-01-12 NOTE — Telephone Encounter (Signed)
Refilled norco, not oxy

## 2023-01-12 NOTE — Telephone Encounter (Signed)
Pt called requesting refill of oxycodone. Please send to [facility] on [address]. Pt phone number is [phone].

## 2023-01-19 ENCOUNTER — Other Ambulatory Visit: Payer: Self-pay | Admitting: Physician Assistant

## 2023-01-19 ENCOUNTER — Telehealth: Payer: Self-pay | Admitting: Orthopaedic Surgery

## 2023-01-19 ENCOUNTER — Other Ambulatory Visit: Payer: Self-pay

## 2023-01-19 MED ORDER — HYDROCODONE-ACETAMINOPHEN 5-325 MG PO TABS: 1.0000 | ORAL_TABLET | Freq: Three times a day (TID) | ORAL | 0 refills | Status: DC | PRN

## 2023-01-19 NOTE — Telephone Encounter (Signed)
Sent in meds.  No fasting

## 2023-01-19 NOTE — Telephone Encounter (Signed)
Called and spoke with patient. No reason to fast for labwork. We talked about limiting his pain medicine before surgery to avoid building up tolerance to pain medicine before surgery.

## 2023-01-19 NOTE — Telephone Encounter (Signed)
Pt called requesting refill of pain medication. Pt also needed to know before he comes for blood labs does he need to not eat. Please send medication to pharmacy on file and also call pt if he need to do anything before lab appt. Pt phone number is [phone].

## 2023-01-25 ENCOUNTER — Ambulatory Visit: Payer: BC Managed Care – PPO

## 2023-01-25 ENCOUNTER — Encounter (HOSPITAL_COMMUNITY): Payer: Self-pay

## 2023-01-25 DIAGNOSIS — M87052 Idiopathic aseptic necrosis of left femur: Secondary | ICD-10-CM

## 2023-01-25 NOTE — Pre-Procedure Instructions (Signed)
Surgical Instructions   Your procedure is scheduled on Friday, June 28th. Report to [facility] Main Entrance "A" at 05:30 A.M., then check in with the Admitting office. Any questions or running late day of surgery: call [phone]  Questions prior to your surgery date: call [phone], Monday-Friday, 8am-4pm. If you experience any cold or flu symptoms such as cough, fever, chills, shortness of breath, etc. between now and your scheduled surgery, please notify us at the above number.     Remember:  Do not eat after midnight the night before your surgery  You may drink clear liquids until 04:30 AM the morning of your surgery.   Clear liquids allowed are: Water, Non-Citrus Juices (without pulp), Carbonated Beverages, Clear Tea, Black Coffee Only (NO MILK, CREAM OR POWDERED CREAMER of any kind), and Gatorade.   Patient Instructions  The night before surgery:  No food after midnight. ONLY clear liquids after midnight  The day of surgery (if you do NOT have diabetes):  Drink ONE (1) Pre-Surgery Clear Ensure by 04:30 AM the morning of surgery. Drink in one sitting. Do not sip.  This drink was given to you during your hospital  pre-op appointment visit.  Nothing else to drink after completing the  Pre-Surgery Clear Ensure.          If you have questions, please contact your surgeon's office.     Take these medicines the morning of surgery with A SIP OF WATER  amLODipine (NORVASC)  atorvastatin (LIPITOR)  esomeprazole (NEXIUM)  famotidine (PEPCID)  fexofenadine (ALLEGRA)     May take these medicines IF NEEDED: EPINEPHrine (EPIPEN 2-PAK)  HYDROcodone-acetaminophen (NORCO)     Hold ELIQUIS 3 days prior to surgery. Last dose 6/24.  One week prior to surgery, STOP taking any Aspirin (unless otherwise instructed by your surgeon) Aleve, Naproxen, Ibuprofen, Motrin, Advil, Goody's, BC's, all herbal medications, fish oil, and non-prescription vitamins.                     Do  NOT Smoke (Tobacco/Vaping) for 24 hours prior to your procedure.  If you use a CPAP at night, you may bring your mask/headgear for your overnight stay.   You will be asked to remove any contacts, glasses, piercing's, hearing aid's, dentures/partials prior to surgery. Please bring cases for these items if needed.    Patients discharged the day of surgery will not be allowed to drive home, and someone needs to stay with them for 24 hours.  SURGICAL WAITING ROOM VISITATION Patients may have no more than 2 support people in the waiting area - these visitors may rotate.   Pre-op nurse will coordinate an appropriate time for 1 ADULT support person, who may not rotate, to accompany patient in pre-op.  Children under the age of 55 must have an adult with them who is not the patient and must remain in the main waiting area with an adult.  If the patient needs to stay at the hospital during part of their recovery, the visitor guidelines for inpatient rooms apply.  Please refer to the [facility] website for the visitor guidelines for any additional information.   If you received a COVID test during your pre-op visit  it is requested that you wear a mask when out in public, stay away from anyone that may not be feeling well and notify your surgeon if you develop symptoms. If you have been in contact with anyone that has tested positive in the last 10 days  please notify you surgeon.      Pre-operative 5 CHG Bath Instructions   You can play a key role in reducing the risk of infection after surgery. Your skin needs to be as free of germs as possible. You can reduce the number of germs on your skin by washing with CHG (chlorhexidine gluconate) soap before surgery. CHG is an antiseptic soap that kills germs and continues to kill germs even after washing.   DO NOT use if you have an allergy to chlorhexidine/CHG or antibacterial soaps. If your skin becomes reddened or irritated, stop using the CHG and  notify one of our RNs at [phone].   Please shower with the CHG soap starting 4 days before surgery using the following schedule:     Please keep in mind the following:  DO NOT shave, including legs and underarms, starting the day of your first shower.   You may shave your face at any point before/day of surgery.  Place clean sheets on your bed the day you start using CHG soap. Use a clean washcloth (not used since being washed) for each shower. DO NOT sleep with pets once you start using the CHG.   CHG Shower Instructions:  If you choose to wash your hair and private area, wash first with your normal shampoo/soap.  After you use shampoo/soap, rinse your hair and body thoroughly to remove shampoo/soap residue.  Turn the water OFF and apply about 3 tablespoons (45 ml) of CHG soap to a CLEAN washcloth.  Apply CHG soap ONLY FROM YOUR NECK DOWN TO YOUR TOES (washing for 3-5 minutes)  DO NOT use CHG soap on face, private areas, open wounds, or sores.  Pay special attention to the area where your surgery is being performed.  If you are having back surgery, having someone wash your back for you may be helpful. Wait 2 minutes after CHG soap is applied, then you may rinse off the CHG soap.  Pat dry with a clean towel  Put on clean clothes/pajamas   If you choose to wear lotion, please use ONLY the CHG-compatible lotions on the back of this paper.   Additional instructions for the day of surgery: DO NOT APPLY any lotions, deodorants, cologne, or perfumes.   Do not bring valuables to the hospital. [facility] is not responsible for any belongings/valuables. Do not wear nail polish, gel polish, artificial nails, or any other type of covering on natural nails (fingers and toes) Do not wear jewelry or makeup Put on clean/comfortable clothes.  Please brush your teeth.  Ask your nurse before applying any prescription medications to the skin.     CHG Compatible Lotions   Aveeno  Moisturizing lotion  Cetaphil Moisturizing Cream  Cetaphil Moisturizing Lotion  Clairol Herbal Essence Moisturizing Lotion, Dry Skin  Clairol Herbal Essence Moisturizing Lotion, Extra Dry Skin  Clairol Herbal Essence Moisturizing Lotion, Normal Skin  Curel Age Defying Therapeutic Moisturizing Lotion with Alpha Hydroxy  Curel Extreme Care Body Lotion  Curel Soothing Hands Moisturizing Hand Lotion  Curel Therapeutic Moisturizing Cream, Fragrance-Free  Curel Therapeutic Moisturizing Lotion, Fragrance-Free  Curel Therapeutic Moisturizing Lotion, Original Formula  Eucerin Daily Replenishing Lotion  Eucerin Dry Skin Therapy Plus Alpha Hydroxy Crme  Eucerin Dry Skin Therapy Plus Alpha Hydroxy Lotion  Eucerin Original Crme  Eucerin Original Lotion  Eucerin Plus Crme Eucerin Plus Lotion  Eucerin TriLipid Replenishing Lotion  Keri Anti-Bacterial Hand Lotion  Keri Deep Conditioning Original Lotion Dry Skin Formula Softly Scented  Keri Deep  Conditioning Original Lotion, Fragrance Free Sensitive Skin Formula  Keri Lotion Fast Absorbing Fragrance Free Sensitive Skin Formula  Keri Lotion Fast Absorbing Softly Scented Dry Skin Formula  Keri Original Lotion  Keri Skin Renewal Lotion Keri Silky Smooth Lotion  Keri Silky Smooth Sensitive Skin Lotion  Nivea Body Creamy Conditioning Oil  Nivea Body Extra Enriched Lotion  Nivea Body Original Lotion  Nivea Body Sheer Moisturizing Lotion Nivea Crme  Nivea Skin Firming Lotion  NutraDerm 30 Skin Lotion  NutraDerm Skin Lotion  NutraDerm Therapeutic Skin Cream  NutraDerm Therapeutic Skin Lotion  ProShield Protective Hand Cream  Provon moisturizing lotion  Please read over the following fact sheets that you were given.

## 2023-01-26 ENCOUNTER — Encounter (HOSPITAL_COMMUNITY)
Admission: RE | Admit: 2023-01-26 | Discharge: 2023-01-26 | Disposition: A | Discharge status: Home / Self Care | Payer: BC Managed Care – PPO | Source: Ambulatory Visit | Attending: Orthopaedic Surgery | Admitting: Orthopaedic Surgery

## 2023-01-26 ENCOUNTER — Other Ambulatory Visit: Payer: Self-pay

## 2023-01-26 ENCOUNTER — Telehealth: Payer: Self-pay | Admitting: Orthopaedic Surgery

## 2023-01-26 ENCOUNTER — Other Ambulatory Visit: Payer: Self-pay | Admitting: Orthopaedic Surgery

## 2023-01-26 ENCOUNTER — Encounter (HOSPITAL_COMMUNITY): Payer: Self-pay

## 2023-01-26 VITALS — BP 126/88 | HR 96 | Temp 98.5°F | Resp 18 | Ht 73.0 in | Wt 169.9 lb

## 2023-01-26 DIAGNOSIS — F17200 Nicotine dependence, unspecified, uncomplicated: Secondary | ICD-10-CM | POA: Diagnosis not present

## 2023-01-26 DIAGNOSIS — M87052 Idiopathic aseptic necrosis of left femur: Secondary | ICD-10-CM | POA: Diagnosis not present

## 2023-01-26 DIAGNOSIS — Z789 Other specified health status: Secondary | ICD-10-CM | POA: Diagnosis not present

## 2023-01-26 DIAGNOSIS — Z01818 Encounter for other preprocedural examination: Secondary | ICD-10-CM | POA: Diagnosis present

## 2023-01-26 DIAGNOSIS — I10 Essential (primary) hypertension: Secondary | ICD-10-CM | POA: Diagnosis not present

## 2023-01-26 DIAGNOSIS — E876 Hypokalemia: Secondary | ICD-10-CM | POA: Insufficient documentation

## 2023-01-26 DIAGNOSIS — I739 Peripheral vascular disease, unspecified: Secondary | ICD-10-CM | POA: Diagnosis not present

## 2023-01-26 LAB — COMPREHENSIVE METABOLIC PANEL
ALT: 29 U/L (ref 0–44)
AST: 29 U/L (ref 15–41)
Albumin: 3.6 g/dL (ref 3.5–5.0)
Alkaline Phosphatase: 136 U/L — ABNORMAL HIGH (ref 38–126)
Anion gap: 10 (ref 5–15)
BUN: 14 mg/dL (ref 6–20)
CO2: 28 mmol/L (ref 22–32)
Calcium: 9.8 mg/dL (ref 8.9–10.3)
Chloride: 99 mmol/L (ref 98–111)
Creatinine, Ser: 0.75 mg/dL (ref 0.61–1.24)
GFR, Estimated: >60 mL/min (ref >60)
Glucose, Bld: 98 mg/dL (ref 70–99)
Potassium: 3.1 mmol/L — ABNORMAL LOW (ref 3.5–5.1)
Sodium: 137 mmol/L (ref 135–145)
Total Bilirubin: 0.6 mg/dL (ref 0.3–1.2)
Total Protein: 6.9 g/dL (ref 6.5–8.1)

## 2023-01-26 LAB — CBC
HCT: 41.2 % (ref 39.0–52.0)
Hemoglobin: 13.6 g/dL (ref 13.0–17.0)
MCH: 31.3 pg (ref 26.0–34.0)
MCHC: 33 g/dL (ref 30.0–36.0)
MCV: 94.7 fL (ref 80.0–100.0)
Platelets: 302 10*3/uL (ref 150–400)
RBC: 4.35 MIL/uL (ref 4.22–5.81)
RDW: 14.4 % (ref 11.5–15.5)
WBC: 7.9 10*3/uL (ref 4.0–10.5)
nRBC: 0 % (ref 0.0–0.2)

## 2023-01-26 LAB — HEMOGLOBIN A1C
Hgb A1c MFr Bld: 5.7 % of total Hgb — ABNORMAL HIGH (ref <5.7)
Mean Plasma Glucose: 117 mg/dL
eAG (mmol/L): 6.5 mmol/L

## 2023-01-26 LAB — SURGICAL PCR SCREEN
MRSA, PCR: NEGATIVE
Staphylococcus aureus: NEGATIVE

## 2023-01-26 LAB — PREALBUMIN: Prealbumin: 32 mg/dL (ref 21–43)

## 2023-01-26 MED ORDER — POTASSIUM CHLORIDE CRYS ER 20 MEQ PO TBCR: 20.0000 meq | EXTENDED_RELEASE_TABLET | Freq: Every day | ORAL | 0 refills | Status: DC

## 2023-01-26 MED ORDER — HYDROCODONE-ACETAMINOPHEN 5-325 MG PO TABS: 1.0000 | ORAL_TABLET | Freq: Three times a day (TID) | ORAL | 0 refills | Status: DC | PRN

## 2023-01-26 NOTE — Progress Notes (Signed)
Please let him know I sent potassium tablets for him to take before surgery.  He needs to take a tablet each day.  Thanks.

## 2023-01-26 NOTE — Telephone Encounter (Signed)
Pt called requesting a refill of hydrocodone. Pt state he has I wk left for surgery and need a refill. Please send to pharmacy on file. Pt phone number is [phone].

## 2023-01-26 NOTE — Telephone Encounter (Signed)
Called and advised.

## 2023-01-26 NOTE — Progress Notes (Signed)
PCP - Dr. [name] Cardiologist - Dr/ [name]  PPM/ICD - denies   Chest x-ray - 01/08/20 EKG - 05/10/22 Stress Test - 05/30/22 ECHO - 07/14/11 Cardiac Cath - 12/2010  Sleep Study - denies   DM- denies  Blood Thinner Instructions: Hold Eliquis 3 days prior to surgery. Last dose 6/24 Aspirin Instructions: n/a  ERAS Protcol - yes PRE-SURGERY Ensure given at PAT  COVID TEST- n/a   Anesthesia review: yes, cardiac hx  Patient denies shortness of breath, fever, cough and chest pain at PAT appointment   All instructions explained to the patient, with a verbal understanding of the material. Patient agrees to go over the instructions while at home for a better understanding. The opportunity to ask questions was provided.

## 2023-01-26 NOTE — Telephone Encounter (Signed)
done

## 2023-01-27 NOTE — Progress Notes (Signed)
Anesthesia Chart Review:  Follows with cardiology for hx of peripheral arterial disease, HTN,  and hypercoagulable disorder. He has a history of left femoral and popliteal artery occlusions in 2008 requiring embolectomy. He also experienced a renal infarction at that time. He underwent cardiac catheterization in 2012 demonstrating no evidence of coronary artery disease. Nuclear stress test 05/2022 was low risk. Clearance per telephone encounter 12/30/22 by Robin Searing, NP, "Clinical pharmacists have reviewed the patient's past medical history, labs, and current medications as part of preoperative protocol coverage. The following recommendations have been made: Patient is cleared to hold Eliquis 3 days prior to procedure and should resume as soon as safely possible following procedure."  Pt reports LD Eliquis 01/30/23.  Current every day smoker.   Proep labs reviewed, mild hypokalemia potassium 3.1, otherwise unremarkable.   EKG 05/10/22: NSR. Rate 95.   Nuclear stress test 05/30/22:   The study is normal. The study is low risk.   No ST deviation was noted.   LV perfusion is normal. There is no evidence of ischemia. There is no evidence of infarction.   Left ventricular function is normal. Nuclear stress EF: 63 %. The left ventricular ejection fraction is normal (55-65%). End diastolic cavity size is normal. End systolic cavity size is normal.   Prior study available for comparison from 01/15/2014.   Normal resting and stress perfusion. No ischemia or infarction EF 63%    [name] [facility] Short Stay Center/Anesthesiology Phone [phone] 01/27/2023 3:50 PM

## 2023-01-27 NOTE — Anesthesia Preprocedure Evaluation (Addendum)
Anesthesia Evaluation  Patient identified by MRN, date of birth, ID band Patient awake    Reviewed: Allergy & Precautions, H&P , NPO status , Patient's Chart, lab work & pertinent test results  Airway Mallampati: II  TM Distance: >3 FB Neck ROM: Full    Dental no notable dental hx. (+) Dental Advisory Given, Teeth Intact   Pulmonary pneumonia, Current Smoker and Patient abstained from smoking.   Pulmonary exam normal breath sounds clear to auscultation       Cardiovascular hypertension, Pt. on medications + Peripheral Vascular Disease and + DVT  Normal cardiovascular exam Rhythm:Regular Rate:Normal  Small PFO   Stress 05/2022   The study is normal. The study is low risk.   No ST deviation was noted.   LV perfusion is normal. There is no evidence of ischemia. There is no evidence of infarction.   Left ventricular function is normal. Nuclear stress EF: 63 %. The left ventricular ejection fraction is normal (55-65%). End diastolic cavity size is normal. End systolic cavity size is normal.   Prior study available for comparison from 01/15/2014.   Normal resting and stress perfusion. No ischemia or infarction EF 63%    Neuro/Psych    GI/Hepatic ,GERD  ,,  Endo/Other    Renal/GU Renal InsufficiencyRenal disease     Musculoskeletal  (+) Arthritis ,    Abdominal   Peds  Hematology   Anesthesia Other Findings   Reproductive/Obstetrics                              Anesthesia Physical Anesthesia Plan  ASA: 3  Anesthesia Plan: Spinal   Post-op Pain Management: Tylenol PO (pre-op)* and Gabapentin PO (pre-op)*   Induction: Intravenous  PONV Risk Score and Plan: 1 and Ondansetron, Dexamethasone, Midazolam and Treatment may vary due to age or medical condition  Airway Management Planned: Natural Airway  Additional Equipment:   Intra-op Plan:   Post-operative Plan:   Informed  Consent: I have reviewed the patients History and Physical, chart, labs and discussed the procedure including the risks, benefits and alternatives for the proposed anesthesia with the patient or authorized representative who has indicated his/her understanding and acceptance.     Dental advisory given  Plan Discussed with: CRNA  Anesthesia Plan Comments: (PAT note by Antionette Poles, PA-C:  Follows with cardiology for hx of peripheral arterial disease, HTN,  and hypercoagulable disorder. He has a history of left femoral and popliteal artery occlusions in 2008 requiring embolectomy. He also experienced a renal infarction at that time. He underwent cardiac catheterization in 2012 demonstrating no evidence of coronary artery disease. Nuclear stress test 05/2022 was low risk. Clearance per telephone encounter 12/30/22 by Robin Searing, NP, "Clinical pharmacists have reviewed the patient's past medical history, labs, and current medications as part of preoperative protocol coverage. The following recommendations have been made: Patient is cleared to hold Eliquis 3 days prior to procedure and should resume as soon as safely possible following procedure."  Pt reports LD Eliquis 01/30/23.  Current every day smoker.   Proep labs reviewed, mild hypokalemia potassium 3.1, otherwise unremarkable.   EKG 05/10/22: NSR. Rate 95.   Nuclear stress test 05/30/22:   The study is normal. The study is low risk.   No ST deviation was noted.   LV perfusion is normal. There is no evidence of ischemia. There is no evidence of infarction.   Left ventricular function is normal. Nuclear stress  EF: 63 %. The left ventricular ejection fraction is normal (55-65%). End diastolic cavity size is normal. End systolic cavity size is normal.   Prior study available for comparison from 01/15/2014.   Normal resting and stress perfusion. No ischemia or infarction EF 63%   )         Anesthesia Quick Evaluation

## 2023-01-30 ENCOUNTER — Telehealth: Payer: Self-pay | Admitting: Physician Assistant

## 2023-01-30 ENCOUNTER — Other Ambulatory Visit: Payer: Self-pay | Admitting: Physician Assistant

## 2023-01-30 ENCOUNTER — Telehealth: Payer: Self-pay | Admitting: Orthopaedic Surgery

## 2023-01-30 ENCOUNTER — Telehealth: Payer: Self-pay | Admitting: Radiology

## 2023-01-30 MED ORDER — POTASSIUM CHLORIDE CRYS ER 10 MEQ PO TBCR: EXTENDED_RELEASE_TABLET | ORAL | 0 refills | Status: DC

## 2023-01-30 MED ORDER — ONDANSETRON HCL 4 MG PO TABS: 4.0000 mg | ORAL_TABLET | Freq: Three times a day (TID) | ORAL | 0 refills | Status: DC | PRN

## 2023-01-30 MED ORDER — DOCUSATE SODIUM 100 MG PO CAPS
100.0000 mg | ORAL_CAPSULE | Freq: Every day | ORAL | 2 refills | Status: DC | PRN
Start: 2023-01-30 — End: 2023-07-19

## 2023-01-30 MED ORDER — METHOCARBAMOL 750 MG PO TABS: 750.0000 mg | ORAL_TABLET | Freq: Two times a day (BID) | ORAL | 2 refills | Status: DC | PRN

## 2023-01-30 MED ORDER — OXYCODONE-ACETAMINOPHEN 5-325 MG PO TABS: 1.0000 | ORAL_TABLET | Freq: Three times a day (TID) | ORAL | 0 refills | Status: DC | PRN

## 2023-01-30 NOTE — Progress Notes (Signed)
Called and LMOM for patient.  

## 2023-01-30 NOTE — Telephone Encounter (Signed)
What does this mean

## 2023-01-30 NOTE — Telephone Encounter (Signed)
Ok thanks 

## 2023-01-30 NOTE — Telephone Encounter (Signed)
Pt's daughter Allyson Sabal cane with 5 cash for care taker FMLA for pt surgery. Payment 01/30/23

## 2023-01-30 NOTE — Telephone Encounter (Signed)
Called and explained the medications to patient.

## 2023-01-30 NOTE — Progress Notes (Signed)
Can you let patient know that his potassium is low and I have called in supplementation that he needs to start taking asap

## 2023-01-30 NOTE — Telephone Encounter (Signed)
Correction from last note. Pt's daughter Leavy Cella call in and paid $25.00 cash for FMLA forms to be filled out for caregiver (spouse) employer

## 2023-01-30 NOTE — Telephone Encounter (Signed)
Noted for [employer]. 

## 2023-02-02 DIAGNOSIS — M1612 Unilateral primary osteoarthritis, left hip: Secondary | ICD-10-CM | POA: Insufficient documentation

## 2023-02-02 MED ORDER — SODIUM CHLORIDE 0.9 % IV SOLN: 2000.0000 mg | INTRAVENOUS | Status: DC

## 2023-02-02 MED ORDER — TRANEXAMIC ACID 1000 MG/10ML IV SOLN
2000.0000 mg | INTRAVENOUS | Status: DC
  Filled 2023-02-02: qty 20

## 2023-02-03 ENCOUNTER — Ambulatory Visit (HOSPITAL_COMMUNITY): Payer: BC Managed Care – PPO

## 2023-02-03 ENCOUNTER — Observation Stay (HOSPITAL_COMMUNITY)
Admission: RE | Admit: 2023-02-03 | Discharge: 2023-02-04 | Disposition: A | Discharge status: Home Health | Payer: BC Managed Care – PPO | Source: Ambulatory Visit | Attending: Orthopaedic Surgery | Admitting: Orthopaedic Surgery

## 2023-02-03 ENCOUNTER — Observation Stay (HOSPITAL_COMMUNITY): Payer: BC Managed Care – PPO

## 2023-02-03 ENCOUNTER — Ambulatory Visit (HOSPITAL_COMMUNITY): Payer: BC Managed Care – PPO | Admitting: Physician Assistant

## 2023-02-03 ENCOUNTER — Ambulatory Visit (HOSPITAL_COMMUNITY): Payer: BC Managed Care – PPO | Admitting: Anesthesiology

## 2023-02-03 ENCOUNTER — Other Ambulatory Visit: Payer: Self-pay

## 2023-02-03 ENCOUNTER — Encounter (HOSPITAL_COMMUNITY): Payer: Self-pay | Admitting: Orthopaedic Surgery

## 2023-02-03 ENCOUNTER — Encounter (HOSPITAL_COMMUNITY)
Admission: RE | Disposition: A | Discharge status: Home Health | Payer: Self-pay | Source: Ambulatory Visit | Attending: Orthopaedic Surgery

## 2023-02-03 DIAGNOSIS — M87052 Idiopathic aseptic necrosis of left femur: Secondary | ICD-10-CM

## 2023-02-03 DIAGNOSIS — I10 Essential (primary) hypertension: Secondary | ICD-10-CM | POA: Diagnosis not present

## 2023-02-03 DIAGNOSIS — F17210 Nicotine dependence, cigarettes, uncomplicated: Secondary | ICD-10-CM | POA: Insufficient documentation

## 2023-02-03 DIAGNOSIS — Z7901 Long term (current) use of anticoagulants: Secondary | ICD-10-CM | POA: Diagnosis not present

## 2023-02-03 DIAGNOSIS — M1612 Unilateral primary osteoarthritis, left hip: Secondary | ICD-10-CM | POA: Insufficient documentation

## 2023-02-03 DIAGNOSIS — M87852 Other osteonecrosis, left femur: Principal | ICD-10-CM | POA: Insufficient documentation

## 2023-02-03 DIAGNOSIS — Z86718 Personal history of other venous thrombosis and embolism: Secondary | ICD-10-CM | POA: Insufficient documentation

## 2023-02-03 DIAGNOSIS — Z96642 Presence of left artificial hip joint: Secondary | ICD-10-CM

## 2023-02-03 HISTORY — PX: TOTAL HIP ARTHROPLASTY: SHX124

## 2023-02-03 LAB — TYPE AND SCREEN
ABO/RH(D): A POS
Antibody Screen: NEGATIVE

## 2023-02-03 LAB — ABO/RH: ABO/RH(D): A POS

## 2023-02-03 SURGERY — ARTHROPLASTY, HIP, TOTAL, ANTERIOR APPROACH
Anesthesia: Spinal | Site: Hip | Laterality: Left

## 2023-02-03 MED ORDER — STERILE WATER FOR IRRIGATION IR SOLN
Status: DC | PRN
  Administered 2023-02-03: 1000 mL

## 2023-02-03 MED ORDER — ACETAMINOPHEN 500 MG PO TABS
1000.0000 mg | ORAL_TABLET | Freq: Four times a day (QID) | ORAL | Status: AC
  Administered 2023-02-03 – 2023-02-04 (×4): 1000 mg via ORAL
  Filled 2023-02-03 (×4): qty 2

## 2023-02-03 MED ORDER — DEXAMETHASONE SODIUM PHOSPHATE 10 MG/ML IJ SOLN
INTRAMUSCULAR | Status: DC | PRN
  Administered 2023-02-03: 8 mg via INTRAVENOUS

## 2023-02-03 MED ORDER — HYDROMORPHONE HCL 1 MG/ML IJ SOLN
0.5000 mg | INTRAMUSCULAR | Status: DC | PRN
  Administered 2023-02-03: 1 mg via INTRAVENOUS
  Filled 2023-02-03: qty 1

## 2023-02-03 MED ORDER — METOCLOPRAMIDE HCL 5 MG/ML IJ SOLN: 5.0000 mg | Freq: Three times a day (TID) | INTRAMUSCULAR | Status: DC | PRN

## 2023-02-03 MED ORDER — PROPOFOL 500 MG/50ML IV EMUL
INTRAVENOUS | Status: DC | PRN
  Administered 2023-02-03: 30 mg via INTRAVENOUS
  Administered 2023-02-03: 50 ug/kg/min via INTRAVENOUS
  Administered 2023-02-03: 20 mg via INTRAVENOUS

## 2023-02-03 MED ORDER — SODIUM CHLORIDE 0.9 % IR SOLN
Status: DC | PRN
  Administered 2023-02-03: 1000 mL

## 2023-02-03 MED ORDER — PHENOL 1.4 % MT LIQD: 1.0000 | OROMUCOSAL | Status: DC | PRN

## 2023-02-03 MED ORDER — CHLORHEXIDINE GLUCONATE 0.12 % MT SOLN: 15.0000 mL | Freq: Once | OROMUCOSAL | Status: AC

## 2023-02-03 MED ORDER — ALBUMIN HUMAN 5 % IV SOLN: INTRAVENOUS | Status: DC | PRN

## 2023-02-03 MED ORDER — METHOCARBAMOL 1000 MG/10ML IJ SOLN: 500.0000 mg | Freq: Four times a day (QID) | INTRAVENOUS | Status: DC | PRN

## 2023-02-03 MED ORDER — PHENYLEPHRINE HCL-NACL 20-0.9 MG/250ML-% IV SOLN
INTRAVENOUS | Status: DC | PRN
  Administered 2023-02-03: 10 ug/min via INTRAVENOUS

## 2023-02-03 MED ORDER — ONDANSETRON HCL 4 MG/2ML IJ SOLN: 4.0000 mg | Freq: Four times a day (QID) | INTRAMUSCULAR | Status: DC | PRN

## 2023-02-03 MED ORDER — SORBITOL 70 % SOLN: 30.0000 mL | Freq: Every day | Status: DC | PRN

## 2023-02-03 MED ORDER — TRANEXAMIC ACID-NACL 1000-0.7 MG/100ML-% IV SOLN
1000.0000 mg | Freq: Once | INTRAVENOUS | Status: AC
  Administered 2023-02-03: 1000 mg via INTRAVENOUS
  Filled 2023-02-03: qty 100

## 2023-02-03 MED ORDER — MIDAZOLAM HCL 2 MG/2ML IJ SOLN
INTRAMUSCULAR | Status: AC
  Filled 2023-02-03: qty 2

## 2023-02-03 MED ORDER — TRANEXAMIC ACID 1000 MG/10ML IV SOLN
INTRAVENOUS | Status: DC | PRN
  Administered 2023-02-03: 2000 mg via TOPICAL

## 2023-02-03 MED ORDER — FENTANYL CITRATE (PF) 250 MCG/5ML IJ SOLN
INTRAMUSCULAR | Status: DC | PRN
  Administered 2023-02-03 (×2): 50 ug via INTRAVENOUS

## 2023-02-03 MED ORDER — FENTANYL CITRATE (PF) 250 MCG/5ML IJ SOLN
INTRAMUSCULAR | Status: AC
  Filled 2023-02-03: qty 5

## 2023-02-03 MED ORDER — METHOCARBAMOL 500 MG PO TABS
500.0000 mg | ORAL_TABLET | Freq: Four times a day (QID) | ORAL | Status: DC | PRN
  Administered 2023-02-03 – 2023-02-04 (×4): 500 mg via ORAL
  Filled 2023-02-03 (×4): qty 1

## 2023-02-03 MED ORDER — AMLODIPINE BESYLATE 5 MG PO TABS
5.0000 mg | ORAL_TABLET | Freq: Every day | ORAL | Status: DC
  Administered 2023-02-03 – 2023-02-04 (×2): 5 mg via ORAL
  Filled 2023-02-03 (×2): qty 1

## 2023-02-03 MED ORDER — DOCUSATE SODIUM 100 MG PO CAPS
100.0000 mg | ORAL_CAPSULE | Freq: Two times a day (BID) | ORAL | Status: DC
  Administered 2023-02-03 – 2023-02-04 (×2): 100 mg via ORAL
  Filled 2023-02-03 (×2): qty 1

## 2023-02-03 MED ORDER — PANTOPRAZOLE SODIUM 40 MG PO TBEC
40.0000 mg | DELAYED_RELEASE_TABLET | Freq: Every day | ORAL | Status: DC
  Administered 2023-02-03 – 2023-02-04 (×2): 40 mg via ORAL
  Filled 2023-02-03 (×2): qty 1

## 2023-02-03 MED ORDER — CEFAZOLIN SODIUM-DEXTROSE 2-4 GM/100ML-% IV SOLN
2.0000 g | INTRAVENOUS | Status: AC
  Administered 2023-02-03: 2 g via INTRAVENOUS

## 2023-02-03 MED ORDER — POVIDONE-IODINE 10 % EX SWAB
2.0000 | Freq: Once | CUTANEOUS | Status: AC
  Administered 2023-02-03: 2 via TOPICAL

## 2023-02-03 MED ORDER — ONDANSETRON HCL 4 MG/2ML IJ SOLN
INTRAMUSCULAR | Status: DC | PRN
  Administered 2023-02-03: 4 mg via INTRAVENOUS

## 2023-02-03 MED ORDER — ORAL CARE MOUTH RINSE: 15.0000 mL | Freq: Once | OROMUCOSAL | Status: AC

## 2023-02-03 MED ORDER — BUPIVACAINE-MELOXICAM ER 400-12 MG/14ML IJ SOLN
INTRAMUSCULAR | Status: AC
  Filled 2023-02-03: qty 1

## 2023-02-03 MED ORDER — VANCOMYCIN HCL 1 G IV SOLR
INTRAVENOUS | Status: DC | PRN
  Administered 2023-02-03: 1000 mg via TOPICAL

## 2023-02-03 MED ORDER — METOCLOPRAMIDE HCL 5 MG PO TABS: 5.0000 mg | ORAL_TABLET | Freq: Three times a day (TID) | ORAL | Status: DC | PRN

## 2023-02-03 MED ORDER — ONDANSETRON HCL 4 MG PO TABS: 4.0000 mg | ORAL_TABLET | Freq: Four times a day (QID) | ORAL | Status: DC | PRN

## 2023-02-03 MED ORDER — CEFAZOLIN SODIUM-DEXTROSE 2-4 GM/100ML-% IV SOLN
2.0000 g | Freq: Four times a day (QID) | INTRAVENOUS | Status: AC
  Administered 2023-02-03 (×2): 2 g via INTRAVENOUS
  Filled 2023-02-03 (×2): qty 100

## 2023-02-03 MED ORDER — FERROUS SULFATE 325 (65 FE) MG PO TABS
325.0000 mg | ORAL_TABLET | Freq: Three times a day (TID) | ORAL | Status: DC
  Administered 2023-02-03 – 2023-02-04 (×3): 325 mg via ORAL
  Filled 2023-02-03 (×3): qty 1

## 2023-02-03 MED ORDER — ACETAMINOPHEN 500 MG PO TABS
1000.0000 mg | ORAL_TABLET | Freq: Once | ORAL | Status: AC
  Administered 2023-02-03: 1000 mg via ORAL
  Filled 2023-02-03: qty 2

## 2023-02-03 MED ORDER — DIPHENHYDRAMINE HCL 12.5 MG/5ML PO ELIX: 25.0000 mg | ORAL_SOLUTION | ORAL | Status: DC | PRN

## 2023-02-03 MED ORDER — OXYCODONE HCL 5 MG PO TABS
5.0000 mg | ORAL_TABLET | ORAL | Status: DC | PRN
  Administered 2023-02-03 – 2023-02-04 (×5): 10 mg via ORAL
  Filled 2023-02-03 (×5): qty 2

## 2023-02-03 MED ORDER — 0.9 % SODIUM CHLORIDE (POUR BTL) OPTIME
TOPICAL | Status: DC | PRN
  Administered 2023-02-03: 1000 mL

## 2023-02-03 MED ORDER — BUPIVACAINE LIPOSOME 1.3 % IJ SUSP
INTRAMUSCULAR | Status: AC
  Filled 2023-02-03: qty 20

## 2023-02-03 MED ORDER — HYDROMORPHONE HCL 1 MG/ML IJ SOLN: 0.2500 mg | INTRAMUSCULAR | Status: DC | PRN

## 2023-02-03 MED ORDER — ACETAMINOPHEN 325 MG PO TABS: 325.0000 mg | ORAL_TABLET | Freq: Four times a day (QID) | ORAL | Status: DC | PRN

## 2023-02-03 MED ORDER — LACTATED RINGERS IV SOLN: INTRAVENOUS | Status: DC

## 2023-02-03 MED ORDER — TRANEXAMIC ACID-NACL 1000-0.7 MG/100ML-% IV SOLN
INTRAVENOUS | Status: AC
  Filled 2023-02-03: qty 100

## 2023-02-03 MED ORDER — MIDAZOLAM HCL 2 MG/2ML IJ SOLN
INTRAMUSCULAR | Status: DC | PRN
  Administered 2023-02-03 (×2): 2 mg via INTRAVENOUS

## 2023-02-03 MED ORDER — PRONTOSAN WOUND IRRIGATION OPTIME
TOPICAL | Status: DC | PRN
  Administered 2023-02-03: 350 mL via TOPICAL

## 2023-02-03 MED ORDER — DEXAMETHASONE SODIUM PHOSPHATE 10 MG/ML IJ SOLN: 10.0000 mg | Freq: Once | INTRAMUSCULAR | Status: DC

## 2023-02-03 MED ORDER — BUPIVACAINE-MELOXICAM ER 400-12 MG/14ML IJ SOLN
INTRAMUSCULAR | Status: DC | PRN
  Administered 2023-02-03: 400 mg

## 2023-02-03 MED ORDER — MAGNESIUM CITRATE PO SOLN: 1.0000 | Freq: Once | ORAL | Status: DC | PRN

## 2023-02-03 MED ORDER — ALUM & MAG HYDROXIDE-SIMETH 200-200-20 MG/5ML PO SUSP
30.0000 mL | ORAL | Status: DC | PRN
  Administered 2023-02-03: 30 mL via ORAL
  Filled 2023-02-03: qty 30

## 2023-02-03 MED ORDER — CHLORHEXIDINE GLUCONATE 0.12 % MT SOLN
OROMUCOSAL | Status: AC
  Administered 2023-02-03: 15 mL via OROMUCOSAL
  Filled 2023-02-03: qty 15

## 2023-02-03 MED ORDER — POLYETHYLENE GLYCOL 3350 17 G PO PACK
17.0000 g | PACK | Freq: Every day | ORAL | Status: DC
  Administered 2023-02-03: 17 g via ORAL
  Filled 2023-02-03: qty 1

## 2023-02-03 MED ORDER — PANTOPRAZOLE SODIUM 40 MG PO TBEC
40.0000 mg | DELAYED_RELEASE_TABLET | Freq: Once | ORAL | Status: AC
  Administered 2023-02-04: 40 mg via ORAL
  Filled 2023-02-03: qty 1

## 2023-02-03 MED ORDER — OXYCODONE HCL 5 MG PO TABS: 10.0000 mg | ORAL_TABLET | ORAL | Status: DC | PRN

## 2023-02-03 MED ORDER — APIXABAN 5 MG PO TABS
5.0000 mg | ORAL_TABLET | Freq: Two times a day (BID) | ORAL | Status: DC
  Administered 2023-02-04: 5 mg via ORAL
  Filled 2023-02-03 (×2): qty 1

## 2023-02-03 MED ORDER — BUPIVACAINE IN DEXTROSE 0.75-8.25 % IT SOLN
INTRATHECAL | Status: DC | PRN
  Administered 2023-02-03: 1.8 mL via INTRATHECAL

## 2023-02-03 MED ORDER — SODIUM CHLORIDE 0.9 % IV SOLN: INTRAVENOUS | Status: DC

## 2023-02-03 MED ORDER — DEXAMETHASONE SODIUM PHOSPHATE 10 MG/ML IJ SOLN
10.0000 mg | Freq: Once | INTRAMUSCULAR | Status: AC
  Administered 2023-02-04: 10 mg via INTRAVENOUS
  Filled 2023-02-03: qty 1

## 2023-02-03 MED ORDER — VANCOMYCIN HCL 1000 MG IV SOLR
INTRAVENOUS | Status: AC
  Filled 2023-02-03: qty 20

## 2023-02-03 MED ORDER — AMISULPRIDE (ANTIEMETIC) 5 MG/2ML IV SOLN: 10.0000 mg | Freq: Once | INTRAVENOUS | Status: DC | PRN

## 2023-02-03 MED ORDER — TRANEXAMIC ACID-NACL 1000-0.7 MG/100ML-% IV SOLN
1000.0000 mg | INTRAVENOUS | Status: AC
  Administered 2023-02-03: 1000 mg via INTRAVENOUS

## 2023-02-03 MED ORDER — ASPIRIN 81 MG PO CHEW
81.0000 mg | CHEWABLE_TABLET | Freq: Once | ORAL | Status: AC
  Administered 2023-02-03: 81 mg via ORAL
  Filled 2023-02-03: qty 1

## 2023-02-03 MED ORDER — PROMETHAZINE HCL 25 MG/ML IJ SOLN: 6.2500 mg | INTRAMUSCULAR | Status: DC | PRN

## 2023-02-03 MED ORDER — MENTHOL 3 MG MT LOZG: 1.0000 | LOZENGE | OROMUCOSAL | Status: DC | PRN

## 2023-02-03 MED ORDER — CEFAZOLIN SODIUM-DEXTROSE 2-4 GM/100ML-% IV SOLN
INTRAVENOUS | Status: AC
  Filled 2023-02-03: qty 100

## 2023-02-03 MED ORDER — OXYCODONE HCL ER 10 MG PO T12A
10.0000 mg | EXTENDED_RELEASE_TABLET | Freq: Two times a day (BID) | ORAL | Status: DC
  Administered 2023-02-03 – 2023-02-04 (×3): 10 mg via ORAL
  Filled 2023-02-03 (×3): qty 1

## 2023-02-03 MED ORDER — GABAPENTIN 300 MG PO CAPS
300.0000 mg | ORAL_CAPSULE | Freq: Once | ORAL | Status: AC
  Administered 2023-02-03: 300 mg via ORAL
  Filled 2023-02-03: qty 1

## 2023-02-03 SURGICAL SUPPLY — 69 items
ADH SKN CLS APL DERMABOND .7 (GAUZE/BANDAGES/DRESSINGS) ×1
BAG COUNTER SPONGE SURGICOUNT (BAG) ×2 IMPLANT
BAG DECANTER FOR FLEXI CONT (MISCELLANEOUS) ×2 IMPLANT
BAG SPNG CNTER NS LX DISP (BAG) ×1
BLADE SAG 18X100X1.27 (BLADE) ×2 IMPLANT
COOLER ICEMAN CLASSIC (MISCELLANEOUS) IMPLANT
COVER PERINEAL POST (MISCELLANEOUS) ×2 IMPLANT
COVER SURGICAL LIGHT HANDLE (MISCELLANEOUS) ×2 IMPLANT
CUP ACET PNNCL SECTR W/GRIP 56 (Hips) IMPLANT
DERMABOND ADVANCED .7 DNX12 (GAUZE/BANDAGES/DRESSINGS) IMPLANT
DRAPE C-ARM 42X72 X-RAY (DRAPES) ×2 IMPLANT
DRAPE POUCH INSTRU U-SHP 10X18 (DRAPES) ×2 IMPLANT
DRAPE STERI IOBAN 125X83 (DRAPES) ×2 IMPLANT
DRAPE U-SHAPE 47X51 STRL (DRAPES) ×4 IMPLANT
DRSG AQUACEL AG ADV 3.5X10 (GAUZE/BANDAGES/DRESSINGS) ×2 IMPLANT
DURAPREP 26ML APPLICATOR (WOUND CARE) ×4 IMPLANT
ELECT BLADE 4.0 EZ CLEAN MEGAD (MISCELLANEOUS) ×1
ELECT REM PT RETURN 9FT ADLT (ELECTROSURGICAL) ×1
ELECTRODE BLDE 4.0 EZ CLN MEGD (MISCELLANEOUS) ×2 IMPLANT
ELECTRODE REM PT RTRN 9FT ADLT (ELECTROSURGICAL) ×2 IMPLANT
GLOVE BIOGEL PI IND STRL 7.0 (GLOVE) ×4 IMPLANT
GLOVE BIOGEL PI IND STRL 7.5 (GLOVE) ×10 IMPLANT
GLOVE ECLIPSE 7.0 STRL STRAW (GLOVE) ×4 IMPLANT
GLOVE SKINSENSE STRL SZ7.5 (GLOVE) ×2 IMPLANT
GLOVE SURG SYN 7.5 E (GLOVE) ×2 IMPLANT
GLOVE SURG SYN 7.5 PF PI (GLOVE) ×4 IMPLANT
GLOVE SURG UNDER POLY LF SZ7 (GLOVE) ×6 IMPLANT
GLOVE SURG UNDER POLY LF SZ7.5 (GLOVE) ×4 IMPLANT
GOWN STRL REUS W/ TWL LRG LVL3 (GOWN DISPOSABLE) IMPLANT
GOWN STRL REUS W/ TWL XL LVL3 (GOWN DISPOSABLE) ×2 IMPLANT
GOWN STRL REUS W/TWL LRG LVL3 (GOWN DISPOSABLE)
GOWN STRL REUS W/TWL XL LVL3 (GOWN DISPOSABLE) ×1
GOWN STRL SURGICAL XL XLNG (GOWN DISPOSABLE) ×2 IMPLANT
GOWN TOGA ZIPPER T7+ PEEL AWAY (MISCELLANEOUS) ×4 IMPLANT
HANDPIECE INTERPULSE COAX TIP (DISPOSABLE) ×1
HEAD CERAMIC BIOLOX 32 +1 (Hips) IMPLANT
HOOD PEEL AWAY T7 (MISCELLANEOUS) ×2 IMPLANT
IV NS IRRIG 3000ML ARTHROMATIC (IV SOLUTION) ×2 IMPLANT
KIT BASIN OR (CUSTOM PROCEDURE TRAY) ×2 IMPLANT
LINER NEUT HIP ALTRX 40 56 +4 (Liner) ×1 IMPLANT
LINER NEUTRAL HIP ALTRX 40 56 (Liner) IMPLANT
MARKER SKIN DUAL TIP RULER LAB (MISCELLANEOUS) ×2 IMPLANT
NDL SPNL 18GX3.5 QUINCKE PK (NEEDLE) ×2 IMPLANT
NEEDLE SPNL 18GX3.5 QUINCKE PK (NEEDLE) ×1 IMPLANT
PACK TOTAL JOINT (CUSTOM PROCEDURE TRAY) ×2 IMPLANT
PACK UNIVERSAL I (CUSTOM PROCEDURE TRAY) ×2 IMPLANT
PAD COLD SHLDR WRAP-ON (PAD) IMPLANT
PINN SECTOR W/GRIP ACE CUP 56 (Hips) ×1 IMPLANT
SET HNDPC FAN SPRY TIP SCT (DISPOSABLE) ×2 IMPLANT
SOLUTION PRONTOSAN WOUND 350ML (IRRIGATION / IRRIGATOR) ×2 IMPLANT
STAPLER VISISTAT 35W (STAPLE) IMPLANT
STEM FEMORAL SZ 5MM STD ACTIS (Stem) IMPLANT
SUT ETHIBOND 2 V 37 (SUTURE) ×2 IMPLANT
SUT ETHILON 2 0 PSLX (SUTURE) IMPLANT
SUT VIC AB 0 CT1 27 (SUTURE) ×1
SUT VIC AB 0 CT1 27XBRD ANBCTR (SUTURE) ×2 IMPLANT
SUT VIC AB 1 CTX 36 (SUTURE) ×1
SUT VIC AB 1 CTX36XBRD ANBCTR (SUTURE) ×2 IMPLANT
SUT VIC AB 2-0 CT1 27 (SUTURE) ×2
SUT VIC AB 2-0 CT1 TAPERPNT 27 (SUTURE) ×4 IMPLANT
SYR 50ML LL SCALE MARK (SYRINGE) ×2 IMPLANT
TOWEL GREEN STERILE (TOWEL DISPOSABLE) ×2 IMPLANT
TRAY CATH INTERMITTENT SS 16FR (CATHETERS) IMPLANT
TRAY FOLEY W/BAG SLVR 16FR (SET/KITS/TRAYS/PACK) ×1
TRAY FOLEY W/BAG SLVR 16FR ST (SET/KITS/TRAYS/PACK) IMPLANT
TUBE SUCT ARGYLE STRL (TUBING) ×2 IMPLANT
WARMER LAPAROSCOPE (MISCELLANEOUS) IMPLANT
WATER STERILE IRR 1000ML POUR (IV SOLUTION) IMPLANT
YANKAUER SUCT BULB TIP NO VENT (SUCTIONS) ×2 IMPLANT

## 2023-02-03 NOTE — Plan of Care (Signed)
  Problem: Education: Goal: Knowledge of the prescribed therapeutic regimen will improve Outcome: Completed/Met Goal: Understanding of discharge needs will improve Outcome: Completed/Met Goal: Individualized Educational Video(s) Outcome: Completed/Met   Problem: Activity: Goal: Ability to avoid complications of mobility impairment will improve Outcome: Completed/Met Goal: Ability to tolerate increased activity will improve Outcome: Completed/Met   Problem: Clinical Measurements: Goal: Postoperative complications will be avoided or minimized Outcome: Completed/Met   Problem: Pain Management: Goal: Pain level will decrease with appropriate interventions Outcome: Completed/Met   Problem: Skin Integrity: Goal: Will show signs of wound healing Outcome: Completed/Met   

## 2023-02-03 NOTE — H&P (Signed)
PREOPERATIVE H&P  Chief Complaint: left hip avascular necrosis  HPI: [name] is a 62 y.o. male who presents for surgical treatment of left hip avascular necrosis.  He denies any changes in medical history.  Past Medical History:  Diagnosis Date   Chest pain    stres test neg x 2, cath 5/12; minimal LAD irregs; no obs CAD; normal LVF   Clotting disorder (HCC)    dvt   GERD (gastroesophageal reflux disease)    History of DVT of lower extremity    on chronic coumadin   Hypertension    Internal hemorrhoids    Cscope 2007   PAD (peripheral artery disease) (HCC)    a. left leg ischemia tx wtih embolectomy of left fem, pop, tib arteries with Dr. Edilia Bo - 08/2006;  b. known occlusion of right pop with collats - med Rx;  c.ABI's 8/09: R 1.0; L 0.91    PFO (patent foramen ovale)    per 08/2006 discharge summary, TEE showed EF 60%, small PFO with minimal right-to-left shunt with Valsalva; not felt to be the source of emboli, lifelong Coumadin recommended   Pneumonia    history of   Polysubstance abuse (HCC)    marijuana only   Renal infarct (HCC)    R   Past Surgical History:  Procedure Laterality Date   CHOLECYSTECTOMY     COLONOSCOPY     INGUINAL HERNIA REPAIR Left 07/16/2019   Procedure: LAPAROSCOPIC LEFT INGUINAL HERNIA REPAIR WITH MESH;  Surgeon: Axel Filler, MD;  Location: [facility] OR;  Service: General;  Laterality: Left;   left leg blood clot removal 2009  2009   TONSILLECTOMY     WRIST SURGERY Left    Social History   Socioeconomic History   Marital status: Married    Spouse name: Not on file   Number of children: 2   Years of education: Not on file   Highest education level: Not on file  Occupational History   Occupation: [employer], driver   Tobacco Use   Smoking status: Every Day    Packs/day: 1.00    Years: 30.00    Additional pack years: 0.00    Total pack years: 30.00    Types: Cigarettes   Smokeless tobacco: Never   Tobacco comments:    1 to  1.5 ppd   Vaping Use   Vaping Use: Never used  Substance and Sexual Activity   Alcohol use: Yes    Alcohol/week: 21.0 standard drinks of alcohol    Types: 21 Standard drinks or equivalent per week    Comment: 3 drinks per day, every day   Drug use: Yes    Types: Marijuana    Comment: smokes every day   Sexual activity: Yes    Partners: Female  Other Topics Concern   Not on file  Social History Narrative   1 child lives w/ them   Social Determinants of Health   Financial Resource Strain: Not on file  Food Insecurity: Not on file  Transportation Needs: Not on file  Physical Activity: Not on file  Stress: Not on file  Social Connections: Not on file   Family History  Problem Relation Age of Onset   Hypertension Mother    Breast cancer Mother    Hypertension Father    CAD Father    Diabetes Father    Lung cancer Maternal Uncle    Pancreatic cancer Brother    Prostate cancer Maternal Uncle    Heart  disease Paternal Grandmother    Colon cancer Neg Hx    Stomach cancer Neg Hx    Esophageal cancer Neg Hx    Rectal cancer Neg Hx    Allergies  Allergen Reactions   Pravastatin Itching   Simvastatin Itching   Lisinopril Swelling    Tongue swelling   Prior to Admission medications   Medication Sig Start Date End Date Taking? Authorizing Provider  acetaminophen-codeine (TYLENOL #3) 300-30 MG tablet Take 1 tablet by mouth 2 (two) times daily as needed for moderate pain. Patient not taking: Reported on 01/19/2023 12/27/22   Cristie Hem, PA-C  amLODipine (NORVASC) 5 MG tablet Take 1 tablet (5 mg total) by mouth daily. 12/19/22  Yes Paz, Nolon Rod, MD  apixaban Everlene Balls) 5 MG TABS tablet Take 1 tablet by mouth twice daily 08/11/22  Yes [name], MD  atorvastatin (LIPITOR) 40 MG tablet Take 1 tablet (40 mg total) by mouth daily. 05/27/22  Yes [name], MD  EPINEPHrine (EPIPEN 2-PAK) 0.3 mg/0.3 mL IJ SOAJ injection Inject 0.3 mg into the muscle as needed for  anaphylaxis. 12/19/22  Yes Paz, Nolon Rod, MD  famotidine (PEPCID) 40 MG tablet Take 1 tablet (40 mg total) by mouth daily. 12/19/22  Yes Paz, Nolon Rod, MD  diazepam (VALIUM) 5 MG tablet Take 1 tablet 30 to 60 minutes prior to MRI scan.  Do not operate motor vehicle while taking this medication. Patient not taking: Reported on 12/19/2022 12/09/22   Magnant, Joycie Peek, PA-C  docusate sodium (COLACE) 100 MG capsule Take 1 capsule (100 mg total) by mouth daily as needed. 01/30/23 01/30/24  Cristie Hem, PA-C  esomeprazole (NEXIUM) 20 MG packet Take 20 mg by mouth daily before breakfast.    [provider]  fexofenadine (ALLEGRA) 180 MG tablet Take 180 mg by mouth daily.    [provider]  HYDROcodone-acetaminophen (NORCO) 5-325 MG tablet Take 1 tablet by mouth 3 (three) times daily as needed. 01/26/23   Tarry Kos, MD  methocarbamol (ROBAXIN) 500 MG tablet Take 1 tablet (500 mg total) by mouth 2 (two) times daily. Patient not taking: Reported on 01/19/2023 12/14/22   Lorre Nick, MD  methocarbamol (ROBAXIN-750) 750 MG tablet Take 1 tablet (750 mg total) by mouth 2 (two) times daily as needed for muscle spasms. 01/30/23   Cristie Hem, PA-C  ondansetron (ZOFRAN) 4 MG tablet Take 1 tablet (4 mg total) by mouth every 8 (eight) hours as needed for nausea or vomiting. 01/30/23   Cristie Hem, PA-C  oxyCODONE-acetaminophen (PERCOCET) 5-325 MG tablet Take 1-2 tablets by mouth every 8 (eight) hours as needed. TO BE TAKEN AFTER SURGERY. 01/30/23   Cristie Hem, PA-C  oxyCODONE-acetaminophen (PERCOCET/ROXICET) 5-325 MG tablet Take 1 tablet by mouth every 6 (six) hours as needed for severe pain. Patient not taking: Reported on 01/19/2023 12/20/22   Magnant, Joycie Peek, PA-C  potassium chloride SA (KLOR-CON M) 20 MEQ tablet Take 1 tablet (20 mEq total) by mouth daily. 01/26/23   Tarry Kos, MD     Positive ROS: All other systems have been reviewed and were otherwise negative with the  exception of those mentioned in the HPI and as above.  Physical Exam: General: Alert, no acute distress Cardiovascular: No pedal edema Respiratory: No cyanosis, no use of accessory musculature GI: abdomen soft Skin: No lesions in the area of chief complaint Neurologic: Sensation intact distally Psychiatric: Patient is competent for consent with normal mood and affect  Lymphatic: no lymphedema  MUSCULOSKELETAL: exam stable  Assessment: left hip avascular necrosis  Plan: Plan for Procedure(s): LEFT TOTAL HIP ARTHROPLASTY ANTERIOR APPROACH  The risks benefits and alternatives were discussed with the patient including but not limited to the risks of nonoperative treatment, versus surgical intervention including infection, bleeding, nerve injury,  blood clots, cardiopulmonary complications, morbidity, mortality, among others, and they were willing to proceed.   Glee Arvin, MD 02/03/2023 6:30 AM

## 2023-02-03 NOTE — Evaluation (Signed)
Physical Therapy Evaluation Patient Details Name: [name] MRN: [id number] DOB: June 14, 1961 Today's Date: 02/03/2023  History of Present Illness  62 y.o. male presents to [facility] hospital on 02/03/2023 for elective L THA. PMH includes HTN, clotting disorder, GERD, PAD, PFO.  Clinical Impression  Pt presents to PT with deficits In strength, power, endurance, gait, balance. Pt is able to ambulate with use of RW for household distances at this time. PT provides education on THA exercise packet. Pt is encouraged to mobilize frequently in an effort to improve mobility quality and endurance. PT will follow up tomorrow for further gait training.     Recommendations for follow up therapy are one component of a multi-disciplinary discharge planning process, led by the attending physician.  Recommendations may be updated based on patient status, additional functional criteria and insurance authorization.  Follow Up Recommendations       Assistance Recommended at Discharge PRN  Patient can return home with the following  A little help with bathing/dressing/bathroom;Assistance with cooking/housework;Assist for transportation;Help with stairs or ramp for entrance    Equipment Recommendations None recommended by PT  Recommendations for Other Services       Functional Status Assessment Patient has had a recent decline in their functional status and demonstrates the ability to make significant improvements in function in a reasonable and predictable amount of time.     Precautions / Restrictions Precautions Precautions: Fall Precaution Comments: direct anterior THA Restrictions Weight Bearing Restrictions: Yes LLE Weight Bearing: Weight bearing as tolerated      Mobility  Bed Mobility Overal bed mobility: Needs Assistance Bed Mobility: Supine to Sit, Sit to Supine     Supine to sit: Supervision Sit to supine: Supervision        Transfers Overall transfer level: Needs  assistance Equipment used: Rolling walker (2 wheels), None Transfers: Sit to/from Stand Sit to Stand: Supervision                Ambulation/Gait Ambulation/Gait assistance: Supervision Gait Distance (Feet): 100 Feet Assistive device: Rolling walker (2 wheels) Gait Pattern/deviations: Step-through pattern Gait velocity: reduced Gait velocity interpretation: <1.8 ft/sec, indicate of risk for recurrent falls   General Gait Details: slowed step-through gait, reduced stance time on LLE  Stairs            Wheelchair Mobility    Modified Rankin (Stroke Patients Only)       Balance Overall balance assessment: Needs assistance Sitting-balance support: No upper extremity supported, Feet supported Sitting balance-Leahy Scale: Good     Standing balance support: Bilateral upper extremity supported, Reliant on assistive device for balance Standing balance-Leahy Scale: Poor                               Pertinent Vitals/Pain Pain Assessment Pain Assessment: 0-10 Pain Score: 5  Pain Location: L hip Pain Descriptors / Indicators: Sore Pain Intervention(s): Premedicated before session    Home Living Family/patient expects to be discharged to:: Private residence Living Arrangements: Spouse/significant other Available Help at Discharge: Family Type of Home: House Home Access: Ramped entrance       Home Layout: One level Home Equipment: Agricultural consultant (2 wheels);Cane - single point      Prior Function Prior Level of Function : Independent/Modified Independent;Working/employed;Driving             Mobility Comments: ambulating with a RW for last 2 months       Hand Dominance  Extremity/Trunk Assessment   Upper Extremity Assessment Upper Extremity Assessment: Overall WFL for tasks assessed    Lower Extremity Assessment Lower Extremity Assessment: LLE deficits/detail LLE Deficits / Details: generalized weakness    Cervical / Trunk  Assessment Cervical / Trunk Assessment: Normal  Communication   Communication: No difficulties  Cognition Arousal/Alertness: Awake/alert Behavior During Therapy: WFL for tasks assessed/performed Overall Cognitive Status: Within Functional Limits for tasks assessed                                          General Comments General comments (skin integrity, edema, etc.): VSS on RA    Exercises     Assessment/Plan    PT Assessment Patient needs continued PT services  PT Problem List Decreased strength;Decreased activity tolerance;Decreased balance;Decreased mobility;Decreased knowledge of use of DME;Pain       PT Treatment Interventions DME instruction;Gait training;Functional mobility training;Therapeutic activities;Therapeutic exercise;Balance training;Neuromuscular re-education;Patient/family education    PT Goals (Current goals can be found in the Care Plan section)  Acute Rehab PT Goals Patient Stated Goal: to return to work PT Goal Formulation: With patient Time For Goal Achievement: 02/07/23 Potential to Achieve Goals: Good    Frequency 7X/week     Co-evaluation               AM-PAC PT "6 Clicks" Mobility  Outcome Measure Help needed turning from your back to your side while in a flat bed without using bedrails?: A Little Help needed moving from lying on your back to sitting on the side of a flat bed without using bedrails?: A Little Help needed moving to and from a bed to a chair (including a wheelchair)?: A Little Help needed standing up from a chair using your arms (e.g., wheelchair or bedside chair)?: A Little Help needed to walk in hospital room?: A Little Help needed climbing 3-5 steps with a railing? : Total 6 Click Score: 16    End of Session   Activity Tolerance: Patient tolerated treatment well Patient left: in bed;with call bell/phone within reach Nurse Communication: Mobility status PT Visit Diagnosis: Other abnormalities of  gait and mobility (R26.89);Muscle weakness (generalized) (M62.81)    Time: 1610-9604 PT Time Calculation (min) (ACUTE ONLY): 22 min   Charges:   PT Evaluation $PT Eval Low Complexity: 1 Low          Arlyss Gandy, PT, DPT Acute Rehabilitation Office [phone]   Arlyss Gandy 02/03/2023, 5:10 PM

## 2023-02-03 NOTE — Plan of Care (Signed)
  Problem: Education: Goal: Knowledge of the prescribed therapeutic regimen will improve Outcome: Completed/Met Goal: Understanding of discharge needs will improve Outcome: Completed/Met Goal: Individualized Educational Video(s) Outcome: Completed/Met   Problem: Activity: Goal: Ability to avoid complications of mobility impairment will improve Outcome: Completed/Met Goal: Ability to tolerate increased activity will improve Outcome: Completed/Met   Problem: Clinical Measurements: Goal: Postoperative complications will be avoided or minimized Outcome: Completed/Met   Problem: Skin Integrity: Goal: Will show signs of wound healing Outcome: Completed/Met

## 2023-02-03 NOTE — Op Note (Signed)
LEFT TOTAL HIP ARTHROPLASTY ANTERIOR APPROACH  Procedure Note JACAI COSSAIRT   [id number]  Pre-op Diagnosis: left hip avascular necrosis     Post-op Diagnosis: same  Operative Findings Coxa vara Complete loss of joint space Joint effusion and severe synovitis   Operative Procedures  1. Total hip replacement; Left hip; uncemented [id number]   Surgeon: Gershon Mussel, M.D.  Assist: Oneal Grout, PA-C   Anesthesia: spinal  Prosthesis: Depuy Acetabulum: Pinnacle 56 mm Femur: Actis 5 STD Head: 40 mm size: +1.5 Liner: +4 Bearing Type: ceramic/poly  Total Hip Arthroplasty (Anterior Approach) Op Note:  After informed consent was obtained and the operative extremity marked in the holding area, the patient was brought back to the operating room and placed supine on the HANA table. Next, the operative extremity was prepped and draped in normal sterile fashion. Surgical timeout occurred verifying patient identification, surgical site, surgical procedure and administration of antibiotics.  A Hueter approach to the hip was performed, using the interval between tensor fascia lata and sartorius.  Dissection was carried bluntly down onto the anterior hip capsule. The lateral femoral circumflex vessels were identified and coagulated. A capsulotomy was performed and the capsular flaps tagged for later repair.  The neck osteotomy was performed. The femoral head was removed which showed severe wear, the acetabular rim was cleared of soft tissue and osteophytes and attention was turned to reaming the acetabulum.  Sequential reaming was performed under fluoroscopic guidance down to the floor of the cotyloid fossa. We reamed to a size 55 mm, and then impacted the acetabular shell.  The liner was then placed after irrigation and attention turned to the femur.  After placing the femoral hook, the leg was taken to externally rotated, extended and adducted position taking care to perform soft tissue  releases to allow for adequate mobilization of the femur. Soft tissue was cleared from the shoulder of the greater trochanter and the hook elevator used to improve exposure of the proximal femur. Sequential broaching performed up to a size 5. Trial neck and head were placed. The leg was brought back up to neutral and the construct reduced.  Antibiotic irrigation was placed in the surgical wound.  The position and sizing of components, offset and leg lengths were checked using fluoroscopy. Stability of the construct was checked in extension and external rotation without any subluxation, shuck or impingement of prosthesis. We dislocated the prosthesis, dropped the leg back into position, removed trial components, and irrigated copiously. The final stem and head was then placed, the leg brought back up, the system reduced and fluoroscopy used to verify positioning.  We irrigated, obtained hemostasis and closed the capsule using #2 ethibond suture.  One gram of vancomycin powder was placed in the surgical bed.   One gram of topical tranexamic acid was injected into the joint.  The fascia was closed with #1 vicryl plus, the deep fat layer was closed with 0 vicryl, the subcutaneous layers closed with 2.0 Vicryl Plus and the skin closed with 2.0 nylon and dermabond. A sterile dressing was applied. The patient was awakened in the operating room and taken to recovery in stable condition.  All sponge, needle, and instrument counts were correct at the end of the case.   Tessa Lerner, my PA, was a medical necessity for opening, closing, limb positioning, retracting, exposing, and overall facilitation and timely completion of the surgery.  Position: supine  Complications: see description of procedure.  Time Out: performed   Drains/Packing: none  Estimated blood loss: see anesthesia record  Returned to Recovery Room: in good condition.   Antibiotics: yes   Mechanical VTE (DVT) Prophylaxis: sequential  compression devices, TED thigh-high  Chemical VTE (DVT) Prophylaxis: aspirin POD 0, resume eliquis POD 1   Fluid Replacement: see anesthesia record  Specimens Removed: 1 to pathology   Sponge and Instrument Count Correct? yes   PACU: portable radiograph - low AP   Plan/RTC: Return in 2 weeks for staple removal. Weight Bearing/Load Lower Extremity: full  Hip precautions: none Suture Removal: 2 weeks   N. Glee Arvin, MD [facility] 8:48 AM   Implant Name Type Inv. Item Serial No. Manufacturer Lot No. LRB No. Used Action  PINN SECTOR W/GRIP ACE CUP 56 - [id number] Hips PINN SECTOR W/GRIP ACE CUP 56  [employer] [id number] Left 1 Implanted  LINER NEUT HIP ALTRX 40 56 +4 - [id number] Liner LINER NEUT HIP ALTRX 40 56 +4  [employer] [id number] Left 1 Implanted  STEM FEMORAL SZ STD ACTIS - [id number] Stem STEM FEMORAL SZ STD ACTIS  [employer]PU[employer] [id number] Left 1 Implanted  HEAD CERAMIC BIOLOX 32 +1 - [id number] Hips HEAD CERAMIC BIOLOX 32 +1  [employer] [id number] Left 1 Implanted

## 2023-02-03 NOTE — Anesthesia Procedure Notes (Signed)
Spinal  Patient location during procedure: OR Start time: 02/03/2023 7:20 AM End time: 02/03/2023 7:25 AM Reason for block: surgical anesthesia Staffing Performed: anesthesiologist  Anesthesiologist: Lewie Loron, MD Performed by: Lewie Loron, MD Authorized by: Lewie Loron, MD   Preanesthetic Checklist Completed: patient identified, IV checked, site marked, risks and benefits discussed, surgical consent, monitors and equipment checked, pre-op evaluation and timeout performed Spinal Block Patient position: sitting Prep: DuraPrep and site prepped and draped Patient monitoring: heart rate, continuous pulse ox and blood pressure Approach: right paramedian Location: L2-3 Injection technique: single-shot Needle Needle type: Spinocan  Needle gauge: 25 G Needle length: 9 cm Additional Notes Expiration date of kit checked and confirmed. Patient tolerated procedure well, without complications.

## 2023-02-03 NOTE — Discharge Instructions (Signed)

## 2023-02-03 NOTE — Transfer of Care (Signed)
Immediate Anesthesia Transfer of Care Note  Patient: [name]  Procedure(s) Performed: LEFT TOTAL HIP ARTHROPLASTY ANTERIOR APPROACH (Left: Hip)  Patient Location: PACU  Anesthesia Type:MAC and Spinal  Level of Consciousness: awake  Airway & Oxygen Therapy: Patient Spontanous Breathing  Post-op Assessment: Report given to RN and Post -op Vital signs reviewed and stable  Post vital signs: Reviewed and stable  Last Vitals:  Vitals Value Taken Time  BP 117/71 02/03/23 0917  Temp    Pulse 72 02/03/23 0920  Resp 20 02/03/23 0920  SpO2 97 % 02/03/23 0920  Vitals shown include unvalidated device data.  Last Pain:  Vitals:   02/03/23 0635  TempSrc:   PainSc: 7          Complications: No notable events documented.

## 2023-02-03 NOTE — TOC Initial Note (Signed)
Transition of Care [facility]) - Initial/Assessment Note    Patient Details  Name: [name] MRN: [id number] Date of Birth: Jun 04, 1961  Transition of [facility]) CM/SW Contact:    Kermit Balo, RN Phone Number: 02/03/2023, 4:30 PM  Clinical Narrative:                 Pt s/p surgery. Pt with orders for [facility] services. HH arranged with [facility]. Information on the AVS.  Any needed DME will be obtained by bedside RN.  Pt has transportation home when medically ready.  Expected Discharge Plan: Home w Home Health Services Barriers to Discharge: Continued Medical Work up   Patient Goals and CMS Choice   CMS Medicare.gov Compare Post Acute Care list provided to:: Patient Choice offered to / list presented to : Patient      Expected Discharge Plan and Services   Discharge Planning Services: CM Consult Post Acute Care Choice: Home Health Living arrangements for the past 2 months: Single Family Home                           HH Arranged: PT HH Agency: [facility] Date [facility] Agency Contacted: 02/03/23   Representative spoke with at [facility] Agency: Kandee Keen  Prior Living Arrangements/Services Living arrangements for the past 2 months: Single Family Home Lives with:: Spouse   Do you feel safe going back to the place where you live?: Yes               Activities of Daily Living      Permission Sought/Granted                  Emotional Assessment Appearance:: Appears stated age Attitude/Demeanor/Rapport: Engaged Affect (typically observed): Accepting Orientation: : Oriented to Self, Oriented to Place, Oriented to  Time, Oriented to Situation   Psych Involvement: No (comment)  Admission diagnosis:  Status post total replacement of left hip [Z96.642] Patient Active Problem List   Diagnosis Date Noted   Status post total replacement of left hip 02/03/2023   Primary osteoarthritis of left hip 02/02/2023   Avascular necrosis of bone of left hip (HCC)  12/23/2022   High risk heterosexual behavior 02/05/2021   History of colonic polyps 11/04/2019   PCP NOTES >>>>>>>>>>>>>> 07/26/2016   GERD (gastroesophageal reflux disease) 07/26/2016   Annual physical exam 07/25/2016   HTN (hypertension) 09/16/2014   Nausea without vomiting 11/29/2013   Deep vein thrombosis (HCC) 10/06/2010   Long term current use of anticoagulant 10/06/2010   Renal infarct (HCC) 10/06/2010   Dyslipidemia 09/03/2009   CHEST PAIN-UNSPECIFIED 09/03/2009   SHOULDER PAIN, RIGHT 03/02/2009   PERIPHERAL VASCULAR DISEASE 02/11/2009   SKIN LESION 02/04/2008   Polysubstance abuse (HCC) 05/28/2007   DISORDER, VASCULAR, KIDNEY 05/28/2007   DVT, HX OF 12/25/2006   PCP:  Wanda Plump, MD Pharmacy:   [facility] [address] [place], [place] - [address] [place] [address] Phone: [phone] Fax: [phone]  [facility] [address] [place], [place] - [address] [place] [address] Phone: [phone] Fax: [phone]     Social Determinants of Health (SDOH) Social History: SDOH Screenings   Depression (PHQ2-9): Low Risk  (12/19/2022)  Tobacco Use: High Risk (02/03/2023)   SDOH Interventions:     Readmission Risk Interventions     No data to display

## 2023-02-04 DIAGNOSIS — M87852 Other osteonecrosis, left femur: Secondary | ICD-10-CM | POA: Diagnosis not present

## 2023-02-04 LAB — CBC
HCT: 36.7 % — ABNORMAL LOW (ref 39.0–52.0)
Hemoglobin: 12.5 g/dL — ABNORMAL LOW (ref 13.0–17.0)
MCH: 32.6 pg (ref 26.0–34.0)
MCHC: 34.1 g/dL (ref 30.0–36.0)
MCV: 95.6 fL (ref 80.0–100.0)
Platelets: 260 10*3/uL (ref 150–400)
RBC: 3.84 MIL/uL — ABNORMAL LOW (ref 4.22–5.81)
RDW: 14.1 % (ref 11.5–15.5)
WBC: 15.7 10*3/uL — ABNORMAL HIGH (ref 4.0–10.5)
nRBC: 0 % (ref 0.0–0.2)

## 2023-02-04 NOTE — Progress Notes (Signed)
Patient alert and oriented, voiding adequately, skin clean, dry and intact without evidence of skin break down, or symptoms of complications - no redness or edema noted, only slight tenderness at site.  Patient states pain is manageable at time of discharge. Patient has an appointment with MD in 2 weeks 

## 2023-02-04 NOTE — Progress Notes (Signed)
Physical Therapy Treatment Patient Details Name: [name] MRN: [id number] DOB: 11-Dec-1960 Today's Date: 02/04/2023   History of Present Illness 62 y.o. male presents to [facility] hospital on 02/03/2023 for elective L THA. PMH includes HTN, clotting disorder, GERD, PAD, PFO.    PT Comments    Pt tolerates treatment well, ambulating for increased distances and with improved posture. Pt is encouraged to mobilize frequently in an effort to continue improving activity tolerance. Pt denies concerns about mobility or HEP at this time and is eager to discharge home.   Recommendations for follow up therapy are one component of a multi-disciplinary discharge planning process, led by the attending physician.  Recommendations may be updated based on patient status, additional functional criteria and insurance authorization.  Follow Up Recommendations       Assistance Recommended at Discharge PRN  Patient can return home with the following A little help with bathing/dressing/bathroom;Assistance with cooking/housework;Assist for transportation;Help with stairs or ramp for entrance   Equipment Recommendations  None recommended by PT    Recommendations for Other Services       Precautions / Restrictions Precautions Precautions: Fall Precaution Comments: direct anterior THA Restrictions Weight Bearing Restrictions: Yes LLE Weight Bearing: Weight bearing as tolerated     Mobility  Bed Mobility Overal bed mobility: Modified Independent                  Transfers Overall transfer level: Modified independent Equipment used: Rolling walker (2 wheels) Transfers: Sit to/from Stand Sit to Stand: Modified independent (Device/Increase time)                Ambulation/Gait Ambulation/Gait assistance: Supervision Gait Distance (Feet): 200 Feet Assistive device: Rolling walker (2 wheels) Gait Pattern/deviations: Step-through pattern Gait velocity: functional Gait velocity  interpretation: 1.31 - 2.62 ft/sec, indicative of limited community ambulator   General Gait Details: slowed step-through gait, improved posture with reduced hip flexion   Stairs             Wheelchair Mobility    Modified Rankin (Stroke Patients Only)       Balance Overall balance assessment: Needs assistance Sitting-balance support: No upper extremity supported, Feet supported Sitting balance-Leahy Scale: Good     Standing balance support: No upper extremity supported, During functional activity Standing balance-Leahy Scale: Fair                              Cognition Arousal/Alertness: Awake/alert Behavior During Therapy: WFL for tasks assessed/performed Overall Cognitive Status: Within Functional Limits for tasks assessed                                          Exercises      General Comments General comments (skin integrity, edema, etc.): VSS on RA      Pertinent Vitals/Pain Pain Assessment Pain Assessment: Faces Faces Pain Scale: Hurts little more Pain Location: L hip Pain Descriptors / Indicators: Sore Pain Intervention(s): Monitored during session    Home Living                          Prior Function            PT Goals (current goals can now be found in the care plan section) Acute Rehab PT Goals Patient Stated Goal: to return to  work Progress towards PT goals: Progressing toward goals    Frequency    7X/week      PT Plan Current plan remains appropriate    Co-evaluation              AM-PAC PT "6 Clicks" Mobility   Outcome Measure  Help needed turning from your back to your side while in a flat bed without using bedrails?: None Help needed moving from lying on your back to sitting on the side of a flat bed without using bedrails?: None Help needed moving to and from a bed to a chair (including a wheelchair)?: None Help needed standing up from a chair using your arms (e.g.,  wheelchair or bedside chair)?: None Help needed to walk in hospital room?: A Little Help needed climbing 3-5 steps with a railing? : A Little 6 Click Score: 22    End of Session   Activity Tolerance: Patient tolerated treatment well Patient left: in bed;with call bell/phone within reach Nurse Communication: Mobility status PT Visit Diagnosis: Other abnormalities of gait and mobility (R26.89);Muscle weakness (generalized) (M62.81)     Time: 1610-9604 PT Time Calculation (min) (ACUTE ONLY): 15 min  Charges:  $Gait Training: 8-22 mins                     Arlyss Gandy, PT, DPT Acute Rehabilitation Office [phone]    Arlyss Gandy 02/04/2023, 10:07 AM

## 2023-02-04 NOTE — Discharge Summary (Signed)
Patient ID: [name] MRN: [id number] DOB/AGE: [birth year] 60 y.o.  Admit date: 02/03/2023 Discharge date: 02/04/2023  Admission Diagnoses:  Primary osteoarthritis of left hip  Discharge Diagnoses:  Principal Problem:   Primary osteoarthritis of left hip Active Problems:   Status post total replacement of left hip   Past Medical History:  Diagnosis Date   Chest pain    stres test neg x 2, cath 5/12; minimal LAD irregs; no obs CAD; normal LVF   Clotting disorder (HCC)    dvt   GERD (gastroesophageal reflux disease)    History of DVT of lower extremity    on chronic coumadin   Hypertension    Internal hemorrhoids    Cscope 2007   PAD (peripheral artery disease) (HCC)    a. left leg ischemia tx wtih embolectomy of left fem, pop, tib arteries with Dr. Edilia Bo - 08/2006;  b. known occlusion of right pop with collats - med Rx;  c.ABI's 8/09: R 1.0; L 0.91    PFO (patent foramen ovale)    per 08/2006 discharge summary, TEE showed EF 60%, small PFO with minimal right-to-left shunt with Valsalva; not felt to be the source of emboli, lifelong Coumadin recommended   Pneumonia    history of   Polysubstance abuse (HCC)    marijuana only   Renal infarct (HCC)    R    Surgeries: Procedure(s): LEFT TOTAL HIP ARTHROPLASTY ANTERIOR APPROACH on 02/03/2023   Consultants (if any):   Discharged Condition: Improved  Hospital Course: [name] is an 62 y.o. male who was admitted 02/03/2023 with a diagnosis of Primary osteoarthritis of left hip and went to the operating room on 02/03/2023 and underwent the above named procedures.    He was given perioperative antibiotics:  Anti-infectives (From admission, onward)    Start     Dose/Rate Route Frequency Ordered Stop   02/03/23 1400  ceFAZolin (ANCEF) IVPB 2g/100 mL premix        2 g 200 mL/hr over 30 Minutes Intravenous Every 6 hours 02/03/23 1108 02/03/23 2046   02/03/23 0807  vancomycin (VANCOCIN) powder  Status:   Discontinued          As needed 02/03/23 0807 02/03/23 0914   02/03/23 0630  ceFAZolin (ANCEF) IVPB 2g/100 mL premix        2 g 200 mL/hr over 30 Minutes Intravenous On call to O.R. 02/03/23 0622 02/03/23 0756   02/03/23 0623  ceFAZolin (ANCEF) 2-4 GM/100ML-% IVPB       Note to Pharmacy: Darrick Huntsman: cabinet override      02/03/23 0623 02/03/23 0745     .  He was given sequential compression devices, early ambulation, and appropriate chemoprophylaxis for DVT prophylaxis.  He benefited maximally from the hospital stay and there were no complications.    Recent vital signs:  Vitals:   02/04/23 0502 02/04/23 0750  BP: (!) 134/96 (!) 138/95  Pulse: 80 81  Resp: 16 20  Temp: 98.8 F (37.1 C) 98 F (36.7 C)  SpO2: 100% 100%    Recent laboratory studies:  Lab Results  Component Value Date   HGB 12.5 (L) 02/04/2023   HGB 13.6 01/26/2023   HGB 13.3 11/24/2021   Lab Results  Component Value Date   WBC 15.7 (H) 02/04/2023   PLT 260 02/04/2023   Lab Results  Component Value Date   INR 1.1 (H) 11/24/2021   Lab Results  Component Value Date   NA 137  01/26/2023   K 3.1 (L) 01/26/2023   CL 99 01/26/2023   CO2 28 01/26/2023   BUN 14 01/26/2023   CREATININE 0.75 01/26/2023   GLUCOSE 98 01/26/2023    Discharge Medications:   Allergies as of 02/04/2023       Reactions   Pravastatin Itching   Simvastatin Itching   Lisinopril Swelling   Tongue swelling        Medication List     STOP taking these medications    acetaminophen-codeine 300-30 MG tablet Commonly known as: TYLENOL #3   diazepam 5 MG tablet Commonly known as: Valium   HYDROcodone-acetaminophen 5-325 MG tablet Commonly known as: Norco   potassium chloride SA 20 MEQ tablet Commonly known as: KLOR-CON M       TAKE these medications    amLODipine 5 MG tablet Commonly known as: NORVASC Take 1 tablet (5 mg total) by mouth daily.   atorvastatin 40 MG tablet Commonly known as:  LIPITOR Take 1 tablet (40 mg total) by mouth daily.   docusate sodium 100 MG capsule Commonly known as: Colace Take 1 capsule (100 mg total) by mouth daily as needed.   Eliquis 5 MG Tabs tablet Generic drug: apixaban Take 1 tablet by mouth twice daily   EPINEPHrine 0.3 mg/0.3 mL Soaj injection Commonly known as: EpiPen 2-Pak Inject 0.3 mg into the muscle as needed for anaphylaxis.   esomeprazole 20 MG packet Commonly known as: NEXIUM Take 20 mg by mouth daily before breakfast.   famotidine 40 MG tablet Commonly known as: Pepcid Take 1 tablet (40 mg total) by mouth daily.   fexofenadine 180 MG tablet Commonly known as: ALLEGRA Take 180 mg by mouth daily.   methocarbamol 750 MG tablet Commonly known as: Robaxin-750 Take 1 tablet (750 mg total) by mouth 2 (two) times daily as needed for muscle spasms. What changed: Another medication with the same name was removed. Continue taking this medication, and follow the directions you see here.   ondansetron 4 MG tablet Commonly known as: Zofran Take 1 tablet (4 mg total) by mouth every 8 (eight) hours as needed for nausea or vomiting.   oxyCODONE-acetaminophen 5-325 MG tablet Commonly known as: Percocet Take 1-2 tablets by mouth every 8 (eight) hours as needed. TO BE TAKEN AFTER SURGERY. What changed: Another medication with the same name was removed. Continue taking this medication, and follow the directions you see here.        Diagnostic Studies: DG Pelvis Portable  Result Date: 02/03/2023 CLINICAL DATA:  [address] Hip joint replacement status [id number] EXAM: PORTABLE PELVIS 1-2 VIEWS COMPARISON:  10/24/2022 FINDINGS: Interval postsurgical changes from left total hip arthroplasty. Arthroplasty components are in their expected alignment. No periprosthetic fracture or evidence of other complication. Expected postoperative changes within the overlying soft tissues. IMPRESSION: Satisfactory postoperative appearance of left total hip  arthroplasty. Electronically Signed   By: Duanne Guess D.O.   On: 02/03/2023 10:14   DG HIP UNILAT WITH PELVIS 1V LEFT  Result Date: 02/03/2023 CLINICAL DATA:  [id number] Elective surgery [id number] EXAM: DG HIP (WITH OR WITHOUT PELVIS) 1V*L* COMPARISON:  10/24/2022 FINDINGS: Four C-arm fluoroscopic images were obtained intraoperatively and submitted for post operative interpretation. Images demonstrate placement of left total hip arthroplasty hardware. 22 seconds of fluoroscopy time utilized. Radiation dose: 2.51 mGy. Please see the performing provider's procedural report for further detail. IMPRESSION: Fluoroscopic guidance provided for left total hip arthroplasty. Electronically Signed   By: Duanne Guess D.O.   On: 02/03/2023  10:13   DG C-Arm 1-60 Min-No Report  Result Date: 02/03/2023 Fluoroscopy was utilized by the requesting physician.  No radiographic interpretation.   DG C-Arm 1-60 Min-No Report  Result Date: 02/03/2023 Fluoroscopy was utilized by the requesting physician.  No radiographic interpretation.    Disposition: Discharge disposition: 01-Home or Self Care          Follow-up Information     Cristie Hem, PA-C. Schedule an appointment as soon as possible for a visit in 2 week(s).   Specialty: Orthopedic Surgery Contact information: [address] [place] [address] [phone]         Care, [facility] Follow up.   Specialty: Home Health Services Why: The home health agency will contact you for the first home visit. Contact information: [address] [place] [address] [phone]                  Signed: Marcquez Vizzi 02/04/2023, 1:40 PM

## 2023-02-04 NOTE — TOC Transition Note (Signed)
Transition of Care [facility]) - CM/SW Discharge Note   Patient Details  Name: [name] MRN: [id number] Date of Birth: 01-25-1961  Transition of [facility]) CM/SW Contact:  Ronny Bacon, RN Phone Number: 02/04/2023, 12:14 PM   Clinical Narrative:   Patient being discharged home today. [facility] confirmed that patient is under their service for [facility] PT. DME supplied by unit staff.    Final next level of care: Home w Home Health Services Barriers to Discharge: No Barriers Identified   Patient Goals and CMS Choice CMS Medicare.gov Compare Post Acute Care list provided to:: Patient Choice offered to / list presented to : Patient  Discharge Placement                         Discharge Plan and Services Additional resources added to the After Visit Summary for     Discharge Planning Services: CM Consult Post Acute Care Choice: Home Health                    HH Arranged: PT [facility] Agency: [facility] Care Date [facility] Agency Contacted: 02/03/23   Representative spoke with at [facility] Agency: Kandee Keen  Social Determinants of Health (SDOH) Interventions SDOH Screenings   Depression (PHQ2-9): Low Risk  (12/19/2022)  Tobacco Use: High Risk (02/03/2023)     Readmission Risk Interventions     No data to display

## 2023-02-06 ENCOUNTER — Telehealth: Payer: Self-pay | Admitting: Orthopaedic Surgery

## 2023-02-06 ENCOUNTER — Other Ambulatory Visit: Payer: Self-pay | Admitting: Physician Assistant

## 2023-02-06 ENCOUNTER — Telehealth: Payer: Self-pay

## 2023-02-06 NOTE — Transitions of Care (Post Inpatient/ED Visit) (Signed)
02/06/2023  Name: [name] MRN: [id number] DOB: 12-11-1960  Today's TOC FU Call Status: Today's TOC FU Call Status:: Successful TOC FU Call Competed TOC FU Call Complete Date: 02/06/23  Transition Care Management Follow-up Telephone Call Date of Discharge: 02/04/23 Discharge Facility: [facility]) Type of Discharge: Inpatient Admission Primary Inpatient Discharge Diagnosis:: total left hip replacement How have you been since you were released from the hospital?: Better Any questions or concerns?: No  Items Reviewed: Did you receive and understand the discharge instructions provided?: Yes Medications obtained,verified, and reconciled?: Yes (Medications Reviewed) Any new allergies since your discharge?: No Dietary orders reviewed?: Yes Do you have support at home?: Yes People in Home: spouse  Medications Reviewed Today: Medications Reviewed Today     Reviewed by Karena Addison, LPN (Licensed Practical Nurse) on 02/06/23 at 1108  Med List Status: <None>   Medication Order Taking? Sig Documenting Provider Last Dose Status Informant  amLODipine (NORVASC) 5 MG tablet [id number] No Take 1 tablet (5 mg total) by mouth daily. Wanda Plump, MD 02/02/2023 Active Self  apixaban (ELIQUIS) 5 MG TABS tablet [id number] No Take 1 tablet by mouth twice daily [name], MD 01/30/2023 Active Self  atorvastatin (LIPITOR) 40 MG tablet [id number] No Take 1 tablet (40 mg total) by mouth daily. [name], MD 02/02/2023 Active Self  docusate sodium (COLACE) 100 MG capsule [id number] No Take 1 capsule (100 mg total) by mouth daily as needed. Cristie Hem, PA-C More than a month Active   EPINEPHrine (EPIPEN 2-PAK) 0.3 mg/0.3 mL IJ SOAJ injection [id number] No Inject 0.3 mg into the muscle as needed for anaphylaxis. Wanda Plump, MD Taking Active Self  esomeprazole (NEXIUM) 20 MG packet [id number] No Take 20 mg by mouth daily before breakfast. [provider] Past Month Active  Self           Med Note Joella Prince A   Thu Jan 19, 2023  3:50 PM) Pt ran out, need to get more  famotidine (PEPCID) 40 MG tablet [id number] No Take 1 tablet (40 mg total) by mouth daily. Wanda Plump, MD Past Month Active Self  fexofenadine (ALLEGRA) 180 MG tablet [id number] No Take 180 mg by mouth daily. [provider] Past Month Active Self           Med Note Georgann Housekeeper Jan 19, 2023  3:51 PM) Pt ran out, needs to get more  methocarbamol (ROBAXIN-750) 750 MG tablet [id number] No Take 1 tablet (750 mg total) by mouth 2 (two) times daily as needed for muscle spasms. Cristie Hem, PA-C 02/02/2023 Active   ondansetron (ZOFRAN) 4 MG tablet [id number]  Take 1 tablet (4 mg total) by mouth every 8 (eight) hours as needed for nausea or vomiting. Cristie Hem, PA-C  Active   oxyCODONE-acetaminophen (PERCOCET) 5-325 MG tablet [id number]  Take 1-2 tablets by mouth every 8 (eight) hours as needed. TO BE TAKEN AFTER SURGERY. Cristie Hem, PA-C  Active             Home Care and Equipment/Supplies: Were Home Health Services Ordered?: NA Any new equipment or medical supplies ordered?: NA  Functional Questionnaire: Do you need assistance with bathing/showering or dressing?: Yes Do you need assistance with meal preparation?: No Do you need assistance with eating?: No Do you have difficulty maintaining continence: No Do you need assistance with getting out of bed/getting out of a chair/moving?: Yes Do you have difficulty managing or  taking your medications?: No  Follow up appointments reviewed: PCP Follow-up appointment confirmed?: NA Specialist Hospital Follow-up appointment confirmed?: Yes Date of Specialist follow-up appointment?: 02/17/23 Follow-Up Specialty Provider:: ortho Do you need transportation to your follow-up appointment?: No Do you understand care options if your condition(s) worsen?: Yes-patient verbalized understanding    SIGNATURE Karena Addison,  LPN [facility] Nurse Health Advisor Direct Dial [phone]

## 2023-02-06 NOTE — Telephone Encounter (Signed)
Patient is in a lot of pain today; states he does not feel like the instructions for the medication will suffice the amount of pain he Is in. Would like to speak to someone regarding this, possibly getting something stronger or being able to take more frequently. Call back # [phone]

## 2023-02-06 NOTE — Telephone Encounter (Signed)
See previous message

## 2023-02-06 NOTE — Anesthesia Postprocedure Evaluation (Signed)
Anesthesia Post Note  Patient: [name]  Procedure(s) Performed: LEFT TOTAL HIP ARTHROPLASTY ANTERIOR APPROACH (Left: Hip)     Patient location during evaluation: PACU Anesthesia Type: Spinal Level of consciousness: sedated and patient cooperative Pain management: pain level controlled Vital Signs Assessment: post-procedure vital signs reviewed and stable Respiratory status: spontaneous breathing Cardiovascular status: stable Anesthetic complications: no   No notable events documented.  Last Vitals:  Vitals:   02/04/23 0502 02/04/23 0750  BP: (!) 134/96 (!) 138/95  Pulse: 80 81  Resp: 16 20  Temp: 37.1 C 36.7 C  SpO2: 100% 100%    Last Pain:  Vitals:   02/04/23 1232  TempSrc:   PainSc: 3                  Lewie Loron

## 2023-02-06 NOTE — Telephone Encounter (Signed)
Pt called in stating pain meds are not working and he does not want to take more that he is supposed to but he is unsure of what to do, call sent to Triage

## 2023-02-06 NOTE — Telephone Encounter (Signed)
Notified patient.

## 2023-02-06 NOTE — Telephone Encounter (Signed)
Increase pain meds to 1-2 every 6 hours prn.  When he needs a refill, he will need to let us know of a diff pharmacy other than [facility] so that we can write same way if needed.  Cannot send in nsaids as he is on eliquis

## 2023-02-07 ENCOUNTER — Telehealth: Payer: Self-pay | Admitting: Orthopaedic Surgery

## 2023-02-07 ENCOUNTER — Telehealth: Payer: Self-pay | Admitting: Internal Medicine

## 2023-02-07 NOTE — Telephone Encounter (Signed)
Pt states he recently had hip surgery and has not had a bowel movement since Friday. He has tried otc options and tried to get in touch with his surgeon but has not has any luck. He is hoping pcp is able to recommend anything. Please advise.   [facility] [address] [place], [place] - [address], [place] [address] Phone: [phone]  Fax: [phone]

## 2023-02-07 NOTE — Telephone Encounter (Signed)
Patient called. Says he has not had a bowel movement since last Friday. Says nothing OTC is working. Would like to know what else he can take? His CB# [phone]

## 2023-02-07 NOTE — Telephone Encounter (Signed)
Please advise 

## 2023-02-08 NOTE — Telephone Encounter (Signed)
If nausea, vomiting or abdominal pain: Go to the emergency room. Otherwise: Good hydration MiraLAX 17 g every day mixed with water, every day until he has a BM As long as he does not have stomach pain he could use also Fleet enema. If he is taking any pain medication should minimize it.

## 2023-02-08 NOTE — Telephone Encounter (Signed)
Spoke w/ Pt- informed of recommendations. He was recommended mag citrate by ortho and was finally able to go. He thanked me for call back.

## 2023-02-13 ENCOUNTER — Telehealth: Payer: Self-pay | Admitting: Orthopaedic Surgery

## 2023-02-13 ENCOUNTER — Other Ambulatory Visit: Payer: Self-pay | Admitting: Physician Assistant

## 2023-02-13 MED ORDER — OXYCODONE-ACETAMINOPHEN 5-325 MG PO TABS: 1.0000 | ORAL_TABLET | Freq: Three times a day (TID) | ORAL | 0 refills | Status: DC | PRN

## 2023-02-13 NOTE — Telephone Encounter (Signed)
Called and notified via voicemail

## 2023-02-13 NOTE — Telephone Encounter (Signed)
Patient called. He would like a refill on percocet. His call back number is [phone]

## 2023-02-13 NOTE — Telephone Encounter (Signed)
Sent.  Weaning dose

## 2023-02-17 ENCOUNTER — Ambulatory Visit (INDEPENDENT_AMBULATORY_CARE_PROVIDER_SITE_OTHER): Payer: BC Managed Care – PPO | Admitting: Physician Assistant

## 2023-02-17 ENCOUNTER — Encounter: Payer: Self-pay | Admitting: Physician Assistant

## 2023-02-17 DIAGNOSIS — Z96642 Presence of left artificial hip joint: Secondary | ICD-10-CM

## 2023-02-17 MED ORDER — OXYCODONE-ACETAMINOPHEN 5-325 MG PO TABS: 1.0000 | ORAL_TABLET | Freq: Three times a day (TID) | ORAL | 0 refills | Status: DC | PRN

## 2023-02-17 MED ORDER — CEFADROXIL 500 MG PO CAPS: 500.0000 mg | ORAL_CAPSULE | Freq: Two times a day (BID) | ORAL | 0 refills | Status: DC

## 2023-02-17 NOTE — Progress Notes (Signed)
Post-Op Visit Note   Patient: [name]           Date of Birth: [birth year]           MRN: [id number] Visit Date: 02/17/2023 PCP: Wanda Plump, MD   Assessment & Plan:  Chief Complaint:  Chief Complaint  Patient presents with   Left Hip - Follow-up    Left total hip arthroplasty 02/03/2023   Visit Diagnoses:  1. Status post total replacement of left hip     Plan: Patient is a pleasant 62 year old gentleman who comes in today 2 weeks status post left total hip replacement 02/03/2023.  He has been doing well.  He has been taking Percocet for pain and is making progress with home health physical therapy.  Currently ambulating with a walker.  He is on chronic Eliquis.  Examination of the left hip reveals a fully healed surgical incision with nylon sutures in place.  No evidence of infection or cellulitis.  He does have a moderate-sized seroma that is nontender and nonerythematous.  Calves are soft nontender.  He is neurovascular intact distally.  Today, I aspirated the seroma and obtained approximately 30 cc of blood.  I have started him on cefadroxil prophylactically for the next 2 weeks.  He will use ice and heat over the seroma.  He will follow-up with [place] in 4 weeks for recheck.  If his seroma becomes very large or painful he will let us know.  Call with concerns or questions in the meantime. Follow-Up Instructions: Return in about 4 weeks (around 03/17/2023).   Orders:  No orders of the defined types were placed in this encounter.  Meds ordered this encounter  Medications   cefadroxil (DURICEF) 500 MG capsule    Sig: Take 1 capsule (500 mg total) by mouth 2 (two) times daily.    Dispense:  28 capsule    Refill:  0   oxyCODONE-acetaminophen (PERCOCET) 5-325 MG tablet    Sig: Take 1 tablet by mouth every 8 (eight) hours as needed. TO BE TAKEN AFTER SURGERY.    Dispense:  40 tablet    Refill:  0    Imaging: No new imaging  PMFS History: Patient Active Problem List    Diagnosis Date Noted   Status post total replacement of left hip 02/03/2023   Primary osteoarthritis of left hip 02/02/2023   Avascular necrosis of bone of left hip (HCC) 12/23/2022   High risk heterosexual behavior 02/05/2021   History of colonic polyps 11/04/2019   PCP NOTES >>>>>>>>>>>>>> 07/26/2016   GERD (gastroesophageal reflux disease) 07/26/2016   Annual physical exam 07/25/2016   HTN (hypertension) 09/16/2014   Nausea without vomiting 11/29/2013   Deep vein thrombosis (HCC) 10/06/2010   Long term current use of anticoagulant 10/06/2010   Renal infarct (HCC) 10/06/2010   Dyslipidemia 09/03/2009   CHEST PAIN-UNSPECIFIED 09/03/2009   SHOULDER PAIN, RIGHT 03/02/2009   PERIPHERAL VASCULAR DISEASE 02/11/2009   SKIN LESION 02/04/2008   Polysubstance abuse (HCC) 05/28/2007   DISORDER, VASCULAR, KIDNEY 05/28/2007   DVT, HX OF 12/25/2006   Past Medical History:  Diagnosis Date   Chest pain    stres test neg x 2, cath 5/12; minimal LAD irregs; no obs CAD; normal LVF   Clotting disorder (HCC)    dvt   GERD (gastroesophageal reflux disease)    History of DVT of lower extremity    on chronic coumadin   Hypertension    Internal hemorrhoids    Cscope  2007   PAD (peripheral artery disease) (HCC)    a. left leg ischemia tx wtih embolectomy of left fem, pop, tib arteries with Dr. Edilia Bo - 08/2006;  b. known occlusion of right pop with collats - med Rx;  c.ABI's 8/09: R 1.0; L 0.91    PFO (patent foramen ovale)    per 08/2006 discharge summary, TEE showed EF 60%, small PFO with minimal right-to-left shunt with Valsalva; not felt to be the source of emboli, lifelong Coumadin recommended   Pneumonia    history of   Polysubstance abuse (HCC)    marijuana only   Renal infarct (HCC)    R    Family History  Problem Relation Age of Onset   Hypertension Mother    Breast cancer Mother    Hypertension Father    CAD Father    Diabetes Father    Lung cancer Maternal Uncle    Pancreatic  cancer Brother    Prostate cancer Maternal Uncle    Heart disease Paternal Grandmother    Colon cancer Neg Hx    Stomach cancer Neg Hx    Esophageal cancer Neg Hx    Rectal cancer Neg Hx     Past Surgical History:  Procedure Laterality Date   CHOLECYSTECTOMY     COLONOSCOPY     INGUINAL HERNIA REPAIR Left 07/16/2019   Procedure: LAPAROSCOPIC LEFT INGUINAL HERNIA REPAIR WITH MESH;  Surgeon: Axel Filler, MD;  Location[facility] OR;  Service: General;  Laterality: Left;   left leg blood clot removal 2009  2009   TONSILLECTOMY     TOTAL HIP ARTHROPLASTY Left 02/03/2023   Procedure: LEFT TOTAL HIP ARTHROPLASTY ANTERIOR APPROACH;  Surgeon: Tarry Kos, MD;  Location: [facility] OR;  Service: Orthopedics;  Laterality: Left;  3-C   WRIST SURGERY Left    Social History   Occupational History   Occupation: [employer], driver   Tobacco Use   Smoking status: Every Day    Current packs/day: 1.00    Average packs/day: 1 pack/day for 30.0 years (30.0 ttl pk-yrs)    Types: Cigarettes   Smokeless tobacco: Never   Tobacco comments:    1 to 1.5 ppd   Vaping Use   Vaping status: Never Used  Substance and Sexual Activity   Alcohol use: Yes    Alcohol/week: 21.0 standard drinks of alcohol    Types: 21 Standard drinks or equivalent per week    Comment: 3 drinks per day, every day   Drug use: Yes    Types: Marijuana    Comment: smokes every day   Sexual activity: Yes    Partners: Female

## 2023-02-21 ENCOUNTER — Other Ambulatory Visit: Payer: Self-pay | Admitting: Physician Assistant

## 2023-02-21 ENCOUNTER — Telehealth: Payer: Self-pay | Admitting: Physician Assistant

## 2023-02-21 MED ORDER — OXYCODONE-ACETAMINOPHEN 5-325 MG PO TABS: 1.0000 | ORAL_TABLET | Freq: Three times a day (TID) | ORAL | 0 refills | Status: DC | PRN

## 2023-02-21 NOTE — Telephone Encounter (Signed)
 sent 

## 2023-02-21 NOTE — Telephone Encounter (Signed)
Patient called needing Rx refilled Oxycodone. Patient said he has 5 tabs left.  The number to contact patient is [phone]

## 2023-02-21 NOTE — Telephone Encounter (Signed)
Called and notified via voicemail

## 2023-02-22 ENCOUNTER — Other Ambulatory Visit: Payer: Self-pay | Admitting: Physician Assistant

## 2023-02-22 ENCOUNTER — Telehealth: Payer: Self-pay | Admitting: Orthopaedic Surgery

## 2023-02-22 NOTE — Telephone Encounter (Signed)
[facility] will not allow him to pick up Rx of O[facility]codone and he needs it sent to due to discrepancies with the order and the frequency please advise

## 2023-02-22 NOTE — Telephone Encounter (Signed)
Way too early.  Was sent in yesterday.

## 2023-02-27 ENCOUNTER — Ambulatory Visit: Payer: BC Managed Care – PPO | Admitting: Physician Assistant

## 2023-02-27 ENCOUNTER — Encounter: Payer: Self-pay | Admitting: Physician Assistant

## 2023-02-27 DIAGNOSIS — Z96642 Presence of left artificial hip joint: Secondary | ICD-10-CM

## 2023-02-27 MED ORDER — CEFADROXIL 500 MG PO CAPS: 500.0000 mg | ORAL_CAPSULE | Freq: Two times a day (BID) | ORAL | 0 refills | Status: DC

## 2023-02-27 MED ORDER — OXYCODONE-ACETAMINOPHEN 5-325 MG PO TABS: 1.0000 | ORAL_TABLET | Freq: Three times a day (TID) | ORAL | 0 refills | Status: DC | PRN

## 2023-02-27 NOTE — Progress Notes (Signed)
Post-Op Visit Note   Patient: [name]           Date of Birth: [birth year]           MRN: [id number] Visit Date: 02/27/2023 PCP: Wanda Plump, MD   Assessment & Plan:  Chief Complaint:  Chief Complaint  Patient presents with   Left Hip - Follow-up    Left total hip arthroplasty 02/03/2023   Visit Diagnoses:  1. Status post total replacement of left hip     Plan: Patient is a pleasant 62 year old gentleman who comes in today little over 3 weeks status post left total hip replacement 02/03/2023.  At his 2-week postop visit he had a moderate-sized seroma that was aspirated.  He was started on cefadroxil.  He is still taking cefadroxil.  He tells me that the seroma has reaccumulated.  Slightly increased pain to the lateral hip.  In regards to the hip joint itself, he feels that this has improved.  He is able to move this better.  He denies any fevers or chills or any other constitutional symptoms.  Examination of his left hip reveals a large seroma.  This is nontender.  No erythema no warmth.  Fully healed surgical scar without complication.  He is neurovascularly intact distally.  Today, approximately 250 cc of serosanguineous fluid was aspirated from the seroma.  I recommended ice and heat for this.  I have refilled his cefadroxil.  He will back off activity.  He is on Eliquis which I think may be contributing to increased swelling.  Currently no signs of infection.  He will let us know if this reaccumulates.  Otherwise, follow-up at his regularly scheduled appointment in the next 2 to 3 weeks.  Call with concerns or questions in the meantime.  Follow-Up Instructions: Return for f/u at regularly scheduled appointment.   Orders:  No orders of the defined types were placed in this encounter.  Meds ordered this encounter  Medications   cefadroxil (DURICEF) 500 MG capsule    Sig: Take 1 capsule (500 mg total) by mouth 2 (two) times daily.    Dispense:  28 capsule    Refill:  0    oxyCODONE-acetaminophen (PERCOCET) 5-325 MG tablet    Sig: Take 1 tablet by mouth every 8 (eight) hours as needed. TO BE TAKEN AFTER SURGERY.    Dispense:  30 tablet    Refill:  0    Imaging: No new imaging  PMFS History: Patient Active Problem List   Diagnosis Date Noted   Status post total replacement of left hip 02/03/2023   Primary osteoarthritis of left hip 02/02/2023   Avascular necrosis of bone of left hip (HCC) 12/23/2022   High risk heterosexual behavior 02/05/2021   History of colonic polyps 11/04/2019   PCP NOTES >>>>>>>>>>>>>> 07/26/2016   GERD (gastroesophageal reflux disease) 07/26/2016   Annual physical exam 07/25/2016   HTN (hypertension) 09/16/2014   Nausea without vomiting 11/29/2013   Deep vein thrombosis (HCC) 10/06/2010   Long term current use of anticoagulant 10/06/2010   Renal infarct (HCC) 10/06/2010   Dyslipidemia 09/03/2009   CHEST PAIN-UNSPECIFIED 09/03/2009   SHOULDER PAIN, RIGHT 03/02/2009   PERIPHERAL VASCULAR DISEASE 02/11/2009   SKIN LESION 02/04/2008   Polysubstance abuse (HCC) 05/28/2007   DISORDER, VASCULAR, KIDNEY 05/28/2007   DVT, HX OF 12/25/2006   Past Medical History:  Diagnosis Date   Chest pain    stres test neg x 2, cath 5/12; minimal LAD irregs; no  obs CAD; normal LVF   Clotting disorder (HCC)    dvt   GERD (gastroesophageal reflux disease)    History of DVT of lower extremity    on chronic coumadin   Hypertension    Internal hemorrhoids    Cscope 2007   PAD (peripheral artery disease) (HCC)    a. left leg ischemia tx wtih embolectomy of left fem, pop, tib arteries with Dr. Edilia Bo - 08/2006;  b. known occlusion of right pop with collats - med Rx;  c.ABI's 8/09: R 1.0; L 0.91    PFO (patent foramen ovale)    per 08/2006 discharge summary, TEE showed EF 60%, small PFO with minimal right-to-left shunt with Valsalva; not felt to be the source of emboli, lifelong Coumadin recommended   Pneumonia    history of   Polysubstance  abuse (HCC)    marijuana only   Renal infarct (HCC)    R    Family History  Problem Relation Age of Onset   Hypertension Mother    Breast cancer Mother    Hypertension Father    CAD Father    Diabetes Father    Lung cancer Maternal Uncle    Pancreatic cancer Brother    Prostate cancer Maternal Uncle    Heart disease Paternal Grandmother    Colon cancer Neg Hx    Stomach cancer Neg Hx    Esophageal cancer Neg Hx    Rectal cancer Neg Hx     Past Surgical History:  Procedure Laterality Date   CHOLECYSTECTOMY     COLONOSCOPY     INGUINAL HERNIA REPAIR Left 07/16/2019   Procedure: LAPAROSCOPIC LEFT INGUINAL HERNIA REPAIR WITH MESH;  Surgeon: Axel Filler, MD;  Location: [facility] OR;  Service: General;  Laterality: Left;   left leg blood clot removal 2009  2009   TONSILLECTOMY     TOTAL HIP ARTHROPLASTY Left 02/03/2023   Procedure: LEFT TOTAL HIP ARTHROPLASTY ANTERIOR APPROACH;  Surgeon: Tarry Kos, MD;  Location: [facility] OR;  Service: Orthopedics;  Laterality: Left;  3-C   WRIST SURGERY Left    Social History   Occupational History   Occupation: [employer], driver   Tobacco Use   Smoking status: Every Day    Current packs/day: 1.00    Average packs/day: 1 pack/day for 30.0 years (30.0 ttl pk-yrs)    Types: Cigarettes   Smokeless tobacco: Never   Tobacco comments:    1 to 1.5 ppd   Vaping Use   Vaping status: Never Used  Substance and Sexual Activity   Alcohol use: Yes    Alcohol/week: 21.0 standard drinks of alcohol    Types: 21 Standard drinks or equivalent per week    Comment: 3 drinks per day, every day   Drug use: Yes    Types: Marijuana    Comment: smokes every day   Sexual activity: Yes    Partners: Female

## 2023-03-01 ENCOUNTER — Encounter: Payer: BC Managed Care – PPO | Admitting: Physician Assistant

## 2023-03-02 ENCOUNTER — Telehealth: Payer: Self-pay

## 2023-03-02 NOTE — Telephone Encounter (Signed)
Called [facility]. Dr.Xu agrees with plan of care. Continue HHPT for 5 weeks with coming 1 time per week.  [phone]

## 2023-03-03 ENCOUNTER — Telehealth: Payer: Self-pay | Admitting: Orthopaedic Surgery

## 2023-03-03 ENCOUNTER — Telehealth: Payer: Self-pay

## 2023-03-03 NOTE — Telephone Encounter (Signed)
[facility] calling stating that patients seroma has come completely back his cb# is [phone] or call patient to schedule an appt for another aspiration next week?

## 2023-03-03 NOTE — Telephone Encounter (Signed)
Patient called. His hip is swollen again. Needs to be drained. Next available is 8/1. Patient says he needs to be seen before Thursday. Would like a call. [phone]

## 2023-03-06 ENCOUNTER — Telehealth: Payer: Self-pay | Admitting: Orthopaedic Surgery

## 2023-03-06 NOTE — Telephone Encounter (Signed)
Sure he can be worked in to see [name] to have it aspirated

## 2023-03-06 NOTE — Telephone Encounter (Signed)
Ok to work in.

## 2023-03-07 ENCOUNTER — Encounter: Payer: Self-pay | Admitting: Physician Assistant

## 2023-03-07 ENCOUNTER — Ambulatory Visit (INDEPENDENT_AMBULATORY_CARE_PROVIDER_SITE_OTHER): Payer: BC Managed Care – PPO | Admitting: Physician Assistant

## 2023-03-07 DIAGNOSIS — Z96642 Presence of left artificial hip joint: Secondary | ICD-10-CM | POA: Diagnosis not present

## 2023-03-07 MED ORDER — LIDOCAINE HCL 1 % IJ SOLN
5.0000 mL | INTRAMUSCULAR | Status: AC | PRN
Start: 2023-03-07 — End: 2023-03-07
  Administered 2023-03-07: 5 mL

## 2023-03-07 MED ORDER — BUPIVACAINE HCL 0.25 % IJ SOLN
5.0000 mL | INTRAMUSCULAR | Status: AC | PRN
Start: 2023-03-07 — End: 2023-03-07
  Administered 2023-03-07: 5 mL via INTRA_ARTICULAR

## 2023-03-07 NOTE — Progress Notes (Signed)
Procedure Note  Patient: [name]             Date of Birth: [birth year]           MRN: [id number]             Visit Date: 03/07/2023  Procedures: Visit Diagnoses:  1. Status post total replacement of left hip     Large Joint Inj on 03/07/2023 10:39 AM Medications: 5 mL lidocaine 1 %; 5 mL bupivacaine 0.25 %        Post-Op Visit Note   Patient: [name]           Date of Birth: [birth year]           MRN: [id number] Visit Date: 03/07/2023 PCP: Wanda Plump, MD   Assessment & Plan:  Chief Complaint:  Chief Complaint  Patient presents with   Left Hip - Follow-up    Left total hip arthroplasty 02/03/2023   Visit Diagnoses:  1. Status post total replacement of left hip     Plan: Patient is a pleasant 62 year old gentleman who comes in approximately 4-1/2 weeks status post left total hip replacement.  He is here for recurrent left hip seroma.  He is on Eliquis for history of DVTs.  He has been taking cefadroxil prophylactically.  He was seen in our office last week where the left hip seroma was aspirated.  He notes that this has recurred.  No fevers or chills or any other systemic symptoms.  He notes mild discomfort to the thigh.  He is currently ambulating with a walker.  Examination of his left hip reveals a fully healed surgical scar without complication.  He does have recurrent swelling to the seroma.  This is nontender.  No skin changes.  Today, we had a long discussion on continuing to aspirate the seroma as it will likely recur as he is on Eliquis.  We have also discussed that the seroma will likely take several months to subside.  He would like to have this drained once more.  We will apply compression wrap for which she will continue at home.  He will continue with ice and heat.  He will finish out his remaining cefadroxil.  Follow-up at his regularly scheduled 6-week postop appointment for repeat evaluation and AP pelvis x-rays.  Call with concerns or  questions.  Follow-Up Instructions: Return in about 2 weeks (around 03/21/2023).   Orders:  Orders Placed This Encounter  Procedures   Large Joint Inj   No orders of the defined types were placed in this encounter.   Imaging: No new imaging  PMFS History: Patient Active Problem List   Diagnosis Date Noted   Status post total replacement of left hip 02/03/2023   Primary osteoarthritis of left hip 02/02/2023   Avascular necrosis of bone of left hip (HCC) 12/23/2022   High risk heterosexual behavior 02/05/2021   History of colonic polyps 11/04/2019   PCP NOTES >>>>>>>>>>>>>> 07/26/2016   GERD (gastroesophageal reflux disease) 07/26/2016   Annual physical exam 07/25/2016   HTN (hypertension) 09/16/2014   Nausea without vomiting 11/29/2013   Deep vein thrombosis (HCC) 10/06/2010   Long term current use of anticoagulant 10/06/2010   Renal infarct (HCC) 10/06/2010   Dyslipidemia 09/03/2009   CHEST PAIN-UNSPECIFIED 09/03/2009   SHOULDER PAIN, RIGHT 03/02/2009   PERIPHERAL VASCULAR DISEASE 02/11/2009   SKIN LESION 02/04/2008   Polysubstance abuse (HCC) 05/28/2007   DISORDER, VASCULAR, KIDNEY 05/28/2007   DVT, HX OF  12/25/2006   Past Medical History:  Diagnosis Date   Chest pain    stres test neg x 2, cath 5/12; minimal LAD irregs; no obs CAD; normal LVF   Clotting disorder (HCC)    dvt   GERD (gastroesophageal reflux disease)    History of DVT of lower extremity    on chronic coumadin   Hypertension    Internal hemorrhoids    Cscope 2007   PAD (peripheral artery disease) (HCC)    a. left leg ischemia tx wtih embolectomy of left fem, pop, tib arteries with Dr. Edilia Bo - 08/2006;  b. known occlusion of right pop with collats - med Rx;  c.ABI's 8/09: R 1.0; L 0.91    PFO (patent foramen ovale)    per 08/2006 discharge summary, TEE showed EF 60%, small PFO with minimal right-to-left shunt with Valsalva; not felt to be the source of emboli, lifelong Coumadin recommended    Pneumonia    history of   Polysubstance abuse (HCC)    marijuana only   Renal infarct (HCC)    R    Family History  Problem Relation Age of Onset   Hypertension Mother    Breast cancer Mother    Hypertension Father    CAD Father    Diabetes Father    Lung cancer Maternal Uncle    Pancreatic cancer Brother    Prostate cancer Maternal Uncle    Heart disease Paternal Grandmother    Colon cancer Neg Hx    Stomach cancer Neg Hx    Esophageal cancer Neg Hx    Rectal cancer Neg Hx     Past Surgical History:  Procedure Laterality Date   CHOLECYSTECTOMY     COLONOSCOPY     INGUINAL HERNIA REPAIR Left 07/16/2019   Procedure: LAPAROSCOPIC LEFT INGUINAL HERNIA REPAIR WITH MESH;  Surgeon: Axel Filler, MD;  Location: [facility] OR;  Service: General;  Laterality: Left;   left leg blood clot removal 2009  2009   TONSILLECTOMY     TOTAL HIP ARTHROPLASTY Left 02/03/2023   Procedure: LEFT TOTAL HIP ARTHROPLASTY ANTERIOR APPROACH;  Surgeon: Tarry Kos, MD;  Location: [facility] OR;  Service: Orthopedics;  Laterality: Left;  3-C   WRIST SURGERY Left    Social History   Occupational History   Occupation: [employer], driver   Tobacco Use   Smoking status: Every Day    Current packs/day: 1.00    Average packs/day: 1 pack/day for 30.0 years (30.0 ttl pk-yrs)    Types: Cigarettes   Smokeless tobacco: Never   Tobacco comments:    1 to 1.5 ppd   Vaping Use   Vaping status: Never Used  Substance and Sexual Activity   Alcohol use: Yes    Alcohol/week: 21.0 standard drinks of alcohol    Types: 21 Standard drinks or equivalent per week    Comment: 3 drinks per day, every day   Drug use: Yes    Types: Marijuana    Comment: smokes every day   Sexual activity: Yes    Partners: Female

## 2023-03-08 ENCOUNTER — Telehealth: Payer: Self-pay | Admitting: Orthopaedic Surgery

## 2023-03-08 NOTE — Telephone Encounter (Signed)
Pt disability company called him stating they do not have his information about when he came out of work and the follow up info from the [employer] form and it has to be in by August 9th or they will cut him off.

## 2023-03-08 NOTE — Telephone Encounter (Signed)
IC, spoke with patient. Advised him we received a fax from [employer] [place] and faxed notes on 7/29 but, a form was not received. That was noted on the fax back to [facility] [place] that form was not received to please fax the form. I refaxed this and advised pt. I also encouraged pt to reach out to them as well to have them fax [place] the form.

## 2023-03-09 ENCOUNTER — Telehealth: Payer: Self-pay | Admitting: Orthopedic Surgery

## 2023-03-09 ENCOUNTER — Other Ambulatory Visit: Payer: Self-pay | Admitting: Physician Assistant

## 2023-03-09 MED ORDER — HYDROCODONE-ACETAMINOPHEN 5-325 MG PO TABS: 1.0000 | ORAL_TABLET | Freq: Two times a day (BID) | ORAL | 0 refills | Status: DC | PRN

## 2023-03-09 NOTE — Telephone Encounter (Signed)
Mr. Sartorius called in requesting a refill of oxycodone 5-325 mg be called into the pharmacy.  He uses [facility] [address].  Call back # is [phone].

## 2023-03-09 NOTE — Telephone Encounter (Signed)
Weaning to norco.  Also has diff instructions

## 2023-03-09 NOTE — Telephone Encounter (Signed)
Notified patient.

## 2023-03-13 ENCOUNTER — Telehealth: Payer: Self-pay | Admitting: Orthopaedic Surgery

## 2023-03-13 NOTE — Telephone Encounter (Signed)
[employer] forms received. To [employer].

## 2023-03-14 ENCOUNTER — Ambulatory Visit: Payer: BC Managed Care – PPO | Admitting: Orthopaedic Surgery

## 2023-03-20 ENCOUNTER — Telehealth: Payer: Self-pay | Admitting: Orthopaedic Surgery

## 2023-03-20 ENCOUNTER — Other Ambulatory Visit: Payer: Self-pay | Admitting: Physician Assistant

## 2023-03-20 MED ORDER — HYDROCODONE-ACETAMINOPHEN 5-325 MG PO TABS: 1.0000 | ORAL_TABLET | Freq: Two times a day (BID) | ORAL | 0 refills | Status: DC | PRN

## 2023-03-20 NOTE — Telephone Encounter (Signed)
Patient called needing Rx refilled Hydrocodone. The number to contact patient is [phone]

## 2023-03-20 NOTE — Telephone Encounter (Signed)
sent 

## 2023-03-23 ENCOUNTER — Telehealth: Payer: Self-pay | Admitting: Orthopaedic Surgery

## 2023-03-23 NOTE — Telephone Encounter (Signed)
No need to refill at this point.

## 2023-03-23 NOTE — Telephone Encounter (Signed)
Patient called. Needs a refill on cefadroxil. His cb# [phone]

## 2023-03-23 NOTE — Telephone Encounter (Signed)
Called and notified patient.

## 2023-03-27 ENCOUNTER — Telehealth: Payer: Self-pay | Admitting: Orthopaedic Surgery

## 2023-03-27 NOTE — Telephone Encounter (Signed)
Pt called requesting a refill of hydrocodone. Please send to pharmacy on file. Pt phone number is [phone]

## 2023-03-28 MED ORDER — HYDROCODONE-ACETAMINOPHEN 5-325 MG PO TABS: 1.0000 | ORAL_TABLET | Freq: Every day | ORAL | 0 refills | Status: DC | PRN

## 2023-03-28 NOTE — Telephone Encounter (Signed)
Done

## 2023-04-03 ENCOUNTER — Ambulatory Visit (INDEPENDENT_AMBULATORY_CARE_PROVIDER_SITE_OTHER): Payer: Medicaid Other | Admitting: Family Medicine

## 2023-04-03 ENCOUNTER — Encounter: Payer: Self-pay | Admitting: Internal Medicine

## 2023-04-03 ENCOUNTER — Encounter: Payer: Self-pay | Admitting: Family Medicine

## 2023-04-03 VITALS — BP 110/80 | HR 99 | Temp 98.6°F | Ht 73.0 in | Wt 175.5 lb

## 2023-04-03 DIAGNOSIS — N492 Inflammatory disorders of scrotum: Secondary | ICD-10-CM

## 2023-04-03 MED ORDER — HYDROCODONE-ACETAMINOPHEN 5-325 MG PO TABS
1.0000 | ORAL_TABLET | Freq: Four times a day (QID) | ORAL | 0 refills | Status: DC | PRN
Start: 2023-04-03 — End: 2023-05-18

## 2023-04-03 MED ORDER — DOXYCYCLINE HYCLATE 100 MG PO TABS
100.0000 mg | ORAL_TABLET | Freq: Two times a day (BID) | ORAL | 0 refills | Status: AC
Start: 2023-04-03 — End: 2023-04-10

## 2023-04-03 NOTE — Patient Instructions (Addendum)
Do not shower for the rest of the day. When you do wash it, use only soap and water. Do not vigorously scrub. Apply triple antibiotic ointment (like Neosporin) twice daily. Keep the area clean and dry.   Things to look out for: increasing pain not relieved by ice/acetaminophen, fevers, spreading redness, drainage of pus, or foul odor.  Do not drink alcohol, do any illicit/street drugs, drive or do anything that requires alertness while on this medicine.   Let us know if you need anything.

## 2023-04-03 NOTE — Progress Notes (Signed)
Chief Complaint  Patient presents with   Cyst    In crease of leg    [name] is a 62 y.o. male here for a skin complaint.  Duration: 2 days Location: R inguinal region Pruritic? No Painful? Yes Drainage? No New soaps/lotions/topicals/detergents? No Trauma? No Other associated symptoms: pain w ejaculation; no fevers or redness Therapies tried thus far: Tylenol, Norco (for hip surgery); he is on blood thinners  Past Medical History:  Diagnosis Date   Chest pain    stres test neg x 2, cath 5/12; minimal LAD irregs; no obs CAD; normal LVF   Clotting disorder (HCC)    dvt   GERD (gastroesophageal reflux disease)    History of DVT of lower extremity    on chronic coumadin   Hypertension    Internal hemorrhoids    Cscope 2007   PAD (peripheral artery disease) (HCC)    a. left leg ischemia tx wtih embolectomy of left fem, pop, tib arteries with Dr. Edilia Bo - 08/2006;  b. known occlusion of right pop with collats - med Rx;  c.ABI's 8/09: R 1.0; L 0.91    PFO (patent foramen ovale)    per 08/2006 discharge summary, TEE showed EF 60%, small PFO with minimal right-to-left shunt with Valsalva; not felt to be the source of emboli, lifelong Coumadin recommended   Pneumonia    history of   Polysubstance abuse (HCC)    marijuana only   Renal infarct (HCC)    R    BP 110/80 (BP Location: Left Arm, Patient Position: Sitting, Cuff Size: Normal)   Pulse 99   Temp 98.6 F (37 C) (Oral)   Ht 6\' 1"  (1.854 m)   Wt 175 lb 8 oz (79.6 kg)   SpO2 97%   BMI 23.15 kg/m  Gen: awake, alert, appearing stated age Lungs: No accessory muscle use Skin: Of the right portion of the scrotum, there is a larger area of induration measuring approximately 5 cm x 3 cm with an overlying area of fluctuance measuring approximately 2.5 x 1.5 cm.  There is erythema and warmth.  There is fluctuance without drainage.  No excoriation or crepitus.  No evidence of hernia.  There is no extension further behind  this focal lesion. Psych: Age appropriate judgment and insight   Procedure note; incision and drainage Informed consent obtained. The area was cleaned with alcohol. The area was anesthetized with 3 mL of 1% lidocaine with epinephrine. Once adequate anesthesia was obtained, a cruciate incision was made with 11 blade scalpel. Approximately 12 mL of purulent material with blood was expressed. Cultures were taken. Loculations were interrupted with a curved hemostat. The area was packed with approximately 6 cm of 0.25 in iodoform gauze. The area was then dressed with gauze. There were no complications noted. The patient tolerated the procedure well.  Furuncle of scrotum - Plan: doxycycline (VIBRA-TABS) 100 MG tablet, HYDROcodone-acetaminophen (NORCO) 5-325 MG tablet, PR INCISION & DRAINAGE ABSCESS SIMPLE/SINGLE  Short course of Norco provided for pain relief.  Verbalized and warning symptoms for this.  I&D today successful.  7 days of doxycycline giving surrounding cellulitis apparent on exam. Warning signs and symptoms verbalized and written down in AVS. F/u in 1 week for wound check. The patient voiced understanding and agreement to the plan.  Jilda Roche [place], DO 04/03/23 12:08 PM

## 2023-04-04 ENCOUNTER — Other Ambulatory Visit: Payer: Self-pay

## 2023-04-04 ENCOUNTER — Telehealth: Payer: Self-pay | Admitting: Orthopaedic Surgery

## 2023-04-04 DIAGNOSIS — Z96642 Presence of left artificial hip joint: Secondary | ICD-10-CM

## 2023-04-04 NOTE — Telephone Encounter (Signed)
Pt inpatient PT called to inform that pt is being discharged today from [facility] and is requesting pt to do outpatient PT. Call back number if needed [phone].

## 2023-04-04 NOTE — Telephone Encounter (Signed)
Yes that's fine 

## 2023-04-04 NOTE — Telephone Encounter (Signed)
Order for outpatient PT has been placed

## 2023-04-12 ENCOUNTER — Encounter: Payer: Self-pay | Admitting: Orthopaedic Surgery

## 2023-04-12 ENCOUNTER — Other Ambulatory Visit (INDEPENDENT_AMBULATORY_CARE_PROVIDER_SITE_OTHER): Payer: Medicaid Other

## 2023-04-12 ENCOUNTER — Ambulatory Visit (INDEPENDENT_AMBULATORY_CARE_PROVIDER_SITE_OTHER): Payer: Medicaid Other | Admitting: Orthopaedic Surgery

## 2023-04-12 DIAGNOSIS — Z96642 Presence of left artificial hip joint: Secondary | ICD-10-CM | POA: Diagnosis not present

## 2023-04-12 MED ORDER — HYDROCODONE-ACETAMINOPHEN 5-325 MG PO TABS: 1.0000 | ORAL_TABLET | Freq: Every day | ORAL | 0 refills | Status: DC | PRN

## 2023-04-12 NOTE — Progress Notes (Signed)
Post-Op Visit Note   Patient: [name]           Date of Birth: [birth year]           MRN: [id number] Visit Date: 04/12/2023 PCP: Wanda Plump, MD   Assessment & Plan:  Chief Complaint:  Chief Complaint  Patient presents with   Left Hip - Follow-up    Left total hip arthroplasty 02/03/2023   Visit Diagnoses:  1. Status post total replacement of left hip     Plan: Patient is a pleasant 62 year old gentleman who comes in today approximately 10 weeks status post left total hip replacement 02/03/2023.  He has been ambulating with a cane and getting home health physical therapy.  He is scheduled to start outpatient physical therapy tomorrow.  He still notes occasional twinges of pain when moving his left leg certain directions.  He also tells me he had a boil come up in his right groin last Monday.  This was lanced by his primary care provider and he was started on doxycycline.  He notes that this started draining again last night.  He is scheduled to follow-up with his primary care provider.  Examination of the left hip reveals slight pain and weakness with hip flexion.  Slight discomfort with logroll.  He is neurovascularly intact distally.  At this point, he will continue to work on exercises for the left hip.  I have refilled his Norco when the last time.  Dental prophylaxis reinforced.  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  Call with concerns or questions.  Follow-Up Instructions: Return in about 6 weeks (around 05/24/2023).   Orders:  Orders Placed This Encounter  Procedures   XR Pelvis 1-2 Views   Meds ordered this encounter  Medications   HYDROcodone-acetaminophen (NORCO) 5-325 MG tablet    Sig: Take 1 tablet by mouth daily as needed.    Dispense:  10 tablet    Refill:  0    Imaging: XR Pelvis 1-2 Views  Result Date: 04/12/2023 Well-seated prosthesis  without complication   PMFS History: Patient Active Problem List   Diagnosis Date Noted   Status post total replacement of left hip 02/03/2023   Primary osteoarthritis of left hip 02/02/2023   Avascular necrosis of bone of left hip (HCC) 12/23/2022   High risk heterosexual behavior 02/05/2021   History of colonic polyps 11/04/2019   PCP NOTES >>>>>>>>>>>>>> 07/26/2016   GERD (gastroesophageal reflux disease) 07/26/2016   Annual physical exam 07/25/2016   HTN (hypertension) 09/16/2014   Nausea without vomiting 11/29/2013   Deep vein thrombosis (HCC) 10/06/2010   Long term current use of anticoagulant 10/06/2010   Renal infarct (HCC) 10/06/2010   Dyslipidemia 09/03/2009   CHEST PAIN-UNSPECIFIED 09/03/2009   SHOULDER PAIN, RIGHT 03/02/2009   PERIPHERAL VASCULAR DISEASE 02/11/2009   SKIN LESION 02/04/2008   Polysubstance abuse (HCC) 05/28/2007   DISORDER, VASCULAR, KIDNEY 05/28/2007   DVT, HX OF 12/25/2006   Past Medical History:  Diagnosis Date   Chest pain    stres test neg x 2, cath 5/12; minimal LAD irregs; no obs CAD; normal LVF   Clotting disorder (HCC)    dvt   GERD (gastroesophageal reflux disease)    History of DVT of lower extremity    on chronic coumadin   Hypertension    Internal hemorrhoids  Cscope 2007   PAD (peripheral artery disease) (HCC)    a. left leg ischemia tx wtih embolectomy of left fem, pop, tib arteries with Dr. Edilia Bo - 08/2006;  b. known occlusion of right pop with collats - med Rx;  c.ABI's 8/09: R 1.0; L 0.91    PFO (patent foramen ovale)    per 08/2006 discharge summary, TEE showed EF 60%, small PFO with minimal right-to-left shunt with Valsalva; not felt to be the source of emboli, lifelong Coumadin recommended   Pneumonia    history of   Polysubstance abuse (HCC)    marijuana only   Renal infarct (HCC)    R    Family History  Problem Relation Age of Onset   Hypertension Mother    Breast cancer Mother    Hypertension Father    CAD  Father    Diabetes Father    Lung cancer Maternal Uncle    Pancreatic cancer Brother    Prostate cancer Maternal Uncle    Heart disease Paternal Grandmother    Colon cancer Neg Hx    Stomach cancer Neg Hx    Esophageal cancer Neg Hx    Rectal cancer Neg Hx     Past Surgical History:  Procedure Laterality Date   CHOLECYSTECTOMY     COLONOSCOPY     INGUINAL HERNIA REPAIR Left 07/16/2019   Procedure: LAPAROSCOPIC LEFT INGUINAL HERNIA REPAIR WITH MESH;  Surgeon: Axel Filler, MD;  Location: [facility] OR;  Service: General;  Laterality: Left;   left leg blood clot removal 2009  2009   TONSILLECTOMY     TOTAL HIP ARTHROPLASTY Left 02/03/2023   Procedure: LEFT TOTAL HIP ARTHROPLASTY ANTERIOR APPROACH;  Surgeon: Tarry Kos, MD;  Location: [facility] OR;  Service: Orthopedics;  Laterality: Left;  3-C   WRIST SURGERY Left    Social History   Occupational History   Occupation: [employer], driver   Tobacco Use   Smoking status: Every Day    Current packs/day: 1.00    Average packs/day: 1 pack/day for 30.0 years (30.0 ttl pk-yrs)    Types: Cigarettes   Smokeless tobacco: Never   Tobacco comments:    1 to 1.5 ppd   Vaping Use   Vaping status: Never Used  Substance and Sexual Activity   Alcohol use: Yes    Alcohol/week: 21.0 standard drinks of alcohol    Types: 21 Standard drinks or equivalent per week    Comment: 3 drinks per day, every day   Drug use: Yes    Types: Marijuana    Comment: smokes every day   Sexual activity: Yes    Partners: Female

## 2023-04-13 ENCOUNTER — Ambulatory Visit: Payer: Medicaid Other | Attending: Orthopaedic Surgery | Admitting: Physical Therapy

## 2023-04-13 ENCOUNTER — Encounter: Payer: Self-pay | Admitting: Physical Therapy

## 2023-04-13 ENCOUNTER — Other Ambulatory Visit: Payer: Self-pay

## 2023-04-13 DIAGNOSIS — M25652 Stiffness of left hip, not elsewhere classified: Secondary | ICD-10-CM | POA: Diagnosis present

## 2023-04-13 DIAGNOSIS — R262 Difficulty in walking, not elsewhere classified: Secondary | ICD-10-CM | POA: Insufficient documentation

## 2023-04-13 DIAGNOSIS — Z96642 Presence of left artificial hip joint: Secondary | ICD-10-CM | POA: Insufficient documentation

## 2023-04-13 DIAGNOSIS — M25552 Pain in left hip: Secondary | ICD-10-CM | POA: Insufficient documentation

## 2023-04-13 NOTE — Therapy (Signed)
OUTPATIENT PHYSICAL THERAPY LOWER EXTREMITY EVALUATION   Patient Name: [name] MRN: [id number] DOB:31-Jan-1961, 62 y.o., male Today's Date: 04/13/2023  END OF SESSION:  PT End of Session - 04/13/23 1022     Visit Number 1    Number of Visits 16    Date for PT Re-Evaluation 05/25/23    Authorization Type [place] [facility] [employer]    PT Start Time 1018    PT Stop Time 1100    PT Time Calculation (min) 42 min    Activity Tolerance Patient tolerated treatment well    Behavior During Therapy WFL for tasks assessed/performed             Past Medical History:  Diagnosis Date   Chest pain    stres test neg x 2, cath 5/12; minimal LAD irregs; no obs CAD; normal LVF   Clotting disorder (HCC)    dvt   GERD (gastroesophageal reflux disease)    History of DVT of lower extremity    on chronic coumadin   Hypertension    Internal hemorrhoids    Cscope 2007   PAD (peripheral artery disease) (HCC)    a. left leg ischemia tx wtih embolectomy of left fem, pop, tib arteries with Dr. Edilia Bo - 08/2006;  b. known occlusion of right pop with collats - med Rx;  c.ABI's 8/09: R 1.0; L 0.91    PFO (patent foramen ovale)    per 08/2006 discharge summary, TEE showed EF 60%, small PFO with minimal right-to-left shunt with Valsalva; not felt to be the source of emboli, lifelong Coumadin recommended   Pneumonia    history of   Polysubstance abuse (HCC)    marijuana only   Renal infarct [facility])    R   Past Surgical History:  Procedure Laterality Date   CHOLECYSTECTOMY     COLONOSCOPY     INGUINAL HERNIA REPAIR Left 07/16/2019   Procedure: LAPAROSCOPIC LEFT INGUINAL HERNIA REPAIR WITH MESH;  Surgeon: Axel Filler, MD;  Location: [facility] OR;  Service: General;  Laterality: Left;   left leg blood clot removal 2009  2009   TONSILLECTOMY     TOTAL HIP ARTHROPLASTY Left 02/03/2023   Procedure: LEFT TOTAL HIP ARTHROPLASTY ANTERIOR APPROACH;  Surgeon: Tarry Kos, MD;  Location: [facility] OR;  Service: Orthopedics;   Laterality: Left;  3-C   WRIST SURGERY Left    Patient Active Problem List   Diagnosis Date Noted   Status post total replacement of left hip 02/03/2023   Primary osteoarthritis of left hip 02/02/2023   Avascular necrosis of bone of left hip (HCC) 12/23/2022   High risk heterosexual behavior 02/05/2021   History of colonic polyps 11/04/2019   PCP NOTES >>>>>>>>>>>>>> 07/26/2016   GERD (gastroesophageal reflux disease) 07/26/2016   Annual physical exam 07/25/2016   HTN (hypertension) 09/16/2014   Nausea without vomiting 11/29/2013   Deep vein thrombosis (HCC) 10/06/2010   Long term current use of anticoagulant 10/06/2010   Renal infarct (HCC) 10/06/2010   Dyslipidemia 09/03/2009   CHEST PAIN-UNSPECIFIED 09/03/2009   SHOULDER PAIN, RIGHT 03/02/2009   PERIPHERAL VASCULAR DISEASE 02/11/2009   SKIN LESION 02/04/2008   Polysubstance abuse (HCC) 05/28/2007   DISORDER, VASCULAR, KIDNEY 05/28/2007   DVT, HX OF 12/25/2006    PCP: Wanda Plump MD   REFERRING PROVIDER: Gershon Mussel  REFERRING DIAG: [phone] (ICD-10-CM) - Status post total replacement of left hip  THERAPY DIAG:  Pain in left hip  S/P total left hip arthroplasty  Stiffness  of left hip, not elsewhere classified  Difficulty in walking, not elsewhere classified  Rationale for Evaluation and Treatment: Rehabilitation  ONSET DATE: 02/03/23  SUBJECTIVE:   SUBJECTIVE STATEMENT:  Patient is a 62 year old man who is about 10 weeks out from left total hip on 02/03/2023 patient has been getting home health and finished that last week.  He continues to have pain when lifting his leg, transferring in and out of bed and the car. He needs A with lower body dressing.  He has difficulty going up and down the stairs.  He has limited his community ambulation due to fatigue and pain.  He reports a falll x 1 since surgery, about 3-4 prior to surgery.  He walks with cane when out of the house, no longer uses a walker.  He does feel  unsteady.  Pain is well controlled but does have some at night. Rt lateral thigh numbness.  His back is sore.  He thinks his leg is longer. He is unable to work, do heavy duty things at home.  He reports he does the exercises prescribed from his home health PT. He is here today with his daughter who drives him to PT.   PERTINENT HISTORY: AVN and OA L knee H/o DVT THA 02/03/23   PAIN:  Are you having pain? Yes: NPRS scale: none at rest /10 Pain location: Lt hip Pain description: tired, sore  Aggravating factors: standing, walking  Relieving factors: rest, has some pain meds  With standing long periods, leg gets tired and pain is 4/10  PRECAUTIONS: None  RED FLAGS: None   WEIGHT BEARING RESTRICTIONS: No  FALLS:  Has patient fallen in last 6 months? Yes. Number of falls 3  LIVING ENVIRONMENT: Lives with: lives with their spouse Lives in: House/apartment Stairs: Yes: External: 3-4 steps; can reach bothhas ramp  Has following equipment at home: Quad cane large base and Walker - 2 wheeled  OCCUPATION: [employer]; fork lift stand up   PLOF: Independent with basic ADLs, Independent with household mobility with device, Independent with community mobility with device, Needs assistance with ADLs, Vocation/Vocational requirements: drives forklift , and Leisure: likes to fish, watch TV   PATIENT GOALS: Walk, resume mobilty   NEXT MD VISIT: 6 weeks   OBJECTIVE:   DIAGNOSTIC FINDINGS: NT   PATIENT SURVEYS:  FOTO 40%  COGNITION: Overall cognitive status: Within functional limits for tasks assessed     SENSATION: Light touch: Impaired L thigh   EDEMA:  Min per patient n  MUSCLE LENGTH: NT   POSTURE: rounded shoulders, forward head, anterior pelvic tilt, left pelvic obliquity, and flexed trunk   PALPATION: Sore to palpation along L thigh, numb proximally along incision   LOWER EXTREMITY ROM:  Active ROM Right eval Left eval  Hip flexion  110  Hip extension    Hip  abduction    Hip adduction    Hip internal rotation  15  Hip external rotation  25  Knee flexion  130   Knee extension  0  Ankle dorsiflexion    Ankle plantarflexion    Ankle inversion    Ankle eversion     (Blank rows = not tested)  LOWER EXTREMITY MMT:  MMT Right eval Left eval  Hip flexion 4+ 4-  Hip extension    Hip abduction    Hip adduction    Hip internal rotation    Hip external rotation    Knee flexion 5 5  Knee extension 5 4+  Ankle  dorsiflexion 5 5  Ankle plantarflexion    Ankle inversion    Ankle eversion     (Blank rows = not tested)  LOWER EXTREMITY SPECIAL TESTS:  NT  FUNCTIONAL TESTS:  5 times sit to stand: 27sec wth hands, unable to push up without UEs for 1st 3 tries   GAIT: Distance walked: 100 Assistive device utilized: Quad cane large base Level of assistance: SBA Comments: mild LOB but recovered, turning head makes him unsteady   TODAY'S TREATMENT:                                                                                                                              DATE: 04/13/23    PATIENT EDUCATION:  Education details: PT/POC, HEP, progression of exercises Person educated: Patient and Child(ren) Education method: Explanation, Demonstration, Verbal cues, and Handouts Education comprehension: verbalized understanding, returned demonstration, and needs further education  HOME EXERCISE PROGRAM: Access Code: [email or web address] URL: [email or web address] Date: 04/13/2023 Prepared by: Karie Mainland  Exercises - Supine Bridge  - 1 x daily - 7 x weekly - 2 sets - 10 reps - 5 hold - Beginner Side Leg Lift  - 1 x daily - 7 x weekly - 2 sets - 10 reps - 5 hold - Hooklying Straight Leg Raise  - 1 x daily - 7 x weekly - 2 sets - 10 reps - 5 hold - Hooklying Clamshell with Resistance  - 1 x daily - 7 x weekly - 2 sets - 10 reps - 5 hold - Supine Hip Internal and External Rotation  - 1 x daily - 7 x weekly - 2 sets - 10 reps - 10  hold  ASSESSMENT:  CLINICAL IMPRESSION: Patient is a 62 y.o. male who was seen today for physical therapy evaluation and treatment for L hip replacement.   OBJECTIVE IMPAIRMENTS: Abnormal gait, decreased activity tolerance, decreased balance, decreased knowledge of use of DME, decreased mobility, difficulty walking, decreased ROM, decreased strength, increased fascial restrictions, impaired flexibility, postural dysfunction, and pain.   ACTIVITY LIMITATIONS: carrying, lifting, bending, standing, squatting, sleeping, stairs, transfers, bed mobility, dressing, and locomotion level  PARTICIPATION LIMITATIONS: cleaning, interpersonal relationship, driving, shopping, community activity, occupation, and yard work  PERSONAL FACTORS: Profession and 1 comorbidity: none   are also affecting patient's functional outcome.   REHAB POTENTIAL: Excellent  CLINICAL DECISION MAKING: Stable/uncomplicated  EVALUATION COMPLEXITY: Low   GOALS: Goals reviewed with patient? Yes  SHORT TERM GOALS: Target date: 05/11/2023   Pt will be I with HEP for L hip ROM and strength  Baseline: Goal status: INITIAL  2.  Patient will be able to transfer sit to supine without the need for hands to lift leg.  Baseline: L hip weakness, needs A  Goal status: INITIAL  3.  Patient will be able to perform sit to stand without use of hands x 10 Baseline: unable  Goal status: INITIAL  4.  Pt will be able to walk  with a cane in the clinic without cues for sequencing Baseline: needs cues  Goal status: INITIAL  5.  Pt will be screened for balance deficits and goal set  Baseline: Unable to do this on evaluation Goal status: INITIAL   LONG TERM GOALS: Target date: 06/08/2023    Patient will be independent with home program at the end of therapy Baseline: Unknown Goal status: INITIAL  2.  Foto score will improve to 66% to demonstrate functional mobility increase Baseline: 40% Goal status: INITIAL  3.  Patient  will be able to walk to the grocery store with min  increase in fatigue, pain for 30- 45 min  Baseline: limited by fatigue and pain, 20-30 min  Goal status: INITIAL  4.  Pt will demo 5/5 strength in L hip for optimal gait and mobility  Baseline: 4-/5 Goal status: INITIAL  5.  Patient will report no difficulty with car/bed transfers Baseline: needs A from hands  Goal status: INITIAL  6.  Balance goals TBA based on initial  Baseline: NA  Goal status: INITIAL   PLAN:  PT FREQUENCY: 2x/week  PT DURATION: 8 weeks  PLANNED INTERVENTIONS: Therapeutic exercises, Therapeutic activity, Neuromuscular re-education, Balance training, Gait training, Patient/Family education, Self Care, Joint mobilization, Stair training, DME instructions, Spinal mobilization, Cryotherapy, Moist heat, Manual therapy, and Re-evaluation  PLAN FOR NEXT SESSION: check HEP, gait/balance (TUG, Berg), Nustep , hip TOM and strength as needed    Cassara Nida, PT 04/13/2023, 11:58 AM   Karie Mainland, PT 04/13/23 12:19 PM Phone: [phone] Fax: [phone]

## 2023-04-14 ENCOUNTER — Telehealth: Payer: Self-pay

## 2023-04-14 ENCOUNTER — Ambulatory Visit: Payer: Medicaid Other | Admitting: Internal Medicine

## 2023-04-14 NOTE — Telephone Encounter (Signed)
Caller Name Senai Havner Caller Phone Number [phone] Patient Name [name] Patient DOB 07-05-1961 Call Type Message Only Information Provided Reason for Call Request to Reschedule Office Appointment Initial Comment Caller states he needs to reschedule his 8a appointment due to a family emergency. Patient request to speak to RN No Additional Comment Please call. Disp. Time Disposition Final User 04/14/2023 7:12:00 AM General Information Provided Yes Lucretia Roers, Amy Call Closed By: Angelique Blonder Transaction Date/Time: 04/14/2023 7:07:17 AM (ET)

## 2023-04-17 ENCOUNTER — Telehealth: Payer: Self-pay | Admitting: Orthopaedic Surgery

## 2023-04-17 NOTE — Telephone Encounter (Signed)
[employer] forms received. To [employer].

## 2023-04-20 NOTE — Telephone Encounter (Signed)
completed

## 2023-04-21 ENCOUNTER — Other Ambulatory Visit: Payer: Self-pay | Admitting: Cardiovascular Disease

## 2023-04-21 DIAGNOSIS — Z86718 Personal history of other venous thrombosis and embolism: Secondary | ICD-10-CM

## 2023-04-21 NOTE — Telephone Encounter (Signed)
Prescription refill request for Eliquis received. Indication:dvt Last office visit:4/24 Scr:0.75  6/24 Age: 62 Weight:79.6  kg  Prescription refilled

## 2023-04-24 ENCOUNTER — Telehealth: Payer: Self-pay | Admitting: Orthopaedic Surgery

## 2023-04-24 NOTE — Telephone Encounter (Signed)
04/12/23 & 04/13/23 P.T. notes faxed to [facility] [place] [phone]

## 2023-04-24 NOTE — Telephone Encounter (Signed)
Auth & $25 cash received from patient for forms. To [employer].

## 2023-04-28 ENCOUNTER — Ambulatory Visit: Payer: Medicaid Other | Admitting: Physical Therapy

## 2023-04-28 ENCOUNTER — Encounter: Payer: Self-pay | Admitting: Physical Therapy

## 2023-04-28 ENCOUNTER — Other Ambulatory Visit: Payer: Self-pay

## 2023-04-28 DIAGNOSIS — M25652 Stiffness of left hip, not elsewhere classified: Secondary | ICD-10-CM

## 2023-04-28 DIAGNOSIS — M25552 Pain in left hip: Secondary | ICD-10-CM

## 2023-04-28 DIAGNOSIS — Z96642 Presence of left artificial hip joint: Secondary | ICD-10-CM

## 2023-04-28 DIAGNOSIS — R262 Difficulty in walking, not elsewhere classified: Secondary | ICD-10-CM

## 2023-04-28 NOTE — Therapy (Signed)
OUTPATIENT PHYSICAL THERAPY TREATMENT   Patient Name: [name] MRN: [id number] DOB:Sep 20, 1960, 61 y.o., male Today's Date: 04/28/2023  END OF SESSION:  PT End of Session - 04/28/23 1114     Visit Number 2    Number of Visits 16    Date for PT Re-Evaluation 05/25/23    Authorization Type [place] [facility] [employer]    Authorization - Number of Visits 27    PT Start Time 1100    PT Stop Time 1140    PT Time Calculation (min) 40 min    Activity Tolerance Patient tolerated treatment well    Behavior During Therapy WFL for tasks assessed/performed              Past Medical History:  Diagnosis Date   Chest pain    stres test neg x 2, cath 5/12; minimal LAD irregs; no obs CAD; normal LVF   Clotting disorder (HCC)    dvt   GERD (gastroesophageal reflux disease)    History of DVT of lower extremity    on chronic coumadin   Hypertension    Internal hemorrhoids    Cscope 2007   PAD (peripheral artery disease) (HCC)    a. left leg ischemia tx wtih embolectomy of left fem, pop, tib arteries with Dr. Edilia Bo - 08/2006;  b. known occlusion of right pop with collats - med Rx;  c.ABI's 8/09: R 1.0; L 0.91    PFO (patent foramen ovale)    per 08/2006 discharge summary, TEE showed EF 60%, small PFO with minimal right-to-left shunt with Valsalva; not felt to be the source of emboli, lifelong Coumadin recommended   Pneumonia    history of   Polysubstance abuse (HCC)    marijuana only   Renal infarct (HCC)    R   Past Surgical History:  Procedure Laterality Date   CHOLECYSTECTOMY     COLONOSCOPY     INGUINAL HERNIA REPAIR Left 07/16/2019   Procedure: LAPAROSCOPIC LEFT INGUINAL HERNIA REPAIR WITH MESH;  Surgeon: Axel Filler, MD;  Location: [facility] OR;  Service: General;  Laterality: Left;   left leg blood clot removal 2009  2009   TONSILLECTOMY     TOTAL HIP ARTHROPLASTY Left 02/03/2023   Procedure: LEFT TOTAL HIP ARTHROPLASTY ANTERIOR APPROACH;  Surgeon: Tarry Kos, MD;  Location: [facility]  OR;  Service: Orthopedics;  Laterality: Left;  3-C   WRIST SURGERY Left    Patient Active Problem List   Diagnosis Date Noted   Status post total replacement of left hip 02/03/2023   Primary osteoarthritis of left hip 02/02/2023   Avascular necrosis of bone of left hip (HCC) 12/23/2022   High risk heterosexual behavior 02/05/2021   History of colonic polyps 11/04/2019   PCP NOTES >>>>>>>>>>>>>> 07/26/2016   GERD (gastroesophageal reflux disease) 07/26/2016   Annual physical exam 07/25/2016   HTN (hypertension) 09/16/2014   Nausea without vomiting 11/29/2013   Deep vein thrombosis (HCC) 10/06/2010   Long term current use of anticoagulant 10/06/2010   Renal infarct (HCC) 10/06/2010   Dyslipidemia 09/03/2009   CHEST PAIN-UNSPECIFIED 09/03/2009   SHOULDER PAIN, RIGHT 03/02/2009   PERIPHERAL VASCULAR DISEASE 02/11/2009   SKIN LESION 02/04/2008   Polysubstance abuse (HCC) 05/28/2007   DISORDER, VASCULAR, KIDNEY 05/28/2007   DVT, HX OF 12/25/2006    PCP: Wanda Plump MD   REFERRING PROVIDER: Gershon Mussel  REFERRING DIAG: [phone] (ICD-10-CM) - Status post total replacement of left hip  THERAPY DIAG:  S/P total left hip  arthroplasty  Pain in left hip  Stiffness of left hip, not elsewhere classified  Difficulty in walking, not elsewhere classified  Rationale for Evaluation and Treatment: Rehabilitation  ONSET DATE: 02/03/23  SUBJECTIVE:   SUBJECTIVE STATEMENT:  Patient reports he has been consistent with his exercises. He still gets off balance. He also reports severe pain when turning or pivoting on the left leg that shoots from the hip to the knee, and he still has to use his hands to pick up his left leg to get in the car.   PERTINENT HISTORY: AVN and OA L knee H/o DVT THA 02/03/23   PAIN:  Are you having pain? Yes: NPRS scale: 2/10 Pain location: Lt hip Pain description: tired, sore  Aggravating factors: standing, walking  Relieving factors: rest, has some pain  meds  With standing long periods, leg gets tired and pain is 4/10  PRECAUTIONS: None  RED FLAGS: None   WEIGHT BEARING RESTRICTIONS: No  FALLS:  Has patient fallen in last 6 months? Yes. Number of falls 3  LIVING ENVIRONMENT: Lives with: lives with their spouse Lives in: House/apartment Stairs: Yes: External: 3-4 steps; can reach bothhas ramp  Has following equipment at home: Quad cane large base and Walker - 2 wheeled  OCCUPATION: [employer]; fork lift stand up   PLOF: Independent with basic ADLs, Independent with household mobility with device, Independent with community mobility with device, Needs assistance with ADLs, Vocation/Vocational requirements: drives forklift , and Leisure: likes to fish, watch TV   PATIENT GOALS: Walk, resume mobilty   NEXT MD VISIT: 6 weeks   OBJECTIVE:   PATIENT SURVEYS:  FOTO 40%  SENSATION: Light touch: Impaired L thigh   EDEMA:  Min per patient n  MUSCLE LENGTH: NT   POSTURE: rounded shoulders, forward head, anterior pelvic tilt, left pelvic obliquity, and flexed trunk   PALPATION: Sore to palpation along L thigh, numb proximally along incision   LOWER EXTREMITY ROM:  Active ROM Right eval Left eval  Hip flexion  110  Hip extension    Hip abduction    Hip adduction    Hip internal rotation  15  Hip external rotation  25  Knee flexion  130   Knee extension  0  Ankle dorsiflexion    Ankle plantarflexion    Ankle inversion    Ankle eversion     (Blank rows = not tested)  LOWER EXTREMITY MMT:  MMT Right eval Left eval Left 04/27/2022  Hip flexion 4+ 4- 4-  Hip extension     Hip abduction   3  Hip adduction     Hip internal rotation     Hip external rotation     Knee flexion 5 5   Knee extension 5 4+ 4+  Ankle dorsiflexion 5 5   Ankle plantarflexion     Ankle inversion     Ankle eversion      (Blank rows = not tested)  LOWER EXTREMITY SPECIAL TESTS:  NT  FUNCTIONAL TESTS:  5 times sit to stand: 27sec wth  hands, unable to push up without UEs for 1st 3 tries   GAIT: Distance walked: 100 Assistive device utilized: Quad cane large base Level of assistance: SBA Comments: mild LOB but recovered, turning head makes him unsteady   TODAY'S TREATMENT:      [facility] Adult PT Treatment:  DATE: 04/28/2023 Therapeutic Exercise: NuStep L5 x 5 min with UE/LE while taking subjective Bridge 2 x 10 Hooklying march with green 2 x 5 each Hooklying clamshell with green 2 x 10 Supine active heel slide x 20 SLR partial range 3 x 6 with left Sidelying hip abduction 3 x 6 with left Seated march 3 x 6 with left LAQ with 5# 3 x 10 with left  PATIENT EDUCATION:  Education details: HEP Person educated: Patient and Child(ren) Education method: Programmer, multimedia, Demonstration, Verbal cues, and Handouts Education comprehension: verbalized understanding, returned demonstration, and needs further education  HOME EXERCISE PROGRAM: Access Code: [email or web address] URL: [email or web address] Date: 04/13/2023 Prepared by: Karie Mainland  Exercises - Supine Bridge  - 1 x daily - 7 x weekly - 2 sets - 10 reps - 5 hold - Beginner Side Leg Lift  - 1 x daily - 7 x weekly - 2 sets - 10 reps - 5 hold - Hooklying Straight Leg Raise  - 1 x daily - 7 x weekly - 2 sets - 10 reps - 5 hold - Hooklying Clamshell with Resistance  - 1 x daily - 7 x weekly - 2 sets - 10 reps - 5 hold - Supine Hip Internal and External Rotation  - 1 x daily - 7 x weekly - 2 sets - 10 reps - 10 hold  ASSESSMENT:  CLINICAL IMPRESSION: Patient tolerated therapy well with no adverse effects. Therapy focused on progressing left hip and LE strength. He does continue to exhibit gross left hip strength deficit and reports left hip pain with all exercises. No changes made to his HEP this visit. Patient would benefit from continued skilled PT to progress his mobility and strength in order to reduce pain and maximize  functional ability.  OBJECTIVE IMPAIRMENTS: Abnormal gait, decreased activity tolerance, decreased balance, decreased knowledge of use of DME, decreased mobility, difficulty walking, decreased ROM, decreased strength, increased fascial restrictions, impaired flexibility, postural dysfunction, and pain.   ACTIVITY LIMITATIONS: carrying, lifting, bending, standing, squatting, sleeping, stairs, transfers, bed mobility, dressing, and locomotion level  PARTICIPATION LIMITATIONS: cleaning, interpersonal relationship, driving, shopping, community activity, occupation, and yard work  PERSONAL FACTORS: Profession and 1 comorbidity: none   are also affecting patient's functional outcome.   REHAB POTENTIAL: Excellent  CLINICAL DECISION MAKING: Stable/uncomplicated  EVALUATION COMPLEXITY: Low   GOALS: Goals reviewed with patient? Yes  SHORT TERM GOALS: Target date: 05/11/2023   Pt will be I with HEP for L hip ROM and strength  Baseline: Goal status: INITIAL  2.  Patient will be able to transfer sit to supine without the need for hands to lift leg.  Baseline: L hip weakness, needs A  Goal status: INITIAL  3.  Patient will be able to perform sit to stand without use of hands x 10 Baseline: unable  Goal status: INITIAL  4.  Pt will be able to walk with a cane in the clinic without cues for sequencing Baseline: needs cues  Goal status: INITIAL  5.  Pt will be screened for balance deficits and goal set  Baseline: Unable to do this on evaluation Goal status: INITIAL   LONG TERM GOALS: Target date: 06/08/2023    Patient will be independent with home program at the end of therapy Baseline: Unknown Goal status: INITIAL  2.  Foto score will improve to 66% to demonstrate functional mobility increase Baseline: 40% Goal status: INITIAL  3.  Patient will be able to walk to the grocery store with min  increase in fatigue, pain for 30- 45 min  Baseline: limited by fatigue and pain, 20-30  min  Goal status: INITIAL  4.  Pt will demo 5/5 strength in L hip for optimal gait and mobility  Baseline: 4-/5 Goal status: INITIAL  5.  Patient will report no difficulty with car/bed transfers Baseline: needs A from hands  Goal status: INITIAL  6.  Balance goals TBA based on initial  Baseline: NA  Goal status: INITIAL   PLAN:  PT FREQUENCY: 2x/week  PT DURATION: 8 weeks  PLANNED INTERVENTIONS: Therapeutic exercises, Therapeutic activity, Neuromuscular re-education, Balance training, Gait training, Patient/Family education, Self Care, Joint mobilization, Stair training, DME instructions, Spinal mobilization, Cryotherapy, Moist heat, Manual therapy, and Re-evaluation  PLAN FOR NEXT SESSION: check HEP, gait/balance (TUG, Berg), Nustep , hip ROM and strength as needed    Rosana Hoes, PT, DPT, LAT, ATC 04/28/23  11:42 AM Phone: [phone] Fax: [phone]

## 2023-05-01 NOTE — Therapy (Unsigned)
OUTPATIENT PHYSICAL THERAPY TREATMENT   Patient Name: [name] MRN: [id number] DOB:[birth year], 62 y.o., male Today's Date: 05/02/2023  END OF SESSION:  PT End of Session - 05/02/23 1106     Visit Number 3    Number of Visits 16    Date for PT Re-Evaluation 05/25/23    Authorization Type [employer]    Authorization - Number of Visits 27    PT Start Time 1104    PT Stop Time 1145    PT Time Calculation (min) 41 min    Activity Tolerance Patient tolerated treatment well    Behavior During Therapy WFL for tasks assessed/performed               Past Medical History:  Diagnosis Date   Chest pain    stres test neg x 2, cath 5/12; minimal LAD irregs; no obs CAD; normal LVF   Clotting disorder (HCC)    dvt   GERD (gastroesophageal reflux disease)    History of DVT of lower extremity    on chronic coumadin   Hypertension    Internal hemorrhoids    Cscope 2007   PAD (peripheral artery disease) (HCC)    a. left leg ischemia tx wtih embolectomy of left fem, pop, tib arteries with Dr. Edilia Bo - 08/2006;  b. known occlusion of right pop with collats - med Rx;  c.ABI's 8/09: R 1.0; L 0.91    PFO (patent foramen ovale)    per 08/2006 discharge summary, TEE showed EF 60%, small PFO with minimal right-to-left shunt with Valsalva; not felt to be the source of emboli, lifelong Coumadin recommended   Pneumonia    history of   Polysubstance abuse (HCC)    marijuana only   Renal infarct [facility])    R   Past Surgical History:  Procedure Laterality Date   CHOLECYSTECTOMY     COLONOSCOPY     INGUINAL HERNIA REPAIR Left 07/16/2019   Procedure: LAPAROSCOPIC LEFT INGUINAL HERNIA REPAIR WITH MESH;  Surgeon: Axel Filler, MD;  Location: [facility] OR;  Service: General;  Laterality: Left;   left leg blood clot removal 2009  2009   TONSILLECTOMY     TOTAL HIP ARTHROPLASTY Left 02/03/2023   Procedure: LEFT TOTAL HIP ARTHROPLASTY ANTERIOR APPROACH;  Surgeon: Tarry Kos, MD;  Location: [facility]  OR;  Service: Orthopedics;  Laterality: Left;  3-C   WRIST SURGERY Left    Patient Active Problem List   Diagnosis Date Noted   Status post total replacement of left hip 02/03/2023   Primary osteoarthritis of left hip 02/02/2023   Avascular necrosis of bone of left hip (HCC) 12/23/2022   High risk heterosexual behavior 02/05/2021   History of colonic polyps 11/04/2019   PCP NOTES >>>>>>>>>>>>>> 07/26/2016   GERD (gastroesophageal reflux disease) 07/26/2016   Annual physical exam 07/25/2016   HTN (hypertension) 09/16/2014   Nausea without vomiting 11/29/2013   Deep vein thrombosis (HCC) 10/06/2010   Long term current use of anticoagulant 10/06/2010   Renal infarct (HCC) 10/06/2010   Dyslipidemia 09/03/2009   CHEST PAIN-UNSPECIFIED 09/03/2009   SHOULDER PAIN, RIGHT 03/02/2009   PERIPHERAL VASCULAR DISEASE 02/11/2009   SKIN LESION 02/04/2008   Polysubstance abuse (HCC) 05/28/2007   DISORDER, VASCULAR, KIDNEY 05/28/2007   DVT, HX OF 12/25/2006    PCP: Wanda Plump MD   REFERRING PROVIDER: Gershon Mussel  REFERRING DIAG: [phone] (ICD-10-CM) - Status post total replacement of left hip  THERAPY DIAG:  S/P total left  hip arthroplasty  Pain in left hip  Stiffness of left hip, not elsewhere classified  Difficulty in walking, not elsewhere classified  Rationale for Evaluation and Treatment: Rehabilitation  ONSET DATE: 02/03/23  SUBJECTIVE:   SUBJECTIVE STATEMENT:  No pain right now.  Doing ok, no new complaints.   PERTINENT HISTORY: AVN and OA L knee H/o DVT THA 02/03/23   PAIN:  Are you having pain? Yes: NPRS scale: 0-1/10 Pain location: Lt hip Pain description: tired, sore  Aggravating factors: standing, walking  Relieving factors: rest, has some pain meds  With standing long periods, leg gets tired and pain is 4/10  PRECAUTIONS: None  RED FLAGS: None   WEIGHT BEARING RESTRICTIONS: No  FALLS:  Has patient fallen in last 6 months? Yes. Number of falls  3  LIVING ENVIRONMENT: Lives with: lives with their spouse Lives in: House/apartment Stairs: Yes: External: 3-4 steps; can reach bothhas ramp  Has following equipment at home: Quad cane large base and Walker - 2 wheeled  OCCUPATION: [employer]; fork lift stand up   PLOF: Independent with basic ADLs, Independent with household mobility with device, Independent with community mobility with device, Needs assistance with ADLs, Vocation/Vocational requirements: drives forklift , and Leisure: likes to fish, watch TV   PATIENT GOALS: Walk, resume mobilty   NEXT MD VISIT: 6 weeks   OBJECTIVE:   PATIENT SURVEYS:  FOTO 40%  SENSATION: Light touch: Impaired L thigh   EDEMA:  Min per patient n  MUSCLE LENGTH: NT   POSTURE: rounded shoulders, forward head, anterior pelvic tilt, left pelvic obliquity, and flexed trunk   PALPATION: Sore to palpation along L thigh, numb proximally along incision   LOWER EXTREMITY ROM:  Active ROM Right eval Left eval  Hip flexion  110  Hip extension    Hip abduction    Hip adduction    Hip internal rotation  15  Hip external rotation  25  Knee flexion  130   Knee extension  0  Ankle dorsiflexion    Ankle plantarflexion    Ankle inversion    Ankle eversion     (Blank rows = not tested)  LOWER EXTREMITY MMT:  MMT Right eval Left eval Left 04/27/2022  Hip flexion 4+ 4- 4-  Hip extension     Hip abduction   3  Hip adduction     Hip internal rotation     Hip external rotation     Knee flexion 5 5   Knee extension 5 4+ 4+  Ankle dorsiflexion 5 5   Ankle plantarflexion     Ankle inversion     Ankle eversion      (Blank rows = not tested)  LOWER EXTREMITY SPECIAL TESTS:  NT  FUNCTIONAL TESTS:  5 times sit to stand: 27sec wth hands, unable to push up without UEs for 1st 3 tries   GAIT: Distance walked: 100 Assistive device utilized: Quad cane large base Level of assistance: SBA Comments: mild LOB but recovered, turning head makes  him unsteady   TODAY'S TREATMENT:       [facility] Adult PT Treatment:                                                DATE: 05/02/23 Therapeutic Exercise: NuStep L5 UE and LE for 5 min  Sidelying ant hip stretch  Hip abduction  lift x 10 , 2 sets 1 set with bent knee (hydrant) Clam x 10 no band then x 10 green band  Bridge 2 x 10 with band  Clam double leg x 15  March hip flex green band x   10 Prop on elbow quad set to SLR x 10  LLE stretch off table with knee flexed for quad and ant hip stretch  Light push into knee flexion  Therapeutic Activity: 2 min walk 297 feet , no increased pain just worn out  TUG: with cane 17.7 sec, then 13.2 sec 2nd attempt.  Done without cane 12.5 sec    [facility] Adult PT Treatment:                                                DATE: 04/28/2023 Therapeutic Exercise: NuStep L5 x 5 min with UE/LE while taking subjective Bridge 2 x 10 Hooklying march with green 2 x 5 each Hooklying clamshell with green 2 x 10 Supine active heel slide x 20 SLR partial range 3 x 6 with left Sidelying hip abduction 3 x 6 with left Seated march 3 x 6 with left LAQ with 5# 3 x 10 with left  PATIENT EDUCATION:  Education details: HEP Person educated: Patient and Child(ren) Education method: Programmer, multimedia, Demonstration, Verbal cues, and Handouts Education comprehension: verbalized understanding, returned demonstration, and needs further education  HOME EXERCISE PROGRAM: Access Code: [email or web address] URL: [email or web address] Date: 04/13/2023 Prepared by: Karie Mainland  Exercises - Supine Bridge  - 1 x daily - 7 x weekly - 2 sets - 10 reps - 5 hold - Beginner Side Leg Lift  - 1 x daily - 7 x weekly - 2 sets - 10 reps - 5 hold - Hooklying Straight Leg Raise  - 1 x daily - 7 x weekly - 2 sets - 10 reps - 5 hold - Hooklying Clamshell with Resistance  - 1 x daily - 7 x weekly - 2 sets - 10 reps - 5 hold - Supine Hip Internal and External Rotation  - 1 x daily - 7 x weekly - 2  sets - 10 reps - 10 hold  ASSESSMENT:  CLINICAL IMPRESSION: Patient tolerated therapy well today, focusing on stretching L anterior hip and strength throughout hip.  Baselines established for walking and TUG.  Pt with significant limp with and without cane. He lacks full hip, trunk extension in standing. Will move to more standing exercises as tolerated. Patient would benefit from continued skilled PT to progress his mobility and strength in order to reduce pain and maximize functional ability.  OBJECTIVE IMPAIRMENTS: Abnormal gait, decreased activity tolerance, decreased balance, decreased knowledge of use of DME, decreased mobility, difficulty walking, decreased ROM, decreased strength, increased fascial restrictions, impaired flexibility, postural dysfunction, and pain.   ACTIVITY LIMITATIONS: carrying, lifting, bending, standing, squatting, sleeping, stairs, transfers, bed mobility, dressing, and locomotion level  PARTICIPATION LIMITATIONS: cleaning, interpersonal relationship, driving, shopping, community activity, occupation, and yard work  PERSONAL FACTORS: Profession and 1 comorbidity: none   are also affecting patient's functional outcome.   REHAB POTENTIAL: Excellent  CLINICAL DECISION MAKING: Stable/uncomplicated  EVALUATION COMPLEXITY: Low   GOALS: Goals reviewed with patient? Yes  SHORT TERM GOALS: Target date: 05/11/2023   Pt will be I with HEP for L hip ROM and strength  Baseline: Goal status: ongoing  2.  Patient  will be able to transfer sit to supine without the need for hands to lift leg.  Baseline: L hip weakness, needs A  Goal status: INITIAL  3.  Patient will be able to perform sit to stand without use of hands x 10 Baseline: unable  Goal status: INITIAL  4.  Pt will be able to walk with a cane in the clinic without cues for sequencing Baseline: needs cues  Goal status: INITIAL  5.  Pt will be screened for balance deficits and goal set  Baseline: Unable  to do this on evaluation Goal status: INITIAL   LONG TERM GOALS: Target date: 06/08/2023    Patient will be independent with home program at the end of therapy Baseline: Unknown Goal status: INITIAL  2.  Foto score will improve to 66% to demonstrate functional mobility increase Baseline: 40% Goal status: INITIAL  3.  Patient will be able to walk to the grocery store with min  increase in fatigue, pain for 30- 45 min  Baseline: limited by fatigue and pain, 20-30 min  Goal status: INITIAL  4.  Pt will demo 5/5 strength in L hip for optimal gait and mobility  Baseline: 4-/5 Goal status: INITIAL  5.  Patient will report no difficulty with car/bed transfers Baseline: needs A from hands  Goal status: INITIAL  6.  Pt will be able to complete 2 min walk test without cane, 350 feet  Baseline: 297 with cane  Goal status: ongoing   PLAN:  PT FREQUENCY: 2x/week  PT DURATION: 8 weeks  PLANNED INTERVENTIONS: Therapeutic exercises, Therapeutic activity, Neuromuscular re-education, Balance training, Gait training, Patient/Family education, Self Care, Joint mobilization, Stair training, DME instructions, Spinal mobilization, Cryotherapy, Moist heat, Manual therapy, and Re-evaluation  PLAN FOR NEXT SESSION: check HEP, gait/balance (TUG, Berg), Nustep , hip ROM and strength as needed   Karie Mainland, PT 05/02/23 12:02 PM Phone: [phone] Fax: [phone]

## 2023-05-02 ENCOUNTER — Encounter: Payer: Self-pay | Admitting: Physical Therapy

## 2023-05-02 ENCOUNTER — Ambulatory Visit: Payer: Medicaid Other | Admitting: Physical Therapy

## 2023-05-02 DIAGNOSIS — R262 Difficulty in walking, not elsewhere classified: Secondary | ICD-10-CM

## 2023-05-02 DIAGNOSIS — M25652 Stiffness of left hip, not elsewhere classified: Secondary | ICD-10-CM

## 2023-05-02 DIAGNOSIS — Z96642 Presence of left artificial hip joint: Secondary | ICD-10-CM

## 2023-05-02 DIAGNOSIS — M25552 Pain in left hip: Secondary | ICD-10-CM | POA: Diagnosis not present

## 2023-05-09 ENCOUNTER — Ambulatory Visit: Payer: Medicaid Other | Attending: Orthopaedic Surgery | Admitting: Physical Therapy

## 2023-05-09 ENCOUNTER — Encounter: Payer: Self-pay | Admitting: Physical Therapy

## 2023-05-09 DIAGNOSIS — Z96642 Presence of left artificial hip joint: Secondary | ICD-10-CM | POA: Diagnosis present

## 2023-05-09 DIAGNOSIS — R262 Difficulty in walking, not elsewhere classified: Secondary | ICD-10-CM | POA: Insufficient documentation

## 2023-05-09 DIAGNOSIS — M25552 Pain in left hip: Secondary | ICD-10-CM | POA: Diagnosis present

## 2023-05-09 DIAGNOSIS — M25652 Stiffness of left hip, not elsewhere classified: Secondary | ICD-10-CM | POA: Diagnosis present

## 2023-05-09 NOTE — Therapy (Signed)
OUTPATIENT PHYSICAL THERAPY TREATMENT   Patient Name: [name] MRN: [id number] DOB:1960-11-23, 62 y.o., male Today's Date: 05/09/2023  END OF SESSION:  PT End of Session - 05/09/23 1054     Visit Number 4    Number of Visits 16    Date for PT Re-Evaluation 05/25/23    Authorization Type [employer]    Authorization - Number of Visits 27    PT Start Time 1049    PT Stop Time 1145    PT Time Calculation (min) 56 min    Activity Tolerance Patient tolerated treatment well    Behavior During Therapy WFL for tasks assessed/performed                Past Medical History:  Diagnosis Date   Chest pain    stres test neg x 2, cath 5/12; minimal LAD irregs; no obs CAD; normal LVF   Clotting disorder (HCC)    dvt   GERD (gastroesophageal reflux disease)    History of DVT of lower extremity    on chronic coumadin   Hypertension    Internal hemorrhoids    Cscope 2007   PAD (peripheral artery disease) (HCC)    a. left leg ischemia tx wtih embolectomy of left fem, pop, tib arteries with Dr. Edilia Bo - 08/2006;  b. known occlusion of right pop with collats - med Rx;  c.ABI's 8/09: R 1.0; L 0.91    PFO (patent foramen ovale)    per 08/2006 discharge summary, TEE showed EF 60%, small PFO with minimal right-to-left shunt with Valsalva; not felt to be the source of emboli, lifelong Coumadin recommended   Pneumonia    history of   Polysubstance abuse (HCC)    marijuana only   Renal infarct [facility])    R   Past Surgical History:  Procedure Laterality Date   CHOLECYSTECTOMY     COLONOSCOPY     INGUINAL HERNIA REPAIR Left 07/16/2019   Procedure: LAPAROSCOPIC LEFT INGUINAL HERNIA REPAIR WITH MESH;  Surgeon: Axel Filler, MD;  Location: [facility] OR;  Service: General;  Laterality: Left;   left leg blood clot removal 2009  2009   TONSILLECTOMY     TOTAL HIP ARTHROPLASTY Left 02/03/2023   Procedure: LEFT TOTAL HIP ARTHROPLASTY ANTERIOR APPROACH;  Surgeon: Tarry Kos, MD;  Location:  [facility] OR;  Service: Orthopedics;  Laterality: Left;  3-C   WRIST SURGERY Left    Patient Active Problem List   Diagnosis Date Noted   Status post total replacement of left hip 02/03/2023   Primary osteoarthritis of left hip 02/02/2023   Avascular necrosis of bone of left hip (HCC) 12/23/2022   High risk heterosexual behavior 02/05/2021   History of colonic polyps 11/04/2019   PCP NOTES >>>>>>>>>>>>>> 07/26/2016   GERD (gastroesophageal reflux disease) 07/26/2016   Annual physical exam 07/25/2016   HTN (hypertension) 09/16/2014   Nausea without vomiting 11/29/2013   Deep vein thrombosis (HCC) 10/06/2010   Long term current use of anticoagulant 10/06/2010   Renal infarct (HCC) 10/06/2010   Dyslipidemia 09/03/2009   CHEST PAIN-UNSPECIFIED 09/03/2009   SHOULDER PAIN, RIGHT 03/02/2009   PERIPHERAL VASCULAR DISEASE 02/11/2009   SKIN LESION 02/04/2008   Polysubstance abuse (HCC) 05/28/2007   DISORDER, VASCULAR, KIDNEY 05/28/2007   DVT, HX OF 12/25/2006    PCP: Wanda Plump MD   REFERRING PROVIDER: Gershon Mussel  REFERRING DIAG: [phone] (ICD-10-CM) - Status post total replacement of left hip  THERAPY DIAG:  S/P total  left hip arthroplasty  Pain in left hip  Stiffness of left hip, not elsewhere classified  Difficulty in walking, not elsewhere classified  Rationale for Evaluation and Treatment: Rehabilitation  ONSET DATE: 02/03/23  SUBJECTIVE:   SUBJECTIVE STATEMENT:  No pain right now.  Doing ok, no new complaints.   PERTINENT HISTORY: AVN and OA L knee H/o DVT THA 02/03/23   PAIN:  Are you having pain? Yes: NPRS scale: 0-1/10 Pain location: Lt hip Pain description: tired, sore  Aggravating factors: standing, walking  Relieving factors: rest, has some pain meds  With standing long periods, leg gets tired and pain is 4/10  PRECAUTIONS: None  RED FLAGS: None   WEIGHT BEARING RESTRICTIONS: No  FALLS:  Has patient fallen in last 6 months? Yes. Number of falls  3  LIVING ENVIRONMENT: Lives with: lives with their spouse Lives in: House/apartment Stairs: Yes: External: 3-4 steps; can reach bothhas ramp  Has following equipment at home: Quad cane large base and Walker - 2 wheeled  OCCUPATION: [employer]; fork lift stand up   PLOF: Independent with basic ADLs, Independent with household mobility with device, Independent with community mobility with device, Needs assistance with ADLs, Vocation/Vocational requirements: drives forklift , and Leisure: likes to fish, watch TV   PATIENT GOALS: Walk, resume mobilty   NEXT MD VISIT: 6 weeks   OBJECTIVE:   PATIENT SURVEYS:  FOTO 40%  SENSATION: Light touch: Impaired L thigh   EDEMA:  Min per patient   MUSCLE LENGTH: NT   POSTURE: rounded shoulders, forward head, anterior pelvic tilt, left pelvic obliquity, and flexed trunk   PALPATION: Sore to palpation along L thigh, numb proximally along incision   LOWER EXTREMITY ROM:  Active ROM Right eval Left eval  Hip flexion  110  Hip extension    Hip abduction    Hip adduction    Hip internal rotation  15  Hip external rotation  25  Knee flexion  130   Knee extension  0  Ankle dorsiflexion    Ankle plantarflexion    Ankle inversion    Ankle eversion     (Blank rows = not tested)  LOWER EXTREMITY MMT:  MMT Right eval Left eval Left 04/27/2022  Hip flexion 4+ 4- 4-  Hip extension     Hip abduction   3  Hip adduction     Hip internal rotation     Hip external rotation     Knee flexion 5 5   Knee extension 5 4+ 4+  Ankle dorsiflexion 5 5   Ankle plantarflexion     Ankle inversion     Ankle eversion      (Blank rows = not tested)  LOWER EXTREMITY SPECIAL TESTS:  NT  FUNCTIONAL TESTS:  5 times sit to stand: 27sec wth hands, unable to push up without UEs for 1st 3 tries   GAIT: Distance walked: 100 Assistive device utilized: Quad cane large base Level of assistance: SBA Comments: mild LOB but recovered, turning head makes  him unsteady   TODAY'S TREATMENT:        [facility] Adult PT Treatment:                                                DATE: 05/09/23 Therapeutic Exercise: NuStep L 5 Ue and LE for 6 min  Standing at wall, isometric  hold x 30 sec, added opp heel lift and hold 15 sec  Corner for balance on Airex : march, head turns, nods  Sit to stand Airex on mat 2 x 10, used 10 lbs for 2nd set  Seated march blue band  Supine bridge blue band x 10 wide knees  Alt. Blue band knee fall out x 10 Bridge and alt. Knee fall out 3 x 5 reps blue band Clam blue band x 10  Hip abduction x 10  Standing step ups x 10 , 6 inch with min A bilateral UEs  Modalities: MHP 6 min L ant hip.     [facility] Adult PT Treatment:                                                DATE: 05/02/23 Therapeutic Exercise: NuStep L5 UE and LE for 5 min  Sidelying ant hip stretch  Hip abduction lift x 10 , 2 sets 1 set with bent knee (hydrant) Clam x 10 no band then x 10 green band  Bridge 2 x 10 with band  Clam double leg x 15  March hip flex green band x   10 Prop on elbow quad set to SLR x 10  LLE stretch off table with knee flexed for quad and ant hip stretch  Light push into knee flexion  Therapeutic Activity: 2 min walk 297 feet , no increased pain just "worn out" TUG: with cane 17.7 sec, then 13.2 sec 2nd attempt.  Done without cane 12.5 sec    [facility] Adult PT Treatment:                                                DATE: 04/28/2023 Therapeutic Exercise: NuStep L5 x 5 min with UE/LE while taking subjective Bridge 2 x 10 Hooklying march with green 2 x 5 each Hooklying clamshell with green 2 x 10 Supine active heel slide x 20 SLR partial range 3 x 6 with left Sidelying hip abduction 3 x 6 with left Seated march 3 x 6 with left LAQ with 5# 3 x 10 with left  PATIENT EDUCATION:  Education details: HEP Person educated: Patient and Child(ren) Education method: Programmer, multimedia, Demonstration, Verbal cues, and Handouts Education  comprehension: verbalized understanding, returned demonstration, and needs further education  HOME EXERCISE PROGRAM: Access Code: [email or web address] URL: [email or web address] Date: 04/13/2023 Prepared by: Karie Mainland  Exercises - Supine Bridge  - 1 x daily - 7 x weekly - 2 sets - 10 reps - 5 hold - Beginner Side Leg Lift  - 1 x daily - 7 x weekly - 2 sets - 10 reps - 5 hold - Hooklying Straight Leg Raise  - 1 x daily - 7 x weekly - 2 sets - 10 reps - 5 hold - Hooklying Clamshell with Resistance  - 1 x daily - 7 x weekly - 2 sets - 10 reps - 5 hold - Supine Hip Internal and External Rotation  - 1 x daily - 7 x weekly - 2 sets - 10 reps - 10 hold -see medbridge    ASSESSMENT:  CLINICAL IMPRESSION: Patient able to make progress towards goals of lifting operated leg without need for assistance. Can perform sit  to stand but with about 1.5 inch boost from the Airex pad on the table. He needed min A to step onto 6 inch step due to weakness. He will cont to benefit from skilled PT to improved functional strength. MHP post session offered to reduce soreness in L hip.    OBJECTIVE IMPAIRMENTS: Abnormal gait, decreased activity tolerance, decreased balance, decreased knowledge of use of DME, decreased mobility, difficulty walking, decreased ROM, decreased strength, increased fascial restrictions, impaired flexibility, postural dysfunction, and pain.   ACTIVITY LIMITATIONS: carrying, lifting, bending, standing, squatting, sleeping, stairs, transfers, bed mobility, dressing, and locomotion level  PARTICIPATION LIMITATIONS: cleaning, interpersonal relationship, driving, shopping, community activity, occupation, and yard work  PERSONAL FACTORS: Profession and 1 comorbidity: none   are also affecting patient's functional outcome.   REHAB POTENTIAL: Excellent  CLINICAL DECISION MAKING: Stable/uncomplicated  EVALUATION COMPLEXITY: Low   GOALS: Goals reviewed with patient? Yes  SHORT TERM  GOALS: Target date: 05/11/2023   Pt will be I with HEP for L hip ROM and strength  Baseline: Goal status: ongoing  2.  Patient will be able to transfer sit to supine without the need for hands to lift leg.  Baseline: L hip weakness, needs A . Update: he can do this sometimes, still a strain Goal status: ongoing   3.  Patient will be able to perform sit to stand without use of hands x 10 Baseline: unable . Update : done off standard mat table with AIREX under hips  Goal status: ongoing  4.  Pt will be able to walk with a cane in the clinic without cues for sequencing Baseline: needs cues  Goal status: MET   5.  Pt will be screened for balance deficits and goal set  Baseline: Unable to do this on evaluation Goal status: MET    LONG TERM GOALS: Target date: 06/08/2023    Patient will be independent with home program at the end of therapy Baseline: Unknown Goal status: INITIAL  2.  Foto score will improve to 66% to demonstrate functional mobility increase Baseline: 40% Goal status: INITIAL  3.  Patient will be able to walk to the grocery store with min  increase in fatigue, pain for 30- 45 min  Baseline: limited by fatigue and pain, 20-30 min  Goal status: INITIAL  4.  Pt will demo 5/5 strength in L hip for optimal gait and mobility  Baseline: 4-/5 Goal status: INITIAL  5.  Patient will report no difficulty with car/bed transfers Baseline: needs A from hands  Goal status: INITIAL  6.  Pt will be able to complete 2 min walk test without cane, 350 feet  Baseline: 297 with cane  Goal status: ongoing   PLAN:  PT FREQUENCY: 2x/week  PT DURATION: 8 weeks  PLANNED INTERVENTIONS: Therapeutic exercises, Therapeutic activity, Neuromuscular re-education, Balance training, Gait training, Patient/Family education, Self Care, Joint mobilization, Stair training, DME instructions, Spinal mobilization, Cryotherapy, Moist heat, Manual therapy, and Re-evaluation  PLAN FOR NEXT  SESSION: gait, endurance, standing: step ups, hip abd and ext strength , add banded hip ex.   Karie Mainland, PT 05/09/23 11:49 AM Phone: [phone] Fax: [phone]

## 2023-05-11 ENCOUNTER — Ambulatory Visit: Payer: Medicaid Other | Admitting: Physical Therapy

## 2023-05-11 ENCOUNTER — Telehealth: Payer: Self-pay | Admitting: Physical Therapy

## 2023-05-11 NOTE — Telephone Encounter (Signed)
Called patient regarding his missed apt today at 11:00.  Unfortunately I was unable to leave a voicemail due to his inbox being full. Will remind him at his next appointment of the attendance policy. Karie Mainland, PT 05/11/23 11:23 AM Phone: [phone] Fax: [phone]

## 2023-05-11 NOTE — Therapy (Deleted)
OUTPATIENT PHYSICAL THERAPY TREATMENT   Patient Name: [name] MRN: [id number] DOB:01-26-1961, 62 y.o., male Today's Date: 05/11/2023  END OF SESSION:       Past Medical History:  Diagnosis Date   Chest pain    stres test neg x 2, cath 5/12; minimal LAD irregs; no obs CAD; normal LVF   Clotting disorder (HCC)    dvt   GERD (gastroesophageal reflux disease)    History of DVT of lower extremity    on chronic coumadin   Hypertension    Internal hemorrhoids    Cscope 2007   PAD (peripheral artery disease) (HCC)    a. left leg ischemia tx wtih embolectomy of left fem, pop, tib arteries with Dr. Edilia Bo - 08/2006;  b. known occlusion of right pop with collats - med Rx;  c.ABI's 8/09: R 1.0; L 0.91    PFO (patent foramen ovale)    per 08/2006 discharge summary, TEE showed EF 60%, small PFO with minimal right-to-left shunt with Valsalva; not felt to be the source of emboli, lifelong Coumadin recommended   Pneumonia    history of   Polysubstance abuse (HCC)    marijuana only   Renal infarct [facility])    R   Past Surgical History:  Procedure Laterality Date   CHOLECYSTECTOMY     COLONOSCOPY     INGUINAL HERNIA REPAIR Left 07/16/2019   Procedure: LAPAROSCOPIC LEFT INGUINAL HERNIA REPAIR WITH MESH;  Surgeon: Axel Filler, MD;  Location: [facility] OR;  Service: General;  Laterality: Left;   left leg blood clot removal 2009  2009   TONSILLECTOMY     TOTAL HIP ARTHROPLASTY Left 02/03/2023   Procedure: LEFT TOTAL HIP ARTHROPLASTY ANTERIOR APPROACH;  Surgeon: Tarry Kos, MD;  Location: [facility] OR;  Service: Orthopedics;  Laterality: Left;  3-C   WRIST SURGERY Left    Patient Active Problem List   Diagnosis Date Noted   Status post total replacement of left hip 02/03/2023   Primary osteoarthritis of left hip 02/02/2023   Avascular necrosis of bone of left hip (HCC) 12/23/2022   High risk heterosexual behavior 02/05/2021   History of colonic polyps 11/04/2019   PCP NOTES  >>>>>>>>>>>>>> 07/26/2016   GERD (gastroesophageal reflux disease) 07/26/2016   Annual physical exam 07/25/2016   HTN (hypertension) 09/16/2014   Nausea without vomiting 11/29/2013   Deep vein thrombosis (HCC) 10/06/2010   Long term current use of anticoagulant 10/06/2010   Renal infarct (HCC) 10/06/2010   Dyslipidemia 09/03/2009   CHEST PAIN-UNSPECIFIED 09/03/2009   SHOULDER PAIN, RIGHT 03/02/2009   PERIPHERAL VASCULAR DISEASE 02/11/2009   SKIN LESION 02/04/2008   Polysubstance abuse (HCC) 05/28/2007   DISORDER, VASCULAR, KIDNEY 05/28/2007   DVT, HX OF 12/25/2006    PCP: Wanda Plump MD   REFERRING PROVIDER: Gershon Mussel  REFERRING DIAG: [phone] (ICD-10-CM) - Status post total replacement of left hip  THERAPY DIAG:  No diagnosis found.  Rationale for Evaluation and Treatment: Rehabilitation  ONSET DATE: 02/03/23  SUBJECTIVE:   SUBJECTIVE STATEMENT:  No pain right now.  Doing ok, no new complaints.   PERTINENT HISTORY: AVN and OA L knee H/o DVT THA 02/03/23   PAIN:  Are you having pain? Yes: NPRS scale: 0-1/10 Pain location: Lt hip Pain description: tired, sore  Aggravating factors: standing, walking  Relieving factors: rest, has some pain meds  With standing long periods, leg gets tired and pain is 4/10  PRECAUTIONS: None  RED FLAGS: None   WEIGHT BEARING  RESTRICTIONS: No  FALLS:  Has patient fallen in last 6 months? Yes. Number of falls 3  LIVING ENVIRONMENT: Lives with: lives with their spouse Lives in: House/apartment Stairs: Yes: External: 3-4 steps; can reach bothhas ramp  Has following equipment at home: Quad cane large base and Walker - 2 wheeled  OCCUPATION: [employer]; fork lift stand up   PLOF: Independent with basic ADLs, Independent with household mobility with device, Independent with community mobility with device, Needs assistance with ADLs, Vocation/Vocational requirements: drives forklift , and Leisure: likes to fish, watch TV   PATIENT  GOALS: Walk, resume mobilty   NEXT MD VISIT: 6 weeks   OBJECTIVE:   PATIENT SURVEYS:  FOTO 40%  SENSATION: Light touch: Impaired L thigh   EDEMA:  Min per patient   MUSCLE LENGTH: NT   POSTURE: rounded shoulders, forward head, anterior pelvic tilt, left pelvic obliquity, and flexed trunk   PALPATION: Sore to palpation along L thigh, numb proximally along incision   LOWER EXTREMITY ROM:  Active ROM Right eval Left eval  Hip flexion  110  Hip extension    Hip abduction    Hip adduction    Hip internal rotation  15  Hip external rotation  25  Knee flexion  130   Knee extension  0  Ankle dorsiflexion    Ankle plantarflexion    Ankle inversion    Ankle eversion     (Blank rows = not tested)  LOWER EXTREMITY MMT:  MMT Right eval Left eval Left 04/27/2022  Hip flexion 4+ 4- 4-  Hip extension     Hip abduction   3  Hip adduction     Hip internal rotation     Hip external rotation     Knee flexion 5 5   Knee extension 5 4+ 4+  Ankle dorsiflexion 5 5   Ankle plantarflexion     Ankle inversion     Ankle eversion      (Blank rows = not tested)  LOWER EXTREMITY SPECIAL TESTS:  NT  FUNCTIONAL TESTS:  5 times sit to stand: 27sec wth hands, unable to push up without UEs for 1st 3 tries   GAIT: Distance walked: 100 Assistive device utilized: Quad cane large base Level of assistance: SBA Comments: mild LOB but recovered, turning head makes him unsteady   TODAY'S TREATMENT:        [facility] Adult PT Treatment:                                                DATE: 05/09/23 Therapeutic Exercise: NuStep L 5 Ue and LE for 6 min  Standing at wall, isometric hold x 30 sec, added opp heel lift and hold 15 sec  Corner for balance on Airex : march, head turns, nods  Sit to stand Airex on mat 2 x 10, used 10 lbs for 2nd set  Seated march blue band  Supine bridge blue band x 10 wide knees  Alt. Blue band knee fall out x 10 Bridge and alt. Knee fall out 3 x 5 reps blue  band Clam blue band x 10  Hip abduction x 10  Standing step ups x 10 , 6 inch with min A bilateral UEs  Modalities: MHP 6 min L ant hip.     [facility] Adult PT Treatment:  DATE: 05/02/23 Therapeutic Exercise: NuStep L5 UE and LE for 5 min  Sidelying ant hip stretch  Hip abduction lift x 10 , 2 sets 1 set with bent knee (hydrant) Clam x 10 no band then x 10 green band  Bridge 2 x 10 with band  Clam double leg x 15  March hip flex green band x   10 Prop on elbow quad set to SLR x 10  LLE stretch off table with knee flexed for quad and ant hip stretch  Light push into knee flexion  Therapeutic Activity: 2 min walk 297 feet , no increased pain just "worn out" TUG: with cane 17.7 sec, then 13.2 sec 2nd attempt.  Done without cane 12.5 sec    [facility] Adult PT Treatment:                                                DATE: 04/28/2023 Therapeutic Exercise: NuStep L5 x 5 min with UE/LE while taking subjective Bridge 2 x 10 Hooklying march with green 2 x 5 each Hooklying clamshell with green 2 x 10 Supine active heel slide x 20 SLR partial range 3 x 6 with left Sidelying hip abduction 3 x 6 with left Seated march 3 x 6 with left LAQ with 5# 3 x 10 with left  PATIENT EDUCATION:  Education details: HEP Person educated: Patient and Child(ren) Education method: Programmer, multimedia, Demonstration, Verbal cues, and Handouts Education comprehension: verbalized understanding, returned demonstration, and needs further education  HOME EXERCISE PROGRAM: Access Code: [email or web address] URL: [email or web address] Date: 04/13/2023 Prepared by: Karie Mainland  Exercises - Supine Bridge  - 1 x daily - 7 x weekly - 2 sets - 10 reps - 5 hold - Beginner Side Leg Lift  - 1 x daily - 7 x weekly - 2 sets - 10 reps - 5 hold - Hooklying Straight Leg Raise  - 1 x daily - 7 x weekly - 2 sets - 10 reps - 5 hold - Hooklying Clamshell with Resistance  - 1 x daily - 7 x  weekly - 2 sets - 10 reps - 5 hold - Supine Hip Internal and External Rotation  - 1 x daily - 7 x weekly - 2 sets - 10 reps - 10 hold -see medbridge    ASSESSMENT:  CLINICAL IMPRESSION: Patient able to make progress towards goals of lifting operated leg without need for assistance. Can perform sit to stand but with about 1.5 inch boost from the Airex pad on the table. He needed min A to step onto 6 inch step due to weakness. He will cont to benefit from skilled PT to improved functional strength. MHP post session offered to reduce soreness in L hip.    OBJECTIVE IMPAIRMENTS: Abnormal gait, decreased activity tolerance, decreased balance, decreased knowledge of use of DME, decreased mobility, difficulty walking, decreased ROM, decreased strength, increased fascial restrictions, impaired flexibility, postural dysfunction, and pain.   ACTIVITY LIMITATIONS: carrying, lifting, bending, standing, squatting, sleeping, stairs, transfers, bed mobility, dressing, and locomotion level  PARTICIPATION LIMITATIONS: cleaning, interpersonal relationship, driving, shopping, community activity, occupation, and yard work  PERSONAL FACTORS: Profession and 1 comorbidity: none   are also affecting patient's functional outcome.   REHAB POTENTIAL: Excellent  CLINICAL DECISION MAKING: Stable/uncomplicated  EVALUATION COMPLEXITY: Low   GOALS: Goals reviewed with patient? Yes  SHORT TERM GOALS: Target date:  05/11/2023   Pt will be I with HEP for L hip ROM and strength  Baseline: Goal status: ongoing  2.  Patient will be able to transfer sit to supine without the need for hands to lift leg.  Baseline: L hip weakness, needs A . Update: he can do this sometimes, still a strain Goal status: ongoing   3.  Patient will be able to perform sit to stand without use of hands x 10 Baseline: unable . Update : done off standard mat table with AIREX under hips  Goal status: ongoing  4.  Pt will be able to walk with  a cane in the clinic without cues for sequencing Baseline: needs cues  Goal status: MET   5.  Pt will be screened for balance deficits and goal set  Baseline: Unable to do this on evaluation Goal status: MET    LONG TERM GOALS: Target date: 06/08/2023    Patient will be independent with home program at the end of therapy Baseline: Unknown Goal status: INITIAL  2.  Foto score will improve to 66% to demonstrate functional mobility increase Baseline: 40% Goal status: INITIAL  3.  Patient will be able to walk to the grocery store with min  increase in fatigue, pain for 30- 45 min  Baseline: limited by fatigue and pain, 20-30 min  Goal status: INITIAL  4.  Pt will demo 5/5 strength in L hip for optimal gait and mobility  Baseline: 4-/5 Goal status: INITIAL  5.  Patient will report no difficulty with car/bed transfers Baseline: needs A from hands  Goal status: INITIAL  6.  Pt will be able to complete 2 min walk test without cane, 350 feet  Baseline: 297 with cane  Goal status: ongoing   PLAN:  PT FREQUENCY: 2x/week  PT DURATION: 8 weeks  PLANNED INTERVENTIONS: Therapeutic exercises, Therapeutic activity, Neuromuscular re-education, Balance training, Gait training, Patient/Family education, Self Care, Joint mobilization, Stair training, DME instructions, Spinal mobilization, Cryotherapy, Moist heat, Manual therapy, and Re-evaluation  PLAN FOR NEXT SESSION: gait, endurance, standing: step ups, hip abd and ext strength , add banded hip ex.   Karie Mainland, PT 05/11/23 7:59 AM Phone: [phone] Fax: [phone]

## 2023-05-15 ENCOUNTER — Ambulatory Visit: Payer: Medicaid Other | Admitting: Cardiovascular Disease

## 2023-05-15 NOTE — Therapy (Unsigned)
OUTPATIENT PHYSICAL THERAPY TREATMENT   Patient Name: [name] MRN: [id number] DOB:[birth year], 62 y.o., male Today's Date: 05/16/2023  END OF SESSION:  PT End of Session - 05/16/23 1106     Visit Number 5    Number of Visits 16    Date for PT Re-Evaluation 05/25/23    Authorization Type [place] [facility] [employer]    Authorization - Number of Visits 27    PT Start Time 1102    PT Stop Time 1145    PT Time Calculation (min) 43 min    Activity Tolerance Patient tolerated treatment well    Behavior During Therapy WFL for tasks assessed/performed                 Past Medical History:  Diagnosis Date   Chest pain    stres test neg x 2, cath 5/12; minimal LAD irregs; no obs CAD; normal LVF   Clotting disorder (HCC)    dvt   GERD (gastroesophageal reflux disease)    History of DVT of lower extremity    on chronic coumadin   Hypertension    Internal hemorrhoids    Cscope 2007   PAD (peripheral artery disease) (HCC)    a. left leg ischemia tx wtih embolectomy of left fem, pop, tib arteries with Dr. Edilia Bo - 08/2006;  b. known occlusion of right pop with collats - med Rx;  c.ABI's 8/09: R 1.0; L 0.91    PFO (patent foramen ovale)    per 08/2006 discharge summary, TEE showed EF 60%, small PFO with minimal right-to-left shunt with Valsalva; not felt to be the source of emboli, lifelong Coumadin recommended   Pneumonia    history of   Polysubstance abuse (HCC)    marijuana only   Renal infarct [facility])    R   Past Surgical History:  Procedure Laterality Date   CHOLECYSTECTOMY     COLONOSCOPY     INGUINAL HERNIA REPAIR Left 07/16/2019   Procedure: LAPAROSCOPIC LEFT INGUINAL HERNIA REPAIR WITH MESH;  Surgeon: Axel Filler, MD;  Location: [facility] OR;  Service: General;  Laterality: Left;   left leg blood clot removal 2009  2009   TONSILLECTOMY     TOTAL HIP ARTHROPLASTY Left 02/03/2023   Procedure: LEFT TOTAL HIP ARTHROPLASTY ANTERIOR APPROACH;  Surgeon: Tarry Kos, MD;   Location: [facility] OR;  Service: Orthopedics;  Laterality: Left;  3-C   WRIST SURGERY Left    Patient Active Problem List   Diagnosis Date Noted   Status post total replacement of left hip 02/03/2023   Primary osteoarthritis of left hip 02/02/2023   Avascular necrosis of bone of left hip (HCC) 12/23/2022   High risk heterosexual behavior 02/05/2021   History of colonic polyps 11/04/2019   PCP NOTES >>>>>>>>>>>>>> 07/26/2016   GERD (gastroesophageal reflux disease) 07/26/2016   Annual physical exam 07/25/2016   HTN (hypertension) 09/16/2014   Nausea without vomiting 11/29/2013   Deep vein thrombosis (HCC) 10/06/2010   Long term current use of anticoagulant 10/06/2010   Renal infarct (HCC) 10/06/2010   Dyslipidemia 09/03/2009   CHEST PAIN-UNSPECIFIED 09/03/2009   SHOULDER PAIN, RIGHT 03/02/2009   PERIPHERAL VASCULAR DISEASE 02/11/2009   SKIN LESION 02/04/2008   Polysubstance abuse (HCC) 05/28/2007   DISORDER, VASCULAR, KIDNEY 05/28/2007   DVT, HX OF 12/25/2006    PCP: Wanda Plump MD   REFERRING PROVIDER: Gershon Mussel  REFERRING DIAG: [phone] (ICD-10-CM) - Status post total replacement of left hip  THERAPY DIAG:  S/P  total left hip arthroplasty  Pain in left hip  Stiffness of left hip, not elsewhere classified  Difficulty in walking, not elsewhere classified  Rationale for Evaluation and Treatment: Rehabilitation  ONSET DATE: 02/03/23  SUBJECTIVE:   SUBJECTIVE STATEMENT:  The hardest thing is getting up out of bed in the AM, stiffness. It does feel like its getting better.    PERTINENT HISTORY: AVN and OA L knee H/o DVT THA 02/03/23   PAIN:  Are you having pain? Yes: NPRS scale: 0-1/10 Pain location: Lt hip Pain description: tired, sore  Aggravating factors: standing, walking  Relieving factors: rest, has some pain meds  With standing long periods, leg gets tired and pain is 4/10  PRECAUTIONS: None  RED FLAGS: None   WEIGHT BEARING RESTRICTIONS: No  FALLS:   Has patient fallen in last 6 months? Yes. Number of falls 3  LIVING ENVIRONMENT: Lives with: lives with their spouse Lives in: House/apartment Stairs: Yes: External: 3-4 steps; can reach bothhas ramp  Has following equipment at home: Quad cane large base and Walker - 2 wheeled  OCCUPATION: [employer]; fork lift stand up   PLOF: Independent with basic ADLs, Independent with household mobility with device, Independent with community mobility with device, Needs assistance with ADLs, Vocation/Vocational requirements: drives forklift , and Leisure: likes to fish, watch TV   PATIENT GOALS: Walk, resume mobilty   NEXT MD VISIT: 6 weeks   OBJECTIVE:   PATIENT SURVEYS:  FOTO 40%  SENSATION: Light touch: Impaired L thigh   EDEMA:  Min per patient   MUSCLE LENGTH: NT   POSTURE: rounded shoulders, forward head, anterior pelvic tilt, left pelvic obliquity, and flexed trunk   PALPATION: Sore to palpation along L thigh, numb proximally along incision   LOWER EXTREMITY ROM:  Active ROM Right eval Left eval  Hip flexion  110  Hip extension    Hip abduction    Hip adduction    Hip internal rotation  15  Hip external rotation  25  Knee flexion  130   Knee extension  0  Ankle dorsiflexion    Ankle plantarflexion    Ankle inversion    Ankle eversion     (Blank rows = not tested)  LOWER EXTREMITY MMT:  MMT Right eval Left eval Left 04/27/2022  Hip flexion 4+ 4- 4-  Hip extension     Hip abduction   3  Hip adduction     Hip internal rotation     Hip external rotation     Knee flexion 5 5   Knee extension 5 4+ 4+  Ankle dorsiflexion 5 5   Ankle plantarflexion     Ankle inversion     Ankle eversion      (Blank rows = not tested)  LOWER EXTREMITY SPECIAL TESTS:  NT  FUNCTIONAL TESTS:  5 times sit to stand: 27sec wth hands, unable to push up without UEs for 1st 3 tries   GAIT: Distance walked: 100 Assistive device utilized: Quad cane large base Level of assistance:  SBA Comments: mild LOB but recovered, turning head makes him unsteady   TODAY'S TREATMENT:       [facility] Adult PT Treatment:                                                DATE: 05/16/23 Therapeutic Exercise: NuStep Hamstring and  ITB strap Hip stretching knee to chest and cross over  Trunk rotation wide knees  Hip ER passive and active  Seated ER stretching  Sit to stand wide knees 15 lbs x 10  Standing wall squats x 10 x 3 sets, added a heel lift to bias each LE Balance in standing with Airex pad: tandem, Narrow, added EC and head turns.  Min LOB when eyes closed.      [facility] Adult PT Treatment:                                                DATE: 05/09/23 Therapeutic Exercise: NuStep L 5 Ue and LE for 6 min  Standing at wall, isometric hold x 30 sec, added opp heel lift and hold 15 sec  Corner for balance on Airex : march, head turns, nods  Sit to stand Airex on mat 2 x 10, used 10 lbs for 2nd set  Seated march blue band  Supine bridge blue band x 10 wide knees  Alt. Blue band knee fall out x 10 Bridge and alt. Knee fall out 3 x 5 reps blue band Clam blue band x 10  Hip abduction x 10  Standing step ups x 10 , 6 inch with min A bilateral UEs  Modalities: MHP 6 min L ant hip.     [facility] Adult PT Treatment:                                                DATE: 05/02/23 Therapeutic Exercise: NuStep L5 UE and LE for 5 min  Sidelying ant hip stretch  Hip abduction lift x 10 , 2 sets 1 set with bent knee (hydrant) Clam x 10 no band then x 10 green band  Bridge 2 x 10 with band  Clam double leg x 15  March hip flex green band x   10 Prop on elbow quad set to SLR x 10  LLE stretch off table with knee flexed for quad and ant hip stretch  Light push into knee flexion  Therapeutic Activity: 2 min walk 297 feet , no increased pain just "worn out" TUG: with cane 17.7 sec, then 13.2 sec 2nd attempt.  Done without cane 12.5 sec    [facility] Adult PT Treatment:                                                 DATE: 04/28/2023 Therapeutic Exercise: NuStep L5 x 5 min with UE/LE while taking subjective Bridge 2 x 10 Hooklying march with green 2 x 5 each Hooklying clamshell with green 2 x 10 Supine active heel slide x 20 SLR partial range 3 x 6 with left Sidelying hip abduction 3 x 6 with left Seated march 3 x 6 with left LAQ with 5# 3 x 10 with left  PATIENT EDUCATION:  Education details: HEP Person educated: Patient and Child(ren) Education method: Programmer, multimedia, Demonstration, Verbal cues, and Handouts Education comprehension: verbalized understanding, returned demonstration, and needs further education  HOME EXERCISE PROGRAM: Access Code: [email or web address] URL: [email or web address] Date: 04/13/2023 Prepared by: Victorino Dike  Temima Kutsch  Exercises - Supine Bridge  - 1 x daily - 7 x weekly - 2 sets - 10 reps - 5 hold - Beginner Side Leg Lift  - 1 x daily - 7 x weekly - 2 sets - 10 reps - 5 hold - Hooklying Straight Leg Raise  - 1 x daily - 7 x weekly - 2 sets - 10 reps - 5 hold - Hooklying Clamshell with Resistance  - 1 x daily - 7 x weekly - 2 sets - 10 reps - 5 hold - Supine Hip Internal and External Rotation  - 1 x daily - 7 x weekly - 2 sets - 10 reps - 10 hold -see medbridge    ASSESSMENT:  CLINICAL IMPRESSION: Patient with significant tightness in L hip, external and internal rotation. Pain level is overall minimal but increased stiffness in AM.  Rt hip stiff as well.  He was shown options for working on this but is apprehensive about pushing this motion in L LE.  Functional strength is improving with sit to stand.    OBJECTIVE IMPAIRMENTS: Abnormal gait, decreased activity tolerance, decreased balance, decreased knowledge of use of DME, decreased mobility, difficulty walking, decreased ROM, decreased strength, increased fascial restrictions, impaired flexibility, postural dysfunction, and pain.   ACTIVITY LIMITATIONS: carrying, lifting, bending, standing, squatting,  sleeping, stairs, transfers, bed mobility, dressing, and locomotion level  PARTICIPATION LIMITATIONS: cleaning, interpersonal relationship, driving, shopping, community activity, occupation, and yard work  PERSONAL FACTORS: Profession and 1 comorbidity: none   are also affecting patient's functional outcome.   REHAB POTENTIAL: Excellent  CLINICAL DECISION MAKING: Stable/uncomplicated  EVALUATION COMPLEXITY: Low   GOALS: Goals reviewed with patient? Yes  SHORT TERM GOALS: Target date: 05/11/2023   Pt will be I with HEP for L hip ROM and strength  Baseline: Goal status: ongoing  2.  Patient will be able to transfer sit to supine without the need for hands to lift leg.  Baseline: L hip weakness, needs A . Update: he can do this sometimes, still a strain Goal status: ongoing   3.  Patient will be able to perform sit to stand without use of hands x 10 Baseline: unable . Update : done off standard mat table with AIREX under hips  Goal status: ongoing  4.  Pt will be able to walk with a cane in the clinic without cues for sequencing Baseline: needs cues  Goal status: MET   5.  Pt will be screened for balance deficits and goal set  Baseline: Unable to do this on evaluation Goal status: MET    LONG TERM GOALS: Target date: 06/08/2023    Patient will be independent with home program at the end of therapy Baseline: Unknown Goal status: INITIAL  2.  Foto score will improve to 66% to demonstrate functional mobility increase Baseline: 40% Goal status: INITIAL  3.  Patient will be able to walk to the grocery store with min  increase in fatigue, pain for 30- 45 min  Baseline: limited by fatigue and pain, 20-30 min  Goal status: INITIAL  4.  Pt will demo 5/5 strength in L hip for optimal gait and mobility  Baseline: 4-/5 Goal status: INITIAL  5.  Patient will report no difficulty with car/bed transfers Baseline: needs A from hands  Goal status: INITIAL  6.  Pt will be able  to complete 2 min walk test without cane, 350 feet  Baseline: 297 with cane  Goal status: ongoing   PLAN:  PT FREQUENCY: 2x/week  PT DURATION: 8 weeks  PLANNED INTERVENTIONS: Therapeutic exercises, Therapeutic activity, Neuromuscular re-education, Balance training, Gait training, Patient/Family education, Self Care, Joint mobilization, Stair training, DME instructions, Spinal mobilization, Cryotherapy, Moist heat, Manual therapy, and Re-evaluation  PLAN FOR NEXT SESSION: gait, endurance, standing: step ups, hip abd and ext strength , add banded hip ex.   Karie Mainland, PT 05/16/23 11:07 AM Phone: [phone] Fax: [phone]

## 2023-05-16 ENCOUNTER — Encounter: Payer: Self-pay | Admitting: Physical Therapy

## 2023-05-16 ENCOUNTER — Ambulatory Visit: Payer: Medicaid Other | Admitting: Physical Therapy

## 2023-05-16 DIAGNOSIS — R262 Difficulty in walking, not elsewhere classified: Secondary | ICD-10-CM

## 2023-05-16 DIAGNOSIS — M25652 Stiffness of left hip, not elsewhere classified: Secondary | ICD-10-CM

## 2023-05-16 DIAGNOSIS — Z96642 Presence of left artificial hip joint: Secondary | ICD-10-CM

## 2023-05-16 DIAGNOSIS — M25552 Pain in left hip: Secondary | ICD-10-CM

## 2023-05-18 ENCOUNTER — Ambulatory Visit: Payer: Medicaid Other | Attending: Cardiovascular Disease | Admitting: Cardiovascular Disease

## 2023-05-18 ENCOUNTER — Encounter: Payer: Self-pay | Admitting: Cardiovascular Disease

## 2023-05-18 ENCOUNTER — Ambulatory Visit: Payer: Medicaid Other | Admitting: Physical Therapy

## 2023-05-18 VITALS — BP 110/86 | HR 90 | Ht 73.0 in | Wt 170.6 lb

## 2023-05-18 DIAGNOSIS — M25552 Pain in left hip: Secondary | ICD-10-CM

## 2023-05-18 DIAGNOSIS — E782 Mixed hyperlipidemia: Secondary | ICD-10-CM

## 2023-05-18 DIAGNOSIS — R079 Chest pain, unspecified: Secondary | ICD-10-CM

## 2023-05-18 DIAGNOSIS — M25652 Stiffness of left hip, not elsewhere classified: Secondary | ICD-10-CM

## 2023-05-18 DIAGNOSIS — R262 Difficulty in walking, not elsewhere classified: Secondary | ICD-10-CM

## 2023-05-18 DIAGNOSIS — Z96642 Presence of left artificial hip joint: Secondary | ICD-10-CM

## 2023-05-18 DIAGNOSIS — I739 Peripheral vascular disease, unspecified: Secondary | ICD-10-CM | POA: Diagnosis not present

## 2023-05-18 DIAGNOSIS — I10 Essential (primary) hypertension: Secondary | ICD-10-CM | POA: Diagnosis not present

## 2023-05-18 NOTE — Patient Instructions (Signed)
Medication Instructions:  Your physician recommends that you continue on your current medications as directed. Please refer to the Current Medication list given to you today.  *If you need a refill on your cardiac medications before your next appointment, please call your pharmacy*   Lab Work: NONE If you have labs (blood work) drawn today and your tests are completely normal, you will receive your results only by: MyChart Message (if you have MyChart) OR A paper copy in the mail If you have any lab test that is abnormal or we need to change your treatment, we will call you to review the results.   Testing/Procedures: NONE   Follow-Up: At [facility], you and your health needs are our priority.  As part of our continuing mission to provide you with exceptional heart care, we have created designated Provider Care Teams.  These Care Teams include your primary Cardiologist (physician) and Advanced Practice Providers (APPs -  Physician Assistants and Nurse Practitioners) who all work together to provide you with the care you need, when you need it.  We recommend signing up for the patient portal called "MyChart".  Sign up information is provided on this After Visit Summary.  MyChart is used to connect with patients for Virtual Visits (Telemedicine).  Patients are able to view lab/test results, encounter notes, upcoming appointments, etc.  Non-urgent messages can be sent to your provider as well.   To learn more about what you can do with MyChart, go to [email or web address].    Your next appointment:   1 year(s)  Provider:    Cooper, MD      

## 2023-05-18 NOTE — Progress Notes (Signed)
Cardiology Office Note:    Date:  05/18/2023   ID:  [name], DOB 11-24-1960, MRN [id number]  PCP:  Wanda Plump, MD   [facility] Providers Cardiologist:  [name], MD     Referring MD: Wanda Plump, MD   Chief Complaint  Patient presents with   Follow-up    PAD    History of Present Illness:    [name] is a 62 y.o. male with a hx of peripheral arterial disease and hypercoagulable disorder. He has a history of left femoral and popliteal artery occlusions in 2008 requiring embolectomy. He also experienced a renal infarction at that time. He underwent cardiac catheterization in 2012 demonstrating no evidence of coronary artery disease.   The patient is here with his wife today.  He has been doing fine from a cardiac standpoint and denies chest pain, chest pressure, or shortness of breath.  He has some chronic leg pain that is unchanged over time.  No orthopnea, PND, or heart palpitations.  He continues to drink alcohol and smoke cigarettes.   Current Medications: Current Meds  Medication Sig   amLODipine (NORVASC) 5 MG tablet Take 1 tablet (5 mg total) by mouth daily.   apixaban (ELIQUIS) 5 MG TABS tablet Take 1 tablet by mouth twice daily   atorvastatin (LIPITOR) 40 MG tablet Take 1 tablet (40 mg total) by mouth daily.     Allergies:   Pravastatin, Simvastatin, and Lisinopril   ROS:   Please see the history of present illness.    All other systems reviewed and are negative.  EKGs/Labs/Other Studies Reviewed:    The following studies were reviewed today: Cardiac Studies & Procedures     STRESS TESTS  MYOCARDIAL PERFUSION IMAGING 05/30/2022  Narrative   The study is normal. The study is low risk.   No ST deviation was noted.   LV perfusion is normal. There is no evidence of ischemia. There is no evidence of infarction.   Left ventricular function is normal. Nuclear stress EF: 63 %. The left ventricular ejection fraction is normal  (55-65%). End diastolic cavity size is normal. End systolic cavity size is normal.   Prior study available for comparison from 01/15/2014.  Normal resting and stress perfusion. No ischemia or infarction EF 63%              EKG:        Recent Labs: 01/26/2023: ALT 29; BUN 14; Creatinine, Ser 0.75; Potassium 3.1; Sodium 137 02/04/2023: Hemoglobin 12.5; Platelets 260  Recent Lipid Panel    Component Value Date/Time   CHOL 190 08/30/2022 1006   TRIG 75 08/30/2022 1006   HDL 83 08/30/2022 1006   CHOLHDL 2.3 08/30/2022 1006   CHOLHDL 5 07/25/2016 1539   VLDL 35.8 07/25/2016 1539   LDLCALC 93 08/30/2022 1006   LDLDIRECT 143.7 11/24/2008 1235     Risk Assessment/Calculations:                Physical Exam:    VS:  BP 110/86   Pulse 90   Ht 6\' 1"  (1.854 m)   Wt 170 lb 9.6 oz (77.4 kg)   SpO2 97%   BMI 22.51 kg/m     Wt Readings from Last 3 Encounters:  05/18/23 170 lb 9.6 oz (77.4 kg)  04/03/23 175 lb 8 oz (79.6 kg)  02/03/23 170 lb (77.1 kg)     GEN:  Well nourished, well developed in no acute distress HEENT: Normal  NECK: No JVD; No carotid bruits LYMPHATICS: No lymphadenopathy CARDIAC: RRR, no murmurs, rubs, gallops RESPIRATORY:  Clear to auscultation without rales, wheezing or rhonchi  ABDOMEN: Soft, non-tender, non-distended MUSCULOSKELETAL:  No edema; No deformity  SKIN: Warm and dry NEUROLOGIC:  Alert and oriented x 3 PSYCHIATRIC:  Normal affect   Assessment & Plan Chest pain, unspecified type Patient with history of atypical chest pain, last evaluated with stress testing in 2023 that showed no evidence of ischemia and normal LVEF.  No recent symptoms noted.  Continue medical management.  No indication for further evaluation at this time. Mixed hyperlipidemia Treated with atorvastatin.  Last LDL cholesterol was 93.  Goal below 70, work on lifestyle modification.  Patient not interested in taking further medication. Essential hypertension Blood pressure  controlled on amlodipine PAD (peripheral artery disease) (HCC) Treated with anticoagulation and a statin drug.  Continues to smoke and not ready to quit.  Stable claudication symptoms noted.            Medication Adjustments/Labs and Tests Ordered: Current medicines are reviewed at length with the patient today.  Concerns regarding medicines are outlined above.  Orders Placed This Encounter  Procedures   EKG 12-Lead   No orders of the defined types were placed in this encounter.   There are no Patient Instructions on file for this visit.   Signed, [name], MD  05/18/2023 9:29 AM    [facility]

## 2023-05-18 NOTE — Therapy (Signed)
OUTPATIENT PHYSICAL THERAPY TREATMENT   Patient Name: [name] MRN: [id number] DOB:1960-10-24, 62 y.o., male Today's Date: 05/18/2023  END OF SESSION:  PT End of Session - 05/18/23 1104     Visit Number 6    Number of Visits 16    Date for PT Re-Evaluation 05/25/23    Authorization Type [place] [facility] [employer]    PT Start Time 1055    PT Stop Time 1140    PT Time Calculation (min) 45 min    Activity Tolerance Patient tolerated treatment well    Behavior During Therapy WFL for tasks assessed/performed                 Past Medical History:  Diagnosis Date   Chest pain    stres test neg x 2, cath 5/12; minimal LAD irregs; no obs CAD; normal LVF   Clotting disorder (HCC)    dvt   GERD (gastroesophageal reflux disease)    History of DVT of lower extremity    on chronic coumadin   Hypertension    Internal hemorrhoids    Cscope 2007   PAD (peripheral artery disease) (HCC)    a. left leg ischemia tx wtih embolectomy of left fem, pop, tib arteries with Dr. Edilia Bo - 08/2006;  b. known occlusion of right pop with collats - med Rx;  c.ABI's 8/09: R 1.0; L 0.91    PFO (patent foramen ovale)    per 08/2006 discharge summary, TEE showed EF 60%, small PFO with minimal right-to-left shunt with Valsalva; not felt to be the source of emboli, lifelong Coumadin recommended   Pneumonia    history of   Polysubstance abuse (HCC)    marijuana only   Renal infarct (HCC)    R   Past Surgical History:  Procedure Laterality Date   CHOLECYSTECTOMY     COLONOSCOPY     INGUINAL HERNIA REPAIR Left 07/16/2019   Procedure: LAPAROSCOPIC LEFT INGUINAL HERNIA REPAIR WITH MESH;  Surgeon: Axel Filler, MD;  Location: [facility] OR;  Service: General;  Laterality: Left;   left leg blood clot removal 2009  2009   TONSILLECTOMY     TOTAL HIP ARTHROPLASTY Left 02/03/2023   Procedure: LEFT TOTAL HIP ARTHROPLASTY ANTERIOR APPROACH;  Surgeon: Tarry Kos, MD;  Location: [facility] OR;  Service: Orthopedics;   Laterality: Left;  3-C   WRIST SURGERY Left    Patient Active Problem List   Diagnosis Date Noted   Status post total replacement of left hip 02/03/2023   Primary osteoarthritis of left hip 02/02/2023   Avascular necrosis of bone of left hip (HCC) 12/23/2022   High risk heterosexual behavior 02/05/2021   History of colonic polyps 11/04/2019   PCP NOTES >>>>>>>>>>>>>> 07/26/2016   GERD (gastroesophageal reflux disease) 07/26/2016   Annual physical exam 07/25/2016   HTN (hypertension) 09/16/2014   Nausea without vomiting 11/29/2013   Deep vein thrombosis (HCC) 10/06/2010   Long term current use of anticoagulant 10/06/2010   Renal infarct (HCC) 10/06/2010   Dyslipidemia 09/03/2009   CHEST PAIN-UNSPECIFIED 09/03/2009   SHOULDER PAIN, RIGHT 03/02/2009   PERIPHERAL VASCULAR DISEASE 02/11/2009   SKIN LESION 02/04/2008   Polysubstance abuse (HCC) 05/28/2007   DISORDER, VASCULAR, KIDNEY 05/28/2007   DVT, HX OF 12/25/2006    PCP: Wanda Plump MD   REFERRING PROVIDER: Gershon Mussel  REFERRING DIAG: [phone] (ICD-10-CM) - Status post total replacement of left hip  THERAPY DIAG:  S/P total left hip arthroplasty  Pain in left hip  Stiffness of left hip, not elsewhere classified  Difficulty in walking, not elsewhere classified  Rationale for Evaluation and Treatment: Rehabilitation  ONSET DATE: 02/03/23  SUBJECTIVE:   SUBJECTIVE STATEMENT:  I am so sore! Pt reports increased soreness in quads (did wall squats the other day).  Saw his cardiologist this AM. All clear.   PERTINENT HISTORY: AVN and OA L knee H/o DVT THA 02/03/23   PAIN:  Are you having pain? Yes: NPRS scale: 2/10 Pain location: Lt hip Pain description: tired, sore  Aggravating factors: standing, walking  Relieving factors: rest, has some pain meds  With standing long periods, leg gets tired and pain is 4/10  PRECAUTIONS: None  RED FLAGS: None   WEIGHT BEARING RESTRICTIONS: No  FALLS:  Has patient  fallen in last 6 months? Yes. Number of falls 3  LIVING ENVIRONMENT: Lives with: lives with their spouse Lives in: House/apartment Stairs: Yes: External: 3-4 steps; can reach bothhas ramp  Has following equipment at home: Quad cane large base and Walker - 2 wheeled  OCCUPATION: [employer]; fork lift stand up   PLOF: Independent with basic ADLs, Independent with household mobility with device, Independent with community mobility with device, Needs assistance with ADLs, Vocation/Vocational requirements: drives forklift , and Leisure: likes to fish, watch TV   PATIENT GOALS: Walk, resume mobilty   NEXT MD VISIT: 6 weeks   OBJECTIVE:   PATIENT SURVEYS:  FOTO 40%  SENSATION: Light touch: Impaired L thigh   EDEMA:  Min per patient   MUSCLE LENGTH: NT   POSTURE: rounded shoulders, forward head, anterior pelvic tilt, left pelvic obliquity, and flexed trunk   PALPATION: Sore to palpation along L thigh, numb proximally along incision   LOWER EXTREMITY ROM:  Active ROM Right eval Left eval Lt.  05/18/23   Hip flexion  110 110  Hip extension     Hip abduction     Hip adduction     Hip internal rotation  15 20  Hip external rotation  25 30  Knee flexion  130    Knee extension  0   Ankle dorsiflexion     Ankle plantarflexion     Ankle inversion     Ankle eversion      (Blank rows = not tested)  LOWER EXTREMITY MMT:  MMT Right eval Left eval Left 04/27/2022  Hip flexion 4+ 4- 4-  Hip extension     Hip abduction   3  Hip adduction     Hip internal rotation     Hip external rotation     Knee flexion 5 5   Knee extension 5 4+ 4+  Ankle dorsiflexion 5 5   Ankle plantarflexion     Ankle inversion     Ankle eversion      (Blank rows = not tested)  LOWER EXTREMITY SPECIAL TESTS:  NT  FUNCTIONAL TESTS:  5 times sit to stand: 27sec wth hands, unable to push up without UEs for 1st 3 tries   GAIT: Distance walked: 100 Assistive device utilized: Quad cane large  base Level of assistance: SBA Comments: mild LOB but recovered, turning head makes him unsteady   TODAY'S TREATMENT:        [facility] Adult PT Treatment:  DATE: 05/18/23 Therapeutic Exercise: Nustep Supine knee to chest  Lower trunk rotation with knees apart IR, ER L hip AROM hooklying ER and IR Supine green band single leg ER x 10  Banded bridge x 10 green Sidelying IR and ER no band , used ball between knees Sidelying stretch, leg extended back (hip flexor)  Sidelying IR  (LLE on bottom)  Seated IR ball between knees no wgt x 15 Seated knee ext with ball 4 lbs cuff wgt x 15  Standing hip flexor 20 sec x 3 each side   [facility] Adult PT Treatment:                                                DATE: 05/16/23 Therapeutic Exercise: NuStep Hamstring and ITB strap Hip stretching knee to chest and cross over   Trunk rotation wide knees  Hip ER passive and active  Seated ER stretching  Sit to stand wide knees 15 lbs x 10  Standing wall squats x 10 x 3 sets, added a heel lift to bias each LE Balance in standing with Airex pad: tandem, Narrow, added EC and head turns.  Min LOB when eyes closed.      [facility] Adult PT Treatment:                                                DATE: 05/09/23 Therapeutic Exercise: NuStep L 5 Ue and LE for 6 min  Standing at wall, isometric hold x 30 sec, added opp heel lift and hold 15 sec  Corner for balance on Airex : march, head turns, nods  Sit to stand Airex on mat 2 x 10, used 10 lbs for 2nd set  Seated march blue band  Supine bridge blue band x 10 wide knees  Alt. Blue band knee fall out x 10 Bridge and alt. Knee fall out 3 x 5 reps blue band Clam blue band x 10  Hip abduction x 10  Standing step ups x 10 , 6 inch with min A bilateral UEs  Modalities: MHP 6 min L ant hip.     [facility] Adult PT Treatment:                                                DATE: 05/02/23 Therapeutic Exercise: NuStep L5 UE and LE  for 5 min  Sidelying ant hip stretch  Hip abduction lift x 10 , 2 sets 1 set with bent knee (hydrant) Clam x 10 no band then x 10 green band  Bridge 2 x 10 with band  Clam double leg x 15  March hip flex green band x   10 Prop on elbow quad set to SLR x 10  LLE stretch off table with knee flexed for quad and ant hip stretch  Light push into knee flexion  Therapeutic Activity: 2 min walk 297 feet , no increased pain just "worn out" TUG: with cane 17.7 sec, then 13.2 sec 2nd attempt.  Done without cane 12.5 sec    [facility] Adult PT Treatment:  DATE: 04/28/2023 Therapeutic Exercise: NuStep L5 x 5 min with UE/LE while taking subjective Bridge 2 x 10 Hooklying march with green 2 x 5 each Hooklying clamshell with green 2 x 10 Supine active heel slide x 20 SLR partial range 3 x 6 with left Sidelying hip abduction 3 x 6 with left Seated march 3 x 6 with left LAQ with 5# 3 x 10 with left  PATIENT EDUCATION:  Education details: HEP Person educated: Patient and Child(ren) Education method: Programmer, multimedia, Demonstration, Verbal cues, and Handouts Education comprehension: verbalized understanding, returned demonstration, and needs further education  HOME EXERCISE PROGRAM: Access Code: [email or web address] URL: [email or web address] Date: 04/13/2023 Prepared by: Karie Mainland  Exercises - Supine Bridge  - 1 x daily - 7 x weekly - 2 sets - 10 reps - 5 hold - Beginner Side Leg Lift  - 1 x daily - 7 x weekly - 2 sets - 10 reps - 5 hold - Hooklying Straight Leg Raise  - 1 x daily - 7 x weekly - 2 sets - 10 reps - 5 hold - Hooklying Clamshell with Resistance  - 1 x daily - 7 x weekly - 2 sets - 10 reps - 5 hold - Supine Hip Internal and External Rotation  - 1 x daily - 7 x weekly - 2 sets - 10 reps - 10 hold -see medbridge    ASSESSMENT:  CLINICAL IMPRESSION: Patient with significant tightness in L hip, external and internal rotation. Pain level  is overall minimal but increased stiffness in AM.  Rt hip stiff as well.  He was shown options for working on this but is apprehensive about pushing this motion in L LE.  Functional strength is improving with sit to stand.    OBJECTIVE IMPAIRMENTS: Abnormal gait, decreased activity tolerance, decreased balance, decreased knowledge of use of DME, decreased mobility, difficulty walking, decreased ROM, decreased strength, increased fascial restrictions, impaired flexibility, postural dysfunction, and pain.   ACTIVITY LIMITATIONS: carrying, lifting, bending, standing, squatting, sleeping, stairs, transfers, bed mobility, dressing, and locomotion level  PARTICIPATION LIMITATIONS: cleaning, interpersonal relationship, driving, shopping, community activity, occupation, and yard work  PERSONAL FACTORS: Profession and 1 comorbidity: none   are also affecting patient's functional outcome.   REHAB POTENTIAL: Excellent  CLINICAL DECISION MAKING: Stable/uncomplicated  EVALUATION COMPLEXITY: Low   GOALS: Goals reviewed with patient? Yes  SHORT TERM GOALS: Target date: 05/11/2023   Pt will be I with HEP for L hip ROM and strength  Baseline: Goal status: ongoing  2.  Patient will be able to transfer sit to supine without the need for hands to lift leg.  Baseline: L hip weakness, needs A . Update: he can do this sometimes, still a strain Goal status: ongoing   3.  Patient will be able to perform sit to stand without use of hands x 10 Baseline: unable . Update : done off standard mat table with AIREX under hips  Goal status: ongoing  4.  Pt will be able to walk with a cane in the clinic without cues for sequencing Baseline: needs cues  Goal status: MET   5.  Pt will be screened for balance deficits and goal set  Baseline: Unable to do this on evaluation Goal status: MET    LONG TERM GOALS: Target date: 06/08/2023    Patient will be independent with home program at the end of  therapy Baseline: Unknown Goal status: INITIAL  2.  Foto score will improve to 66% to demonstrate functional mobility increase  Baseline: 40% Goal status: INITIAL  3.  Patient will be able to walk to the grocery store with min  increase in fatigue, pain for 30- 45 min  Baseline: limited by fatigue and pain, 20-30 min  Goal status: INITIAL  4.  Pt will demo 5/5 strength in L hip for optimal gait and mobility  Baseline: 4-/5 Goal status: INITIAL  5.  Patient will report no difficulty with car/bed transfers Baseline: needs A from hands  Goal status: INITIAL  6.  Pt will be able to complete 2 min walk test without cane, 350 feet  Baseline: 297 with cane  Goal status: ongoing   PLAN:   PT FREQUENCY: 2x/week  PT DURATION: 8 weeks  PLANNED INTERVENTIONS: Therapeutic exercises, Therapeutic activity, Neuromuscular re-education, Balance training, Gait training, Patient/Family education, Self Care, Joint mobilization, Stair training, DME instructions, Spinal mobilization, Cryotherapy, Moist heat, Manual therapy, and Re-evaluation  PLAN FOR NEXT SESSION: gait, endurance, standing: step ups, hip abd and ext strength , add banded hip ex.   Karie Mainland, PT 05/18/23 12:05 PM Phone: [phone] Fax: [phone]

## 2023-05-19 NOTE — Assessment & Plan Note (Signed)
Patient with history of atypical chest pain, last evaluated with stress testing in 2023 that showed no evidence of ischemia and normal LVEF.  No recent symptoms noted.  Continue medical management.  No indication for further evaluation at this time.

## 2023-05-23 ENCOUNTER — Ambulatory Visit: Payer: Medicaid Other | Admitting: Physical Therapy

## 2023-05-23 ENCOUNTER — Other Ambulatory Visit: Payer: Self-pay | Admitting: Cardiovascular Disease

## 2023-05-23 DIAGNOSIS — Z96642 Presence of left artificial hip joint: Secondary | ICD-10-CM | POA: Diagnosis not present

## 2023-05-23 DIAGNOSIS — R262 Difficulty in walking, not elsewhere classified: Secondary | ICD-10-CM

## 2023-05-23 DIAGNOSIS — M25552 Pain in left hip: Secondary | ICD-10-CM

## 2023-05-23 DIAGNOSIS — M25652 Stiffness of left hip, not elsewhere classified: Secondary | ICD-10-CM

## 2023-05-23 NOTE — Therapy (Signed)
OUTPATIENT PHYSICAL THERAPY TREATMENT   Patient Name: [name] MRN: [id number] DOB:1961/06/13, 62 y.o., male Today's Date: 05/23/2023  END OF SESSION:  PT End of Session - 05/23/23 1113     Visit Number 7    Number of Visits 16    Date for PT Re-Evaluation 05/25/23    Authorization Type [place] [facility]    Authorization - Number of Visits 27    PT Start Time 1104    PT Stop Time 1145    PT Time Calculation (min) 41 min    Activity Tolerance Patient tolerated treatment well    Behavior During Therapy WFL for tasks assessed/performed                  Past Medical History:  Diagnosis Date   Chest pain    stres test neg x 2, cath 5/12; minimal LAD irregs; no obs CAD; normal LVF   Clotting disorder (HCC)    dvt   GERD (gastroesophageal reflux disease)    History of DVT of lower extremity    on chronic coumadin   Hypertension    Internal hemorrhoids    Cscope 2007   PAD (peripheral artery disease) (HCC)    a. left leg ischemia tx wtih embolectomy of left fem, pop, tib arteries with Dr. Edilia Bo - 08/2006;  b. known occlusion of right pop with collats - med Rx;  c.ABI's 8/09: R 1.0; L 0.91    PFO (patent foramen ovale)    per 08/2006 discharge summary, TEE showed EF 60%, small PFO with minimal right-to-left shunt with Valsalva; not felt to be the source of emboli, lifelong Coumadin recommended   Pneumonia    history of   Polysubstance abuse (HCC)    marijuana only   Renal infarct [facility])    R   Past Surgical History:  Procedure Laterality Date   CHOLECYSTECTOMY     COLONOSCOPY     INGUINAL HERNIA REPAIR Left 07/16/2019   Procedure: LAPAROSCOPIC LEFT INGUINAL HERNIA REPAIR WITH MESH;  Surgeon: Axel Filler, MD;  Location: [facility] OR;  Service: General;  Laterality: Left;   left leg blood clot removal 2009  2009   TONSILLECTOMY     TOTAL HIP ARTHROPLASTY Left 02/03/2023   Procedure: LEFT TOTAL HIP ARTHROPLASTY ANTERIOR APPROACH;  Surgeon: Tarry Kos, MD;   Location: [facility] OR;  Service: Orthopedics;  Laterality: Left;  3-C   WRIST SURGERY Left    Patient Active Problem List   Diagnosis Date Noted   Status post total replacement of left hip 02/03/2023   Primary osteoarthritis of left hip 02/02/2023   Avascular necrosis of bone of left hip (HCC) 12/23/2022   High risk heterosexual behavior 02/05/2021   History of colonic polyps 11/04/2019   PCP NOTES >>>>>>>>>>>>>> 07/26/2016   GERD (gastroesophageal reflux disease) 07/26/2016   Annual physical exam 07/25/2016   HTN (hypertension) 09/16/2014   Nausea without vomiting 11/29/2013   Deep vein thrombosis (HCC) 10/06/2010   Long term current use of anticoagulant 10/06/2010   Renal infarct (HCC) 10/06/2010   Dyslipidemia 09/03/2009   CHEST PAIN-UNSPECIFIED 09/03/2009   SHOULDER PAIN, RIGHT 03/02/2009   PERIPHERAL VASCULAR DISEASE 02/11/2009   SKIN LESION 02/04/2008   Polysubstance abuse (HCC) 05/28/2007   DISORDER, VASCULAR, KIDNEY 05/28/2007   DVT, HX OF 12/25/2006    PCP: Wanda Plump MD   REFERRING PROVIDER: Gershon Mussel  REFERRING DIAG: [phone] (ICD-10-CM) - Status post total replacement of left hip  THERAPY DIAG:  S/P total left hip arthroplasty  Pain in left hip  Stiffness of left hip, not elsewhere classified  Difficulty in walking, not elsewhere classified  Rationale for Evaluation and Treatment: Rehabilitation  ONSET DATE: 02/03/23  SUBJECTIVE:   SUBJECTIVE STATEMENT:  I want to walk without this cane .   PERTINENT HISTORY: AVN and OA L knee H/o DVT THA 02/03/23   PAIN:  Are you having pain? Yes: NPRS scale: 2/10 Pain location: Lt hip Pain description: tired, sore  Aggravating factors: standing, walking  Relieving factors: rest, has some pain meds  With standing long periods, leg gets tired and pain is 4/10  PRECAUTIONS: None  RED FLAGS: None   WEIGHT BEARING RESTRICTIONS: No  FALLS:  Has patient fallen in last 6 months? Yes. Number of falls  3  LIVING ENVIRONMENT: Lives with: lives with their spouse Lives in: House/apartment Stairs: Yes: External: 3-4 steps; can reach bothhas ramp  Has following equipment at home: Quad cane large base and Walker - 2 wheeled  OCCUPATION: [employer]; fork lift stand up   PLOF: Independent with basic ADLs, Independent with household mobility with device, Independent with community mobility with device, Needs assistance with ADLs, Vocation/Vocational requirements: drives forklift , and Leisure: likes to fish, watch TV   PATIENT GOALS: Walk, resume mobilty   NEXT MD VISIT: 6 weeks   OBJECTIVE:   PATIENT SURVEYS:  FOTO 40%  SENSATION: Light touch: Impaired L thigh   EDEMA:  Min per patient   MUSCLE LENGTH: NT   POSTURE: rounded shoulders, forward head, anterior pelvic tilt, left pelvic obliquity, and flexed trunk   PALPATION: Sore to palpation along L thigh, numb proximally along incision   LOWER EXTREMITY ROM:  Active ROM Right eval Left eval Lt.  05/18/23   Hip flexion  110 110  Hip extension     Hip abduction     Hip adduction     Hip internal rotation  15 20  Hip external rotation  25 30  Knee flexion  130    Knee extension  0   Ankle dorsiflexion     Ankle plantarflexion     Ankle inversion     Ankle eversion      (Blank rows = not tested)  LOWER EXTREMITY MMT:  MMT Right eval Left eval Left 04/27/2022  Hip flexion 4+ 4- 4-  Hip extension     Hip abduction   3  Hip adduction     Hip internal rotation     Hip external rotation     Knee flexion 5 5   Knee extension 5 4+ 4+  Ankle dorsiflexion 5 5   Ankle plantarflexion     Ankle inversion     Ankle eversion      (Blank rows = not tested)  LOWER EXTREMITY SPECIAL TESTS:  NT  FUNCTIONAL TESTS:  5 times sit to stand: 27sec wth hands, unable to push up without UEs for 1st 3 tries   GAIT: Distance walked: 100 Assistive device utilized: Quad cane large base Level of assistance: SBA Comments: mild LOB  but recovered, turning head makes him unsteady   TODAY'S TREATMENT:       [facility] Adult PT Treatment:                                                DATE: 05/23/23 Therapeutic  Exercise: Nustep L 6 UE and LE  Step ups to hip abduction   Green band x hip extension x 10 each side  Banded squats x 15  Sidestepping green band  x 8 x 10 feet Hip flexion alternating 3 lbs  2 min walk no cane 334 feet hip tired 2 min walk with cane 330 feet  Hamstring stretch x 3, 30 sec  Knee to chest added some circles  Piriformis (ER) stretch with assist , L foot over Rt thigh   [facility] Adult PT Treatment:                                                DATE: 05/18/23 Therapeutic Exercise: Nustep Supine knee to chest  Lower trunk rotation with knees apart IR, ER L hip AROM hooklying ER and IR Supine green band single leg ER x 10  Banded bridge x 10 green Sidelying IR and ER no band , used ball between knees Sidelying stretch, leg extended back (hip flexor)  Sidelying IR  (LLE on bottom)  Seated IR ball between knees no wgt x 15 Seated knee ext with ball 4 lbs cuff wgt x 15  Standing hip flexor 20 sec x 3 each side   [facility] Adult PT Treatment:                                                DATE: 05/16/23 Therapeutic Exercise: NuStep Hamstring and ITB strap Hip stretching knee to chest and cross over   Trunk rotation wide knees  Hip ER passive and active  Seated ER stretching  Sit to stand wide knees 15 lbs x 10  Standing wall squats x 10 x 3 sets, added a heel lift to bias each LE Balance in standing with Airex pad: tandem, Narrow, added EC and head turns.  Min LOB when eyes closed.      [facility] Adult PT Treatment:                                                DATE: 05/09/23 Therapeutic Exercise: NuStep L 5 Ue and LE for 6 min  Standing at wall, isometric hold x 30 sec, added opp heel lift and hold 15 sec  Corner for balance on Airex : march, head turns, nods  Sit to stand Airex on mat 2 x 10, used  10 lbs for 2nd set  Seated march blue band  Supine bridge blue band x 10 wide knees  Alt. Blue band knee fall out x 10 Bridge and alt. Knee fall out 3 x 5 reps blue band Clam blue band x 10  Hip abduction x 10  Standing step ups x 10 , 6 inch with min A bilateral UEs  Modalities: MHP 6 min L ant hip.     [facility] Adult PT Treatment:  DATE: 05/02/23 Therapeutic Exercise: NuStep L5 UE and LE for 5 min  Sidelying ant hip stretch  Hip abduction lift x 10 , 2 sets 1 set with bent knee (hydrant) Clam x 10 no band then x 10 green band  Bridge 2 x 10 with band  Clam double leg x 15  March hip flex green band x   10 Prop on elbow quad set to SLR x 10  LLE stretch off table with knee flexed for quad and ant hip stretch  Light push into knee flexion  Therapeutic Activity: 2 min walk 297 feet , no increased pain just "worn out" TUG: with cane 17.7 sec, then 13.2 sec 2nd attempt.  Done without cane 12.5 sec    PATIENT EDUCATION:  Education details: HEP Person educated: Patient and Child(ren) Education method: Explanation, Demonstration, Verbal cues, and Handouts Education comprehension: verbalized understanding, returned demonstration, and needs further education  HOME EXERCISE PROGRAM: Access Code: [email or web address] URL: [email or web address] Date: 04/13/2023 Prepared by: Karie Mainland  Exercises - Supine Bridge  - 1 x daily - 7 x weekly - 2 sets - 10 reps - 5 hold - Beginner Side Leg Lift  - 1 x daily - 7 x weekly - 2 sets - 10 reps - 5 hold - Hooklying Straight Leg Raise  - 1 x daily - 7 x weekly - 2 sets - 10 reps - 5 hold - Hooklying Clamshell with Resistance  - 1 x daily - 7 x weekly - 2 sets - 10 reps - 5 hold - Supine Hip Internal and External Rotation  - 1 x daily - 7 x weekly - 2 sets - 10 reps - 10 hold -see medbridge    ASSESSMENT:  CLINICAL IMPRESSION: Patient is trying to push walking without assistive device  especially in his home.  He wants to be able to walk his dog, rec he use his cane and have his wife with him to walk him as a trial.  Renew next visits for more time. He continues to have significant limp with gait but was actually able to walk a bit further without it.    OBJECTIVE IMPAIRMENTS: Abnormal gait, decreased activity tolerance, decreased balance, decreased knowledge of use of DME, decreased mobility, difficulty walking, decreased ROM, decreased strength, increased fascial restrictions, impaired flexibility, postural dysfunction, and pain.   ACTIVITY LIMITATIONS: carrying, lifting, bending, standing, squatting, sleeping, stairs, transfers, bed mobility, dressing, and locomotion level  PARTICIPATION LIMITATIONS: cleaning, interpersonal relationship, driving, shopping, community activity, occupation, and yard work  PERSONAL FACTORS: Profession and 1 comorbidity: none   are also affecting patient's functional outcome.   REHAB POTENTIAL: Excellent  CLINICAL DECISION MAKING: Stable/uncomplicated  EVALUATION COMPLEXITY: Low   GOALS: Goals reviewed with patient? Yes  SHORT TERM GOALS: Target date: 05/11/2023   Pt will be I with HEP for L hip ROM and strength  Baseline: Goal status: ongoing  2.  Patient will be able to transfer sit to supine without the need for hands to lift leg.  Baseline: L hip weakness, needs A . Update: he can do this sometimes, still a strain Goal status: ongoing   3.  Patient will be able to perform sit to stand without use of hands x 10 Baseline: unable . Update : done off standard mat table with AIREX under hips  Goal status: ongoing  4.  Pt will be able to walk with a cane in the clinic without cues for sequencing Baseline: needs cues  Goal status: MET  5.  Pt will be screened for balance deficits and goal set  Baseline: Unable to do this on evaluation Goal status: MET    LONG TERM GOALS: Target date: 06/08/2023    Patient will be  independent with home program at the end of therapy Baseline: Unknown Goal status: INITIAL  2.  Foto score will improve to 66% to demonstrate functional mobility increase Baseline: 40% Goal status: INITIAL  3.  Patient will be able to walk to the grocery store with min  increase in fatigue, pain for 30- 45 min  Baseline: limited by fatigue and pain, 20-30 min  Goal status: INITIAL  4.  Pt will demo 5/5 strength in L hip for optimal gait and mobility  Baseline: 4-/5 Goal status: INITIAL  5.  Patient will report no difficulty with car/bed transfers Baseline: needs A from hands  Goal status: INITIAL  6.  Pt will be able to complete 2 min walk test without cane, 350 feet  Baseline: 297 with cane  Goal status: ongoing   PLAN:   PT FREQUENCY: 2x/week  PT DURATION: 8 weeks  PLANNED INTERVENTIONS: Therapeutic exercises, Therapeutic activity, Neuromuscular re-education, Balance training, Gait training, Patient/Family education, Self Care, Joint mobilization, Stair training, DME instructions, Spinal mobilization, Cryotherapy, Moist heat, Manual therapy, and Re-evaluation  PLAN FOR NEXT SESSION: FOTO, renew ,gait, endurance, standing: step ups, hip abd and ext strength , add banded hip ex.   Karie Mainland, PT 05/23/23 1:03 PM Phone: [phone] Fax: [phone]

## 2023-05-25 ENCOUNTER — Ambulatory Visit: Payer: Medicaid Other | Admitting: Physical Therapy

## 2023-05-25 ENCOUNTER — Encounter: Payer: Self-pay | Admitting: Physical Therapy

## 2023-05-25 DIAGNOSIS — R262 Difficulty in walking, not elsewhere classified: Secondary | ICD-10-CM

## 2023-05-25 DIAGNOSIS — Z96642 Presence of left artificial hip joint: Secondary | ICD-10-CM | POA: Diagnosis not present

## 2023-05-25 DIAGNOSIS — M25652 Stiffness of left hip, not elsewhere classified: Secondary | ICD-10-CM

## 2023-05-25 DIAGNOSIS — M25552 Pain in left hip: Secondary | ICD-10-CM

## 2023-05-25 NOTE — Therapy (Signed)
OUTPATIENT PHYSICAL THERAPY TREATMENT THERAPY NOTE    Patient Name: [name] MRN: [id number] DOB:12-13-1960, 62 y.o., male Today's Date: 05/25/2023  END OF SESSION:  PT End of Session - 05/25/23 1052     Visit Number 8    Number of Visits 16    Date for PT Re-Evaluation 07/06/23    Authorization Type [place] [employer]    PT Start Time 1055    PT Stop Time 1140    PT Time Calculation (min) 45 min    Activity Tolerance Patient tolerated treatment well    Behavior During Therapy WFL for tasks assessed/performed                   Past Medical History:  Diagnosis Date   Chest pain    stres test neg x 2, cath 5/12; minimal LAD irregs; no obs CAD; normal LVF   Clotting disorder (HCC)    dvt   GERD (gastroesophageal reflux disease)    History of DVT of lower extremity    on chronic coumadin   Hypertension    Internal hemorrhoids    Cscope 2007   PAD (peripheral artery disease) (HCC)    a. left leg ischemia tx wtih embolectomy of left fem, pop, tib arteries with Dr. Edilia Bo - 08/2006;  b. known occlusion of right pop with collats - med Rx;  c.ABI's 8/09: R 1.0; L 0.91    PFO (patent foramen ovale)    per 08/2006 discharge summary, TEE showed EF 60%, small PFO with minimal right-to-left shunt with Valsalva; not felt to be the source of emboli, lifelong Coumadin recommended   Pneumonia    history of   Polysubstance abuse (HCC)    marijuana only   Renal infarct [facility])    R   Past Surgical History:  Procedure Laterality Date   CHOLECYSTECTOMY     COLONOSCOPY     INGUINAL HERNIA REPAIR Left 07/16/2019   Procedure: LAPAROSCOPIC LEFT INGUINAL HERNIA REPAIR WITH MESH;  Surgeon: Axel Filler, MD;  Location[facility] OR;  Service: General;  Laterality: Left;   left leg blood clot removal 2009  2009   TONSILLECTOMY     TOTAL HIP ARTHROPLASTY Left 02/03/2023   Procedure: LEFT TOTAL HIP ARTHROPLASTY ANTERIOR APPROACH;  Surgeon: Tarry Kos, MD;  Location: [facility] OR;  Service:  Orthopedics;  Laterality: Left;  3-C   WRIST SURGERY Left    Patient Active Problem List   Diagnosis Date Noted   Status post total replacement of left hip 02/03/2023   Primary osteoarthritis of left hip 02/02/2023   Avascular necrosis of bone of left hip (HCC) 12/23/2022   High risk heterosexual behavior 02/05/2021   History of colonic polyps 11/04/2019   PCP NOTES >>>>>>>>>>>>>> 07/26/2016   GERD (gastroesophageal reflux disease) 07/26/2016   Annual physical exam 07/25/2016   HTN (hypertension) 09/16/2014   Nausea without vomiting 11/29/2013   Deep vein thrombosis (HCC) 10/06/2010   Long term current use of anticoagulant 10/06/2010   Renal infarct (HCC) 10/06/2010   Dyslipidemia 09/03/2009   CHEST PAIN-UNSPECIFIED 09/03/2009   SHOULDER PAIN, RIGHT 03/02/2009   PERIPHERAL VASCULAR DISEASE 02/11/2009   SKIN LESION 02/04/2008   Polysubstance abuse (HCC) 05/28/2007   DISORDER, VASCULAR, KIDNEY 05/28/2007   DVT, HX OF 12/25/2006    PCP: Wanda Plump MD   REFERRING PROVIDER: Gershon Mussel  REFERRING DIAG: [phone] (ICD-10-CM) - Status post total replacement of left hip  THERAPY DIAG:  S/P total left hip arthroplasty  Pain in left hip  Stiffness of left hip, not elsewhere classified  Difficulty in walking, not elsewhere classified  Rationale for Evaluation and Treatment: Rehabilitation  ONSET DATE: 02/03/23  SUBJECTIVE:   SUBJECTIVE STATEMENT:  Pt arrives without cane, no complaints today. He notes improvements in walking, basic ADLs, transfers.  It still burns a little at times, comes and goes.  Pain is not usually over a 5/10.   PERTINENT HISTORY: AVN and OA L knee H/o DVT THA 02/03/23   PAIN:  Are you having pain? Yes: NPRS scale: 0/10 Pain location: Lt hip Pain description: tired, sore  Aggravating factors: standing, walking  Relieving factors: rest, has some pain meds  With standing long periods, leg gets tired and pain is 4/10  PRECAUTIONS: None  RED  FLAGS: None   WEIGHT BEARING RESTRICTIONS: No  FALLS:  Has patient fallen in last 6 months? Yes. Number of falls 3  LIVING ENVIRONMENT: Lives with: lives with their spouse Lives in: House/apartment Stairs: Yes: External: 3-4 steps; can reach bothhas ramp  Has following equipment at home: Quad cane large base and Walker - 2 wheeled  OCCUPATION: [employer]; fork lift stand up   PLOF: Independent with basic ADLs, Independent with household mobility with device, Independent with community mobility with device, Needs assistance with ADLs, Vocation/Vocational requirements: drives forklift , and Leisure: likes to fish, watch TV   PATIENT GOALS: Walk, resume mobilty   NEXT MD VISIT: 6 weeks   OBJECTIVE:   PATIENT SURVEYS:  FOTO 40% 05/25/23: 55%   SENSATION: Light touch: Impaired L thigh   EDEMA:  Min per patient   MUSCLE LENGTH: NT   POSTURE: rounded shoulders, forward head, anterior pelvic tilt, left pelvic obliquity, and flexed trunk   PALPATION: Sore to palpation along L thigh, numb proximally along incision   LOWER EXTREMITY ROM:  Active ROM Right eval Left eval Lt.  05/18/23   Hip flexion  110 110  Hip extension     Hip abduction     Hip adduction     Hip internal rotation  15 20  Hip external rotation  25 30  Knee flexion  130    Knee extension  0   Ankle dorsiflexion     Ankle plantarflexion     Ankle inversion     Ankle eversion      (Blank rows = not tested)  LOWER EXTREMITY MMT:  MMT Right eval Left eval Left 04/27/2022 Lt.  05/25/23  Hip flexion 4+ 4- 4- 4  Hip extension      Hip abduction   3 3+  Hip adduction      Hip internal rotation      Hip external rotation      Knee flexion 5 5  5   Knee extension 5 4+ 4+ 5  Ankle dorsiflexion 5 5  5   Ankle plantarflexion      Ankle inversion      Ankle eversion       (Blank rows = not tested)  LOWER EXTREMITY SPECIAL TESTS:  NT  FUNCTIONAL TESTS:  5 times sit to stand: 27 sec wth hands,  unable to push up without UEs for 1st 3 tries   5 X STS: 19 sec  GAIT: Distance walked: 100 Assistive device utilized: Quad cane large base Level of assistance: SBA Comments: mild LOB but recovered, turning head makes him unsteady   TODAY'S TREATMENT:        [facility] Adult PT Treatment:  DATE: 05/25/23 Therapeutic Exercise: Nu Step 5 min, level 6  Wide knees hip rotation  Knee to chest  Hip ER (foot on opp thigh) SLR x 10 LLE with PT assist Banded hip ER/abd cues to slow in supine Banded bridge x 10  Sidelying clam green x 15  Hip flexor stretch Hip extension 3 lbs x 15 standing  Heel raises SLS < 10 sec each LE  [facility] Adult PT Treatment:                                                DATE: 05/23/23 Therapeutic Exercise: Nustep L 6 UE and LE  Step ups to hip abduction   Green band x hip extension x 10 each side  Banded squats x 15  Sidestepping green band  x 8 x 10 feet Hip flexion alternating 3 lbs  2 min walk no cane 334 feet hip tired 2 min walk with cane 330 feet  Hamstring stretch x 3, 30 sec  Knee to chest added some circles  Piriformis (ER) stretch with assist , L foot over Rt thigh   [facility] Adult PT Treatment:                                                DATE: 05/18/23 Therapeutic Exercise: Nustep Supine knee to chest  Lower trunk rotation with knees apart IR, ER L hip AROM hooklying ER and IR Supine green band single leg ER x 10  Banded bridge x 10 green Sidelying IR and ER no band , used ball between knees Sidelying stretch, leg extended back (hip flexor)  Sidelying IR  (LLE on bottom)  Seated IR ball between knees no wgt x 15 Seated knee ext with ball 4 lbs cuff wgt x 15  Standing hip flexor 20 sec x 3 each side   [facility] Adult PT Treatment:                                                DATE: 05/16/23 Therapeutic Exercise: NuStep Hamstring and ITB strap Hip stretching knee to chest and cross over   Trunk  rotation wide knees  Hip ER passive and active  Seated ER stretching  Sit to stand wide knees 15 lbs x 10  Standing wall squats x 10 x 3 sets, added a heel lift to bias each LE Balance in standing with Airex pad: tandem, Narrow, added EC and head turns.  Min LOB when eyes closed.      [facility] Adult PT Treatment:                                                DATE: 05/09/23 Therapeutic Exercise: NuStep L 5 Ue and LE for 6 min  Standing at wall, isometric hold x 30 sec, added opp heel lift and hold 15 sec  Corner for balance on Airex : march, head turns, nods  Sit to stand Airex on mat 2 x  10, used 10 lbs for 2nd set  Seated march blue band  Supine bridge blue band x 10 wide knees  Alt. Blue band knee fall out x 10 Bridge and alt. Knee fall out 3 x 5 reps blue band Clam blue band x 10  Hip abduction x 10  Standing step ups x 10 , 6 inch with min A bilateral UEs  Modalities: MHP 6 min L ant hip.     [facility] Adult PT Treatment:                                                DATE: 05/02/23 Therapeutic Exercise: NuStep L5 UE and LE for 5 min  Sidelying ant hip stretch  Hip abduction lift x 10 , 2 sets 1 set with bent knee (hydrant) Clam x 10 no band then x 10 green band  Bridge 2 x 10 with band  Clam double leg x 15  March hip flex green band x   10 Prop on elbow quad set to SLR x 10  LLE stretch off table with knee flexed for quad and ant hip stretch  Light push into knee flexion  Therapeutic Activity: 2 min walk 297 feet , no increased pain just "worn out" TUG: with cane 17.7 sec, then 13.2 sec 2nd attempt.  Done without cane 12.5 sec    PATIENT EDUCATION:  Education details: HEP Person educated: Patient and Child(ren) Education method: Explanation, Demonstration, Verbal cues, and Handouts Education comprehension: verbalized understanding, returned demonstration, and needs further education  HOME EXERCISE PROGRAM: Access Code: [email or web address] URL:  [email or web address] Date: 04/13/2023 Prepared by: Karie Mainland  Exercises - Supine Bridge  - 1 x daily - 7 x weekly - 2 sets - 10 reps - 5 hold - Beginner Side Leg Lift  - 1 x daily - 7 x weekly - 2 sets - 10 reps - 5 hold - Hooklying Straight Leg Raise  - 1 x daily - 7 x weekly - 2 sets - 10 reps - 5 hold - Hooklying Clamshell with Resistance  - 1 x daily - 7 x weekly - 2 sets - 10 reps - 5 hold - Supine Hip Internal and External Rotation  - 1 x daily - 7 x weekly - 2 sets - 10 reps - 10 hold -see medbridge    ASSESSMENT:  CLINICAL IMPRESSION: Patient has improved in general mobility and strength.  He cont to have weakness in L and R hip/glutes, abnormal gait and some stiffness in L hip. He has improved Sit to stand, gait and FOTO score. He will continue to benefit from skilled PT to optimize gait, balance and strength.   OBJECTIVE IMPAIRMENTS: Abnormal gait, decreased activity tolerance, decreased balance, decreased knowledge of use of DME, decreased mobility, difficulty walking, decreased ROM, decreased strength, increased fascial restrictions, impaired flexibility, postural dysfunction, and pain.   ACTIVITY LIMITATIONS: carrying, lifting, bending, standing, squatting, sleeping, stairs, transfers, bed mobility, dressing, and locomotion level  PARTICIPATION LIMITATIONS: cleaning, interpersonal relationship, driving, shopping, community activity, occupation, and yard work  PERSONAL FACTORS: Profession and 1 comorbidity: none   are also affecting patient's functional outcome.   REHAB POTENTIAL: Excellent  CLINICAL DECISION MAKING: Stable/uncomplicated  EVALUATION COMPLEXITY: Low   GOALS: Goals reviewed with patient? Yes  SHORT TERM GOALS: Target date: 05/11/2023   Pt will be I with HEP for L  hip ROM and strength  Baseline: Goal status: ongoing  2.  Patient will be able to transfer sit to supine without the need for hands to lift leg.  Baseline: L hip  weakness, needs A . Update: he can do this sometimes, still a strain Goal status: ongoing   3.  Patient will be able to perform sit to stand without use of hands x 10 Baseline: unable . Update : done off standard mat table with AIREX under hips  Goal status: MET  4.  Pt will be able to walk with a cane in the clinic without cues for sequencing Baseline: needs cues  Goal status: MET   5.  Pt will be screened for balance deficits and goal set  Baseline: Unable to do this on evaluation Goal status: MET    LONG TERM GOALS: Target date: 06/08/2023    Patient will be independent with home program at the end of therapy Baseline: Unknown Goal status: ongoing   2.  Foto score will improve to 66% to demonstrate functional mobility increase Baseline: 40% 05/25/23: 58% Goal status: ongoing   3.  Patient will be able to walk to the grocery store with min  increase in fatigue, pain for 30- 45 min  Baseline: limited by fatigue and pain, 20-30 min  Goal status: ongoing   4.  Pt will demo 5/5 strength in L hip for optimal gait and mobility  Baseline: 4-/5 05/25/23 4/5 hip flex and glutes 3+/5 Goal status: ongoing   5.  Patient will report no difficulty with car/bed transfers Baseline: needs A from hands 10/17 improving , min occ difficulty  Goal status: ongoing   6.  Pt will be able to complete 2 min walk test without cane, 350 feet  Baseline: 297 with cane . 10/17 334 without cane  Goal status: ongoing   PLAN:   PT FREQUENCY: 2x/week  PT DURATION: 8 weeks  PLANNED INTERVENTIONS: Therapeutic exercises, Therapeutic activity, Neuromuscular re-education, Balance training, Gait training, Patient/Family education, Self Care, Joint mobilization, Stair training, DME instructions, Spinal mobilization, Cryotherapy, Moist heat, Manual therapy, and Re-evaluation  PLAN FOR NEXT SESSION: gait, endurance, standing: step ups, hip abd and ext strength , add banded hip ex.   Karie Mainland,  PT 05/25/23 10:59 AM Phone: [phone] Fax: [phone]

## 2023-05-26 DIAGNOSIS — H5213 Myopia, bilateral: Secondary | ICD-10-CM | POA: Diagnosis not present

## 2023-05-30 ENCOUNTER — Ambulatory Visit (INDEPENDENT_AMBULATORY_CARE_PROVIDER_SITE_OTHER): Payer: Medicaid Other | Admitting: Orthopaedic Surgery

## 2023-05-30 DIAGNOSIS — Z96642 Presence of left artificial hip joint: Secondary | ICD-10-CM | POA: Diagnosis not present

## 2023-05-30 NOTE — Progress Notes (Signed)
Office Visit Note   Patient: [name]           Date of Birth: 1960/08/29           MRN: [id number] Visit Date: 05/30/2023              Requested by: Wanda Plump, MD [address] [place],  [place] [address] PCP: Wanda Plump, MD   Assessment & Plan: Visit Diagnoses:  1. Status post total replacement of left hip     Plan: Derly is now 4 months status post left total hip replacement.  His only complaint is start up stiffness.  I do not feel that he needs to continue outpatient PT as long as he does his home exercises.  Dental prophylaxis reinforced.  Recheck in another 8 months with repeat pelvis x-rays.  Follow-Up Instructions: Return in about 8 months (around 01/28/2024).   Orders:  No orders of the defined types were placed in this encounter.  No orders of the defined types were placed in this encounter.     Procedures: No procedures performed   Clinical Data: No additional findings.   Subjective: Chief Complaint  Patient presents with   Left Hip - Follow-up    02/03/23 LT THA    HPI [name] is a 62 year old gentleman who is 4 months postop from a left total hip replacement.  He is doing well overall.  Reports some achiness in the morning but otherwise no real complaints.  Review of Systems  Constitutional: Negative.   HENT: Negative.    Eyes: Negative.   Respiratory: Negative.    Cardiovascular: Negative.   Gastrointestinal: Negative.   Endocrine: Negative.   Genitourinary: Negative.   Skin: Negative.   Allergic/Immunologic: Negative.   Neurological: Negative.   Hematological: Negative.   Psychiatric/Behavioral: Negative.    All other systems reviewed and are negative.    Objective: Vital Signs: There were no vitals taken for this visit.  Physical Exam Vitals and nursing note reviewed.  Constitutional:      Appearance: He is well-developed.  Pulmonary:     Effort: Pulmonary effort is normal.  Abdominal:      Palpations: Abdomen is soft.  Skin:    General: Skin is warm.  Neurological:     Mental Status: He is alert and oriented to person, place, and time.  Psychiatric:        Behavior: Behavior normal.        Thought Content: Thought content normal.        Judgment: Judgment normal.     Ortho Exam Exam of the left hip shows fully healed surgical scar.  Fluid painless range of motion.  Slight start up stiffness. Specialty Comments:  No specialty comments available.  Imaging: No results found.   PMFS History: Patient Active Problem List   Diagnosis Date Noted   Status post total replacement of left hip 02/03/2023   Primary osteoarthritis of left hip 02/02/2023   Avascular necrosis of bone of left hip (HCC) 12/23/2022   High risk heterosexual behavior 02/05/2021   History of colonic polyps 11/04/2019   PCP NOTES >>>>>>>>>>>>>> 07/26/2016   GERD (gastroesophageal reflux disease) 07/26/2016   Annual physical exam 07/25/2016   HTN (hypertension) 09/16/2014   Nausea without vomiting 11/29/2013   Deep vein thrombosis (HCC) 10/06/2010   Long term current use of anticoagulant 10/06/2010   Renal infarct (HCC) 10/06/2010   Dyslipidemia 09/03/2009   CHEST PAIN-UNSPECIFIED 09/03/2009  SHOULDER PAIN, RIGHT 03/02/2009   PERIPHERAL VASCULAR DISEASE 02/11/2009   SKIN LESION 02/04/2008   Polysubstance abuse (HCC) 05/28/2007   DISORDER, VASCULAR, KIDNEY 05/28/2007   DVT, HX OF 12/25/2006   Past Medical History:  Diagnosis Date   Chest pain    stres test neg x 2, cath 5/12; minimal LAD irregs; no obs CAD; normal LVF   Clotting disorder (HCC)    dvt   GERD (gastroesophageal reflux disease)    History of DVT of lower extremity    on chronic coumadin   Hypertension    Internal hemorrhoids    Cscope 2007   PAD (peripheral artery disease) (HCC)    a. left leg ischemia tx wtih embolectomy of left fem, pop, tib arteries with Dr. Edilia Bo - 08/2006;  b. known occlusion of right pop with  collats - med Rx;  c.ABI's 8/09: R 1.0; L 0.91    PFO (patent foramen ovale)    per 08/2006 discharge summary, TEE showed EF 60%, small PFO with minimal right-to-left shunt with Valsalva; not felt to be the source of emboli, lifelong Coumadin recommended   Pneumonia    history of   Polysubstance abuse (HCC)    marijuana only   Renal infarct (HCC)    R    Family History  Problem Relation Age of Onset   Hypertension Mother    Breast cancer Mother    Hypertension Father    CAD Father    Diabetes Father    Lung cancer Maternal Uncle    Pancreatic cancer Brother    Prostate cancer Maternal Uncle    Heart disease Paternal Grandmother    Colon cancer Neg Hx    Stomach cancer Neg Hx    Esophageal cancer Neg Hx    Rectal cancer Neg Hx     Past Surgical History:  Procedure Laterality Date   CHOLECYSTECTOMY     COLONOSCOPY     INGUINAL HERNIA REPAIR Left 07/16/2019   Procedure: LAPAROSCOPIC LEFT INGUINAL HERNIA REPAIR WITH MESH;  Surgeon: Axel Filler, MD;  Location: [facility] OR;  Service: General;  Laterality: Left;   left leg blood clot removal 2009  2009   TONSILLECTOMY     TOTAL HIP ARTHROPLASTY Left 02/03/2023   Procedure: LEFT TOTAL HIP ARTHROPLASTY ANTERIOR APPROACH;  Surgeon: Tarry Kos, MD;  Location: [facility] OR;  Service: Orthopedics;  Laterality: Left;  3-C   WRIST SURGERY Left    Social History   Occupational History   Occupation: [employer], driver   Tobacco Use   Smoking status: Every Day    Current packs/day: 1.00    Average packs/day: 1 pack/day for 30.0 years (30.0 ttl pk-yrs)    Types: Cigarettes   Smokeless tobacco: Never   Tobacco comments:    1 to 1.5 ppd   Vaping Use   Vaping status: Never Used  Substance and Sexual Activity   Alcohol use: Yes    Alcohol/week: 21.0 standard drinks of alcohol    Types: 21 Standard drinks or equivalent per week    Comment: 3 drinks per day, every day   Drug use: Yes    Types: Marijuana    Comment: smokes every day   Sexual  activity: Yes    Partners: Female

## 2023-05-31 ENCOUNTER — Encounter: Payer: Self-pay | Admitting: Internal Medicine

## 2023-06-01 NOTE — Therapy (Signed)
OUTPATIENT PHYSICAL THERAPY TREATMENT THERAPY NOTE    Patient Name: [name] MRN: [id number] DOB:[birth year], 62 y.o., male Today's Date: 06/02/2023  END OF SESSION:  PT End of Session - 06/02/23 0933     Visit Number 9    Number of Visits 16    Date for PT Re-Evaluation 07/06/23    Authorization Type [place] [facility] [employer]    Authorization - Number of Visits 27    PT Start Time 0932    PT Stop Time 1015    PT Time Calculation (min) 43 min    Activity Tolerance Patient tolerated treatment well    Behavior During Therapy WFL for tasks assessed/performed                    Past Medical History:  Diagnosis Date   Chest pain    stres test neg x 2, cath 5/12; minimal LAD irregs; no obs CAD; normal LVF   Clotting disorder (HCC)    dvt   GERD (gastroesophageal reflux disease)    History of DVT of lower extremity    on chronic coumadin   Hypertension    Internal hemorrhoids    Cscope 2007   PAD (peripheral artery disease) (HCC)    a. left leg ischemia tx wtih embolectomy of left fem, pop, tib arteries with Dr. Edilia Bo - 08/2006;  b. known occlusion of right pop with collats - med Rx;  c.ABI's 8/09: R 1.0; L 0.91    PFO (patent foramen ovale)    per 08/2006 discharge summary, TEE showed EF 60%, small PFO with minimal right-to-left shunt with Valsalva; not felt to be the source of emboli, lifelong Coumadin recommended   Pneumonia    history of   Polysubstance abuse (HCC)    marijuana only   Renal infarct (HCC)    R   Past Surgical History:  Procedure Laterality Date   CHOLECYSTECTOMY     COLONOSCOPY     INGUINAL HERNIA REPAIR Left 07/16/2019   Procedure: LAPAROSCOPIC LEFT INGUINAL HERNIA REPAIR WITH MESH;  Surgeon: Axel Filler, MD;  Location: [facility] OR;  Service: General;  Laterality: Left;   left leg blood clot removal 2009  2009   TONSILLECTOMY     TOTAL HIP ARTHROPLASTY Left 02/03/2023   Procedure: LEFT TOTAL HIP ARTHROPLASTY ANTERIOR APPROACH;  Surgeon: Tarry Kos, MD;  Location: [facility] OR;  Service: Orthopedics;  Laterality: Left;  3-C   WRIST SURGERY Left    Patient Active Problem List   Diagnosis Date Noted   Status post total replacement of left hip 02/03/2023   Primary osteoarthritis of left hip 02/02/2023   Avascular necrosis of bone of left hip (HCC) 12/23/2022   High risk heterosexual behavior 02/05/2021   History of colonic polyps 11/04/2019   PCP NOTES >>>>>>>>>>>>>> 07/26/2016   GERD (gastroesophageal reflux disease) 07/26/2016   Annual physical exam 07/25/2016   HTN (hypertension) 09/16/2014   Nausea without vomiting 11/29/2013   Deep vein thrombosis (HCC) 10/06/2010   Long term current use of anticoagulant 10/06/2010   Renal infarct (HCC) 10/06/2010   Dyslipidemia 09/03/2009   CHEST PAIN-UNSPECIFIED 09/03/2009   SHOULDER PAIN, RIGHT 03/02/2009   PERIPHERAL VASCULAR DISEASE 02/11/2009   SKIN LESION 02/04/2008   Polysubstance abuse (HCC) 05/28/2007   DISORDER, VASCULAR, KIDNEY 05/28/2007   DVT, HX OF 12/25/2006    PCP: Wanda Plump MD   REFERRING PROVIDER: Gershon Mussel  REFERRING DIAG: [phone] (ICD-10-CM) - Status post total replacement of left  hip  THERAPY DIAG:  S/P total left hip arthroplasty  Pain in left hip  Stiffness of left hip, not elsewhere classified  Difficulty in walking, not elsewhere classified  Rationale for Evaluation and Treatment: Rehabilitation  ONSET DATE: 02/03/23  SUBJECTIVE:   SUBJECTIVE STATEMENT:  Pt arrives without cane, no complaints today. He notes improvements in walking, basic ADLs, transfers.  It still burns a little at times, comes and goes.  Pain is not usually over a 5/10.   PERTINENT HISTORY: AVN and OA L knee H/o DVT THA 02/03/23   PAIN:  Are you having pain? Yes: NPRS scale: 0/10 Pain location: Lt hip Pain description: tired, sore  Aggravating factors: standing, walking  Relieving factors: rest, has some pain meds  With standing long periods, leg gets tired and  pain is 4/10  PRECAUTIONS: None  RED FLAGS: None   WEIGHT BEARING RESTRICTIONS: No  FALLS:  Has patient fallen in last 6 months? Yes. Number of falls 3  LIVING ENVIRONMENT: Lives with: lives with their spouse Lives in: House/apartment Stairs: Yes: External: 3-4 steps; can reach bothhas ramp  Has following equipment at home: Quad cane large base and Walker - 2 wheeled  OCCUPATION: [employer]; fork lift stand up   PLOF: Independent with basic ADLs, Independent with household mobility with device, Independent with community mobility with device, Needs assistance with ADLs, Vocation/Vocational requirements: drives forklift , and Leisure: likes to fish, watch TV   PATIENT GOALS: Walk, resume mobilty   NEXT MD VISIT: 6 weeks   OBJECTIVE:   PATIENT SURVEYS:  FOTO 40% 05/25/23: 55%   SENSATION: Light touch: Impaired L thigh   EDEMA:  Min per patient   MUSCLE LENGTH: NT   POSTURE: rounded shoulders, forward head, anterior pelvic tilt, left pelvic obliquity, and flexed trunk   PALPATION: Sore to palpation along L thigh, numb proximally along incision   LOWER EXTREMITY ROM:  Active ROM Right eval Left eval Lt.  05/18/23   Hip flexion  110 110  Hip extension     Hip abduction     Hip adduction     Hip internal rotation  15 20  Hip external rotation  25 30  Knee flexion  130    Knee extension  0   Ankle dorsiflexion     Ankle plantarflexion     Ankle inversion     Ankle eversion      (Blank rows = not tested)  LOWER EXTREMITY MMT:  MMT Right eval Left eval Left 04/27/2022 Lt.  05/25/23  Hip flexion 4+ 4- 4- 4  Hip extension      Hip abduction   3 3+  Hip adduction      Hip internal rotation      Hip external rotation      Knee flexion 5 5  5   Knee extension 5 4+ 4+ 5  Ankle dorsiflexion 5 5  5   Ankle plantarflexion      Ankle inversion      Ankle eversion       (Blank rows = not tested)  LOWER EXTREMITY SPECIAL TESTS:  NT  FUNCTIONAL TESTS:  5  times sit to stand: 27 sec wth hands, unable to push up without UEs for 1st 3 tries   5 X STS: 19 sec  GAIT: Distance walked: 100 Assistive device utilized: Quad cane large base Level of assistance: SBA Comments: mild LOB but recovered, turning head makes him unsteady   TODAY'S TREATMENT:  [facility] Adult PT Treatment:                                                DATE: 06/02/23 Therapeutic Exercise: Nu Step 5 min, level 6  Knee to chest  Hip ER (foot on opp thigh) Marching x10 each SLR x 10 LLE with PT assist Banded hip ER/abd cues to slow in supine Banded bridge x 10  Sidelying clam green x 15  STS elevated mat table 2x10 Hip abd 3 lbs 2x10 each standing  Heel raises  [facility] Adult PT Treatment:                                                DATE: 05/25/23 Therapeutic Exercise: Nu Step 5 min, level 6  Wide knees hip rotation  Knee to chest  Hip ER (foot on opp thigh) SLR x 10 LLE with PT assist Banded hip ER/abd cues to slow in supine x15 Banded bridge x 12 Hip extension 3 lbs x 15 standing  Heel raises SLS < 10 sec each LE   [facility] Adult PT Treatment:                                                DATE: 05/23/23 Therapeutic Exercise: Nustep L 6 UE and LE  Step ups to hip abduction   Green band x hip extension x 10 each side  Banded squats x 15  Sidestepping green band  x 8 x 10 feet Hip flexion alternating 3 lbs  2 min walk no cane 334 feet hip tired 2 min walk with cane 330 feet  Hamstring stretch x 3, 30 sec  Knee to chest added some circles  Piriformis (ER) stretch with assist , L foot over Rt thigh   [facility] Adult PT Treatment:                                                DATE: 05/18/23 Therapeutic Exercise: Nustep Supine knee to chest  Lower trunk rotation with knees apart IR, ER L hip AROM hooklying ER and IR Supine green band single leg ER x 10  Banded bridge x 10 green Sidelying IR and ER no band , used ball between knees Sidelying stretch, leg  extended back (hip flexor)  Sidelying IR  (LLE on bottom)  Seated IR ball between knees no wgt x 15 Seated knee ext with ball 4 lbs cuff wgt x 15  Standing hip flexor 20 sec x 3 each side   [facility] Adult PT Treatment:                                                DATE: 05/16/23 Therapeutic Exercise: NuStep Hamstring and ITB strap Hip stretching knee to chest and cross over   Trunk rotation wide knees  Hip  ER passive and active  Seated ER stretching  Sit to stand wide knees 15 lbs x 10  Standing wall squats x 10 x 3 sets, added a heel lift to bias each LE Balance in standing with Airex pad: tandem, Narrow, added EC and head turns.  Min LOB when eyes closed.      PATIENT EDUCATION:  Education details: HEP Person educated: Patient and Child(ren) Education method: Programmer, multimedia, Facilities manager, Verbal cues, and Handouts Education comprehension: verbalized understanding, returned demonstration, and needs further education  HOME EXERCISE PROGRAM: Access Code: [email or web address] URL: [email or web address] Date: 04/13/2023 Prepared by: Karie Mainland  Exercises - Supine Bridge  - 1 x daily - 7 x weekly - 2 sets - 10 reps - 5 hold - Beginner Side Leg Lift  - 1 x daily - 7 x weekly - 2 sets - 10 reps - 5 hold - Hooklying Straight Leg Raise  - 1 x daily - 7 x weekly - 2 sets - 10 reps - 5 hold - Hooklying Clamshell with Resistance  - 1 x daily - 7 x weekly - 2 sets - 10 reps - 5 hold - Supine Hip Internal and External Rotation  - 1 x daily - 7 x weekly - 2 sets - 10 reps - 10 hold -see medbridge    ASSESSMENT:  CLINICAL IMPRESSION: PT was completed for LE strengthening in the open and closed kinetic chain. Pt progressed to completing L SLR without assist. Pt is making appropriate progress. Pt tolerated PT today without adverse effects. Pt will continue to benefit from skilled PT to address impairments for improved function    OBJECTIVE IMPAIRMENTS: Abnormal gait, decreased activity  tolerance, decreased balance, decreased knowledge of use of DME, decreased mobility, difficulty walking, decreased ROM, decreased strength, increased fascial restrictions, impaired flexibility, postural dysfunction, and pain.   ACTIVITY LIMITATIONS: carrying, lifting, bending, standing, squatting, sleeping, stairs, transfers, bed mobility, dressing, and locomotion level  PARTICIPATION LIMITATIONS: cleaning, interpersonal relationship, driving, shopping, community activity, occupation, and yard work  PERSONAL FACTORS: Profession and 1 comorbidity: none   are also affecting patient's functional outcome.   REHAB POTENTIAL: Excellent  CLINICAL DECISION MAKING: Stable/uncomplicated  EVALUATION COMPLEXITY: Low   GOALS: Goals reviewed with patient? Yes  SHORT TERM GOALS: Target date: 05/11/2023   Pt will be I with HEP for L hip ROM and strength  Baseline: Goal status: ongoing  2.  Patient will be able to transfer sit to supine without the need for hands to lift leg.  Baseline: L hip weakness, needs A . Update: he can do this sometimes, still a strain Goal status: ongoing   3.  Patient will be able to perform sit to stand without use of hands x 10 Baseline: unable . Update : done off standard mat table with AIREX under hips  Goal status: MET  4.  Pt will be able to walk with a cane in the clinic without cues for sequencing Baseline: needs cues  Goal status: MET   5.  Pt will be screened for balance deficits and goal set  Baseline: Unable to do this on evaluation Goal status: MET    LONG TERM GOALS: Target date: 06/08/2023    Patient will be independent with home program at the end of therapy Baseline: Unknown Goal status: ongoing   2.  Foto score will improve to 66% to demonstrate functional mobility increase Baseline: 40% 05/25/23: 58% Goal status: ongoing   3.  Patient will be able to walk to the grocery  store with min  increase in fatigue, pain for 30- 45 min  Baseline:  limited by fatigue and pain, 20-30 min  Goal status: ongoing   4.  Pt will demo 5/5 strength in L hip for optimal gait and mobility  Baseline: 4-/5 05/25/23 4/5 hip flex and glutes 3+/5 Goal status: ongoing   5.  Patient will report no difficulty with car/bed transfers Baseline: needs A from hands 10/17 improving , min occ difficulty  Goal status: ongoing   6.  Pt will be able to complete 2 min walk test without cane, 350 feet  Baseline: 297 with cane . 10/17 334 without cane  Goal status: ongoing   PLAN:   PT FREQUENCY: 2x/week  PT DURATION: 8 weeks  PLANNED INTERVENTIONS: Therapeutic exercises, Therapeutic activity, Neuromuscular re-education, Balance training, Gait training, Patient/Family education, Self Care, Joint mobilization, Stair training, DME instructions, Spinal mobilization, Cryotherapy, Moist heat, Manual therapy, and Re-evaluation  PLAN FOR NEXT SESSION: gait, endurance, standing: step ups, hip abd and ext strength , add banded hip ex.   Anuhea Gassner MS, PT 06/02/23 10:19 AM

## 2023-06-02 ENCOUNTER — Ambulatory Visit: Payer: Medicaid Other

## 2023-06-02 DIAGNOSIS — M25652 Stiffness of left hip, not elsewhere classified: Secondary | ICD-10-CM

## 2023-06-02 DIAGNOSIS — Z96642 Presence of left artificial hip joint: Secondary | ICD-10-CM | POA: Diagnosis not present

## 2023-06-02 DIAGNOSIS — M25552 Pain in left hip: Secondary | ICD-10-CM

## 2023-06-02 DIAGNOSIS — R262 Difficulty in walking, not elsewhere classified: Secondary | ICD-10-CM

## 2023-06-08 ENCOUNTER — Ambulatory Visit: Payer: Medicaid Other | Admitting: Physical Therapy

## 2023-06-09 ENCOUNTER — Ambulatory Visit: Payer: 59 | Attending: Orthopaedic Surgery

## 2023-06-09 DIAGNOSIS — M25652 Stiffness of left hip, not elsewhere classified: Secondary | ICD-10-CM | POA: Diagnosis not present

## 2023-06-09 DIAGNOSIS — Z96642 Presence of left artificial hip joint: Secondary | ICD-10-CM | POA: Diagnosis not present

## 2023-06-09 DIAGNOSIS — M25552 Pain in left hip: Secondary | ICD-10-CM | POA: Insufficient documentation

## 2023-06-09 DIAGNOSIS — R262 Difficulty in walking, not elsewhere classified: Secondary | ICD-10-CM | POA: Diagnosis not present

## 2023-06-13 ENCOUNTER — Ambulatory Visit: Payer: Medicaid Other

## 2023-06-13 DIAGNOSIS — M25652 Stiffness of left hip, not elsewhere classified: Secondary | ICD-10-CM

## 2023-06-13 DIAGNOSIS — M25552 Pain in left hip: Secondary | ICD-10-CM | POA: Diagnosis not present

## 2023-06-13 DIAGNOSIS — R262 Difficulty in walking, not elsewhere classified: Secondary | ICD-10-CM

## 2023-06-13 DIAGNOSIS — Z96642 Presence of left artificial hip joint: Secondary | ICD-10-CM | POA: Diagnosis not present

## 2023-06-13 NOTE — Therapy (Signed)
OUTPATIENT PHYSICAL THERAPY TREATMENT THERAPY NOTE    Patient Name: [name] MRN: [id number] DOB:02/11/1961, 62 y.o., male Today's Date: 06/13/2023  END OF SESSION:  PT End of Session - 06/13/23 0941     Visit Number 11    Number of Visits 16    Date for PT Re-Evaluation 07/06/23    Authorization Type [place] [facility] [employer]    Authorization - Number of Visits 27    PT Start Time 0935    PT Stop Time 1015    PT Time Calculation (min) 40 min    Activity Tolerance Patient tolerated treatment well    Behavior During Therapy WFL for tasks assessed/performed                      Past Medical History:  Diagnosis Date   Chest pain    stres test neg x 2, cath 5/12; minimal LAD irregs; no obs CAD; normal LVF   Clotting disorder (HCC)    dvt   GERD (gastroesophageal reflux disease)    History of DVT of lower extremity    on chronic coumadin   Hypertension    Internal hemorrhoids    Cscope 2007   PAD (peripheral artery disease) (HCC)    a. left leg ischemia tx wtih embolectomy of left fem, pop, tib arteries with Dr. Edilia Bo - 08/2006;  b. known occlusion of right pop with collats - med Rx;  c.ABI's 8/09: R 1.0; L 0.91    PFO (patent foramen ovale)    per 08/2006 discharge summary, TEE showed EF 60%, small PFO with minimal right-to-left shunt with Valsalva; not felt to be the source of emboli, lifelong Coumadin recommended   Pneumonia    history of   Polysubstance abuse (HCC)    marijuana only   Renal infarct [facility])    R   Past Surgical History:  Procedure Laterality Date   CHOLECYSTECTOMY     COLONOSCOPY     INGUINAL HERNIA REPAIR Left 07/16/2019   Procedure: LAPAROSCOPIC LEFT INGUINAL HERNIA REPAIR WITH MESH;  Surgeon: Axel Filler, MD;  Location: [facility] OR;  Service: General;  Laterality: Left;   left leg blood clot removal 2009  2009   TONSILLECTOMY     TOTAL HIP ARTHROPLASTY Left 02/03/2023   Procedure: LEFT TOTAL HIP ARTHROPLASTY ANTERIOR APPROACH;  Surgeon:  Tarry Kos, MD;  Location: [facility] OR;  Service: Orthopedics;  Laterality: Left;  3-C   WRIST SURGERY Left    Patient Active Problem List   Diagnosis Date Noted   Status post total replacement of left hip 02/03/2023   Primary osteoarthritis of left hip 02/02/2023   Avascular necrosis of bone of left hip (HCC) 12/23/2022   High risk heterosexual behavior 02/05/2021   History of colonic polyps 11/04/2019   PCP NOTES >>>>>>>>>>>>>> 07/26/2016   GERD (gastroesophageal reflux disease) 07/26/2016   Annual physical exam 07/25/2016   HTN (hypertension) 09/16/2014   Nausea without vomiting 11/29/2013   Deep vein thrombosis (HCC) 10/06/2010   Long term current use of anticoagulant 10/06/2010   Renal infarct (HCC) 10/06/2010   Dyslipidemia 09/03/2009   CHEST PAIN-UNSPECIFIED 09/03/2009   SHOULDER PAIN, RIGHT 03/02/2009   PERIPHERAL VASCULAR DISEASE 02/11/2009   SKIN LESION 02/04/2008   Polysubstance abuse (HCC) 05/28/2007   DISORDER, VASCULAR, KIDNEY 05/28/2007   DVT, HX OF 12/25/2006    PCP: Wanda Plump MD   REFERRING PROVIDER: Gershon Mussel  REFERRING DIAG: [id number] (ICD-10-CM) - Status post total replacement  of left hip  THERAPY DIAG:  S/P total left hip arthroplasty  Pain in left hip  Stiffness of left hip, not elsewhere classified  Difficulty in walking, not elsewhere classified  Rationale for Evaluation and Treatment: Rehabilitation  ONSET DATE: 02/03/23  SUBJECTIVE:   SUBJECTIVE STATEMENT: Pt reports his L leg is improving.  PERTINENT HISTORY: AVN and OA L knee H/o DVT THA 02/03/23   PAIN:  Are you having pain? Yes: NPRS scale: 1/10 Pain location: Lt hip Pain description: tired, sore  Aggravating factors: standing, walking  Relieving factors: rest, has some pain meds  With standing long periods, leg gets tired and pain is 4/10  PRECAUTIONS: None  RED FLAGS: None   WEIGHT BEARING RESTRICTIONS: No  FALLS:  Has patient fallen in last 6 months? Yes. Number  of falls 3  LIVING ENVIRONMENT: Lives with: lives with their spouse Lives in: House/apartment Stairs: Yes: External: 3-4 steps; can reach bothhas ramp  Has following equipment at home: Quad cane large base and Walker - 2 wheeled  OCCUPATION: [employer]; fork lift stand up   PLOF: Independent with basic ADLs, Independent with household mobility with device, Independent with community mobility with device, Needs assistance with ADLs, Vocation/Vocational requirements: drives forklift , and Leisure: likes to fish, watch TV   PATIENT GOALS: Walk, resume mobilty   NEXT MD VISIT: 6 weeks   OBJECTIVE:   PATIENT SURVEYS:  FOTO 40% 05/25/23: 55%   SENSATION: Light touch: Impaired L thigh   EDEMA:  Min per patient   MUSCLE LENGTH: NT   POSTURE: rounded shoulders, forward head, anterior pelvic tilt, left pelvic obliquity, and flexed trunk   PALPATION: Sore to palpation along L thigh, numb proximally along incision   LOWER EXTREMITY ROM:  Active ROM Right eval Left eval Lt.  05/18/23   Hip flexion  110 110  Hip extension     Hip abduction     Hip adduction     Hip internal rotation  15 20  Hip external rotation  25 30  Knee flexion  130    Knee extension  0   Ankle dorsiflexion     Ankle plantarflexion     Ankle inversion     Ankle eversion      (Blank rows = not tested)  LOWER EXTREMITY MMT:  MMT Right eval Left eval Left 04/27/2022 Lt.  05/25/23  Hip flexion 4+ 4- 4- 4  Hip extension      Hip abduction   3 3+  Hip adduction      Hip internal rotation      Hip external rotation      Knee flexion 5 5  5   Knee extension 5 4+ 4+ 5  Ankle dorsiflexion 5 5  5   Ankle plantarflexion      Ankle inversion      Ankle eversion       (Blank rows = not tested)  LOWER EXTREMITY SPECIAL TESTS:  NT  FUNCTIONAL TESTS:  5 times sit to stand: 27 sec wth hands, unable to push up without UEs for 1st 3 tries   5 X STS: 19 sec  GAIT: Distance walked: 100 Assistive device  utilized: Quad cane large base Level of assistance: SBA Comments: mild LOB but recovered, turning head makes him unsteady   TODAY'S TREATMENT: [facility] Adult PT Treatment:  DATE: 06/13/23 Therapeutic Exercise: Nu Step 5 min, level 4 LE  LAQ 2x10 4# Marching 2x15 each on airex Lateral step ups 6" 2x10 STS chair c airex 2x10 Heel raises x20      [facility] Adult PT Treatment:                                                DATE: 06/09/23 Therapeutic Exercise: Nu Step 5 min, level 4 LE  Knee to chest  Hip ER (foot on opp thigh) Marching x15 each SLR x 10 LLE PT  Banded hip ER/abd GTB x10 Banded bridge x 10 BluTB STS elevated mat table x10 LAQ x10 3# Heel raises x10   [facility] Adult PT Treatment:                                                DATE: 06/02/23 Therapeutic Exercise: Nu Step 5 min, level 6  Knee to chest  Hip ER (foot on opp thigh) Marching x10 each SLR x 10 LLE with PT assist Banded hip ER/abd cues to slow in supine Banded bridge x 10  Sidelying clam green x 15  STS elevated mat table 2x10 Hip abd 3 lbs 2x10 each standing  Heel raises  [facility] Adult PT Treatment:                                                DATE: 05/25/23 Therapeutic Exercise: Nu Step 5 min, level 6  Wide knees hip rotation  Knee to chest  Hip ER (foot on opp thigh) SLR x 10 LLE with PT assist Banded hip ER/abd cues to slow in supine x15 Banded bridge x 12 Hip extension 3 lbs x 15 standing  Heel raises SLS < 10 sec each LE     PATIENT EDUCATION:  Education details: HEP Person educated: Patient and Child(ren) Education method: Programmer, multimedia, Facilities manager, Verbal cues, and Handouts Education comprehension: verbalized understanding, returned demonstration, and needs further education  HOME EXERCISE PROGRAM: Access Code: [email or web address] URL: [email or web address] Date: 04/13/2023 Prepared by: Karie Mainland  Exercises - Supine Bridge  - 1 x  daily - 7 x weekly - 2 sets - 10 reps - 5 hold - Beginner Side Leg Lift  - 1 x daily - 7 x weekly - 2 sets - 10 reps - 5 hold - Hooklying Straight Leg Raise  - 1 x daily - 7 x weekly - 2 sets - 10 reps - 5 hold - Hooklying Clamshell with Resistance  - 1 x daily - 7 x weekly - 2 sets - 10 reps - 5 hold - Supine Hip Internal and External Rotation  - 1 x daily - 7 x weekly - 2 sets - 10 reps - 10 hold -see medbridge    ASSESSMENT:  CLINICAL IMPRESSION: PT focused on LE strengthening in the open and closed kinetic chains. Pt walks with min gait deficit related to L hip/LE weakness. Pt tolerated PT today without adverse effects. L hip/LE is progressing, but at a slow pace. Pt will continue to benefit from skilled PT to address impairments for improved  function    OBJECTIVE IMPAIRMENTS: Abnormal gait, decreased activity tolerance, decreased balance, decreased knowledge of use of DME, decreased mobility, difficulty walking, decreased ROM, decreased strength, increased fascial restrictions, impaired flexibility, postural dysfunction, and pain.   ACTIVITY LIMITATIONS: carrying, lifting, bending, standing, squatting, sleeping, stairs, transfers, bed mobility, dressing, and locomotion level  PARTICIPATION LIMITATIONS: cleaning, interpersonal relationship, driving, shopping, community activity, occupation, and yard work  PERSONAL FACTORS: Profession and 1 comorbidity: none   are also affecting patient's functional outcome.   REHAB POTENTIAL: Excellent  CLINICAL DECISION MAKING: Stable/uncomplicated  EVALUATION COMPLEXITY: Low   GOALS: Goals reviewed with patient? Yes  SHORT TERM GOALS: Target date: 05/11/2023   Pt will be I with HEP for L hip ROM and strength  Baseline: 06/13/23:  tis completing therex/HEP correctly Goal status: MET  2.  Patient will be able to transfer sit to supine without the need for hands to lift leg.  Baseline: L hip weakness, needs A . Update: he can do this  sometimes, still a strain Goal status: ongoing   3.  Patient will be able to perform sit to stand without use of hands x 10 Baseline: unable . Update : done off standard mat table with AIREX under hips  Goal status: MET  4.  Pt will be able to walk with a cane in the clinic without cues for sequencing Baseline: needs cues  Goal status: MET   5.  Pt will be screened for balance deficits and goal set  Baseline: Unable to do this on evaluation Goal status: MET    LONG TERM GOALS: Target date: 07/06/2023    Patient will be independent with home program at the end of therapy Baseline: Unknown Goal status: ongoing   2.  Foto score will improve to 66% to demonstrate functional mobility increase Baseline: 40% 05/25/23: 58% Goal status: ongoing   3.  Patient will be able to walk to the grocery store with min  increase in fatigue, pain for 30- 45 min  Baseline: limited by fatigue and pain, 20-30 min  Goal status: ongoing   4.  Pt will demo 5/5 strength in L hip for optimal gait and mobility  Baseline: 4-/5 05/25/23 4/5 hip flex and glutes 3+/5 Goal status: ongoing   5.  Patient will report no difficulty with car/bed transfers Baseline: needs A from hands 10/17 improving , min occ difficulty  Goal status: ongoing   6.  Pt will be able to complete 2 min walk test without cane, 350 feet  Baseline: 297 with cane . 10/17 334 without cane  Goal status: ongoing   PLAN:   PT FREQUENCY: 2x/week  PT DURATION: 8 weeks  PLANNED INTERVENTIONS: Therapeutic exercises, Therapeutic activity, Neuromuscular re-education, Balance training, Gait training, Patient/Family education, Self Care, Joint mobilization, Stair training, DME instructions, Spinal mobilization, Cryotherapy, Moist heat, Manual therapy, and Re-evaluation  PLAN FOR NEXT SESSION: gait, endurance, standing: step ups, hip abd and ext strength , add banded hip ex.   Kenzleigh Sedam MS, PT 06/13/23 11:31 AM

## 2023-06-14 NOTE — Therapy (Unsigned)
OUTPATIENT PHYSICAL THERAPY TREATMENT THERAPY NOTE    Patient Name: [name] MRN: [id number] DOB:16-Jul-1961, 62 y.o., male Today's Date: 06/15/2023  END OF SESSION:  PT End of Session - 06/15/23 0938     Visit Number 12    Number of Visits 16    Date for PT Re-Evaluation 07/06/23    Authorization Type [place] [employer]    Authorization - Number of Visits 27    PT Start Time 0934    PT Stop Time 1019    PT Time Calculation (min) 45 min    Activity Tolerance Patient tolerated treatment well    Behavior During Therapy WFL for tasks assessed/performed                       Past Medical History:  Diagnosis Date   Chest pain    stres test neg x 2, cath 5/12; minimal LAD irregs; no obs CAD; normal LVF   Clotting disorder (HCC)    dvt   GERD (gastroesophageal reflux disease)    History of DVT of lower extremity    on chronic coumadin   Hypertension    Internal hemorrhoids    Cscope 2007   PAD (peripheral artery disease) (HCC)    a. left leg ischemia tx wtih embolectomy of left fem, pop, tib arteries with Dr. Edilia Bo - 08/2006;  b. known occlusion of right pop with collats - med Rx;  c.ABI's 8/09: R 1.0; L 0.91    PFO (patent foramen ovale)    per 08/2006 discharge summary, TEE showed EF 60%, small PFO with minimal right-to-left shunt with Valsalva; not felt to be the source of emboli, lifelong Coumadin recommended   Pneumonia    history of   Polysubstance abuse (HCC)    marijuana only   Renal infarct [facility])    R   Past Surgical History:  Procedure Laterality Date   CHOLECYSTECTOMY     COLONOSCOPY     INGUINAL HERNIA REPAIR Left 07/16/2019   Procedure: LAPAROSCOPIC LEFT INGUINAL HERNIA REPAIR WITH MESH;  Surgeon: Axel Filler, MD;  Location: [facility] OR;  Service: General;  Laterality: Left;   left leg blood clot removal 2009  2009   TONSILLECTOMY     TOTAL HIP ARTHROPLASTY Left 02/03/2023   Procedure: LEFT TOTAL HIP ARTHROPLASTY ANTERIOR APPROACH;  Surgeon:  Tarry Kos, MD;  Location: [facility] OR;  Service: Orthopedics;  Laterality: Left;  3-C   WRIST SURGERY Left    Patient Active Problem List   Diagnosis Date Noted   Status post total replacement of left hip 02/03/2023   Primary osteoarthritis of left hip 02/02/2023   Avascular necrosis of bone of left hip (HCC) 12/23/2022   High risk heterosexual behavior 02/05/2021   History of colonic polyps 11/04/2019   PCP NOTES >>>>>>>>>>>>>> 07/26/2016   GERD (gastroesophageal reflux disease) 07/26/2016   Annual physical exam 07/25/2016   HTN (hypertension) 09/16/2014   Nausea without vomiting 11/29/2013   Deep vein thrombosis (HCC) 10/06/2010   Long term current use of anticoagulant 10/06/2010   Renal infarct (HCC) 10/06/2010   Dyslipidemia 09/03/2009   CHEST PAIN-UNSPECIFIED 09/03/2009   SHOULDER PAIN, RIGHT 03/02/2009   PERIPHERAL VASCULAR DISEASE 02/11/2009   SKIN LESION 02/04/2008   Polysubstance abuse (HCC) 05/28/2007   DISORDER, VASCULAR, KIDNEY 05/28/2007   DVT, HX OF 12/25/2006    PCP: Wanda Plump MD   REFERRING PROVIDER: Gershon Mussel  REFERRING DIAG: [phone] (ICD-10-CM) - Status post total  replacement of left hip  THERAPY DIAG:  S/P total left hip arthroplasty  Pain in left hip  Stiffness of left hip, not elsewhere classified  Difficulty in walking, not elsewhere classified  Rationale for Evaluation and Treatment: Rehabilitation  ONSET DATE: 02/03/23  SUBJECTIVE:   SUBJECTIVE STATEMENT: Patient reports no pain currently on the left hip.  He feels like imbalance is limiting him and his lack of confidence.  He continues to think about returning to work not sure if he will be able to but he is hopeful.  He reports increased pain and weakness after therapy.  He reports doing his home program not daily but most days of the week.  Still hard to lift his leg up from sitting or lying down or transferring PERTINENT HISTORY: AVN and OA L knee H/o DVT THA 02/03/23   PAIN:  Are  you having pain? Yes: NPRS scale: 1/10 Pain location: Lt hip Pain description: tired, sore  Aggravating factors: standing, walking  Relieving factors: rest, has some pain meds  With standing long periods, leg gets tired and pain is 4/10  PRECAUTIONS: None  RED FLAGS: None   WEIGHT BEARING RESTRICTIONS: No  FALLS:  Has patient fallen in last 6 months? Yes. Number of falls 3  LIVING ENVIRONMENT: Lives with: lives with their spouse Lives in: House/apartment Stairs: Yes: External: 3-4 steps; can reach bothhas ramp  Has following equipment at home: Quad cane large base and Walker - 2 wheeled  OCCUPATION: [employer]; fork lift stand up   PLOF: Independent with basic ADLs, Independent with household mobility with device, Independent with community mobility with device, Needs assistance with ADLs, Vocation/Vocational requirements: drives forklift , and Leisure: likes to fish, watch TV   PATIENT GOALS: Walk, resume mobilty   NEXT MD VISIT: 6 weeks   OBJECTIVE:   PATIENT SURVEYS:  FOTO 40% 05/25/23: 55%   SENSATION: Light touch: Impaired L thigh   EDEMA:  Min per patient   MUSCLE LENGTH: NT   POSTURE: rounded shoulders, forward head, anterior pelvic tilt, left pelvic obliquity, and flexed trunk   PALPATION: Sore to palpation along L thigh, numb proximally along incision   LOWER EXTREMITY ROM:  Active ROM Right eval Left eval Lt.  05/18/23   Hip flexion  110 110  Hip extension     Hip abduction     Hip adduction     Hip internal rotation  15 20  Hip external rotation  25 30  Knee flexion  130    Knee extension  0   Ankle dorsiflexion     Ankle plantarflexion     Ankle inversion     Ankle eversion      (Blank rows = not tested)  LOWER EXTREMITY MMT:  MMT Right eval Left eval Left 04/27/2022 Lt.  05/25/23  Hip flexion 4+ 4- 4- 4  Hip extension      Hip abduction   3 3+  Hip adduction      Hip internal rotation      Hip external rotation      Knee  flexion 5 5  5   Knee extension 5 4+ 4+ 5  Ankle dorsiflexion 5 5  5   Ankle plantarflexion      Ankle inversion      Ankle eversion       (Blank rows = not tested)  LOWER EXTREMITY SPECIAL TESTS:  NT  FUNCTIONAL TESTS:  5 times sit to stand: 27 sec wth hands, unable to push  up without UEs for 1st 3 tries   5 X STS: 19 sec  GAIT: Distance walked: 100 Assistive device utilized: Quad cane large base Level of assistance: SBA Comments: mild LOB but recovered, turning head makes him unsteady   TODAY'S TREATMENT:  [facility] Adult PT Treatment:                                                DATE: 06/15/23 Therapeutic Exercise: NuStep L 5 UE and LE for 6 min  Supine hip flexor stretch red loop x 10 with PT assist then blue band on thighs  Bridge blue band x 10  Bridge with clam x 15 blue band Hip ER stretch (knee crossed/push/pull L hip flexor stretch  Sit to stand 10 lbs Squat 10 lbs x 10 to Airex  Lateral mini-lunge blue band with and without UE support  Manual Therapy: LAD  Passive L knee flexion in Thomas test (mod) position    [facility] Adult PT Treatment:                                                DATE: 06/13/23 Therapeutic Exercise: Nu Step 5 min, level 4 LE  LAQ 2x10 4# Marching 2x15 each on airex Lateral step ups 6" 2x10 STS chair c airex 2x10 Heel raises x20      [facility] Adult PT Treatment:                                                DATE: 06/09/23 Therapeutic Exercise: Nu Step 5 min, level 4 LE  Knee to chest  Hip ER (foot on opp thigh) Marching x15 each SLR x 10 LLE PT  Banded hip ER/abd GTB x10 Banded bridge x 10 BluTB STS elevated mat table x10 LAQ x10 3# Heel raises x10   [facility] Adult PT Treatment:                                                DATE: 06/02/23 Therapeutic Exercise: Nu Step 5 min, level 6  Knee to chest  Hip ER (foot on opp thigh) Marching x10 each SLR x 10 LLE with PT assist Banded hip ER/abd cues to slow in supine Banded bridge x 10   Sidelying clam green x 15  STS elevated mat table 2x10 Hip abd 3 lbs 2x10 each standing  Heel raises  [facility] Adult PT Treatment:                                                DATE: 05/25/23 Therapeutic Exercise: Nu Step 5 min, level 6  Wide knees hip rotation  Knee to chest  Hip ER (foot on opp thigh) SLR x 10 LLE with PT assist Banded hip ER/abd cues to slow in supine x15 Banded bridge x 12 Hip extension  3 lbs x 15 standing  Heel raises SLS < 10 sec each LE     PATIENT EDUCATION:  Education details: HEP Person educated: Patient and Child(ren) Education method: Explanation, Demonstration, Verbal cues, and Handouts Education comprehension: verbalized understanding, returned demonstration, and needs further education  HOME EXERCISE PROGRAM: Access Code: [email or web address] URL: [email or web address] Date: 04/13/2023 Prepared by: Karie Mainland  Exercises - Supine Bridge  - 1 x daily - 7 x weekly - 2 sets - 10 reps - 5 hold - Beginner Side Leg Lift  - 1 x daily - 7 x weekly - 2 sets - 10 reps - 5 hold - Hooklying Straight Leg Raise  - 1 x daily - 7 x weekly - 2 sets - 10 reps - 5 hold - Hooklying Clamshell with Resistance  - 1 x daily - 7 x weekly - 2 sets - 10 reps - 5 hold - Supine Hip Internal and External Rotation  - 1 x daily - 7 x weekly - 2 sets - 10 reps - 10 hold -see medbridge    ASSESSMENT:  CLINICAL IMPRESSION: Patient continues to benefit from skilled physical therapy for lower extremity strength and hip mobility.  Patient has improved left-sided hip flexion but still with great effort and some degree of pain.  He was better able to cross his left foot onto his right knee today which can translate to improved lower body dressing.  I asked him to stay consistent with his home program and use a cane when walking community longer distances.  Without the cane he is safe but does show a significant limp and decreased gait velocity.   OBJECTIVE IMPAIRMENTS:  Abnormal gait, decreased activity tolerance, decreased balance, decreased knowledge of use of DME, decreased mobility, difficulty walking, decreased ROM, decreased strength, increased fascial restrictions, impaired flexibility, postural dysfunction, and pain.   ACTIVITY LIMITATIONS: carrying, lifting, bending, standing, squatting, sleeping, stairs, transfers, bed mobility, dressing, and locomotion level  PARTICIPATION LIMITATIONS: cleaning, interpersonal relationship, driving, shopping, community activity, occupation, and yard work  PERSONAL FACTORS: Profession and 1 comorbidity: none   are also affecting patient's functional outcome.   REHAB POTENTIAL: Excellent  CLINICAL DECISION MAKING: Stable/uncomplicated  EVALUATION COMPLEXITY: Low   GOALS: Goals reviewed with patient? Yes  SHORT TERM GOALS: Target date: 05/11/2023   Pt will be I with HEP for L hip ROM and strength  Baseline: 06/13/23:  tis completing therex/HEP correctly Goal status: MET  2.  Patient will be able to transfer sit to supine without the need for hands to lift leg.  Baseline: L hip weakness, needs A . Update: he can do this sometimes, still a strain Goal status: ongoing   3.  Patient will be able to perform sit to stand without use of hands x 10 Baseline: unable . Update : done off standard mat table with AIREX under hips  Goal status: MET  4.  Pt will be able to walk with a cane in the clinic without cues for sequencing Baseline: needs cues  Goal status: MET   5.  Pt will be screened for balance deficits and goal set  Baseline: Unable to do this on evaluation Goal status: MET    LONG TERM GOALS: Target date: 07/06/2023    Patient will be independent with home program at the end of therapy Baseline: Unknown Goal status: ongoing   2.  Foto score will improve to 66% to demonstrate functional mobility increase Baseline: 40% 05/25/23: 58% Goal status: ongoing   3.  Patient will be able to walk to the  grocery store with min  increase in fatigue, pain for 30- 45 min  Baseline: limited by fatigue and pain, 20-30 min  Goal status: ongoing   4.  Pt will demo 5/5 strength in L hip for optimal gait and mobility  Baseline: 4-/5 05/25/23 4/5 hip flex and glutes 3+/5 Goal status: ongoing   5.  Patient will report no difficulty with car/bed transfers Baseline: needs A from hands 10/17 improving , min occ difficulty  Goal status: ongoing   6.  Pt will be able to complete 2 min walk test without cane, 350 feet  Baseline: 297 with cane . 10/17 334 without cane  Goal status: ongoing   PLAN:   PT FREQUENCY: 2x/week  PT DURATION: 8 weeks  PLANNED INTERVENTIONS: Therapeutic exercises, Therapeutic activity, Neuromuscular re-education, Balance training, Gait training, Patient/Family education, Self Care, Joint mobilization, Stair training, DME instructions, Spinal mobilization, Cryotherapy, Moist heat, Manual therapy, and Re-evaluation  PLAN FOR NEXT SESSION: gait, endurance, standing: step ups, hip abd and ext strength , add banded hip ex.  Karie Mainland, PT 06/15/23 1:01 PM Phone: [phone] Fax: [phone]

## 2023-06-15 ENCOUNTER — Encounter: Payer: Self-pay | Admitting: Physical Therapy

## 2023-06-15 ENCOUNTER — Ambulatory Visit: Payer: 59 | Admitting: Physical Therapy

## 2023-06-15 DIAGNOSIS — M25652 Stiffness of left hip, not elsewhere classified: Secondary | ICD-10-CM

## 2023-06-15 DIAGNOSIS — R262 Difficulty in walking, not elsewhere classified: Secondary | ICD-10-CM | POA: Diagnosis not present

## 2023-06-15 DIAGNOSIS — M25552 Pain in left hip: Secondary | ICD-10-CM

## 2023-06-15 DIAGNOSIS — Z96642 Presence of left artificial hip joint: Secondary | ICD-10-CM | POA: Diagnosis not present

## 2023-06-16 ENCOUNTER — Telehealth: Payer: Self-pay | Admitting: Orthopaedic Surgery

## 2023-06-16 NOTE — Telephone Encounter (Signed)
Patient called wanted to know when should come back to see hip. CB#[phone]

## 2023-06-21 ENCOUNTER — Encounter: Payer: Self-pay | Admitting: Physical Therapy

## 2023-06-21 ENCOUNTER — Ambulatory Visit: Payer: Medicaid Other | Admitting: Physical Therapy

## 2023-06-21 DIAGNOSIS — M25552 Pain in left hip: Secondary | ICD-10-CM

## 2023-06-21 DIAGNOSIS — R262 Difficulty in walking, not elsewhere classified: Secondary | ICD-10-CM | POA: Diagnosis not present

## 2023-06-21 DIAGNOSIS — Z96642 Presence of left artificial hip joint: Secondary | ICD-10-CM

## 2023-06-21 DIAGNOSIS — M25652 Stiffness of left hip, not elsewhere classified: Secondary | ICD-10-CM | POA: Diagnosis not present

## 2023-06-21 NOTE — Therapy (Signed)
OUTPATIENT PHYSICAL THERAPY TREATMENT THERAPY NOTE    Patient Name: [name] MRN: [id number] DOB:03-28-1961, 62 y.o., male Today's Date: 06/21/2023  END OF SESSION:  PT End of Session - 06/21/23 0931     Visit Number 13    Number of Visits 16    Date for PT Re-Evaluation 07/06/23    Authorization Type [place] [employer]    PT Start Time 0930    PT Stop Time 1015    PT Time Calculation (min) 45 min                       Past Medical History:  Diagnosis Date   Chest pain    stres test neg x 2, cath 5/12; minimal LAD irregs; no obs CAD; normal LVF   Clotting disorder (HCC)    dvt   GERD (gastroesophageal reflux disease)    History of DVT of lower extremity    on chronic coumadin   Hypertension    Internal hemorrhoids    Cscope 2007   PAD (peripheral artery disease) (HCC)    a. left leg ischemia tx wtih embolectomy of left fem, pop, tib arteries with Dr. Edilia Bo - 08/2006;  b. known occlusion of right pop with collats - med Rx;  c.ABI's 8/09: R 1.0; L 0.91    PFO (patent foramen ovale)    per 08/2006 discharge summary, TEE showed EF 60%, small PFO with minimal right-to-left shunt with Valsalva; not felt to be the source of emboli, lifelong Coumadin recommended   Pneumonia    history of   Polysubstance abuse (HCC)    marijuana only   Renal infarct [facility])    R   Past Surgical History:  Procedure Laterality Date   CHOLECYSTECTOMY     COLONOSCOPY     INGUINAL HERNIA REPAIR Left 07/16/2019   Procedure: LAPAROSCOPIC LEFT INGUINAL HERNIA REPAIR WITH MESH;  Surgeon: Axel Filler, MD;  Location: [facility] OR;  Service: General;  Laterality: Left;   left leg blood clot removal 2009  2009   TONSILLECTOMY     TOTAL HIP ARTHROPLASTY Left 02/03/2023   Procedure: LEFT TOTAL HIP ARTHROPLASTY ANTERIOR APPROACH;  Surgeon: Tarry Kos, MD;  Location: [facility] OR;  Service: Orthopedics;  Laterality: Left;  3-C   WRIST SURGERY Left    Patient Active Problem List   Diagnosis Date  Noted   Status post total replacement of left hip 02/03/2023   Primary osteoarthritis of left hip 02/02/2023   Avascular necrosis of bone of left hip (HCC) 12/23/2022   High risk heterosexual behavior 02/05/2021   History of colonic polyps 11/04/2019   PCP NOTES >>>>>>>>>>>>>> 07/26/2016   GERD (gastroesophageal reflux disease) 07/26/2016   Annual physical exam 07/25/2016   HTN (hypertension) 09/16/2014   Nausea without vomiting 11/29/2013   Deep vein thrombosis (HCC) 10/06/2010   Long term current use of anticoagulant 10/06/2010   Renal infarct (HCC) 10/06/2010   Dyslipidemia 09/03/2009   CHEST PAIN-UNSPECIFIED 09/03/2009   SHOULDER PAIN, RIGHT 03/02/2009   PERIPHERAL VASCULAR DISEASE 02/11/2009   SKIN LESION 02/04/2008   Polysubstance abuse (HCC) 05/28/2007   DISORDER, VASCULAR, KIDNEY 05/28/2007   DVT, HX OF 12/25/2006    PCP: Wanda Plump MD   REFERRING PROVIDER: Gershon Mussel  REFERRING DIAG: [id number] (ICD-10-CM) - Status post total replacement of left hip  THERAPY DIAG:  S/P total left hip arthroplasty  Pain in left hip  Rationale for Evaluation and Treatment: Rehabilitation  ONSET DATE:  02/03/23  SUBJECTIVE:   SUBJECTIVE STATEMENT: No pain on arrival. Will see Dr Roda Shutters this week in order to update long term disability insurance. Does not feel ready to return to previous job. 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   PERTINENT HISTORY: AVN and OA L knee H/o DVT THA 02/03/23   PAIN:  Are you having pain? Yes: NPRS scale: 1/10 Pain location: Lt hip Pain description: tired, sore  Aggravating factors: standing, walking  Relieving factors: rest, has some pain meds  With standing long periods, leg gets tired and pain is 4/10  PRECAUTIONS: None  RED FLAGS: None   WEIGHT BEARING RESTRICTIONS: No  FALLS:  Has patient fallen in last 6 months? Yes. Number of falls 3  LIVING ENVIRONMENT: Lives with: lives with their spouse Lives in: House/apartment Stairs:  Yes: External: 3-4 steps; can reach bothhas ramp  Has following equipment at home: Quad cane large base and Walker - 2 wheeled  OCCUPATION: [employer]; fork lift stand up   PLOF: Independent with basic ADLs, Independent with household mobility with device, Independent with community mobility with device, Needs assistance with ADLs, Vocation/Vocational requirements: drives forklift , and Leisure: likes to fish, watch TV   PATIENT GOALS: Walk, resume mobilty   NEXT MD VISIT: 6 weeks   OBJECTIVE:   PATIENT SURVEYS:  FOTO 40% 05/25/23: 55%   SENSATION: Light touch: Impaired L thigh   EDEMA:  Min per patient   MUSCLE LENGTH: NT   POSTURE: rounded shoulders, forward head, anterior pelvic tilt, left pelvic obliquity, and flexed trunk   PALPATION: Sore to palpation along L thigh, numb proximally along incision   LOWER EXTREMITY ROM:  Active ROM Right eval Left eval Lt.  05/18/23  LT 06/21/23  Hip flexion  110 110   Hip extension      Hip abduction      Hip adduction      Hip internal rotation  15 20 25   Hip external rotation  25 30 50  Knee flexion  130     Knee extension  0    Ankle dorsiflexion      Ankle plantarflexion      Ankle inversion      Ankle eversion       (Blank rows = not tested)  LOWER EXTREMITY MMT:  MMT Right eval Left eval Left 04/27/2022 Lt.  05/25/23 Lt 06/21/23  Hip flexion 4+ 4- 4- 4   Hip extension       Hip abduction   3 3+ 4-  Hip adduction       Hip internal rotation       Hip external rotation       Knee flexion 5 5  5    Knee extension 5 4+ 4+ 5   Ankle dorsiflexion 5 5  5    Ankle plantarflexion       Ankle inversion       Ankle eversion        (Blank rows = not tested)  LOWER EXTREMITY SPECIAL TESTS:  NT  FUNCTIONAL TESTS:  5 times sit to stand: 27 sec wth hands, unable to push up without UEs for 1st 3 tries   5 X STS: 19 sec  06/21/23: 2 MWT: 415 feet    GAIT: Distance walked: 100 Assistive device utilized: Quad cane  large base Level of assistance: SBA Comments: mild LOB but recovered, turning head makes him unsteady   TODAY'S TREATMENT: [facility] Adult PT Treatment:  DATE: 06/21/23 Therapeutic Exercise: Nustep, level 5 , LE x 5 minutes  Supine Hip piriformis stretch left S/L hip abduction x 10, hip abducted with  hip flexion and extension x 6 S/L clam x 10  Supine banded bridge green  Supine march green Supine clam green  Therapeutic Activity: 415 feet 2 MWT FOTO decreased    [facility] Adult PT Treatment:                                                DATE: 06/15/23 Therapeutic Exercise: NuStep L 5 UE and LE for 6 min  Supine hip flexor stretch red loop x 10 with PT assist then blue band on thighs  Bridge blue band x 10  Bridge with clam x 15 blue band Hip ER stretch (knee crossed/push/pull L hip flexor stretch  Sit to stand 10 lbs Squat 10 lbs x 10 to Airex  Lateral mini-lunge blue band with and without UE support  Manual Therapy: LAD  Passive L knee flexion in Thomas test (mod) position    [facility] Adult PT Treatment:                                                DATE: 06/13/23 Therapeutic Exercise: Nu Step 5 min, level 4 LE  LAQ 2x10 4# Marching 2x15 each on airex Lateral step ups 6" 2x10 STS chair c airex 2x10 Heel raises x20      [facility] Adult PT Treatment:                                                DATE: 06/09/23 Therapeutic Exercise: Nu Step 5 min, level 4 LE  Knee to chest  Hip ER (foot on opp thigh) Marching x15 each SLR x 10 LLE PT  Banded hip ER/abd GTB x10 Banded bridge x 10 BluTB STS elevated mat table x10 LAQ x10 3# Heel raises x10   [facility] Adult PT Treatment:                                                DATE: 06/02/23 Therapeutic Exercise: Nu Step 5 min, level 6  Knee to chest  Hip ER (foot on opp thigh) Marching x10 each SLR x 10 LLE with PT assist Banded hip ER/abd cues to slow in supine Banded bridge x 10   Sidelying clam green x 15  STS elevated mat table 2x10 Hip abd 3 lbs 2x10 each standing  Heel raises  [facility] Adult PT Treatment:                                                DATE: 05/25/23 Therapeutic Exercise: Nu Step 5 min, level 6  Wide knees hip rotation  Knee to chest  Hip ER (foot on opp thigh) SLR x 10 LLE with PT  assist Banded hip ER/abd cues to slow in supine x15 Banded bridge x 12 Hip extension 3 lbs x 15 standing  Heel raises SLS < 10 sec each LE     PATIENT EDUCATION:  Education details: HEP Person educated: Patient and Child(ren) Education method: Programmer, multimedia, Demonstration, Verbal cues, and Handouts Education comprehension: verbalized understanding, returned demonstration, and needs further education  HOME EXERCISE PROGRAM: Access Code: [email or web address] URL: [email or web address] Date: 04/13/2023 Prepared by: Karie Mainland  Exercises - Supine Bridge  - 1 x daily - 7 x weekly - 2 sets - 10 reps - 5 hold - Beginner Side Leg Lift  - 1 x daily - 7 x weekly - 2 sets - 10 reps - 5 hold - Hooklying Straight Leg Raise  - 1 x daily - 7 x weekly - 2 sets - 10 reps - 5 hold - Hooklying Clamshell with Resistance  - 1 x daily - 7 x weekly - 2 sets - 10 reps - 5 hold - Supine Hip Internal and External Rotation  - 1 x daily - 7 x weekly - 2 sets - 10 reps - 10 hold -see medbridge    ASSESSMENT:  CLINICAL IMPRESSION: Hip abduction strength and hip rotation ROM improved. No longer requires assist from UE to lift leg during transitions to mat/bed, still needs assist with lower cars. 2 MWT distance improved. Can complete 30 minute shopping trip without min difficulty. All STGS and LTG# 3 met. Making good progress toward remaining goals. Sees Dr Roda Shutters this week to check progress toward RTW. His disability insurance has requested a update on his status..     OBJECTIVE IMPAIRMENTS: Abnormal gait, decreased activity tolerance, decreased balance, decreased knowledge of use of  DME, decreased mobility, difficulty walking, decreased ROM, decreased strength, increased fascial restrictions, impaired flexibility, postural dysfunction, and pain.   ACTIVITY LIMITATIONS: carrying, lifting, bending, standing, squatting, sleeping, stairs, transfers, bed mobility, dressing, and locomotion level  PARTICIPATION LIMITATIONS: cleaning, interpersonal relationship, driving, shopping, community activity, occupation, and yard work  PERSONAL FACTORS: Profession and 1 comorbidity: none   are also affecting patient's functional outcome.   REHAB POTENTIAL: Excellent  CLINICAL DECISION MAKING: Stable/uncomplicated  EVALUATION COMPLEXITY: Low   GOALS: Goals reviewed with patient? Yes  SHORT TERM GOALS: Target date: 05/11/2023   Pt will be I with HEP for L hip ROM and strength  Baseline: 06/13/23:  tis completing therex/HEP correctly Goal status: MET  2.  Patient will be able to transfer sit to supine without the need for hands to lift leg.  Baseline: L hip weakness, needs A . Update: he can do this sometimes, still a strain Goal status: MET  3.  Patient will be able to perform sit to stand without use of hands x 10 Baseline: unable . Update : done off standard mat table with AIREX under hips  Goal status: MET  4.  Pt will be able to walk with a cane in the clinic without cues for sequencing Baseline: needs cues  Goal status: MET   5.  Pt will be screened for balance deficits and goal set  Baseline: Unable to do this on evaluation Goal status: MET    LONG TERM GOALS: Target date: 07/06/2023    Patient will be independent with home program at the end of therapy Baseline: Unknown Goal status: ongoing   2.  Foto score will improve to 66% to demonstrate functional mobility increase Baseline: 40% 05/25/23: 58% 06/21/23:  Goal status: ongoing   3.  Patient  will be able to walk to the grocery store with min  increase in fatigue, pain for 30- 45 min  Baseline: limited  by fatigue and pain, 20-30 min  06/21/23: can do 30 min without difficulty.  Goal status: MET   4.  Pt will demo 5/5 strength in L hip for optimal gait and mobility  Baseline: 4-/5 05/25/23 4/5 hip flex and glutes 3+/5 06/21/23: 4- hip abdct  Goal status: ongoing   5.  Patient will report no difficulty with car/bed transfers Baseline: needs A from hands 10/17 improving , min occ difficulty  06/21/23: Lower cars are still difficulty  Goal status: ongoing, improved   6.  Pt will be able to complete 2 min walk test without cane, 350 feet  Baseline: 297 with cane . 10/17 334 without cane  06/21/23: 415 feet  Goal status: MET   PLAN:   PT FREQUENCY: 2x/week  PT DURATION: 8 weeks  PLANNED INTERVENTIONS: Therapeutic exercises, Therapeutic activity, Neuromuscular re-education, Balance training, Gait training, Patient/Family education, Self Care, Joint mobilization, Stair training, DME instructions, Spinal mobilization, Cryotherapy, Moist heat, Manual therapy, and Re-evaluation  PLAN FOR NEXT SESSION: gait, endurance, standing: step ups, hip abd and ext strength , add banded hip ex.  Jannette Spanner, PTA 06/21/23 11:38 AM Phone: [phone] Fax: [phone]

## 2023-06-23 ENCOUNTER — Ambulatory Visit (INDEPENDENT_AMBULATORY_CARE_PROVIDER_SITE_OTHER): Payer: 59 | Admitting: Orthopaedic Surgery

## 2023-06-23 ENCOUNTER — Other Ambulatory Visit (INDEPENDENT_AMBULATORY_CARE_PROVIDER_SITE_OTHER): Payer: 59

## 2023-06-23 DIAGNOSIS — Z96642 Presence of left artificial hip joint: Secondary | ICD-10-CM

## 2023-06-23 NOTE — Progress Notes (Signed)
Office Visit Note   Patient: [name]           Date of Birth: 01-Jan-1961           MRN: [id number] Visit Date: 06/23/2023              Requested by: Wanda Plump, MD [address] [place],  [place] [address] PCP: Wanda Plump, MD   Assessment & Plan: Visit Diagnoses:  1. Status post total replacement of left hip     Plan: For my standpoint the patient has done well from the surgery.  He does not have any formal restrictions from my standpoint.  He is released to work from my standpoint.  Will see him back in 6 months with repeat x-rays.  Follow-Up Instructions: Return in about 6 months (around 12/21/2023).   Orders:  Orders Placed This Encounter  Procedures   XR HIP UNILAT W OR W/O PELVIS 2-3 VIEWS LEFT   No orders of the defined types were placed in this encounter.     Procedures: No procedures performed   Clinical Data: No additional findings.   Subjective: Chief Complaint  Patient presents with   Left Hip - Follow-up    HPI Patient is approximately 5 months status post left total hip replacement.  He is following up today to inquire about his restrictions for work.  He is currently still out of work and seeking long-term disability.  He is doing outpatient physical therapy.  He states that he still working on strength and balance. Review of Systems  Constitutional: Negative.   HENT: Negative.    Eyes: Negative.   Respiratory: Negative.    Cardiovascular: Negative.   Gastrointestinal: Negative.   Endocrine: Negative.   Genitourinary: Negative.   Skin: Negative.   Allergic/Immunologic: Negative.   Neurological: Negative.   Hematological: Negative.   Psychiatric/Behavioral: Negative.    All other systems reviewed and are negative.    Objective: Vital Signs: There were no vitals taken for this visit.  Physical Exam Vitals and nursing note reviewed.  Constitutional:      Appearance: He is well-developed.  Pulmonary:      Effort: Pulmonary effort is normal.  Abdominal:     Palpations: Abdomen is soft.  Skin:    General: Skin is warm.  Neurological:     Mental Status: He is alert and oriented to person, place, and time.  Psychiatric:        Behavior: Behavior normal.        Thought Content: Thought content normal.        Judgment: Judgment normal.     Ortho Exam Exam of the left hip shows fully healed surgical scar.  He has painless range of motion.  Leg lengths are equal.  His gait pattern is basically normal.  Strength to hip flexion is about 4 out of 5. Specialty Comments:  No specialty comments available.  Imaging: XR HIP UNILAT W OR W/O PELVIS 2-3 VIEWS LEFT  Result Date: 06/23/2023 Stable left total hip replacement without complication    PMFS History: Patient Active Problem List   Diagnosis Date Noted   Status post total replacement of left hip 02/03/2023   Primary osteoarthritis of left hip 02/02/2023   Avascular necrosis of bone of left hip (HCC) 12/23/2022   High risk heterosexual behavior 02/05/2021   History of colonic polyps 11/04/2019   PCP NOTES >>>>>>>>>>>>>> 07/26/2016   GERD (gastroesophageal reflux disease) 07/26/2016  Annual physical exam 07/25/2016   HTN (hypertension) 09/16/2014   Nausea without vomiting 11/29/2013   Deep vein thrombosis (HCC) 10/06/2010   Long term current use of anticoagulant 10/06/2010   Renal infarct (HCC) 10/06/2010   Dyslipidemia 09/03/2009   CHEST PAIN-UNSPECIFIED 09/03/2009   SHOULDER PAIN, RIGHT 03/02/2009   PERIPHERAL VASCULAR DISEASE 02/11/2009   SKIN LESION 02/04/2008   Polysubstance abuse (HCC) 05/28/2007   DISORDER, VASCULAR, KIDNEY 05/28/2007   DVT, HX OF 12/25/2006   Past Medical History:  Diagnosis Date   Chest pain    stres test neg x 2, cath 5/12; minimal LAD irregs; no obs CAD; normal LVF   Clotting disorder (HCC)    dvt   GERD (gastroesophageal reflux disease)    History of DVT of lower extremity    on chronic  coumadin   Hypertension    Internal hemorrhoids    Cscope 2007   PAD (peripheral artery disease) (HCC)    a. left leg ischemia tx wtih embolectomy of left fem, pop, tib arteries with Dr. Edilia Bo - 08/2006;  b. known occlusion of right pop with collats - med Rx;  c.ABI's 8/09: R 1.0; L 0.91    PFO (patent foramen ovale)    per 08/2006 discharge summary, TEE showed EF 60%, small PFO with minimal right-to-left shunt with Valsalva; not felt to be the source of emboli, lifelong Coumadin recommended   Pneumonia    history of   Polysubstance abuse (HCC)    marijuana only   Renal infarct (HCC)    R    Family History  Problem Relation Age of Onset   Hypertension Mother    Breast cancer Mother    Hypertension Father    CAD Father    Diabetes Father    Lung cancer Maternal Uncle    Pancreatic cancer Brother    Prostate cancer Maternal Uncle    Heart disease Paternal Grandmother    Colon cancer Neg Hx    Stomach cancer Neg Hx    Esophageal cancer Neg Hx    Rectal cancer Neg Hx     Past Surgical History:  Procedure Laterality Date   CHOLECYSTECTOMY     COLONOSCOPY     INGUINAL HERNIA REPAIR Left 07/16/2019   Procedure: LAPAROSCOPIC LEFT INGUINAL HERNIA REPAIR WITH MESH;  Surgeon: Axel Filler, MD;  Location: [facility] OR;  Service: General;  Laterality: Left;   left leg blood clot removal 2009  2009   TONSILLECTOMY     TOTAL HIP ARTHROPLASTY Left 02/03/2023   Procedure: LEFT TOTAL HIP ARTHROPLASTY ANTERIOR APPROACH;  Surgeon: Tarry Kos, MD;  Location: [facility] OR;  Service: Orthopedics;  Laterality: Left;  3-C   WRIST SURGERY Left    Social History   Occupational History   Occupation: [employer], driver   Tobacco Use   Smoking status: Every Day    Current packs/day: 1.00    Average packs/day: 1 pack/day for 30.0 years (30.0 ttl pk-yrs)    Types: Cigarettes   Smokeless tobacco: Never   Tobacco comments:    1 to 1.5 ppd   Vaping Use   Vaping status: Never Used  Substance and Sexual  Activity   Alcohol use: Yes    Alcohol/week: 21.0 standard drinks of alcohol    Types: 21 Standard drinks or equivalent per week    Comment: 3 drinks per day, every day   Drug use: Yes    Types: Marijuana    Comment: smokes every day   Sexual activity:  Yes    Partners: Female

## 2023-06-29 ENCOUNTER — Ambulatory Visit: Payer: 59 | Admitting: Physical Therapy

## 2023-06-29 ENCOUNTER — Encounter: Payer: Self-pay | Admitting: Physical Therapy

## 2023-06-29 DIAGNOSIS — R262 Difficulty in walking, not elsewhere classified: Secondary | ICD-10-CM | POA: Diagnosis not present

## 2023-06-29 DIAGNOSIS — Z96642 Presence of left artificial hip joint: Secondary | ICD-10-CM

## 2023-06-29 DIAGNOSIS — M25652 Stiffness of left hip, not elsewhere classified: Secondary | ICD-10-CM | POA: Diagnosis not present

## 2023-06-29 DIAGNOSIS — M25552 Pain in left hip: Secondary | ICD-10-CM | POA: Diagnosis not present

## 2023-06-29 NOTE — Patient Instructions (Signed)
WALKING  Walking is a great form of exercise to increase your strength, endurance and overall fitness.  A walking program can help you start slowly and gradually build endurance as you go.  Everyone's ability is different, so each person's starting point will be different.  You do not have to follow them exactly.  The are just samples. You should simply find out what's right for you and stick to that program.   In the beginning, you'll start off walking 2-3 times a day for short distances.  As you get stronger, you'll be walking further at just 1-2 times per day.  A. You Can Walk For A Certain Length Of Time Each Day    Walk 5 minutes 3 times per day.  Increase 2 minutes every 2 days (3 times per day).  Work up to 25-30 minutes (1-2 times per day).   Example:   Day 1-2 5 minutes 3 times per day   Day 7-8 12 minutes 2-3 times per day   Day 13-14 25 minutes 1-2 times per day  B. You Can Walk For a Certain Distance Each Day     Distance can be substituted for time.    Example:   3 trips to mailbox (at road)   3 trips to corner of block   3 trips around the block  C. Go to local [employer] and use the track.    Walk for distance ____ around track  Or time ____ minutes  D. Walk ____ Jog ____ Run ___  Please only do the exercises that your therapist has initialed and dated  

## 2023-06-29 NOTE — Therapy (Signed)
OUTPATIENT PHYSICAL THERAPY TREATMENT THERAPY NOTE    Patient Name: [name] MRN: [id number] DOB:January 08, 1961, 62 y.o., male Today's Date: 06/29/2023  END OF SESSION:  PT End of Session - 06/29/23 0939     Visit Number 14    Number of Visits 16    Date for PT Re-Evaluation 07/06/23    Authorization Type [place] [facility] [employer]    Authorization - Number of Visits 27    PT Start Time 0932    PT Stop Time 1015    PT Time Calculation (min) 43 min                       Past Medical History:  Diagnosis Date   Chest pain    stres test neg x 2, cath 5/12; minimal LAD irregs; no obs CAD; normal LVF   Clotting disorder (HCC)    dvt   GERD (gastroesophageal reflux disease)    History of DVT of lower extremity    on chronic coumadin   Hypertension    Internal hemorrhoids    Cscope 2007   PAD (peripheral artery disease) (HCC)    a. left leg ischemia tx wtih embolectomy of left fem, pop, tib arteries with Dr. Edilia Bo - 08/2006;  b. known occlusion of right pop with collats - med Rx;  c.ABI's 8/09: R 1.0; L 0.91    PFO (patent foramen ovale)    per 08/2006 discharge summary, TEE showed EF 60%, small PFO with minimal right-to-left shunt with Valsalva; not felt to be the source of emboli, lifelong Coumadin recommended   Pneumonia    history of   Polysubstance abuse (HCC)    marijuana only   Renal infarct [facility])    R   Past Surgical History:  Procedure Laterality Date   CHOLECYSTECTOMY     COLONOSCOPY     INGUINAL HERNIA REPAIR Left 07/16/2019   Procedure: LAPAROSCOPIC LEFT INGUINAL HERNIA REPAIR WITH MESH;  Surgeon: Axel Filler, MD;  Location: [facility] OR;  Service: General;  Laterality: Left;   left leg blood clot removal 2009  2009   TONSILLECTOMY     TOTAL HIP ARTHROPLASTY Left 02/03/2023   Procedure: LEFT TOTAL HIP ARTHROPLASTY ANTERIOR APPROACH;  Surgeon: Tarry Kos, MD;  Location: [facility] OR;  Service: Orthopedics;  Laterality: Left;  3-C   WRIST SURGERY Left     Patient Active Problem List   Diagnosis Date Noted   Status post total replacement of left hip 02/03/2023   Primary osteoarthritis of left hip 02/02/2023   Avascular necrosis of bone of left hip (HCC) 12/23/2022   High risk heterosexual behavior 02/05/2021   History of colonic polyps 11/04/2019   PCP NOTES >>>>>>>>>>>>>> 07/26/2016   GERD (gastroesophageal reflux disease) 07/26/2016   Annual physical exam 07/25/2016   HTN (hypertension) 09/16/2014   Nausea without vomiting 11/29/2013   Deep vein thrombosis (HCC) 10/06/2010   Long term current use of anticoagulant 10/06/2010   Renal infarct (HCC) 10/06/2010   Dyslipidemia 09/03/2009   CHEST PAIN-UNSPECIFIED 09/03/2009   SHOULDER PAIN, RIGHT 03/02/2009   PERIPHERAL VASCULAR DISEASE 02/11/2009   SKIN LESION 02/04/2008   Polysubstance abuse (HCC) 05/28/2007   DISORDER, VASCULAR, KIDNEY 05/28/2007   DVT, HX OF 12/25/2006    PCP: Wanda Plump MD   REFERRING PROVIDER: Gershon Mussel  REFERRING DIAG: [phone] (ICD-10-CM) - Status post total replacement of left hip  THERAPY DIAG:  S/P total left hip arthroplasty  Pain in left hip  Rationale for Evaluation and Treatment: Rehabilitation  ONSET DATE: 02/03/23  SUBJECTIVE:   SUBJECTIVE STATEMENT: Dr Roda Shutters thinks I should be able to RTW and be released from his care. He told me to finish what I have scheduled at PT. I might want to do some more visits if I can, maybe 1 x per week. I know I need to start walking more. I had a hard time walking the halls of the hospital when my wife had a procedure.    PERTINENT HISTORY: AVN and OA L knee H/o DVT THA 02/03/23   PAIN:  Are you having pain? Yes: NPRS scale: 0/10 Pain location: Lt hip Pain description: tired, sore  Aggravating factors: standing, walking  Relieving factors: rest, has some pain meds  With standing long periods, leg gets tired and pain is 4/10  PRECAUTIONS: None  RED FLAGS: None   WEIGHT BEARING RESTRICTIONS:  No  FALLS:  Has patient fallen in last 6 months? Yes. Number of falls 3  LIVING ENVIRONMENT: Lives with: lives with their spouse Lives in: House/apartment Stairs: Yes: External: 3-4 steps; can reach bothhas ramp  Has following equipment at home: Quad cane large base and Walker - 2 wheeled  OCCUPATION: [employer]; fork lift stand up   PLOF: Independent with basic ADLs, Independent with household mobility with device, Independent with community mobility with device, Needs assistance with ADLs, Vocation/Vocational requirements: drives forklift , and Leisure: likes to fish, watch TV   PATIENT GOALS: Walk, resume mobilty   NEXT MD VISIT: 6 weeks   OBJECTIVE:   PATIENT SURVEYS:  FOTO 40% 05/25/23: 55% 06/21/23: 45%   SENSATION: Light touch: Impaired L thigh   EDEMA:  Min per patient   MUSCLE LENGTH: NT   POSTURE: rounded shoulders, forward head, anterior pelvic tilt, left pelvic obliquity, and flexed trunk   PALPATION: Sore to palpation along L thigh, numb proximally along incision   LOWER EXTREMITY ROM:  Active ROM Right eval Left eval Lt.  05/18/23  LT 06/21/23  Hip flexion  110 110   Hip extension      Hip abduction      Hip adduction      Hip internal rotation  15 20 25   Hip external rotation  25 30 50  Knee flexion  130     Knee extension  0    Ankle dorsiflexion      Ankle plantarflexion      Ankle inversion      Ankle eversion       (Blank rows = not tested)  LOWER EXTREMITY MMT:  MMT Right eval Left eval Left 04/27/2022 Lt.  05/25/23 Lt 06/21/23  Hip flexion 4+ 4- 4- 4   Hip extension       Hip abduction   3 3+ 4-  Hip adduction       Hip internal rotation       Hip external rotation       Knee flexion 5 5  5    Knee extension 5 4+ 4+ 5   Ankle dorsiflexion 5 5  5    Ankle plantarflexion       Ankle inversion       Ankle eversion        (Blank rows = not tested)  LOWER EXTREMITY SPECIAL TESTS:  NT  FUNCTIONAL TESTS:  5 times sit to  stand: 27 sec wth hands, unable to push up without UEs for 1st 3 tries   5 X STS: 19 sec  06/21/23:  2 MWT: 415 feet    GAIT: Distance walked: 100 Assistive device utilized: Quad cane large base Level of assistance: SBA Comments: mild LOB but recovered, turning head makes him unsteady   TODAY'S TREATMENT: [facility] Adult PT Treatment:                                                DATE: 06/29/23 Therapeutic Exercise: T.M. 5 minutes 1.0 MPH  STS x 10 standard chair 6 inch step up x 10 Lt 6 inch lateral step down x 10- pain Hip flexor stretch supine Side hip abduction  Therapeutic Activity: Climbing stairs with 1 UE assist - alternating pattern and good safety  Self Care: Walking program discussed   [facility] Adult PT Treatment:                                                DATE: 06/21/23 Therapeutic Exercise: Nustep, level 5 , LE x 5 minutes  Supine Hip piriformis stretch left S/L hip abduction x 10, hip abducted with  hip flexion and extension x 6 S/L clam x 10  Supine banded bridge green  Supine march green Supine clam green  Therapeutic Activity: 415 feet 2 MWT FOTO decreased    [facility] Adult PT Treatment:                                                DATE: 06/15/23 Therapeutic Exercise: NuStep L 5 UE and LE for 6 min  Supine hip flexor stretch red loop x 10 with PT assist then blue band on thighs  Bridge blue band x 10  Bridge with clam x 15 blue band Hip ER stretch (knee crossed/push/pull L hip flexor stretch  Sit to stand 10 lbs Squat 10 lbs x 10 to Airex  Lateral mini-lunge blue band with and without UE support  Manual Therapy: LAD  Passive L knee flexion in Thomas test (mod) position    [facility] Adult PT Treatment:                                                DATE: 06/13/23 Therapeutic Exercise: Nu Step 5 min, level 4 LE  LAQ 2x10 4# Marching 2x15 each on airex Lateral step ups 6" 2x10 STS chair c airex 2x10 Heel raises x20      [facility] Adult PT Treatment:                                                 DATE: 06/09/23 Therapeutic Exercise: Nu Step 5 min, level 4 LE  Knee to chest  Hip ER (foot on opp thigh) Marching x15 each SLR x 10 LLE PT  Banded hip ER/abd GTB x10 Banded bridge x 10 BluTB STS elevated mat table x10 LAQ x10 3# Heel raises x10   [facility] Adult  PT Treatment:                                                DATE: 06/02/23 Therapeutic Exercise: Nu Step 5 min, level 6  Knee to chest  Hip ER (foot on opp thigh) Marching x10 each SLR x 10 LLE with PT assist Banded hip ER/abd cues to slow in supine Banded bridge x 10  Sidelying clam green x 15  STS elevated mat table 2x10 Hip abd 3 lbs 2x10 each standing  Heel raises  [facility] Adult PT Treatment:                                                DATE: 05/25/23 Therapeutic Exercise: Nu Step 5 min, level 6  Wide knees hip rotation  Knee to chest  Hip ER (foot on opp thigh) SLR x 10 LLE with PT assist Banded hip ER/abd cues to slow in supine x15 Banded bridge x 12 Hip extension 3 lbs x 15 standing  Heel raises SLS < 10 sec each LE     PATIENT EDUCATION:  Education details: HEP Person educated: Patient and Child(ren) Education method: Programmer, multimedia, Facilities manager, Verbal cues, and Handouts Education comprehension: verbalized understanding, returned demonstration, and needs further education  HOME EXERCISE PROGRAM: Access Code: [email or web address] URL: [email or web address] Date: 04/13/2023 Prepared by: Karie Mainland  Exercises - Supine Bridge  - 1 x daily - 7 x weekly - 2 sets - 10 reps - 5 hold - Beginner Side Leg Lift  - 1 x daily - 7 x weekly - 2 sets - 10 reps - 5 hold - Hooklying Straight Leg Raise  - 1 x daily - 7 x weekly - 2 sets - 10 reps - 5 hold - Hooklying Clamshell with Resistance  - 1 x daily - 7 x weekly - 2 sets - 10 reps - 5 hold - Supine Hip Internal and External Rotation  - 1 x daily - 7 x weekly - 2 sets - 10 reps - 10 hold 06/29/23 - Step Up  - 1 x  daily - 7 x weekly - 1-2 sets - 10 reps - Sit to stand with control  - 1 x daily - 7 x weekly - 1-2 sets - 10 reps - Modified Thomas Stretch  - 1 x daily - 7 x weekly - 1 sets - 3 reps - 30 hold  Patient Education - walking program    ASSESSMENT:  CLINICAL IMPRESSION: Pt reports MD released him and told him he should be able to RTW now. Pt does not feel ready to resume his prior work. FOTO status taken recently indicates a decline in function. Began T.M walking at 1.0 MPH and pt fatigues with 5 minutes. Discussed walking program as he reports difficulty with ambulating with his wife in the hospital recently. Handout was provided for progressive walking program. Also updated pt's HEP to include closed chain strengthening as pt will reach end of POC next visit. He reports intermittent compliance with HEP, fatigues quickly with basic mat based strengthening.  He will be re-evaluated to determine need for ongoing care.      OBJECTIVE IMPAIRMENTS: Abnormal gait, decreased activity tolerance, decreased balance, decreased knowledge of use of DME,  decreased mobility, difficulty walking, decreased ROM, decreased strength, increased fascial restrictions, impaired flexibility, postural dysfunction, and pain.   ACTIVITY LIMITATIONS: carrying, lifting, bending, standing, squatting, sleeping, stairs, transfers, bed mobility, dressing, and locomotion level  PARTICIPATION LIMITATIONS: cleaning, interpersonal relationship, driving, shopping, community activity, occupation, and yard work  PERSONAL FACTORS: Profession and 1 comorbidity: none   are also affecting patient's functional outcome.   REHAB POTENTIAL: Excellent  CLINICAL DECISION MAKING: Stable/uncomplicated  EVALUATION COMPLEXITY: Low   GOALS: Goals reviewed with patient? Yes  SHORT TERM GOALS: Target date: 05/11/2023   Pt will be I with HEP for L hip ROM and strength  Baseline: 06/13/23:  tis completing therex/HEP correctly Goal status:  MET  2.  Patient will be able to transfer sit to supine without the need for hands to lift leg.  Baseline: L hip weakness, needs A . Update: he can do this sometimes, still a strain Goal status: MET  3.  Patient will be able to perform sit to stand without use of hands x 10 Baseline: unable . Update : done off standard mat table with AIREX under hips  Goal status: MET  4.  Pt will be able to walk with a cane in the clinic without cues for sequencing Baseline: needs cues  Goal status: MET   5.  Pt will be screened for balance deficits and goal set  Baseline: Unable to do this on evaluation Goal status: MET    LONG TERM GOALS: Target date: 07/06/2023    Patient will be independent with home program at the end of therapy Baseline: Unknown Goal status: ongoing   2.  Foto score will improve to 66% to demonstrate functional mobility increase Baseline: 40% 05/25/23: 58% 06/21/23:  Goal status: ongoing   3.  Patient will be able to walk to the grocery store with min  increase in fatigue, pain for 30- 45 min  Baseline: limited by fatigue and pain, 20-30 min  06/21/23: can do 30 min without difficulty.  Goal status: MET   4.  Pt will demo 5/5 strength in L hip for optimal gait and mobility  Baseline: 4-/5 05/25/23 4/5 hip flex and glutes 3+/5 06/21/23: 4- hip abdct  Goal status: ongoing   5.  Patient will report no difficulty with car/bed transfers Baseline: needs A from hands 10/17 improving , min occ difficulty  06/21/23: Lower cars are still difficulty  Goal status: ongoing, improved   6.  Pt will be able to complete 2 min walk test without cane, 350 feet  Baseline: 297 with cane . 10/17 334 without cane  06/21/23: 415 feet  Goal status: MET   PLAN:   PT FREQUENCY: 2x/week  PT DURATION: 8 weeks  PLANNED INTERVENTIONS: Therapeutic exercises, Therapeutic activity, Neuromuscular re-education, Balance training, Gait training, Patient/Family education, Self Care, Joint  mobilization, Stair training, DME instructions, Spinal mobilization, Cryotherapy, Moist heat, Manual therapy, and Re-evaluation  PLAN FOR NEXT SESSION: Re-assess on next visit, discharge? gait, endurance, standing: step ups, hip abd and ext strength , add banded hip ex.   Jannette Spanner, PTA 06/29/23 12:08 PM Phone: [phone] Fax: [phone]

## 2023-06-30 ENCOUNTER — Ambulatory Visit: Payer: 59 | Admitting: Physical Therapy

## 2023-07-04 ENCOUNTER — Ambulatory Visit: Payer: 59

## 2023-07-04 DIAGNOSIS — Z96642 Presence of left artificial hip joint: Secondary | ICD-10-CM

## 2023-07-04 DIAGNOSIS — M25652 Stiffness of left hip, not elsewhere classified: Secondary | ICD-10-CM | POA: Diagnosis not present

## 2023-07-04 DIAGNOSIS — R262 Difficulty in walking, not elsewhere classified: Secondary | ICD-10-CM | POA: Diagnosis not present

## 2023-07-04 DIAGNOSIS — M25552 Pain in left hip: Secondary | ICD-10-CM | POA: Diagnosis not present

## 2023-07-04 NOTE — Therapy (Addendum)
OUTPATIENT PHYSICAL THERAPY TREATMENT THERAPY NOTE  DISCHARGE ADDENDED   Patient Name: [name] MRN: [id number] DOB:[birth year], 62 y.o., male Today's Date: 07/04/2023   PHYSICAL THERAPY DISCHARGE SUMMARY  Visits from Start of Care: 15  Current functional level related to goals / functional outcomes: See below    Remaining deficits: Hip strength, gait, hip ROM   Education / Equipment: See below- gait, HEP, posture, RICE    Patient agrees to discharge. Patient goals were partially met. Patient is being discharged due to a change in medical status. Pt diagnosed with stomach CA.     END OF SESSION:  PT End of Session - 07/04/23 0937     Visit Number 15    Number of Visits 27    Date for PT Re-Evaluation 08/25/23    Authorization Type [facility] [employer]    Authorization - Number of Visits 27    PT Start Time 0930    PT Stop Time 1013    PT Time Calculation (min) 43 min    Activity Tolerance Patient tolerated treatment well    Behavior During Therapy WFL for tasks assessed/performed                        Past Medical History:  Diagnosis Date   Chest pain    stres test neg x 2, cath 5/12; minimal LAD irregs; no obs CAD; normal LVF   Clotting disorder (HCC)    dvt   GERD (gastroesophageal reflux disease)    History of DVT of lower extremity    on chronic coumadin   Hypertension    Internal hemorrhoids    Cscope 2007   PAD (peripheral artery disease) (HCC)    a. left leg ischemia tx wtih embolectomy of left fem, pop, tib arteries with Dr. Edilia Bo - 08/2006;  b. known occlusion of right pop with collats - med Rx;  c.ABI's 8/09: R 1.0; L 0.91    PFO (patent foramen ovale)    per 08/2006 discharge summary, TEE showed EF 60%, small PFO with minimal right-to-left shunt with Valsalva; not felt to be the source of emboli, lifelong Coumadin recommended   Pneumonia    history of   Polysubstance abuse (HCC)    marijuana only   Renal infarct (HCC)    R    Past Surgical History:  Procedure Laterality Date   CHOLECYSTECTOMY     COLONOSCOPY     INGUINAL HERNIA REPAIR Left 07/16/2019   Procedure: LAPAROSCOPIC LEFT INGUINAL HERNIA REPAIR WITH MESH;  Surgeon: Axel Filler, MD;  Location: [facility] OR;  Service: General;  Laterality: Left;   left leg blood clot removal 2009  2009   TONSILLECTOMY     TOTAL HIP ARTHROPLASTY Left 02/03/2023   Procedure: LEFT TOTAL HIP ARTHROPLASTY ANTERIOR APPROACH;  Surgeon: Tarry Kos, MD;  Location: [facility] OR;  Service: Orthopedics;  Laterality: Left;  3-C   WRIST SURGERY Left    Patient Active Problem List   Diagnosis Date Noted   Status post total replacement of left hip 02/03/2023   Primary osteoarthritis of left hip 02/02/2023   Avascular necrosis of bone of left hip (HCC) 12/23/2022   High risk heterosexual behavior 02/05/2021   History of colonic polyps 11/04/2019   PCP NOTES >>>>>>>>>>>>>> 07/26/2016   GERD (gastroesophageal reflux disease) 07/26/2016   Annual physical exam 07/25/2016   HTN (hypertension) 09/16/2014   Nausea without vomiting 11/29/2013   Deep vein thrombosis (HCC) 10/06/2010  Long term current use of anticoagulant 10/06/2010   Renal infarct (HCC) 10/06/2010   Dyslipidemia 09/03/2009   CHEST PAIN-UNSPECIFIED 09/03/2009   SHOULDER PAIN, RIGHT 03/02/2009   PERIPHERAL VASCULAR DISEASE 02/11/2009   SKIN LESION 02/04/2008   Polysubstance abuse (HCC) 05/28/2007   DISORDER, VASCULAR, KIDNEY 05/28/2007   DVT, HX OF 12/25/2006    PCP: Wanda Plump MD   REFERRING PROVIDER: Gershon Mussel  REFERRING DIAG: [phone] (ICD-10-CM) - Status post total replacement of left hip  THERAPY DIAG:  S/P total left hip arthroplasty  Pain in left hip  Stiffness of left hip, not elsewhere classified  Difficulty in walking, not elsewhere classified  Rationale for Evaluation and Treatment: Rehabilitation  ONSET DATE: 02/03/23  SUBJECTIVE:   SUBJECTIVE STATEMENT: Pt reports he would like to  continue with OPPT to work on his L leg strength and balance. He notes at times his balance feels off.  he can get off balance at time.   PERTINENT HISTORY: AVN and OA L knee H/o DVT THA 02/03/23   PAIN:  Are you having pain? Yes: NPRS scale: 0/10 Pain location: Lt hip Pain description: tired, sore  Aggravating factors: standing, walking  Relieving factors: rest, has some pain meds  With standing long periods, leg gets tired and pain is 4/10  PRECAUTIONS: None  RED FLAGS: None   WEIGHT BEARING RESTRICTIONS: No  FALLS:  Has patient fallen in last 6 months? Yes. Number of falls 3  LIVING ENVIRONMENT: Lives with: lives with their spouse Lives in: House/apartment Stairs: Yes: External: 3-4 steps; can reach bothhas ramp  Has following equipment at home: Quad cane large base and Walker - 2 wheeled  OCCUPATION: [employer]; fork lift stand up   PLOF: Independent with basic ADLs, Independent with household mobility with device, Independent with community mobility with device, Needs assistance with ADLs, Vocation/Vocational requirements: drives forklift , and Leisure: likes to fish, watch TV   PATIENT GOALS: Walk, resume mobilty   NEXT MD VISIT: 6 weeks   OBJECTIVE:   PATIENT SURVEYS:  FOTO 40% 05/25/23: 55% 06/21/23: 45%   SENSATION: Light touch: Impaired L thigh   EDEMA:  Min per patient   MUSCLE LENGTH: NT   POSTURE: rounded shoulders, forward head, anterior pelvic tilt, left pelvic obliquity, and flexed trunk   PALPATION: Sore to palpation along L thigh, numb proximally along incision   LOWER EXTREMITY ROM:  Active ROM Right eval Left eval Lt.  05/18/23  LT 06/21/23  Hip flexion  110 110   Hip extension      Hip abduction      Hip adduction      Hip internal rotation  15 20 25   Hip external rotation  25 30 50  Knee flexion  130     Knee extension  0    Ankle dorsiflexion      Ankle plantarflexion      Ankle inversion      Ankle eversion        (Blank rows = not tested)  LOWER EXTREMITY MMT:  MMT Right eval Left eval Left 04/27/2022 Lt.  05/25/23 Lt 06/21/23 LT  Hip flexion 4+ 4- 4- 4  4  Hip extension      3+  Hip abduction   3 3+ 4- 4-  Hip adduction        Hip internal rotation        Hip external rotation      4-  Knee flexion 5 5  5  Knee extension 5 4+ 4+ 5    Ankle dorsiflexion 5 5  5     Ankle plantarflexion        Ankle inversion        Ankle eversion         (Blank rows = not tested)  LOWER EXTREMITY SPECIAL TESTS:  NT  FUNCTIONAL TESTS:  5 times sit to stand: 27 sec wth hands, unable to push up without UEs for 1st 3 tries   5 X STS: 19 sec  06/21/23: 2 MWT: 415 feet   BERG BALANCE  Sitting to Standing: Numbers; 0-4: 4  4. Stands without using hands and stabilize independently  3. Stands independently using hands  2. Stands using hands after multiple trials  1. Min A to stand  0. Mod-Max A to stand Standing unsupported: Numbers; 0-4: 4  4. Stands safely for 2 minutes  3. Stands 2 minutes with supervision  2. Stands 30 seconds unsupported  1. Needs several tries to stand unsupported for 30 seconds  0. Unable to stand unsupported for 30 seconds Sitting unsupported: Numbers; 0-4: 4  4. Sits for 2 minutes independently  3. Sits for 2 minutes with supervision  2. Able to sit 30 seconds  1. Able to sit 10 seconds  0. Unable to sit for 10 seconds Standing to Sitting: Numbers; 0-4: 4 4. Sits safely with minimal use of hands 3. Controls descent with hands 2. Uses back of legs against chair to control descent 1. Sits independently, but uncontrolled descent 0. Needs assistance Transfers: Numbers; 0-4: 4  4. Transfers safely with minor use of hands  3. Transfers safely definite use of hands  2. Transfers with verbal cueing/supervision  1. Needs 1 person assist  0. Needs 2 person assist  Standing with eyes closed: Numbers; 0-4: 4  4. Stands safely for 10 seconds  3. Stands 10 seconds with  supervision   2. Able to stand for 3 seconds  1. Unable to keep eyes closed for 3 seconds, but is safe  0. Needs assist to keep from falling Standing with feet together: Numbers; 0-4: 4 4. Stands for 1 minute safely 3. Stands for 1 minute with supervision 2. Unable to hold for 30 seconds  1. Needs help to attain position but can hold for 15 seconds  0. Needs help to attain position and unable to hold for 15 seconds Reaching forward with outstretched arm: Numbers; 0-4: 4  4. Reaches forward 10 inches  3. Reaches forward 5 inches  2. Reaches forward 2 inches  1. Reaches forward with supervision  0. Loses balance/requires assistace Retrieving object from the floor: Numbers; 0-4: 4 4. Able to pick up easily and safely 3. Able to pick up with supervision 2. Unable to pick up, but reaches within 1-2 inches independently 1. Unable to pick up and needs supervision 0. Unable/needs assistance to keep from falling  Turning to look behind: Numbers; 0-4: 3  4. Looks behind from both sides and weight shifts well  3. Looks behind one side only, other side less weight shift  2. Turns sideways only, maintains balance  1. Needs supervision when turning  0. Needs assistance  Turning 360 degrees: Numbers; 0-4: 2  4. Able to turn in </=4 seconds  3. Able to turn on one side in </= 4 seconds   2. Able to turn slowly, but safely  1. Needs supervision or verbal cueing  0. Needs assistance Place alternate foot on stool: Numbers; 0-4: 4  4. Completes 8 steps in 20 seconds 3. Completes 8 steps in >20 seconds 2. 4 steps without assistance/supervision 1. Completes >2 steps with minimal assist 0. Unable, needs assist to keep from falling Standing with one foot in front: Numbers; 0-4: 3  4. Independent tandem for 30 seconds  3. Independent foot ahead for 30 seconds  2. Independent small step for 30 seconds  1. Needs help to step, but can hold for 15 seconds  0. Loses balance while  standing/stepping Standing on one foot: Numbers; 0-4: 1 4. Holds >10 seconds 3. Holds 5-10 seconds 2. Holds >/=3 seconds  1. Holds <3 seconds 0. Unable   Total Score: 49/56    GAIT: Distance walked: 100 Assistive device utilized: Quad cane large base Level of assistance: SBA Comments: mild LOB but recovered, turning head makes him unsteady   TODAY'S TREATMENT: [facility] Adult PT Treatment:                                                DATE: 07/04/23 Therapeutic Exercise: Nustep 7 mins L5 LE MMT L LE S/L hip abd 2x10 S/L hip ER GTB Therapeutic Activity: 5xSTS = 21 sec Berg balance test Single leg standing: L 2", R 20+"  [facility] Adult PT Treatment:                                                DATE: 06/29/23 Therapeutic Exercise: T.M. 5 minutes 1.0 MPH  STS x 10 standard chair 6 inch step up x 10 Lt 6 inch lateral step down x 10- pain Hip flexor stretch supine Side hip abduction  Therapeutic Activity: Climbing stairs with 1 UE assist - alternating pattern and good safety  Self Care: Walking program discussed   [facility] Adult PT Treatment:                                                DATE: 06/21/23 Therapeutic Exercise: Nustep, level 5 , LE x 5 minutes  Supine Hip piriformis stretch left S/L hip abduction x 10, hip abducted with  hip flexion and extension x 6 S/L clam x 10  Supine banded bridge green  Supine march green Supine clam green  Therapeutic Activity: 415 feet 2 MWT FOTO decreased   PATIENT EDUCATION:  Education details: HEP Person educated: Patient and Child(ren) Education method: Programmer, multimedia, Facilities manager, Verbal cues, and Handouts Education comprehension: verbalized understanding, returned demonstration, and needs further education  HOME EXERCISE PROGRAM: Access Code: [email or web address] URL: [email or web address] Date: 04/13/2023 Prepared by: Karie Mainland  Exercises - Supine Bridge  - 1 x daily - 7 x weekly - 2 sets - 10 reps - 5 hold -  Beginner Side Leg Lift  - 1 x daily - 7 x weekly - 2 sets - 10 reps - 5 hold - Hooklying Straight Leg Raise  - 1 x daily - 7 x weekly - 2 sets - 10 reps - 5 hold - Hooklying Clamshell with Resistance  - 1 x daily - 7 x weekly - 2 sets - 10 reps - 5 hold - Supine Hip Internal and  External Rotation  - 1 x daily - 7 x weekly - 2 sets - 10 reps - 10 hold 06/29/23 - Step Up  - 1 x daily - 7 x weekly - 1-2 sets - 10 reps - Sit to stand with control  - 1 x daily - 7 x weekly - 1-2 sets - 10 reps - Modified Thomas Stretch  - 1 x daily - 7 x weekly - 1 sets - 3 reps - 30 hold  Patient Education - walking program    ASSESSMENT:  CLINICAL IMPRESSION: Pt reports he would like to continue with PT to further strengthen is his L LE for improved balance and confidence with functional mobility. PT today was completed for L LE strengthening and functional activities. With 5xSTS, pt's ability has not improved in comparison to when 1st assessed; the Berg balance test indicates min to mod issues; while L single leg standing ability is significant less than the R. These functional and balance issues reflect strength     limitations of the L LE, which has gradually improved. Pt will continue to benefit from skilled PT 2w6 to address impairments for improved balance and functional mobility. New PT goals have been established.  OBJECTIVE IMPAIRMENTS: Abnormal gait, decreased activity tolerance, decreased balance, decreased knowledge of use of DME, decreased mobility, difficulty walking, decreased ROM, decreased strength, increased fascial restrictions, impaired flexibility, postural dysfunction, and pain.   ACTIVITY LIMITATIONS: carrying, lifting, bending, standing, squatting, sleeping, stairs, transfers, bed mobility, dressing, and locomotion level  PARTICIPATION LIMITATIONS: cleaning, interpersonal relationship, driving, shopping, community activity, occupation, and yard work  PERSONAL FACTORS: Profession and 1  comorbidity: none   are also affecting patient's functional outcome.   REHAB POTENTIAL: Excellent  CLINICAL DECISION MAKING: Stable/uncomplicated  EVALUATION COMPLEXITY: Low   GOALS: Goals reviewed with patient? Yes  SHORT TERM GOALS: Target date: 05/11/2023   Pt will be I with HEP for L hip ROM and strength  Baseline: 06/13/23:  tis completing therex/HEP correctly Goal status: MET  2.  Patient will be able to transfer sit to supine without the need for hands to lift leg.  Baseline: L hip weakness, needs A . Update: he can do this sometimes, still a strain Goal status: MET  3.  Patient will be able to perform sit to stand without use of hands x 10 Baseline: unable . Update : done off standard mat table with AIREX under hips  Goal status: MET  4.  Pt will be able to walk with a cane in the clinic without cues for sequencing Baseline: needs cues  Goal status: MET   5.  Pt will be screened for balance deficits and goal set  Baseline: Unable to do this on evaluation Goal status: MET    LONG TERM GOALS: Target date: 09/04/23    Patient will be independent with home program at the end of therapy Baseline: Unknown Goal status: Ongoing   2.  Foto score will improve to 66% to demonstrate functional mobility increase Baseline: 40% 05/25/23: 58% 06/21/23: 45% Goal status: Ongoing- Not yet met   3.  Patient will be able to walk to the grocery store with min  increase in fatigue, pain for 30- 45 min  Baseline: limited by fatigue and pain, 20-30 min  06/21/23: can do 30 min without difficulty.  Goal status: MET   4.  Pt will demo 5/5 strength in L hip for optimal gait and mobility  Baseline: 4-/5 05/25/23 4/5 hip flex and glutes 3+/5  06/21/23: 4- hip abdct 07/04/23: See flow sheet Goal status: ONGOING-short of goal  5.  Patient will report no difficulty with car/bed transfers Baseline: needs A from hands 10/17 improving , min occ difficulty  06/21/23: Lower cars are still  difficulty  Goal status: Ongoing, improved, not yet met   6.  Pt will be able to complete 2 min walk test without cane, 350 feet  Baseline: 297 with cane . 10/17 334 without cane  06/21/23: 415 feet  Goal status: MET  7. Improve 5xSTS by MCID of 5" as indication of improved functional mobility   Baseline: 19 sec  09/02/22: 21 sec  Goal Status: NEW  8. Improve single leg standing balance to 10" or greater for improved safety with functional mobility  Baseline: L 2"; R 20+"  Goals Status: NEW  9. Pt's Sharlene Motts will improve to 53/56 and indication of improved balance for safety with functional mobility  Baseline: 49/56  Goal Status: NEW    PLAN:   PT FREQUENCY: 2x/week  PT DURATION: 6 weeks  PLANNED INTERVENTIONS: Therapeutic exercises, Therapeutic activity, Neuromuscular re-education, Balance training, Gait training, Patient/Family education, Self Care, Joint mobilization, Stair training, DME instructions, Spinal mobilization, Cryotherapy, Moist heat, Manual therapy, and Re-evaluation  PLAN FOR NEXT SESSION: Continue PT 2w6. Gait, endurance, standing: step ups, hip abd and ext strength , add banded hip ex.   Judythe Postema MS, PT 07/04/23 1:54 PM    Karie Mainland, PT 07/26/23 2:25 PM Phone: [phone] Fax: [phone]

## 2023-07-10 ENCOUNTER — Telehealth: Payer: Self-pay | Admitting: Orthopaedic Surgery

## 2023-07-10 ENCOUNTER — Telehealth: Payer: Self-pay | Admitting: Internal Medicine

## 2023-07-10 NOTE — Telephone Encounter (Signed)
Spoke with patient. Can you fax over the office notes from last visit?

## 2023-07-10 NOTE — Telephone Encounter (Signed)
Patient called his insurance needed proof of the results of the last visit. If you can fax it over to [phone] Attention to Claim #[id number]

## 2023-07-10 NOTE — Telephone Encounter (Signed)
Pt called stating he hernia is very painful. States he was referred a couple months ago but did not see a Careers adviser because he was having a hip replacement. He would now like to schedule. Advised pt the referral has been closed and we will need to be seen by [place] again. Pt asked if he could see pcp or Dr.Wendling since they have both seen him for this. Scheduled with Dr.Wendling tmr. Pt states when he coughs or sneezes pain is a 10-15 out of 10 and also has pain urinating. Transferred to triage for nurse eval.

## 2023-07-10 NOTE — Telephone Encounter (Signed)
Patient Name [name] Patient DOB 04/02/1961 Call Type Message Only Information Provided Reason for Call Request to Schedule Office Appointment Initial Comment Caller states he has been having a groin situation for about 2-3 months. He had a hip replacement. He feels a tug while him and his wife was having relations. He said he feels the tug when he sneezes, coughs or anything. He states it feels like his bottom is falling out. He wants an appointment Disp. Time Disposition Final User 07/10/2023 9:17:37 AM General Information Provided Yes Sinclair Ship, Tina Call Closed By: Aviva Kluver Transaction Date/Time: 07/10/2023 9:09:05 AM (ET)

## 2023-07-10 NOTE — Telephone Encounter (Signed)
Appt already scheduled for tomorrow

## 2023-07-11 ENCOUNTER — Encounter: Payer: Self-pay | Admitting: Family Medicine

## 2023-07-11 ENCOUNTER — Ambulatory Visit (INDEPENDENT_AMBULATORY_CARE_PROVIDER_SITE_OTHER): Payer: 59 | Admitting: Family Medicine

## 2023-07-11 ENCOUNTER — Telehealth: Payer: Self-pay | Admitting: Orthopaedic Surgery

## 2023-07-11 VITALS — BP 120/76 | HR 104 | Temp 98.0°F | Resp 16 | Ht 73.0 in | Wt 171.0 lb

## 2023-07-11 DIAGNOSIS — R102 Pelvic and perineal pain: Secondary | ICD-10-CM | POA: Diagnosis not present

## 2023-07-11 MED ORDER — SULFAMETHOXAZOLE-TRIMETHOPRIM 800-160 MG PO TABS: 1.0000 | ORAL_TABLET | Freq: Two times a day (BID) | ORAL | 0 refills | Status: DC

## 2023-07-11 NOTE — Telephone Encounter (Signed)
06/23/2023 progress note faxed to [employer] [phone]

## 2023-07-11 NOTE — Patient Instructions (Addendum)
Try 2 tablespoons of milk of mag in 4 oz of warm prune juice. Do that and wait a couple hours. If no improvement, try a Dulcolax suppository and then let me know if we are still having issues.   Stay hydrated.  If the bowel movement does not help, start the antibiotic.   We will be in touch with your results.   Let us know if you need anything.

## 2023-07-11 NOTE — Telephone Encounter (Signed)
No restrictions or disabilities

## 2023-07-11 NOTE — Telephone Encounter (Signed)
[name] long term disability from [facility] called requesting a call for clarification about office notes sent. Please call [name] at [phone]. SO she can determine if pt is disabled.

## 2023-07-11 NOTE — Progress Notes (Signed)
Chief Complaint  Patient presents with   Hernia    Discuss Hernia    [name] is a 62 y.o. male here for possible UTI.  Duration: 2 weeks Symptoms: Dysuria, constipation, N/V, testicular pain, perineal pain, decreased BM's Denies: urinary frequency, hematuria, urinary hesitancy, fever, flank pain, discharge Hx of recurrent UTI? No Trauma? No Denies new sexual partners. He is circumcised.   Past Medical History:  Diagnosis Date   Chest pain    stres test neg x 2, cath 5/12; minimal LAD irregs; no obs CAD; normal LVF   Clotting disorder (HCC)    dvt   GERD (gastroesophageal reflux disease)    History of DVT of lower extremity    on chronic coumadin   Hypertension    Internal hemorrhoids    Cscope 2007   PAD (peripheral artery disease) (HCC)    a. left leg ischemia tx wtih embolectomy of left fem, pop, tib arteries with Dr. Edilia Bo - 08/2006;  b. known occlusion of right pop with collats - med Rx;  c.ABI's 8/09: R 1.0; L 0.91    PFO (patent foramen ovale)    per 08/2006 discharge summary, TEE showed EF 60%, small PFO with minimal right-to-left shunt with Valsalva; not felt to be the source of emboli, lifelong Coumadin recommended   Pneumonia    history of   Polysubstance abuse (HCC)    marijuana only   Renal infarct (HCC)    R     BP 120/76 (BP Location: Left Arm, Patient Position: Sitting, Cuff Size: Normal)   Pulse (!) 104   Temp 98 F (36.7 C) (Oral)   Resp 16   Ht 6\' 1"  (1.854 m)   Wt 171 lb (77.6 kg)   SpO2 98%   BMI 22.56 kg/m  General: Awake, alert, appears stated age Heart: RRR Lungs: CTAB, normal respiratory effort, no accessory muscle usage Abd: BS+, soft, NT, ND, no masses or organomegaly MSK: No CVA tenderness, neg Lloyd's sign Rectal: Sphincter of good tone, no masses, prostate smooth, slightly enlarged, without bogginess or ttp GU: No hernia. Circumised, no lesions. Mild ttp over testicles b/l without localization. Psych: Age appropriate  judgment and insight  Suprapubic abdominal pain - Plan: Urine Culture, sulfamethoxazole-trimethoprim (BACTRIM DS) 800-160 MG tablet, Urinalysis, Routine w reflex microscopic  Empirically tx for infection with 14 d of Bactrim. Ck urine. Rec'd having a good BM, cocktail recipe provided in AVS. Stay hydrated. Seek immediate care if pt starts to develop fevers, new/worsening symptoms, uncontrollable N/V. F/u prn. The patient voiced understanding and agreement to the plan.  Jilda Roche [place], DO 07/11/23 3:11 PM

## 2023-07-12 ENCOUNTER — Telehealth: Payer: Self-pay | Admitting: Orthopaedic Surgery

## 2023-07-12 LAB — URINALYSIS, ROUTINE W REFLEX MICROSCOPIC
Bilirubin Urine: NEGATIVE
Hgb urine dipstick: NEGATIVE
Ketones, ur: NEGATIVE
Leukocytes,Ua: NEGATIVE
Nitrite: NEGATIVE
RBC / HPF: NONE SEEN (ref 0–?)
Specific Gravity, Urine: <=1.005 — AB (ref 1.000–1.030)
Urine Glucose: NEGATIVE
Urobilinogen, UA: 0.2 (ref 0.0–1.0)
pH: 6 (ref 5.0–8.0)

## 2023-07-12 LAB — URINE CULTURE
MICRO NUMBER:: 15802007
Result:: NO GROWTH
SPECIMEN QUALITY:: ADEQUATE

## 2023-07-12 NOTE — Telephone Encounter (Signed)
What's he requesting?  6 more weeks OOW?

## 2023-07-12 NOTE — Telephone Encounter (Signed)
Spoke with patient. He said that your last note releases him and back to work. He states that he has currently been laid off from work, but cannot work right now. He said that his therapist is recommending 6 more weeks of PT to work on balance and strengthening.  Please advise if I can write a new note stating that he isn't ready to work yet, and needs more PT. [place] needs to know before Friday.

## 2023-07-12 NOTE — Telephone Encounter (Signed)
Patient called advised he still have 6 more weeks of (PT) and is still having problems with his left hip. Patient said he still have balance and strength issue with his left hip. Patient said he is having pain above his knee and down to his ankle. The number to contact patient is [phone]

## 2023-07-12 NOTE — Telephone Encounter (Signed)
Waiting on Xu to respond to another message, after speaking with patient.

## 2023-07-13 ENCOUNTER — Telehealth: Payer: Self-pay | Admitting: *Deleted

## 2023-07-13 NOTE — Telephone Encounter (Signed)
Patient notified about new letter. Please fax to [facility]. Thanks!!!

## 2023-07-13 NOTE — Telephone Encounter (Signed)
[facility] note faxed [employer] [phone]

## 2023-07-13 NOTE — Telephone Encounter (Signed)
Great. Let's stay the course and have him follow up w his reg PCP next week if no better.

## 2023-07-13 NOTE — Telephone Encounter (Signed)
New note placed in chart. Patient notified. [place] notified via fax.

## 2023-07-13 NOTE — Telephone Encounter (Signed)
Patient stated that he feels like he is getting little better.  It does not hurt as bad now when he coughs or sneeze.  Still has a little blood.

## 2023-07-13 NOTE — Telephone Encounter (Signed)
-----   Message from [name] sent at 07/13/2023  4:43 AM EST ----- I am pretty sure he said he doesn't use MyChart per our convo. Can you let him know his urine testing is neg. How is he feeling since starting treatment?

## 2023-07-14 NOTE — Telephone Encounter (Signed)
Sent pt mychart message to f/u with pcp in few weeks per Dr.Wendling .

## 2023-07-16 ENCOUNTER — Encounter (HOSPITAL_COMMUNITY): Payer: Self-pay | Admitting: Emergency Medicine

## 2023-07-16 ENCOUNTER — Emergency Department (HOSPITAL_COMMUNITY): Payer: 59

## 2023-07-16 ENCOUNTER — Other Ambulatory Visit: Payer: Self-pay

## 2023-07-16 ENCOUNTER — Inpatient Hospital Stay (HOSPITAL_COMMUNITY)
Admission: EM | Admit: 2023-07-16 | Discharge: 2023-07-20 | DRG: 375 | Disposition: A | Discharge status: Home / Self Care | Payer: 59 | Attending: Internal Medicine | Admitting: Internal Medicine

## 2023-07-16 DIAGNOSIS — K2900 Acute gastritis without bleeding: Secondary | ICD-10-CM | POA: Diagnosis not present

## 2023-07-16 DIAGNOSIS — R1010 Upper abdominal pain, unspecified: Secondary | ICD-10-CM

## 2023-07-16 DIAGNOSIS — K920 Hematemesis: Secondary | ICD-10-CM | POA: Diagnosis present

## 2023-07-16 DIAGNOSIS — J189 Pneumonia, unspecified organism: Secondary | ICD-10-CM | POA: Diagnosis not present

## 2023-07-16 DIAGNOSIS — C786 Secondary malignant neoplasm of retroperitoneum and peritoneum: Secondary | ICD-10-CM | POA: Diagnosis not present

## 2023-07-16 DIAGNOSIS — Z86718 Personal history of other venous thrombosis and embolism: Secondary | ICD-10-CM

## 2023-07-16 DIAGNOSIS — Z79899 Other long term (current) drug therapy: Secondary | ICD-10-CM

## 2023-07-16 DIAGNOSIS — K769 Liver disease, unspecified: Secondary | ICD-10-CM | POA: Diagnosis not present

## 2023-07-16 DIAGNOSIS — R933 Abnormal findings on diagnostic imaging of other parts of digestive tract: Secondary | ICD-10-CM | POA: Diagnosis not present

## 2023-07-16 DIAGNOSIS — R112 Nausea with vomiting, unspecified: Secondary | ICD-10-CM | POA: Diagnosis present

## 2023-07-16 DIAGNOSIS — J432 Centrilobular emphysema: Secondary | ICD-10-CM | POA: Diagnosis not present

## 2023-07-16 DIAGNOSIS — R1084 Generalized abdominal pain: Secondary | ICD-10-CM | POA: Diagnosis not present

## 2023-07-16 DIAGNOSIS — Q2112 Patent foramen ovale: Secondary | ICD-10-CM | POA: Diagnosis not present

## 2023-07-16 DIAGNOSIS — F17210 Nicotine dependence, cigarettes, uncomplicated: Secondary | ICD-10-CM | POA: Diagnosis present

## 2023-07-16 DIAGNOSIS — I739 Peripheral vascular disease, unspecified: Secondary | ICD-10-CM | POA: Diagnosis present

## 2023-07-16 DIAGNOSIS — D62 Acute posthemorrhagic anemia: Secondary | ICD-10-CM | POA: Diagnosis not present

## 2023-07-16 DIAGNOSIS — C163 Malignant neoplasm of pyloric antrum: Principal | ICD-10-CM | POA: Diagnosis present

## 2023-07-16 DIAGNOSIS — R188 Other ascites: Secondary | ICD-10-CM | POA: Diagnosis present

## 2023-07-16 DIAGNOSIS — Z716 Tobacco abuse counseling: Secondary | ICD-10-CM

## 2023-07-16 DIAGNOSIS — R935 Abnormal findings on diagnostic imaging of other abdominal regions, including retroperitoneum: Secondary | ICD-10-CM | POA: Insufficient documentation

## 2023-07-16 DIAGNOSIS — Z860101 Personal history of adenomatous and serrated colon polyps: Secondary | ICD-10-CM | POA: Diagnosis not present

## 2023-07-16 DIAGNOSIS — C169 Malignant neoplasm of stomach, unspecified: Secondary | ICD-10-CM | POA: Diagnosis not present

## 2023-07-16 DIAGNOSIS — K254 Chronic or unspecified gastric ulcer with hemorrhage: Secondary | ICD-10-CM | POA: Diagnosis not present

## 2023-07-16 DIAGNOSIS — Z888 Allergy status to other drugs, medicaments and biological substances status: Secondary | ICD-10-CM

## 2023-07-16 DIAGNOSIS — K922 Gastrointestinal hemorrhage, unspecified: Secondary | ICD-10-CM | POA: Diagnosis present

## 2023-07-16 DIAGNOSIS — F1290 Cannabis use, unspecified, uncomplicated: Secondary | ICD-10-CM | POA: Diagnosis present

## 2023-07-16 DIAGNOSIS — K2970 Gastritis, unspecified, without bleeding: Secondary | ICD-10-CM | POA: Diagnosis not present

## 2023-07-16 DIAGNOSIS — K2100 Gastro-esophageal reflux disease with esophagitis, without bleeding: Secondary | ICD-10-CM | POA: Diagnosis present

## 2023-07-16 DIAGNOSIS — E785 Hyperlipidemia, unspecified: Secondary | ICD-10-CM | POA: Diagnosis present

## 2023-07-16 DIAGNOSIS — R1013 Epigastric pain: Secondary | ICD-10-CM | POA: Diagnosis not present

## 2023-07-16 DIAGNOSIS — Z8249 Family history of ischemic heart disease and other diseases of the circulatory system: Secondary | ICD-10-CM

## 2023-07-16 DIAGNOSIS — K5730 Diverticulosis of large intestine without perforation or abscess without bleeding: Secondary | ICD-10-CM | POA: Diagnosis present

## 2023-07-16 DIAGNOSIS — K3189 Other diseases of stomach and duodenum: Secondary | ICD-10-CM | POA: Diagnosis present

## 2023-07-16 DIAGNOSIS — I10 Essential (primary) hypertension: Secondary | ICD-10-CM | POA: Diagnosis present

## 2023-07-16 DIAGNOSIS — Z860100 Personal history of colon polyps, unspecified: Secondary | ICD-10-CM

## 2023-07-16 DIAGNOSIS — D72829 Elevated white blood cell count, unspecified: Secondary | ICD-10-CM | POA: Diagnosis not present

## 2023-07-16 DIAGNOSIS — R634 Abnormal weight loss: Secondary | ICD-10-CM

## 2023-07-16 DIAGNOSIS — K219 Gastro-esophageal reflux disease without esophagitis: Secondary | ICD-10-CM | POA: Diagnosis present

## 2023-07-16 DIAGNOSIS — Z9049 Acquired absence of other specified parts of digestive tract: Secondary | ICD-10-CM

## 2023-07-16 DIAGNOSIS — K5900 Constipation, unspecified: Secondary | ICD-10-CM

## 2023-07-16 DIAGNOSIS — F1090 Alcohol use, unspecified, uncomplicated: Secondary | ICD-10-CM | POA: Diagnosis not present

## 2023-07-16 DIAGNOSIS — R180 Malignant ascites: Secondary | ICD-10-CM

## 2023-07-16 DIAGNOSIS — N289 Disorder of kidney and ureter, unspecified: Secondary | ICD-10-CM | POA: Diagnosis not present

## 2023-07-16 DIAGNOSIS — Z7901 Long term (current) use of anticoagulants: Secondary | ICD-10-CM | POA: Diagnosis not present

## 2023-07-16 DIAGNOSIS — Z96642 Presence of left artificial hip joint: Secondary | ICD-10-CM | POA: Diagnosis present

## 2023-07-16 DIAGNOSIS — C801 Malignant (primary) neoplasm, unspecified: Secondary | ICD-10-CM | POA: Diagnosis not present

## 2023-07-16 DIAGNOSIS — K2990 Gastroduodenitis, unspecified, without bleeding: Secondary | ICD-10-CM | POA: Diagnosis not present

## 2023-07-16 DIAGNOSIS — D6832 Hemorrhagic disorder due to extrinsic circulating anticoagulants: Secondary | ICD-10-CM | POA: Diagnosis present

## 2023-07-16 DIAGNOSIS — K668 Other specified disorders of peritoneum: Secondary | ICD-10-CM | POA: Diagnosis not present

## 2023-07-16 DIAGNOSIS — T45515A Adverse effect of anticoagulants, initial encounter: Secondary | ICD-10-CM | POA: Diagnosis present

## 2023-07-16 DIAGNOSIS — K449 Diaphragmatic hernia without obstruction or gangrene: Secondary | ICD-10-CM | POA: Diagnosis present

## 2023-07-16 DIAGNOSIS — G8929 Other chronic pain: Secondary | ICD-10-CM | POA: Diagnosis not present

## 2023-07-16 LAB — TYPE AND SCREEN
ABO/RH(D): A POS
Antibody Screen: NEGATIVE

## 2023-07-16 LAB — COMPREHENSIVE METABOLIC PANEL
ALT: 33 U/L (ref 0–44)
AST: 33 U/L (ref 15–41)
Albumin: 4.2 g/dL (ref 3.5–5.0)
Alkaline Phosphatase: 90 U/L (ref 38–126)
Anion gap: 15 (ref 5–15)
BUN: 12 mg/dL (ref 6–20)
CO2: 20 mmol/L — ABNORMAL LOW (ref 22–32)
Calcium: 9.9 mg/dL (ref 8.9–10.3)
Chloride: 101 mmol/L (ref 98–111)
Creatinine, Ser: 1.15 mg/dL (ref 0.61–1.24)
GFR, Estimated: >60 mL/min (ref >60)
Glucose, Bld: 96 mg/dL (ref 70–99)
Potassium: 3.4 mmol/L — ABNORMAL LOW (ref 3.5–5.1)
Sodium: 136 mmol/L (ref 135–145)
Total Bilirubin: 1.1 mg/dL (ref <1.2)
Total Protein: 7.1 g/dL (ref 6.5–8.1)

## 2023-07-16 LAB — CBC WITH DIFFERENTIAL/PLATELET
Abs Immature Granulocytes: 0.03 10*3/uL (ref 0.00–0.07)
Basophils Absolute: 0 10*3/uL (ref 0.0–0.1)
Basophils Relative: 0 %
Eosinophils Absolute: 0.1 10*3/uL (ref 0.0–0.5)
Eosinophils Relative: 0 %
HCT: 46.4 % (ref 39.0–52.0)
Hemoglobin: 15.7 g/dL (ref 13.0–17.0)
Immature Granulocytes: 0 %
Lymphocytes Relative: 10 %
Lymphs Abs: 1.2 10*3/uL (ref 0.7–4.0)
MCH: 32.9 pg (ref 26.0–34.0)
MCHC: 33.8 g/dL (ref 30.0–36.0)
MCV: 97.3 fL (ref 80.0–100.0)
Monocytes Absolute: 0.5 10*3/uL (ref 0.1–1.0)
Monocytes Relative: 4 %
Neutro Abs: 10.6 10*3/uL — ABNORMAL HIGH (ref 1.7–7.7)
Neutrophils Relative %: 86 %
Platelets: 309 10*3/uL (ref 150–400)
RBC: 4.77 MIL/uL (ref 4.22–5.81)
RDW: 14.9 % (ref 11.5–15.5)
WBC: 12.4 10*3/uL — ABNORMAL HIGH (ref 4.0–10.5)
nRBC: 0 % (ref 0.0–0.2)

## 2023-07-16 LAB — APTT: aPTT: 25 s (ref 24–36)

## 2023-07-16 LAB — LIPASE, BLOOD: Lipase: 30 U/L (ref 11–51)

## 2023-07-16 LAB — HIV ANTIBODY (ROUTINE TESTING W REFLEX): HIV Screen 4th Generation wRfx: NONREACTIVE

## 2023-07-16 LAB — MAGNESIUM: Magnesium: 1.5 mg/dL — ABNORMAL LOW (ref 1.7–2.4)

## 2023-07-16 LAB — PHOSPHORUS: Phosphorus: 3.4 mg/dL (ref 2.5–4.6)

## 2023-07-16 MED ORDER — ACETAMINOPHEN 650 MG RE SUPP: 650.0000 mg | Freq: Four times a day (QID) | RECTAL | Status: DC | PRN

## 2023-07-16 MED ORDER — THIAMINE HCL 100 MG/ML IJ SOLN
100.0000 mg | Freq: Every day | INTRAMUSCULAR | Status: DC
  Administered 2023-07-16 – 2023-07-18 (×3): 100 mg via INTRAVENOUS
  Filled 2023-07-16 (×3): qty 2

## 2023-07-16 MED ORDER — THIAMINE HCL 100 MG/ML IJ SOLN: 100.0000 mg | Freq: Once | INTRAMUSCULAR | Status: DC

## 2023-07-16 MED ORDER — CHLORDIAZEPOXIDE HCL 5 MG PO CAPS
25.0000 mg | ORAL_CAPSULE | Freq: Three times a day (TID) | ORAL | Status: AC
  Administered 2023-07-17 (×2): 25 mg via ORAL
  Filled 2023-07-16 (×2): qty 5

## 2023-07-16 MED ORDER — SODIUM CHLORIDE 0.9% FLUSH
3.0000 mL | Freq: Two times a day (BID) | INTRAVENOUS | Status: DC
  Administered 2023-07-17 – 2023-07-20 (×7): 3 mL via INTRAVENOUS

## 2023-07-16 MED ORDER — CHLORDIAZEPOXIDE HCL 5 MG PO CAPS
25.0000 mg | ORAL_CAPSULE | Freq: Every day | ORAL | Status: AC
Start: 2023-07-19 — End: 2023-07-20
  Administered 2023-07-19: 25 mg via ORAL
  Filled 2023-07-16: qty 5

## 2023-07-16 MED ORDER — MORPHINE SULFATE (PF) 4 MG/ML IV SOLN
4.0000 mg | Freq: Once | INTRAVENOUS | Status: AC
  Administered 2023-07-16: 4 mg via INTRAVENOUS
  Filled 2023-07-16: qty 1

## 2023-07-16 MED ORDER — PANTOPRAZOLE SODIUM 40 MG IV SOLR
80.0000 mg | Freq: Once | INTRAVENOUS | Status: AC
  Administered 2023-07-16: 80 mg via INTRAVENOUS
  Filled 2023-07-16: qty 20

## 2023-07-16 MED ORDER — HYDROMORPHONE HCL 1 MG/ML IJ SOLN
0.5000 mg | INTRAMUSCULAR | Status: DC | PRN
  Administered 2023-07-16 – 2023-07-20 (×13): 0.5 mg via INTRAVENOUS
  Filled 2023-07-16 (×13): qty 0.5

## 2023-07-16 MED ORDER — CHLORDIAZEPOXIDE HCL 5 MG PO CAPS
25.0000 mg | ORAL_CAPSULE | ORAL | Status: AC
  Administered 2023-07-18 – 2023-07-19 (×2): 25 mg via ORAL
  Filled 2023-07-16 (×2): qty 5

## 2023-07-16 MED ORDER — POLYETHYLENE GLYCOL 3350 17 G PO PACK
17.0000 g | PACK | Freq: Every day | ORAL | Status: DC | PRN
  Administered 2023-07-20: 17 g via ORAL
  Filled 2023-07-16: qty 1

## 2023-07-16 MED ORDER — ONDANSETRON HCL 4 MG/2ML IJ SOLN
4.0000 mg | Freq: Four times a day (QID) | INTRAMUSCULAR | Status: DC | PRN
  Administered 2023-07-18: 4 mg via INTRAVENOUS
  Filled 2023-07-16: qty 2

## 2023-07-16 MED ORDER — PANTOPRAZOLE SODIUM 40 MG IV SOLR
40.0000 mg | Freq: Two times a day (BID) | INTRAVENOUS | Status: DC
  Administered 2023-07-16 – 2023-07-18 (×5): 40 mg via INTRAVENOUS
  Filled 2023-07-16 (×5): qty 10

## 2023-07-16 MED ORDER — CHLORDIAZEPOXIDE HCL 5 MG PO CAPS
25.0000 mg | ORAL_CAPSULE | Freq: Four times a day (QID) | ORAL | Status: AC
  Administered 2023-07-16 – 2023-07-17 (×4): 25 mg via ORAL
  Filled 2023-07-16 (×2): qty 5
  Filled 2023-07-16: qty 1
  Filled 2023-07-16: qty 5

## 2023-07-16 MED ORDER — ACETAMINOPHEN 325 MG PO TABS
650.0000 mg | ORAL_TABLET | Freq: Four times a day (QID) | ORAL | Status: DC | PRN
  Administered 2023-07-17: 650 mg via ORAL
  Filled 2023-07-16: qty 2

## 2023-07-16 MED ORDER — IOHEXOL 350 MG/ML SOLN
75.0000 mL | Freq: Once | INTRAVENOUS | Status: AC | PRN
  Administered 2023-07-16: 75 mL via INTRAVENOUS

## 2023-07-16 MED ORDER — SODIUM CHLORIDE 0.9 % IV SOLN: INTRAVENOUS | Status: AC

## 2023-07-16 MED ORDER — ONDANSETRON HCL 4 MG/2ML IJ SOLN
4.0000 mg | Freq: Once | INTRAMUSCULAR | Status: AC
  Administered 2023-07-16: 4 mg via INTRAVENOUS
  Filled 2023-07-16: qty 2

## 2023-07-16 MED ORDER — CHLORDIAZEPOXIDE HCL 25 MG PO CAPS
25.0000 mg | ORAL_CAPSULE | Freq: Once | ORAL | Status: AC
Start: 2023-07-16 — End: 2023-07-16
  Administered 2023-07-16: 25 mg via ORAL
  Filled 2023-07-16: qty 1

## 2023-07-16 MED ORDER — ALUM & MAG HYDROXIDE-SIMETH 200-200-20 MG/5ML PO SUSP
30.0000 mL | Freq: Once | ORAL | Status: AC
  Administered 2023-07-16: 30 mL via ORAL
  Filled 2023-07-16: qty 30

## 2023-07-16 MED ORDER — FENTANYL CITRATE PF 50 MCG/ML IJ SOSY
50.0000 ug | PREFILLED_SYRINGE | Freq: Once | INTRAMUSCULAR | Status: AC
  Administered 2023-07-16: 50 ug via INTRAVENOUS
  Filled 2023-07-16: qty 1

## 2023-07-16 MED ORDER — ADULT MULTIVITAMIN W/MINERALS CH
1.0000 | ORAL_TABLET | Freq: Every day | ORAL | Status: DC
  Administered 2023-07-16 – 2023-07-20 (×5): 1 via ORAL
  Filled 2023-07-16 (×5): qty 1

## 2023-07-16 MED ORDER — HYDROXYZINE HCL 25 MG PO TABS
25.0000 mg | ORAL_TABLET | Freq: Four times a day (QID) | ORAL | Status: AC | PRN
  Administered 2023-07-19: 25 mg via ORAL
  Filled 2023-07-16: qty 1

## 2023-07-16 MED ORDER — CHLORDIAZEPOXIDE HCL 5 MG PO CAPS: 25.0000 mg | ORAL_CAPSULE | Freq: Four times a day (QID) | ORAL | Status: AC | PRN

## 2023-07-16 MED ORDER — FOLIC ACID 1 MG PO TABS
1.0000 mg | ORAL_TABLET | Freq: Every day | ORAL | Status: DC
  Administered 2023-07-16 – 2023-07-20 (×5): 1 mg via ORAL
  Filled 2023-07-16 (×5): qty 1

## 2023-07-16 NOTE — ED Notes (Signed)
ED TO INPATIENT HANDOFF REPORT  ED Nurse Name and Phone #: [phone]  S Name/Age/Gender [name] 62 y.o. male Room/Bed: 007C/007C  Code Status   Code Status: Full Code  Home/SNF/Other Home Patient oriented to: self, place, time, and situation Is this baseline? Yes   Triage Complete: Triage complete  Chief Complaint GI bleed [K92.2]  Triage Note Pt [facility] from home for Abd pain 10/10.  Saw PCP for this last week.  Onset about 1700 yesterday, didn't sleep well. Pain radiates to pelvis/groin region.  EMS endorses distention and emesis en route. Pt takes Eliquis.    166/88  94 RR 24 94%RA CBG 94   Allergies Allergies  Allergen Reactions   Pravastatin Itching   Simvastatin Itching    Level of Care/Admitting Diagnosis ED Disposition     ED Disposition  Admit   Condition  --   Comment  Hospital Area: [facility] [[id number]]  Level of Care: Telemetry Medical [104]  May admit patient to [facility] or [facility] if equivalent level of care is available:: No  Covid Evaluation: Asymptomatic - no recent exposure (last 10 days) testing not required  Diagnosis: GI bleed [248157]  Admitting Physician: Synetta Fail [[id number]]  Attending Physician: Synetta Fail [phone]  Certification:: I certify this patient will need inpatient services for at least 2 midnights  Expected Medical Readiness: 07/19/2023          B Medical/Surgery History Past Medical History:  Diagnosis Date   Chest pain    stres test neg x 2, cath 5/12; minimal LAD irregs; no obs CAD; normal LVF   Clotting disorder (HCC)    dvt   Deep vein thrombosis (HCC) 10/06/2010   GERD (gastroesophageal reflux disease)    History of DVT of lower extremity    on chronic coumadin   Hypertension    Internal hemorrhoids    Cscope 2007   PAD (peripheral artery disease) (HCC)    a. left leg ischemia tx wtih embolectomy of left fem, pop, tib arteries with Dr. Edilia Bo -  08/2006;  b. known occlusion of right pop with collats - med Rx;  c.ABI's 8/09: R 1.0; L 0.91    PFO (patent foramen ovale)    per 08/2006 discharge summary, TEE showed EF 60%, small PFO with minimal right-to-left shunt with Valsalva; not felt to be the source of emboli, lifelong Coumadin recommended   Pneumonia    history of   Polysubstance abuse (HCC)    marijuana only   Renal infarct [facility])    R   Past Surgical History:  Procedure Laterality Date   CHOLECYSTECTOMY     COLONOSCOPY     INGUINAL HERNIA REPAIR Left 07/16/2019   Procedure: LAPAROSCOPIC LEFT INGUINAL HERNIA REPAIR WITH MESH;  Surgeon: Axel Filler, MD;  [facility];  Service: General;  Laterality: Left;   left leg blood clot removal 2009  2009   TONSILLECTOMY     TOTAL HIP ARTHROPLASTY Left 02/03/2023   Procedure: LEFT TOTAL HIP ARTHROPLASTY ANTERIOR APPROACH;  Surgeon: Tarry Kos, MD;  Location: [facility] OR;  Service: Orthopedics;  Laterality: Left;  3-C   WRIST SURGERY Left      A IV Location/Drains/Wounds Patient Lines/Drains/Airways Status     Active Line/Drains/Airways     Name Placement date Placement time Site Days   Peripheral IV 07/16/23 20 G Posterior;Right Forearm 07/16/23  0913  Forearm  less than 1   Incision - 3 Ports Abdomen  1: Umbilicus 2: Upper;Mid 3: Right;Lower;Lateral 07/16/19  1048  -- 1461            Intake/Output Last 24 hours No intake or output data in the 24 hours ending 07/16/23 1647  Labs/Imaging Results for orders placed or performed during the hospital encounter of 07/16/23 (from the past 48 hour(s))  CBC with Differential     Status: Abnormal   Collection Time: 07/16/23  9:18 AM  Result Value Ref Range   WBC 12.4 (H) 4.0 - 10.5 K/uL   RBC 4.77 4.22 - 5.81 MIL/uL   Hemoglobin 15.7 13.0 - 17.0 g/dL   HCT 02.7 25.3 - 66.4 %   MCV 97.3 80.0 - 100.0 fL   MCH 32.9 26.0 - 34.0 pg   MCHC 33.8 30.0 - 36.0 g/dL   RDW 40.3 47.4 - 25.9 %   Platelets 309 150 - 400 K/uL   nRBC  0.0 0.0 - 0.2 %   Neutrophils Relative % 86 %   Neutro Abs 10.6 (H) 1.7 - 7.7 K/uL   Lymphocytes Relative 10 %   Lymphs Abs 1.2 0.7 - 4.0 K/uL   Monocytes Relative 4 %   Monocytes Absolute 0.5 0.1 - 1.0 K/uL   Eosinophils Relative 0 %   Eosinophils Absolute 0.1 0.0 - 0.5 K/uL   Basophils Relative 0 %   Basophils Absolute 0.0 0.0 - 0.1 K/uL   Immature Granulocytes 0 %   Abs Immature Granulocytes 0.03 0.00 - 0.07 K/uL    Comment: Performed at [facility], [address], [place], [place] [address]  Comprehensive metabolic panel     Status: Abnormal   Collection Time: 07/16/23  9:18 AM  Result Value Ref Range   Sodium 136 135 - 145 mmol/L   Potassium 3.4 (L) 3.5 - 5.1 mmol/L   Chloride 101 98 - 111 mmol/L   CO2 20 (L) 22 - 32 mmol/L   Glucose, Bld 96 70 - 99 mg/dL    Comment: Glucose reference range applies only to samples taken after fasting for at least 8 hours.   BUN 12 6 - 20 mg/dL   Creatinine, Ser 5.64 0.61 - 1.24 mg/dL   Calcium 9.9 8.9 - 33.2 mg/dL   Total Protein 7.1 6.5 - 8.1 g/dL   Albumin 4.2 3.5 - 5.0 g/dL   AST 33 15 - 41 U/L   ALT 33 0 - 44 U/L   Alkaline Phosphatase 90 38 - 126 U/L   Total Bilirubin 1.1 <1.2 mg/dL   GFR, Estimated >95 >18 mL/min    Comment: (NOTE) Calculated using the CKD-EPI Creatinine Equation (2021)    Anion gap 15 5 - 15    Comment: Performed at [facility], [address] [place], [place] [address]  Lipase, blood     Status: None   Collection Time: 07/16/23  9:18 AM  Result Value Ref Range   Lipase 30 11 - 51 U/L    Comment: Performed at [facility], [address] [place], [place] [address]  APTT     Status: None   Collection Time: 07/16/23  9:18 AM  Result Value Ref Range   aPTT 25 24 - 36 seconds    Comment: Performed at [facility], [address], [place], [place] [address]   CT ABDOMEN PELVIS W CONTRAST  Result Date: 07/16/2023 CLINICAL DATA:  Abdominal pain. Pain radiates to pelvis/groin region.  Abdominal distension and emesis. * Tracking Code: BO *  EXAM: CT ABDOMEN AND PELVIS WITH CONTRAST TECHNIQUE: Multidetector CT imaging of the abdomen and pelvis was performed using the standard protocol following bolus administration of intravenous contrast. RADIATION DOSE REDUCTION: This exam was performed according to the departmental dose-optimization program which includes automated exposure control, adjustment of the mA and/or kV according to patient size and/or use of iterative reconstruction technique. CONTRAST:  75mL OMNIPAQUE IOHEXOL 350 MG/ML SOLN COMPARISON:  07/12/2015 FINDINGS: Lower chest: No acute abnormality. Chronic postinflammatory changes within the periphery of the lung bases with subpleural reticulation and banding. Centrilobular emphysema. Hepatobiliary: Multiple subcapsular hypodensities are identified along the inferior margin of the liver which are new when compared with previous exam, image 69/6, image 64/6 and image 79/6. The largest is along the inferior margin of the right lobe measuring 1.8 x 1.5 cm, image 20 8/3. No distinct soft tissue nodule or mass identified. Status post cholecystectomy. No significant bile duct dilatation. Pancreas: Unremarkable. No pancreatic ductal dilatation or surrounding inflammatory changes. Spleen: Normal in size without focal abnormality. Adrenals/Urinary Tract: Normal adrenal glands. No nephrolithiasis or obstructive uropathy identified bilaterally. Small subcentimeter hypodense kidney lesions are identified which are technically too small to characterize and compatible with Bosniak class 2 lesions. No follow-up imaging recommended. Scarring is noted along the posterior cortex of the right kidney. Urinary bladder appears normal. Stomach/Bowel: Increased wall thickening within the gastric antrum and pylorus is identified when compared with the examination from 07/12/2015. This is suboptimally visualized without intraluminal contrast material. The appendix is  visualized and appears normal. No pathologic dilatation of the large or small bowel loops. Colonic diverticulosis without signs of acute diverticulitis. No bowel wall thickening or inflammation. Vascular/Lymphatic: Aortic atherosclerosis. No aneurysm. Patent portal vein and upper abdominal vascularity. No signs abdominopelvic adenopathy. Reproductive: Prostate is unremarkable. Other: There is a small to moderate volume of ascites. There is diffuse soft tissue infiltration and haziness identified throughout the omentum and upper abdominal peritoneal fat, image 88/6 and image 33/3. Musculoskeletal: Previous left hip arthroplasty. No aggressive lytic or sclerotic bone lesions. Bilateral L5 pars defects identified. IMPRESSION: 1. There is a small to moderate volume of ascites. There is diffuse soft tissue infiltration and haziness identified throughout the omentum and upper abdominal peritoneal fat. The diagnosis of exclusion would be peritoneal carcinomatosis. 2. Multiple subcapsular hypodensities are identified along the inferior margin of the liver which are new when compared with previous exam. The largest is along the inferior margin of the right lobe measuring 1.8 x 1.5 cm. No distinct soft tissue nodule or mass identified. In the setting of peritoneal disease 1 subcapsular implants cannot be excluded. More definitive characterization may be of chain with contrast enhanced liver MRI. 3. Increased wall thickening within the gastric antrum and pylorus is identified when compared with the examination from 07/12/2015. This is suboptimally visualized without intraluminal contrast material. This may also be better assessed with contrast enhanced MRI versus direct visualization. 4. Colonic diverticulosis without signs of acute diverticulitis. 5. Aortic Atherosclerosis (ICD10-I70.0) and Emphysema (ICD10-J43.9). Electronically Signed   By: Signa Kell M.D.   On: 07/16/2023 10:53    Pending Labs Unresulted Labs (From  admission, onward)     Start     Ordered   07/17/23 0500  Comprehensive metabolic panel  Tomorrow morning,   R        07/16/23 1323   07/17/23 0500  CBC  Tomorrow morning,   R        07/16/23 1323   07/16/23 1405  Type  and screen [facility]  Once,   R       Comments: [facility]    07/16/23 1404   07/16/23 1322  Magnesium  Add-on,   AD        07/16/23 1323   07/16/23 1322  Phosphorus  Add-on,   AD        07/16/23 1323   07/16/23 1320  HIV Antibody (routine testing w rflx)  (HIV Antibody (Routine testing w reflex) panel)  Once,   R        07/16/23 1323            Vitals/Pain Today's Vitals   07/16/23 1302 07/16/23 1315 07/16/23 1525 07/16/23 1640  BP:  108/74  118/86  Pulse:  (!) 109  91  Resp:  19  (!) 21  Temp: 98.8 F (37.1 C)   99.5 F (37.5 C)  TempSrc: Oral   Oral  SpO2:  99%  100%  Weight:      Height:      PainSc:   Asleep     Isolation Precautions No active isolations  Medications Medications  sodium chloride flush (NS) 0.9 % injection 3 mL (3 mLs Intravenous Not Given 07/16/23 1444)  acetaminophen (TYLENOL) tablet 650 mg (has no administration in time range)    Or  acetaminophen (TYLENOL) suppository 650 mg (has no administration in time range)  polyethylene glycol (MIRALAX / GLYCOLAX) packet 17 g (has no administration in time range)  multivitamin with minerals tablet 1 tablet (1 tablet Oral Given 07/16/23 1440)  chlordiazePOXIDE (LIBRIUM) capsule 25 mg (has no administration in time range)  hydrOXYzine (ATARAX) tablet 25 mg (has no administration in time range)  chlordiazePOXIDE (LIBRIUM) capsule 25 mg (25 mg Oral Given 07/16/23 1444)    Followed by  chlordiazePOXIDE (LIBRIUM) capsule 25 mg (has no administration in time range)    Followed by  chlordiazePOXIDE (LIBRIUM) capsule 25 mg (has no administration in time range)    Followed by  chlordiazePOXIDE (LIBRIUM) capsule 25 mg (has no administration in time range)   ondansetron (ZOFRAN) injection 4 mg (has no administration in time range)  0.9 %  sodium chloride infusion ( Intravenous New Bag/Given 07/16/23 1444)  thiamine (VITAMIN B1) injection 100 mg (100 mg Intravenous Given 07/16/23 1442)  folic acid (FOLVITE) tablet 1 mg (1 mg Oral Given 07/16/23 1440)  HYDROmorphone (DILAUDID) injection 0.5 mg (has no administration in time range)  pantoprazole (PROTONIX) injection 40 mg (has no administration in time range)  fentaNYL (SUBLIMAZE) injection 50 mcg (50 mcg Intravenous Given 07/16/23 0914)  ondansetron (ZOFRAN) injection 4 mg (4 mg Intravenous Given 07/16/23 0915)  iohexol (OMNIPAQUE) 350 MG/ML injection 75 mL (75 mLs Intravenous Contrast Given 07/16/23 1010)  pantoprazole (PROTONIX) injection 80 mg (80 mg Intravenous Given 07/16/23 1127)  alum & mag hydroxide-simeth (MAALOX/MYLANTA) 200-200-20 MG/5ML suspension 30 mL (30 mLs Oral Given 07/16/23 1130)  morphine (PF) 4 MG/ML injection 4 mg (4 mg Intravenous Given 07/16/23 1126)  chlordiazePOXIDE (LIBRIUM) capsule 25 mg (25 mg Oral Given 07/16/23 1440)    Mobility walks     Focused Assessments Cardiac Assessment Handoff:    Lab Results  Component Value Date   CKTOTAL 210 12/02/2010   CKMB 1.4 12/02/2010   TROPONINI 0.01        NO INDICATION OF MYOCARDIAL INJURY. 12/02/2010   No results found for: "DDIMER" Does the Patient currently have chest pain? No    R Recommendations: See Admitting Provider  Note  Report given to:   Additional Notes: Pt is pleasant, alert and oriented. Family at bedside.  Sleeping comfortably

## 2023-07-16 NOTE — ED Triage Notes (Signed)
Pt [facility] from home for Abd pain 10/10.  Saw PCP for this last week.  Onset about 1700 yesterday, didn't sleep well. Pain radiates to pelvis/groin region.  EMS endorses distention and emesis en route. Pt takes Eliquis.    166/88  94 RR 24 94%RA CBG 94

## 2023-07-16 NOTE — ED Provider Notes (Signed)
[place] EMERGENCY DEPARTMENT AT [facility] Provider Note   CSN: [id number] Arrival date & time: 07/16/23  3664     History  No chief complaint on file.   [name] is a 62 y.o. male.    Pt comes in with cc of abdominal pain. Patient has previous history of hypertension, GERD, GI bleed.  He indicates that his been having abdominal pain for the last several days.  He gone to his PCP because every time he coughed, he was having pain shooting down his abdomen towards the rectal area.  More recently he has had worsening abdominal pain and the when he eats/drinks, he often throws of thereafter.  Patient does admit to drinking fifth of liquor every day.  Patient has had both upper and lower endoscopy in the past, but there was no concerning findings.  He indicates that he has lost about 15 to 20 pounds in the last 6 months.  He has no night sweats. No history of abdominal surgeries.  Home Medications Prior to Admission medications   Medication Sig Start Date End Date Taking? Authorizing Provider  amLODipine (NORVASC) 5 MG tablet Take 1 tablet (5 mg total) by mouth daily. 12/19/22  Yes Paz, Nolon Rod, MD  apixaban Everlene Balls) 5 MG TABS tablet Take 1 tablet by mouth twice daily 04/21/23  Yes [name], MD  atorvastatin (LIPITOR) 40 MG tablet Take 1 tablet by mouth once daily 05/23/23  Yes [name], MD  EPINEPHrine (EPIPEN 2-PAK) 0.3 mg/0.3 mL IJ SOAJ injection Inject 0.3 mg into the muscle as needed for anaphylaxis. 12/19/22  Yes Paz, Nolon Rod, MD  sulfamethoxazole-trimethoprim (BACTRIM) 400-80 MG tablet Take 1 tablet by mouth 2 (two) times daily.   Yes [provider]  docusate sodium (COLACE) 100 MG capsule Take 1 capsule (100 mg total) by mouth daily as needed. Patient not taking: Reported on 07/16/2023 01/30/23 01/30/24  Cristie Hem, PA-C  famotidine (PEPCID) 40 MG tablet Take 1 tablet (40 mg total) by mouth daily. Patient not taking: Reported on  07/16/2023 12/19/22   Wanda Plump, MD  methocarbamol (ROBAXIN-750) 750 MG tablet Take 1 tablet (750 mg total) by mouth 2 (two) times daily as needed for muscle spasms. Patient not taking: Reported on 07/16/2023 01/30/23   Cristie Hem, PA-C      Allergies    Pravastatin and Simvastatin    Review of Systems   Review of Systems  Physical Exam Updated Vital Signs BP 108/74   Pulse (!) 109   Temp 98.8 F (37.1 C) (Oral)   Resp 19   Ht 6\' 1"  (1.854 m)   Wt 79.4 kg   SpO2 99%   BMI 23.09 kg/m  Physical Exam Vitals and nursing note reviewed.  Constitutional:      Appearance: He is well-developed.  HENT:     Head: Atraumatic.  Cardiovascular:     Rate and Rhythm: Normal rate.  Pulmonary:     Effort: Pulmonary effort is normal.  Abdominal:     Tenderness: There is abdominal tenderness.     Comments: Generalized abdominal tenderness, worst over the epigastric region  Musculoskeletal:     Cervical back: Neck supple.  Skin:    General: Skin is warm.  Neurological:     Mental Status: He is alert and oriented to person, place, and time.     ED Results / Procedures / Treatments   Labs (all labs ordered are listed, but only abnormal results are  displayed) Labs Reviewed  CBC WITH DIFFERENTIAL/PLATELET - Abnormal; Notable for the following components:      Result Value   WBC 12.4 (*)    Neutro Abs 10.6 (*)    All other components within normal limits  COMPREHENSIVE METABOLIC PANEL - Abnormal; Notable for the following components:   Potassium 3.4 (*)    CO2 20 (*)    All other components within normal limits  LIPASE, BLOOD  APTT  HIV ANTIBODY (ROUTINE TESTING W REFLEX)  MAGNESIUM  PHOSPHORUS    EKG None  Radiology CT ABDOMEN PELVIS W CONTRAST  Result Date: 07/16/2023 CLINICAL DATA:  Abdominal pain. Pain radiates to pelvis/groin region. Abdominal distension and emesis. * Tracking Code: BO * EXAM: CT ABDOMEN AND PELVIS WITH CONTRAST TECHNIQUE: Multidetector CT  imaging of the abdomen and pelvis was performed using the standard protocol following bolus administration of intravenous contrast. RADIATION DOSE REDUCTION: This exam was performed according to the departmental dose-optimization program which includes automated exposure control, adjustment of the mA and/or kV according to patient size and/or use of iterative reconstruction technique. CONTRAST:  75mL OMNIPAQUE IOHEXOL 350 MG/ML SOLN COMPARISON:  07/12/2015 FINDINGS: Lower chest: No acute abnormality. Chronic postinflammatory changes within the periphery of the lung bases with subpleural reticulation and banding. Centrilobular emphysema. Hepatobiliary: Multiple subcapsular hypodensities are identified along the inferior margin of the liver which are new when compared with previous exam, image 69/6, image 64/6 and image 79/6. The largest is along the inferior margin of the right lobe measuring 1.8 x 1.5 cm, image 20 8/3. No distinct soft tissue nodule or mass identified. Status post cholecystectomy. No significant bile duct dilatation. Pancreas: Unremarkable. No pancreatic ductal dilatation or surrounding inflammatory changes. Spleen: Normal in size without focal abnormality. Adrenals/Urinary Tract: Normal adrenal glands. No nephrolithiasis or obstructive uropathy identified bilaterally. Small subcentimeter hypodense kidney lesions are identified which are technically too small to characterize and compatible with Bosniak class 2 lesions. No follow-up imaging recommended. Scarring is noted along the posterior cortex of the right kidney. Urinary bladder appears normal. Stomach/Bowel: Increased wall thickening within the gastric antrum and pylorus is identified when compared with the examination from 07/12/2015. This is suboptimally visualized without intraluminal contrast material. The appendix is visualized and appears normal. No pathologic dilatation of the large or small bowel loops. Colonic diverticulosis without  signs of acute diverticulitis. No bowel wall thickening or inflammation. Vascular/Lymphatic: Aortic atherosclerosis. No aneurysm. Patent portal vein and upper abdominal vascularity. No signs abdominopelvic adenopathy. Reproductive: Prostate is unremarkable. Other: There is a small to moderate volume of ascites. There is diffuse soft tissue infiltration and haziness identified throughout the omentum and upper abdominal peritoneal fat, image 88/6 and image 33/3. Musculoskeletal: Previous left hip arthroplasty. No aggressive lytic or sclerotic bone lesions. Bilateral L5 pars defects identified. IMPRESSION: 1. There is a small to moderate volume of ascites. There is diffuse soft tissue infiltration and haziness identified throughout the omentum and upper abdominal peritoneal fat. The diagnosis of exclusion would be peritoneal carcinomatosis. 2. Multiple subcapsular hypodensities are identified along the inferior margin of the liver which are new when compared with previous exam. The largest is along the inferior margin of the right lobe measuring 1.8 x 1.5 cm. No distinct soft tissue nodule or mass identified. In the setting of peritoneal disease 1 subcapsular implants cannot be excluded. More definitive characterization may be of chain with contrast enhanced liver MRI. 3. Increased wall thickening within the gastric antrum and pylorus is identified when compared with  the examination from 07/12/2015. This is suboptimally visualized without intraluminal contrast material. This may also be better assessed with contrast enhanced MRI versus direct visualization. 4. Colonic diverticulosis without signs of acute diverticulitis. 5. Aortic Atherosclerosis (ICD10-I70.0) and Emphysema (ICD10-J43.9). Electronically Signed   By: Signa Kell M.D.   On: 07/16/2023 10:53    Procedures Procedures    Medications Ordered in ED Medications  sodium chloride flush (NS) 0.9 % injection 3 mL (has no administration in time range)   acetaminophen (TYLENOL) tablet 650 mg (has no administration in time range)    Or  acetaminophen (TYLENOL) suppository 650 mg (has no administration in time range)  polyethylene glycol (MIRALAX / GLYCOLAX) packet 17 g (has no administration in time range)  thiamine (VITAMIN B1) injection 100 mg (has no administration in time range)  multivitamin with minerals tablet 1 tablet (has no administration in time range)  chlordiazePOXIDE (LIBRIUM) capsule 25 mg (has no administration in time range)  hydrOXYzine (ATARAX) tablet 25 mg (has no administration in time range)  chlordiazePOXIDE (LIBRIUM) capsule 25 mg (has no administration in time range)  chlordiazePOXIDE (LIBRIUM) capsule 25 mg (has no administration in time range)    Followed by  chlordiazePOXIDE (LIBRIUM) capsule 25 mg (has no administration in time range)    Followed by  chlordiazePOXIDE (LIBRIUM) capsule 25 mg (has no administration in time range)    Followed by  chlordiazePOXIDE (LIBRIUM) capsule 25 mg (has no administration in time range)  ondansetron (ZOFRAN) injection 4 mg (has no administration in time range)  0.9 %  sodium chloride infusion (has no administration in time range)  fentaNYL (SUBLIMAZE) injection 50 mcg (50 mcg Intravenous Given 07/16/23 0914)  ondansetron (ZOFRAN) injection 4 mg (4 mg Intravenous Given 07/16/23 0915)  iohexol (OMNIPAQUE) 350 MG/ML injection 75 mL (75 mLs Intravenous Contrast Given 07/16/23 1010)  pantoprazole (PROTONIX) injection 80 mg (80 mg Intravenous Given 07/16/23 1127)  alum & mag hydroxide-simeth (MAALOX/MYLANTA) 200-200-20 MG/5ML suspension 30 mL (30 mLs Oral Given 07/16/23 1130)  morphine (PF) 4 MG/ML injection 4 mg (4 mg Intravenous Given 07/16/23 1126)    ED Course/ Medical Decision Making/ A&P                                 Medical Decision Making Risk OTC drugs. Prescription drug management.   This patient presents to the ED with chief complaint(s) of abdominal pain with  pertinent past medical history of regular alcohol consumption, esophagitis.The complaint involves an extensive differential diagnosis and also carries with it a high risk of complications and morbidity.    The differential diagnosis includes : Pancreatitis, gastritis/PUD, perforated ulcer, intra-abdominal bleeding, mesenteric ischemia, small bowel obstruction, ileus.  Patient initially got CT scan of the abdomen along with basic labs.    Additional history obtained: Records reviewed Primary Care Documents  Independent labs interpretation:  The following labs were independently interpreted: CBC is reassuring.  LFTs, lipase normal.  Independent visualization and interpretation of imaging: - I independently visualized the following imaging with scope of interpretation limited to determining acute life threatening conditions related to emergency care: CT abdomen pelvis, which revealed no evidence of perforation, obstruction.  Treatment and Reassessment: CT scan has some ambiguous findings such as esophagitis, liver lesions, ascites.  Differential now expands to conditions like tumor.   Consultation: - Consulted or discussed management/test interpretation with external professional: GI, spoke with Dr. Marina Goodell.  He recommends that patient be  admitted to the hospital for additional workup.  Final Clinical Impression(s) / ED Diagnoses Final diagnoses:  Acute gastritis without hemorrhage, unspecified gastritis type  Other ascites    Rx / DC Orders ED Discharge Orders     None         Derwood Kaplan, MD 07/16/23 1411

## 2023-07-16 NOTE — Plan of Care (Signed)

## 2023-07-16 NOTE — ED Provider Triage Note (Signed)
Emergency Medicine Provider Triage Evaluation Note  [name] , a 62 y.o. male  was evaluated in triage.  Pt complains of generalized abdominal pain that started last night back suddenly 5 PM.  Pain is constant.  Also reports some feelings of nausea and an episode of vomiting earlier today.  Denies any fever or chills.  Only previous abdominal surgery was a hernia repair.  Review of Systems  Positive: As above Negative: As above  Physical Exam  BP (!) 146/106 (BP Location: Left Arm)   Pulse (!) 112   Resp 18   Ht 6\' 1"  (1.854 m)   Wt 79.4 kg   SpO2 92%   BMI 23.09 kg/m  Gen:   Awake, no distress   Resp:  Normal effort  MSK:   Moves extremities without difficulty  Other:  Diffusely tender to light palpation of the abdomen  Medical Decision Making  Medically screening exam initiated at 8:59 AM.  Appropriate orders placed.  Dereck Leep was informed that the remainder of the evaluation will be completed by another provider, this initial triage assessment does not replace that evaluation, and the importance of remaining in the ED until their evaluation is complete.     Arabella Merles, PA-C 07/16/23 [phone]

## 2023-07-16 NOTE — H&P (Signed)
History and Physical   [name] YQM:[id number] DOB: 1960-12-19 DOA: 07/16/2023  PCP: Wanda Plump, MD   Patient coming from: Home  Chief Complaint: Abdominal pain  HPI: [name] is a 61 y.o. male with medical history significant of hypertension, hyperlipidemia, GERD, PAD, DVT, alcohol use presenting with abdominal pain.  Patient has had ongoing abdominal pain for the past couple weeks.  Saw his PCP for this at which point he is having issues with some dysuria as well as some increased pain with Valsalva such as cough.  PCP ruled out UTI with urine testing and prescribed laxative.  Had some initial improvement per chart review.  No  Today he woke up around 2 AM with 10 out of 10 abdominal pain.  Has had some nausea and vomiting as well recently and in around by EMS he had some coffee-ground emesis.  Denies any dark or bloody stools recently but did have some blood in his stools few months ago.  He is on Eliquis for history of DVT.  Denies fevers, chills, chest pain, shortness of breath.   ED Course: Vital signs in the ED notable for blood pressure in the 110s to 140 systolic, heart rate in the 90s to 120s, respiratory in the 20s.  Lab workup included CMP with potassium 3.4, bicarb 20.  CBC showed leukocytosis to 12.4.  PT PTT normal.  Lipase normal.  CT the abdomen pelvis shows small to moderate ascites with diffuse soft tissue inflammation with diagnosis of exclusion being peritoneal carcinomatosis.  Also noted with subscapular lesions of the liver recommending MRI to clarify as well as some wall thickening of the stomach which MRI could further clarify as well.  Diverticulosis without diverticulitis also noted.  Patient received morphine, fentanyl, Zofran, Maalox in the ED.  Started on IV PPI.  GI consulted recommending diagnostic paracentesis if able, n.p.o., IV PPI, as needed antiemetics.   Review of Systems: As per HPI otherwise all other systems reviewed and are  negative.  Past Medical History:  Diagnosis Date   Chest pain    stres test neg x 2, cath 5/12; minimal LAD irregs; no obs CAD; normal LVF   Clotting disorder (HCC)    dvt   Deep vein thrombosis (HCC) 10/06/2010   GERD (gastroesophageal reflux disease)    History of DVT of lower extremity    on chronic coumadin   Hypertension    Internal hemorrhoids    Cscope 2007   PAD (peripheral artery disease) (HCC)    a. left leg ischemia tx wtih embolectomy of left fem, pop, tib arteries with Dr. Edilia Bo - 08/2006;  b. known occlusion of right pop with collats - med Rx;  c.ABI's 8/09: R 1.0; L 0.91    PFO (patent foramen ovale)    per 08/2006 discharge summary, TEE showed EF 60%, small PFO with minimal right-to-left shunt with Valsalva; not felt to be the source of emboli, lifelong Coumadin recommended   Pneumonia    history of   Polysubstance abuse (HCC)    marijuana only   Renal infarct [facility])    R    Past Surgical History:  Procedure Laterality Date   CHOLECYSTECTOMY     COLONOSCOPY     INGUINAL HERNIA REPAIR Left 07/16/2019   Procedure: LAPAROSCOPIC LEFT INGUINAL HERNIA REPAIR WITH MESH;  Surgeon: Axel Filler, MD;  Location: [facility] OR;  Service: General;  Laterality: Left;   left leg blood clot removal 2009  2009   TONSILLECTOMY  TOTAL HIP ARTHROPLASTY Left 02/03/2023   Procedure: LEFT TOTAL HIP ARTHROPLASTY ANTERIOR APPROACH;  Surgeon: Tarry Kos, MD;  Location: [facility] OR;  Service: Orthopedics;  Laterality: Left;  3-C   WRIST SURGERY Left     Social History  reports that he has been smoking cigarettes. He has a 30 pack-year smoking history. He has never used smokeless tobacco. He reports current alcohol use of about 21.0 standard drinks of alcohol per week. He reports current drug use. Drug: Marijuana.  Allergies  Allergen Reactions   Pravastatin Itching   Simvastatin Itching   Lisinopril Swelling    Tongue swelling    Family History  Problem Relation Age of Onset    Hypertension Mother    Breast cancer Mother    Hypertension Father    CAD Father    Diabetes Father    Lung cancer Maternal Uncle    Pancreatic cancer Brother    Prostate cancer Maternal Uncle    Heart disease Paternal Grandmother    Colon cancer Neg Hx    Stomach cancer Neg Hx    Esophageal cancer Neg Hx    Rectal cancer Neg Hx   Reviewed on admission  Prior to Admission medications   Medication Sig Start Date End Date Taking? Authorizing Provider  amLODipine (NORVASC) 5 MG tablet Take 1 tablet (5 mg total) by mouth daily. 12/19/22   Wanda Plump, MD  apixaban Everlene Balls) 5 MG TABS tablet Take 1 tablet by mouth twice daily 04/21/23   [name], MD  atorvastatin (LIPITOR) 40 MG tablet Take 1 tablet by mouth once daily 05/23/23   [name], MD  docusate sodium (COLACE) 100 MG capsule Take 1 capsule (100 mg total) by mouth daily as needed. 01/30/23 01/30/24  Cristie Hem, PA-C  EPINEPHrine (EPIPEN 2-PAK) 0.3 mg/0.3 mL IJ SOAJ injection Inject 0.3 mg into the muscle as needed for anaphylaxis. 12/19/22   Wanda Plump, MD  esomeprazole (NEXIUM) 20 MG packet Take 20 mg by mouth daily before breakfast.    [provider]  famotidine (PEPCID) 40 MG tablet Take 1 tablet (40 mg total) by mouth daily. 12/19/22   Wanda Plump, MD  fexofenadine (ALLEGRA) 180 MG tablet Take 180 mg by mouth daily.    [provider]  methocarbamol (ROBAXIN-750) 750 MG tablet Take 1 tablet (750 mg total) by mouth 2 (two) times daily as needed for muscle spasms. 01/30/23   Cristie Hem, PA-C  potassium chloride (KLOR-CON) 10 MEQ tablet Take 40 mEq by mouth 2 (two) times daily. 01/30/23   [provider]  sulfamethoxazole-trimethoprim (BACTRIM DS) 800-160 MG tablet Take 1 tablet by mouth 2 (two) times daily for 14 days. 07/11/23 07/25/23  Sharlene Dory, DO    Physical Exam: Vitals:   07/16/23 1215 07/16/23 1245 07/16/23 1302 07/16/23 1315  BP: 121/81 122/85  108/74  Pulse:  91 99  (!) 109  Resp: (!) 27 (!) 26  19  Temp:   98.8 F (37.1 C)   TempSrc:   Oral   SpO2: 97% 97%  99%  Weight:      Height:        Physical Exam Constitutional:      General: He is not in acute distress.    Appearance: Normal appearance.  HENT:     Head: Normocephalic and atraumatic.     Mouth/Throat:     Mouth: Mucous membranes are moist.     Pharynx: Oropharynx is clear.  Eyes:  Extraocular Movements: Extraocular movements intact.     Pupils: Pupils are equal, round, and reactive to light.  Cardiovascular:     Rate and Rhythm: Regular rhythm. Tachycardia present.     Pulses: Normal pulses.     Heart sounds: Normal heart sounds.  Pulmonary:     Effort: Pulmonary effort is normal. No respiratory distress.     Breath sounds: Normal breath sounds.  Abdominal:     General: Bowel sounds are normal. There is no distension.     Palpations: Abdomen is soft.     Tenderness: There is abdominal tenderness.  Musculoskeletal:        General: No swelling or deformity.  Skin:    General: Skin is warm and dry.  Neurological:     General: No focal deficit present.     Mental Status: Mental status is at baseline.    Labs on Admission: I have personally reviewed following labs and imaging studies  CBC: Recent Labs  Lab 07/16/23 0918  WBC 12.4*  NEUTROABS 10.6*  HGB 15.7  HCT 46.4  MCV 97.3  PLT 309    Basic Metabolic Panel: Recent Labs  Lab 07/16/23 0918  NA 136  K 3.4*  CL 101  CO2 20*  GLUCOSE 96  BUN 12  CREATININE 1.15  CALCIUM 9.9    GFR: Estimated Creatinine Clearance: 76.7 mL/min (by C-G formula based on SCr of 1.15 mg/dL).  Liver Function Tests: Recent Labs  Lab 07/16/23 0918  AST 33  ALT 33  ALKPHOS 90  BILITOT 1.1  PROT 7.1  ALBUMIN 4.2    Urine analysis:    Component Value Date/Time   COLORURINE YELLOW 07/11/2023 1213   APPEARANCEUR CLEAR 07/11/2023 1213   LABSPEC <=1.005 (A) 07/11/2023 1213   PHURINE 6.0 07/11/2023 1213    GLUCOSEU NEGATIVE 07/11/2023 1213   HGBUR NEGATIVE 07/11/2023 1213   BILIRUBINUR NEGATIVE 07/11/2023 1213   BILIRUBINUR Negative 02/05/2021 1125   KETONESUR NEGATIVE 07/11/2023 1213   PROTEINUR Positive (A) 02/05/2021 1125   PROTEINUR 100 (A) 02/22/2017 1243   UROBILINOGEN 0.2 07/11/2023 1213   NITRITE NEGATIVE 07/11/2023 1213   LEUKOCYTESUR NEGATIVE 07/11/2023 1213    Radiological Exams on Admission: CT ABDOMEN PELVIS W CONTRAST  Result Date: 07/16/2023 CLINICAL DATA:  Abdominal pain. Pain radiates to pelvis/groin region. Abdominal distension and emesis. * Tracking Code: BO * EXAM: CT ABDOMEN AND PELVIS WITH CONTRAST TECHNIQUE: Multidetector CT imaging of the abdomen and pelvis was performed using the standard protocol following bolus administration of intravenous contrast. RADIATION DOSE REDUCTION: This exam was performed according to the departmental dose-optimization program which includes automated exposure control, adjustment of the mA and/or kV according to patient size and/or use of iterative reconstruction technique. CONTRAST:  75mL OMNIPAQUE IOHEXOL 350 MG/ML SOLN COMPARISON:  07/12/2015 FINDINGS: Lower chest: No acute abnormality. Chronic postinflammatory changes within the periphery of the lung bases with subpleural reticulation and banding. Centrilobular emphysema. Hepatobiliary: Multiple subcapsular hypodensities are identified along the inferior margin of the liver which are new when compared with previous exam, image 69/6, image 64/6 and image 79/6. The largest is along the inferior margin of the right lobe measuring 1.8 x 1.5 cm, image 20 8/3. No distinct soft tissue nodule or mass identified. Status post cholecystectomy. No significant bile duct dilatation. Pancreas: Unremarkable. No pancreatic ductal dilatation or surrounding inflammatory changes. Spleen: Normal in size without focal abnormality. Adrenals/Urinary Tract: Normal adrenal glands. No nephrolithiasis or obstructive  uropathy identified bilaterally. Small subcentimeter hypodense kidney lesions  are identified which are technically too small to characterize and compatible with Bosniak class 2 lesions. No follow-up imaging recommended. Scarring is noted along the posterior cortex of the right kidney. Urinary bladder appears normal. Stomach/Bowel: Increased wall thickening within the gastric antrum and pylorus is identified when compared with the examination from 07/12/2015. This is suboptimally visualized without intraluminal contrast material. The appendix is visualized and appears normal. No pathologic dilatation of the large or small bowel loops. Colonic diverticulosis without signs of acute diverticulitis. No bowel wall thickening or inflammation. Vascular/Lymphatic: Aortic atherosclerosis. No aneurysm. Patent portal vein and upper abdominal vascularity. No signs abdominopelvic adenopathy. Reproductive: Prostate is unremarkable. Other: There is a small to moderate volume of ascites. There is diffuse soft tissue infiltration and haziness identified throughout the omentum and upper abdominal peritoneal fat, image 88/6 and image 33/3. Musculoskeletal: Previous left hip arthroplasty. No aggressive lytic or sclerotic bone lesions. Bilateral L5 pars defects identified. IMPRESSION: 1. There is a small to moderate volume of ascites. There is diffuse soft tissue infiltration and haziness identified throughout the omentum and upper abdominal peritoneal fat. The diagnosis of exclusion would be peritoneal carcinomatosis. 2. Multiple subcapsular hypodensities are identified along the inferior margin of the liver which are new when compared with previous exam. The largest is along the inferior margin of the right lobe measuring 1.8 x 1.5 cm. No distinct soft tissue nodule or mass identified. In the setting of peritoneal disease 1 subcapsular implants cannot be excluded. More definitive characterization may be of chain with contrast enhanced  liver MRI. 3. Increased wall thickening within the gastric antrum and pylorus is identified when compared with the examination from 07/12/2015. This is suboptimally visualized without intraluminal contrast material. This may also be better assessed with contrast enhanced MRI versus direct visualization. 4. Colonic diverticulosis without signs of acute diverticulitis. 5. Aortic Atherosclerosis (ICD10-I70.0) and Emphysema (ICD10-J43.9). Electronically Signed   By: Signa Kell M.D.   On: 07/16/2023 10:53    EKG: Not performed in ED Assessment/Plan Principal Problem:   GI bleed Active Problems:   Dyslipidemia   PERIPHERAL VASCULAR DISEASE   DVT, HX OF   HTN (hypertension)   GERD (gastroesophageal reflux disease)   Abnormal CT of the abdomen   GI bleeding Abnormal CT scan Concern for peritoneal carcinomatosis > Patient presenting with ongoing abdominal pain woke up with severe pain at 10 AM this morning. > Has had some nausea and vomiting and and route today by EMS had what appeared to be coffee-ground emesis.  No recent dark or bloody stools but did have some blood in his stools 3 months ago. > CT scan with changes concerning for possible peritoneal carcinomatosis.  Also with some liver lesions and thickening of stomach wall with MRI recommended for clarification. > Gastroenterology consulted in ED recommending paracentesis for diagnosis if possible, n.p.o., IV PPI. - Monitor on telemetry overnight - Appreciate gastroenterology assistance and recommendations - IR consult for diagnostic paracentesis - Continue fluid labs to include: Cytology, cell count, gram stain, culture, albumin, protein - Pain control as needed - N.p.o. - IV fluids - Supportive care - Type and screen - Hold Eliquis  Alcohol use > Heavy drinker, about a pint of liquor a day.  Risk for withdrawal. - Librium taper - If Significant breakthrough withdrawal symptoms can add PRN Ativan - Folate, thiamine,  multivitamin - Check magnesium, phosphorus  Hypertension - Hold amlodipine for now  Hyperlipidemia - Holding atorvastatin for now  GERD - IV PPI as above  History of PAD - Holding atorvastatin and Eliquis  History of DVT - Holding Eliquis  DVT prophylaxis: SCDs  Code Status:   Full  Family Communication:  None on admission  Disposition Plan:   Patient is from:  Home  Anticipated DC to:  Home   Anticipated DC date:  2-5 days  Anticipated DC barriers: None  Consults called:  Gastroenterology Admission status:  Inpatient, telemetry  Severity of Illness: The appropriate patient status for this patient is INPATIENT. Inpatient status is judged to be reasonable and necessary in order to provide the required intensity of service to ensure the patient's safety. The patient's presenting symptoms, physical exam findings, and initial radiographic and laboratory data in the context of their chronic comorbidities is felt to place them at high risk for further clinical deterioration. Furthermore, it is not anticipated that the patient will be medically stable for discharge from the hospital within 2 midnights of admission.   * I certify that at the point of admission it is my clinical judgment that the patient will require inpatient hospital care spanning beyond 2 midnights from the point of admission due to high intensity of service, high risk for further deterioration and high frequency of surveillance required.Synetta Fail MD [facility]  How to contact the [facility] Attending or Consulting provider 7A - 7P or covering provider during after hours 7P -7A, for this patient?   Check the care team in [facility] and look for a) attending/consulting [facility] provider listed and b) the [facility] team listed Log into [email or web address] and use [place]'s universal password to access. If you do not have the password, please contact the hospital operator. Locate the [facility] provider you are looking for under  [facility] and page to a number that you can be directly reached. If you still have difficulty reaching the provider, please page the [facility] (Director on Call) for the Hospitalists listed on amion for assistance.  07/16/2023, 1:28 PM

## 2023-07-16 NOTE — Consult Note (Signed)
Referring Provider: Dr. Derwood Kaplan Primary Care Physician:  Wanda Plump, MD Primary Gastroenterologist:  Dr. Claudette Head   Reason for Consultation: Upper and lower abdominal pain, N/V, abnormal CTAP  HPI: [name] is a 62 y.o. male with a past medical history of hypertension, peripheral arterial disease s/p embolectomy of left fem-po-tib arteries 08/2006.  PFO, DVT on Eliquis, alcohol use disorder, chronic tobacco and marijuana use, GERD and colon polyps.  Past cholecystectomy, left hip total replacement surgery 01/2023, left inguinal hernia repair 07/2019 and tonsillectomy.   He presented to the ED via EMS secondary to 10 out of 10 upper abdominal pain awakened him from sleep around 2 AM.  He vomited en route as reported by EMS.  Labs in the ED showed a WBC count of 12.4.  Hemoglobin 15.7.  Hematocrit 46.4.  Platelet 309.  Sodium 136.  Potassium 3.4.  Glucose 96.  BUN 12.  Creatinine 1.15.  Albumin 4.2.  Total bili 1.1.  Alk phos 90.  AST 33.  ALT 33.  Anion gap 15.  Lipase 30.  PTT 25.  CTAP with contrast showed evidence of centrilobular emphysema, multiple subscapular hypodensities along the inferior margin of the liver, the largest measuring 1.8 x 1.5 cm, status postcholecystectomy physiology without biliary ductal dilatation, subcentimeter kidney lesions, increased wall thickening within the gastric antrum and pylorus when compared to prior image study in 2016, colonic diverticulosis without acute diverticulitis, small to moderate amount of ascites, diffuse soft tissue infiltration and haziness throughout the omentum and upper abdominal peritoneal fat concerning for peritoneal carcinomatosis.  He developed lower abdominal pain which radiated to his groin and rectum when coughing, sneezing or blowing his nose which started 2 weeks ago.  He also noted having some burning with urination.  He was seen by his PCP 07/11/2023 with concerns for possible UTI and he was empirically started on  Bactrim.  Urine culture resulted negative.  He became constipated after starting Bactrim. He typically passes a brown stool daily, intermittently loose stools.  No black stools.  Approximately 3 months ago, he passed a small to moderate amount of bright red blood in the toilet water and on the toilet tissue which lasted for 3 to 4 days then abated.  He has chronic nausea and vomiting for many years which was previously well-controlled on Nexium 20mg  daily.  However, he stopped taking Nexium approximately 8 months ago and he endorses vomiting most days.  Emesis described as partially digested food or not on bloody emesis.  He stated vomiting x 1 in the ambulance earlier today, described emesis at that time as coffee-ground.  No vomiting since arriving to the ED.  He burps a lot.  Remittent heartburn.  No dysphagia.  He endorses losing 15 to 20 pounds over the past 6 to 8 months.  He has had night sweats for the past week.  He has a history LE  DVT on Eliquis twice daily, last dose was taken at p.m. on 12/7.  No NSAID use.  He underwent an EGD 10/2016 which showed a small hiatal hernia and reflux esophagitis.  His most recent colonoscopy was 12/2019 and one 7 mm tubular adenomatous polyp was removed from the transverse colon.  A repeat colonoscopy in 3 years was recommended but has not been done.  No known family history of esophageal, gastric or colon cancer.  He smokes 1 pack of cigarettes daily for 40+ years.  He drinks 1 pint of liquor daily.  He smokes  marijuana daily.  He received Pantoprazole 80 mg IV x 1.    GI PROCEDURES:  Colonoscopy 12/16/2019: - One 7 mm polyp in the transverse colon, removed with a cold snare. Resected and retrieved.  - Mild diverticulosis in the left colon.  - Internal hemorrhoids.  - The examination was otherwise normal on direct and retroflexion views. -  3 year recall colonoscopy  - TUBULAR ADENOMA(S). - HIGH GRADE DYSPLASIA IS NOT IDENTIFIED.  EGD 10/26/2016: - LA Grade A  reflux esophagitis.  - Small hiatal hernia.  - Normal duodenal bulb and second portion of the duodenum.  - No specimens collected.  Colonoscopy 10/26/2016: - Two 10 to 20 mm polyps in the ascending colon, removed with a hot snare. Resected and retrieved.  - Mild sigmoid colon diverticulosis.  - Internal hemorrhoids.  - The examination was otherwise normal on direct and retroflexion views.  - SESSILE SERRATED ADENOMAS/POLYPS AND SUBMUCOSAL LIPOMAS. - NO HIGH GRADE DYSPLASIA OR MALIGNANCY IDENTIFIED.  EGD 02/11/2014: 1. Gastritis in the gastric antrum and gastric body; multiple biopsies  2. Duodenal inflammation in the bulb 3. Small hiatal hernia. - BENIGN GASTRIC MUCOSA. NO HELICOBACTER PYLORI, INTESTINAL METAPLASIA OR ACTIVE INFLAMMATION IDENTIFIED.  Past Medical History:  Diagnosis Date   Chest pain    stres test neg x 2, cath 5/12; minimal LAD irregs; no obs CAD; normal LVF   Clotting disorder (HCC)    dvt   GERD (gastroesophageal reflux disease)    History of DVT of lower extremity    on chronic coumadin   Hypertension    Internal hemorrhoids    Cscope 2007   PAD (peripheral artery disease) (HCC)    a. left leg ischemia tx wtih embolectomy of left fem, pop, tib arteries with Dr. Edilia Bo - 08/2006;  b. known occlusion of right pop with collats - med Rx;  c.ABI's 8/09: R 1.0; L 0.91    PFO (patent foramen ovale)    per 08/2006 discharge summary, TEE showed EF 60%, small PFO with minimal right-to-left shunt with Valsalva; not felt to be the source of emboli, lifelong Coumadin recommended   Pneumonia    history of   Polysubstance abuse (HCC)    marijuana only   Renal infarct [facility])    R    Past Surgical History:  Procedure Laterality Date   CHOLECYSTECTOMY     COLONOSCOPY     INGUINAL HERNIA REPAIR Left 07/16/2019   Procedure: LAPAROSCOPIC LEFT INGUINAL HERNIA REPAIR WITH MESH;  Surgeon: Axel Filler, MD;  Location: [facility] OR;  Service: General;  Laterality: Left;   left  leg blood clot removal 2009  2009   TONSILLECTOMY     TOTAL HIP ARTHROPLASTY Left 02/03/2023   Procedure: LEFT TOTAL HIP ARTHROPLASTY ANTERIOR APPROACH;  Surgeon: Tarry Kos, MD;  Location: [facility] OR;  Service: Orthopedics;  Laterality: Left;  3-C   WRIST SURGERY Left     Prior to Admission medications   Medication Sig Start Date End Date Taking? Authorizing Provider  amLODipine (NORVASC) 5 MG tablet Take 1 tablet (5 mg total) by mouth daily. 12/19/22   Wanda Plump, MD  apixaban Everlene Balls) 5 MG TABS tablet Take 1 tablet by mouth twice daily 04/21/23   [name], MD  atorvastatin (LIPITOR) 40 MG tablet Take 1 tablet by mouth once daily 05/23/23   [name], MD  docusate sodium (COLACE) 100 MG capsule Take 1 capsule (100 mg total) by mouth daily as needed. 01/30/23 01/30/24  Cristie Hem,  PA-C  EPINEPHrine (EPIPEN 2-PAK) 0.3 mg/0.3 mL IJ SOAJ injection Inject 0.3 mg into the muscle as needed for anaphylaxis. 12/19/22   Wanda Plump, MD  esomeprazole (NEXIUM) 20 MG packet Take 20 mg by mouth daily before breakfast.    [provider]  famotidine (PEPCID) 40 MG tablet Take 1 tablet (40 mg total) by mouth daily. 12/19/22   Wanda Plump, MD  fexofenadine (ALLEGRA) 180 MG tablet Take 180 mg by mouth daily.    [provider]  methocarbamol (ROBAXIN-750) 750 MG tablet Take 1 tablet (750 mg total) by mouth 2 (two) times daily as needed for muscle spasms. 01/30/23   Cristie Hem, PA-C  potassium chloride (KLOR-CON) 10 MEQ tablet Take 40 mEq by mouth 2 (two) times daily. 01/30/23   [provider]  sulfamethoxazole-trimethoprim (BACTRIM DS) 800-160 MG tablet Take 1 tablet by mouth 2 (two) times daily for 14 days. 07/11/23 07/25/23  Sharlene Dory, DO    No current facility-administered medications for this encounter.   Current Outpatient Medications  Medication Sig Dispense Refill   amLODipine (NORVASC) 5 MG tablet Take 1 tablet (5 mg total) by mouth daily. 30  tablet 6   apixaban (ELIQUIS) 5 MG TABS tablet Take 1 tablet by mouth twice daily 60 tablet 5   atorvastatin (LIPITOR) 40 MG tablet Take 1 tablet by mouth once daily 90 tablet 3   docusate sodium (COLACE) 100 MG capsule Take 1 capsule (100 mg total) by mouth daily as needed. 30 capsule 2   EPINEPHrine (EPIPEN 2-PAK) 0.3 mg/0.3 mL IJ SOAJ injection Inject 0.3 mg into the muscle as needed for anaphylaxis. 1 each 1   esomeprazole (NEXIUM) 20 MG packet Take 20 mg by mouth daily before breakfast.     famotidine (PEPCID) 40 MG tablet Take 1 tablet (40 mg total) by mouth daily. 30 tablet 1   fexofenadine (ALLEGRA) 180 MG tablet Take 180 mg by mouth daily.     methocarbamol (ROBAXIN-750) 750 MG tablet Take 1 tablet (750 mg total) by mouth 2 (two) times daily as needed for muscle spasms. 20 tablet 2   potassium chloride (KLOR-CON) 10 MEQ tablet Take 40 mEq by mouth 2 (two) times daily.     sulfamethoxazole-trimethoprim (BACTRIM DS) 800-160 MG tablet Take 1 tablet by mouth 2 (two) times daily for 14 days. 28 tablet 0    Allergies as of 07/16/2023 - Review Complete 07/16/2023  Allergen Reaction Noted   Pravastatin Itching 07/26/2012   Simvastatin Itching 07/26/2012   Lisinopril Swelling 07/05/2019    Family History  Problem Relation Age of Onset   Hypertension Mother    Breast cancer Mother    Hypertension Father    CAD Father    Diabetes Father    Lung cancer Maternal Uncle    Pancreatic cancer Brother    Prostate cancer Maternal Uncle    Heart disease Paternal Grandmother    Colon cancer Neg Hx    Stomach cancer Neg Hx    Esophageal cancer Neg Hx    Rectal cancer Neg Hx     Social History   Socioeconomic History   Marital status: Married    Spouse name: Not on file   Number of children: 2   Years of education: Not on file   Highest education level: Not on file  Occupational History   Occupation: [employer], driver   Tobacco Use   Smoking status: Every Day    Current packs/day:  1.00  Average packs/day: 1 pack/day for 30.0 years (30.0 ttl pk-yrs)    Types: Cigarettes   Smokeless tobacco: Never   Tobacco comments:    1 to 1.5 ppd   Vaping Use   Vaping status: Never Used  Substance and Sexual Activity   Alcohol use: Yes    Alcohol/week: 21.0 standard drinks of alcohol    Types: 21 Standard drinks or equivalent per week    Comment: 3 drinks per day, every day   Drug use: Yes    Types: Marijuana    Comment: smokes every day   Sexual activity: Yes    Partners: Female  Other Topics Concern   Not on file  Social History Narrative   1 child lives w/ them   Social Determinants of Health   Financial Resource Strain: Not on file  Food Insecurity: Not on file  Transportation Needs: Not on file  Physical Activity: Not on file  Stress: Not on file  Social Connections: Not on file  Intimate Partner Violence: Not on file    Review of Systems: Gen:See HPI. CV: Denies chest pain, palpitations or edema. Resp: Denies cough, shortness of breath of hemoptysis.  GI: See HPI. GU : Denies urinary burning, blood in urine, increased urinary frequency or incontinence. MS: Denies joint pain, muscles aches or weakness. Derm: Denies rash, itchiness, skin lesions or unhealing ulcers. Psych: Denies depression, anxiety, memory loss or confusion. Heme: Denies easy bruising, bleeding. Neuro:  Denies headaches, dizziness or paresthesias. Endo:  Denies any problems with DM, thyroid or adrenal function.  Physical Exam: Vital signs in last 24 hours: Temp:  [98.7 F (37.1 C)] 98.7 F (37.1 C) (12/08 0901) Pulse Rate:  [94-120] 94 (12/08 1145) Resp:  [18-24] 20 (12/08 1145) BP: (114-146)/(79-106) 133/93 (12/08 1145) SpO2:  [92 %-100 %] 99 % (12/08 1145) Weight:  [79.4 kg] 79.4 kg (12/08 0857)   General: Alert 62 year old male intermittently moaning out loud secondary to waves of upper abdominal pain. Head:  Normocephalic and atraumatic. Eyes:  No scleral icterus.  Conjunctiva pink. Ears:  Normal auditory acuity. Nose:  No deformity, discharge or lesions. Mouth:  Dentition intact. No ulcers or lesions.  Neck:  Supple. No lymphadenopathy or thyromegaly.  Lungs: Breath sounds clear throughout. No wheezes, rhonchi or crackles.  Heart: Regular rate and rhythm, no murmurs. Abdomen: Moderate tenderness throughout the upper abdomen without rebound or guarding.  Mild tenderness throughout the lower abdomen without rebound or guarding.  No palpable mass.  No bruit.  Abdomen is not tense.  Ascites per CT. Rectal: Deferred. Musculoskeletal:  Symmetrical without gross deformities.  Pulses:  Normal pulses noted. Extremities:  Without clubbing or edema. Neurologic:  Alert and  oriented x 4. No focal deficits.  Skin:  Intact without significant lesions or rashes. Psych:  Alert and cooperative. Normal mood and affect.  Intake/Output from previous day: No intake/output data recorded. Intake/Output this shift: No intake/output data recorded.  Lab Results: Recent Labs    07/16/23 0918  WBC 12.4*  HGB 15.7  HCT 46.4  PLT 309   BMET Recent Labs    07/16/23 0918  NA 136  K 3.4*  CL 101  CO2 20*  GLUCOSE 96  BUN 12  CREATININE 1.15  CALCIUM 9.9   LFT Recent Labs    07/16/23 0918  PROT 7.1  ALBUMIN 4.2  AST 33  ALT 33  ALKPHOS 90  BILITOT 1.1   PT/INR No results for input(s): "LABPROT", "INR" in the last 72 hours.  Hepatitis Panel No results for input(s): "HEPBSAG", "HCVAB", "HEPAIGM", "HEPBIGM" in the last 72 hours.    Studies/Results: CT ABDOMEN PELVIS W CONTRAST  Result Date: 07/16/2023 CLINICAL DATA:  Abdominal pain. Pain radiates to pelvis/groin region. Abdominal distension and emesis. * Tracking Code: BO * EXAM: CT ABDOMEN AND PELVIS WITH CONTRAST TECHNIQUE: Multidetector CT imaging of the abdomen and pelvis was performed using the standard protocol following bolus administration of intravenous contrast. RADIATION DOSE REDUCTION:  This exam was performed according to the departmental dose-optimization program which includes automated exposure control, adjustment of the mA and/or kV according to patient size and/or use of iterative reconstruction technique. CONTRAST:  75mL OMNIPAQUE IOHEXOL 350 MG/ML SOLN COMPARISON:  07/12/2015 FINDINGS: Lower chest: No acute abnormality. Chronic postinflammatory changes within the periphery of the lung bases with subpleural reticulation and banding. Centrilobular emphysema. Hepatobiliary: Multiple subcapsular hypodensities are identified along the inferior margin of the liver which are new when compared with previous exam, image 69/6, image 64/6 and image 79/6. The largest is along the inferior margin of the right lobe measuring 1.8 x 1.5 cm, image 20 8/3. No distinct soft tissue nodule or mass identified. Status post cholecystectomy. No significant bile duct dilatation. Pancreas: Unremarkable. No pancreatic ductal dilatation or surrounding inflammatory changes. Spleen: Normal in size without focal abnormality. Adrenals/Urinary Tract: Normal adrenal glands. No nephrolithiasis or obstructive uropathy identified bilaterally. Small subcentimeter hypodense kidney lesions are identified which are technically too small to characterize and compatible with Bosniak class 2 lesions. No follow-up imaging recommended. Scarring is noted along the posterior cortex of the right kidney. Urinary bladder appears normal. Stomach/Bowel: Increased wall thickening within the gastric antrum and pylorus is identified when compared with the examination from 07/12/2015. This is suboptimally visualized without intraluminal contrast material. The appendix is visualized and appears normal. No pathologic dilatation of the large or small bowel loops. Colonic diverticulosis without signs of acute diverticulitis. No bowel wall thickening or inflammation. Vascular/Lymphatic: Aortic atherosclerosis. No aneurysm. Patent portal vein and upper  abdominal vascularity. No signs abdominopelvic adenopathy. Reproductive: Prostate is unremarkable. Other: There is a small to moderate volume of ascites. There is diffuse soft tissue infiltration and haziness identified throughout the omentum and upper abdominal peritoneal fat, image 88/6 and image 33/3. Musculoskeletal: Previous left hip arthroplasty. No aggressive lytic or sclerotic bone lesions. Bilateral L5 pars defects identified. IMPRESSION: 1. There is a small to moderate volume of ascites. There is diffuse soft tissue infiltration and haziness identified throughout the omentum and upper abdominal peritoneal fat. The diagnosis of exclusion would be peritoneal carcinomatosis. 2. Multiple subcapsular hypodensities are identified along the inferior margin of the liver which are new when compared with previous exam. The largest is along the inferior margin of the right lobe measuring 1.8 x 1.5 cm. No distinct soft tissue nodule or mass identified. In the setting of peritoneal disease 1 subcapsular implants cannot be excluded. More definitive characterization may be of chain with contrast enhanced liver MRI. 3. Increased wall thickening within the gastric antrum and pylorus is identified when compared with the examination from 07/12/2015. This is suboptimally visualized without intraluminal contrast material. This may also be better assessed with contrast enhanced MRI versus direct visualization. 4. Colonic diverticulosis without signs of acute diverticulitis. 5. Aortic Atherosclerosis (ICD10-I70.0) and Emphysema (ICD10-J43.9). Electronically Signed   By: Signa Kell M.D.   On: 07/16/2023 10:53    IMPRESSION/PLAN:  62 year old male admitted to the hospital with new acute upper abdominal pain which awakened him from sleep at  2am with coffee ground emesis x 1 while and lower abdominal pain which radiates to the groin and perineal area x 2 weeks. CTAP with contrast showed evidence of centrilobular emphysema,  multiple subscapular hypodensities along the inferior margin of the liver, the largest measuring 1.8 x 1.5 cm, status postcholecystectomy physiology without biliary ductal dilatation,  increased wall thickening within the gastric antrum and pylorus, colonic diverticulosis without acute diverticulitis, small to moderate amount of ascites and diffuse soft tissue infiltration and haziness throughout the omentum and upper abdominal peritoneal fat concerning for peritoneal carcinomatosis.  WBC 12.4.  Hemoglobin 15.7.  Normal LFTs. -Diagnostic paracentesis if enough ascites to tap, peritoneal fluid labs to include cell count with differential, Gram stain, aerobic and aerobic cultures, albumin, protein and cytology. -Defer endoscopic recommendations to Dr. Adela Lank -NPO -PPI IV bid -IV fluids and pain management per the hospitalist -Ondansetron grams p.o. or IV every 6 hours as needed  GERD -See plan above  Chronic nausea and vomiting -See plan above  History of colon polyps. Colonoscopy 12/16/2019 identified one 7 mm tubular adenomatous polyp removed from the transverse colon.  Patient past due for recall colonoscopy.  History of DVT on Eliquis.  Last dose of Eliquis was 8 PM on 12/7  PAD  Alcohol use disorder -Patient counseled alcohol abstinence  Constipation -MiraLAX nightly   Arnaldo Natal  07/16/2023, 1:17PM

## 2023-07-17 ENCOUNTER — Inpatient Hospital Stay (HOSPITAL_COMMUNITY): Payer: 59

## 2023-07-17 DIAGNOSIS — R1013 Epigastric pain: Secondary | ICD-10-CM | POA: Diagnosis not present

## 2023-07-17 DIAGNOSIS — R112 Nausea with vomiting, unspecified: Secondary | ICD-10-CM | POA: Diagnosis not present

## 2023-07-17 DIAGNOSIS — K254 Chronic or unspecified gastric ulcer with hemorrhage: Secondary | ICD-10-CM | POA: Diagnosis not present

## 2023-07-17 DIAGNOSIS — R933 Abnormal findings on diagnostic imaging of other parts of digestive tract: Secondary | ICD-10-CM

## 2023-07-17 DIAGNOSIS — R634 Abnormal weight loss: Secondary | ICD-10-CM

## 2023-07-17 DIAGNOSIS — R935 Abnormal findings on diagnostic imaging of other abdominal regions, including retroperitoneum: Secondary | ICD-10-CM | POA: Diagnosis not present

## 2023-07-17 DIAGNOSIS — R188 Other ascites: Secondary | ICD-10-CM

## 2023-07-17 HISTORY — PX: IR PARACENTESIS: IMG2679

## 2023-07-17 LAB — COMPREHENSIVE METABOLIC PANEL
ALT: 46 U/L — ABNORMAL HIGH (ref 0–44)
AST: 85 U/L — ABNORMAL HIGH (ref 15–41)
Albumin: 2.9 g/dL — ABNORMAL LOW (ref 3.5–5.0)
Alkaline Phosphatase: 83 U/L (ref 38–126)
Anion gap: 8 (ref 5–15)
BUN: 10 mg/dL (ref 6–20)
CO2: 24 mmol/L (ref 22–32)
Calcium: 8.7 mg/dL — ABNORMAL LOW (ref 8.9–10.3)
Chloride: 101 mmol/L (ref 98–111)
Creatinine, Ser: 1.02 mg/dL (ref 0.61–1.24)
GFR, Estimated: >60 mL/min (ref >60)
Glucose, Bld: 81 mg/dL (ref 70–99)
Potassium: 3.4 mmol/L — ABNORMAL LOW (ref 3.5–5.1)
Sodium: 133 mmol/L — ABNORMAL LOW (ref 135–145)
Total Bilirubin: 1.1 mg/dL (ref <1.2)
Total Protein: 5.4 g/dL — ABNORMAL LOW (ref 6.5–8.1)

## 2023-07-17 LAB — CBC
HCT: 35.2 % — ABNORMAL LOW (ref 39.0–52.0)
Hemoglobin: 11.8 g/dL — ABNORMAL LOW (ref 13.0–17.0)
MCH: 32.8 pg (ref 26.0–34.0)
MCHC: 33.5 g/dL (ref 30.0–36.0)
MCV: 97.8 fL (ref 80.0–100.0)
Platelets: 230 10*3/uL (ref 150–400)
RBC: 3.6 MIL/uL — ABNORMAL LOW (ref 4.22–5.81)
RDW: 14.7 % (ref 11.5–15.5)
WBC: 6.8 10*3/uL (ref 4.0–10.5)
nRBC: 0 % (ref 0.0–0.2)

## 2023-07-17 LAB — BODY FLUID CELL COUNT WITH DIFFERENTIAL
Lymphs, Fluid: 1 %
Monocyte-Macrophage-Serous Fluid: 5 % — ABNORMAL LOW (ref 50–90)
Neutrophil Count, Fluid: 94 % — ABNORMAL HIGH (ref 0–25)
Total Nucleated Cell Count, Fluid: 14250 uL — ABNORMAL HIGH (ref 0–1000)

## 2023-07-17 LAB — PROTEIN, PLEURAL OR PERITONEAL FLUID: Total protein, fluid: 4 g/dL

## 2023-07-17 LAB — ALBUMIN, PLEURAL OR PERITONEAL FLUID: Albumin, Fluid: 2.4 g/dL

## 2023-07-17 MED ORDER — LIDOCAINE HCL 1 % IJ SOLN
INTRAMUSCULAR | Status: AC
  Filled 2023-07-17: qty 20

## 2023-07-17 MED ORDER — MAGNESIUM SULFATE 4 GM/100ML IV SOLN
4.0000 g | Freq: Once | INTRAVENOUS | Status: AC
  Administered 2023-07-17: 4 g via INTRAVENOUS
  Filled 2023-07-17: qty 100

## 2023-07-17 MED ORDER — LIDOCAINE HCL 1 % IJ SOLN
20.0000 mL | Freq: Once | INTRAMUSCULAR | Status: AC
  Administered 2023-07-17: 10 mL
  Filled 2023-07-17: qty 20

## 2023-07-17 MED ORDER — SODIUM CHLORIDE 0.9 % IV SOLN: INTRAVENOUS | Status: DC

## 2023-07-17 MED ORDER — NICOTINE 14 MG/24HR TD PT24
14.0000 mg | MEDICATED_PATCH | Freq: Every day | TRANSDERMAL | Status: DC
  Administered 2023-07-17 – 2023-07-20 (×4): 14 mg via TRANSDERMAL
  Filled 2023-07-17 (×4): qty 1

## 2023-07-17 MED ORDER — ENSURE ENLIVE PO LIQD
237.0000 mL | Freq: Two times a day (BID) | ORAL | Status: DC
  Administered 2023-07-18 – 2023-07-20 (×2): 237 mL via ORAL

## 2023-07-17 NOTE — Progress Notes (Addendum)
[name] Progress Note  Chief Complaint: Ascites, vomiting, weight loss  History:  Signout received from Dr. Adela Lank and inpatient consult note reviewed. This gentleman was seen in his room with his wife at the bedside.  He had recently returned from radiology, where he underwent successful ultrasound guided paracentesis of the right lower quadrant, removing 120 mL of cloudy yellow fluid sent for various studies. He tolerated the procedure well without much pain.  He describes some intermittent upper abdominal pain for years, though perhaps worse in recent months.  He has had intermittent vomiting over similar period of time but also seems recently worse.  His wife believes he has had about a 10 pound weight loss in at least the last year, though he seems to think it is just the last 6 months.  ROS: Cardiovascular: Denies chest pain Respiratory: Denies dyspnea Urinary: Denies dysuria  Objective:   Current Facility-Administered Medications:    acetaminophen (TYLENOL) tablet 650 mg, 650 mg, Oral, Q6H PRN, 650 mg at 07/17/23 0103 **OR** acetaminophen (TYLENOL) suppository 650 mg, 650 mg, Rectal, Q6H PRN, Synetta Fail, MD   chlordiazePOXIDE (LIBRIUM) capsule 25 mg, 25 mg, Oral, Q6H PRN, Synetta Fail, MD   [COMPLETED] chlordiazePOXIDE (LIBRIUM) capsule 25 mg, 25 mg, Oral, QID, 25 mg at 07/17/23 1041 **FOLLOWED BY** chlordiazePOXIDE (LIBRIUM) capsule 25 mg, 25 mg, Oral, TID **FOLLOWED BY** [START ON 07/18/2023] chlordiazePOXIDE (LIBRIUM) capsule 25 mg, 25 mg, Oral, BH-qamhs **FOLLOWED BY** [START ON 07/19/2023] chlordiazePOXIDE (LIBRIUM) capsule 25 mg, 25 mg, Oral, Daily, Synetta Fail, MD   folic acid (FOLVITE) tablet 1 mg, 1 mg, Oral, Daily, Synetta Fail, MD, 1 mg at 07/17/23 1041   HYDROmorphone (DILAUDID) injection 0.5 mg, 0.5 mg, Intravenous, Q3H PRN, Synetta Fail, MD, 0.5 mg at 07/17/23 0830   hydrOXYzine (ATARAX) tablet 25 mg, 25 mg, Oral, Q6H PRN,  Synetta Fail, MD   multivitamin with minerals tablet 1 tablet, 1 tablet, Oral, Daily, Synetta Fail, MD, 1 tablet at 07/17/23 1041   ondansetron (ZOFRAN) injection 4 mg, 4 mg, Intravenous, Q6H PRN, Synetta Fail, MD   pantoprazole (PROTONIX) injection 40 mg, 40 mg, Intravenous, Q12H, Synetta Fail, MD, 40 mg at 07/17/23 1042   polyethylene glycol (MIRALAX / GLYCOLAX) packet 17 g, 17 g, Oral, Daily PRN, Synetta Fail, MD   sodium chloride flush (NS) 0.9 % injection 3 mL, 3 mL, Intravenous, Q12H, Synetta Fail, MD, 3 mL at 07/17/23 1042   thiamine (VITAMIN B1) injection 100 mg, 100 mg, Intravenous, Daily, Synetta Fail, MD, 100 mg at 07/17/23 1041     Vital signs in last 24 hrs: Vitals:   07/17/23 0900 07/17/23 0930  BP: 105/62 110/83  Pulse:    Resp:    Temp:    SpO2:      Intake/Output Summary (Last 24 hours) at 07/17/2023 1131 Last data filed at 07/17/2023 0835 Gross per 24 hour  Intake 549.05 ml  Output 800 ml  Net -250.95 ml     Physical Exam  HEENT: sclera anicteric, oral mucosa without lesions Neck: supple, no thyromegaly, JVD or lymphadenopathy Cardiac: RRR without murmurs, S1S2 heard, no peripheral edema Pulm: clear to auscultation bilaterally, normal RR and effort noted Abdomen: soft, + bandlike upper abdominal tenderness, with active bowel sounds. No guarding or palpable hepatosplenomegaly Skin; warm and dry, no jaundice  Recent Labs:     Latest Ref Rng & Units 07/17/2023    6:47 AM 07/16/2023  9:18 AM 02/04/2023    6:29 AM  CBC  WBC 4.0 - 10.5 K/uL 6.8  12.4  15.7   Hemoglobin 13.0 - 17.0 g/dL 16.1  09.6  04.5   Hematocrit 39.0 - 52.0 % 35.2  46.4  36.7   Platelets 150 - 400 K/uL 230  309  260     No results for input(s): "INR" in the last 168 hours.    Latest Ref Rng & Units 07/17/2023    6:47 AM 07/16/2023    9:18 AM 01/26/2023    8:48 AM  CMP  Glucose 70 - 99 mg/dL 81  96  98   BUN 6 - 20 mg/dL 10  12  14     Creatinine 0.61 - 1.24 mg/dL 4.09  8.11  9.14   Sodium 135 - 145 mmol/L 133  136  137   Potassium 3.5 - 5.1 mmol/L 3.4  3.4  3.1   Chloride 98 - 111 mmol/L 101  101  99   CO2 22 - 32 mmol/L 24  20  28    Calcium 8.9 - 10.3 mg/dL 8.7  9.9  9.8   Total Protein 6.5 - 8.1 g/dL 5.4  7.1  6.9   Total Bilirubin <1.2 mg/dL 1.1  1.1  0.6   Alkaline Phos 38 - 126 U/L 83  90  136   AST 15 - 41 U/L 85  33  29   ALT 0 - 44 U/L 46  33  29      Radiologic studies:  I personally reviewed the images of his admission CT abdomen and pelvis.  Relatively small amount of fluid around the liver down to the right lower quadrant in the pelvis.  He has what may be soft tissue lesions or adenopathy inferior to the liver and what I described as some possible peritoneal implants.  To my review, the thickening of the distal stomach and proximal duodenum are fairly impressive not withstanding the lack of oral contrast.  Assessment & Plan  Assessment:  Epigastric pain Nausea and vomiting Weight loss Ascites of unclear cause with some radiographic findings worrisome for possible carcinomatosis. Abnormal GI imaging (stomach, see above)  Timing of all the symptoms is still somewhat difficult to clarify and the correlation of them with the imaging findings is also unclear at this point.  His overall clinical scenario is worrisome for an underlying malignancy.  Given the symptoms and CT findings, I recommended he undergo an upper endoscopy with me tomorrow.  He is familiar with endoscopic procedures from having had prior colonoscopies with Dr. Russella Dar over many years as well as 2 prior upper endoscopies in 2018 and 2015 with Dr. Russella Dar (done for nausea and vomiting).  He was agreeable to an upper endoscopy after discussion of procedure and risks.  The benefits and risks of the planned procedure were described in detail with the patient or (when appropriate) their health care proxy.  Risks were outlined as including, but  not limited to, bleeding, infection, perforation, adverse medication reaction leading to cardiac or pulmonary decompensation, pancreatitis (if ERCP).  The limitation of incomplete mucosal visualization was also discussed.  No guarantees or warranties were given.  Consent obtained in the presence of his nurse with his wife present as well.  Additionally, we will await the results of his ascites studies to determine if this is portal hypertensive (though imaging does not show clear signs of cirrhosis), or possibly malignant.   40 minutes were spent on this encounter (including  chart review, history/exam, counseling/coordination of care, and documentation) > 50% of that time was spent on counseling and coordination of care.   [name] Office: [phone]

## 2023-07-17 NOTE — Anesthesia Preprocedure Evaluation (Signed)
Anesthesia Evaluation  Patient identified by MRN, date of birth, ID band Patient awake    Reviewed: Allergy & Precautions, NPO status , Patient's Chart, lab work & pertinent test results, reviewed documented beta blocker date and time   Airway Mallampati: II       Dental no notable dental hx. (+) Dental Advisory Given, Caps   Pulmonary pneumonia, resolved, Current Smoker and Patient abstained from smoking.   Pulmonary exam normal breath sounds clear to auscultation       Cardiovascular hypertension, Pt. on medications + Peripheral Vascular Disease  Normal cardiovascular exam Rhythm:Regular Rate:Normal     Neuro/Psych negative neurological ROS  negative psych ROS   GI/Hepatic ,GERD  Medicated,,Elevated LFT's Epigastric pain Nausea and vomiting Weight loss Ascites of unclear cause with some radiographic findings worrisome for possible carcinomatosis. Abnormal GI imaging     Endo/Other  Hyperlipidema  Renal/GU negative Renal ROS  negative genitourinary   Musculoskeletal  (+) Arthritis , Osteoarthritis,    Abdominal   Peds  Hematology  (+) Blood dyscrasia, anemia   Anesthesia Other Findings   Reproductive/Obstetrics                              Anesthesia Physical Anesthesia Plan  ASA: 3  Anesthesia Plan: MAC   Post-op Pain Management: Minimal or no pain anticipated   Induction: Intravenous  PONV Risk Score and Plan: 1 and Treatment may vary due to age or medical condition and Propofol infusion  Airway Management Planned: Natural Airway and Nasal Cannula  Additional Equipment: None  Intra-op Plan:   Post-operative Plan:   Informed Consent: I have reviewed the patients History and Physical, chart, labs and discussed the procedure including the risks, benefits and alternatives for the proposed anesthesia with the patient or authorized representative who has indicated his/her  understanding and acceptance.     Dental advisory given  Plan Discussed with: CRNA and Anesthesiologist  Anesthesia Plan Comments:         Anesthesia Quick Evaluation

## 2023-07-17 NOTE — Procedures (Signed)
PROCEDURE SUMMARY:  Successful US guided paracentesis from right lateral abdomen.  Yielded 120 mL of cloudy, yellow fluid.  No immediate complications.  Pt tolerated well.   Specimen was sent for labs.  EBL < 5mL  Hoyt Koch PA-C 07/17/2023 10:09 AM

## 2023-07-17 NOTE — Plan of Care (Signed)
  Problem: Elimination: Goal: Will not experience complications related to urinary retention Outcome: Progressing   Problem: Pain Management: Goal: General experience of comfort will improve Outcome: Progressing   Problem: Safety: Goal: Ability to remain free from injury will improve Outcome: Progressing   Problem: Skin Integrity: Goal: Risk for impaired skin integrity will decrease Outcome: Progressing

## 2023-07-17 NOTE — Progress Notes (Signed)
PROGRESS NOTE    NYCERE RENY  ZHY:[id number] DOB: 08-10-1960 DOA: 07/16/2023 PCP: Wanda Plump, MD    Brief Narrative:  62 year old with history of hypertension, hyperlipidemia, GERD, PVD, DVT, ongoing alcohol use presented with abdominal pain for at least 2 weeks.  Complained of severe abdominal pain.  He is on Eliquis for history of DVT.  He also had nausea and vomiting, he had coffee-ground emesis.  In the emergency room blood pressure is stable.  Potassium 3.4.  Hemoglobin 15.  CT scan abdomen pelvis small to moderate ascites with diffuse soft tissue inflammation, some scans consistent with peritoneal carcinomatosis.  Questionable liver lesion.  Patient was given pain medications, IV Protonix.  Admitted with GI consultation.  Subjective: Patient seen in the morning rounds.  Came back from paracentesis.  Denies much abdominal pain or nausea today.  He always have some kind of epigastric discomfort.  Wife at the bedside.  120 mL cloudy fluid removed. Analysis shows 94% neutrophils, 14,000 nucleated cells.  Albumin 2.4.  Will hold off on antibiotics.  Cultures pending.  Seen by GI.  For upper GI endoscopy tomorrow. Assessment & Plan:   Upper GI bleeding in the context of coagulopathy on Eliquis Abnormal CT scan, concern for peritoneal carcinomatosis and abnormal liver lesion: Suspected malignancy. NPO.  IV fluids.  IV Protonix. Hemoglobin 15.7-11.8 Diagnostic paracentesis and cytology today. Holding Eliquis. Continue supportive care.  GI following. Upper GI endoscopy tomorrow.  Alcohol use disorder: Heavy drinker.  Drinks about a pinch of liquid a day.  High risk of withdrawal.  Started on Librium taper.  Multivitamins.  Replace magnesium and phosphorus.  Hypertension: At risk of hypotension.  Holding amlodipine. Hyperlipidemia: Holding statin. History of DVT: Holding Eliquis due to active bleeding.  SCDs for DVT prophylaxis.     DVT prophylaxis: SCDs Start: 07/16/23  1320   Code Status: Full code Family Communication: Wife at the bedside Disposition Plan: Status is: Inpatient Remains inpatient appropriate because: Procedures,     Consultants:  Gastroenterology  Procedures:  Paracentesis  Antimicrobials:  None     Objective: Vitals:   07/17/23 0051 07/17/23 0500 07/17/23 0532 07/17/23 0628  BP: 114/75  100/77 103/72  Pulse: 79  77 72  Resp: 18  18   Temp: 98.7 F (37.1 C)  98 F (36.7 C)   TempSrc: Oral     SpO2: 96%  97%   Weight:  75.5 kg    Height:        Intake/Output Summary (Last 24 hours) at 07/17/2023 0738 Last data filed at 07/17/2023 2952 Gross per 24 hour  Intake 549.05 ml  Output 650 ml  Net -100.95 ml   Filed Weights   07/16/23 0857 07/17/23 0500  Weight: 79.4 kg 75.5 kg    Examination:  General exam: Appears calm and comfortable  Alert awake and oriented.  Interactive.  Without any neurological deficits.  Without any tremors or asterixis. Respiratory system: Clear to auscultation. Respiratory effort normal. Cardiovascular system: S1 & S2 heard, RRR.  Gastrointestinal system: Soft.  Nontender.  Bowel sound present. Central nervous system: Alert and oriented. No focal neurological deficits. Extremities: Symmetric 5 x 5 power.    Data Reviewed: I have personally reviewed following labs and imaging studies  CBC: Recent Labs  Lab 07/16/23 0918 07/17/23 0647  WBC 12.4* 6.8  NEUTROABS 10.6*  --   HGB 15.7 11.8*  HCT 46.4 35.2*  MCV 97.3 97.8  PLT 309 230   Basic Metabolic Panel: Recent  Labs  Lab 07/16/23 0918 07/16/23 1831  NA 136  --   K 3.4*  --   CL 101  --   CO2 20*  --   GLUCOSE 96  --   BUN 12  --   CREATININE 1.15  --   CALCIUM 9.9  --   MG  --  1.5*  PHOS  --  3.4   GFR: Estimated Creatinine Clearance: 72.9 mL/min (by C-G formula based on SCr of 1.15 mg/dL). Liver Function Tests: Recent Labs  Lab 07/16/23 0918  AST 33  ALT 33  ALKPHOS 90  BILITOT 1.1  PROT 7.1   ALBUMIN 4.2   Recent Labs  Lab 07/16/23 0918  LIPASE 30   No results for input(s): "AMMONIA" in the last 168 hours. Coagulation Profile: No results for input(s): "INR", "PROTIME" in the last 168 hours. Cardiac Enzymes: No results for input(s): "CKTOTAL", "CKMB", "CKMBINDEX", "TROPONINI" in the last 168 hours. BNP (last 3 results) No results for input(s): "PROBNP" in the last 8760 hours. HbA1C: No results for input(s): "HGBA1C" in the last 72 hours. CBG: No results for input(s): "GLUCAP" in the last 168 hours. Lipid Profile: No results for input(s): "CHOL", "HDL", "LDLCALC", "TRIG", "CHOLHDL", "LDLDIRECT" in the last 72 hours. Thyroid Function Tests: No results for input(s): "TSH", "T4TOTAL", "FREET4", "T3FREE", "THYROIDAB" in the last 72 hours. Anemia Panel: No results for input(s): "VITAMINB12", "FOLATE", "FERRITIN", "TIBC", "IRON", "RETICCTPCT" in the last 72 hours. Sepsis Labs: No results for input(s): "PROCALCITON", "LATICACIDVEN" in the last 168 hours.  Recent Results (from the past 240 hour(s))  Urine Culture     Status: None   Collection Time: 07/11/23 12:13 PM   Specimen: Urine  Result Value Ref Range Status   MICRO NUMBER: [id number]  Final   SPECIMEN QUALITY: Adequate  Final   Sample Source NOT GIVEN  Final   STATUS: FINAL  Final   Result: No Growth  Final         Radiology Studies: CT ABDOMEN PELVIS W CONTRAST  Result Date: 07/16/2023 CLINICAL DATA:  Abdominal pain. Pain radiates to pelvis/groin region. Abdominal distension and emesis. * Tracking Code: BO * EXAM: CT ABDOMEN AND PELVIS WITH CONTRAST TECHNIQUE: Multidetector CT imaging of the abdomen and pelvis was performed using the standard protocol following bolus administration of intravenous contrast. RADIATION DOSE REDUCTION: This exam was performed according to the departmental dose-optimization program which includes automated exposure control, adjustment of the mA and/or kV according to patient size  and/or use of iterative reconstruction technique. CONTRAST:  75mL OMNIPAQUE IOHEXOL 350 MG/ML SOLN COMPARISON:  07/12/2015 FINDINGS: Lower chest: No acute abnormality. Chronic postinflammatory changes within the periphery of the lung bases with subpleural reticulation and banding. Centrilobular emphysema. Hepatobiliary: Multiple subcapsular hypodensities are identified along the inferior margin of the liver which are new when compared with previous exam, image 69/6, image 64/6 and image 79/6. The largest is along the inferior margin of the right lobe measuring 1.8 x 1.5 cm, image 20 8/3. No distinct soft tissue nodule or mass identified. Status post cholecystectomy. No significant bile duct dilatation. Pancreas: Unremarkable. No pancreatic ductal dilatation or surrounding inflammatory changes. Spleen: Normal in size without focal abnormality. Adrenals/Urinary Tract: Normal adrenal glands. No nephrolithiasis or obstructive uropathy identified bilaterally. Small subcentimeter hypodense kidney lesions are identified which are technically too small to characterize and compatible with Bosniak class 2 lesions. No follow-up imaging recommended. Scarring is noted along the posterior cortex of the right kidney. Urinary bladder appears normal. Stomach/Bowel:  Increased wall thickening within the gastric antrum and pylorus is identified when compared with the examination from 07/12/2015. This is suboptimally visualized without intraluminal contrast material. The appendix is visualized and appears normal. No pathologic dilatation of the large or small bowel loops. Colonic diverticulosis without signs of acute diverticulitis. No bowel wall thickening or inflammation. Vascular/Lymphatic: Aortic atherosclerosis. No aneurysm. Patent portal vein and upper abdominal vascularity. No signs abdominopelvic adenopathy. Reproductive: Prostate is unremarkable. Other: There is a small to moderate volume of ascites. There is diffuse soft  tissue infiltration and haziness identified throughout the omentum and upper abdominal peritoneal fat, image 88/6 and image 33/3. Musculoskeletal: Previous left hip arthroplasty. No aggressive lytic or sclerotic bone lesions. Bilateral L5 pars defects identified. IMPRESSION: 1. There is a small to moderate volume of ascites. There is diffuse soft tissue infiltration and haziness identified throughout the omentum and upper abdominal peritoneal fat. The diagnosis of exclusion would be peritoneal carcinomatosis. 2. Multiple subcapsular hypodensities are identified along the inferior margin of the liver which are new when compared with previous exam. The largest is along the inferior margin of the right lobe measuring 1.8 x 1.5 cm. No distinct soft tissue nodule or mass identified. In the setting of peritoneal disease 1 subcapsular implants cannot be excluded. More definitive characterization may be of chain with contrast enhanced liver MRI. 3. Increased wall thickening within the gastric antrum and pylorus is identified when compared with the examination from 07/12/2015. This is suboptimally visualized without intraluminal contrast material. This may also be better assessed with contrast enhanced MRI versus direct visualization. 4. Colonic diverticulosis without signs of acute diverticulitis. 5. Aortic Atherosclerosis (ICD10-I70.0) and Emphysema (ICD10-J43.9). Electronically Signed   By: Signa Kell M.D.   On: 07/16/2023 10:53        Scheduled Meds:  chlordiazePOXIDE  25 mg Oral QID   Followed by   chlordiazePOXIDE  25 mg Oral TID   Followed by   Melene Muller ON 07/18/2023] chlordiazePOXIDE  25 mg Oral BH-qamhs   Followed by   Melene Muller ON 07/19/2023] chlordiazePOXIDE  25 mg Oral Daily   folic acid  1 mg Oral Daily   multivitamin with minerals  1 tablet Oral Daily   pantoprazole (PROTONIX) IV  40 mg Intravenous Q12H   sodium chloride flush  3 mL Intravenous Q12H   thiamine (VITAMIN B1) injection  100 mg  Intravenous Daily   Continuous Infusions:  sodium chloride 100 mL/hr at 07/17/23 0106   magnesium sulfate bolus IVPB       LOS: 1 day    Time spent: 40 minutes    Dorcas Carrow, MD [facility]

## 2023-07-18 ENCOUNTER — Encounter (HOSPITAL_COMMUNITY): Payer: Self-pay | Admitting: Internal Medicine

## 2023-07-18 ENCOUNTER — Inpatient Hospital Stay (HOSPITAL_COMMUNITY): Payer: Self-pay | Admitting: Anesthesiology

## 2023-07-18 ENCOUNTER — Encounter (HOSPITAL_COMMUNITY)
Admission: EM | Disposition: A | Discharge status: Home / Self Care | Payer: Self-pay | Source: Home / Self Care | Attending: Internal Medicine

## 2023-07-18 ENCOUNTER — Other Ambulatory Visit: Payer: Self-pay

## 2023-07-18 DIAGNOSIS — K2970 Gastritis, unspecified, without bleeding: Secondary | ICD-10-CM

## 2023-07-18 DIAGNOSIS — R634 Abnormal weight loss: Secondary | ICD-10-CM

## 2023-07-18 DIAGNOSIS — C169 Malignant neoplasm of stomach, unspecified: Secondary | ICD-10-CM | POA: Diagnosis not present

## 2023-07-18 DIAGNOSIS — R935 Abnormal findings on diagnostic imaging of other abdominal regions, including retroperitoneum: Secondary | ICD-10-CM | POA: Diagnosis not present

## 2023-07-18 DIAGNOSIS — K2900 Acute gastritis without bleeding: Secondary | ICD-10-CM | POA: Insufficient documentation

## 2023-07-18 DIAGNOSIS — K2990 Gastroduodenitis, unspecified, without bleeding: Secondary | ICD-10-CM

## 2023-07-18 DIAGNOSIS — K254 Chronic or unspecified gastric ulcer with hemorrhage: Secondary | ICD-10-CM | POA: Diagnosis not present

## 2023-07-18 HISTORY — PX: ESOPHAGOGASTRODUODENOSCOPY (EGD) WITH PROPOFOL: SHX5813

## 2023-07-18 HISTORY — PX: BIOPSY: SHX5522

## 2023-07-18 LAB — CYTOLOGY - NON PAP

## 2023-07-18 SURGERY — ESOPHAGOGASTRODUODENOSCOPY (EGD) WITH PROPOFOL
Anesthesia: Monitor Anesthesia Care

## 2023-07-18 MED ORDER — LIDOCAINE 2% (20 MG/ML) 5 ML SYRINGE
INTRAMUSCULAR | Status: DC | PRN
  Administered 2023-07-18: 40 mg via INTRAVENOUS

## 2023-07-18 MED ORDER — OXYCODONE HCL 5 MG PO TABS
5.0000 mg | ORAL_TABLET | ORAL | Status: DC | PRN
  Administered 2023-07-18 – 2023-07-19 (×4): 5 mg via ORAL
  Filled 2023-07-18 (×4): qty 1

## 2023-07-18 MED ORDER — ALBUTEROL SULFATE (2.5 MG/3ML) 0.083% IN NEBU
INHALATION_SOLUTION | RESPIRATORY_TRACT | Status: AC
  Filled 2023-07-18: qty 3

## 2023-07-18 MED ORDER — PROPOFOL 10 MG/ML IV BOLUS
INTRAVENOUS | Status: DC | PRN
  Administered 2023-07-18: 50 mg via INTRAVENOUS
  Administered 2023-07-18: 30 mg via INTRAVENOUS
  Administered 2023-07-18 (×2): 20 mg via INTRAVENOUS
  Administered 2023-07-18: 30 mg via INTRAVENOUS
  Administered 2023-07-18 (×3): 20 mg via INTRAVENOUS

## 2023-07-18 MED ORDER — ALBUTEROL SULFATE (2.5 MG/3ML) 0.083% IN NEBU
2.5000 mg | INHALATION_SOLUTION | Freq: Once | RESPIRATORY_TRACT | Status: AC
  Administered 2023-07-18: 2.5 mg via RESPIRATORY_TRACT

## 2023-07-18 MED ORDER — SODIUM CHLORIDE 0.9 % IV SOLN
2.0000 g | INTRAVENOUS | Status: DC
  Administered 2023-07-18 – 2023-07-20 (×3): 2 g via INTRAVENOUS
  Filled 2023-07-18 (×3): qty 20

## 2023-07-18 SURGICAL SUPPLY — 14 items
BLOCK BITE 60FR ADLT L/F BLUE (MISCELLANEOUS) ×3 IMPLANT
ELECT REM PT RETURN 9FT ADLT (ELECTROSURGICAL)
ELECTRODE REM PT RTRN 9FT ADLT (ELECTROSURGICAL) IMPLANT
FORCEP RJ3 GP 1.8X160 W-NEEDLE (CUTTING FORCEPS) IMPLANT
FORCEPS BIOP RAD 4 LRG CAP 4 (CUTTING FORCEPS) IMPLANT
NDL SCLEROTHERAPY 25GX240 (NEEDLE) IMPLANT
NEEDLE SCLEROTHERAPY 25GX240 (NEEDLE) IMPLANT
PROBE APC STR FIRE (PROBE) IMPLANT
PROBE INJECTION GOLD 7FR (MISCELLANEOUS) IMPLANT
SNARE SHORT THROW 13M SML OVAL (MISCELLANEOUS) IMPLANT
SYR 50ML LL SCALE MARK (SYRINGE) IMPLANT
TUBING ENDO SMARTCAP PENTAX (MISCELLANEOUS) ×6 IMPLANT
TUBING IRRIGATION ENDOGATOR (MISCELLANEOUS) ×3 IMPLANT
WATER STERILE IRR 1000ML POUR (IV SOLUTION) IMPLANT

## 2023-07-18 NOTE — Transfer of Care (Signed)
Immediate Anesthesia Transfer of Care Note  Patient: [name]  Procedure(s) Performed: ESOPHAGOGASTRODUODENOSCOPY (EGD) WITH PROPOFOL BIOPSY  Patient Location: Endoscopy Unit  Anesthesia Type:MAC  Level of Consciousness: oriented, drowsy, and patient cooperative  Airway & Oxygen Therapy: Patient Spontanous Breathing and Patient connected to nasal cannula oxygen  Post-op Assessment: Report given to RN and Post -op Vital signs reviewed and stable  Post vital signs: Reviewed and stable  Last Vitals:  Vitals Value Taken Time  BP 111/78 0952  Temp  0952  Pulse 109 0952  Resp 13 0952  SpO2 97 0952    Last Pain:  Vitals:   07/18/23 0826  TempSrc:   PainSc: 7       Patients Stated Pain Goal: 0 (07/18/23 0826)  Complications: No notable events documented.

## 2023-07-18 NOTE — Progress Notes (Signed)
PROGRESS NOTE    [name]  XBM:[id number] DOB: 08/05/1961 DOA: 07/16/2023 PCP: Wanda Plump, MD    Brief Narrative:  62 year old with history of hypertension, hyperlipidemia, GERD, PVD, DVT, ongoing alcohol use presented with abdominal pain for at least 2 weeks.  Complained of severe abdominal pain.  He is on Eliquis for history of DVT.  He also had nausea and vomiting, he had coffee-ground emesis.  In the emergency room blood pressure is stable.  Potassium 3.4.  Hemoglobin 15-11.  CT scan abdomen pelvis small to moderate ascites with diffuse soft tissue inflammation, some scans consistent with peritoneal carcinomatosis.  liver lesion.  Patient was given pain medications, IV Protonix.  Admitted with GI consultation. 12/9, paracentesis with neutrophil counts more than 14,000.  Biopsies pending.  Cultures pending.  Started on Rocephin. 12/10, upper GI endoscopy with no active bleeding.  Congested mucosa in the gastric body and antrum that was biopsied.  Dilated lacteals in the duodenum.  Malignancy workup pending.  Subjective:  Mild abdominal pain present.  No other events.  Assessment & Plan:   Upper GI bleeding in the context of coagulopathy on Eliquis Abnormal CT scan, concern for peritoneal carcinomatosis and abnormal liver lesion: Suspected malignancy.  Paracentesis, cell count and biopsy as above.  Cell count more than 14,000.  Starting on Rocephin pending cultures.  Suspect SBP but currently stable.  Cytology pending for malignancy workup. Upper GI endoscopy today findings as above.  Multiple biopsies were done.  Pending. Allow regular diet.  Monitor hemoglobin.  Holding Eliquis.  Continue supportive care. GI following.  Alcohol use disorder: Heavy drinker.  Drinks about a pint of vodka a day.  High risk of withdrawal.  Started on Librium taper.  Multivitamins.  Electrolytes were replaced.  Adequate today.  Hypertension: At risk of hypotension.  Holding  amlodipine.  Hyperlipidemia: Holding statin.  History of DVT: Holding Eliquis due to active bleeding.  SCDs for DVT prophylaxis.     DVT prophylaxis: SCDs Start: 07/16/23 1320   Code Status: Full code Family Communication: Wife at the bedside Disposition Plan: Status is: Inpatient Remains inpatient appropriate because: Procedures,     Consultants:  Gastroenterology  Procedures:  Paracentesis  Antimicrobials:  Rocephin 12/10---     Objective: Vitals:   07/18/23 0951 07/18/23 1000 07/18/23 1010 07/18/23 1020  BP: (!) 111/90 104/88 (!) 112/90 112/84  Pulse: (!) 101 99 99 99  Resp: 17 11 (!) 21 20  Temp: (!) 97.5 F (36.4 C)     TempSrc: Temporal     SpO2: 98% 96% 96% 95%  Weight:      Height:        Intake/Output Summary (Last 24 hours) at 07/18/2023 1104 Last data filed at 07/18/2023 0943 Gross per 24 hour  Intake 487 ml  Output 1050 ml  Net -563 ml   Filed Weights   07/16/23 0857 07/17/23 0500 07/18/23 0404  Weight: 79.4 kg 75.5 kg 76.1 kg    Examination:  General exam: Appears calm and comfortable .  Pleasant and interactive. Respiratory system: Clear to auscultation. Respiratory effort normal. Cardiovascular system: S1 & S2 heard, RRR.  Gastrointestinal system: Soft.  Nontender.  Bowel sound present.  Nondistended. Central nervous system: Alert and oriented. No focal neurological deficits. Extremities: Symmetric 5 x 5 power.    Data Reviewed: I have personally reviewed following labs and imaging studies  CBC: Recent Labs  Lab 07/16/23 0918 07/17/23 0647  WBC 12.4* 6.8  NEUTROABS 10.6*  --  HGB 15.7 11.8*  HCT 46.4 35.2*  MCV 97.3 97.8  PLT 309 230   Basic Metabolic Panel: Recent Labs  Lab 07/16/23 0918 07/16/23 1831 07/17/23 0647  NA 136  --  133*  K 3.4*  --  3.4*  CL 101  --  101  CO2 20*  --  24  GLUCOSE 96  --  81  BUN 12  --  10  CREATININE 1.15  --  1.02  CALCIUM 9.9  --  8.7*  MG  --  1.5*  --   PHOS  --  3.4   --    GFR: Estimated Creatinine Clearance: 82.9 mL/min (by C-G formula based on SCr of 1.02 mg/dL). Liver Function Tests: Recent Labs  Lab 07/16/23 0918 07/17/23 0647  AST 33 85*  ALT 33 46*  ALKPHOS 90 83  BILITOT 1.1 1.1  PROT 7.1 5.4*  ALBUMIN 4.2 2.9*   Recent Labs  Lab 07/16/23 0918  LIPASE 30   No results for input(s): "AMMONIA" in the last 168 hours. Coagulation Profile: No results for input(s): "INR", "PROTIME" in the last 168 hours. Cardiac Enzymes: No results for input(s): "CKTOTAL", "CKMB", "CKMBINDEX", "TROPONINI" in the last 168 hours. BNP (last 3 results) No results for input(s): "PROBNP" in the last 8760 hours. HbA1C: No results for input(s): "HGBA1C" in the last 72 hours. CBG: No results for input(s): "GLUCAP" in the last 168 hours. Lipid Profile: No results for input(s): "CHOL", "HDL", "LDLCALC", "TRIG", "CHOLHDL", "LDLDIRECT" in the last 72 hours. Thyroid Function Tests: No results for input(s): "TSH", "T4TOTAL", "FREET4", "T3FREE", "THYROIDAB" in the last 72 hours. Anemia Panel: No results for input(s): "VITAMINB12", "FOLATE", "FERRITIN", "TIBC", "IRON", "RETICCTPCT" in the last 72 hours. Sepsis Labs: No results for input(s): "PROCALCITON", "LATICACIDVEN" in the last 168 hours.  Recent Results (from the past 240 hour(s))  Urine Culture     Status: None   Collection Time: 07/11/23 12:13 PM   Specimen: Urine  Result Value Ref Range Status   MICRO NUMBER: [id number]  Final   SPECIMEN QUALITY: Adequate  Final   Sample Source NOT GIVEN  Final   STATUS: FINAL  Final   Result: No Growth  Final  Aerobic/Anaerobic Culture w Gram Stain (surgical/deep wound)     Status: None (Preliminary result)   Collection Time: 07/17/23  9:50 AM   Specimen: Abdomen; Peritoneal Fluid  Result Value Ref Range Status   Specimen Description ABDOMEN PERITONEAL  Final   Special Requests NONE  Final   Gram Stain   Final    ABUNDANT WBC PRESENT, PREDOMINANTLY PMN NO  ORGANISMS SEEN    Culture   Final    NO GROWTH < 24 HOURS Performed at [facility], [address] [place], [place] [address]    Report Status PENDING  Incomplete         Radiology Studies: IR Paracentesis  Result Date: 07/17/2023 INDICATION: 62 year old male with abdominal pain, small volume ascites. Diagnostic paracentesis requested. EXAM: ULTRASOUND GUIDED DIAGNOSTIC PARACENTESIS MEDICATIONS: 10 mL 1% lidocaine COMPLICATIONS: None immediate. PROCEDURE: Informed written consent was obtained from the patient after a discussion of the risks, benefits and alternatives to treatment. A timeout was performed prior to the initiation of the procedure. Initial ultrasound scanning demonstrates a large amount of ascites within the right lower abdominal quadrant. The right lower abdomen was prepped and draped in the usual sterile fashion. 1% lidocaine was used for local anesthesia. Following this, a 19 gauge, 7-cm, Yueh catheter  was introduced. An ultrasound image was saved for documentation purposes. The paracentesis was performed. The catheter was removed and a dressing was applied. The patient tolerated the procedure well without immediate post procedural complication. Patient received post-procedure intravenous albumin; see nursing notes for details. FINDINGS: A total of approximately 120 mL of yellow, clear fluid was removed. Samples were sent to the laboratory as requested by the clinical team. IMPRESSION: Successful ultrasound-guided paracentesis yielding 120 mL of peritoneal fluid. Performed by: Loyce Dys PA-C Electronically Signed   By: Gilmer Mor D.O.   On: 07/17/2023 10:36        Scheduled Meds:  chlordiazePOXIDE  25 mg Oral TID   Followed by   chlordiazePOXIDE  25 mg Oral BH-qamhs   Followed by   Melene Muller ON 07/19/2023] chlordiazePOXIDE  25 mg Oral Daily   feeding supplement  237 mL Oral BID BM   folic acid  1 mg Oral Daily   multivitamin with minerals  1 tablet Oral  Daily   nicotine  14 mg Transdermal Daily   pantoprazole (PROTONIX) IV  40 mg Intravenous Q12H   sodium chloride flush  3 mL Intravenous Q12H   thiamine (VITAMIN B1) injection  100 mg Intravenous Daily   Continuous Infusions:  cefTRIAXone (ROCEPHIN)  IV       LOS: 2 days    Time spent: 35 minutes    Dorcas Carrow, MD [facility]

## 2023-07-18 NOTE — Progress Notes (Signed)
This patient will be coming to the endoscopy department this morning for an upper endoscopy.  Preprocedure chart review for overnight events and lab results reveals his ascitic fluid studies from yesterday had a cell count of nearly 15,000, 94% PMNs. Gram stain shows abundant bacteria without organisms seen. Albumin level 2.4  Serum albumin was normal at 4.2 on admission 07/16/2023, but had dropped to 2.9 yesterday, which was the day of his paracentesis. Therefore, his SAAG is less than 1.1, indicating this does not appear to be portal hypertensive in nature. The markedly elevated WBC could be a secondary peritonitis, though the CT scan does not show any areas suggestive of perforation or microperforation.  In summary, until the clinical picture becomes clearer, I am starting this patient on ceftriaxone 2 g once daily.  Ellwood Dense, MD

## 2023-07-18 NOTE — H&P (View-Only) (Signed)
This patient will be coming to the endoscopy department this morning for an upper endoscopy.  Preprocedure chart review for overnight events and lab results reveals his ascitic fluid studies from yesterday had a cell count of nearly 15,000, 94% PMNs. Gram stain shows abundant bacteria without organisms seen. Albumin level 2.4  Serum albumin was normal at 4.2 on admission 07/16/2023, but had dropped to 2.9 yesterday, which was the day of his paracentesis. Therefore, his SAAG is less than 1.1, indicating this does not appear to be portal hypertensive in nature. The markedly elevated WBC could be a secondary peritonitis, though the CT scan does not show any areas suggestive of perforation or microperforation.  In summary, until the clinical picture becomes clearer, I am starting this patient on ceftriaxone 2 g once daily.  Ellwood Dense, MD

## 2023-07-18 NOTE — Progress Notes (Signed)
I saw this patient again after his endoscopic procedures today.  He had his wife on speaker phone during my visit in his room.  I had spoken with her on the phone earlier today after the procedure, but I wanted to discuss the plan further with them at the same time. Ascites cytology showed no malignant cells and only abundant acute inflammatory cells.  Endoscopic findings reviewed, he knows we are awaiting results of gastric biopsies that were sent rush. I am concerned that the biopsies might not be diagnostic if this is a problem with a deeper level of the gastric wall.  Even if they are diagnostic for malignancy, we need to know whether or not this process is metastatic as suggested on imaging.  As such, I contacted interventional radiology (Dr. Richarda Overlie) to ask if either liver lesions are peritoneal findings are amenable to CT-guided biopsy.  He feels the peritoneal area is accessible, so I asked him to see this patient in consultation today or tomorrow for biopsy.  [name] and his wife are agreeable and all questions were answered.  Ellwood Dense MD

## 2023-07-18 NOTE — Anesthesia Postprocedure Evaluation (Signed)
Anesthesia Post Note  Patient: [name]  Procedure(s) Performed: ESOPHAGOGASTRODUODENOSCOPY (EGD) WITH PROPOFOL BIOPSY     Patient location during evaluation: PACU Anesthesia Type: MAC Level of consciousness: awake and alert and oriented Pain management: pain level controlled Vital Signs Assessment: post-procedure vital signs reviewed and stable Respiratory status: spontaneous breathing, nonlabored ventilation, respiratory function stable and patient connected to nasal cannula oxygen Cardiovascular status: stable and blood pressure returned to baseline Postop Assessment: no apparent nausea or vomiting Anesthetic complications: no   No notable events documented.  Last Vitals:  Vitals:   07/18/23 0951 07/18/23 1000  BP: (!) 111/90 104/88  Pulse: (!) 101 99  Resp: 17 11  Temp: (!) 36.4 C   SpO2: 98% 96%    Last Pain:  Vitals:   07/18/23 0951  TempSrc: Temporal  PainSc: 0-No pain                 [name] A.

## 2023-07-18 NOTE — Op Note (Signed)
[facility] Patient Name: [name] Procedure Date : 07/18/2023 MRN: [id number] Attending MD: Starr Lake. Myrtie Neither , MD, [id number] Date of Birth: 04-Aug-1961 CSN: [id number] Age: 62 Admit Type: Inpatient Procedure:                Upper GI endoscopy Indications:              Upper abdominal pain, Abnormal CT of the GI tract,                            Nausea with vomiting, Weight loss Providers:                Sherilyn Cooter L. Myrtie Neither, MD, Suzy Bouchard, RN, Rozetta Nunnery, Technician Referring MD:             [facility] Medicines:                Monitored Anesthesia Care Complications:            No immediate complications. Estimated Blood Loss:     Estimated blood loss was minimal. Procedure:                Pre-Anesthesia Assessment:                           - Prior to the procedure, a History and Physical                            was performed, and patient medications and                            allergies were reviewed. The patient's tolerance of                            previous anesthesia was also reviewed. The risks                            and benefits of the procedure and the sedation                            options and risks were discussed with the patient.                            All questions were answered, and informed consent                            was obtained. Prior Anticoagulants: The patient has                            taken Eliquis (apixaban), last dose was 3 days                            prior to procedure. ASA Grade Assessment: III - A  patient with severe systemic disease. After                            reviewing the risks and benefits, the patient was                            deemed in satisfactory condition to undergo the                            procedure.                           After obtaining informed consent, the endoscope was                            passed  under direct vision. Throughout the                            procedure, the patient's blood pressure, pulse, and                            oxygen saturations were monitored continuously. The                            [id number]-H190 ([id number]) Olympus endoscope was introduced                            through the mouth, and advanced to the second part                            of duodenum. The upper GI endoscopy was somewhat                            difficult due to a J-shaped stomach which made                            pyloric intubation difficult. The patient tolerated                            the procedure well. Scope In: Scope Out: Findings:      A small hiatal hernia was present. EGJ patulous      A medium amount of food (residue) was found in the gastric body. Fibrous       material and thick fluid that could not be cleared, which limited       visualization.      Diffuse severe mucosal changes characterized by congestion with markedly       thickened mucosal folds were found in the gastric body and in the       gastric antrum. It was difficult to fully insufflate the stomach as       well. Biopsies were taken with a cold forceps for histology. (Biopsies       from antrum and body-rule out H. pylori, rule out malignancy) The       above-noted findings precluded retroflexion. J-shaped stomach created a  significant challenge passing the scope into the duodenum, but that was       ultimately successful.      Diffuse dilated lacteals and small patchy areas of denuded mucosa were       found in the entire examined duodenum. Few diminutive erosions in the       duodenal bulb. Biopsies were taken with a cold forceps for histology       (second portion duodenum). (Rule out celiac, rule out malignancy i.e.       lymphoma) Impression:               - Small hiatal hernia.                           - A medium amount of food (residue) in the stomach.                           -  Congested mucosa in the gastric body and antrum.                            Biopsied.                           - Dilated lacteals were found in the duodenum.                            Biopsied.                           These endoscopic findings, combined with the                            presence of ascites and other abnormalities on CT                            scan, are worrisome for the possibility of an                            infiltrative (and possibly metastatic) gastric                            malignancy. Recommendation:           - Return patient to hospital ward for ongoing care.                           - Low fiber diet.                           - Continue present medications.                           - Await pathology results.                           - Keep head of bed elevated to at least 30 degrees  at all times to decrease the risk of aspiration                            from apparent delayed gastric emptying.                           - See progress note from earlier today regarding                            patient's initial results on ascites and                            institution of antibiotic therapy today.                           - The findings and recommendations were discussed                            with the patient's family.                           - The findings and recommendations were discussed                            with the patient. Procedure Code(s):        --- Professional ---                           [phone], Esophagogastroduodenoscopy, flexible,                            transoral; with biopsy, single or multiple Diagnosis Code(s):        --- Professional ---                           K44.9, Diaphragmatic hernia without obstruction or                            gangrene                           K31.89, Other diseases of stomach and duodenum                           R10.10, Upper abdominal  pain, unspecified                           R11.2, Nausea with vomiting, unspecified                           R63.4, Abnormal weight loss                           R93.3, Abnormal findings on diagnostic imaging of                            other  parts of digestive tract CPT copyright 2022 [employer]. All rights reserved. The codes documented in this report are preliminary and upon coder review may  be revised to meet current compliance requirements. Rahkeem Senft L. Myrtie Neither, MD 07/18/2023 10:01:00 AM This report has been signed electronically. Number of Addenda: 0

## 2023-07-18 NOTE — Interval H&P Note (Signed)
History and Physical Interval Note:  07/18/2023 9:11 AM  [name]  has presented today for surgery, with the diagnosis of Upper Endoscopy.  The various methods of treatment have been discussed with the patient and family. After consideration of risks, benefits and other options for treatment, the patient has consented to  Procedure(s): ESOPHAGOGASTRODUODENOSCOPY (EGD) WITH PROPOFOL (N/A) as a surgical intervention.  The patient's history has been reviewed, patient examined, no change in status, stable for surgery.  I have reviewed the patient's chart and labs.  Questions were answered to the patient's satisfaction.     [name]

## 2023-07-18 NOTE — Anesthesia Procedure Notes (Signed)
Procedure Name: MAC Date/Time: 07/18/2023 9:22 AM  Performed by: Hilda Lias, CRNAPre-anesthesia Checklist: Emergency Drugs available, Patient identified, Patient being monitored and Suction available Patient Re-evaluated:Patient Re-evaluated prior to induction Oxygen Delivery Method: Nasal cannula Induction Type: IV induction Airway Equipment and Method: Bite block Placement Confirmation: positive ETCO2 Dental Injury: Teeth and Oropharynx as per pre-operative assessment

## 2023-07-19 ENCOUNTER — Inpatient Hospital Stay (HOSPITAL_COMMUNITY): Payer: 59

## 2023-07-19 DIAGNOSIS — R935 Abnormal findings on diagnostic imaging of other abdominal regions, including retroperitoneum: Secondary | ICD-10-CM | POA: Diagnosis not present

## 2023-07-19 DIAGNOSIS — R188 Other ascites: Secondary | ICD-10-CM

## 2023-07-19 DIAGNOSIS — R112 Nausea with vomiting, unspecified: Secondary | ICD-10-CM | POA: Diagnosis not present

## 2023-07-19 DIAGNOSIS — K254 Chronic or unspecified gastric ulcer with hemorrhage: Secondary | ICD-10-CM | POA: Diagnosis not present

## 2023-07-19 DIAGNOSIS — R1013 Epigastric pain: Secondary | ICD-10-CM | POA: Diagnosis not present

## 2023-07-19 DIAGNOSIS — K2900 Acute gastritis without bleeding: Secondary | ICD-10-CM | POA: Diagnosis not present

## 2023-07-19 DIAGNOSIS — R180 Malignant ascites: Secondary | ICD-10-CM

## 2023-07-19 DIAGNOSIS — G8929 Other chronic pain: Secondary | ICD-10-CM

## 2023-07-19 LAB — CBC
HCT: 39.8 % (ref 39.0–52.0)
Hemoglobin: 13.2 g/dL (ref 13.0–17.0)
MCH: 32 pg (ref 26.0–34.0)
MCHC: 33.2 g/dL (ref 30.0–36.0)
MCV: 96.4 fL (ref 80.0–100.0)
Platelets: 318 10*3/uL (ref 150–400)
RBC: 4.13 MIL/uL — ABNORMAL LOW (ref 4.22–5.81)
RDW: 14.5 % (ref 11.5–15.5)
WBC: 7.3 10*3/uL (ref 4.0–10.5)
nRBC: 0 % (ref 0.0–0.2)

## 2023-07-19 LAB — PROTIME-INR
INR: 1.1 (ref 0.8–1.2)
Prothrombin Time: 14.7 s (ref 11.4–15.2)

## 2023-07-19 MED ORDER — FENTANYL CITRATE (PF) 100 MCG/2ML IJ SOLN
INTRAMUSCULAR | Status: AC | PRN
  Administered 2023-07-19 (×2): 50 ug via INTRAVENOUS

## 2023-07-19 MED ORDER — MIDAZOLAM HCL 2 MG/2ML IJ SOLN
INTRAMUSCULAR | Status: AC | PRN
  Administered 2023-07-19 (×2): 1 mg via INTRAVENOUS

## 2023-07-19 MED ORDER — APIXABAN 5 MG PO TABS
5.0000 mg | ORAL_TABLET | Freq: Two times a day (BID) | ORAL | Status: DC
  Administered 2023-07-19 – 2023-07-20 (×2): 5 mg via ORAL
  Filled 2023-07-19 (×2): qty 1

## 2023-07-19 MED ORDER — LIDOCAINE HCL 1 % IJ SOLN
10.0000 mL | Freq: Once | INTRAMUSCULAR | Status: AC
  Administered 2023-07-19: 10 mL via INTRADERMAL

## 2023-07-19 MED ORDER — FENTANYL CITRATE (PF) 100 MCG/2ML IJ SOLN
INTRAMUSCULAR | Status: AC
  Filled 2023-07-19: qty 4

## 2023-07-19 MED ORDER — DIPHENHYDRAMINE HCL 50 MG/ML IJ SOLN
INTRAMUSCULAR | Status: AC
  Filled 2023-07-19: qty 1

## 2023-07-19 MED ORDER — PANTOPRAZOLE SODIUM 40 MG PO TBEC
40.0000 mg | DELAYED_RELEASE_TABLET | Freq: Two times a day (BID) | ORAL | Status: DC
  Administered 2023-07-19 – 2023-07-20 (×3): 40 mg via ORAL
  Filled 2023-07-19 (×3): qty 1

## 2023-07-19 MED ORDER — MIDAZOLAM HCL 2 MG/2ML IJ SOLN
INTRAMUSCULAR | Status: AC
  Filled 2023-07-19: qty 4

## 2023-07-19 MED ORDER — THIAMINE MONONITRATE 100 MG PO TABS
100.0000 mg | ORAL_TABLET | Freq: Every day | ORAL | Status: DC
  Administered 2023-07-19 – 2023-07-20 (×2): 100 mg via ORAL
  Filled 2023-07-19 (×2): qty 1

## 2023-07-19 NOTE — Plan of Care (Signed)
°  Problem: Activity: °Goal: Risk for activity intolerance will decrease °Outcome: Progressing °  °Problem: Nutrition: °Goal: Adequate nutrition will be maintained °Outcome: Progressing °  °Problem: Elimination: °Goal: Will not experience complications related to bowel motility °Outcome: Progressing °  °Problem: Safety: °Goal: Ability to remain free from injury will improve °Outcome: Progressing °  °

## 2023-07-19 NOTE — Progress Notes (Signed)
PROGRESS NOTE    [name]  OVF:[id number] DOB: 11/06/1960 DOA: 07/16/2023 PCP: Wanda Plump, MD    Brief Narrative:  62 year old with history of hypertension, hyperlipidemia, GERD, PVD, DVT, ongoing alcohol use presented with abdominal pain for at least 2 weeks.  Complained of severe abdominal pain.  He is on Eliquis for history of DVT.  He also had nausea and vomiting, he had coffee-ground emesis.  In the emergency room blood pressure is stable.  Potassium 3.4.  Hemoglobin 15-11.  CT scan abdomen pelvis small to moderate ascites with diffuse soft tissue inflammation, some scans consistent with peritoneal carcinomatosis.  liver lesion.  Patient was given pain medications, IV Protonix.  Admitted with GI consultation. 12/9, paracentesis with neutrophil counts more than 14,000.  Biopsies pending.  Cultures pending.  Started on Rocephin. 12/10, upper GI endoscopy with no active bleeding.  Congested mucosa in the gastric body and antrum that was biopsied.  Dilated lacteals in the duodenum.  Malignancy workup pending.  Subjective:  No new events.  Continues to have mild to moderate abdominal pain.  Upset about possible diagnosis. For IR guided omental biopsy today.  He is aware about possibility of cancer diagnosis.   Assessment & Plan:   Upper GI bleeding in the context of coagulopathy on Eliquis Abnormal CT scan, concern for peritoneal carcinomatosis and abnormal liver lesion: Suspected GI malignancy.  Paracentesis, cell count and biopsy as above.  Cell count more than 14,000.  Currently on Rocephin.  Cultures are negative so far from paracentesis.  His high cell count could be from malignancy.  Cytology did not show any malignant cells.  Upper GI endoscopy findings as above.  Multiple biopsies were done.  Pending. Holding Eliquis. IR doing omental biopsy today.  Alcohol use disorder: Heavy drinker.  Drinks about a pint of vodka a day.  High risk of withdrawal.  Started on Librium  taper.  Multivitamins.  Electrolytes were replaced.  Adequate today. Uncomplicated so far.  Hypertension: At risk of hypotension.  Holding amlodipine.  Hyperlipidemia: Holding statin.  History of DVT: Holding Eliquis due to active bleeding.  SCDs for DVT prophylaxis.     DVT prophylaxis: SCDs Start: 07/16/23 1320   Code Status: Full code Family Communication: None today. Disposition Plan: Status is: Inpatient Remains inpatient appropriate because: Procedures, abdominal pain.  IV antibiotics.     Consultants:  Gastroenterology  Procedures:  Paracentesis  Antimicrobials:  Rocephin 12/10---     Objective: Vitals:   07/19/23 0930 07/19/23 0935 07/19/23 0940 07/19/23 0945  BP: 110/84 110/85 119/84 106/82  Pulse: 83 94 82 78  Resp: 18 15 15 15   Temp:      TempSrc:      SpO2: 98% 100% 99% 100%  Weight:      Height:        Intake/Output Summary (Last 24 hours) at 07/19/2023 1103 Last data filed at 07/19/2023 0314 Gross per 24 hour  Intake 674 ml  Output --  Net 674 ml   Filed Weights   07/17/23 0500 07/18/23 0404 07/19/23 0428  Weight: 75.5 kg 76.1 kg 74.7 kg    Examination:  General exam: Slightly anxious.  Pleasant to conversation. Respiratory system: Clear to auscultation. Respiratory effort normal. Cardiovascular system: S1 & S2 heard, RRR.  Gastrointestinal system: Soft.  Mild diffuse tenderness present.  Bowel sound present.  Nondistended. Central nervous system: Alert and oriented. No focal neurological deficits. Extremities: Symmetric 5 x 5 power.    Data Reviewed: I have  personally reviewed following labs and imaging studies  CBC: Recent Labs  Lab 07/16/23 0918 07/17/23 0647  WBC 12.4* 6.8  NEUTROABS 10.6*  --   HGB 15.7 11.8*  HCT 46.4 35.2*  MCV 97.3 97.8  PLT 309 230   Basic Metabolic Panel: Recent Labs  Lab 07/16/23 0918 07/16/23 1831 07/17/23 0647  NA 136  --  133*  K 3.4*  --  3.4*  CL 101  --  101  CO2 20*  --  24   GLUCOSE 96  --  81  BUN 12  --  10  CREATININE 1.15  --  1.02  CALCIUM 9.9  --  8.7*  MG  --  1.5*  --   PHOS  --  3.4  --    GFR: Estimated Creatinine Clearance: 81.4 mL/min (by C-G formula based on SCr of 1.02 mg/dL). Liver Function Tests: Recent Labs  Lab 07/16/23 0918 07/17/23 0647  AST 33 85*  ALT 33 46*  ALKPHOS 90 83  BILITOT 1.1 1.1  PROT 7.1 5.4*  ALBUMIN 4.2 2.9*   Recent Labs  Lab 07/16/23 0918  LIPASE 30   No results for input(s): "AMMONIA" in the last 168 hours. Coagulation Profile: Recent Labs  Lab 07/19/23 0629  INR 1.1   Cardiac Enzymes: No results for input(s): "CKTOTAL", "CKMB", "CKMBINDEX", "TROPONINI" in the last 168 hours. BNP (last 3 results) No results for input(s): "PROBNP" in the last 8760 hours. HbA1C: No results for input(s): "HGBA1C" in the last 72 hours. CBG: No results for input(s): "GLUCAP" in the last 168 hours. Lipid Profile: No results for input(s): "CHOL", "HDL", "LDLCALC", "TRIG", "CHOLHDL", "LDLDIRECT" in the last 72 hours. Thyroid Function Tests: No results for input(s): "TSH", "T4TOTAL", "FREET4", "T3FREE", "THYROIDAB" in the last 72 hours. Anemia Panel: No results for input(s): "VITAMINB12", "FOLATE", "FERRITIN", "TIBC", "IRON", "RETICCTPCT" in the last 72 hours. Sepsis Labs: No results for input(s): "PROCALCITON", "LATICACIDVEN" in the last 168 hours.  Recent Results (from the past 240 hour(s))  Urine Culture     Status: None   Collection Time: 07/11/23 12:13 PM   Specimen: Urine  Result Value Ref Range Status   MICRO NUMBER: [id number]  Final   SPECIMEN QUALITY: Adequate  Final   Sample Source NOT GIVEN  Final   STATUS: FINAL  Final   Result: No Growth  Final  Aerobic/Anaerobic Culture w Gram Stain (surgical/deep wound)     Status: None (Preliminary result)   Collection Time: 07/17/23  9:50 AM   Specimen: Abdomen; Peritoneal Fluid  Result Value Ref Range Status   Specimen Description ABDOMEN PERITONEAL  Final    Special Requests NONE  Final   Gram Stain   Final    ABUNDANT WBC PRESENT, PREDOMINANTLY PMN NO ORGANISMS SEEN    Culture   Final    NO GROWTH 2 DAYS Performed at [facility], [address] [place], [place] [address]    Report Status PENDING  Incomplete         Radiology Studies: No results found.      Scheduled Meds:  chlordiazePOXIDE  25 mg Oral BH-qamhs   Followed by   chlordiazePOXIDE  25 mg Oral Daily   feeding supplement  237 mL Oral BID BM   folic acid  1 mg Oral Daily   multivitamin with minerals  1 tablet Oral Daily   nicotine  14 mg Transdermal Daily   pantoprazole  40 mg Oral BID   sodium chloride flush  3 mL Intravenous Q12H   thiamine  100 mg Oral Daily   Continuous Infusions:  cefTRIAXone (ROCEPHIN)  IV 2 g (07/18/23 1115)     LOS: 3 days    Time spent: 35 minutes    Dorcas Carrow, MD [facility]

## 2023-07-19 NOTE — Consult Note (Signed)
Chief Complaint: Patient was seen in consultation today for omental thickening biopsy at the request of Dr Ellwood Dense   Supervising Physician: Simonne Come  Patient Status: [facility] - In-pt  History of Present Illness: [name] is a 62 y.o. male   FULL Code status per pt HTN; HLD; ETOH abuse Abd pain ; N/V Coffee ground emesis To Ed 12/8 for evaluation  CT scan abdomen pelvis small to moderate ascites with diffuse soft tissue inflammation, some scans consistent with peritoneal carcinomatosis.  liver lesion.  Patient was given pain medications, IV Protonix.  Admitted with GI consultation. 12/9, paracentesis with neutrophil counts more than 14,000.  Biopsies pending.  Cultures pending.  Started on Rocephin. 12/10, upper GI endoscopy with no active bleeding.  Congested mucosa in the gastric body and antrum that was biopsied.  Dilated lacteals in the duodenum.  Malignancy workup pending  Dr Myrtie Neither note 12/10: Endoscopic findings reviewed, he knows we are awaiting results of gastric biopsies that were sent rush. I am concerned that the biopsies might not be diagnostic if this is a problem with a deeper level of the gastric wall.  Even if they are diagnostic for malignancy, we need to know whether or not this process is metastatic as suggested on imaging.  As such, I contacted interventional radiology (Dr. Richarda Overlie) to ask if either liver lesions are peritoneal findings are amenable to CT-guided biopsy.  He feels the peritoneal area is accessible, so I asked him to see this patient in consultation today or tomorrow for biopsy.  Scheduled today in IR for omental thickening biopsy  Past Medical History:  Diagnosis Date   Chest pain    stres test neg x 2, cath 5/12; minimal LAD irregs; no obs CAD; normal LVF   Clotting disorder (HCC)    dvt   Deep vein thrombosis (HCC) 10/06/2010   GERD (gastroesophageal reflux disease)    History of DVT of lower extremity    on chronic coumadin    Hypertension    Internal hemorrhoids    Cscope 2007   PAD (peripheral artery disease) (HCC)    a. left leg ischemia tx wtih embolectomy of left fem, pop, tib arteries with Dr. Edilia Bo - 08/2006;  b. known occlusion of right pop with collats - med Rx;  c.ABI's 8/09: R 1.0; L 0.91    PFO (patent foramen ovale)    per 08/2006 discharge summary, TEE showed EF 60%, small PFO with minimal right-to-left shunt with Valsalva; not felt to be the source of emboli, lifelong Coumadin recommended   Pneumonia    history of   Polysubstance abuse (HCC)    marijuana only   Renal infarct [facility])    R    Past Surgical History:  Procedure Laterality Date   CHOLECYSTECTOMY     COLONOSCOPY     INGUINAL HERNIA REPAIR Left 07/16/2019   Procedure: LAPAROSCOPIC LEFT INGUINAL HERNIA REPAIR WITH MESH;  Surgeon: Axel Filler, MD;  Location: [facility] OR;  Service: General;  Laterality: Left;   IR PARACENTESIS  07/17/2023   left leg blood clot removal 2009  2009   TONSILLECTOMY     TOTAL HIP ARTHROPLASTY Left 02/03/2023   Procedure: LEFT TOTAL HIP ARTHROPLASTY ANTERIOR APPROACH;  Surgeon: Tarry Kos, MD;  Location: [facility] OR;  Service: Orthopedics;  Laterality: Left;  3-C   WRIST SURGERY Left     Allergies: Pravastatin and Simvastatin  Medications: Prior to Admission medications   Medication Sig Start Date End Date  Taking? Authorizing Provider  amLODipine (NORVASC) 5 MG tablet Take 1 tablet (5 mg total) by mouth daily. 12/19/22  Yes Paz, Nolon Rod, MD  apixaban Everlene Balls) 5 MG TABS tablet Take 1 tablet by mouth twice daily 04/21/23  Yes [name], MD  atorvastatin (LIPITOR) 40 MG tablet Take 1 tablet by mouth once daily 05/23/23  Yes [name], MD  EPINEPHrine (EPIPEN 2-PAK) 0.3 mg/0.3 mL IJ SOAJ injection Inject 0.3 mg into the muscle as needed for anaphylaxis. 12/19/22  Yes Paz, Nolon Rod, MD  sulfamethoxazole-trimethoprim (BACTRIM) 400-80 MG tablet Take 1 tablet by mouth 2 (two) times daily.   Yes [provider]  docusate sodium (COLACE) 100 MG capsule Take 1 capsule (100 mg total) by mouth daily as needed. Patient not taking: Reported on 07/16/2023 01/30/23 01/30/24  Cristie Hem, PA-C  famotidine (PEPCID) 40 MG tablet Take 1 tablet (40 mg total) by mouth daily. Patient not taking: Reported on 07/16/2023 12/19/22   Wanda Plump, MD  methocarbamol (ROBAXIN-750) 750 MG tablet Take 1 tablet (750 mg total) by mouth 2 (two) times daily as needed for muscle spasms. Patient not taking: Reported on 07/16/2023 01/30/23   Cristie Hem, PA-C     Family History  Problem Relation Age of Onset   Hypertension Mother    Breast cancer Mother    Hypertension Father    CAD Father    Diabetes Father    Lung cancer Maternal Uncle    Pancreatic cancer Brother    Prostate cancer Maternal Uncle    Heart disease Paternal Grandmother    Colon cancer Neg Hx    Stomach cancer Neg Hx    Esophageal cancer Neg Hx    Rectal cancer Neg Hx     Social History   Socioeconomic History   Marital status: Married    Spouse name: Not on file   Number of children: 2   Years of education: Not on file   Highest education level: Not on file  Occupational History   Occupation: [employer], driver   Tobacco Use   Smoking status: Every Day    Current packs/day: 1.00    Average packs/day: 1 pack/day for 30.0 years (30.0 ttl pk-yrs)    Types: Cigarettes   Smokeless tobacco: Never   Tobacco comments:    1 to 1.5 ppd   Vaping Use   Vaping status: Never Used  Substance and Sexual Activity   Alcohol use: Yes    Alcohol/week: 21.0 standard drinks of alcohol    Types: 21 Standard drinks or equivalent per week    Comment: 3 drinks per day, every day   Drug use: Yes    Types: Marijuana    Comment: smokes every day   Sexual activity: Yes    Partners: Female  Other Topics Concern   Not on file  Social History Narrative   1 child lives w/ them   Social Determinants of Health   Financial Resource Strain: Not on  file  Food Insecurity: No Food Insecurity (07/16/2023)   Hunger Vital Sign    Worried About Running Out of Food in the Last Year: Never true    Ran Out of Food in the Last Year: Never true  Transportation Needs: No Transportation Needs (07/16/2023)   PRAPARE - Administrator, Civil Service (Medical): No    Lack of Transportation (Non-Medical): No  Physical Activity: Not on file  Stress: Not on file  Social Connections: Not on file  Review of Systems: A 12 point ROS discussed and pertinent positives are indicated in the HPI above.  All other systems are negative.  Review of Systems  Constitutional:  Positive for activity change and appetite change. Negative for fever.  Respiratory:  Negative for cough and shortness of breath.   Cardiovascular:  Negative for chest pain.  Gastrointestinal:  Positive for abdominal pain, nausea and vomiting.  Neurological:  Negative for weakness.  Psychiatric/Behavioral:  Negative for behavioral problems and confusion.     Vital Signs: BP 90/65 (BP Location: Left Arm)   Pulse 80   Temp 98 F (36.7 C) (Oral)   Resp 18   Ht 6\' 1"  (1.854 m)   Wt 164 lb 10.9 oz (74.7 kg)   SpO2 92%   BMI 21.73 kg/m     Physical Exam Vitals reviewed.  HENT:     Mouth/Throat:     Mouth: Mucous membranes are moist.  Cardiovascular:     Rate and Rhythm: Normal rate and regular rhythm.     Heart sounds: Normal heart sounds.  Pulmonary:     Effort: Pulmonary effort is normal.     Breath sounds: No wheezing.  Abdominal:     Palpations: Abdomen is soft.     Tenderness: There is abdominal tenderness.  Musculoskeletal:        General: Normal range of motion.  Skin:    General: Skin is warm.  Neurological:     Mental Status: He is alert and oriented to person, place, and time.  Psychiatric:        Behavior: Behavior normal.     Imaging: IR Paracentesis  Result Date: 07/17/2023 INDICATION: 62 year old male with abdominal pain, small volume  ascites. Diagnostic paracentesis requested. EXAM: ULTRASOUND GUIDED DIAGNOSTIC PARACENTESIS MEDICATIONS: 10 mL 1% lidocaine COMPLICATIONS: None immediate. PROCEDURE: Informed written consent was obtained from the patient after a discussion of the risks, benefits and alternatives to treatment. A timeout was performed prior to the initiation of the procedure. Initial ultrasound scanning demonstrates a large amount of ascites within the right lower abdominal quadrant. The right lower abdomen was prepped and draped in the usual sterile fashion. 1% lidocaine was used for local anesthesia. Following this, a 19 gauge, 7-cm, Yueh catheter was introduced. An ultrasound image was saved for documentation purposes. The paracentesis was performed. The catheter was removed and a dressing was applied. The patient tolerated the procedure well without immediate post procedural complication. Patient received post-procedure intravenous albumin; see nursing notes for details. FINDINGS: A total of approximately 120 mL of yellow, clear fluid was removed. Samples were sent to the laboratory as requested by the clinical team. IMPRESSION: Successful ultrasound-guided paracentesis yielding 120 mL of peritoneal fluid. Performed by: Loyce Dys PA-C Electronically Signed   By: Gilmer Mor D.O.   On: 07/17/2023 10:36   CT ABDOMEN PELVIS W CONTRAST  Result Date: 07/16/2023 CLINICAL DATA:  Abdominal pain. Pain radiates to pelvis/groin region. Abdominal distension and emesis. * Tracking Code: BO * EXAM: CT ABDOMEN AND PELVIS WITH CONTRAST TECHNIQUE: Multidetector CT imaging of the abdomen and pelvis was performed using the standard protocol following bolus administration of intravenous contrast. RADIATION DOSE REDUCTION: This exam was performed according to the departmental dose-optimization program which includes automated exposure control, adjustment of the mA and/or kV according to patient size and/or use of iterative reconstruction  technique. CONTRAST:  75mL OMNIPAQUE IOHEXOL 350 MG/ML SOLN COMPARISON:  07/12/2015 FINDINGS: Lower chest: No acute abnormality. Chronic postinflammatory changes within the  periphery of the lung bases with subpleural reticulation and banding. Centrilobular emphysema. Hepatobiliary: Multiple subcapsular hypodensities are identified along the inferior margin of the liver which are new when compared with previous exam, image 69/6, image 64/6 and image 79/6. The largest is along the inferior margin of the right lobe measuring 1.8 x 1.5 cm, image 20 8/3. No distinct soft tissue nodule or mass identified. Status post cholecystectomy. No significant bile duct dilatation. Pancreas: Unremarkable. No pancreatic ductal dilatation or surrounding inflammatory changes. Spleen: Normal in size without focal abnormality. Adrenals/Urinary Tract: Normal adrenal glands. No nephrolithiasis or obstructive uropathy identified bilaterally. Small subcentimeter hypodense kidney lesions are identified which are technically too small to characterize and compatible with Bosniak class 2 lesions. No follow-up imaging recommended. Scarring is noted along the posterior cortex of the right kidney. Urinary bladder appears normal. Stomach/Bowel: Increased wall thickening within the gastric antrum and pylorus is identified when compared with the examination from 07/12/2015. This is suboptimally visualized without intraluminal contrast material. The appendix is visualized and appears normal. No pathologic dilatation of the large or small bowel loops. Colonic diverticulosis without signs of acute diverticulitis. No bowel wall thickening or inflammation. Vascular/Lymphatic: Aortic atherosclerosis. No aneurysm. Patent portal vein and upper abdominal vascularity. No signs abdominopelvic adenopathy. Reproductive: Prostate is unremarkable. Other: There is a small to moderate volume of ascites. There is diffuse soft tissue infiltration and haziness identified  throughout the omentum and upper abdominal peritoneal fat, image 88/6 and image 33/3. Musculoskeletal: Previous left hip arthroplasty. No aggressive lytic or sclerotic bone lesions. Bilateral L5 pars defects identified. IMPRESSION: 1. There is a small to moderate volume of ascites. There is diffuse soft tissue infiltration and haziness identified throughout the omentum and upper abdominal peritoneal fat. The diagnosis of exclusion would be peritoneal carcinomatosis. 2. Multiple subcapsular hypodensities are identified along the inferior margin of the liver which are new when compared with previous exam. The largest is along the inferior margin of the right lobe measuring 1.8 x 1.5 cm. No distinct soft tissue nodule or mass identified. In the setting of peritoneal disease 1 subcapsular implants cannot be excluded. More definitive characterization may be of chain with contrast enhanced liver MRI. 3. Increased wall thickening within the gastric antrum and pylorus is identified when compared with the examination from 07/12/2015. This is suboptimally visualized without intraluminal contrast material. This may also be better assessed with contrast enhanced MRI versus direct visualization. 4. Colonic diverticulosis without signs of acute diverticulitis. 5. Aortic Atherosclerosis (ICD10-I70.0) and Emphysema (ICD10-J43.9). Electronically Signed   By: Signa Kell M.D.   On: 07/16/2023 10:53   XR HIP UNILAT W OR W/O PELVIS 2-3 VIEWS LEFT  Result Date: 06/23/2023 Stable left total hip replacement without complication   Labs:  CBC: Recent Labs    01/26/23 0848 02/04/23 0629 07/16/23 0918 07/17/23 0647  WBC 7.9 15.7* 12.4* 6.8  HGB 13.6 12.5* 15.7 11.8*  HCT 41.2 36.7* 46.4 35.2*  PLT 302 260 309 230    COAGS: Recent Labs    07/16/23 0918  APTT 25    BMP: Recent Labs    01/26/23 0848 07/16/23 0918 07/17/23 0647  NA 137 136 133*  K 3.1* 3.4* 3.4*  CL 99 101 101  CO2 28 20* 24  GLUCOSE 98  96 81  BUN 14 12 10   CALCIUM 9.8 9.9 8.7*  CREATININE 0.75 1.15 1.02  GFRNONAA >60 >60 >60    LIVER FUNCTION TESTS: Recent Labs    08/30/22 1006 01/26/23 0848  07/16/23 0918 07/17/23 0647  BILITOT 0.4 0.6 1.1 1.1  AST 34 29 33 85*  ALT 25 29 33 46*  ALKPHOS 98 136* 90 83  PROT 6.8 6.9 7.1 5.4*  ALBUMIN 4.5 3.6 4.2 2.9*    TUMOR MARKERS: No results for input(s): "AFPTM", "CEA", "CA199", "CHROMGRNA" in the last 8760 hours.  Assessment and Plan:  Scheduled for omental thickening biopsy Risks and benefits of omental thickening biopsy was discussed with the patient and/or patient's family including, but not limited to bleeding, infection, damage to adjacent structures or low yield requiring additional tests.  All of the questions were answered and there is agreement to proceed.  Consent signed and in chart.  Thank you for this interesting consult.  I greatly enjoyed meeting [name] and look forward to participating in their care.  A copy of this report was sent to the requesting provider on this date.  Electronically Signed: Robet Leu, PA-C 07/19/2023, 6:44 AM   I spent a total of 20 Minutes    in face to face in clinical consultation, greater than 50% of which was counseling/coordinating care for omental thickening bx

## 2023-07-19 NOTE — Progress Notes (Addendum)
    Progress Note   Subjective  Chief Complaint: Ascites, Vomiting and Weight Loss  Status post EGD on 07/18/2023 for biopsies of thickened gastric mucosa, these are still pending.  They were suspicious for cancer though and patient underwent IR guided biopsy of omentum this morning.  This morning patient reports some gas from both ends, he was a little bit nauseous last night but not right now.  Of course he and his family are just awaiting results to figure out next best steps in his care.  No other new complaints.   Objective   Vital signs in last 24 hours: Temp:  [98 F (36.7 C)-99.5 F (37.5 C)] 98.3 F (36.8 C) (12/11 0802) Pulse Rate:  [78-106] 78 (12/11 0945) Resp:  [14-18] 15 (12/11 0945) BP: (90-120)/(65-85) 106/82 (12/11 0945) SpO2:  [92 %-100 %] 100 % (12/11 0945) Weight:  [74.7 kg] 74.7 kg (12/11 0428) Last BM Date : 07/16/23 General:  AA male in NAD Heart:  Regular rate and rhythm; no murmurs Lungs: Respirations even and unlabored, lungs CTA bilaterally Abdomen:  Soft, mild generalized ttp and nondistended. Normal bowel sounds. Psych:  Cooperative. Normal mood and affect.  Intake/Output from previous day: 12/10 0701 - 12/11 0700 In: 924 [P.O.:474; I.V.:250; IV Piggyback:200] Out: 350 [Urine:350] Intake/Output this shift: Total I/O In: 3 [I.V.:3] Out: -   Lab Results: Recent Labs    07/17/23 0647  WBC 6.8  HGB 11.8*  HCT 35.2*  PLT 230   BMET Recent Labs    07/17/23 0647  NA 133*  K 3.4*  CL 101  CO2 24  GLUCOSE 81  BUN 10  CREATININE 1.02  CALCIUM 8.7*   LFT Recent Labs    07/17/23 0647  PROT 5.4*  ALBUMIN 2.9*  AST 85*  ALT 46*  ALKPHOS 83  BILITOT 1.1   PT/INR Recent Labs    07/19/23 0629  LABPROT 14.7  INR 1.1    Assessment / Plan:   Assessment: 1.  Epigastric pain 2.  Nausea/ vomiting 3.  Weight loss 4.  Ascites of unclear cause: Radiographic findings worrisome for possible carcinomatosis 5.  Abnormal GI imaging:  Thickening of the stomach, status post EGD 12/10 with biopsies pending  Plan: Awaiting biopsy results from EGD 12/10 as well as results from omental biopsy today Continue supportive care  Thank you for your kind consultation, we will continue to follow along.   LOS: 3 days   Unk Lightning  07/19/2023, 11:55 AM  I have taken an interval history and thoroughly reviewed the chart. I agree with the Advanced Practitioner's note, impression and recommendations, and have recorded additional findings, impressions and recommendations below. I performed a substantive portion of this encounter (>50% time spent), including a complete performance of the medical decision making.  My additional thoughts are as follows:  Clinically stable, underwent omental biopsy today.  Awaiting results from that pathology and from yesterday's upper endoscopy gastric mucosal biopsies. No further testing or treatment recommendations at this time. Regular diet as tolerated, as needed antiemetics.   [name] Office:[phone]

## 2023-07-19 NOTE — Procedures (Signed)
Pre procedural Dx: Omental caking  Post procedural Dx: Same  Technically successful CT guided biopsy of omental caking within the right upper abdominal quadrant.   EBL: None.  Complications: None immediate.   Katherina Right, MD Pager #: [phone]

## 2023-07-20 ENCOUNTER — Other Ambulatory Visit: Payer: Self-pay

## 2023-07-20 ENCOUNTER — Encounter (HOSPITAL_COMMUNITY): Payer: Self-pay | Admitting: Gastroenterology

## 2023-07-20 ENCOUNTER — Telehealth: Payer: Self-pay | Admitting: Gastroenterology

## 2023-07-20 DIAGNOSIS — R1013 Epigastric pain: Secondary | ICD-10-CM

## 2023-07-20 DIAGNOSIS — C7989 Secondary malignant neoplasm of other specified sites: Secondary | ICD-10-CM

## 2023-07-20 DIAGNOSIS — C786 Secondary malignant neoplasm of retroperitoneum and peritoneum: Secondary | ICD-10-CM

## 2023-07-20 DIAGNOSIS — G8929 Other chronic pain: Secondary | ICD-10-CM

## 2023-07-20 DIAGNOSIS — K2900 Acute gastritis without bleeding: Secondary | ICD-10-CM

## 2023-07-20 DIAGNOSIS — C169 Malignant neoplasm of stomach, unspecified: Secondary | ICD-10-CM

## 2023-07-20 LAB — COMPREHENSIVE METABOLIC PANEL
ALT: 22 U/L (ref 0–44)
AST: 16 U/L (ref 15–41)
Albumin: 2.7 g/dL — ABNORMAL LOW (ref 3.5–5.0)
Alkaline Phosphatase: 73 U/L (ref 38–126)
Anion gap: 9 (ref 5–15)
BUN: 6 mg/dL (ref 6–20)
CO2: 27 mmol/L (ref 22–32)
Calcium: 9.5 mg/dL (ref 8.9–10.3)
Chloride: 101 mmol/L (ref 98–111)
Creatinine, Ser: 0.91 mg/dL (ref 0.61–1.24)
GFR, Estimated: >60 mL/min (ref >60)
Glucose, Bld: 101 mg/dL — ABNORMAL HIGH (ref 70–99)
Potassium: 4.1 mmol/L (ref 3.5–5.1)
Sodium: 137 mmol/L (ref 135–145)
Total Bilirubin: 0.7 mg/dL (ref <1.2)
Total Protein: 5.7 g/dL — ABNORMAL LOW (ref 6.5–8.1)

## 2023-07-20 LAB — SURGICAL PATHOLOGY

## 2023-07-20 LAB — PHOSPHORUS: Phosphorus: 4.2 mg/dL (ref 2.5–4.6)

## 2023-07-20 LAB — MAGNESIUM: Magnesium: 1.7 mg/dL (ref 1.7–2.4)

## 2023-07-20 MED ORDER — FOLIC ACID 1 MG PO TABS: 1.0000 mg | ORAL_TABLET | Freq: Every day | ORAL | 0 refills | Status: DC

## 2023-07-20 MED ORDER — CIPROFLOXACIN HCL 500 MG PO TABS: 500.0000 mg | ORAL_TABLET | Freq: Two times a day (BID) | ORAL | 0 refills | Status: AC

## 2023-07-20 MED ORDER — OXYCODONE HCL 10 MG PO TABS: 10.0000 mg | ORAL_TABLET | Freq: Four times a day (QID) | ORAL | 0 refills | Status: AC | PRN

## 2023-07-20 MED ORDER — NICOTINE 14 MG/24HR TD PT24: 14.0000 mg | MEDICATED_PATCH | Freq: Every day | TRANSDERMAL | 0 refills | Status: DC

## 2023-07-20 MED ORDER — VITAMIN B-1 100 MG PO TABS: 100.0000 mg | ORAL_TABLET | Freq: Every day | ORAL | 0 refills | Status: DC

## 2023-07-20 MED ORDER — PANTOPRAZOLE SODIUM 40 MG PO TBEC: 40.0000 mg | DELAYED_RELEASE_TABLET | Freq: Two times a day (BID) | ORAL | 0 refills | Status: DC

## 2023-07-20 MED ORDER — POLYETHYLENE GLYCOL 3350 17 G PO PACK: 17.0000 g | PACK | Freq: Every day | ORAL | 0 refills | Status: DC | PRN

## 2023-07-20 NOTE — Telephone Encounter (Signed)
Glendora Score,  This patient of Dr. Russella Dar was just discharged from the hospital today.  I saw him for multiple digestive symptoms and he had extensive testing.  After his discharge this morning, I received a call from the pathology lab with results that both his gastric biopsies and the omental biopsies he had done are positive for adenocarcinoma.  I called Mr. Casados at home and spoke with him while his wife was also on speaker phone.  I gave him this cancer diagnosis, and he is aware that it is metastatic because he has ascites and the omental cancer implant.  At this point, what he needs is an urgent referral to [facility] health oncology for metastatic gastric adenocarcinoma.  Patient was given other treatment recommendations at the time of discharge.  Thank you   - Amada Jupiter, MD  ______________________  Judie Petit,  This patient is aware you are retiring soon and I told him I would just let you know about this because he has seen you for many years.  - HD  ___________________  Loura Back you are consult coordinator can keep an eye out for this send help this man soon.  -Amada Jupiter

## 2023-07-20 NOTE — Telephone Encounter (Signed)
HD, Thank you for the follow up. Will pursue an expedited oncology appt. MS

## 2023-07-20 NOTE — Telephone Encounter (Signed)
Urgent referral placed to oncology.

## 2023-07-20 NOTE — Care Management (Signed)
  Transition of Care [facility]) Screening Note   Patient Details  Name: [name] Date of Birth: 01/26/1961   Transition of Care [facility]) CM/SW Contact:    Lockie Pares, RN Phone Number: 07/20/2023, 9:52 AM    Transition of Care Department [facility]) has reviewed patient and no TOC needs have been identified at this time. We will continue to monitor patient advancement through interdisciplinary progression rounds. If new patient transition needs arise, please place a TOC consult.

## 2023-07-20 NOTE — Discharge Summary (Signed)
Physician Discharge Summary  [name] WUX:[id number] DOB: March 02, 1961 DOA: 07/16/2023  PCP: Wanda Plump, MD  Admit date: 07/16/2023 Discharge date: 07/20/2023  Admitted From: Home Disposition: Home  Recommendations for Outpatient Follow-up:  Follow up with PCP in 1-2 weeks GI will schedule follow-up  Home Health: N/A Equipment/Devices: N/A  Discharge Condition: Stable CODE STATUS: Full code Diet recommendation: Regular diet, nutritional supplements  Discharge summary: 62 year old with history of hypertension, hyperlipidemia, GERD, PVD, DVT, ongoing alcohol use presented with abdominal pain for at least 2 weeks.  Complained of severe abdominal pain.  He is on Eliquis for history of DVT.  He also had nausea and vomiting, he had coffee-ground emesis.  In the emergency room,  blood pressure was stable.  Potassium 3.4.  Hemoglobin 15-11.  CT scan abdomen pelvis small to moderate ascites with diffuse soft tissue inflammation, some scans consistent with peritoneal carcinomatosis.  liver lesion.  Patient was given pain medications, IV Protonix.  Admitted with GI consultation.  12/9, paracentesis with neutrophil counts more than 14,000.  Cultures no growth so far.  Started on Rocephin.  Day 3 today.  Cytology inconclusive.  12/10, upper GI endoscopy with no active bleeding.  Congested mucosa in the gastric body and antrum that was biopsied.  Dilated lacteals in the duodenum.  Malignancy workup pending.  Biopsies pending. 12/11, omental biopsy by IR.  Results pending.  Patient likely has primary GI malignancy with peritoneal carcinomatosis.  Biopsies from endoscopy, biopsy from omental lesion is pending.  Patient is clinically stable today.  He can be discharged home.  Will provide with short course of pain medications.  He had high cell count and his peritoneal fluid, cultures are negative so far.  Received 3 days of Rocephin, will continue ciprofloxacin for 4 more days.  Results will be  called by GI office for follow-up and may need oncology referral once results are available. Electrolytes are replaced.  Patient advised against smoking and alcohol use. His blood pressures are stable.  Will hold amlodipine. Undergoing multiple workup, will hold statin. Patient is back on Eliquis and tolerating.  Stable for discharge.   Discharge Diagnoses:  Principal Problem:   GI bleed Active Problems:   Dyslipidemia   PERIPHERAL VASCULAR DISEASE   DVT, HX OF   Nausea and vomiting in adult   HTN (hypertension)   GERD (gastroesophageal reflux disease)   Abnormal CT of the abdomen   Abnormal loss of weight   Acute gastritis without hemorrhage   Other ascites   Abdominal pain, chronic, epigastric    Discharge Instructions  Discharge Instructions     Diet general   Complete by: As directed    Increase activity slowly   Complete by: As directed       Allergies as of 07/20/2023       Reactions   Pravastatin Itching   Simvastatin Itching        Medication List     STOP taking these medications    amLODipine 5 MG tablet Commonly known as: NORVASC   atorvastatin 40 MG tablet Commonly known as: LIPITOR   sulfamethoxazole-trimethoprim 400-80 MG tablet Commonly known as: BACTRIM       TAKE these medications    ciprofloxacin 500 MG tablet Commonly known as: Cipro Take 1 tablet (500 mg total) by mouth 2 (two) times daily for 5 days.   Eliquis 5 MG Tabs tablet Generic drug: apixaban Take 1 tablet by mouth twice daily   EPINEPHrine 0.3 mg/0.3 mL Soaj  injection Commonly known as: EpiPen 2-Pak Inject 0.3 mg into the muscle as needed for anaphylaxis.   folic acid 1 MG tablet Commonly known as: FOLVITE Take 1 tablet (1 mg total) by mouth daily. Start taking on: July 21, 2023   nicotine 14 mg/24hr patch Commonly known as: NICODERM CQ - dosed in mg/24 hours Place 1 patch (14 mg total) onto the skin daily. Start taking on: July 21, 2023    Oxycodone HCl 10 MG Tabs Take 1 tablet (10 mg total) by mouth every 6 (six) hours as needed for up to 5 days for moderate pain (pain score 4-6) or severe pain (pain score 7-10).   pantoprazole 40 MG tablet Commonly known as: PROTONIX Take 1 tablet (40 mg total) by mouth 2 (two) times daily.   polyethylene glycol 17 g packet Commonly known as: MIRALAX / GLYCOLAX Take 17 g by mouth daily as needed for mild constipation.   thiamine 100 MG tablet Commonly known as: Vitamin B-1 Take 1 tablet (100 mg total) by mouth daily. Start taking on: July 21, 2023        Allergies  Allergen Reactions   Pravastatin Itching   Simvastatin Itching    Consultations: Gastroenterology IR   Procedures/Studies: CT BIOPSY Result Date: 07/19/2023 INDICATION: No known primary, now with the omental thickening of uncertain etiology. Please perform image guided biopsy for tissue diagnostic purposes. EXAM: CT-GUIDED OMENTAL THICKENING BIOPSY COMPARISON:  CT abdomen and pelvis-07/16/2023 MEDICATIONS: None. ANESTHESIA/SEDATION: Moderate (conscious) sedation was employed during this procedure as administered by the Interventional Radiology RN. A total of Versed 2 mg and Fentanyl 100 mcg was administered intravenously. Moderate Sedation Time: 10 minutes. The patient's level of consciousness and vital signs were monitored continuously by radiology nursing throughout the procedure under my direct supervision. CONTRAST:  None. COMPLICATIONS: None immediate. PROCEDURE: Informed consent was obtained from the patient following an explanation of the procedure, risks, benefits and alternatives. A time out was performed prior to the initiation of the procedure. The patient was positioned supine on the CT table and a limited CT was performed for procedural planning demonstrating similar appearance of ill-defined omental thickening. Dominant omental thickening within the right upper abdominal quadrant was targeted for  biopsy. The procedure was planned. The operative site was prepped and draped in the usual sterile fashion. Appropriate trajectory was confirmed with a 22 gauge spinal needle after the adjacent tissues were anesthetized with 1% Lidocaine with epinephrine. Under intermittent CT guidance, a 17 gauge coaxial needle was advanced into the peripheral aspect of the omental thickening within the right upper abdominal quadrant (representative image 8, series 4). Appropriate positioning was confirmed and 5 core needle biopsy samples were obtained with an 18 gauge core needle biopsy device. The co-axial needle was removed and hemostasis was achieved with manual compression. A dressing was placed. The patient tolerated the procedure well without immediate postprocedural complication. IMPRESSION: Technically successful CT guided core needle biopsy of indeterminate omental thickening within the right upper abdominal quadrant. If this biopsy proves nondiagnostic, would recommend further evaluation with PET-CT to evaluate for potential areas of hypermetabolic metabolism for additional diagnostic biopsy targets. Electronically Signed   By: Simonne Come M.D.   On: 07/19/2023 16:03   IR Paracentesis Result Date: 07/17/2023 INDICATION: 62 year old male with abdominal pain, small volume ascites. Diagnostic paracentesis requested. EXAM: ULTRASOUND GUIDED DIAGNOSTIC PARACENTESIS MEDICATIONS: 10 mL 1% lidocaine COMPLICATIONS: None immediate. PROCEDURE: Informed written consent was obtained from the patient after a discussion of the risks,  benefits and alternatives to treatment. A timeout was performed prior to the initiation of the procedure. Initial ultrasound scanning demonstrates a large amount of ascites within the right lower abdominal quadrant. The right lower abdomen was prepped and draped in the usual sterile fashion. 1% lidocaine was used for local anesthesia. Following this, a 19 gauge, 7-cm, Yueh catheter was introduced. An  ultrasound image was saved for documentation purposes. The paracentesis was performed. The catheter was removed and a dressing was applied. The patient tolerated the procedure well without immediate post procedural complication. Patient received post-procedure intravenous albumin; see nursing notes for details. FINDINGS: A total of approximately 120 mL of yellow, clear fluid was removed. Samples were sent to the laboratory as requested by the clinical team. IMPRESSION: Successful ultrasound-guided paracentesis yielding 120 mL of peritoneal fluid. Performed by: Loyce Dys PA-C Electronically Signed   By: Gilmer Mor D.O.   On: 07/17/2023 10:36   CT ABDOMEN PELVIS W CONTRAST Result Date: 07/16/2023 CLINICAL DATA:  Abdominal pain. Pain radiates to pelvis/groin region. Abdominal distension and emesis. * Tracking Code: BO * EXAM: CT ABDOMEN AND PELVIS WITH CONTRAST TECHNIQUE: Multidetector CT imaging of the abdomen and pelvis was performed using the standard protocol following bolus administration of intravenous contrast. RADIATION DOSE REDUCTION: This exam was performed according to the departmental dose-optimization program which includes automated exposure control, adjustment of the mA and/or kV according to patient size and/or use of iterative reconstruction technique. CONTRAST:  75mL OMNIPAQUE IOHEXOL 350 MG/ML SOLN COMPARISON:  07/12/2015 FINDINGS: Lower chest: No acute abnormality. Chronic postinflammatory changes within the periphery of the lung bases with subpleural reticulation and banding. Centrilobular emphysema. Hepatobiliary: Multiple subcapsular hypodensities are identified along the inferior margin of the liver which are new when compared with previous exam, image 69/6, image 64/6 and image 79/6. The largest is along the inferior margin of the right lobe measuring 1.8 x 1.5 cm, image 20 8/3. No distinct soft tissue nodule or mass identified. Status post cholecystectomy. No significant bile duct  dilatation. Pancreas: Unremarkable. No pancreatic ductal dilatation or surrounding inflammatory changes. Spleen: Normal in size without focal abnormality. Adrenals/Urinary Tract: Normal adrenal glands. No nephrolithiasis or obstructive uropathy identified bilaterally. Small subcentimeter hypodense kidney lesions are identified which are technically too small to characterize and compatible with Bosniak class 2 lesions. No follow-up imaging recommended. Scarring is noted along the posterior cortex of the right kidney. Urinary bladder appears normal. Stomach/Bowel: Increased wall thickening within the gastric antrum and pylorus is identified when compared with the examination from 07/12/2015. This is suboptimally visualized without intraluminal contrast material. The appendix is visualized and appears normal. No pathologic dilatation of the large or small bowel loops. Colonic diverticulosis without signs of acute diverticulitis. No bowel wall thickening or inflammation. Vascular/Lymphatic: Aortic atherosclerosis. No aneurysm. Patent portal vein and upper abdominal vascularity. No signs abdominopelvic adenopathy. Reproductive: Prostate is unremarkable. Other: There is a small to moderate volume of ascites. There is diffuse soft tissue infiltration and haziness identified throughout the omentum and upper abdominal peritoneal fat, image 88/6 and image 33/3. Musculoskeletal: Previous left hip arthroplasty. No aggressive lytic or sclerotic bone lesions. Bilateral L5 pars defects identified. IMPRESSION: 1. There is a small to moderate volume of ascites. There is diffuse soft tissue infiltration and haziness identified throughout the omentum and upper abdominal peritoneal fat. The diagnosis of exclusion would be peritoneal carcinomatosis. 2. Multiple subcapsular hypodensities are identified along the inferior margin of the liver which are new when compared with previous exam. The  largest is along the inferior margin of the  right lobe measuring 1.8 x 1.5 cm. No distinct soft tissue nodule or mass identified. In the setting of peritoneal disease 1 subcapsular implants cannot be excluded. More definitive characterization may be of chain with contrast enhanced liver MRI. 3. Increased wall thickening within the gastric antrum and pylorus is identified when compared with the examination from 07/12/2015. This is suboptimally visualized without intraluminal contrast material. This may also be better assessed with contrast enhanced MRI versus direct visualization. 4. Colonic diverticulosis without signs of acute diverticulitis. 5. Aortic Atherosclerosis (ICD10-I70.0) and Emphysema (ICD10-J43.9). Electronically Signed   By: Signa Kell M.D.   On: 07/16/2023 10:53   XR HIP UNILAT W OR W/O PELVIS 2-3 VIEWS LEFT Result Date: 06/23/2023 Stable left total hip replacement without complication  (Echo, Carotid, EGD, Colonoscopy, ERCP)    Subjective: Patient seen in the morning rounds.  Mild to moderate abdominal pain.  Tolerating regular diet.  Bowel movements are regular.  Explained about waiting for biopsy results.   Discharge Exam: Vitals:   07/20/23 0500 07/20/23 0755  BP: 97/67 (!) 122/92  Pulse: 74 72  Resp: 17   Temp: 98.2 F (36.8 C) 97.9 F (36.6 C)  SpO2: 97% 98%   Vitals:   07/19/23 1938 07/20/23 0429 07/20/23 0500 07/20/23 0755  BP: 94/65  97/67 (!) 122/92  Pulse: 79  74 72  Resp: 16  17   Temp: 98.1 F (36.7 C)  98.2 F (36.8 C) 97.9 F (36.6 C)  TempSrc: Oral  Oral Oral  SpO2: 97%  97% 98%  Weight:  75.4 kg    Height:        General: Pt is alert, awake, not in acute distress Cardiovascular: RRR, S1/S2 +, no rubs, no gallops Respiratory: CTA bilaterally, no wheezing, no rhonchi Abdominal: Soft, mild diffuse tenderness without rigidity or guarding.  ND, bowel sounds + Extremities: no edema, no cyanosis    The results of significant diagnostics from this hospitalization (including imaging,  microbiology, ancillary and laboratory) are listed below for reference.     Microbiology: Recent Results (from the past 240 hours)  Urine Culture     Status: None   Collection Time: 07/11/23 12:13 PM   Specimen: Urine  Result Value Ref Range Status   MICRO NUMBER: [id number]  Final   SPECIMEN QUALITY: Adequate  Final   Sample Source NOT GIVEN  Final   STATUS: FINAL  Final   Result: No Growth  Final  Aerobic/Anaerobic Culture w Gram Stain (surgical/deep wound)     Status: None (Preliminary result)   Collection Time: 07/17/23  9:50 AM   Specimen: Abdomen; Peritoneal Fluid  Result Value Ref Range Status   Specimen Description ABDOMEN PERITONEAL  Final   Special Requests NONE  Final   Gram Stain   Final    ABUNDANT WBC PRESENT, PREDOMINANTLY PMN NO ORGANISMS SEEN    Culture   Final    NO GROWTH 2 DAYS NO ANAEROBES ISOLATED; CULTURE IN PROGRESS FOR 5 DAYS Performed at [facility], [address] [place], [place] [address]    Report Status PENDING  Incomplete     Labs: BNP (last 3 results) No results for input(s): "BNP" in the last 8760 hours. Basic Metabolic Panel: Recent Labs  Lab 07/16/23 0918 07/16/23 1831 07/17/23 0647 07/20/23 0624  NA 136  --  133* 137  K 3.4*  --  3.4* 4.1  CL 101  --  101 101  CO2 20*  --  24 27  GLUCOSE 96  --  81 101*  BUN 12  --  10 6  CREATININE 1.15  --  1.02 0.91  CALCIUM 9.9  --  8.7* 9.5  MG  --  1.5*  --  1.7  PHOS  --  3.4  --  4.2   Liver Function Tests: Recent Labs  Lab 07/16/23 0918 07/17/23 0647 07/20/23 0624  AST 33 85* 16  ALT 33 46* 22  ALKPHOS 90 83 73  BILITOT 1.1 1.1 0.7  PROT 7.1 5.4* 5.7*  ALBUMIN 4.2 2.9* 2.7*   Recent Labs  Lab 07/16/23 0918  LIPASE 30   No results for input(s): "AMMONIA" in the last 168 hours. CBC: Recent Labs  Lab 07/16/23 0918 07/17/23 0647 07/19/23 1156  WBC 12.4* 6.8 7.3  NEUTROABS 10.6*  --   --   HGB 15.7 11.8* 13.2  HCT 46.4 35.2* 39.8  MCV 97.3 97.8 96.4  PLT  309 230 318   Cardiac Enzymes: No results for input(s): "CKTOTAL", "CKMB", "CKMBINDEX", "TROPONINI" in the last 168 hours. BNP: Invalid input(s): "POCBNP" CBG: No results for input(s): "GLUCAP" in the last 168 hours. D-Dimer No results for input(s): "DDIMER" in the last 72 hours. Hgb A1c No results for input(s): "HGBA1C" in the last 72 hours. Lipid Profile No results for input(s): "CHOL", "HDL", "LDLCALC", "TRIG", "CHOLHDL", "LDLDIRECT" in the last 72 hours. Thyroid function studies No results for input(s): "TSH", "T4TOTAL", "T3FREE", "THYROIDAB" in the last 72 hours.  Invalid input(s): "FREET3" Anemia work up No results for input(s): "VITAMINB12", "FOLATE", "FERRITIN", "TIBC", "IRON", "RETICCTPCT" in the last 72 hours. Urinalysis    Component Value Date/Time   COLORURINE YELLOW 07/11/2023 1213   APPEARANCEUR CLEAR 07/11/2023 1213   LABSPEC <=1.005 (A) 07/11/2023 1213   PHURINE 6.0 07/11/2023 1213   GLUCOSEU NEGATIVE 07/11/2023 1213   HGBUR NEGATIVE 07/11/2023 1213   BILIRUBINUR NEGATIVE 07/11/2023 1213   BILIRUBINUR Negative 02/05/2021 1125   KETONESUR NEGATIVE 07/11/2023 1213   PROTEINUR Positive (A) 02/05/2021 1125   PROTEINUR 100 (A) 02/22/2017 1243   UROBILINOGEN 0.2 07/11/2023 1213   NITRITE NEGATIVE 07/11/2023 1213   LEUKOCYTESUR NEGATIVE 07/11/2023 1213   Sepsis Labs Recent Labs  Lab 07/16/23 0918 07/17/23 0647 07/19/23 1156  WBC 12.4* 6.8 7.3   Microbiology Recent Results (from the past 240 hours)  Urine Culture     Status: None   Collection Time: 07/11/23 12:13 PM   Specimen: Urine  Result Value Ref Range Status   MICRO NUMBER: [id number]  Final   SPECIMEN QUALITY: Adequate  Final   Sample Source NOT GIVEN  Final   STATUS: FINAL  Final   Result: No Growth  Final  Aerobic/Anaerobic Culture w Gram Stain (surgical/deep wound)     Status: None (Preliminary result)   Collection Time: 07/17/23  9:50 AM   Specimen: Abdomen; Peritoneal Fluid  Result Value  Ref Range Status   Specimen Description ABDOMEN PERITONEAL  Final   Special Requests NONE  Final   Gram Stain   Final    ABUNDANT WBC PRESENT, PREDOMINANTLY PMN NO ORGANISMS SEEN    Culture   Final    NO GROWTH 2 DAYS NO ANAEROBES ISOLATED; CULTURE IN PROGRESS FOR 5 DAYS Performed at [facility], [address], [place], [place] [address]    Report Status PENDING  Incomplete     Time coordinating discharge: 35 minutes  SIGNED:   Dorcas Carrow, MD  [facility]  07/20/2023, 9:24 AM

## 2023-07-21 ENCOUNTER — Telehealth: Payer: Self-pay

## 2023-07-21 ENCOUNTER — Encounter: Payer: Self-pay | Admitting: *Deleted

## 2023-07-21 NOTE — Progress Notes (Signed)
PATIENT NAVIGATOR PROGRESS NOTE  Name: KENAI HAUPTMAN Date: 07/21/2023 MRN: [id number]  DOB: 09-Jun-1961   Reason for visit:  New patient appt  Comments:  Called and spoke with Mr and Mrs Schleeter and have him scheduled with Dr Truett Perna on 12/17 at 1:40pm  Reviewed directions to building and parking as well as contact number to call with any questions    Time spent counseling/coordinating care: 30-45 minutes

## 2023-07-21 NOTE — Patient Instructions (Signed)
Visit Information  Thank you for taking time to visit with me today.  You and your family are in my thoughts and prayers.  If you should decide later to speak with one of our Medical Social Workers, please call me. Please don't hesitate to contact me if I can be of assistance to you before our next scheduled telephone appointment.  Our next appointment is by telephone on 07/26/23 at 2:15 PM   Following is a copy of your care plan:   Goals Addressed             This Visit's Progress    Transition of Care Goals       Re doing chemo, surery, radiation - the whole nine?  I was wondering that too Current Barriers:  Medication management  Diet/Nutrition/Food Resources Provider appointments - H@H  services in the home sched. For 07/22/23. Knowledge Deficits related to plan of care for management of  acute gastritis, GI Bleed, possible stomach malignancy  RNCM Clinical Goal(s):  Patient will work with the Care Management team over the next 30 days to address Transition of Care Barriers: Medication Management Diet/Nutrition/Food Resources Provider appointments through collaboration with RN Care manager, provider, and care team.   Interventions: Evaluation of current treatment plan related to  self management and patient's adherence to plan as established by provider  Transitions of Care:  New goal. Doctor Visits  - discussed the importance of doctor visits HoH services in the home sched. For 07/22/23. Reviewed/discussed with patient to contacted EMS/911 for emergency patient needs such as bleeding, chest pain, shortness of breath, confusion, fainting or other changes outside of your normal baseline.  Oncology:  (Status: New goal.) Short Term Goal Assessment of understanding of oncology diagnosis: Stage 4 Stomach Cancer (12/13) Reviewed upcoming provider appointments and treatment appointments on 07/25/23 at 1:30pm. Assessed available transportation to appointments and treatments. Has  consistent/reliable transportation: Yes - Very supportive family Assessed support system. Has consistent/reliable family or other support: Yes - very supportive spouse and adult children.  Patient Goals/Self-Care Activities: Participate in Transition of Care Program/Attend [facility] scheduled calls Notify RN Care Manager of TOC call rescheduling needs Take all medications as prescribed Attend all scheduled provider appointments Call provider office for new concerns, or changes in your condition or questions regarding medications.  Follow Up Plan:  Telephone follow up appointment with care management team member scheduled for:  07/26/2023 2:15 PM The patient has been provided with contact information for the care management team and has been advised to call with any health related questions or concerns.          Patient verbalizes understanding of instructions and care plan provided today and agrees to view in MyChart. Active MyChart status and patient understanding of how to access instructions and care plan via MyChart confirmed with patient.     Telephone follow up appointment with care management team member scheduled for: The patient has been provided with contact information for the care management team and has been advised to call with any health related questions or concerns.   Please call the care guide team at [phone] if you need to cancel or reschedule your appointment.   Please call the Suicide and Crisis Lifeline: 988 go to [facility] [address] [place] [phone]) if you are experiencing a Mental Health or Behavioral Health Crisis or need someone to talk to.  Ersie Savino Daphine Deutscher BSN, Programmer, systems   Transitions of Care [facility] - Population  Health [place] Direct Dial Number:  [phone]

## 2023-07-21 NOTE — Transitions of Care (Post Inpatient/ED Visit) (Signed)
07/21/2023  Name: [name] MRN: [id number] DOB: [birth year]  Today's TOC FU Call Status: Today's TOC FU Call Status:: Successful TOC FU Call Completed TOC FU Call Complete Date: 07/21/23 Patient's Name and Date of Birth confirmed.  Transition Care Management Follow-up Telephone Call Date of Discharge: 07/21/23 Discharge Facility: [facility]) Type of Discharge: Inpatient Admission Primary Inpatient Discharge Diagnosis:: Stage 4 Gastric Cancer How have you been since you were released from the hospital?: Same ("I am just feel weak, and kinda of down because of the bad news I received"  Physically status is better.) Any questions or concerns?: No  Items Reviewed: Did you receive and understand the discharge instructions provided?: Yes Medications obtained,verified, and reconciled?: Partial Review Completed Reason for Partial Mediation Review: Patient was feeling down as he received bad news recently. Any new allergies since your discharge?: No  Medications Reviewed Today: Medications Reviewed Today     Reviewed by Wyline Mood, RN (Case Manager) on 07/21/23 at 1615  Med List Status: <None>   Medication Order Taking? Sig Documenting Provider Last Dose Status Informant  apixaban (ELIQUIS) 5 MG TABS tablet [id number] Yes Take 1 tablet by mouth twice daily [name], MD Taking Active Self  ciprofloxacin (CIPRO) 500 MG tablet [id number] Yes Take 1 tablet (500 mg total) by mouth 2 (two) times daily for 5 days. Dorcas Carrow, MD Taking Active   EPINEPHrine (EPIPEN 2-PAK) 0.3 mg/0.3 mL IJ SOAJ injection [id number] Yes Inject 0.3 mg into the muscle as needed for anaphylaxis. Wanda Plump, MD Taking Active Self           Med Note Wyline Mood   Fri Jul 21, 2023  4:14 PM) 12/13 - As needed  folic acid (FOLVITE) 1 MG tablet [id number] Yes Take 1 tablet (1 mg total) by mouth daily. Dorcas Carrow, MD Taking Active   nicotine (NICODERM CQ - DOSED IN MG/24 HOURS) 14 mg/24hr  patch [id number] Yes Place 1 patch (14 mg total) onto the skin daily. Dorcas Carrow, MD Taking Active   oxyCODONE 10 MG TABS [id number] Yes Take 1 tablet (10 mg total) by mouth every 6 (six) hours as needed for up to 5 days for moderate pain (pain score 4-6) or severe pain (pain score 7-10). Dorcas Carrow, MD Taking Active   pantoprazole (PROTONIX) 40 MG tablet [id number] Yes Take 1 tablet (40 mg total) by mouth 2 (two) times daily. Dorcas Carrow, MD Taking Active   polyethylene glycol (MIRALAX / GLYCOLAX) 17 g packet [id number]  Take 17 g by mouth daily as needed for mild constipation. Dorcas Carrow, MD  Active   thiamine (VITAMIN B-1) 100 MG tablet [id number]  Take 1 tablet (100 mg total) by mouth daily. Dorcas Carrow, MD  Active             Home Care and Equipment/Supplies: Were Home Health Services Ordered?: NA Any new equipment or medical supplies ordered?: NA  Functional Questionnaire: Do you need assistance with bathing/showering or dressing?: No Do you need assistance with meal preparation?: Yes Do you need assistance with eating?: No Do you have difficulty maintaining continence: No Do you need assistance with getting out of bed/getting out of a chair/moving?: No Do you have difficulty managing or taking your medications?: No  Follow up appointments reviewed: PCP Follow-up appointment confirmed?: NA (Has appointment to see Oncologist 07/25/23.) Specialist Hospital Follow-up appointment confirmed?: Yes Date of Specialist follow-up appointment?: 07/25/23 Follow-Up Specialty Provider:: Oncologist, Dr. Truett Perna Do you need transportation to your follow-up  appointment?: No Do you understand care options if your condition(s) worsen?: Yes-patient verbalized understanding  SDOH Interventions Today    Flowsheet Row Most Recent Value  SDOH Interventions   Food Insecurity Interventions Intervention Not Indicated  Housing Interventions Intervention Not Indicated  Utilities  Interventions Intervention Not Indicated        Goals Addressed             This Visit's Progress    Transition of Care Goals       Re doing chemo, surery, radiation - the whole nine?  I was wondering that too Current Barriers:  Medication management  Diet/Nutrition/Food Resources Provider appointments - H@H  services in the home sched. For 07/22/23. Knowledge Deficits related to plan of care for management of  acute gastritis, GI Bleed, possible stomach malignancy  RNCM Clinical Goal(s):  Patient will work with the Care Management team over the next 30 days to address Transition of Care Barriers: Medication Management Diet/Nutrition/Food Resources Provider appointments through collaboration with RN Care manager, provider, and care team.   Interventions: Evaluation of current treatment plan related to  self management and patient's adherence to plan as established by provider  Transitions of Care:  New goal. Doctor Visits  - discussed the importance of doctor visits HoH services in the home sched. For 07/22/23. Reviewed/discussed with patient to contacted EMS/911 for emergency patient needs such as bleeding, chest pain, shortness of breath, confusion, fainting or other changes outside of your normal baseline.  Oncology:  (Status: New goal.) Short Term Goal Assessment of understanding of oncology diagnosis: Stage 4 Stomach Cancer (12/13) Reviewed upcoming provider appointments and treatment appointments on 07/25/23 at 1:30pm. Assessed available transportation to appointments and treatments. Has consistent/reliable transportation: Yes - Very supportive family Assessed support system. Has consistent/reliable family or other support: Yes - very supportive spouse and adult children.  Patient Goals/Self-Care Activities: Participate in Transition of Care Program/Attend [facility] scheduled calls Notify RN Care Manager of TOC call rescheduling needs Take all medications as  prescribed Attend all scheduled provider appointments Call provider office for new concerns, or changes in your condition or questions regarding medications.  Follow Up Plan:  Telephone follow up appointment with care management team member scheduled for:  07/26/2023 2:15 PM The patient has been provided with contact information for the care management team and has been advised to call with any health related questions or concerns.         TOC Interventions: -Discussed reviewed insurance/health plan benefits.   -Reviewed PCP and any Specialist upcoming appointments and importance of keeping scheduled appointments. -Patient verbal education was provided and verbalized understanding of:  Hospital discharge summary instructions including post-discharge diet and activity orders;  fall/safety/measures; home medications their functions, possible side effects; condition-specific self-management,  and when to notify provider for questions, concerns, or changes in condition.   -Reviewed the 30-day TOC program,  and patient has been enrolled to participate.   Presleigh Feldstein Daphine Deutscher BSN, RN RN Care Manager   Transitions of Care [facility] - [facility] Direct Dial Number:  [phone]

## 2023-07-22 LAB — AEROBIC/ANAEROBIC CULTURE W GRAM STAIN (SURGICAL/DEEP WOUND): Culture: NO GROWTH

## 2023-07-25 ENCOUNTER — Inpatient Hospital Stay: Payer: 59

## 2023-07-25 ENCOUNTER — Ambulatory Visit: Payer: 59

## 2023-07-25 ENCOUNTER — Inpatient Hospital Stay: Payer: 59 | Attending: Oncology | Admitting: Oncology

## 2023-07-25 VITALS — BP 122/95 | HR 100 | Temp 98.2°F | Resp 18 | Ht 73.0 in | Wt 169.0 lb

## 2023-07-25 DIAGNOSIS — R634 Abnormal weight loss: Secondary | ICD-10-CM

## 2023-07-25 DIAGNOSIS — K5900 Constipation, unspecified: Secondary | ICD-10-CM

## 2023-07-25 DIAGNOSIS — Z86718 Personal history of other venous thrombosis and embolism: Secondary | ICD-10-CM | POA: Diagnosis not present

## 2023-07-25 DIAGNOSIS — C786 Secondary malignant neoplasm of retroperitoneum and peritoneum: Secondary | ICD-10-CM | POA: Diagnosis not present

## 2023-07-25 DIAGNOSIS — C169 Malignant neoplasm of stomach, unspecified: Secondary | ICD-10-CM | POA: Diagnosis not present

## 2023-07-25 DIAGNOSIS — F17210 Nicotine dependence, cigarettes, uncomplicated: Secondary | ICD-10-CM | POA: Insufficient documentation

## 2023-07-25 DIAGNOSIS — Z23 Encounter for immunization: Secondary | ICD-10-CM | POA: Insufficient documentation

## 2023-07-25 DIAGNOSIS — G893 Neoplasm related pain (acute) (chronic): Secondary | ICD-10-CM | POA: Insufficient documentation

## 2023-07-25 DIAGNOSIS — Z96642 Presence of left artificial hip joint: Secondary | ICD-10-CM | POA: Diagnosis not present

## 2023-07-25 DIAGNOSIS — Z803 Family history of malignant neoplasm of breast: Secondary | ICD-10-CM | POA: Diagnosis not present

## 2023-07-25 DIAGNOSIS — Z801 Family history of malignant neoplasm of trachea, bronchus and lung: Secondary | ICD-10-CM | POA: Insufficient documentation

## 2023-07-25 DIAGNOSIS — Z7901 Long term (current) use of anticoagulants: Secondary | ICD-10-CM

## 2023-07-25 DIAGNOSIS — Z8042 Family history of malignant neoplasm of prostate: Secondary | ICD-10-CM | POA: Insufficient documentation

## 2023-07-25 DIAGNOSIS — Z800 Family history of malignant neoplasm of digestive organs: Secondary | ICD-10-CM | POA: Diagnosis not present

## 2023-07-25 MED ORDER — MORPHINE SULFATE ER 15 MG PO TBCR: 15.0000 mg | EXTENDED_RELEASE_TABLET | Freq: Two times a day (BID) | ORAL | 0 refills | Status: DC

## 2023-07-25 MED ORDER — HYDROMORPHONE HCL 4 MG PO TABS: 4.0000 mg | ORAL_TABLET | ORAL | 0 refills | Status: DC | PRN

## 2023-07-25 NOTE — Progress Notes (Signed)
[facility] New Patient Consult   Requesting MD: Sherrilyn Rist, Md [address] [place],  [place] [address]   [name] 62 y.o.  [birth year]    Reason for Consult: Gastric cancer   HPI: [name] saw family medicine on 07/11/2023 with dysuria, constipation, and rectal pain.  Rectal examination was unremarkable.  He was treated empirically for urinary tract infection.  He reports the pain progressed and he presented to the emergency room 07/16/2023.  He also complained of nausea and vomiting and weight loss. CT of the abdomen and pelvis revealed ascites, diffuse soft tissue infiltration throughout the omentum and upper abdominal peritoneal fat.  Multiple hypodensities were noted in the inferior margin of the liver, new compared to a CT from 2016, potentially subcapsular implants.  Increased wall thickening was noted in the gastric antrum.  Dr. Myrtie Neither was consulted.  An upper endoscopy 07/18/2023 revealed diffuse severe mucosal congestion and thickening in the gastric body and gastric antrum.  Biopsies were obtained.  Duodenal biopsies were obtained.  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  The duodenal biopsy revealed no specific histologic changes.  A paracentesis 07/17/2023 revealed no malignant cells and was positive for acute inflammation. A CT-guided biopsy of right omental thickening on 07/19/2023 revealed metastatic poorly differentiated carcinoma consistent with gastric carcinoma.  [name] was discharged from hospital 07/20/2023.  He continues to have abdominal pain.  Pain is not adequately relieved with oxycodone.  He is here today with his wife.   Past Medical History:  Diagnosis Date   Chest pain    stres test neg x 2, cath 5/12; minimal LAD irregs; no obs CAD; normal LVF   Clotting disorder (HCC)    dvt   Deep vein thrombosis (HCC) 10/06/2010   GERD  (gastroesophageal reflux disease)    History of DVT of lower extremity-reports history of recurrent DVT       Hypertension    Internal hemorrhoids    Cscope 2007   PAD (peripheral artery disease) (HCC)    a. left leg ischemia tx wtih embolectomy of left fem, pop, tib arteries with Dr. Edilia Bo - 08/2006;  b. known occlusion of right pop with collats - med Rx;  c.ABI's 8/09: R 1.0; L 0.91    PFO (patent foramen ovale)    per 08/2006 discharge summary, TEE showed EF 60%, small PFO with minimal right-to-left shunt with Valsalva; not felt to be the source of emboli, lifelong Coumadin recommended   Pneumonia    history of   Polysubstance abuse (HCC)    marijuana only   Renal infarct [facility])    R    Past Surgical History:  Procedure Laterality Date   BIOPSY  07/18/2023   Procedure: BIOPSY;  Surgeon: Sherrilyn Rist, MD;  Location: [facility] ENDOSCOPY;  Service: Gastroenterology;;   CHOLECYSTECTOMY     COLONOSCOPY     ESOPHAGOGASTRODUODENOSCOPY (EGD) WITH PROPOFOL N/A 07/18/2023   Procedure: ESOPHAGOGASTRODUODENOSCOPY (EGD) WITH PROPOFOL;  Surgeon: Sherrilyn Rist, MD;  Location: [facility] ENDOSCOPY;  Service: Gastroenterology;  Laterality: N/A;   INGUINAL HERNIA REPAIR Left 07/16/2019   Procedure: LAPAROSCOPIC LEFT INGUINAL HERNIA REPAIR WITH MESH;  Surgeon: Axel Filler, MD;  Location: [facility] OR;  Service: General;  Laterality: Left;   IR PARACENTESIS  07/17/2023   left leg blood clot removal 2009  2009   TONSILLECTOMY     TOTAL HIP ARTHROPLASTY Left 02/03/2023  Procedure: LEFT TOTAL HIP ARTHROPLASTY ANTERIOR APPROACH;  Surgeon: Tarry Kos, MD;  Location: [facility] OR;  Service: Orthopedics;  Laterality: Left;  3-C   WRIST SURGERY Left     Medications: Reviewed  Allergies:  Allergies  Allergen Reactions   Pravastatin Itching   Simvastatin Itching    Family history: Mother had breast cancer.  Maternal uncle had prostate cancer.  Maternal uncle had lung cancer.  Maternal aunt had lung cancer.  A  brother died of pancreas cancer in his 36s.  Social History:   He lives with his wife in [place].  He is currently retired.  He previously worked in Scientist, water quality.  He smokes cigarettes and drinks approximately 1 pint of liquor per day.  He has a long history of heavy alcohol use.  No transfusion history.  No risk factor for HIV or hepatitis.  ROS:   Positives include: Neurectomy, 15 pound weight loss, intermittent nausea and vomiting, increased belching, constipation, rectal bleeding when wiping 2 months ago for 3 days, upper/lower abdominal and rectal pain  A complete ROS was otherwise negative.  Physical Exam:  Blood pressure (!) 122/95, pulse 100, temperature 98.2 F (36.8 C), temperature source Temporal, resp. rate 18, height 6\' 1"  (1.854 m), weight 169 lb (76.7 kg), SpO2 100%.  HEENT: Oropharynx without visible mass, neck without mass Lungs: Clear bilaterally Cardiac: Regular rate and rhythm Abdomen: Firm fullness in the mid upper abdomen, no hepatosplenomegaly, tender in the upper abdomen, no mass GU: Testes without mass Vascular: No leg edema Lymph nodes: No cervical, supraclavicular, axillary, or inguinal nodes Neurologic: Alert and oriented, the motor exam appears grossly intact Skin: No rash Musculoskeletal: No spine tenderness   LAB:  CBC  Lab Results  Component Value Date   WBC 7.3 07/19/2023   HGB 13.2 07/19/2023   HCT 39.8 07/19/2023   MCV 96.4 07/19/2023   PLT 318 07/19/2023   NEUTROABS 10.6 (H) 07/16/2023        CMP  Lab Results  Component Value Date   NA 137 07/20/2023   K 4.1 07/20/2023   CL 101 07/20/2023   CO2 27 07/20/2023   GLUCOSE 101 (H) 07/20/2023   BUN 6 07/20/2023   CREATININE 0.91 07/20/2023   CALCIUM 9.5 07/20/2023   PROT 5.7 (L) 07/20/2023   ALBUMIN 2.7 (L) 07/20/2023   AST 16 07/20/2023   ALT 22 07/20/2023   ALKPHOS 73 07/20/2023   BILITOT 0.7 07/20/2023   GFRNONAA >60 07/20/2023   GFRAA >60 07/11/2019     No results  found for: "CEA1", "VHQ469", "CA125"  Imaging:  As per HPI, CT images from 07/16/2023 reviewed with [name] and his wife   Assessment/Plan:   Gastric cancer, stage IV CT abdomen/pelvis 07/16/2023-increased wall thickening at the gastric antrum and pylorus compared to 2016, small to moderate ascites, subcapsular hypodensities at the inferior liver-subcapsular implants?,  Diffuse soft tissue infiltration and haziness throughout the omentum and upper abdominal peritoneal fat Endoscopy 07/18/2023-congested mucosa in the gastric antrum and biopsy biopsied: 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  History of recurrent venous thrombosis-maintained on apixaban anticoagulation 4.  Tobacco and alcohol use 5.  Family history of multiple cancers 6.  Anorexia/weight loss and constipation secondary to #1 7.  Left hip replacement June 2024   Disposition:  [name] has been diagnosed with metastatic gastric cancer.  I discussed the prognosis and treatment options with him.  He understands no therapy will be curative.  We discussed the goals of systemic therapy are to improve symptoms and prolong survival.  He is symptomatic with pain, most likely secondary to the primary gastric mass and carcinomatosis.  We adjusted the narcotic pain regimen today.  He will begin MS Contin and Dilaudid for pain.  He will begin a bowel regimen.  The diagnostic tissue will be submitted for NGS testing, a PD-L1 combined positive score, HER2, and mismatch repair protein testing.  He understands systemic treatment options will depend on results of the above testing.  He will return for an office visit and further discussion on 08/03/2023.  He will  call in the interim for new symptoms.  I recommended he decrease alcohol use and not drink alcohol or drive while taking narcotics.    Thornton Papas, MD  07/25/2023, 2:36 PM

## 2023-07-25 NOTE — Progress Notes (Signed)
[facility] Clinical Social Work  Initial Assessment   [name] is a 62 y.o. year old male. Clinical Social Work was referred by nurse for assessment of psychosocial needs.   SDOH (Social Determinants of Health) assessments performed: No SDOH Interventions    Flowsheet Row Office Visit from 07/25/2023 in [facility] [place]. [facility] Telephone from 07/21/2023 in [place] POPULATION HE[facility] DEPARTMENT  SDOH Interventions    Food Insecurity Interventions Other (Comment)  [Grocery bag from food pantry and CSW referral] Intervention Not Indicated  Housing Interventions Intervention Not Indicated Intervention Not Indicated  Transportation Interventions Intervention Not Indicated --  Utilities Interventions Other (Comment)  [CSW referral] Intervention Not Indicated  Depression Interventions/Treatment  Currently on Treatment  [CSW referral] --       SDOH Screenings   Food Insecurity: Food Insecurity Present (07/25/2023)  Housing: Unknown (07/25/2023)  Transportation Needs: No Transportation Needs (07/25/2023)  Utilities: At Risk (07/25/2023)  Depression (PHQ2-9): Medium Risk (07/25/2023)  Tobacco Use: High Risk (07/18/2023)     Distress Screen completed: No     No data to display            Family/Social Information:  Housing Arrangement: patient lives with his spouse. Family members/support persons in your life? Family and Friends Transportation concerns: no  Employment: Disabled .  Income source: Social Security Disability Financial concerns: Yes, current concerns Type of concern: Utilities and Rent/ mortgage. Patient's wife works part time, but is currently on reduced hours to support husband's care. Food access concerns: yes. Patient / Spouse were informed about food pantry. Religious or spiritual practice: Yes-.  Services Currently in place:  Insurance, Transportation, Family   Coping/ Adjustment to diagnosis: Patient  understands treatment plan and what happens next? yes Concerns about diagnosis and/or treatment: Overwhelmed by information Patient reported stressors: Finances Current coping skills/ strengths: Ability for insight , Active sense of humor , Average or above average intelligence , Capable of independent living , Communication skills , General fund of knowledge , Motivation for treatment/growth , Religious Affiliation , and Supportive family/friends     SUMMARY: Current SDOH Barriers:  Financial constraints related to [employer] / Rent/Mortage  Clinical Social Work Clinical Goal(s):  Explore community resource options for unmet needs related to:  Financial Strain   Interventions: Discussed common feeling and emotions when being diagnosed with cancer, and the importance of support during treatment Informed patient of the support team roles and support services at [facility] Provided CSW contact information and encouraged patient to call with any questions or concerns Provided patient with information about [employer], including eligibility  process of applying, and provided Patient Financial Specialist Everlean Alstrom' phone number. Patient and spouse meet presumptive eligibility (SNAP Benefits). Patient understands to call Everlean Alstrom once active treatment begins.   CSW provided information on [employer] and limited funds and phone number to apply for grant.    Follow Up Plan: Patient will contact CSW with any support or resource needs Patient verbalizes understanding of plan: Yes   Marguerita Merles, LCSW Clinical Social Worker [facility]

## 2023-07-26 ENCOUNTER — Telehealth: Payer: Self-pay | Admitting: *Deleted

## 2023-07-26 ENCOUNTER — Other Ambulatory Visit: Payer: Self-pay

## 2023-07-26 ENCOUNTER — Telehealth: Payer: Self-pay

## 2023-07-26 NOTE — Telephone Encounter (Signed)
Called to inquire if nicotine patches were called in when he left hospital. Confirmed w/[facility] that it was sent in, but not considered a prescription. It is OTC and insurance does not cover it. Mr. Doup notified.

## 2023-07-26 NOTE — Telephone Encounter (Signed)
Notified Patient and Pharmacy of prior authorization approvals for pain medications. Morphine Sulfate 15 mg ER Tablets is approved through 10/23/2023. Hydromorphone 4 mg Tablets is approved through 01/23/2024. No other needs or concerns voiced at this time.

## 2023-07-26 NOTE — Patient Instructions (Signed)
Visit Information  Thank you for taking time to visit with me today.  Keep the positive attitude and optimism!  Please don't hesitate to contact me if I can be of assistance to you before our next scheduled telephone appointment.  Our next appointment is by telephone on 08/04/23 at 10 AM.  Following is a copy of your care plan:   Goals Addressed             This Visit's Progress    Transition of Care Goals       Current Barriers:  Knowledge Deficits related to plan of care for management of  Stomach Cancer Medication management - pain management Diet/Nutrition/Food Resources Provider appointments - H@H  services in the home sched.   RNCM Clinical Goal(s):  Patient will verbalize basic understanding of  cancer diagnosis and self health management plan as evidenced by verbalize understanding of when to notify doctor for questions, concerns or new problems per review of EMR.  demonstrate understanding of rationale for each prescribed medication as evidenced by able to state the name and purpose of his medications. attend all scheduled medical appointments:  All PCP and Specialist appointments kept or re-scheduled through collaboration with RN Care manager, provider, and care team  Interventions: Evaluation of current treatment plan related to  self management and patient's adherence to plan as established by provider. Keep a tablet for questions you would like to ask the doctor for your next appointment and, if able, take a support person with you to take notes while you talk with your doctor.  Patient is very agreeable. Reviewed/discussed writing his medications on a wallet card or in his table for easy recall as he will be asked often to confirm his current medications.  Transitions of Care:  Goal on track:  Yes. Doctor Visits  - discussed the importance of keeping doctor visits and interdisciplinary team appointments (I.e. genetics, Nutritionist, Cancer Navigator, SW) HoH services in  the home sched. For 07/22/23. Patient has a support spouse who works from home; patient is currently on long-term disability leave from his job (S/P Hip Replacement April 2024) Reviewed/discussed patient's medications and he was not able to recall all of them;  complete review of medications, their function and potential side effects done.  Oncology:  (Status: Goal on track:  Yes.) Long Term Goal Assessment of understanding of oncology diagnosis: Stage 4 Stomach Cancer (12/13) Reviewed upcoming provider appointments: Appointment with Dr. Truett Perna, Oncologist, 08/03/23. Treatment plan will be established at his next Oncology appointment after all pathology and labs are back.  Pain Interventions:  (Status:  New goal.) Short Term Goal Pain assessment performed Medications reviewed Reviewed provider established plan for pain management Discussed importance of adherence to all scheduled medical appointments Counseled on the importance of reporting any/all new or changed pain symptoms or management strategies to pain management provider Advised patient to report to care team affect of pain on daily activities Discussed use of relaxation techniques and/or diversional activities to assist with pain reduction (distraction, imagery, relaxation, massage, acupressure, TENS, heat, and cold application  Patient Goals/Self-Care Activities: Participate in Transition of Care Program/Attend TOC scheduled calls Notify RN Care Manager of TOC call rescheduling needs Take all medications as prescribed Attend all scheduled provider appointments Call provider office for new concerns, or changes in your condition or questions regarding medications  Follow Up Plan:  Telephone follow up appointment with care management team member scheduled for:  08/04/23 10 AM. The patient has been provided with contact information for  the care management team and has been advised to call with any health related questions or  concerns.     Wyline Mood BSN, RN RN Care Manager   Transitions of Care VBCI - [facility] [place] Direct Dial Number:  [phone]            Patient verbalizes understanding of instructions and care plan provided today and agrees to view in MyChart. Active MyChart status and patient understanding of how to access instructions and care plan via MyChart confirmed with patient.     Telephone follow up appointment with care management team member scheduled for: The patient has been provided with contact information for the care management team and has been advised to call with any health related questions or concerns.   Please call the care guide team at [phone] if you need to cancel or reschedule your appointment.   Please call the Suicide and Crisis Lifeline: 988 go to [facility] Urgent [facility] [address] [place] [phone]) if you are experiencing a Mental Health or Behavioral Health Crisis or need someone to talk to.  Klarissa Mcilvain Daphine Deutscher BSN, RN RN Care Manager   Transitions of Care VBCI - [facility] Direct Dial Number:  [phone]

## 2023-07-26 NOTE — Patient Outreach (Signed)
Care Management  Transitions of Care Program Transitions of Care Post-discharge week 2   07/26/2023 Name: [name] MRN: [id number] DOB: 02-06-1961  Subjective: [name] is a 62 y.o. year old male who is a primary care patient of Wanda Plump, MD. The Care Management team engaged with patient by telephone to assess and address transitions of care needs.   Consent to Services:  Patient was given information about care management services, agreed to services, and gave verbal consent to participate.   Assessment:   Patient is doing well today.  He is feeling more optimistic after his recent appointments at the [facility].  He asked appropriate questions of me and were answered to his satisfaction. Patient reports his pain and nausea are currently being effectively managed with his current medication regime.  His spouse is very supportive and attends appointments with him.  Next appointment to see Dr. Truett Perna, Oncology is 08/03/23 to further discuss his cancer treatment options. No acute needs identified this telephone contact and will follow-up in a week.       SDOH Interventions    Flowsheet Row Office Visit from 07/25/2023 in [facility] Of [place] Telephone from 07/21/2023 in [place] POPULATION HEALTH DEPARTMENT  SDOH Interventions    Food Insecurity Interventions Other (Comment)  [Grocery bag from food pantry and CSW referral] Intervention Not Indicated  Housing Interventions Intervention Not Indicated Intervention Not Indicated  Transportation Interventions Intervention Not Indicated --  Utilities Interventions Other (Comment)  [CSW referral] Intervention Not Indicated  Depression Interventions/Treatment  Currently on Treatment  [CSW referral] --        Goals Addressed             This Visit's Progress    Transition of Care Goals       Current Barriers:  Knowledge Deficits related to plan  of care for management of  Stomach Cancer Medication management - pain management Diet/Nutrition/Food Resources Provider appointments - H@H  services in the home sched.   RNCM Clinical Goal(s):  Patient will verbalize basic understanding of  cancer diagnosis and self health management plan as evidenced by verbalize understanding of when to notify doctor for questions, concerns or new problems per review of EMR.  demonstrate understanding of rationale for each prescribed medication as evidenced by able to state the name and purpose of his medications. attend all scheduled medical appointments:  All PCP and Specialist appointments kept or re-scheduled through collaboration with RN Care manager, provider, and care team  Interventions: Evaluation of current treatment plan related to  self management and patient's adherence to plan as established by provider. Keep a tablet for questions you would like to ask the doctor for your next appointment and, if able, take a support person with you to take notes while you talk with your doctor.  Patient is very agreeable. Reviewed/discussed writing his medications on a wallet card or in his table for easy recall as he will be asked often to confirm his current medications.  Transitions of Care:  Goal on track:  Yes. Doctor Visits  - discussed the importance of keeping doctor visits and interdisciplinary team appointments (I.e. genetics, Nutritionist, Cancer Navigator, SW) HoH services in the home sched. For 07/22/23. Patient has a support spouse who works from home; patient is currently on long-term disability leave from his job (S/P Hip Replacement April 2024) Reviewed/discussed patient's medications and he was not able to recall all  of them;  complete review of medications, their function and potential side effects done.  Oncology:  (Status: Goal on track:  Yes.) Long Term Goal Assessment of understanding of oncology diagnosis: Stage 4 Stomach Cancer  (12/13) Reviewed upcoming provider appointments: Appointment with Dr. Truett Perna, Oncologist, 08/03/23. Treatment plan will be established at his next Oncology appointment after all pathology and labs are back.  Pain Interventions:  (Status:  New goal.) Short Term Goal Pain assessment performed Medications reviewed Reviewed provider established plan for pain management Discussed importance of adherence to all scheduled medical appointments Counseled on the importance of reporting any/all new or changed pain symptoms or management strategies to pain management provider Advised patient to report to care team affect of pain on daily activities Discussed use of relaxation techniques and/or diversional activities to assist with pain reduction (distraction, imagery, relaxation, massage, acupressure, TENS, heat, and cold application  Patient Goals/Self-Care Activities: Participate in Transition of Care Program/Attend TOC scheduled calls Notify RN Care Manager of TOC call rescheduling needs Take all medications as prescribed Attend all scheduled provider appointments Call provider office for new concerns, or changes in your condition or questions regarding medications  Follow Up Plan:  Telephone follow up appointment with care management team member scheduled for:  08/04/23 10 AM. The patient has been provided with contact information for the care management team and has been advised to call with any health related questions or concerns.     Wyline Mood BSN, RN RN Care Manager   Transitions of Care VBCI - [facility] [place] Direct Dial Number:  [phone]            Plan: Telephone follow up appointment with care management team member scheduled for: 08/04/23 10 AM The patient has been provided with contact information for the care management team and has been advised to call with any health related questions or concerns.   Deleah Tison Daphine Deutscher BSN, RN RN Care Manager    Transitions of Care VBCI - [facility] Direct Dial Number:  [phone]

## 2023-07-27 ENCOUNTER — Ambulatory Visit: Payer: 59 | Admitting: Physical Therapy

## 2023-07-27 LAB — SURGICAL PATHOLOGY

## 2023-08-01 ENCOUNTER — Encounter: Payer: 59 | Admitting: Physical Therapy

## 2023-08-03 ENCOUNTER — Inpatient Hospital Stay: Payer: 59

## 2023-08-03 ENCOUNTER — Other Ambulatory Visit: Payer: Self-pay | Admitting: *Deleted

## 2023-08-03 ENCOUNTER — Encounter: Payer: 59 | Admitting: Physical Therapy

## 2023-08-03 ENCOUNTER — Inpatient Hospital Stay (HOSPITAL_BASED_OUTPATIENT_CLINIC_OR_DEPARTMENT_OTHER): Payer: 59 | Admitting: Oncology

## 2023-08-03 ENCOUNTER — Telehealth: Payer: Self-pay | Admitting: *Deleted

## 2023-08-03 ENCOUNTER — Encounter: Payer: Self-pay | Admitting: *Deleted

## 2023-08-03 VITALS — BP 124/80 | HR 110 | Temp 97.9°F | Resp 18 | Ht 73.0 in | Wt 166.0 lb

## 2023-08-03 DIAGNOSIS — C163 Malignant neoplasm of pyloric antrum: Secondary | ICD-10-CM

## 2023-08-03 DIAGNOSIS — Z8042 Family history of malignant neoplasm of prostate: Secondary | ICD-10-CM | POA: Diagnosis not present

## 2023-08-03 DIAGNOSIS — Z23 Encounter for immunization: Secondary | ICD-10-CM

## 2023-08-03 DIAGNOSIS — C169 Malignant neoplasm of stomach, unspecified: Secondary | ICD-10-CM | POA: Insufficient documentation

## 2023-08-03 DIAGNOSIS — Z96642 Presence of left artificial hip joint: Secondary | ICD-10-CM | POA: Diagnosis not present

## 2023-08-03 DIAGNOSIS — Z800 Family history of malignant neoplasm of digestive organs: Secondary | ICD-10-CM | POA: Diagnosis not present

## 2023-08-03 DIAGNOSIS — Z803 Family history of malignant neoplasm of breast: Secondary | ICD-10-CM | POA: Diagnosis not present

## 2023-08-03 DIAGNOSIS — G893 Neoplasm related pain (acute) (chronic): Secondary | ICD-10-CM | POA: Diagnosis not present

## 2023-08-03 DIAGNOSIS — Z801 Family history of malignant neoplasm of trachea, bronchus and lung: Secondary | ICD-10-CM | POA: Diagnosis not present

## 2023-08-03 DIAGNOSIS — F17210 Nicotine dependence, cigarettes, uncomplicated: Secondary | ICD-10-CM | POA: Diagnosis not present

## 2023-08-03 DIAGNOSIS — Z86718 Personal history of other venous thrombosis and embolism: Secondary | ICD-10-CM | POA: Diagnosis not present

## 2023-08-03 MED ORDER — INFLUENZA VIRUS VACC SPLIT PF (FLUZONE) 0.5 ML IM SUSY
0.5000 mL | PREFILLED_SYRINGE | Freq: Once | INTRAMUSCULAR | Status: AC
  Administered 2023-08-03: 0.5 mL via INTRAMUSCULAR
  Filled 2023-08-03: qty 0.5

## 2023-08-03 NOTE — Progress Notes (Signed)
Email to [facility] Pathology requesting PD-1 CPS score on case [id number] dated 07/19/23. ON 12/27 received email from [facility] that they called FO to add the PD-L1 to that case.

## 2023-08-03 NOTE — Addendum Note (Signed)
Addended by: Rana Snare on: 08/03/2023 11:59 AM   Modules accepted: Orders

## 2023-08-03 NOTE — Progress Notes (Signed)
[facility] OFFICE PROGRESS NOTE   Diagnosis: Gastric cancer  INTERVAL HISTORY:   [name] returns as scheduled.  He is here with his wife and mother.  He reports improvement in pain with MS Contin.  He takes Dilaudid once or twice daily.  He is having bowel movements.  The pain is chiefly in the lower abdomen.  He has decreased alcohol intake.  Objective:  Vital signs in last 24 hours:  Blood pressure (!) 137/110, pulse (!) 110, temperature 97.9 F (36.6 C), temperature source Temporal, resp. rate 18, height 6\' 1"  (1.854 m), weight 166 lb (75.3 kg), SpO2 100%.   Resp: Lungs clear bilaterally Cardio: Regular rate and rhythm GI: Distended, no mass, mild tenderness in the upper and lower abdomen Vascular: No leg edema   Lab Results:  Lab Results  Component Value Date   WBC 7.3 07/19/2023   HGB 13.2 07/19/2023   HCT 39.8 07/19/2023   MCV 96.4 07/19/2023   PLT 318 07/19/2023   NEUTROABS 10.6 (H) 07/16/2023    CMP  Lab Results  Component Value Date   NA 137 07/20/2023   K 4.1 07/20/2023   CL 101 07/20/2023   CO2 27 07/20/2023   GLUCOSE 101 (H) 07/20/2023   BUN 6 07/20/2023   CREATININE 0.91 07/20/2023   CALCIUM 9.5 07/20/2023   PROT 5.7 (L) 07/20/2023   ALBUMIN 2.7 (L) 07/20/2023   AST 16 07/20/2023   ALT 22 07/20/2023   ALKPHOS 73 07/20/2023   BILITOT 0.7 07/20/2023   GFRNONAA >60 07/20/2023   GFRAA >60 07/11/2019    No results found for: "CEA1", "CEA", "CAN199", "CA125"  Lab Results  Component Value Date   INR 1.1 07/19/2023   LABPROT 14.7 07/19/2023    Imaging:  No results found.  Medications: I have reviewed the patient's current medications.   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  History of recurrent venous thrombosis-maintained on apixaban anticoagulation 4.  Tobacco and alcohol use 5.  Family history of multiple cancers 6.  Anorexia/weight loss and constipation secondary to #1 7.  Left hip replacement June 2024    Disposition: [name] has metastatic gastric cancer.  I discussed the prognosis and treatment options with [name] and his family.  No therapy will be curative.  I recommend beginning systemic therapy once a Port-A-Cath is in place.  He is not a candidate for single agent immunotherapy or HER2 directed therapy.  I recommend FOLFOX.  Nivolumab will be added to the FOLFOX regimen if the PD-L1 score returns elevated.  I reviewed potential toxicities associated with the FOLFOX regimen including the chance of nausea/vomiting, mucositis, diarrhea, alopecia, hematologic toxicity, infection, and bleeding.  We discussed the rash, sun sensitivity, hyperpigmentation, hand/foot syndrome, and cardiac toxicity associated with 5-fluorouracil.  We reviewed the allergic reaction and various types of neuropathy seen with oxaliplatin.  He agrees to proceed.  [name] will be referred  for Port-A-Cath placement and a chemotherapy teaching class.  He is scheduled to begin FOLFOX on 08/13/2022.  Nivolumab will be added to the treatment regimen as indicated.  A treatment plan was entered today.  Thornton Papas, MD  08/03/2023  9:45 AM

## 2023-08-03 NOTE — Progress Notes (Signed)
START ON PATHWAY REGIMEN - Gastroesophageal     A cycle is every 14 days:     Oxaliplatin      Leucovorin      Fluorouracil      Fluorouracil   **Always confirm dose/schedule in your pharmacy ordering system**  Patient Characteristics: Distant Metastases (cM1/pM1) / Locally Recurrent Disease, Adenocarcinoma - Esophageal, GE Junction, and Gastric, First Line, HER2 Negative/Unknown, PD?L1 Expression CPS < 5/Negative/Unknown, MSS/pMMR or MSI Unknown Therapeutic Status: Distant Metastases (No Additional Staging) Histology: Adenocarcinoma Disease Classification: Gastric Line of Therapy: First Line HER2 Status: Negative PD-L1 Expression Status: Awaiting Test Results Microsatellite/Mismatch Repair Status: MSS/pMMR Intent of Therapy: Non-Curative / Palliative Intent, Discussed with Patient

## 2023-08-03 NOTE — Telephone Encounter (Signed)
Called [name] with [facility] appointment at [facility] on 08/08/23 at 10:00/12:00. Use main entrance A and proceed to admitting. NPO after midnight, except am meds w/sip. Hold Eliquis that morning. Must have driver and someone to stay with him that day. He understands and agrees.  LOS sent for chemo ed next week and day 1 labs to be collected that day as well.

## 2023-08-04 ENCOUNTER — Telehealth: Payer: Self-pay

## 2023-08-04 ENCOUNTER — Other Ambulatory Visit: Payer: Self-pay

## 2023-08-04 ENCOUNTER — Encounter: Payer: Self-pay | Admitting: Oncology

## 2023-08-04 LAB — MOLECULAR PATHOLOGY

## 2023-08-04 NOTE — Patient Outreach (Signed)
Care Management  Transitions of Care Program Transitions of Care Post-discharge week 3   08/04/2023 Name: [name] MRN: [id number] DOB: [birth year]  Subjective: [name] is a 62 y.o. year old male who is a primary care patient of Wanda Plump, MD. The Care Management team Engaged with patient Engaged with patient by telephone to assess and address transitions of care needs.   Consent to Services:  Patient was given information about care management services, agreed to services, and gave verbal consent to participate.   Assessment:   Patient in good spirits.  Reports last visit with Oncologist 08/03/23 and treatment plan to address is stomach cancer established.  Patient states his spouse and Mother attended the visit with him and asked very appropriate questions and felt their questions with adequately answered.  Patient is hopeful and optimistic with future cancer treatment plan.  Patient states he has increased the fiber in his diet to aid in the constipation caused by his pain medications and this has been successful.  Current pain medication regime has been effective in managing his pain with the MS Contin and Hydromorphone for breakthrough pain.  He is scheduled for port-a-cath placement on 12/32/24 and verbalizes understanding of what to expect.  No acute or unmet needs identified at this time.        SDOH Interventions    Flowsheet Row Office Visit from 07/25/2023 in [facility] Of [place] Telephone from 07/21/2023 in [place] POPULATION HEALTH DEPARTMENT  SDOH Interventions    Food Insecurity Interventions Other (Comment)  [Grocery bag from food pantry and CSW referral] Intervention Not Indicated  Housing Interventions Intervention Not Indicated Intervention Not Indicated  Transportation Interventions Intervention Not Indicated --  Utilities Interventions Other (Comment)  [CSW referral] Intervention Not Indicated   Depression Interventions/Treatment  Currently on Treatment  [CSW referral] --        Goals Addressed             This Visit's Progress    Transition of Care Goals       Current Barriers:  Knowledge Deficits related to plan of care for treatments and self-care management r/t recent Stomach Cancer diagnosis Medication management - pain management Diet/Nutrition/Food Resources Provider appointments - PCP, Specialist  RNCM Clinical Goal(s):  Patient will verbalize basic understanding of  cancer diagnosis and self health management plan as evidenced by asking appropriate care questions and verbalize understanding of when to notify doctor for questions, concerns or new problems per review of EMR.  Demonstrate understanding of new cancer treatments and self-care management to minimize side effects from chemotherapy treatments Attend all scheduled medical appointments:  All PCP and Specialist appointments kept or re-scheduled through collaboration with RN Care manager, provider, and care team  Interventions: Evaluation of current treatment plan related to  self management and patient's adherence to plan as established by provider. Keep a tablet for questions you would like to ask the doctor for your next appointment and, if able, take a support person with you to take notes while you talk with your doctor.  Patient is very agreeable. Reviewed/discussed what to expect for scheduled surgery on 08/08/23 for placement of port-a-cath in preparation for chemotherapy treatments  Transitions of Care:  Goal on track:  Yes. Doctor Visits  - discussed the importance of keeping doctor visits and interdisciplinary team appointments (I.e. genetics, Nutritionist, Cancer Navigator, SW) Patient has a very supportive spouse, mother, children and friends who  attend/will attend future appointments and treatments.   Assessed financial resources or any financial barriers to adherence to treatment plan:  Patient  reports recently approved for Disability Medicare and has Medicaid.  Has transportation to appointments and treatments and denies any unmet needs at this time Answered patient's questions regarding GERD medication, pain medications and Miralax.  Verbalized understanding.  Oncology:  (Status: Goal on track:  Yes.) Long Term Goal Assessed patient understanding of upcoming cancer treatments, provider appointments and scheduled surgery for placement of port-A-Cath. Patient expressed good understanding of upcoming treatments, surgery and appointments and asked very appropriate questions. Reviewed upcoming provider appointments: Next Oncology appointment 08/16/2023 for first chemotherapy.  Pain Interventions:  (Status:  Goal on track:  Yes.) Long Term Goal Pain assessment performed - Patient reports severe abdominal pain on Christmas day.  Reports pain today, 08/04/23, is much improved.  Has orders for MS Contin every 12 hours with Hydromorphone as needed for breakthrough pain. Counseled on the importance of reporting pain symptoms that are no longer managed by current pain medication regime.   Patient Goals/Self-Care Activities: Participate in Transition of Care Program/Attend [facility] scheduled calls Notify RN Care Manager of TOC call rescheduling needs Take all medications as prescribed Attend all scheduled provider appointments Call provider office for new concerns, or changes in your condition or questions regarding medications  Follow Up Plan:  Telephone follow up appointment with care management team member scheduled for:  08/11/2023 at 10 AM The patient has been provided with contact information for the care management team and has been advised to call with any health related questions or concerns.     Wyline Mood BSN, RN RN Care Manager   Transitions of Care VBCI - Population [facility] [place] Direct Dial Number:  [phone]            Plan: Telephone follow up appointment with  care management team member scheduled for:  08/11/23 at 10 AM The patient has been provided with contact information for the care management team and has been advised to call with any health related questions or concerns. Sabirin Baray Daphine Deutscher BSN, RN RN Care Manager   Transitions of Care VBCI - [facility] Direct Dial Number:  [phone]

## 2023-08-04 NOTE — Patient Instructions (Signed)
Visit Information  Thank you for taking time to visit with me today. Please don't hesitate to contact me if I can be of assistance to you before our next scheduled telephone appointment.  Our next appointment is by telephone on 1.3.2025 at 10 AM  Following is a copy of your care plan:   Goals Addressed             This Visit's Progress    Transition of Care Goals       Current Barriers:  Knowledge Deficits related to plan of care for treatments and self-care management r/t recent Stomach Cancer diagnosis Medication management - pain management Diet/Nutrition/Food Resources Provider appointments - PCP, Specialist  RNCM Clinical Goal(s):  Patient will verbalize basic understanding of  cancer diagnosis and self health management plan as evidenced by asking appropriate care questions and verbalize understanding of when to notify doctor for questions, concerns or new problems per review of EMR.  Demonstrate understanding of new cancer treatments and self-care management to minimize side effects from chemotherapy treatments Attend all scheduled medical appointments:  All PCP and Specialist appointments kept or re-scheduled through collaboration with RN Care manager, provider, and care team  Interventions: Evaluation of current treatment plan related to  self management and patient's adherence to plan as established by provider. Keep a tablet for questions you would like to ask the doctor for your next appointment and, if able, take a support person with you to take notes while you talk with your doctor.  Patient is very agreeable. Reviewed/discussed what to expect for scheduled surgery on 08/08/23 for placement of port-a-cath in preparation for chemotherapy treatments  Transitions of Care:  Goal on track:  Yes. Doctor Visits  - discussed the importance of keeping doctor visits and interdisciplinary team appointments (I.e. genetics, Nutritionist, Cancer Navigator, SW) Patient has a very  supportive spouse, mother, children and friends who attend/will attend future appointments and treatments.   Assessed financial resources or any financial barriers to adherence to treatment plan:  Patient reports recently approved for Disability Medicare and has Medicaid.  Has transportation to appointments and treatments and denies any unmet needs at this time Answered patient's questions regarding GERD medication, pain medications and Miralax.  Verbalized understanding.  Oncology:  (Status: Goal on track:  Yes.) Long Term Goal Assessed patient understanding of upcoming cancer treatments, provider appointments and scheduled surgery for placement of port-A-Cath. Patient expressed good understanding of upcoming treatments, surgery and appointments and asked very appropriate questions. Reviewed upcoming provider appointments: Next Oncology appointment 08/16/2023 for first chemotherapy.  Pain Interventions:  (Status:  Goal on track:  Yes.) Long Term Goal Pain assessment performed - Patient reports severe abdominal pain on Christmas day.  Reports pain today, 08/04/23, is much improved.  Has orders for MS Contin every 12 hours with Hydromorphone as needed for breakthrough pain. Counseled on the importance of reporting pain symptoms that are no longer managed by current pain medication regime.   Patient Goals/Self-Care Activities: Participate in Transition of Care Program/Attend [facility] scheduled calls Notify RN Care Manager of TOC call rescheduling needs Take all medications as prescribed Attend all scheduled provider appointments Call provider office for new concerns, or changes in your condition or questions regarding medications  Follow Up Plan:  Telephone follow up appointment with care management team member scheduled for:  08/11/2023 at 10 AM The patient has been provided with contact information for the care management team and has been advised to call with any health related questions or concerns.  Wyline Mood BSN, RN RN Care Manager   Transitions of Care [facility] - [facility] [place] Direct Dial Number:  [phone]            Patient verbalizes understanding of instructions and care plan provided today and agrees to view in MyChart. Active MyChart status and patient understanding of how to access instructions and care plan via MyChart confirmed with patient.     Telephone follow up appointment with care management team member scheduled for: 08/11/23 at 10 Am The patient has been provided with contact information for the care management team and has been advised to call with any health related questions or concerns.   Please call the care guide team at [phone] if you need to cancel or reschedule your appointment.   Please call the Suicide and Crisis Lifeline: 988 go to [facility] [address], [place] [phone]) if you are experiencing a Mental Health or Behavioral Health Crisis or need someone to talk to.  Prem Coykendall Daphine Deutscher BSN, RN RN Care Manager   Transitions of Care [facility] - [facility] Direct Dial Number:  [phone]

## 2023-08-07 ENCOUNTER — Telehealth: Payer: Self-pay | Admitting: Oncology

## 2023-08-07 ENCOUNTER — Other Ambulatory Visit: Payer: Self-pay | Admitting: Physician Assistant

## 2023-08-07 ENCOUNTER — Encounter (HOSPITAL_COMMUNITY): Payer: Self-pay

## 2023-08-07 DIAGNOSIS — Z01818 Encounter for other preprocedural examination: Secondary | ICD-10-CM

## 2023-08-07 NOTE — Telephone Encounter (Signed)
Called and scheduled appointments per scheduling message. Patient is aware of the made appointments.

## 2023-08-07 NOTE — Patient Outreach (Signed)
  Care Coordination   Follow Up Visit Note   08/07/2023 Name: [name] MRN: [id number] DOB: September 11, 1960  [name] is a 62 y.o. year old male who sees Drue Novel, Nolon Rod, MD for primary care. I spoke with  [name] by phone 08/04/23 AT 12:12 PM to follow-up from [facility] program visit that morning to advise/clarif, per MyChart/Epic record review, his lab work and chemotherapy education class is actually scheduled for 08/11/2023 at 9:45 AM.  Patient verbalized understanding and he, his wife and mother will plan to attend as scheduled.  RNCM confirmed he would be available for our scheduled telephone visit for 08/11/23 at 3 PM.    What matters to the patients health and wellness today?     Goals Addressed    - Attend all provider appointments as scheduled in collaboration with RNCM, Providers and Care Team.    SDOH assessments and interventions completed:  Yes No barriers identified.  Very supportive family who attend and/or provide transportation to medical appointments.    Care Coordination Interventions:  Yes, provided  -Reviewed/discussed upcoming provider appointments as listed above..  Follow up plan: Follow up call scheduled for 08/11/2023 at 3 PM.    Encounter Outcome:  Patient Visit Completed   [name] Daphine Deutscher BSN, RN RN Care Manager   Transitions of Care [facility] - [facility] Direct Dial Number:  [phone]

## 2023-08-08 ENCOUNTER — Ambulatory Visit (HOSPITAL_COMMUNITY)
Admission: RE | Admit: 2023-08-08 | Discharge: 2023-08-08 | Disposition: A | Discharge status: Home / Self Care | Payer: 59 | Source: Ambulatory Visit | Attending: Nurse Practitioner | Admitting: Nurse Practitioner

## 2023-08-08 ENCOUNTER — Other Ambulatory Visit: Payer: Self-pay | Admitting: Internal Medicine

## 2023-08-08 ENCOUNTER — Encounter: Payer: 59 | Admitting: Physical Therapy

## 2023-08-08 ENCOUNTER — Other Ambulatory Visit: Payer: Self-pay

## 2023-08-08 DIAGNOSIS — C163 Malignant neoplasm of pyloric antrum: Secondary | ICD-10-CM | POA: Diagnosis not present

## 2023-08-08 DIAGNOSIS — Z01818 Encounter for other preprocedural examination: Secondary | ICD-10-CM

## 2023-08-08 DIAGNOSIS — C169 Malignant neoplasm of stomach, unspecified: Secondary | ICD-10-CM | POA: Diagnosis not present

## 2023-08-08 HISTORY — PX: IR IMAGING GUIDED PORT INSERTION: IMG5740

## 2023-08-08 MED ORDER — HEPARIN SOD (PORK) LOCK FLUSH 100 UNIT/ML IV SOLN
INTRAVENOUS | Status: AC
  Filled 2023-08-08: qty 5

## 2023-08-08 MED ORDER — LIDOCAINE-EPINEPHRINE 1 %-1:100000 IJ SOLN
INTRAMUSCULAR | Status: AC
Start: 2023-08-08 — End: ?
  Filled 2023-08-08: qty 1

## 2023-08-08 MED ORDER — MIDAZOLAM HCL 2 MG/2ML IJ SOLN
INTRAMUSCULAR | Status: AC | PRN
  Administered 2023-08-08 (×2): 1 mg via INTRAVENOUS
  Administered 2023-08-08: .5 mg via INTRAVENOUS

## 2023-08-08 MED ORDER — FENTANYL CITRATE (PF) 100 MCG/2ML IJ SOLN
INTRAMUSCULAR | Status: AC | PRN
  Administered 2023-08-08 (×2): 50 ug via INTRAVENOUS

## 2023-08-08 MED ORDER — FENTANYL CITRATE (PF) 100 MCG/2ML IJ SOLN
INTRAMUSCULAR | Status: AC
  Filled 2023-08-08: qty 4

## 2023-08-08 MED ORDER — MIDAZOLAM HCL 2 MG/2ML IJ SOLN
INTRAMUSCULAR | Status: AC
  Filled 2023-08-08: qty 4

## 2023-08-08 MED ORDER — LIDOCAINE-EPINEPHRINE 1 %-1:100000 IJ SOLN: 20.0000 mL | Freq: Once | INTRAMUSCULAR | Status: DC

## 2023-08-08 NOTE — H&P (Addendum)
 Chief Complaint: Patient was seen in consultation today for port placement   Referring Physician(s): Debby Olam POUR  Supervising Physician: Sutton Hirsch, Aliene  Patient Status: [facility] - Out-pt  History of Present Illness: [name] is a 62 y.o. male with metastatic gastric cancer. He is to start chemotherapy soon and is referred for port placement.  PMHx, meds, labs, imaging, allergies reviewed. Feels well, no recent fevers, chills, illness. Has been NPO today as directed.   Past Medical History:  Diagnosis Date   Chest pain    stres test neg x 2, cath 5/12; minimal LAD irregs; no obs CAD; normal LVF   Clotting disorder (HCC)    dvt   Deep vein thrombosis (HCC) 10/06/2010   GERD (gastroesophageal reflux disease)    History of DVT of lower extremity    on chronic coumadin    Hypertension    Internal hemorrhoids    Cscope 2007   PAD (peripheral artery disease) (HCC)    a. left leg ischemia tx wtih embolectomy of left fem, pop, tib arteries with Dr. Eliza - 08/2006;  b. known occlusion of right pop with collats - med Rx;  c.ABI's 8/09: R 1.0; L 0.91    PFO (patent foramen ovale)    per 08/2006 discharge summary, TEE showed EF 60%, small PFO with minimal right-to-left shunt with Valsalva; not felt to be the source of emboli, lifelong Coumadin  recommended   Pneumonia    history of   Polysubstance abuse (HCC)    marijuana only   Renal infarct [facility])    R    Past Surgical History:  Procedure Laterality Date   BIOPSY  07/18/2023   Procedure: BIOPSY;  Surgeon: Legrand Victory LITTIE DOUGLAS, MD;  Location: [facility] ENDOSCOPY;  Service: Gastroenterology;;   CHOLECYSTECTOMY     COLONOSCOPY     ESOPHAGOGASTRODUODENOSCOPY (EGD) WITH PROPOFOL  N/A 07/18/2023   Procedure: ESOPHAGOGASTRODUODENOSCOPY (EGD) WITH PROPOFOL ;  Surgeon: Legrand Victory LITTIE DOUGLAS, MD;  Location: [facility] ENDOSCOPY;  Service: Gastroenterology;  Laterality: N/A;   INGUINAL HERNIA REPAIR Left 07/16/2019   Procedure: LAPAROSCOPIC LEFT  INGUINAL HERNIA REPAIR WITH MESH;  Surgeon: Rubin Calamity, MD;  Location: [facility] OR;  Service: General;  Laterality: Left;   IR PARACENTESIS  07/17/2023   left leg blood clot removal 2009  2009   TONSILLECTOMY     TOTAL HIP ARTHROPLASTY Left 02/03/2023   Procedure: LEFT TOTAL HIP ARTHROPLASTY ANTERIOR APPROACH;  Surgeon: Jerri Kay HERO, MD;  Location: [facility] OR;  Service: Orthopedics;  Laterality: Left;  3-C   WRIST SURGERY Left     Allergies: Pravastatin and Simvastatin  Medications: Prior to Admission medications   Medication Sig Start Date End Date Taking? Authorizing Provider  apixaban  (ELIQUIS ) 5 MG TABS tablet Take 1 tablet by mouth twice daily 04/21/23  Yes Wonda Ozell, MD  folic acid  (FOLVITE ) 1 MG tablet Take 1 tablet (1 mg total) by mouth daily. 07/21/23  Yes Raenelle Coria, MD  HYDROmorphone  (DILAUDID ) 4 MG tablet Take 1-2 tablets (4-8 mg total) by mouth every 4 (four) hours as needed for severe pain (pain score 7-10). 07/25/23  Yes Cloretta Arley NOVAK, MD  morphine  (MS CONTIN ) 15 MG 12 hr tablet Take 1 tablet (15 mg total) by mouth every 12 (twelve) hours. 07/25/23  Yes Cloretta Arley NOVAK, MD  pantoprazole  (PROTONIX ) 40 MG tablet Take 1 tablet (40 mg total) by mouth 2 (two) times daily. 07/20/23  Yes Ghimire, Kuber, MD  polyethylene glycol (MIRALAX  / GLYCOLAX ) 17 g packet Take 17  g by mouth daily as needed for mild constipation. 07/20/23  Yes Raenelle Coria, MD  thiamine  (VITAMIN B-1) 100 MG tablet Take 1 tablet (100 mg total) by mouth daily. 07/21/23  Yes Raenelle Coria, MD  EPINEPHrine  (EPIPEN  2-PAK) 0.3 mg/0.3 mL IJ SOAJ injection Inject 0.3 mg into the muscle as needed for anaphylaxis. 12/19/22   [name], MD  nicotine  (NICODERM CQ  - DOSED IN MG/24 HOURS) 14 mg/24hr patch Place 1 patch (14 mg total) onto the skin daily. Patient not taking: Reported on 08/04/2023 07/21/23   Raenelle Coria, MD     Family History  Problem Relation Age of Onset   Hypertension Mother    Breast cancer  Mother    Hypertension Father    CAD Father    Diabetes Father    Lung cancer Maternal Uncle    Pancreatic cancer Brother    Prostate cancer Maternal Uncle    Heart disease Paternal Grandmother    Colon cancer Neg Hx    Stomach cancer Neg Hx    Esophageal cancer Neg Hx    Rectal cancer Neg Hx     Social History   Socioeconomic History   Marital status: Married    Spouse name: Not on file   Number of children: 2   Years of education: Not on file   Highest education level: Not on file  Occupational History   Occupation: [employer], driver   Tobacco Use   Smoking status: Every Day    Current packs/day: 1.00    Average packs/day: 1 pack/day for 30.0 years (30.0 ttl pk-yrs)    Types: Cigarettes   Smokeless tobacco: Never   Tobacco comments:    1 to 1.5 ppd   Vaping Use   Vaping status: Never Used  Substance and Sexual Activity   Alcohol use: Yes    Alcohol/week: 21.0 standard drinks of alcohol    Types: 21 Standard drinks or equivalent per week    Comment: 3 drinks per day, every day   Drug use: Yes    Types: Marijuana    Comment: smokes every day   Sexual activity: Yes    Partners: Female  Other Topics Concern   Not on file  Social History Narrative   1 child lives w/ them   Social Drivers of Health   Financial Resource Strain: Not on file  Food Insecurity: Food Insecurity Present (07/25/2023)   Hunger Vital Sign    Worried About Running Out of Food in the Last Year: Sometimes true    Ran Out of Food in the Last Year: Sometimes true  Transportation Needs: No Transportation Needs (07/25/2023)   PRAPARE - Administrator, Civil Service (Medical): No    Lack of Transportation (Non-Medical): No  Physical Activity: Not on file  Stress: Not on file  Social Connections: Not on file    Review of Systems: A 12 point ROS discussed and pertinent positives are indicated in the HPI above.  All other systems are negative.  Review of Systems  Vital Signs: BP  (!) 123/101   Pulse (!) 115   Temp 99 F (37.2 C) (Oral)   Resp 17   Ht 6' 1 (1.854 m)   Wt 170 lb (77.1 kg)   SpO2 96%   BMI 22.43 kg/m   Physical Exam Constitutional:      Appearance: He is not ill-appearing.  HENT:     Mouth/Throat:     Mouth: Mucous membranes are moist.  Pharynx: Oropharynx is clear.  Cardiovascular:     Rate and Rhythm: Normal rate and regular rhythm.     Heart sounds: Normal heart sounds.  Pulmonary:     Effort: Pulmonary effort is normal. No respiratory distress.     Breath sounds: Normal breath sounds.  Skin:    General: Skin is warm and dry.  Neurological:     General: No focal deficit present.     Mental Status: He is alert and oriented to person, place, and time.  Psychiatric:        Mood and Affect: Mood normal.        Thought Content: Thought content normal.     Imaging: CT BIOPSY Result Date: 07/19/2023 INDICATION: No known primary, now with the omental thickening of uncertain etiology. Please perform image guided biopsy for tissue diagnostic purposes. EXAM: CT-GUIDED OMENTAL THICKENING BIOPSY COMPARISON:  CT abdomen and pelvis-07/16/2023 MEDICATIONS: None. ANESTHESIA/SEDATION: Moderate (conscious) sedation was employed during this procedure as administered by the Interventional Radiology RN. A total of Versed  2 mg and Fentanyl  100 mcg was administered intravenously. Moderate Sedation Time: 10 minutes. The patient's level of consciousness and vital signs were monitored continuously by radiology nursing throughout the procedure under my direct supervision. CONTRAST:  None. COMPLICATIONS: None immediate. PROCEDURE: Informed consent was obtained from the patient following an explanation of the procedure, risks, benefits and alternatives. A time out was performed prior to the initiation of the procedure. The patient was positioned supine on the CT table and a limited CT was performed for procedural planning demonstrating similar appearance of  ill-defined omental thickening. Dominant omental thickening within the right upper abdominal quadrant was targeted for biopsy. The procedure was planned. The operative site was prepped and draped in the usual sterile fashion. Appropriate trajectory was confirmed with a 22 gauge spinal needle after the adjacent tissues were anesthetized with 1% Lidocaine  with epinephrine . Under intermittent CT guidance, a 17 gauge coaxial needle was advanced into the peripheral aspect of the omental thickening within the right upper abdominal quadrant (representative image 8, series 4). Appropriate positioning was confirmed and 5 core needle biopsy samples were obtained with an 18 gauge core needle biopsy device. The co-axial needle was removed and hemostasis was achieved with manual compression. A dressing was placed. The patient tolerated the procedure well without immediate postprocedural complication. IMPRESSION: Technically successful CT guided core needle biopsy of indeterminate omental thickening within the right upper abdominal quadrant. If this biopsy proves nondiagnostic, would recommend further evaluation with PET-CT to evaluate for potential areas of hypermetabolic metabolism for additional diagnostic biopsy targets. Electronically Signed   By: Norleen Roulette M.D.   On: 07/19/2023 16:03   IR Paracentesis Result Date: 07/17/2023 INDICATION: 62 year old male with abdominal pain, small volume ascites. Diagnostic paracentesis requested. EXAM: ULTRASOUND GUIDED DIAGNOSTIC PARACENTESIS MEDICATIONS: 10 mL 1% lidocaine  COMPLICATIONS: None immediate. PROCEDURE: Informed written consent was obtained from the patient after a discussion of the risks, benefits and alternatives to treatment. A timeout was performed prior to the initiation of the procedure. Initial ultrasound scanning demonstrates a large amount of ascites within the right lower abdominal quadrant. The right lower abdomen was prepped and draped in the usual sterile  fashion. 1% lidocaine  was used for local anesthesia. Following this, a 19 gauge, 7-cm, Yueh catheter was introduced. An ultrasound image was saved for documentation purposes. The paracentesis was performed. The catheter was removed and a dressing was applied. The patient tolerated the procedure well without immediate post procedural complication.  Patient received post-procedure intravenous albumin ; see nursing notes for details. FINDINGS: A total of approximately 120 mL of yellow, clear fluid was removed. Samples were sent to the laboratory as requested by the clinical team. IMPRESSION: Successful ultrasound-guided paracentesis yielding 120 mL of peritoneal fluid. Performed by: Kacie Matthews PA-C Electronically Signed   By: Ami Bellman D.O.   On: 07/17/2023 10:36   CT ABDOMEN PELVIS W CONTRAST Result Date: 07/16/2023 CLINICAL DATA:  Abdominal pain. Pain radiates to pelvis/groin region. Abdominal distension and emesis. * Tracking Code: BO * EXAM: CT ABDOMEN AND PELVIS WITH CONTRAST TECHNIQUE: Multidetector CT imaging of the abdomen and pelvis was performed using the standard protocol following bolus administration of intravenous contrast. RADIATION DOSE REDUCTION: This exam was performed according to the departmental dose-optimization program which includes automated exposure control, adjustment of the mA and/or kV according to patient size and/or use of iterative reconstruction technique. CONTRAST:  75mL OMNIPAQUE  IOHEXOL  350 MG/ML SOLN COMPARISON:  07/12/2015 FINDINGS: Lower chest: No acute abnormality. Chronic postinflammatory changes within the periphery of the lung bases with subpleural reticulation and banding. Centrilobular emphysema. Hepatobiliary: Multiple subcapsular hypodensities are identified along the inferior margin of the liver which are new when compared with previous exam, image 69/6, image 64/6 and image 79/6. The largest is along the inferior margin of the right lobe measuring 1.8 x 1.5 cm,  image 20 8/3. No distinct soft tissue nodule or mass identified. Status post cholecystectomy. No significant bile duct dilatation. Pancreas: Unremarkable. No pancreatic ductal dilatation or surrounding inflammatory changes. Spleen: Normal in size without focal abnormality. Adrenals/Urinary Tract: Normal adrenal glands. No nephrolithiasis or obstructive uropathy identified bilaterally. Small subcentimeter hypodense kidney lesions are identified which are technically too small to characterize and compatible with Bosniak class 2 lesions. No follow-up imaging recommended. Scarring is noted along the posterior cortex of the right kidney. Urinary bladder appears normal. Stomach/Bowel: Increased wall thickening within the gastric antrum and pylorus is identified when compared with the examination from 07/12/2015. This is suboptimally visualized without intraluminal contrast material. The appendix is visualized and appears normal. No pathologic dilatation of the large or small bowel loops. Colonic diverticulosis without signs of acute diverticulitis. No bowel wall thickening or inflammation. Vascular/Lymphatic: Aortic atherosclerosis. No aneurysm. Patent portal vein and upper abdominal vascularity. No signs abdominopelvic adenopathy. Reproductive: Prostate is unremarkable. Other: There is a small to moderate volume of ascites. There is diffuse soft tissue infiltration and haziness identified throughout the omentum and upper abdominal peritoneal fat, image 88/6 and image 33/3. Musculoskeletal: Previous left hip arthroplasty. No aggressive lytic or sclerotic bone lesions. Bilateral L5 pars defects identified. IMPRESSION: 1. There is a small to moderate volume of ascites. There is diffuse soft tissue infiltration and haziness identified throughout the omentum and upper abdominal peritoneal fat. The diagnosis of exclusion would be peritoneal carcinomatosis. 2. Multiple subcapsular hypodensities are identified along the inferior  margin of the liver which are new when compared with previous exam. The largest is along the inferior margin of the right lobe measuring 1.8 x 1.5 cm. No distinct soft tissue nodule or mass identified. In the setting of peritoneal disease 1 subcapsular implants cannot be excluded. More definitive characterization may be of chain with contrast enhanced liver MRI. 3. Increased wall thickening within the gastric antrum and pylorus is identified when compared with the examination from 07/12/2015. This is suboptimally visualized without intraluminal contrast material. This may also be better assessed with contrast enhanced MRI versus direct visualization. 4. Colonic diverticulosis without signs of  acute diverticulitis. 5. Aortic Atherosclerosis (ICD10-I70.0) and Emphysema (ICD10-J43.9). Electronically Signed   By: Waddell Calk M.D.   On: 07/16/2023 10:53    Labs:  CBC: Recent Labs    02/04/23 0629 07/16/23 0918 07/17/23 0647 07/19/23 1156  WBC 15.7* 12.4* 6.8 7.3  HGB 12.5* 15.7 11.8* 13.2  HCT 36.7* 46.4 35.2* 39.8  PLT 260 309 230 318    COAGS: Recent Labs    07/16/23 0918 07/19/23 0629  INR  --  1.1  APTT 25  --     BMP: Recent Labs    01/26/23 0848 07/16/23 0918 07/17/23 0647 07/20/23 0624  NA 137 136 133* 137  K 3.1* 3.4* 3.4* 4.1  CL 99 101 101 101  CO2 28 20* 24 27  GLUCOSE 98 96 81 101*  BUN 14 12 10 6   CALCIUM  9.8 9.9 8.7* 9.5  CREATININE 0.75 1.15 1.02 0.91  GFRNONAA >60 >60 >60 >60    LIVER FUNCTION TESTS: Recent Labs    01/26/23 0848 07/16/23 0918 07/17/23 0647 07/20/23 0624  BILITOT 0.6 1.1 1.1 0.7  AST 29 33 85* 16  ALT 29 33 46* 22  ALKPHOS 136* 90 83 73  PROT 6.9 7.1 5.4* 5.7*  ALBUMIN  3.6 4.2 2.9* 2.7*     Assessment and Plan: Metastatic gastric cancer Plan for port placement. Risks and benefits of image guided port-a-catheter placement was discussed with the patient including, but not limited to bleeding, infection, pneumothorax, or  fibrin sheath development and need for additional procedures.  All of the patient's questions were answered, patient is agreeable to proceed. Consent signed and in chart.    Electronically Signed: Franky Rusk, PA-C 08/08/2023, 10:20 AM   I spent a total of 20 minutes in face to face in clinical consultation, greater than 50% of which was counseling/coordinating care for port placement  Agree with note and plan of care.  Aliene Ottis Sarnowski, MD [phone]

## 2023-08-08 NOTE — Procedures (Signed)
Interventional Radiology Procedure Note  Procedure: Port placement.  Indication: Gastric Ca  Findings: Please refer to procedural dictation for full description.  Complications: None  EBL: < 10 mL  Miachel Roux, MD [phone]

## 2023-08-10 ENCOUNTER — Telehealth: Payer: Self-pay | Admitting: Cardiovascular Disease

## 2023-08-10 ENCOUNTER — Encounter: Payer: 59 | Admitting: Physical Therapy

## 2023-08-10 ENCOUNTER — Other Ambulatory Visit: Payer: Self-pay | Admitting: Oncology

## 2023-08-10 NOTE — Telephone Encounter (Signed)
 Pt c/o medication issue:  1. Name of Medication: lisinopril  (ZESTRIL ) 10 MG table   2. How are you currently taking this medication (dosage and times per day)? Take 10 mg by mouth daily.   3. Are you having a reaction (difficulty breathing--STAT)? No  4. What is your medication issue? Patient is requesting to speak with the nurse in regards to if he should be taking this medication or not. Please advise.

## 2023-08-10 NOTE — Telephone Encounter (Signed)
 Per OV note on 05/18/23:  Essential hypertension Blood pressure controlled on amlodipine   Returned call to patient to let him know that we don't have Lisinopril  on his chart. Note in chart on Rx from may 2024 shows pt had angioedema. Informed pt of this and he states okay. Asked how his BP has been and he states good and he's not been on it for about a week. He will continue to monitor and let us  know if any issues.

## 2023-08-11 ENCOUNTER — Inpatient Hospital Stay: Payer: 59 | Attending: Oncology

## 2023-08-11 ENCOUNTER — Other Ambulatory Visit: Payer: Self-pay

## 2023-08-11 ENCOUNTER — Other Ambulatory Visit: Payer: 59

## 2023-08-11 ENCOUNTER — Other Ambulatory Visit: Payer: Self-pay | Admitting: *Deleted

## 2023-08-11 ENCOUNTER — Other Ambulatory Visit: Payer: Self-pay | Admitting: Nurse Practitioner

## 2023-08-11 ENCOUNTER — Inpatient Hospital Stay: Payer: Medicaid Other

## 2023-08-11 DIAGNOSIS — C169 Malignant neoplasm of stomach, unspecified: Secondary | ICD-10-CM | POA: Insufficient documentation

## 2023-08-11 DIAGNOSIS — C786 Secondary malignant neoplasm of retroperitoneum and peritoneum: Secondary | ICD-10-CM | POA: Diagnosis not present

## 2023-08-11 DIAGNOSIS — E876 Hypokalemia: Secondary | ICD-10-CM | POA: Insufficient documentation

## 2023-08-11 DIAGNOSIS — Z5112 Encounter for antineoplastic immunotherapy: Secondary | ICD-10-CM | POA: Insufficient documentation

## 2023-08-11 DIAGNOSIS — Z5111 Encounter for antineoplastic chemotherapy: Secondary | ICD-10-CM | POA: Diagnosis present

## 2023-08-11 DIAGNOSIS — C163 Malignant neoplasm of pyloric antrum: Secondary | ICD-10-CM

## 2023-08-11 LAB — CMP (CANCER CENTER ONLY)
ALT: 9 U/L (ref 0–44)
AST: 13 U/L — ABNORMAL LOW (ref 15–41)
Albumin: 4.3 g/dL (ref 3.5–5.0)
Alkaline Phosphatase: 73 U/L (ref 38–126)
Anion gap: 13 (ref 5–15)
BUN: 11 mg/dL (ref 6–20)
CO2: 24 mmol/L (ref 22–32)
Calcium: 10.6 mg/dL — ABNORMAL HIGH (ref 8.9–10.3)
Chloride: 100 mmol/L (ref 98–111)
Creatinine: 0.9 mg/dL (ref 0.61–1.24)
GFR, Estimated: >60 mL/min (ref >60)
Glucose, Bld: 103 mg/dL — ABNORMAL HIGH (ref 70–99)
Potassium: 4.7 mmol/L (ref 3.5–5.1)
Sodium: 137 mmol/L (ref 135–145)
Total Bilirubin: 0.5 mg/dL (ref 0.0–1.2)
Total Protein: 7.6 g/dL (ref 6.5–8.1)

## 2023-08-11 LAB — CBC WITH DIFFERENTIAL (CANCER CENTER ONLY)
Abs Immature Granulocytes: 0.01 10*3/uL (ref 0.00–0.07)
Basophils Absolute: 0 10*3/uL (ref 0.0–0.1)
Basophils Relative: 0 %
Eosinophils Absolute: 0.1 10*3/uL (ref 0.0–0.5)
Eosinophils Relative: 1 %
HCT: 44.9 % (ref 39.0–52.0)
Hemoglobin: 15.3 g/dL (ref 13.0–17.0)
Immature Granulocytes: 0 %
Lymphocytes Relative: 21 %
Lymphs Abs: 1.3 10*3/uL (ref 0.7–4.0)
MCH: 31.8 pg (ref 26.0–34.0)
MCHC: 34.1 g/dL (ref 30.0–36.0)
MCV: 93.3 fL (ref 80.0–100.0)
Monocytes Absolute: 0.4 10*3/uL (ref 0.1–1.0)
Monocytes Relative: 7 %
Neutro Abs: 4.5 10*3/uL (ref 1.7–7.7)
Neutrophils Relative %: 71 %
Platelet Count: 360 10*3/uL (ref 150–400)
RBC: 4.81 MIL/uL (ref 4.22–5.81)
RDW: 13.5 % (ref 11.5–15.5)
WBC Count: 6.4 10*3/uL (ref 4.0–10.5)
nRBC: 0 % (ref 0.0–0.2)

## 2023-08-11 LAB — CEA (ACCESS): CEA (CHCC): 1.31 ng/mL (ref 0.00–5.00)

## 2023-08-11 MED ORDER — LIDOCAINE-PRILOCAINE 2.5-2.5 % EX CREA: 1.0000 | TOPICAL_CREAM | CUTANEOUS | 0 refills | Status: DC | PRN

## 2023-08-11 MED ORDER — SUCRALFATE 1 G PO TABS: 1.0000 g | ORAL_TABLET | Freq: Three times a day (TID) | ORAL | 2 refills | Status: DC

## 2023-08-11 MED ORDER — HYDROMORPHONE HCL 4 MG PO TABS
4.0000 mg | ORAL_TABLET | ORAL | 0 refills | Status: DC | PRN
Start: 2023-08-11 — End: 2023-08-16

## 2023-08-11 MED ORDER — PROCHLORPERAZINE MALEATE 10 MG PO TABS
10.0000 mg | ORAL_TABLET | Freq: Four times a day (QID) | ORAL | 0 refills | Status: DC | PRN
Start: 2023-08-11 — End: 2023-09-18

## 2023-08-11 MED ORDER — ONDANSETRON HCL 8 MG PO TABS: 8.0000 mg | ORAL_TABLET | Freq: Three times a day (TID) | ORAL | 0 refills | Status: DC | PRN

## 2023-08-11 NOTE — Patient Outreach (Signed)
 Care Management  Transitions of Care Program Transitions of Care Post-discharge week 4   08/11/2023 Name: [name] MRN: [id number] DOB: [birth year]  Subjective: Anthony JAGGERS is a 63 y.o. year old male who is a primary care patient of [name], MD. The Care Management team Engaged with patient Engaged with patient by telephone to assess and address transitions of care needs.   Consent to Services:  Patient was given information about care management services, agreed to services, and gave verbal consent to participate.   Assessment:   Patient reports he is high spirits and doing well.  He has had is port a cath placed and completed his chemotherapy class.  [name] is feeling positive and upbeat about his upcoming treatments and achieving great outcomes.  We reviewed his upcoming treatments, his treatment schedule and follow-up appointments.  [name] asked appropriate questions, we reviewed and reconciled his medication profile and expressed great understanding of it all.  He has a very supportive spouse, mother and friends/family who have rallied around him. No acute changes or unmet needs.  Will continue to follow and assess as he progresses into his treatments over the next week .      SDOH Interventions    Flowsheet Row Office Visit from 07/25/2023 in [facility] [place] Telephone from 07/21/2023 in [place] POPULATION HEALTH DEPARTMENT  SDOH Interventions    Food Insecurity Interventions Other (Comment)  [Grocery bag from food pantry and CSW referral] Intervention Not Indicated  Housing Interventions Intervention Not Indicated Intervention Not Indicated  Transportation Interventions Intervention Not Indicated --  Utilities Interventions Other (Comment)  [CSW referral] Intervention Not Indicated  Depression Interventions/Treatment  Currently on Treatment  [CSW referral] --        Goals Addressed              This Visit's Progress    Transition of Care Goals       Current Barriers:  Knowledge Deficits related to plan of care for treatments and self-care management r/t recent Stomach Cancer diagnosis Medication management - pain management Diet/Nutrition/Food Resources Provider appointments - PCP, Specialist  RNCM Clinical Goal(s):  Patient will verbalize basic understanding of  cancer diagnosis and self health management plan as evidenced by asking appropriate care questions and verbalize understanding of when to notify doctor for questions, concerns or new problems per review of EMR.  Demonstrate understanding of new cancer treatments and self-care management to minimize side effects from chemotherapy treatments Attend all scheduled medical appointments:  All PCP and Specialist appointments kept or re-scheduled through collaboration with RN Care manager, provider, and care team  Interventions: Evaluation of current treatment plan related to  self management and patient's adherence to plan as established by provider. Keep a tablet for questions you would like to ask the doctor for your next appointment and, if able, take a support person with you to take notes while you talk with your doctor.  Patient is very agreeable.  Transitions of Care:  Goal on track:  Yes. Doctor Visits  - discussed the importance of keeping doctor visits and interdisciplinary team appointments (I.e. genetics, Nutritionist, Cancer Navigator, SW) Patient has a very supportive spouse, mother, children and friends who attend/will attend future appointments and treatments.   Assessed financial resources or any financial barriers to adherence to treatment plan:  Patient reports recently approved for Disability Medicare and has Medicaid.  Has transportation to appointments and treatments  and denies any unmet needs at this time Answered patient's questions regarding GERD medication, pain medications and Miralax .  Verbalized  understanding.  Oncology:  (Status: Goal on track:  Yes.) Long Term Goal Assessed patient understanding of upcoming cancer treatments, provider appointments and care of port-A-Cath. Patient and spouse completed chemotherapy class and ask RNCM appropriate questions about upcoming treatments.  Expressed good understanding of upcoming treatments, and appointments.  Next Oncology appointment 08/16/2023 for first chemotherapy.  Patient will received chemotherapy and immunotherapy together.  Pain Interventions:  (Status:  Goal on track:  Yes.) Long Term Goal Pain assessment performed - Reports no pain today.  Reports excellent pain management with current medications. Educated on the importance of reporting new pain symptoms or if  pain symptoms are no longer managed by current pain medication regime.   Patient Goals/Self-Care Activities: Participate in Transition of Care Program/Attend [facility] scheduled calls Notify RN Care Manager of TOC call rescheduling needs Take all medications as prescribed Attend all scheduled provider appointments Call provider office for new concerns, or changes in your condition or questions regarding medications  Follow Up Plan:  Telephone follow up appointment with care management team member scheduled for:  08/17/2023 at 3:15 PM - 3:30PM The patient has been provided with contact information for the care management team and has been advised to call with any health related questions or concerns.     [name] BSN, RN RN Care Manager   Transitions of Care [facility] [place] Direct Dial Number:  [phone]            Plan: Telephone follow up appointment with care management team member scheduled for: 08/17/23 between 3:15 - 3:30 PM The patient has been provided with contact information for the care management team and has been advised to call with any health related questions or concerns.   [name] Lunger BSN, Programmer, Systems /  Transitions of Care [place] [facility] Direct Dial Number:  [phone]

## 2023-08-11 NOTE — Progress Notes (Signed)
 Per MD order: added CMP for 08/16/23. Scheduling message sent

## 2023-08-12 ENCOUNTER — Other Ambulatory Visit: Payer: Self-pay | Admitting: Oncology

## 2023-08-13 ENCOUNTER — Other Ambulatory Visit: Payer: Self-pay

## 2023-08-14 ENCOUNTER — Inpatient Hospital Stay: Payer: 59

## 2023-08-14 ENCOUNTER — Inpatient Hospital Stay: Payer: Medicaid Other | Admitting: Nurse Practitioner

## 2023-08-15 ENCOUNTER — Encounter (HOSPITAL_COMMUNITY): Payer: Self-pay

## 2023-08-16 ENCOUNTER — Inpatient Hospital Stay: Payer: 59

## 2023-08-16 ENCOUNTER — Inpatient Hospital Stay: Payer: Medicaid Other

## 2023-08-16 ENCOUNTER — Encounter: Payer: Self-pay | Admitting: Nurse Practitioner

## 2023-08-16 ENCOUNTER — Inpatient Hospital Stay: Payer: Medicaid Other | Admitting: Nutrition

## 2023-08-16 ENCOUNTER — Inpatient Hospital Stay (HOSPITAL_BASED_OUTPATIENT_CLINIC_OR_DEPARTMENT_OTHER): Payer: 59 | Admitting: Nurse Practitioner

## 2023-08-16 VITALS — BP 108/75 | HR 100 | Temp 98.2°F | Resp 18 | Ht 73.0 in | Wt 165.4 lb

## 2023-08-16 VITALS — BP 131/94 | HR 93 | Resp 18

## 2023-08-16 DIAGNOSIS — C163 Malignant neoplasm of pyloric antrum: Secondary | ICD-10-CM | POA: Diagnosis not present

## 2023-08-16 DIAGNOSIS — Z5111 Encounter for antineoplastic chemotherapy: Secondary | ICD-10-CM | POA: Diagnosis not present

## 2023-08-16 DIAGNOSIS — C169 Malignant neoplasm of stomach, unspecified: Secondary | ICD-10-CM | POA: Diagnosis not present

## 2023-08-16 LAB — CMP (CANCER CENTER ONLY)
ALT: 7 U/L (ref 0–44)
AST: 12 U/L — ABNORMAL LOW (ref 15–41)
Albumin: 3.8 g/dL (ref 3.5–5.0)
Alkaline Phosphatase: 63 U/L (ref 38–126)
Anion gap: 9 (ref 5–15)
BUN: 13 mg/dL (ref 6–20)
CO2: 34 mmol/L — ABNORMAL HIGH (ref 22–32)
Calcium: 9.4 mg/dL (ref 8.9–10.3)
Chloride: 89 mmol/L — ABNORMAL LOW (ref 98–111)
Creatinine: 0.88 mg/dL (ref 0.61–1.24)
GFR, Estimated: >60 mL/min (ref >60)
Glucose, Bld: 110 mg/dL — ABNORMAL HIGH (ref 70–99)
Potassium: 2.9 mmol/L — ABNORMAL LOW (ref 3.5–5.1)
Sodium: 132 mmol/L — ABNORMAL LOW (ref 135–145)
Total Bilirubin: 0.5 mg/dL (ref 0.0–1.2)
Total Protein: 6.7 g/dL (ref 6.5–8.1)

## 2023-08-16 MED ORDER — DEXAMETHASONE SODIUM PHOSPHATE 10 MG/ML IJ SOLN
10.0000 mg | Freq: Once | INTRAMUSCULAR | Status: AC
  Administered 2023-08-16: 10 mg via INTRAVENOUS
  Filled 2023-08-16: qty 1

## 2023-08-16 MED ORDER — LEUCOVORIN CALCIUM INJECTION 350 MG
400.0000 mg/m2 | Freq: Once | INTRAVENOUS | Status: AC
  Administered 2023-08-16: 788 mg via INTRAVENOUS
  Filled 2023-08-16: qty 39.4

## 2023-08-16 MED ORDER — FLUOROURACIL CHEMO INJECTION 2.5 GM/50ML
400.0000 mg/m2 | Freq: Once | INTRAVENOUS | Status: AC
Start: 2023-08-16 — End: 2023-08-16
  Administered 2023-08-16: 800 mg via INTRAVENOUS
  Filled 2023-08-16: qty 16

## 2023-08-16 MED ORDER — HYDROMORPHONE HCL 4 MG PO TABS: 4.0000 mg | ORAL_TABLET | ORAL | 0 refills | Status: DC | PRN

## 2023-08-16 MED ORDER — PALONOSETRON HCL INJECTION 0.25 MG/5ML
0.2500 mg | Freq: Once | INTRAVENOUS | Status: AC
  Administered 2023-08-16: 0.25 mg via INTRAVENOUS
  Filled 2023-08-16: qty 5

## 2023-08-16 MED ORDER — NIVOLUMAB CHEMO INJECTION 100 MG/10ML
240.0000 mg | Freq: Once | INTRAVENOUS | Status: AC
  Administered 2023-08-16: 240 mg via INTRAVENOUS
  Filled 2023-08-16: qty 24

## 2023-08-16 MED ORDER — POTASSIUM CHLORIDE CRYS ER 10 MEQ PO TBCR: 10.0000 meq | EXTENDED_RELEASE_TABLET | Freq: Two times a day (BID) | ORAL | 3 refills | Status: DC

## 2023-08-16 MED ORDER — OXALIPLATIN CHEMO INJECTION 100 MG/20ML
85.0000 mg/m2 | Freq: Once | INTRAVENOUS | Status: AC
  Administered 2023-08-16: 165 mg via INTRAVENOUS
  Filled 2023-08-16: qty 33

## 2023-08-16 MED ORDER — DEXTROSE 5 % IV SOLN
INTRAVENOUS | Status: DC
Start: 2023-08-16 — End: 2023-08-16

## 2023-08-16 MED ORDER — FLUOROURACIL CHEMO INJECTION 5 GM/100ML
2400.0000 mg/m2 | INTRAVENOUS | Status: DC
  Administered 2023-08-16: 5000 mg via INTRAVENOUS
  Filled 2023-08-16: qty 100

## 2023-08-16 NOTE — Progress Notes (Signed)
 63 yo male diagnosed with Gastric cancer and followed by Dr. Cloretta. Receiving Opdivo /FOLFOX. Today is his first treatment.  PMH includes GERD, DVT, HTN, PAD, polysubstance abuse  Medications include Folvite , Protonix , Nicoderm, Miralax , Thiamine , Carafate , Zofran , Protonix , Potassium, Compazine   Labs include Na 132, K 2.9, Glucose 110.  Height: 6 1. Weight: 165 pounds 6.4 oz. UBW: 180 pounds BMI: 21.82.  Patient reports poor appetite and decreased oral intake. He has lost 15 pounds in less than 6 months. (8% loss) He tries to eat but only can eat a few bites and then his stomach begins to gurgle and make noise and he feels the need to stop eating. Denies lactose intolerance. He has tried various ONS and currently drinks CIB. He has received a lot of supplements from friends and has some questions. States MD told him not to take the sea moss supplement. Has requested something to help his appetite. Patient wants to take CBD gummies for appetite. Tolerates boiled eggs, oatmeal, fruit, cheerios with milk. Does not tolerate meats.  Nutrition Diagnosis: Unintended wt loss related to cancer and associated treatments as evidenced by 8% wt loss in less than 6 months.  Intervention: Educated on strategies to improve appetite. Provided nutrition fact sheet. MD does not want to order appetite stimulant at this time. Discouraged CBD. Encouraged small, frequent meals/snacks throughout the day. Choose high calorie, high protein foods and supplements. Provided recipe packet. Continue soft foods as needed for easier swallowing. Bowel regimen. Encouraged weight maintenance. Questions answered. Multiple fact sheets provided. Name and contact information provided.  Monitoring, Evaluation, Goals: Tolerate increase calories and protein to minimize wt loss.  Next Visit: Wednesday, November 22 during infusion

## 2023-08-16 NOTE — Patient Instructions (Addendum)
 [facility]  Discharge Instructions: Thank you for choosing [facility] to provide your oncology and hematology care.   If you have a lab appointment with the Cancer Center, please go directly to the Cancer Center and check in at the registration area.   Wear comfortable clothing and clothing appropriate for easy access to any Portacath or PICC line.   We strive to give you quality time with your provider. You may need to reschedule your appointment if you arrive late (15 or more minutes).  Arriving late affects you and other patients whose appointments are after yours.  Also, if you miss three or more appointments without notifying the office, you may be dismissed from the clinic at the provider's discretion.      For prescription refill requests, have your pharmacy contact our office and allow 72 hours for refills to be completed.    Today you received the following chemotherapy and/or immunotherapy agents Nivolumab/Opdivo, Oxaliplatin, Leucovorin and Fluorouracil      To help prevent nausea and vomiting after your treatment, we encourage you to take your nausea medication as directed.  BELOW ARE SYMPTOMS THAT SHOULD BE REPORTED IMMEDIATELY: *FEVER GREATER THAN 100.4 F (38 C) OR HIGHER *CHILLS OR SWEATING *NAUSEA AND VOMITING THAT IS NOT CONTROLLED WITH YOUR NAUSEA MEDICATION *UNUSUAL SHORTNESS OF BREATH *UNUSUAL BRUISING OR BLEEDING *URINARY PROBLEMS (pain or burning when urinating, or frequent urination) *BOWEL PROBLEMS (unusual diarrhea, constipation, pain near the anus) TENDERNESS IN MOUTH AND THROAT WITH OR WITHOUT PRESENCE OF ULCERS (sore throat, sores in mouth, or a toothache) UNUSUAL RASH, SWELLING OR PAIN  UNUSUAL VAGINAL DISCHARGE OR ITCHING   Items with * indicate a potential emergency and should be followed up as soon as possible or go to the Emergency Department if any problems should occur.  Please  show the CHEMOTHERAPY ALERT CARD or IMMUNOTHERAPY ALERT CARD at check-in to the Emergency Department and triage nurse.  Should you have questions after your visit or need to cancel or reschedule your appointment, please contact [facility]  Dept: [phone]  and follow the prompts.  Office hours are 8:00 a.m. to 4:30 p.m. Monday - Friday. Please note that voicemails left after 4:00 p.m. may not be returned until the following business day.  We are closed weekends and major holidays. You have access to a nurse at all times for urgent questions. Please call the main number to the clinic Dept: [phone] and follow the prompts.   For any non-urgent questions, you may also contact your provider using MyChart. We now offer e-Visits for anyone 41 and older to request care online for non-urgent symptoms. For details visit [email or web address].   Also download the MyChart app! Go to the app store, search "MyChart", open the app, select [place], and log in with your MyChart username and password.  Nivolumab Injection What is this medication? NIVOLUMAB (nye VOL ue mab) treats some types of cancer. It works by helping your immune system slow or stop the spread of cancer cells. It is a monoclonal antibody. This medicine may be used for other purposes; ask your health care provider or pharmacist if you have questions. COMMON BRAND NAME(S): Opdivo What should I tell my care team before I take this medication? They need to know if you have any of these conditions: Allogeneic stem cell transplant (uses someone else's stem cells) Autoimmune  diseases, such as Crohn disease, ulcerative colitis, lupus History of chest radiation Nervous system problems, such as Guillain-Barre syndrome or myasthenia gravis Organ transplant An unusual or allergic reaction to nivolumab, other medications, foods, dyes, or preservatives Pregnant or trying to get  pregnant Breast-feeding How should I use this medication? This medication is infused into a vein. It is given in a hospital or clinic setting. A special MedGuide will be given to you before each treatment. Be sure to read this information carefully each time. Talk to your care team about the use of this medication in children. While it may be prescribed for children as young as 12 years for selected conditions, precautions do apply. Overdosage: If you think you have taken too much of this medicine contact a poison control center or emergency room at once. NOTE: This medicine is only for you. Do not share this medicine with others. What if I miss a dose? Keep appointments for follow-up doses. It is important not to miss your dose. Call your care team if you are unable to keep an appointment. What may interact with this medication? Interactions have not been studied. This list may not describe all possible interactions. Give your health care provider a list of all the medicines, herbs, non-prescription drugs, or dietary supplements you use. Also tell them if you smoke, drink alcohol, or use illegal drugs. Some items may interact with your medicine. What should I watch for while using this medication? Your condition will be monitored carefully while you are receiving this medication. You may need blood work while taking this medication. This medication may cause serious skin reactions. They can happen weeks to months after starting the medication. Contact your care team right away if you notice fevers or flu-like symptoms with a rash. The rash may be red or purple and then turn into blisters or peeling of the skin. You may also notice a red rash with swelling of the face, lips, or lymph nodes in your neck or under your arms. Tell your care team right away if you have any change in your eyesight. Talk to your care team if you are pregnant or think you might be pregnant. A negative pregnancy test is  required before starting this medication. A reliable form of contraception is recommended while taking this medication and for 5 months after the last dose. Talk to your care team about effective forms of contraception. Do not breast-feed while taking this medication and for 5 months after the last dose. What side effects may I notice from receiving this medication? Side effects that you should report to your care team as soon as possible: Allergic reactions--skin rash, itching, hives, swelling of the face, lips, tongue, or throat Dry cough, shortness of breath or trouble breathing Eye pain, redness, irritation, or discharge with blurry or decreased vision Heart muscle inflammation--unusual weakness or fatigue, shortness of breath, chest pain, fast or irregular heartbeat, dizziness, swelling of the ankles, feet, or hands Hormone gland problems--headache, sensitivity to light, unusual weakness or fatigue, dizziness, fast or irregular heartbeat, increased sensitivity to cold or heat, excessive sweating, constipation, hair loss, increased thirst or amount of urine, tremors or shaking, irritability Infusion reactions--chest pain, shortness of breath or trouble breathing, feeling faint or lightheaded Kidney injury (glomerulonephritis)--decrease in the amount of urine, red or dark brown urine, foamy or bubbly urine, swelling of the ankles, hands, or feet Liver injury--right upper belly pain, loss of appetite, nausea, light-colored stool, dark yellow or brown urine, yellowing skin or  eyes, unusual weakness or fatigue Pain, tingling, or numbness in the hands or feet, muscle weakness, change in vision, confusion or trouble speaking, loss of balance or coordination, trouble walking, seizures Rash, fever, and swollen lymph nodes Redness, blistering, peeling, or loosening of the skin, including inside the mouth Sudden or severe stomach pain, bloody diarrhea, fever, nausea, vomiting Side effects that usually do  not require medical attention (report these to your care team if they continue or are bothersome): Bone, joint, or muscle pain Diarrhea Fatigue Loss of appetite Nausea Skin rash This list may not describe all possible side effects. Call your doctor for medical advice about side effects. You may report side effects to FDA at 1-800-FDA-1088. Where should I keep my medication? This medication is given in a hospital or clinic. It will not be stored at home. NOTE: This sheet is a summary. It may not cover all possible information. If you have questions about this medicine, talk to your doctor, pharmacist, or health care provider.  2024 Elsevier/Gold Standard (2021-11-22 00:00:00)  Oxaliplatin Injection What is this medication? OXALIPLATIN (ox AL i PLA tin) treats colorectal cancer. It works by slowing down the growth of cancer cells. This medicine may be used for other purposes; ask your health care provider or pharmacist if you have questions. COMMON BRAND NAME(S): Eloxatin What should I tell my care team before I take this medication? They need to know if you have any of these conditions: Heart disease History of irregular heartbeat or rhythm Liver disease Low blood cell levels (white cells, red cells, and platelets) Lung or breathing disease, such as asthma Take medications that treat or prevent blood clots Tingling of the fingers, toes, or other nerve disorder An unusual or allergic reaction to oxaliplatin, other medications, foods, dyes, or preservatives If you or your partner are pregnant or trying to get pregnant Breast-feeding How should I use this medication? This medication is injected into a vein. It is given by your care team in a hospital or clinic setting. Talk to your care team about the use of this medication in children. Special care may be needed. Overdosage: If you think you have taken too much of this medicine contact a poison control center or emergency room at  once. NOTE: This medicine is only for you. Do not share this medicine with others. What if I miss a dose? Keep appointments for follow-up doses. It is important not to miss a dose. Call your care team if you are unable to keep an appointment. What may interact with this medication? Do not take this medication with any of the following: Cisapride Dronedarone Pimozide Thioridazine This medication may also interact with the following: Aspirin and aspirin-like medications Certain medications that treat or prevent blood clots, such as warfarin, apixaban, dabigatran, and rivaroxaban Cisplatin Cyclosporine Diuretics Medications for infection, such as acyclovir, adefovir, amphotericin B, bacitracin, cidofovir, foscarnet, ganciclovir, gentamicin, pentamidine, vancomycin NSAIDs, medications for pain and inflammation, such as ibuprofen or naproxen Other medications that cause heart rhythm changes Pamidronate Zoledronic acid This list may not describe all possible interactions. Give your health care provider a list of all the medicines, herbs, non-prescription drugs, or dietary supplements you use. Also tell them if you smoke, drink alcohol, or use illegal drugs. Some items may interact with your medicine. What should I watch for while using this medication? Your condition will be monitored carefully while you are receiving this medication. You may need blood work while taking this medication. This medication may make you  feel generally unwell. This is not uncommon as chemotherapy can affect healthy cells as well as cancer cells. Report any side effects. Continue your course of treatment even though you feel ill unless your care team tells you to stop. This medication may increase your risk of getting an infection. Call your care team for advice if you get a fever, chills, sore throat, or other symptoms of a cold or flu. Do not treat yourself. Try to avoid being around people who are sick. Avoid  taking medications that contain aspirin, acetaminophen, ibuprofen, naproxen, or ketoprofen unless instructed by your care team. These medications may hide a fever. Be careful brushing or flossing your teeth or using a toothpick because you may get an infection or bleed more easily. If you have any dental work done, tell your dentist you are receiving this medication. This medication can make you more sensitive to cold. Do not drink cold drinks or use ice. Cover exposed skin before coming in contact with cold temperatures or cold objects. When out in cold weather wear warm clothing and cover your mouth and nose to warm the air that goes into your lungs. Tell your care team if you get sensitive to the cold. Talk to your care team if you or your partner are pregnant or think either of you might be pregnant. This medication can cause serious birth defects if taken during pregnancy and for 9 months after the last dose. A negative pregnancy test is required before starting this medication. A reliable form of contraception is recommended while taking this medication and for 9 months after the last dose. Talk to your care team about effective forms of contraception. Do not father a child while taking this medication and for 6 months after the last dose. Use a condom while having sex during this time period. Do not breastfeed while taking this medication and for 3 months after the last dose. This medication may cause infertility. Talk to your care team if you are concerned about your fertility. What side effects may I notice from receiving this medication? Side effects that you should report to your care team as soon as possible: Allergic reactions--skin rash, itching, hives, swelling of the face, lips, tongue, or throat Bleeding--bloody or black, tar-like stools, vomiting blood or brown material that looks like coffee grounds, red or dark brown urine, small red or purple spots on skin, unusual bruising or  bleeding Dry cough, shortness of breath or trouble breathing Heart rhythm changes--fast or irregular heartbeat, dizziness, feeling faint or lightheaded, chest pain, trouble breathing Infection--fever, chills, cough, sore throat, wounds that don't heal, pain or trouble when passing urine, general feeling of discomfort or being unwell Liver injury--right upper belly pain, loss of appetite, nausea, light-colored stool, dark yellow or brown urine, yellowing skin or eyes, unusual weakness or fatigue Low red blood cell level--unusual weakness or fatigue, dizziness, headache, trouble breathing Muscle injury--unusual weakness or fatigue, muscle pain, dark yellow or brown urine, decrease in amount of urine Pain, tingling, or numbness in the hands or feet Sudden and severe headache, confusion, change in vision, seizures, which may be signs of posterior reversible encephalopathy syndrome (PRES) Unusual bruising or bleeding Side effects that usually do not require medical attention (report to your care team if they continue or are bothersome): Diarrhea Nausea Pain, redness, or swelling with sores inside the mouth or throat Unusual weakness or fatigue Vomiting This list may not describe all possible side effects. Call your doctor for medical advice about side  effects. You may report side effects to FDA at 1-800-FDA-1088. Where should I keep my medication? This medication is given in a hospital or clinic. It will not be stored at home. NOTE: This sheet is a summary. It may not cover all possible information. If you have questions about this medicine, talk to your doctor, pharmacist, or health care provider.  2024 Elsevier/Gold Standard (2022-05-10 00:00:00)   Leucovorin Injection What is this medication? LEUCOVORIN (loo koe VOR in) prevents side effects from certain medications, such as methotrexate. It works by increasing folate levels. This helps protect healthy cells in your body. It may also be used  to treat anemia caused by low levels of folate. It can also be used with fluorouracil, a type of chemotherapy, to treat colorectal cancer. It works by increasing the effects of fluorouracil in the body. This medicine may be used for other purposes; ask your health care provider or pharmacist if you have questions. What should I tell my care team before I take this medication? They need to know if you have any of these conditions: Anemia from low levels of vitamin B12 in the blood An unusual or allergic reaction to leucovorin, folic acid, other medications, foods, dyes, or preservatives Pregnant or trying to get pregnant Breastfeeding How should I use this medication? This medication is injected into a vein or a muscle. It is given by your care team in a hospital or clinic setting. Talk to your care team about the use of this medication in children. Special care may be needed. Overdosage: If you think you have taken too much of this medicine contact a poison control center or emergency room at once. NOTE: This medicine is only for you. Do not share this medicine with others. What if I miss a dose? Keep appointments for follow-up doses. It is important not to miss your dose. Call your care team if you are unable to keep an appointment. What may interact with this medication? Capecitabine Fluorouracil Phenobarbital Phenytoin Primidone Trimethoprim;sulfamethoxazole This list may not describe all possible interactions. Give your health care provider a list of all the medicines, herbs, non-prescription drugs, or dietary supplements you use. Also tell them if you smoke, drink alcohol, or use illegal drugs. Some items may interact with your medicine. What should I watch for while using this medication? Your condition will be monitored carefully while you are receiving this medication. This medication may increase the side effects of 5-fluorouracil. Tell your care team if you have diarrhea or mouth  sores that do not get better or that get worse. What side effects may I notice from receiving this medication? Side effects that you should report to your care team as soon as possible: Allergic reactions--skin rash, itching, hives, swelling of the face, lips, tongue, or throat This list may not describe all possible side effects. Call your doctor for medical advice about side effects. You may report side effects to FDA at 1-800-FDA-1088. Where should I keep my medication? This medication is given in a hospital or clinic. It will not be stored at home. NOTE: This sheet is a summary. It may not cover all possible information. If you have questions about this medicine, talk to your doctor, pharmacist, or health care provider.  2024 Elsevier/Gold Standard (2021-12-28 00:00:00)   Fluorouracil Injection What is this medication? FLUOROURACIL (flure oh YOOR a sil) treats some types of cancer. It works by slowing down the growth of cancer cells. This medicine may be used for other purposes; ask  your health care provider or pharmacist if you have questions. COMMON BRAND NAME(S): Adrucil What should I tell my care team before I take this medication? They need to know if you have any of these conditions: Blood disorders Dihydropyrimidine dehydrogenase (DPD) deficiency Infection, such as chickenpox, cold sores, herpes Kidney disease Liver disease Poor nutrition Recent or ongoing radiation therapy An unusual or allergic reaction to fluorouracil, other medications, foods, dyes, or preservatives If you or your partner are pregnant or trying to get pregnant Breast-feeding How should I use this medication? This medication is injected into a vein. It is administered by your care team in a hospital or clinic setting. Talk to your care team about the use of this medication in children. Special care may be needed. Overdosage: If you think you have taken too much of this medicine contact a poison control  center or emergency room at once. NOTE: This medicine is only for you. Do not share this medicine with others. What if I miss a dose? Keep appointments for follow-up doses. It is important not to miss your dose. Call your care team if you are unable to keep an appointment. What may interact with this medication? Do not take this medication with any of the following: Live virus vaccines This medication may also interact with the following: Medications that treat or prevent blood clots, such as warfarin, enoxaparin, dalteparin This list may not describe all possible interactions. Give your health care provider a list of all the medicines, herbs, non-prescription drugs, or dietary supplements you use. Also tell them if you smoke, drink alcohol, or use illegal drugs. Some items may interact with your medicine. What should I watch for while using this medication? Your condition will be monitored carefully while you are receiving this medication. This medication may make you feel generally unwell. This is not uncommon as chemotherapy can affect healthy cells as well as cancer cells. Report any side effects. Continue your course of treatment even though you feel ill unless your care team tells you to stop. In some cases, you may be given additional medications to help with side effects. Follow all directions for their use. This medication may increase your risk of getting an infection. Call your care team for advice if you get a fever, chills, sore throat, or other symptoms of a cold or flu. Do not treat yourself. Try to avoid being around people who are sick. This medication may increase your risk to bruise or bleed. Call your care team if you notice any unusual bleeding. Be careful brushing or flossing your teeth or using a toothpick because you may get an infection or bleed more easily. If you have any dental work done, tell your dentist you are receiving this medication. Avoid taking medications that  contain aspirin, acetaminophen, ibuprofen, naproxen, or ketoprofen unless instructed by your care team. These medications may hide a fever. Do not treat diarrhea with over the counter products. Contact your care team if you have diarrhea that lasts more than 2 days or if it is severe and watery. This medication can make you more sensitive to the sun. Keep out of the sun. If you cannot avoid being in the sun, wear protective clothing and sunscreen. Do not use sun lamps, tanning beds, or tanning booths. Talk to your care team if you or your partner wish to become pregnant or think you might be pregnant. This medication can cause serious birth defects if taken during pregnancy and for 3 months after the  last dose. A reliable form of contraception is recommended while taking this medication and for 3 months after the last dose. Talk to your care team about effective forms of contraception. Do not father a child while taking this medication and for 3 months after the last dose. Use a condom while having sex during this time period. Do not breastfeed while taking this medication. This medication may cause infertility. Talk to your care team if you are concerned about your fertility. What side effects may I notice from receiving this medication? Side effects that you should report to your care team as soon as possible: Allergic reactions--skin rash, itching, hives, swelling of the face, lips, tongue, or throat Heart attack--pain or tightness in the chest, shoulders, arms, or jaw, nausea, shortness of breath, cold or clammy skin, feeling faint or lightheaded Heart failure--shortness of breath, swelling of the ankles, feet, or hands, sudden weight gain, unusual weakness or fatigue Heart rhythm changes--fast or irregular heartbeat, dizziness, feeling faint or lightheaded, chest pain, trouble breathing High ammonia level--unusual weakness or fatigue, confusion, loss of appetite, nausea, vomiting,  seizures Infection--fever, chills, cough, sore throat, wounds that don't heal, pain or trouble when passing urine, general feeling of discomfort or being unwell Low red blood cell level--unusual weakness or fatigue, dizziness, headache, trouble breathing Pain, tingling, or numbness in the hands or feet, muscle weakness, change in vision, confusion or trouble speaking, loss of balance or coordination, trouble walking, seizures Redness, swelling, and blistering of the skin over hands and feet Severe or prolonged diarrhea Unusual bruising or bleeding Side effects that usually do not require medical attention (report to your care team if they continue or are bothersome): Dry skin Headache Increased tears Nausea Pain, redness, or swelling with sores inside the mouth or throat Sensitivity to light Vomiting This list may not describe all possible side effects. Call your doctor for medical advice about side effects. You may report side effects to FDA at 1-800-FDA-1088. Where should I keep my medication? This medication is given in a hospital or clinic. It will not be stored at home. NOTE: This sheet is a summary. It may not cover all possible information. If you have questions about this medicine, talk to your doctor, pharmacist, or health care provider.  2024 Elsevier/Gold Standard (2021-11-30 00:00:00)

## 2023-08-16 NOTE — Progress Notes (Signed)
 Patient seen by Olam Ned NP today  Vitals are within treatment parameters:Yes   Labs are within treatment parameters: No (Please specify and give further instructions.) K+ 2.9 CBC on the 08/11/2023. Patient will start taking potassium chloride  10 MeQ  Treatment plan has been signed: Yes   Per physician team, Patient is ready for treatment and there are NO modifications to the treatment plan.

## 2023-08-16 NOTE — Progress Notes (Signed)
  [facility] OFFICE PROGRESS NOTE   Diagnosis: Gastric cancer  INTERVAL HISTORY:   [name] returns as scheduled.  Overall pain is controlled with a combination of MS Contin  and Dilaudid  1 to 3 tablets/day.  Appetite remains poor.  Main complaint is reflux.  No diarrhea.  Objective:  Vital signs in last 24 hours:  Blood pressure 108/75, pulse 100, temperature 98.2 F (36.8 C), temperature source Temporal, resp. rate 18, height 6' 1 (1.854 m), weight 165 lb 6.4 oz (75 kg), SpO2 98%.    HEENT: No thrush or ulcers. Resp: Lungs clear bilaterally. Cardio: Regular rate and rhythm. GI: Abdomen is distended, generalized firmness. Vascular: No leg edema. Port-A-Cath without erythema.  Lab Results:  Lab Results  Component Value Date   WBC 6.4 08/11/2023   HGB 15.3 08/11/2023   HCT 44.9 08/11/2023   MCV 93.3 08/11/2023   PLT 360 08/11/2023   NEUTROABS 4.5 08/11/2023    Imaging:  No results found.  Medications: I have reviewed the patient's current medications.  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  History of recurrent venous thrombosis-maintained on apixaban  anticoagulation 4.  Tobacco and alcohol use 5.  Family history of multiple cancers 6.  Anorexia/weight loss and constipation secondary to #1 7.  Left hip replacement June 2024  Disposition: [name] appears stable.  He is scheduled to begin treatment today with FOLFOX/nivolumab .  We again reviewed potential toxicities.  He agrees to proceed.  Chemistry panel reviewed, adequate for treatment.  Calcium  level is normal.  He has hypokalemia and will begin a potassium supplement.  Dilaudid  refill sent to his pharmacy.  He will return for follow-up and treatment in 2 weeks.  We are available to see him sooner if needed.  Patient seen with Dr. Cloretta.    [name] ANP/GNP-BC   08/16/2023  8:22 AM  This was a shared visit with [name].  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  The plan is to begin FOLFOX/nivolumab  today.  We reviewed potential toxicities associated with this regimen.  He agrees to proceed.  We are contacting pathology to clarify the discordance between the Foundation 1 and [facility] PD-L1 testing.  I was present for greater than 50% of today's visit.  I performed Medical Decision Making.  Anthony Cloretta, MD

## 2023-08-17 ENCOUNTER — Encounter: Payer: Self-pay | Admitting: Oncology

## 2023-08-17 ENCOUNTER — Other Ambulatory Visit: Payer: 59

## 2023-08-17 ENCOUNTER — Encounter: Payer: 59 | Admitting: Physical Therapy

## 2023-08-17 ENCOUNTER — Telehealth: Payer: Self-pay

## 2023-08-17 ENCOUNTER — Telehealth: Payer: Self-pay | Admitting: *Deleted

## 2023-08-17 ENCOUNTER — Other Ambulatory Visit: Payer: Self-pay

## 2023-08-17 NOTE — Telephone Encounter (Signed)
 24 HOUR CALL BACK  Telephone call to patient post his first time Opdivo /Folfox infusion. Patient reports having some mild cold sensitivity. Patient was reeducated on the importance of avoiding eating or drinking anything cold and ensuring he has gloves on when touching cold surfaces. He was also encouraged to have his nose, mouth and neck covered when going out in the cold weather. Patient verbalized understanding. He knows to call the clinic with any concerns and will be back tomorrow 08/18/23 for his pump stop appointment.

## 2023-08-17 NOTE — Telephone Encounter (Signed)
 Notified Jasmine via VM that we have not received any FMLA paper work from her employer to complete. Suggested she obtain form from HR and bring to his pump d/c appointment tomorrow with her portion completed and she will need to have him sign an ROI and cover sheet as well.

## 2023-08-17 NOTE — Patient Outreach (Signed)
  Care Management  Transitions of Care Program Transitions of Care Post-discharge week 4  08/17/2023 Name: [name] MRN: [id number] DOB: [birth year]  Subjective: [name] is a 63 y.o. year old male who is a primary care patient of [name], MD. The Care Management team was unable to reach the patient or spouse by phone to assess and address transitions of care needs.  Patient provided permission to speak with his spouse, [name], this visit as he anticipated he may be fatigued from his appointments and treatments.  No answer at spouse phone number and unable to leave a message.    Plan: Additional outreach attempts will be made to reach the patient enrolled in the [facility] Program (Post Inpatient/ED Visit).  Ranyia Witting Gladis BSN, Programmer, Systems / Transitions of Care [place] [facility] Direct Dial Number:  [phone]

## 2023-08-18 ENCOUNTER — Inpatient Hospital Stay: Payer: 59

## 2023-08-18 VITALS — BP 119/97 | HR 96 | Temp 98.2°F | Resp 18

## 2023-08-18 DIAGNOSIS — C163 Malignant neoplasm of pyloric antrum: Secondary | ICD-10-CM

## 2023-08-18 DIAGNOSIS — Z5111 Encounter for antineoplastic chemotherapy: Secondary | ICD-10-CM | POA: Diagnosis not present

## 2023-08-18 MED ORDER — SODIUM CHLORIDE 0.9% FLUSH
10.0000 mL | INTRAVENOUS | Status: DC | PRN
  Administered 2023-08-18: 10 mL

## 2023-08-18 MED ORDER — HEPARIN SOD (PORK) LOCK FLUSH 100 UNIT/ML IV SOLN
500.0000 [IU] | Freq: Once | INTRAVENOUS | Status: AC | PRN
Start: 2023-08-18 — End: 2023-08-18
  Administered 2023-08-18: 500 [IU]

## 2023-08-18 NOTE — Progress Notes (Signed)
 Patient in infusion room for pump d/c, stated he has had nausea off and on for the past 2 days, reporting that it was more noticeable after taking home dose MS Contin . Patient stated he threw up twice this morning,took home dose compazine  at 9:30am, and stated nausea has improved.   Encouraged patient to make sure to have food on his stomach before taking pain meds and to also take one of his nausea meds prior to taking his pain medications.  Also discussed a schedule for when patient can take home nausea medications. IV hydration offered to patient and both patient and his wife declined, stating they felt comfortable with pushing oral hydration at home.  Patient was given some gatorlyte to take home with him.  Patient was also abe to eat a half container of yogurt while in the infusion room without any issues.  Patient and his wife encouraged to contact office if they had any future questions or concerns.  Both verbalized understanding.

## 2023-08-21 ENCOUNTER — Telehealth: Payer: Self-pay

## 2023-08-21 NOTE — Telephone Encounter (Signed)
 Left voicemail for patient's daughter for call back regarding information needed to complete requested FMLA

## 2023-08-22 ENCOUNTER — Other Ambulatory Visit: Payer: Self-pay

## 2023-08-22 ENCOUNTER — Telehealth: Payer: Self-pay

## 2023-08-22 DIAGNOSIS — K219 Gastro-esophageal reflux disease without esophagitis: Secondary | ICD-10-CM

## 2023-08-22 MED ORDER — PANTOPRAZOLE SODIUM 40 MG PO TBEC: 40.0000 mg | DELAYED_RELEASE_TABLET | Freq: Two times a day (BID) | ORAL | 1 refills | Status: DC

## 2023-08-22 NOTE — Telephone Encounter (Signed)
 The patient contacted [place] to report a worsening of his heartburn and inquired about potential remedies. He is currently prescribed pantoprazole, which needs a refill. I have processed the refill request for pantoprazole as per Lisa's instructions.

## 2023-08-24 ENCOUNTER — Encounter: Payer: 59 | Admitting: Physical Therapy

## 2023-08-25 ENCOUNTER — Other Ambulatory Visit: Payer: Self-pay | Admitting: Nurse Practitioner

## 2023-08-25 ENCOUNTER — Telehealth: Payer: Self-pay

## 2023-08-25 DIAGNOSIS — C163 Malignant neoplasm of pyloric antrum: Secondary | ICD-10-CM

## 2023-08-25 MED ORDER — MORPHINE SULFATE ER 15 MG PO TBCR: 15.0000 mg | EXTENDED_RELEASE_TABLET | Freq: Two times a day (BID) | ORAL | 0 refills | Status: DC

## 2023-08-25 NOTE — Telephone Encounter (Signed)
Patient called and request a refill of his Morphine 15 mg., I placed the request on the Np's desk

## 2023-08-27 ENCOUNTER — Other Ambulatory Visit: Payer: Self-pay | Admitting: Oncology

## 2023-08-28 ENCOUNTER — Other Ambulatory Visit: Payer: 59

## 2023-08-28 ENCOUNTER — Ambulatory Visit: Payer: 59

## 2023-08-28 ENCOUNTER — Ambulatory Visit: Payer: 59 | Admitting: Oncology

## 2023-08-29 ENCOUNTER — Telehealth: Payer: Self-pay

## 2023-08-29 NOTE — Telephone Encounter (Signed)
Left voicemail for patient's daughter informing her that her [employer] FMLA documents had been completed and faxed back to company. Fax confirmation received. Copy of documents mailed to patient's address.

## 2023-08-30 ENCOUNTER — Inpatient Hospital Stay: Payer: 59

## 2023-08-30 ENCOUNTER — Inpatient Hospital Stay: Payer: 59 | Admitting: Nutrition

## 2023-08-30 ENCOUNTER — Inpatient Hospital Stay (HOSPITAL_BASED_OUTPATIENT_CLINIC_OR_DEPARTMENT_OTHER): Payer: 59 | Admitting: Oncology

## 2023-08-30 VITALS — BP 130/96 | HR 95

## 2023-08-30 VITALS — BP 124/87 | HR 116 | Temp 98.7°F | Resp 18 | Ht 73.0 in | Wt 159.0 lb

## 2023-08-30 DIAGNOSIS — C163 Malignant neoplasm of pyloric antrum: Secondary | ICD-10-CM | POA: Diagnosis not present

## 2023-08-30 DIAGNOSIS — C169 Malignant neoplasm of stomach, unspecified: Secondary | ICD-10-CM | POA: Diagnosis not present

## 2023-08-30 DIAGNOSIS — Z5111 Encounter for antineoplastic chemotherapy: Secondary | ICD-10-CM | POA: Diagnosis not present

## 2023-08-30 LAB — CMP (CANCER CENTER ONLY)
ALT: 9 U/L (ref 0–44)
AST: 13 U/L — ABNORMAL LOW (ref 15–41)
Albumin: 3.9 g/dL (ref 3.5–5.0)
Alkaline Phosphatase: 98 U/L (ref 38–126)
Anion gap: 12 (ref 5–15)
BUN: 11 mg/dL (ref 6–20)
CO2: 28 mmol/L (ref 22–32)
Calcium: 10.2 mg/dL (ref 8.9–10.3)
Chloride: 95 mmol/L — ABNORMAL LOW (ref 98–111)
Creatinine: 1.14 mg/dL (ref 0.61–1.24)
GFR, Estimated: >60 mL/min (ref >60)
Glucose, Bld: 118 mg/dL — ABNORMAL HIGH (ref 70–99)
Potassium: 3.4 mmol/L — ABNORMAL LOW (ref 3.5–5.1)
Sodium: 135 mmol/L (ref 135–145)
Total Bilirubin: 0.5 mg/dL (ref 0.0–1.2)
Total Protein: 6.8 g/dL (ref 6.5–8.1)

## 2023-08-30 LAB — CBC WITH DIFFERENTIAL (CANCER CENTER ONLY)
Abs Immature Granulocytes: 0.01 10*3/uL (ref 0.00–0.07)
Basophils Absolute: 0 10*3/uL (ref 0.0–0.1)
Basophils Relative: 0 %
Eosinophils Absolute: 0.1 10*3/uL (ref 0.0–0.5)
Eosinophils Relative: 1 %
HCT: 40.6 % (ref 39.0–52.0)
Hemoglobin: 13.9 g/dL (ref 13.0–17.0)
Immature Granulocytes: 0 %
Lymphocytes Relative: 27 %
Lymphs Abs: 1.3 10*3/uL (ref 0.7–4.0)
MCH: 31 pg (ref 26.0–34.0)
MCHC: 34.2 g/dL (ref 30.0–36.0)
MCV: 90.6 fL (ref 80.0–100.0)
Monocytes Absolute: 0.5 10*3/uL (ref 0.1–1.0)
Monocytes Relative: 10 %
Neutro Abs: 3 10*3/uL (ref 1.7–7.7)
Neutrophils Relative %: 62 %
Platelet Count: 319 10*3/uL (ref 150–400)
RBC: 4.48 MIL/uL (ref 4.22–5.81)
RDW: 13.1 % (ref 11.5–15.5)
WBC Count: 4.9 10*3/uL (ref 4.0–10.5)
nRBC: 0 % (ref 0.0–0.2)

## 2023-08-30 MED ORDER — HYDROMORPHONE HCL 4 MG PO TABS: 4.0000 mg | ORAL_TABLET | ORAL | 0 refills | Status: DC | PRN

## 2023-08-30 MED ORDER — DEXTROSE 5 % IV SOLN: INTRAVENOUS | Status: DC

## 2023-08-30 MED ORDER — PALONOSETRON HCL INJECTION 0.25 MG/5ML
0.2500 mg | Freq: Once | INTRAVENOUS | Status: AC
  Administered 2023-08-30: 0.25 mg via INTRAVENOUS
  Filled 2023-08-30: qty 5

## 2023-08-30 MED ORDER — FAMOTIDINE IN NACL 20-0.9 MG/50ML-% IV SOLN
20.0000 mg | Freq: Once | INTRAVENOUS | Status: DC | PRN
Start: 2023-08-30 — End: 2023-08-30

## 2023-08-30 MED ORDER — DEXTROSE 5 % IV SOLN
85.0000 mg/m2 | Freq: Once | INTRAVENOUS | Status: AC
  Administered 2023-08-30: 150 mg via INTRAVENOUS
  Filled 2023-08-30: qty 20

## 2023-08-30 MED ORDER — DEXAMETHASONE SODIUM PHOSPHATE 10 MG/ML IJ SOLN
10.0000 mg | Freq: Once | INTRAMUSCULAR | Status: AC
  Administered 2023-08-30: 10 mg via INTRAVENOUS
  Filled 2023-08-30: qty 1

## 2023-08-30 MED ORDER — NIVOLUMAB CHEMO INJECTION 100 MG/10ML
240.0000 mg | Freq: Once | INTRAVENOUS | Status: AC
  Administered 2023-08-30: 240 mg via INTRAVENOUS
  Filled 2023-08-30: qty 24

## 2023-08-30 MED ORDER — LEUCOVORIN CALCIUM INJECTION 350 MG
400.0000 mg/m2 | Freq: Once | INTRAVENOUS | Status: AC
  Administered 2023-08-30: 772 mg via INTRAVENOUS
  Filled 2023-08-30: qty 38.6

## 2023-08-30 MED ORDER — FLUOROURACIL CHEMO INJECTION 2.5 GM/50ML
400.0000 mg/m2 | Freq: Once | INTRAVENOUS | Status: AC
Start: 2023-08-30 — End: 2023-08-30
  Administered 2023-08-30: 750 mg via INTRAVENOUS
  Filled 2023-08-30: qty 15

## 2023-08-30 MED ORDER — SODIUM CHLORIDE 0.9 % IV SOLN: Freq: Once | INTRAVENOUS | Status: DC | PRN

## 2023-08-30 MED ORDER — FLUOROURACIL CHEMO INJECTION 5 GM/100ML
2400.0000 mg/m2 | INTRAVENOUS | Status: DC
  Administered 2023-08-30: 5000 mg via INTRAVENOUS
  Filled 2023-08-30: qty 100

## 2023-08-30 NOTE — Patient Instructions (Signed)
[facility] A DEPT OF [facility]  Discharge Instructions: Thank you for choosing [facility] to provide your oncology and hematology care.   If you have a lab appointment with the Cancer Center, please go directly to the Cancer Center and check in at the registration area.   Wear comfortable clothing and clothing appropriate for easy access to any Portacath or PICC line.   We strive to give you quality time with your provider. You may need to reschedule your appointment if you arrive late (15 or more minutes).  Arriving late affects you and other patients whose appointments are after yours.  Also, if you miss three or more appointments without notifying the office, you may be dismissed from the clinic at the provider's discretion.      For prescription refill requests, have your pharmacy contact our office and allow 72 hours for refills to be completed.    Today you received the following chemotherapy and/or immunotherapy agents Nivolumab/Opdivo, Oxaliplatin, Leucovorin and Fluorouracil      To help prevent nausea and vomiting after your treatment, we encourage you to take your nausea medication as directed.  BELOW ARE SYMPTOMS THAT SHOULD BE REPORTED IMMEDIATELY: *FEVER GREATER THAN 100.4 F (38 C) OR HIGHER *CHILLS OR SWEATING *NAUSEA AND VOMITING THAT IS NOT CONTROLLED WITH YOUR NAUSEA MEDICATION *UNUSUAL SHORTNESS OF BREATH *UNUSUAL BRUISING OR BLEEDING *URINARY PROBLEMS (pain or burning when urinating, or frequent urination) *BOWEL PROBLEMS (unusual diarrhea, constipation, pain near the anus) TENDERNESS IN MOUTH AND THROAT WITH OR WITHOUT PRESENCE OF ULCERS (sore throat, sores in mouth, or a toothache) UNUSUAL RASH, SWELLING OR PAIN  UNUSUAL VAGINAL DISCHARGE OR ITCHING   Items with * indicate a potential emergency and should be followed up as soon as possible or go to the Emergency Department if any problems should occur.  Please  show the CHEMOTHERAPY ALERT CARD or IMMUNOTHERAPY ALERT CARD at check-in to the Emergency Department and triage nurse.  Should you have questions after your visit or need to cancel or reschedule your appointment, please contact [facility]  Dept: [phone]  and follow the prompts.  Office hours are 8:00 a.m. to 4:30 p.m. Monday - Friday. Please note that voicemails left after 4:00 p.m. may not be returned until the following business day.  We are closed weekends and major holidays. You have access to a nurse at all times for urgent questions. Please call the main number to the clinic Dept: [phone] and follow the prompts.   For any non-urgent questions, you may also contact your provider using MyChart. We now offer e-Visits for anyone 41 and older to request care online for non-urgent symptoms. For details visit [email or web address].   Also download the MyChart app! Go to the app store, search "MyChart", open the app, select [place], and log in with your MyChart username and password.  Nivolumab Injection What is this medication? NIVOLUMAB (nye VOL ue mab) treats some types of cancer. It works by helping your immune system slow or stop the spread of cancer cells. It is a monoclonal antibody. This medicine may be used for other purposes; ask your health care provider or pharmacist if you have questions. COMMON BRAND NAME(S): Opdivo What should I tell my care team before I take this medication? They need to know if you have any of these conditions: Allogeneic stem cell transplant (uses someone else's stem cells) Autoimmune  diseases, such as Crohn disease, ulcerative colitis, lupus History of chest radiation Nervous system problems, such as Guillain-Barre syndrome or myasthenia gravis Organ transplant An unusual or allergic reaction to nivolumab, other medications, foods, dyes, or preservatives Pregnant or trying to get  pregnant Breast-feeding How should I use this medication? This medication is infused into a vein. It is given in a hospital or clinic setting. A special MedGuide will be given to you before each treatment. Be sure to read this information carefully each time. Talk to your care team about the use of this medication in children. While it may be prescribed for children as young as 12 years for selected conditions, precautions do apply. Overdosage: If you think you have taken too much of this medicine contact a poison control center or emergency room at once. NOTE: This medicine is only for you. Do not share this medicine with others. What if I miss a dose? Keep appointments for follow-up doses. It is important not to miss your dose. Call your care team if you are unable to keep an appointment. What may interact with this medication? Interactions have not been studied. This list may not describe all possible interactions. Give your health care provider a list of all the medicines, herbs, non-prescription drugs, or dietary supplements you use. Also tell them if you smoke, drink alcohol, or use illegal drugs. Some items may interact with your medicine. What should I watch for while using this medication? Your condition will be monitored carefully while you are receiving this medication. You may need blood work while taking this medication. This medication may cause serious skin reactions. They can happen weeks to months after starting the medication. Contact your care team right away if you notice fevers or flu-like symptoms with a rash. The rash may be red or purple and then turn into blisters or peeling of the skin. You may also notice a red rash with swelling of the face, lips, or lymph nodes in your neck or under your arms. Tell your care team right away if you have any change in your eyesight. Talk to your care team if you are pregnant or think you might be pregnant. A negative pregnancy test is  required before starting this medication. A reliable form of contraception is recommended while taking this medication and for 5 months after the last dose. Talk to your care team about effective forms of contraception. Do not breast-feed while taking this medication and for 5 months after the last dose. What side effects may I notice from receiving this medication? Side effects that you should report to your care team as soon as possible: Allergic reactions--skin rash, itching, hives, swelling of the face, lips, tongue, or throat Dry cough, shortness of breath or trouble breathing Eye pain, redness, irritation, or discharge with blurry or decreased vision Heart muscle inflammation--unusual weakness or fatigue, shortness of breath, chest pain, fast or irregular heartbeat, dizziness, swelling of the ankles, feet, or hands Hormone gland problems--headache, sensitivity to light, unusual weakness or fatigue, dizziness, fast or irregular heartbeat, increased sensitivity to cold or heat, excessive sweating, constipation, hair loss, increased thirst or amount of urine, tremors or shaking, irritability Infusion reactions--chest pain, shortness of breath or trouble breathing, feeling faint or lightheaded Kidney injury (glomerulonephritis)--decrease in the amount of urine, red or dark brown urine, foamy or bubbly urine, swelling of the ankles, hands, or feet Liver injury--right upper belly pain, loss of appetite, nausea, light-colored stool, dark yellow or brown urine, yellowing skin or  eyes, unusual weakness or fatigue Pain, tingling, or numbness in the hands or feet, muscle weakness, change in vision, confusion or trouble speaking, loss of balance or coordination, trouble walking, seizures Rash, fever, and swollen lymph nodes Redness, blistering, peeling, or loosening of the skin, including inside the mouth Sudden or severe stomach pain, bloody diarrhea, fever, nausea, vomiting Side effects that usually do  not require medical attention (report these to your care team if they continue or are bothersome): Bone, joint, or muscle pain Diarrhea Fatigue Loss of appetite Nausea Skin rash This list may not describe all possible side effects. Call your doctor for medical advice about side effects. You may report side effects to FDA at 1-800-FDA-1088. Where should I keep my medication? This medication is given in a hospital or clinic. It will not be stored at home. NOTE: This sheet is a summary. It may not cover all possible information. If you have questions about this medicine, talk to your doctor, pharmacist, or health care provider.  2024 Elsevier/Gold Standard (2021-11-22 00:00:00)  Oxaliplatin Injection What is this medication? OXALIPLATIN (ox AL i PLA tin) treats colorectal cancer. It works by slowing down the growth of cancer cells. This medicine may be used for other purposes; ask your health care provider or pharmacist if you have questions. COMMON BRAND NAME(S): Eloxatin What should I tell my care team before I take this medication? They need to know if you have any of these conditions: Heart disease History of irregular heartbeat or rhythm Liver disease Low blood cell levels (white cells, red cells, and platelets) Lung or breathing disease, such as asthma Take medications that treat or prevent blood clots Tingling of the fingers, toes, or other nerve disorder An unusual or allergic reaction to oxaliplatin, other medications, foods, dyes, or preservatives If you or your partner are pregnant or trying to get pregnant Breast-feeding How should I use this medication? This medication is injected into a vein. It is given by your care team in a hospital or clinic setting. Talk to your care team about the use of this medication in children. Special care may be needed. Overdosage: If you think you have taken too much of this medicine contact a poison control center or emergency room at  once. NOTE: This medicine is only for you. Do not share this medicine with others. What if I miss a dose? Keep appointments for follow-up doses. It is important not to miss a dose. Call your care team if you are unable to keep an appointment. What may interact with this medication? Do not take this medication with any of the following: Cisapride Dronedarone Pimozide Thioridazine This medication may also interact with the following: Aspirin and aspirin-like medications Certain medications that treat or prevent blood clots, such as warfarin, apixaban, dabigatran, and rivaroxaban Cisplatin Cyclosporine Diuretics Medications for infection, such as acyclovir, adefovir, amphotericin B, bacitracin, cidofovir, foscarnet, ganciclovir, gentamicin, pentamidine, vancomycin NSAIDs, medications for pain and inflammation, such as ibuprofen or naproxen Other medications that cause heart rhythm changes Pamidronate Zoledronic acid This list may not describe all possible interactions. Give your health care provider a list of all the medicines, herbs, non-prescription drugs, or dietary supplements you use. Also tell them if you smoke, drink alcohol, or use illegal drugs. Some items may interact with your medicine. What should I watch for while using this medication? Your condition will be monitored carefully while you are receiving this medication. You may need blood work while taking this medication. This medication may make you  feel generally unwell. This is not uncommon as chemotherapy can affect healthy cells as well as cancer cells. Report any side effects. Continue your course of treatment even though you feel ill unless your care team tells you to stop. This medication may increase your risk of getting an infection. Call your care team for advice if you get a fever, chills, sore throat, or other symptoms of a cold or flu. Do not treat yourself. Try to avoid being around people who are sick. Avoid  taking medications that contain aspirin, acetaminophen, ibuprofen, naproxen, or ketoprofen unless instructed by your care team. These medications may hide a fever. Be careful brushing or flossing your teeth or using a toothpick because you may get an infection or bleed more easily. If you have any dental work done, tell your dentist you are receiving this medication. This medication can make you more sensitive to cold. Do not drink cold drinks or use ice. Cover exposed skin before coming in contact with cold temperatures or cold objects. When out in cold weather wear warm clothing and cover your mouth and nose to warm the air that goes into your lungs. Tell your care team if you get sensitive to the cold. Talk to your care team if you or your partner are pregnant or think either of you might be pregnant. This medication can cause serious birth defects if taken during pregnancy and for 9 months after the last dose. A negative pregnancy test is required before starting this medication. A reliable form of contraception is recommended while taking this medication and for 9 months after the last dose. Talk to your care team about effective forms of contraception. Do not father a child while taking this medication and for 6 months after the last dose. Use a condom while having sex during this time period. Do not breastfeed while taking this medication and for 3 months after the last dose. This medication may cause infertility. Talk to your care team if you are concerned about your fertility. What side effects may I notice from receiving this medication? Side effects that you should report to your care team as soon as possible: Allergic reactions--skin rash, itching, hives, swelling of the face, lips, tongue, or throat Bleeding--bloody or black, tar-like stools, vomiting blood or brown material that looks like coffee grounds, red or dark brown urine, small red or purple spots on skin, unusual bruising or  bleeding Dry cough, shortness of breath or trouble breathing Heart rhythm changes--fast or irregular heartbeat, dizziness, feeling faint or lightheaded, chest pain, trouble breathing Infection--fever, chills, cough, sore throat, wounds that don't heal, pain or trouble when passing urine, general feeling of discomfort or being unwell Liver injury--right upper belly pain, loss of appetite, nausea, light-colored stool, dark yellow or brown urine, yellowing skin or eyes, unusual weakness or fatigue Low red blood cell level--unusual weakness or fatigue, dizziness, headache, trouble breathing Muscle injury--unusual weakness or fatigue, muscle pain, dark yellow or brown urine, decrease in amount of urine Pain, tingling, or numbness in the hands or feet Sudden and severe headache, confusion, change in vision, seizures, which may be signs of posterior reversible encephalopathy syndrome (PRES) Unusual bruising or bleeding Side effects that usually do not require medical attention (report to your care team if they continue or are bothersome): Diarrhea Nausea Pain, redness, or swelling with sores inside the mouth or throat Unusual weakness or fatigue Vomiting This list may not describe all possible side effects. Call your doctor for medical advice about side  effects. You may report side effects to FDA at 1-800-FDA-1088. Where should I keep my medication? This medication is given in a hospital or clinic. It will not be stored at home. NOTE: This sheet is a summary. It may not cover all possible information. If you have questions about this medicine, talk to your doctor, pharmacist, or health care provider.  2024 Elsevier/Gold Standard (2022-05-10 00:00:00)   Leucovorin Injection What is this medication? LEUCOVORIN (loo koe VOR in) prevents side effects from certain medications, such as methotrexate. It works by increasing folate levels. This helps protect healthy cells in your body. It may also be used  to treat anemia caused by low levels of folate. It can also be used with fluorouracil, a type of chemotherapy, to treat colorectal cancer. It works by increasing the effects of fluorouracil in the body. This medicine may be used for other purposes; ask your health care provider or pharmacist if you have questions. What should I tell my care team before I take this medication? They need to know if you have any of these conditions: Anemia from low levels of vitamin B12 in the blood An unusual or allergic reaction to leucovorin, folic acid, other medications, foods, dyes, or preservatives Pregnant or trying to get pregnant Breastfeeding How should I use this medication? This medication is injected into a vein or a muscle. It is given by your care team in a hospital or clinic setting. Talk to your care team about the use of this medication in children. Special care may be needed. Overdosage: If you think you have taken too much of this medicine contact a poison control center or emergency room at once. NOTE: This medicine is only for you. Do not share this medicine with others. What if I miss a dose? Keep appointments for follow-up doses. It is important not to miss your dose. Call your care team if you are unable to keep an appointment. What may interact with this medication? Capecitabine Fluorouracil Phenobarbital Phenytoin Primidone Trimethoprim;sulfamethoxazole This list may not describe all possible interactions. Give your health care provider a list of all the medicines, herbs, non-prescription drugs, or dietary supplements you use. Also tell them if you smoke, drink alcohol, or use illegal drugs. Some items may interact with your medicine. What should I watch for while using this medication? Your condition will be monitored carefully while you are receiving this medication. This medication may increase the side effects of 5-fluorouracil. Tell your care team if you have diarrhea or mouth  sores that do not get better or that get worse. What side effects may I notice from receiving this medication? Side effects that you should report to your care team as soon as possible: Allergic reactions--skin rash, itching, hives, swelling of the face, lips, tongue, or throat This list may not describe all possible side effects. Call your doctor for medical advice about side effects. You may report side effects to FDA at 1-800-FDA-1088. Where should I keep my medication? This medication is given in a hospital or clinic. It will not be stored at home. NOTE: This sheet is a summary. It may not cover all possible information. If you have questions about this medicine, talk to your doctor, pharmacist, or health care provider.  2024 Elsevier/Gold Standard (2021-12-28 00:00:00)   Fluorouracil Injection What is this medication? FLUOROURACIL (flure oh YOOR a sil) treats some types of cancer. It works by slowing down the growth of cancer cells. This medicine may be used for other purposes; ask  your health care provider or pharmacist if you have questions. COMMON BRAND NAME(S): Adrucil What should I tell my care team before I take this medication? They need to know if you have any of these conditions: Blood disorders Dihydropyrimidine dehydrogenase (DPD) deficiency Infection, such as chickenpox, cold sores, herpes Kidney disease Liver disease Poor nutrition Recent or ongoing radiation therapy An unusual or allergic reaction to fluorouracil, other medications, foods, dyes, or preservatives If you or your partner are pregnant or trying to get pregnant Breast-feeding How should I use this medication? This medication is injected into a vein. It is administered by your care team in a hospital or clinic setting. Talk to your care team about the use of this medication in children. Special care may be needed. Overdosage: If you think you have taken too much of this medicine contact a poison control  center or emergency room at once. NOTE: This medicine is only for you. Do not share this medicine with others. What if I miss a dose? Keep appointments for follow-up doses. It is important not to miss your dose. Call your care team if you are unable to keep an appointment. What may interact with this medication? Do not take this medication with any of the following: Live virus vaccines This medication may also interact with the following: Medications that treat or prevent blood clots, such as warfarin, enoxaparin, dalteparin This list may not describe all possible interactions. Give your health care provider a list of all the medicines, herbs, non-prescription drugs, or dietary supplements you use. Also tell them if you smoke, drink alcohol, or use illegal drugs. Some items may interact with your medicine. What should I watch for while using this medication? Your condition will be monitored carefully while you are receiving this medication. This medication may make you feel generally unwell. This is not uncommon as chemotherapy can affect healthy cells as well as cancer cells. Report any side effects. Continue your course of treatment even though you feel ill unless your care team tells you to stop. In some cases, you may be given additional medications to help with side effects. Follow all directions for their use. This medication may increase your risk of getting an infection. Call your care team for advice if you get a fever, chills, sore throat, or other symptoms of a cold or flu. Do not treat yourself. Try to avoid being around people who are sick. This medication may increase your risk to bruise or bleed. Call your care team if you notice any unusual bleeding. Be careful brushing or flossing your teeth or using a toothpick because you may get an infection or bleed more easily. If you have any dental work done, tell your dentist you are receiving this medication. Avoid taking medications that  contain aspirin, acetaminophen, ibuprofen, naproxen, or ketoprofen unless instructed by your care team. These medications may hide a fever. Do not treat diarrhea with over the counter products. Contact your care team if you have diarrhea that lasts more than 2 days or if it is severe and watery. This medication can make you more sensitive to the sun. Keep out of the sun. If you cannot avoid being in the sun, wear protective clothing and sunscreen. Do not use sun lamps, tanning beds, or tanning booths. Talk to your care team if you or your partner wish to become pregnant or think you might be pregnant. This medication can cause serious birth defects if taken during pregnancy and for 3 months after the  last dose. A reliable form of contraception is recommended while taking this medication and for 3 months after the last dose. Talk to your care team about effective forms of contraception. Do not father a child while taking this medication and for 3 months after the last dose. Use a condom while having sex during this time period. Do not breastfeed while taking this medication. This medication may cause infertility. Talk to your care team if you are concerned about your fertility. What side effects may I notice from receiving this medication? Side effects that you should report to your care team as soon as possible: Allergic reactions--skin rash, itching, hives, swelling of the face, lips, tongue, or throat Heart attack--pain or tightness in the chest, shoulders, arms, or jaw, nausea, shortness of breath, cold or clammy skin, feeling faint or lightheaded Heart failure--shortness of breath, swelling of the ankles, feet, or hands, sudden weight gain, unusual weakness or fatigue Heart rhythm changes--fast or irregular heartbeat, dizziness, feeling faint or lightheaded, chest pain, trouble breathing High ammonia level--unusual weakness or fatigue, confusion, loss of appetite, nausea, vomiting,  seizures Infection--fever, chills, cough, sore throat, wounds that don't heal, pain or trouble when passing urine, general feeling of discomfort or being unwell Low red blood cell level--unusual weakness or fatigue, dizziness, headache, trouble breathing Pain, tingling, or numbness in the hands or feet, muscle weakness, change in vision, confusion or trouble speaking, loss of balance or coordination, trouble walking, seizures Redness, swelling, and blistering of the skin over hands and feet Severe or prolonged diarrhea Unusual bruising or bleeding Side effects that usually do not require medical attention (report to your care team if they continue or are bothersome): Dry skin Headache Increased tears Nausea Pain, redness, or swelling with sores inside the mouth or throat Sensitivity to light Vomiting This list may not describe all possible side effects. Call your doctor for medical advice about side effects. You may report side effects to FDA at 1-800-FDA-1088. Where should I keep my medication? This medication is given in a hospital or clinic. It will not be stored at home. NOTE: This sheet is a summary. It may not cover all possible information. If you have questions about this medicine, talk to your doctor, pharmacist, or health care provider.  2024 Elsevier/Gold Standard (2021-11-30 00:00:00)

## 2023-08-30 NOTE — Patient Instructions (Signed)

## 2023-08-30 NOTE — Progress Notes (Signed)
Brief nutrition follow up completed with patient and wife during infusion for Gastric cancer. He is followed by Dr. Truett Perna. Receiving cycle 2 FOLFOX and nivolumab today.  Weight decreased to 159 pounds Jan 22 from 165# 6.4 oz Jan 8. This is 4# weight loss over 2 weeks.  Labs reviewed. K 3.4 and Glucose 118.  Patient has increased vomiting. Vomited once before coming to infusion and a second time during our follow up. RN aware. He has not been able to keep much down per wife. He tried some chicken noodle soup, applesauce and yogurt however, vomited after. He was taking morphine and was told to eat something before taking it. Patient tried this but still couldn't keep food down. MD suspects vomiting related to tumor involving stomach and abdominal cavity. Pain medication will be changed to hydromorphone as needed.  Nutrition Diagnosis: Unintended wt loss continues.  Intervention: Educated to try small amounts of liquids and bland foods throughout the day. Take medications as prescribed. Support and encouragement provided.  Monitoring, Evaluation, Goals: Tolerate increased calories and protein to minimize wt loss  Next Visit: Wednesday, Feb 5, during infusion.

## 2023-08-30 NOTE — Progress Notes (Signed)
[facility] OFFICE PROGRESS NOTE   Diagnosis: Gastric cancer  INTERVAL HISTORY:   [name] returns as scheduled.  He completed a cycle of FOLFOX 08/16/2023.  No mouth sores, diarrhea, or acute nausea.  He had cold sensitivity for a few days following chemotherapy.  No neuropathy symptoms at present.  He continues to have abdominal pain.  He has intermittent episodes of vomiting, but no nausea.  He feels MS Contin contributing to the vomiting.  He is tolerating liquids.  He had a bowel movement this morning.  Objective:  Vital signs in last 24 hours:  Blood pressure 124/87, pulse (!) 116, temperature 98.7 F (37.1 C), temperature source Temporal, resp. rate 18, height 6\' 1"  (1.854 m), weight 159 lb (72.1 kg), SpO2 100%.    HEENT: No thrush or ulcers, mucous membranes are moist Resp: Lungs clear bilaterally Cardio: Regular rate and rhythm GI: Distended, nontender, no mass Vascular: No leg edema  Skin: Dryness of the hands  Portacath/PICC-without erythema  Lab Results:  Lab Results  Component Value Date   WBC 4.9 08/30/2023   HGB 13.9 08/30/2023   HCT 40.6 08/30/2023   MCV 90.6 08/30/2023   PLT 319 08/30/2023   NEUTROABS 3.0 08/30/2023    CMP  Lab Results  Component Value Date   NA 135 08/30/2023   K 3.4 (L) 08/30/2023   CL 95 (L) 08/30/2023   CO2 28 08/30/2023   GLUCOSE 118 (H) 08/30/2023   BUN 11 08/30/2023   CREATININE 1.14 08/30/2023   CALCIUM 10.2 08/30/2023   PROT 6.8 08/30/2023   ALBUMIN 3.9 08/30/2023   AST 13 (L) 08/30/2023   ALT 9 08/30/2023   ALKPHOS 98 08/30/2023   BILITOT 0.5 08/30/2023   GFRNONAA >60 08/30/2023   GFRAA >60 07/11/2019    Lab Results  Component Value Date   CEA 1.31 08/11/2023    Lab Results  Component Value Date   INR 1.1 07/19/2023   LABPROT 14.7 07/19/2023    Imaging:  No results found.  Medications: I have reviewed the patient's current medications.   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  History of recurrent venous thrombosis-maintained on apixaban anticoagulation 4.  Tobacco and alcohol use 5.  Family history of multiple cancers 6.  Anorexia/weight loss and constipation secondary to #1 7.  Left hip replacement June 2024    Disposition: [name] has metastatic gastric cancer.  He has completed 1 cycle of FOLFOX/nivolumab.  He tolerated the treatment well.  He continues to have intermittent episodes of vomiting, likely secondary to tumor involving the stomach and abdominal cavity.  I recommended he continue a liquid diet with frequent small meals.  He will discontinue MS Contin and continue hydromorphone as needed for pain.  He will call for persistent vomiting or  inability to tolerate liquids. [name] will complete another cycle of FOLFOX/nivolumab today.  He will return for an office visit and chemotherapy in 2 weeks.    Thornton Papas, MD  08/30/2023  10:04 AM

## 2023-08-30 NOTE — Progress Notes (Signed)
Patient seen by Dr. Thornton Papas today  Vitals are within treatment parameters:No (Please specify and give further instructions.) OK to proceed w/pulse 110-116  Labs are within treatment parameters: Yes   Treatment plan has been signed: Yes   Per physician team, Patient is ready for treatment. Please note the following modifications: Dose reduction of Oxaliplatin, 5FU bolus and Leucovorin.

## 2023-09-01 ENCOUNTER — Inpatient Hospital Stay: Payer: 59

## 2023-09-01 VITALS — BP 125/99 | HR 116 | Temp 97.6°F | Resp 18

## 2023-09-01 DIAGNOSIS — Z5111 Encounter for antineoplastic chemotherapy: Secondary | ICD-10-CM | POA: Diagnosis not present

## 2023-09-01 DIAGNOSIS — C163 Malignant neoplasm of pyloric antrum: Secondary | ICD-10-CM

## 2023-09-01 MED ORDER — HEPARIN SOD (PORK) LOCK FLUSH 100 UNIT/ML IV SOLN
500.0000 [IU] | Freq: Once | INTRAVENOUS | Status: AC | PRN
  Administered 2023-09-01: 500 [IU]

## 2023-09-01 MED ORDER — SODIUM CHLORIDE 0.9% FLUSH
10.0000 mL | INTRAVENOUS | Status: DC | PRN
Start: 2023-09-01 — End: 2023-09-01
  Administered 2023-09-01: 10 mL

## 2023-09-01 NOTE — Patient Instructions (Signed)
Implanted [facility] Guide An implanted port is a device that is placed under the skin. It is usually placed in the chest. The device may vary based on the need. Implanted ports can be used to give IV medicine, to take blood, or to give fluids. You may have an implanted port if: You need IV medicine that would be irritating to the small veins in your hands or arms. You need IV medicines, such as chemotherapy, for a long period of time. You need IV nutrition for a long period of time. You may have fewer limitations when using a port than you would if you used other types of long-term IVs. You will also likely be able to return to normal activities after your incision heals. An implanted port has two main parts: Reservoir. The reservoir is the part where a needle is inserted to give medicines or draw blood. The reservoir is round. After the port is placed, it appears as a small, raised area under your skin. Catheter. The catheter is a small, thin tube that connects the reservoir to a vein. Medicine that is inserted into the reservoir goes into the catheter and then into the vein. How is my port accessed? To access your port: A numbing cream may be placed on the skin over the port site. Your health care provider will put on a mask and sterile gloves. The skin over your port will be cleaned carefully with a germ-killing soap and allowed to dry. Your health care provider will gently pinch the port and insert a needle into it. Your health care provider will check for a blood return to make sure the port is in the vein and is still working (patent). If your port needs to remain accessed to get medicine continuously (constant infusion), your health care provider will place a clear bandage (dressing) over the needle site. The dressing and needle will need to be changed every week, or as told by your health care provider. What is flushing? Flushing helps keep the port working. Follow instructions from your  health care provider about how and when to flush the port. Ports are usually flushed with saline solution or a medicine called heparin. The need for flushing will depend on how the port is used: If the port is only used from time to time to give medicines or draw blood, the port may need to be flushed: Before and after medicines have been given. Before and after blood has been drawn. As part of routine maintenance. Flushing may be recommended every 4-6 weeks. If a constant infusion is running, the port may not need to be flushed. Throw away any syringes in a disposal container that is meant for sharp items (sharps container). You can buy a sharps container from a pharmacy, or you can make one by using an empty hard plastic bottle with a cover. How long will my port stay implanted? The port can stay in for as long as your health care provider thinks it is needed. When it is time for the port to come out, a surgery will be done to remove it. The surgery will be similar to the procedure that was done to put the port in. Follow these instructions at home: Caring for your port and port site Flush your port as told by your health care provider. If you need an infusion over several days, follow instructions from your health care provider about how to take care of your port site. Make sure you: Change your  dressing as told by your health care provider. Wash your hands with soap and water for at least 20 seconds before and after you change your dressing. If soap and water are not available, use alcohol-based hand sanitizer. Place any used dressings or infusion bags into a plastic bag. Throw that bag in the trash. Keep the dressing that covers the needle clean and dry. Do not get it wet. Do not use scissors or sharp objects near the infusion tubing. Keep any external tubes clamped, unless they are being used. Check your port site every day for signs of infection. Check for: Redness, swelling, or  pain. Fluid or blood. Warmth. Pus or a bad smell. Protect the skin around the port site. Avoid wearing bra straps that rub or irritate the site. Protect the skin around your port from seat belts. Place a soft pad over your chest if needed. Bathe or shower as told by your health care provider. The site may get wet as long as you are not actively receiving an infusion. General instructions  Return to your normal activities as told by your health care provider. Ask your health care provider what activities are safe for you. Carry a medical alert card or wear a medical alert bracelet at all times. This will let health care providers know that you have an implanted port in case of an emergency. Where to find more information American Cancer Society: www.cancer.org American Society of Clinical Oncology: www.cancer.net Contact a health care provider if: You have a fever or chills. You have redness, swelling, or pain at the port site. You have fluid or blood coming from your port site. Your incision feels warm to the touch. You have pus or a bad smell coming from the port site. Summary Implanted ports are usually placed in the chest for long-term IV access. Follow instructions from your health care provider about flushing the port and changing bandages (dressings). Take care of the area around your port by avoiding clothing that puts pressure on the area, and by watching for signs of infection. Protect the skin around your port from seat belts. Place a soft pad over your chest if needed. Contact a health care provider if you have a fever or you have redness, swelling, pain, fluid, or a bad smell at the port site. This information is not intended to replace advice given to you by your health care provider. Make sure you discuss any questions you have with your health care provider. Document Revised: 01/26/2021 Document Reviewed: 01/26/2021 Elsevier Patient Education  2024 [employer].

## 2023-09-06 ENCOUNTER — Other Ambulatory Visit (HOSPITAL_COMMUNITY): Payer: Self-pay

## 2023-09-06 ENCOUNTER — Telehealth: Payer: Self-pay | Admitting: *Deleted

## 2023-09-06 ENCOUNTER — Encounter: Payer: Self-pay | Admitting: Oncology

## 2023-09-06 DIAGNOSIS — C163 Malignant neoplasm of pyloric antrum: Secondary | ICD-10-CM

## 2023-09-06 MED ORDER — SORBITOL 70 % SOLN
15.0000 mL | 1 refills | Status: DC
  Filled 2023-09-06: qty 473, 10d supply, fill #0

## 2023-09-06 NOTE — Telephone Encounter (Signed)
Reports no good BM since Saturday. Has small hard balls. Has been taking MirLax daily. Instructed him to take MiraLax 17 grams bid and start Senna-S #2 bid on a routine basis.  Drink 1/2 bottle Magnesium citrate now and call this afternoon before 4 pm is he is not starting to have some results. He agrees to this plan.

## 2023-09-06 NOTE — Telephone Encounter (Signed)
Mrs. Henken reports he still has not had a BM. Sent script for Sorbitol 15 ml every 6 hours till BM and then daily if needed. Continue the MiraLax and Senna-S. She will go now to pick up medication at [facility].

## 2023-09-06 NOTE — Telephone Encounter (Signed)
Mrs. [name] called to report he has done as was instructed and drank 1/2 bottle of magnesium citrate with no results. Also vomited x 1 this afternoon. Asking for next steps.

## 2023-09-06 NOTE — Addendum Note (Signed)
Addended by: Wandalee Ferdinand on: 09/06/2023 04:58 PM   Modules accepted: Orders

## 2023-09-10 ENCOUNTER — Other Ambulatory Visit: Payer: Self-pay | Admitting: Oncology

## 2023-09-13 ENCOUNTER — Inpatient Hospital Stay: Payer: Medicaid Other

## 2023-09-13 ENCOUNTER — Inpatient Hospital Stay (HOSPITAL_BASED_OUTPATIENT_CLINIC_OR_DEPARTMENT_OTHER): Payer: Medicaid Other | Admitting: Oncology

## 2023-09-13 ENCOUNTER — Inpatient Hospital Stay: Payer: Medicaid Other | Admitting: Nutrition

## 2023-09-13 ENCOUNTER — Inpatient Hospital Stay: Payer: Medicaid Other | Attending: Oncology

## 2023-09-13 ENCOUNTER — Encounter: Payer: Self-pay | Admitting: Oncology

## 2023-09-13 VITALS — BP 124/93 | HR 110 | Temp 97.9°F | Resp 18 | Ht 73.0 in | Wt 159.0 lb

## 2023-09-13 VITALS — HR 98

## 2023-09-13 DIAGNOSIS — Z5112 Encounter for antineoplastic immunotherapy: Secondary | ICD-10-CM | POA: Insufficient documentation

## 2023-09-13 DIAGNOSIS — Z5111 Encounter for antineoplastic chemotherapy: Secondary | ICD-10-CM | POA: Insufficient documentation

## 2023-09-13 DIAGNOSIS — R634 Abnormal weight loss: Secondary | ICD-10-CM | POA: Diagnosis not present

## 2023-09-13 DIAGNOSIS — R112 Nausea with vomiting, unspecified: Secondary | ICD-10-CM | POA: Diagnosis not present

## 2023-09-13 DIAGNOSIS — Z86718 Personal history of other venous thrombosis and embolism: Secondary | ICD-10-CM | POA: Diagnosis not present

## 2023-09-13 DIAGNOSIS — K5900 Constipation, unspecified: Secondary | ICD-10-CM | POA: Diagnosis not present

## 2023-09-13 DIAGNOSIS — C169 Malignant neoplasm of stomach, unspecified: Secondary | ICD-10-CM | POA: Diagnosis not present

## 2023-09-13 DIAGNOSIS — C786 Secondary malignant neoplasm of retroperitoneum and peritoneum: Secondary | ICD-10-CM | POA: Diagnosis not present

## 2023-09-13 DIAGNOSIS — E876 Hypokalemia: Secondary | ICD-10-CM | POA: Diagnosis not present

## 2023-09-13 DIAGNOSIS — C163 Malignant neoplasm of pyloric antrum: Secondary | ICD-10-CM

## 2023-09-13 DIAGNOSIS — Z96642 Presence of left artificial hip joint: Secondary | ICD-10-CM | POA: Diagnosis not present

## 2023-09-13 DIAGNOSIS — Z5189 Encounter for other specified aftercare: Secondary | ICD-10-CM | POA: Diagnosis not present

## 2023-09-13 DIAGNOSIS — G893 Neoplasm related pain (acute) (chronic): Secondary | ICD-10-CM | POA: Insufficient documentation

## 2023-09-13 LAB — CBC WITH DIFFERENTIAL (CANCER CENTER ONLY)
Abs Immature Granulocytes: 0 10*3/uL (ref 0.00–0.07)
Basophils Absolute: 0 10*3/uL (ref 0.0–0.1)
Basophils Relative: 0 %
Eosinophils Absolute: 0 10*3/uL (ref 0.0–0.5)
Eosinophils Relative: 1 %
HCT: 38.2 % — ABNORMAL LOW (ref 39.0–52.0)
Hemoglobin: 13 g/dL (ref 13.0–17.0)
Immature Granulocytes: 0 %
Lymphocytes Relative: 25 %
Lymphs Abs: 0.9 10*3/uL (ref 0.7–4.0)
MCH: 30.6 pg (ref 26.0–34.0)
MCHC: 34 g/dL (ref 30.0–36.0)
MCV: 89.9 fL (ref 80.0–100.0)
Monocytes Absolute: 0.3 10*3/uL (ref 0.1–1.0)
Monocytes Relative: 8 %
Neutro Abs: 2.5 10*3/uL (ref 1.7–7.7)
Neutrophils Relative %: 66 %
Platelet Count: 213 10*3/uL (ref 150–400)
RBC: 4.25 MIL/uL (ref 4.22–5.81)
RDW: 13.4 % (ref 11.5–15.5)
WBC Count: 3.8 10*3/uL — ABNORMAL LOW (ref 4.0–10.5)
nRBC: 0 % (ref 0.0–0.2)

## 2023-09-13 LAB — CMP (CANCER CENTER ONLY)
ALT: 10 U/L (ref 0–44)
AST: 14 U/L — ABNORMAL LOW (ref 15–41)
Albumin: 3.4 g/dL — ABNORMAL LOW (ref 3.5–5.0)
Alkaline Phosphatase: 79 U/L (ref 38–126)
Anion gap: 13 (ref 5–15)
BUN: 13 mg/dL (ref 6–20)
CO2: 23 mmol/L (ref 22–32)
Calcium: 9.8 mg/dL (ref 8.9–10.3)
Chloride: 98 mmol/L (ref 98–111)
Creatinine: 0.89 mg/dL (ref 0.61–1.24)
GFR, Estimated: >60 mL/min (ref >60)
Glucose, Bld: 90 mg/dL (ref 70–99)
Potassium: 3.8 mmol/L (ref 3.5–5.1)
Sodium: 134 mmol/L — ABNORMAL LOW (ref 135–145)
Total Bilirubin: 0.5 mg/dL (ref 0.0–1.2)
Total Protein: 6.7 g/dL (ref 6.5–8.1)

## 2023-09-13 MED ORDER — NIVOLUMAB CHEMO INJECTION 100 MG/10ML
240.0000 mg | Freq: Once | INTRAVENOUS | Status: AC
  Administered 2023-09-13: 240 mg via INTRAVENOUS
  Filled 2023-09-13: qty 20

## 2023-09-13 MED ORDER — NICOTINE 14 MG/24HR TD PT24: 14.0000 mg | MEDICATED_PATCH | Freq: Every day | TRANSDERMAL | 0 refills | Status: DC

## 2023-09-13 MED ORDER — LEUCOVORIN CALCIUM INJECTION 350 MG
400.0000 mg/m2 | Freq: Once | INTRAVENOUS | Status: AC
  Administered 2023-09-13: 772 mg via INTRAVENOUS
  Filled 2023-09-13: qty 38.6

## 2023-09-13 MED ORDER — DEXAMETHASONE SODIUM PHOSPHATE 10 MG/ML IJ SOLN
10.0000 mg | Freq: Once | INTRAMUSCULAR | Status: AC
  Administered 2023-09-13: 10 mg via INTRAVENOUS
  Filled 2023-09-13: qty 1

## 2023-09-13 MED ORDER — OXALIPLATIN CHEMO INJECTION 100 MG/20ML
85.0000 mg/m2 | Freq: Once | INTRAVENOUS | Status: AC
  Administered 2023-09-13: 150 mg via INTRAVENOUS
  Filled 2023-09-13: qty 20

## 2023-09-13 MED ORDER — PALONOSETRON HCL INJECTION 0.25 MG/5ML
0.2500 mg | Freq: Once | INTRAVENOUS | Status: AC
  Administered 2023-09-13: 0.25 mg via INTRAVENOUS
  Filled 2023-09-13: qty 5

## 2023-09-13 MED ORDER — FLUOROURACIL CHEMO INJECTION 2.5 GM/50ML
400.0000 mg/m2 | Freq: Once | INTRAVENOUS | Status: AC
  Administered 2023-09-13: 750 mg via INTRAVENOUS
  Filled 2023-09-13: qty 15

## 2023-09-13 MED ORDER — DEXTROSE 5 % IV SOLN
INTRAVENOUS | Status: DC
Start: 2023-09-13 — End: 2023-09-13

## 2023-09-13 MED ORDER — SODIUM CHLORIDE 0.9 % IV SOLN
2400.0000 mg/m2 | INTRAVENOUS | Status: DC
  Administered 2023-09-13: 5000 mg via INTRAVENOUS
  Filled 2023-09-13: qty 100

## 2023-09-13 NOTE — Patient Instructions (Signed)
 [facility] A DE[facility]  Discharge Instructions: Thank you for choosing [facility] to provide your oncology and hematology care.   If you have a lab appointment with the Cancer Center, please go directly to the Cancer Center and check in at the registration area.   Wear comfortable clothing and clothing appropriate for easy access to any Portacath or PICC line.   We strive to give you quality time with your provider. You may need to reschedule your appointment if you arrive late (15 or more minutes).  Arriving late affects you and other patients whose appointments are after yours.  Also, if you miss three or more appointments without notifying the office, you may be dismissed from the clinic at the provider's discretion.      For prescription refill requests, have your pharmacy contact our office and allow 72 hours for refills to be completed.    Today you received the following chemotherapy and/or immunotherapy agents Nivolumab/Opdivo, Oxaliplatin, Leucovorin and Fluorouracil      To help prevent nausea and vomiting after your treatment, we encourage you to take your nausea medication as directed.  BELOW ARE SYMPTOMS THAT SHOULD BE REPORTED IMMEDIATELY: *FEVER GREATER THAN 100.4 F (38 C) OR HIGHER *CHILLS OR SWEATING *NAUSEA AND VOMITING THAT IS NOT CONTROLLED WITH YOUR NAUSEA MEDICATION *UNUSUAL SHORTNESS OF BREATH *UNUSUAL BRUISING OR BLEEDING *URINARY PROBLEMS (pain or burning when urinating, or frequent urination) *BOWEL PROBLEMS (unusual diarrhea, constipation, pain near the anus) TENDERNESS IN MOUTH AND THROAT WITH OR WITHOUT PRESENCE OF ULCERS (sore throat, sores in mouth, or a toothache) UNUSUAL RASH, SWELLING OR PAIN  UNUSUAL VAGINAL DISCHARGE OR ITCHING   Items with * indicate a potential emergency and should be followed up as soon as possible or go to the Emergency Department if any problems should occur.  Please  show the CHEMOTHERAPY ALERT CARD or IMMUNOTHERAPY ALERT CARD at check-in to the Emergency Department and triage nurse.  Should you have questions after your visit or need to cancel or reschedule your appointment, please contact [facility]  Dept: [phone]  and follow the prompts.  Office hours are 8:00 a.m. to 4:30 p.m. Monday - Friday. Please note that voicemails left after 4:00 p.m. may not be returned until the following business day.  We are closed weekends and major holidays. You have access to a nurse at all times for urgent questions. Please call the main number to the clinic Dept: [phone] and follow the prompts.   For any non-urgent questions, you may also contact your provider using MyChart. We now offer e-Visits for anyone 41 and older to request care online for non-urgent symptoms. For details visit [email or web address].   Also download the MyChart app! Go to the app store, search "MyChart", open the app, select [place], and log in with your MyChart username and password.  Nivolumab Injection What is this medication? NIVOLUMAB (nye VOL ue mab) treats some types of cancer. It works by helping your immune system slow or stop the spread of cancer cells. It is a monoclonal antibody. This medicine may be used for other purposes; ask your health care provider or pharmacist if you have questions. COMMON BRAND NAME(S): Opdivo What should I tell my care team before I take this medication? They need to know if you have any of these conditions: Allogeneic stem cell transplant (uses someone else's stem cells) Autoimmune  diseases, such as Crohn disease, ulcerative colitis, lupus History of chest radiation Nervous system problems, such as Guillain-Barre syndrome or myasthenia gravis Organ transplant An unusual or allergic reaction to nivolumab, other medications, foods, dyes, or preservatives Pregnant or trying to get  pregnant Breast-feeding How should I use this medication? This medication is infused into a vein. It is given in a hospital or clinic setting. A special MedGuide will be given to you before each treatment. Be sure to read this information carefully each time. Talk to your care team about the use of this medication in children. While it may be prescribed for children as young as 12 years for selected conditions, precautions do apply. Overdosage: If you think you have taken too much of this medicine contact a poison control center or emergency room at once. NOTE: This medicine is only for you. Do not share this medicine with others. What if I miss a dose? Keep appointments for follow-up doses. It is important not to miss your dose. Call your care team if you are unable to keep an appointment. What may interact with this medication? Interactions have not been studied. This list may not describe all possible interactions. Give your health care provider a list of all the medicines, herbs, non-prescription drugs, or dietary supplements you use. Also tell them if you smoke, drink alcohol, or use illegal drugs. Some items may interact with your medicine. What should I watch for while using this medication? Your condition will be monitored carefully while you are receiving this medication. You may need blood work while taking this medication. This medication may cause serious skin reactions. They can happen weeks to months after starting the medication. Contact your care team right away if you notice fevers or flu-like symptoms with a rash. The rash may be red or purple and then turn into blisters or peeling of the skin. You may also notice a red rash with swelling of the face, lips, or lymph nodes in your neck or under your arms. Tell your care team right away if you have any change in your eyesight. Talk to your care team if you are pregnant or think you might be pregnant. A negative pregnancy test is  required before starting this medication. A reliable form of contraception is recommended while taking this medication and for 5 months after the last dose. Talk to your care team about effective forms of contraception. Do not breast-feed while taking this medication and for 5 months after the last dose. What side effects may I notice from receiving this medication? Side effects that you should report to your care team as soon as possible: Allergic reactions--skin rash, itching, hives, swelling of the face, lips, tongue, or throat Dry cough, shortness of breath or trouble breathing Eye pain, redness, irritation, or discharge with blurry or decreased vision Heart muscle inflammation--unusual weakness or fatigue, shortness of breath, chest pain, fast or irregular heartbeat, dizziness, swelling of the ankles, feet, or hands Hormone gland problems--headache, sensitivity to light, unusual weakness or fatigue, dizziness, fast or irregular heartbeat, increased sensitivity to cold or heat, excessive sweating, constipation, hair loss, increased thirst or amount of urine, tremors or shaking, irritability Infusion reactions--chest pain, shortness of breath or trouble breathing, feeling faint or lightheaded Kidney injury (glomerulonephritis)--decrease in the amount of urine, red or dark brown urine, foamy or bubbly urine, swelling of the ankles, hands, or feet Liver injury--right upper belly pain, loss of appetite, nausea, light-colored stool, dark yellow or brown urine, yellowing skin or  eyes, unusual weakness or fatigue Pain, tingling, or numbness in the hands or feet, muscle weakness, change in vision, confusion or trouble speaking, loss of balance or coordination, trouble walking, seizures Rash, fever, and swollen lymph nodes Redness, blistering, peeling, or loosening of the skin, including inside the mouth Sudden or severe stomach pain, bloody diarrhea, fever, nausea, vomiting Side effects that usually do  not require medical attention (report these to your care team if they continue or are bothersome): Bone, joint, or muscle pain Diarrhea Fatigue Loss of appetite Nausea Skin rash This list may not describe all possible side effects. Call your doctor for medical advice about side effects. You may report side effects to FDA at 1-800-FDA-1088. Where should I keep my medication? This medication is given in a hospital or clinic. It will not be stored at home. NOTE: This sheet is a summary. It may not cover all possible information. If you have questions about this medicine, talk to your doctor, pharmacist, or health care provider.  2024 Elsevier/Gold Standard (2021-11-22 00:00:00)  Oxaliplatin Injection What is this medication? OXALIPLATIN (ox AL i PLA tin) treats colorectal cancer. It works by slowing down the growth of cancer cells. This medicine may be used for other purposes; ask your health care provider or pharmacist if you have questions. COMMON BRAND NAME(S): Eloxatin What should I tell my care team before I take this medication? They need to know if you have any of these conditions: Heart disease History of irregular heartbeat or rhythm Liver disease Low blood cell levels (white cells, red cells, and platelets) Lung or breathing disease, such as asthma Take medications that treat or prevent blood clots Tingling of the fingers, toes, or other nerve disorder An unusual or allergic reaction to oxaliplatin, other medications, foods, dyes, or preservatives If you or your partner are pregnant or trying to get pregnant Breast-feeding How should I use this medication? This medication is injected into a vein. It is given by your care team in a hospital or clinic setting. Talk to your care team about the use of this medication in children. Special care may be needed. Overdosage: If you think you have taken too much of this medicine contact a poison control center or emergency room at  once. NOTE: This medicine is only for you. Do not share this medicine with others. What if I miss a dose? Keep appointments for follow-up doses. It is important not to miss a dose. Call your care team if you are unable to keep an appointment. What may interact with this medication? Do not take this medication with any of the following: Cisapride Dronedarone Pimozide Thioridazine This medication may also interact with the following: Aspirin and aspirin-like medications Certain medications that treat or prevent blood clots, such as warfarin, apixaban, dabigatran, and rivaroxaban Cisplatin Cyclosporine Diuretics Medications for infection, such as acyclovir, adefovir, amphotericin B, bacitracin, cidofovir, foscarnet, ganciclovir, gentamicin, pentamidine, vancomycin NSAIDs, medications for pain and inflammation, such as ibuprofen or naproxen Other medications that cause heart rhythm changes Pamidronate Zoledronic acid This list may not describe all possible interactions. Give your health care provider a list of all the medicines, herbs, non-prescription drugs, or dietary supplements you use. Also tell them if you smoke, drink alcohol, or use illegal drugs. Some items may interact with your medicine. What should I watch for while using this medication? Your condition will be monitored carefully while you are receiving this medication. You may need blood work while taking this medication. This medication may make you  feel generally unwell. This is not uncommon as chemotherapy can affect healthy cells as well as cancer cells. Report any side effects. Continue your course of treatment even though you feel ill unless your care team tells you to stop. This medication may increase your risk of getting an infection. Call your care team for advice if you get a fever, chills, sore throat, or other symptoms of a cold or flu. Do not treat yourself. Try to avoid being around people who are sick. Avoid  taking medications that contain aspirin, acetaminophen, ibuprofen, naproxen, or ketoprofen unless instructed by your care team. These medications may hide a fever. Be careful brushing or flossing your teeth or using a toothpick because you may get an infection or bleed more easily. If you have any dental work done, tell your dentist you are receiving this medication. This medication can make you more sensitive to cold. Do not drink cold drinks or use ice. Cover exposed skin before coming in contact with cold temperatures or cold objects. When out in cold weather wear warm clothing and cover your mouth and nose to warm the air that goes into your lungs. Tell your care team if you get sensitive to the cold. Talk to your care team if you or your partner are pregnant or think either of you might be pregnant. This medication can cause serious birth defects if taken during pregnancy and for 9 months after the last dose. A negative pregnancy test is required before starting this medication. A reliable form of contraception is recommended while taking this medication and for 9 months after the last dose. Talk to your care team about effective forms of contraception. Do not father a child while taking this medication and for 6 months after the last dose. Use a condom while having sex during this time period. Do not breastfeed while taking this medication and for 3 months after the last dose. This medication may cause infertility. Talk to your care team if you are concerned about your fertility. What side effects may I notice from receiving this medication? Side effects that you should report to your care team as soon as possible: Allergic reactions--skin rash, itching, hives, swelling of the face, lips, tongue, or throat Bleeding--bloody or black, tar-like stools, vomiting blood or brown material that looks like coffee grounds, red or dark brown urine, small red or purple spots on skin, unusual bruising or  bleeding Dry cough, shortness of breath or trouble breathing Heart rhythm changes--fast or irregular heartbeat, dizziness, feeling faint or lightheaded, chest pain, trouble breathing Infection--fever, chills, cough, sore throat, wounds that don't heal, pain or trouble when passing urine, general feeling of discomfort or being unwell Liver injury--right upper belly pain, loss of appetite, nausea, light-colored stool, dark yellow or brown urine, yellowing skin or eyes, unusual weakness or fatigue Low red blood cell level--unusual weakness or fatigue, dizziness, headache, trouble breathing Muscle injury--unusual weakness or fatigue, muscle pain, dark yellow or brown urine, decrease in amount of urine Pain, tingling, or numbness in the hands or feet Sudden and severe headache, confusion, change in vision, seizures, which may be signs of posterior reversible encephalopathy syndrome (PRES) Unusual bruising or bleeding Side effects that usually do not require medical attention (report to your care team if they continue or are bothersome): Diarrhea Nausea Pain, redness, or swelling with sores inside the mouth or throat Unusual weakness or fatigue Vomiting This list may not describe all possible side effects. Call your doctor for medical advice about side  effects. You may report side effects to FDA at 1-800-FDA-1088. Where should I keep my medication? This medication is given in a hospital or clinic. It will not be stored at home. NOTE: This sheet is a summary. It may not cover all possible information. If you have questions about this medicine, talk to your doctor, pharmacist, or health care provider.  2024 Elsevier/Gold Standard (2022-05-10 00:00:00)   Leucovorin Injection What is this medication? LEUCOVORIN (loo koe VOR in) prevents side effects from certain medications, such as methotrexate. It works by increasing folate levels. This helps protect healthy cells in your body. It may also be used  to treat anemia caused by low levels of folate. It can also be used with fluorouracil, a type of chemotherapy, to treat colorectal cancer. It works by increasing the effects of fluorouracil in the body. This medicine may be used for other purposes; ask your health care provider or pharmacist if you have questions. What should I tell my care team before I take this medication? They need to know if you have any of these conditions: Anemia from low levels of vitamin B12 in the blood An unusual or allergic reaction to leucovorin, folic acid, other medications, foods, dyes, or preservatives Pregnant or trying to get pregnant Breastfeeding How should I use this medication? This medication is injected into a vein or a muscle. It is given by your care team in a hospital or clinic setting. Talk to your care team about the use of this medication in children. Special care may be needed. Overdosage: If you think you have taken too much of this medicine contact a poison control center or emergency room at once. NOTE: This medicine is only for you. Do not share this medicine with others. What if I miss a dose? Keep appointments for follow-up doses. It is important not to miss your dose. Call your care team if you are unable to keep an appointment. What may interact with this medication? Capecitabine Fluorouracil Phenobarbital Phenytoin Primidone Trimethoprim;sulfamethoxazole This list may not describe all possible interactions. Give your health care provider a list of all the medicines, herbs, non-prescription drugs, or dietary supplements you use. Also tell them if you smoke, drink alcohol, or use illegal drugs. Some items may interact with your medicine. What should I watch for while using this medication? Your condition will be monitored carefully while you are receiving this medication. This medication may increase the side effects of 5-fluorouracil. Tell your care team if you have diarrhea or mouth  sores that do not get better or that get worse. What side effects may I notice from receiving this medication? Side effects that you should report to your care team as soon as possible: Allergic reactions--skin rash, itching, hives, swelling of the face, lips, tongue, or throat This list may not describe all possible side effects. Call your doctor for medical advice about side effects. You may report side effects to FDA at 1-800-FDA-1088. Where should I keep my medication? This medication is given in a hospital or clinic. It will not be stored at home. NOTE: This sheet is a summary. It may not cover all possible information. If you have questions about this medicine, talk to your doctor, pharmacist, or health care provider.  2024 Elsevier/Gold Standard (2021-12-28 00:00:00)   Fluorouracil Injection What is this medication? FLUOROURACIL (flure oh YOOR a sil) treats some types of cancer. It works by slowing down the growth of cancer cells. This medicine may be used for other purposes; ask  your health care provider or pharmacist if you have questions. COMMON BRAND NAME(S): Adrucil What should I tell my care team before I take this medication? They need to know if you have any of these conditions: Blood disorders Dihydropyrimidine dehydrogenase (DPD) deficiency Infection, such as chickenpox, cold sores, herpes Kidney disease Liver disease Poor nutrition Recent or ongoing radiation therapy An unusual or allergic reaction to fluorouracil, other medications, foods, dyes, or preservatives If you or your partner are pregnant or trying to get pregnant Breast-feeding How should I use this medication? This medication is injected into a vein. It is administered by your care team in a hospital or clinic setting. Talk to your care team about the use of this medication in children. Special care may be needed. Overdosage: If you think you have taken too much of this medicine contact a poison control  center or emergency room at once. NOTE: This medicine is only for you. Do not share this medicine with others. What if I miss a dose? Keep appointments for follow-up doses. It is important not to miss your dose. Call your care team if you are unable to keep an appointment. What may interact with this medication? Do not take this medication with any of the following: Live virus vaccines This medication may also interact with the following: Medications that treat or prevent blood clots, such as warfarin, enoxaparin, dalteparin This list may not describe all possible interactions. Give your health care provider a list of all the medicines, herbs, non-prescription drugs, or dietary supplements you use. Also tell them if you smoke, drink alcohol, or use illegal drugs. Some items may interact with your medicine. What should I watch for while using this medication? Your condition will be monitored carefully while you are receiving this medication. This medication may make you feel generally unwell. This is not uncommon as chemotherapy can affect healthy cells as well as cancer cells. Report any side effects. Continue your course of treatment even though you feel ill unless your care team tells you to stop. In some cases, you may be given additional medications to help with side effects. Follow all directions for their use. This medication may increase your risk of getting an infection. Call your care team for advice if you get a fever, chills, sore throat, or other symptoms of a cold or flu. Do not treat yourself. Try to avoid being around people who are sick. This medication may increase your risk to bruise or bleed. Call your care team if you notice any unusual bleeding. Be careful brushing or flossing your teeth or using a toothpick because you may get an infection or bleed more easily. If you have any dental work done, tell your dentist you are receiving this medication. Avoid taking medications that  contain aspirin, acetaminophen, ibuprofen, naproxen, or ketoprofen unless instructed by your care team. These medications may hide a fever. Do not treat diarrhea with over the counter products. Contact your care team if you have diarrhea that lasts more than 2 days or if it is severe and watery. This medication can make you more sensitive to the sun. Keep out of the sun. If you cannot avoid being in the sun, wear protective clothing and sunscreen. Do not use sun lamps, tanning beds, or tanning booths. Talk to your care team if you or your partner wish to become pregnant or think you might be pregnant. This medication can cause serious birth defects if taken during pregnancy and for 3 months after the  last dose. A reliable form of contraception is recommended while taking this medication and for 3 months after the last dose. Talk to your care team about effective forms of contraception. Do not father a child while taking this medication and for 3 months after the last dose. Use a condom while having sex during this time period. Do not breastfeed while taking this medication. This medication may cause infertility. Talk to your care team if you are concerned about your fertility. What side effects may I notice from receiving this medication? Side effects that you should report to your care team as soon as possible: Allergic reactions--skin rash, itching, hives, swelling of the face, lips, tongue, or throat Heart attack--pain or tightness in the chest, shoulders, arms, or jaw, nausea, shortness of breath, cold or clammy skin, feeling faint or lightheaded Heart failure--shortness of breath, swelling of the ankles, feet, or hands, sudden weight gain, unusual weakness or fatigue Heart rhythm changes--fast or irregular heartbeat, dizziness, feeling faint or lightheaded, chest pain, trouble breathing High ammonia level--unusual weakness or fatigue, confusion, loss of appetite, nausea, vomiting,  seizures Infection--fever, chills, cough, sore throat, wounds that don't heal, pain or trouble when passing urine, general feeling of discomfort or being unwell Low red blood cell level--unusual weakness or fatigue, dizziness, headache, trouble breathing Pain, tingling, or numbness in the hands or feet, muscle weakness, change in vision, confusion or trouble speaking, loss of balance or coordination, trouble walking, seizures Redness, swelling, and blistering of the skin over hands and feet Severe or prolonged diarrhea Unusual bruising or bleeding Side effects that usually do not require medical attention (report to your care team if they continue or are bothersome): Dry skin Headache Increased tears Nausea Pain, redness, or swelling with sores inside the mouth or throat Sensitivity to light Vomiting This list may not describe all possible side effects. Call your doctor for medical advice about side effects. You may report side effects to FDA at 1-800-FDA-1088. Where should I keep my medication? This medication is given in a hospital or clinic. It will not be stored at home. NOTE: This sheet is a summary. It may not cover all possible information. If you have questions about this medicine, talk to your doctor, pharmacist, or health care provider.  2024 Elsevier/Gold Standard (2021-11-30 00:00:00)

## 2023-09-13 NOTE — Progress Notes (Signed)
 Nutrition follow up completed with patient and his wife during infusion for Gastric cancer. He is followed by Dr. Cloretta and is receiving FOLFOX and Nivolumab .  Weight documented as 159 pounds. Patient noted to have ascites and is scheduled for paracentesis tomorrow.  Labs include sodium of 134.  Noted constipation documented Jan 29. He was told to try magnesium  citrate, sorbitol , miralax  and senna. Instructed on bowel regimen. Patient states he had a small hard stool this morning. He takes Senna and Sorbitol . Nausea improved since morphine  discontinued however, he still vomits daily and is not able to eat much. He will eat 1/4 of a baked potato or bites of other bland foods. He is not drinking enough fluids. He will attempt to drink sips of a protein drink.   Nutrition Diagnosis: Unintended wt loss cannot be evaluated due to ascites.  Severe Malnutrition related to cancer and associated treatments as evidenced by muscle loss, fat loss, and ascites.  Intervention: Provided support and encouragement to increase fluids and continue bites of bland foods as tolerated.  Educated on importance of bowel regimen. Take stool softeners as prescribed as scheduled. Provided samples of clear liquid ONS.  Monitoring, Evaluation, Goals: Patient will increase fluids and tolerate small amounts of soft foods to tolerate treatment and improve QOL.  Next Visit schedule for Wednesday, Feb 19, during infusion.

## 2023-09-13 NOTE — Progress Notes (Signed)
 [facility] OFFICE PROGRESS NOTE   Diagnosis: Gastric cancer  INTERVAL HISTORY:   [name] pleated another cycle of FOLFOX/nivolumab  08/30/2023.  He reports improvement in abdominal pain.  He has intermittent vomiting.  The vomiting now occurs more remote from eating and the frequency of vomiting has diminished.  He has constipation.  The constipation is relieved with sorbitol .  He has cold sensitivity for several days following chemotherapy.  No mouth sores, diarrhea, or nausea.  Objective:  Vital signs in last 24 hours:  Blood pressure (!) 124/93, pulse (!) 110, temperature 97.9 F (36.6 C), temperature source Temporal, resp. rate 18, height 6' 1 (1.854 m), weight 159 lb (72.1 kg), SpO2 100%.    HEENT: White coat over the tongue, no buccal thrush or ulcers Resp: Lungs clear bilaterally Cardio: Regular rate and rhythm GI: Distended and firm, no discrete mass Vascular: No leg edema  Skin: Palms without erythema  Portacath/PICC-without erythema  Lab Results:  Lab Results  Component Value Date   WBC 3.8 (L) 09/13/2023   HGB 13.0 09/13/2023   HCT 38.2 (L) 09/13/2023   MCV 89.9 09/13/2023   PLT 213 09/13/2023   NEUTROABS 2.5 09/13/2023    CMP  Lab Results  Component Value Date   NA 134 (L) 09/13/2023   K 3.8 09/13/2023   CL 98 09/13/2023   CO2 23 09/13/2023   GLUCOSE 90 09/13/2023   BUN 13 09/13/2023   CREATININE 0.89 09/13/2023   CALCIUM  9.8 09/13/2023   PROT 6.7 09/13/2023   ALBUMIN  3.4 (L) 09/13/2023   AST 14 (L) 09/13/2023   ALT 10 09/13/2023   ALKPHOS 79 09/13/2023   BILITOT 0.5 09/13/2023   GFRNONAA >60 09/13/2023   GFRAA >60 07/11/2019    Lab Results  Component Value Date   CEA 1.31 08/11/2023     Medications: I have reviewed the patient's current medications.   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  History of recurrent venous thrombosis-maintained on apixaban  anticoagulation 4.  Tobacco and alcohol use 5.  Family history of multiple cancers 6.  Anorexia/weight loss and constipation secondary to #1 7.  Left hip replacement June 2024     Disposition: Mr Anthony has completed 2 cycles of FOLFOX/nivolumab .  He has tolerated the treatment well.  His overall performance status is partially improved.  Encouraged him to continue nutrition supplements as tolerated.  He will complete cycle 3 today.  The abdomen is markedly distended.  He will be referred for a therapeutic paracentesis.  [name] will continue senna and sorbitol  for constipation.  He plans to resume a nicotine  patch.  He will return for an office visit and chemotherapy in 2 weeks.  He will be referred for a restaging CT evaluation after cycle 5 FOLFOX/nivolumab .  Arley Hof, MD  09/13/2023  9:03 AM

## 2023-09-13 NOTE — Addendum Note (Signed)
 Addended by: Elsa Halls on: 09/13/2023 10:29 AM   Modules accepted: Orders

## 2023-09-13 NOTE — Progress Notes (Signed)
 Patient seen by Dr. Arley Hof today  Vitals are within treatment parameters:No (Please specify and give further instructions.) OK to proceed w/BP 124/93 and pulse 110  Labs are within treatment parameters: Yes   Treatment plan has been signed: Yes   Per physician team, Patient is ready for treatment and there are NO modifications to the treatment plan.

## 2023-09-14 ENCOUNTER — Encounter: Payer: Self-pay | Admitting: Oncology

## 2023-09-14 ENCOUNTER — Ambulatory Visit (HOSPITAL_COMMUNITY)
Admission: RE | Admit: 2023-09-14 | Discharge: 2023-09-14 | Disposition: A | Discharge status: Home / Self Care | Payer: Medicaid Other | Source: Ambulatory Visit | Attending: Oncology | Admitting: Oncology

## 2023-09-14 DIAGNOSIS — R140 Abdominal distension (gaseous): Secondary | ICD-10-CM | POA: Diagnosis not present

## 2023-09-14 DIAGNOSIS — C169 Malignant neoplasm of stomach, unspecified: Secondary | ICD-10-CM | POA: Diagnosis present

## 2023-09-14 DIAGNOSIS — C163 Malignant neoplasm of pyloric antrum: Secondary | ICD-10-CM

## 2023-09-14 MED ORDER — LIDOCAINE HCL 1 % IJ SOLN
INTRAMUSCULAR | Status: AC
  Filled 2023-09-14: qty 20

## 2023-09-14 NOTE — Procedures (Signed)
 PROCEDURE SUMMARY:  Successful image-guided paracentesis from the right lower abdomen.  Max amount of fluid to be removed 4 L per ordering provider.  Yielded 4 liters of hazy yellow fluid.  No immediate complications.  EBL = trace. Patient tolerated well.   Specimen was not sent for labs.  Please see imaging section of Epic for full dictation.   Kashaun Bebo H Avyay Coger PA-C 09/14/2023 4:14 PM

## 2023-09-15 ENCOUNTER — Inpatient Hospital Stay: Payer: Medicaid Other

## 2023-09-15 VITALS — BP 113/86 | HR 94 | Temp 98.5°F | Resp 18 | Wt 154.3 lb

## 2023-09-15 DIAGNOSIS — C163 Malignant neoplasm of pyloric antrum: Secondary | ICD-10-CM

## 2023-09-15 DIAGNOSIS — Z5111 Encounter for antineoplastic chemotherapy: Secondary | ICD-10-CM | POA: Diagnosis not present

## 2023-09-15 MED ORDER — HEPARIN SOD (PORK) LOCK FLUSH 100 UNIT/ML IV SOLN
500.0000 [IU] | Freq: Once | INTRAVENOUS | Status: AC | PRN
  Administered 2023-09-15: 500 [IU]

## 2023-09-15 MED ORDER — SODIUM CHLORIDE 0.9% FLUSH
10.0000 mL | INTRAVENOUS | Status: DC | PRN
  Administered 2023-09-15: 10 mL

## 2023-09-15 NOTE — Patient Instructions (Signed)

## 2023-09-16 DIAGNOSIS — C169 Malignant neoplasm of stomach, unspecified: Secondary | ICD-10-CM | POA: Diagnosis not present

## 2023-09-17 ENCOUNTER — Other Ambulatory Visit: Payer: Self-pay | Admitting: Oncology

## 2023-09-18 ENCOUNTER — Other Ambulatory Visit: Payer: Self-pay | Admitting: Nurse Practitioner

## 2023-09-18 ENCOUNTER — Encounter: Payer: Self-pay | Admitting: Oncology

## 2023-09-18 ENCOUNTER — Telehealth: Payer: Self-pay | Admitting: *Deleted

## 2023-09-18 DIAGNOSIS — C163 Malignant neoplasm of pyloric antrum: Secondary | ICD-10-CM

## 2023-09-18 MED ORDER — HYDROMORPHONE HCL 4 MG PO TABS: 4.0000 mg | ORAL_TABLET | ORAL | 0 refills | Status: DC | PRN

## 2023-09-18 NOTE — Telephone Encounter (Signed)
 Mrs. Nearhood called to report he needs refill on both antiemetics and pain med. Confirmed pharmacy.

## 2023-09-19 ENCOUNTER — Telehealth: Payer: Self-pay

## 2023-09-19 ENCOUNTER — Other Ambulatory Visit: Payer: Self-pay

## 2023-09-19 ENCOUNTER — Inpatient Hospital Stay: Payer: Medicaid Other

## 2023-09-19 NOTE — Telephone Encounter (Addendum)
The patient's wife contacted the after-hours service to report that he has been experiencing vomiting for the past 3 to 4 days, without any blood present. Upon speaking with the patient directly, he indicated that he does not feel nauseous, but when attempting to eat crackers, they become lodged in his chest, which triggers the vomiting. He mentioned that the vomiting has worsened over the last few days and that it has a burning sensation when it returns. The patient is uncertain about what steps to take next. I recommend that the patient adhere to a liquid diet and avoid peanut butter crackers. Additionally, I suggest trying Mylanta and Maalox for relief.

## 2023-09-19 NOTE — Telephone Encounter (Signed)
[facility] CSW Progress Note  Clinical Child psychotherapist contacted patient by phone as schedule for Goals of Care conversation. Patient not feeling well at time of call, appointment rescheduled.   Marguerita Merles, LCSW Clinical Social Worker [facility]

## 2023-09-20 ENCOUNTER — Encounter: Payer: Self-pay | Admitting: Oncology

## 2023-09-23 ENCOUNTER — Other Ambulatory Visit: Payer: Self-pay | Admitting: Oncology

## 2023-09-26 ENCOUNTER — Inpatient Hospital Stay: Payer: Medicaid Other

## 2023-09-26 NOTE — Progress Notes (Signed)
[facility] CSW Progress Note  Visual merchandiser met with patient and spouse via telephone for a goals of care conversation. Patient is currently under the care of Dr. Myrle Sheng.   CSW provided education on goals of care including purpose and how they are used. CSW provided education on Advance Directives and how patient and spouse can access one if needed. CSW informed patient, no knowledge is known by CSW of any changes to treatment plan, referral for Goals of Care is based on stage and being in active treatment. Patient verbalized understanding and was okay with proceeding with conversation.    Goals of Care Tell us about your current understanding of your illness: Patient reported he is aware his treatment is palliative and not curative intent. Patient stated he is "not sure where he stands" with treatment, doesn't know if it's working and why labs are needed each time. Patient expressed a desire to be kept informed about the purpose of each step, so that he is aware. Patient reported confidence in his medical team. CSW provided education on the general purpose of how blood work is used to guide treatment and the purpose of scans after a set amount of treatment.   What are your short and long-term goals? Are there any future special events or milestones that are particularly important to you?: Patient and his spouse reported their goals of ensuring that he is able to spend time with his family and attempt to regain pre-diagnosis activities such as lawn work and spending time outside. Patient stated the diagnosis, has inspired him to appreciate life better and wanting to continue to live life. Patient and family have no present deciding factor on what would lead to discontinuing treatment. Patient and family are comfortable with addressing this, when needed. However, the conversation has come up between them and they feel comfortable discussing this together.   Documents: CSW explained health care  documents such as Advance Directives and MOST forms.  At this time, patient declines to complete documents. Should patient and family chang their minds,  AD can be completed with CSW and MOST forms can be completed with their provider.    Marguerita Merles, LCSW Clinical Social Worker [facility]  [phone]

## 2023-09-26 NOTE — Progress Notes (Unsigned)
Patient Care Team: Wanda Plump, MD as PCP - Jerelene Redden, MD as PCP - Cardiology (Cardiology) Axel Filler, MD as Consulting Physician (General Surgery) Wyline Mood, RN as VBCI Care Management   CHIEF COMPLAINT: Follow up gastric cancer   Oncology History  Gastric cancer [facility])  08/03/2023 Initial Diagnosis   Gastric cancer (HCC)   08/03/2023 Cancer Staging   Staging form: Stomach, AJCC 8th Edition - Clinical: Stage IVB (cTX, cNX, pM1) - Signed by Ladene Artist, MD on 08/03/2023 Histologic grade (G): G3 Histologic grading system: 3 grade system   08/16/2023 -  Chemotherapy   Patient is on Treatment Plan : GASTROESOPHAGEAL FOLFOX q14d x 6 cycles        CURRENT THERAPY: FOLFOX/Nivo q2 weeks  INTERVAL HISTORY [name] returns for follow up as scheduled, last seen by Dr. Truett Perna 09/13/23 with C3 FOLFOX/Nivo   ROS   Past Medical History:  Diagnosis Date   Chest pain    stres test neg x 2, cath 5/12; minimal LAD irregs; no obs CAD; normal LVF   Clotting disorder (HCC)    dvt   Deep vein thrombosis (HCC) 10/06/2010   GERD (gastroesophageal reflux disease)    History of DVT of lower extremity    on chronic coumadin   Hypertension    Internal hemorrhoids    Cscope 2007   PAD (peripheral artery disease) (HCC)    a. left leg ischemia tx wtih embolectomy of left fem, pop, tib arteries with Dr. Edilia Bo - 08/2006;  b. known occlusion of right pop with collats - med Rx;  c.ABI's 8/09: R 1.0; L 0.91    PFO (patent foramen ovale)    per 08/2006 discharge summary, TEE showed EF 60%, small PFO with minimal right-to-left shunt with Valsalva; not felt to be the source of emboli, lifelong Coumadin recommended   Pneumonia    history of   Polysubstance abuse (HCC)    marijuana only   Renal infarct [facility])    R     Past Surgical History:  Procedure Laterality Date   BIOPSY  07/18/2023   Procedure: BIOPSY;  Surgeon: Sherrilyn Rist, MD;  Location: [facility]  ENDOSCOPY;  Service: Gastroenterology;;   CHOLECYSTECTOMY     COLONOSCOPY     ESOPHAGOGASTRODUODENOSCOPY (EGD) WITH PROPOFOL N/A 07/18/2023   Procedure: ESOPHAGOGASTRODUODENOSCOPY (EGD) WITH PROPOFOL;  Surgeon: Sherrilyn Rist, MD;  Location: [facility] ENDOSCOPY;  Service: Gastroenterology;  Laterality: N/A;   INGUINAL HERNIA REPAIR Left 07/16/2019   Procedure: LAPAROSCOPIC LEFT INGUINAL HERNIA REPAIR WITH MESH;  Surgeon: Axel Filler, MD;  Location: [facility] OR;  Service: General;  Laterality: Left;   IR IMAGING GUIDED PORT INSERTION  08/08/2023   IR PARACENTESIS  07/17/2023   left leg blood clot removal 2009  2009   TONSILLECTOMY     TOTAL HIP ARTHROPLASTY Left 02/03/2023   Procedure: LEFT TOTAL HIP ARTHROPLASTY ANTERIOR APPROACH;  Surgeon: Tarry Kos, MD;  Location: [facility] OR;  Service: Orthopedics;  Laterality: Left;  3-C   WRIST SURGERY Left      Outpatient Encounter Medications as of 09/27/2023  Medication Sig Note   apixaban (ELIQUIS) 5 MG TABS tablet Take 1 tablet by mouth twice daily    EPINEPHrine (EPIPEN 2-PAK) 0.3 mg/0.3 mL IJ SOAJ injection Inject 0.3 mg into the muscle as needed for anaphylaxis. (Patient not taking: Reported on 08/30/2023) 07/21/2023: 12/13 - As needed   HYDROmorphone (DILAUDID) 4 MG tablet Take 1-2 tablets (4-8 mg total)  by mouth every 4 (four) hours as needed for severe pain (pain score 7-10).    lidocaine-prilocaine (EMLA) cream Apply 1 Application topically as needed (Apply to Port 1-2 hours prior to its use).    nicotine (NICODERM CQ - DOSED IN MG/24 HOURS) 14 mg/24hr patch Place 1 patch (14 mg total) onto the skin daily.    ondansetron (ZOFRAN) 8 MG tablet TAKE 1 TABLET BY MOUTH EVERY 8 HOURS AS NEEDED (START  3  DAYS  AFTER  CHEMO  AS  NEEDED  FOR  NAUSEA/VOMITING).    pantoprazole (PROTONIX) 40 MG tablet Take 1 tablet (40 mg total) by mouth 2 (two) times daily.    polyethylene glycol (MIRALAX / GLYCOLAX) 17 g packet Take 17 g by mouth daily as needed for mild  constipation.    potassium chloride (KLOR-CON M) 10 MEQ tablet Take 1 tablet (10 mEq total) by mouth 2 (two) times daily.    prochlorperazine (COMPAZINE) 10 MG tablet TAKE 1 TABLET BY MOUTH EVERY 6 HOURS AS NEEDED FOR NAUSEA FOR VOMITING    sennosides-docusate sodium (SENOKOT-S) 8.6-50 MG tablet Take 2 tablets by mouth 2 (two) times daily.    sorbitol 70 % SOLN Take 15 ml by mouth every 6 hours until BM occurs as directed by physician.  Then take 15 ml daily as needed for constipation.    sucralfate (CARAFATE) 1 g tablet Take 1 tablet (1 g total) by mouth 4 (four) times daily -  with meals and at bedtime. Dissolve tablet in water to make liquid    No facility-administered encounter medications on file as of 09/27/2023.     There were no vitals filed for this visit. There is no height or weight on file to calculate BMI.   PHYSICAL EXAM GENERAL:alert, no distress and comfortable SKIN: no rash  EYES: sclera clear NECK: without mass LYMPH:  no palpable cervical or supraclavicular lymphadenopathy  LUNGS: clear with normal breathing effort HEART: regular rate & rhythm, no lower extremity edema ABDOMEN: abdomen soft, non-tender and normal bowel sounds NEURO: alert & oriented x 3 with fluent speech, no focal motor/sensory deficits Breast exam:  PAC without erythema    CBC    Component Value Date/Time   WBC 3.8 (L) 09/13/2023 0743   WBC 7.3 07/19/2023 1156   RBC 4.25 09/13/2023 0743   HGB 13.0 09/13/2023 0743   HGB 14.5 04/27/2021 0812   HCT 38.2 (L) 09/13/2023 0743   HCT 43.5 04/27/2021 0812   PLT 213 09/13/2023 0743   PLT 225 04/27/2021 0812   MCV 89.9 09/13/2023 0743   MCV 96 04/27/2021 0812   MCH 30.6 09/13/2023 0743   MCHC 34.0 09/13/2023 0743   RDW 13.4 09/13/2023 0743   RDW 13.7 04/27/2021 0812   LYMPHSABS 0.9 09/13/2023 0743   MONOABS 0.3 09/13/2023 0743   EOSABS 0.0 09/13/2023 0743   BASOSABS 0.0 09/13/2023 0743     CMP     Component Value Date/Time   NA 134 (L)  09/13/2023 0743   NA 139 05/24/2022 0926   K 3.8 09/13/2023 0743   CL 98 09/13/2023 0743   CO2 23 09/13/2023 0743   GLUCOSE 90 09/13/2023 0743   BUN 13 09/13/2023 0743   BUN 11 05/24/2022 0926   CREATININE 0.89 09/13/2023 0743   CALCIUM 9.8 09/13/2023 0743   PROT 6.7 09/13/2023 0743   PROT 6.8 08/30/2022 1006   ALBUMIN 3.4 (L) 09/13/2023 0743   ALBUMIN 4.5 08/30/2022 1006   AST 14 (L)  09/13/2023 0743   ALT 10 09/13/2023 0743   ALKPHOS 79 09/13/2023 0743   BILITOT 0.5 09/13/2023 0743   GFRNONAA >60 09/13/2023 0743   GFRAA >60 07/11/2019 0920     ASSESSMENT & PLAN:  Gastric cancer, stage IV CT abdomen/pelvis 07/16/2023-increased wall thickening at the gastric antrum and pylorus compared to 2016, small to moderate ascites, subcapsular hypodensities at the inferior liver-subcapsular implants?,  Diffuse soft tissue infiltration and haziness throughout the omentum and upper abdominal peritoneal fat Endoscopy 07/18/2023-congested mucosa in the gastric antrum and biopsy biopsied: 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 Paracentesis 07/17/2023-acute inflammation, no malignant cells CT biopsy of right upper quadrant omental thickening 07/19/2023: Metastatic total differentiated carcinoma consistent with primary gastric carcinoma, normal mismatch repair protein expression , HER2 (0); foundation 1-microsatellite stable, tumor mutation burden 2, PD-L1 CPS 1; [place] PD-L1 CPS 60% Cycle 1 FOLFOX/nivolumab 08/16/2023 Cycle 2 FOLFOX/nivolumab 08/30/2023 Cycle 3 FOLFOX/nivolumab 09/13/2023     Pain secondary to #1  History of recurrent venous thrombosis-maintained on apixaban anticoagulation  Tobacco and alcohol use Family history of multiple cancers  Anorexia/weight loss and constipation secondary to #1 Left hip replacement June 2024         PLAN:  No orders of the defined types were placed in this encounter.     All questions were answered. The patient knows  to call the clinic with any problems, questions or concerns. No barriers to learning were detected. I spent *** counseling the patient face to face. The total time spent in the appointment was *** and more than 50% was on counseling, review of test results, and coordination of care.   Anthony Glad, NP-C @DATE @

## 2023-09-27 ENCOUNTER — Ambulatory Visit: Payer: 59 | Admitting: Nurse Practitioner

## 2023-09-27 ENCOUNTER — Inpatient Hospital Stay: Payer: Medicaid Other

## 2023-09-27 ENCOUNTER — Other Ambulatory Visit: Payer: Self-pay

## 2023-09-27 ENCOUNTER — Encounter (HOSPITAL_COMMUNITY): Payer: Self-pay

## 2023-09-27 ENCOUNTER — Observation Stay (HOSPITAL_COMMUNITY)
Admission: AD | Admit: 2023-09-27 | Discharge: 2023-09-29 | Disposition: A | Discharge status: Home / Self Care | Payer: Medicaid Other | Source: Ambulatory Visit | Attending: Internal Medicine | Admitting: Internal Medicine

## 2023-09-27 ENCOUNTER — Ambulatory Visit: Payer: 59

## 2023-09-27 ENCOUNTER — Encounter (HOSPITAL_COMMUNITY): Payer: Self-pay | Admitting: Internal Medicine

## 2023-09-27 ENCOUNTER — Other Ambulatory Visit: Payer: 59

## 2023-09-27 ENCOUNTER — Ambulatory Visit (HOSPITAL_COMMUNITY): Payer: 59

## 2023-09-27 ENCOUNTER — Inpatient Hospital Stay (HOSPITAL_BASED_OUTPATIENT_CLINIC_OR_DEPARTMENT_OTHER): Payer: Medicaid Other | Admitting: Nurse Practitioner

## 2023-09-27 ENCOUNTER — Inpatient Hospital Stay: Payer: Medicaid Other | Admitting: Nutrition

## 2023-09-27 VITALS — BP 122/104 | HR 98 | Temp 98.2°F | Resp 18 | Ht 73.0 in | Wt 147.0 lb

## 2023-09-27 DIAGNOSIS — F17210 Nicotine dependence, cigarettes, uncomplicated: Secondary | ICD-10-CM | POA: Insufficient documentation

## 2023-09-27 DIAGNOSIS — Z96642 Presence of left artificial hip joint: Secondary | ICD-10-CM | POA: Diagnosis not present

## 2023-09-27 DIAGNOSIS — C163 Malignant neoplasm of pyloric antrum: Principal | ICD-10-CM

## 2023-09-27 DIAGNOSIS — Z86718 Personal history of other venous thrombosis and embolism: Secondary | ICD-10-CM | POA: Insufficient documentation

## 2023-09-27 DIAGNOSIS — I10 Essential (primary) hypertension: Secondary | ICD-10-CM | POA: Insufficient documentation

## 2023-09-27 DIAGNOSIS — K219 Gastro-esophageal reflux disease without esophagitis: Secondary | ICD-10-CM | POA: Diagnosis not present

## 2023-09-27 DIAGNOSIS — R188 Other ascites: Secondary | ICD-10-CM | POA: Diagnosis not present

## 2023-09-27 DIAGNOSIS — E785 Hyperlipidemia, unspecified: Secondary | ICD-10-CM | POA: Insufficient documentation

## 2023-09-27 DIAGNOSIS — R112 Nausea with vomiting, unspecified: Secondary | ICD-10-CM | POA: Diagnosis not present

## 2023-09-27 DIAGNOSIS — E876 Hypokalemia: Secondary | ICD-10-CM | POA: Diagnosis not present

## 2023-09-27 DIAGNOSIS — E871 Hypo-osmolality and hyponatremia: Secondary | ICD-10-CM | POA: Diagnosis not present

## 2023-09-27 DIAGNOSIS — C169 Malignant neoplasm of stomach, unspecified: Secondary | ICD-10-CM | POA: Diagnosis present

## 2023-09-27 DIAGNOSIS — R180 Malignant ascites: Secondary | ICD-10-CM | POA: Diagnosis present

## 2023-09-27 LAB — CBC WITH DIFFERENTIAL (CANCER CENTER ONLY)
Abs Immature Granulocytes: 0 10*3/uL (ref 0.00–0.07)
Basophils Absolute: 0 10*3/uL (ref 0.0–0.1)
Basophils Relative: 0 %
Eosinophils Absolute: 0 10*3/uL (ref 0.0–0.5)
Eosinophils Relative: 1 %
HCT: 39.2 % (ref 39.0–52.0)
Hemoglobin: 13.2 g/dL (ref 13.0–17.0)
Immature Granulocytes: 0 %
Lymphocytes Relative: 51 %
Lymphs Abs: 1.5 10*3/uL (ref 0.7–4.0)
MCH: 29.5 pg (ref 26.0–34.0)
MCHC: 33.7 g/dL (ref 30.0–36.0)
MCV: 87.7 fL (ref 80.0–100.0)
Monocytes Absolute: 0.4 10*3/uL (ref 0.1–1.0)
Monocytes Relative: 14 %
Neutro Abs: 1 10*3/uL — ABNORMAL LOW (ref 1.7–7.7)
Neutrophils Relative %: 34 %
Platelet Count: 234 10*3/uL (ref 150–400)
RBC: 4.47 MIL/uL (ref 4.22–5.81)
RDW: 13.8 % (ref 11.5–15.5)
WBC Count: 2.9 10*3/uL — ABNORMAL LOW (ref 4.0–10.5)
nRBC: 0 % (ref 0.0–0.2)

## 2023-09-27 LAB — CMP (CANCER CENTER ONLY)
ALT: 22 U/L (ref 0–44)
AST: 16 U/L (ref 15–41)
Albumin: 3.3 g/dL — ABNORMAL LOW (ref 3.5–5.0)
Alkaline Phosphatase: 116 U/L (ref 38–126)
Anion gap: 10 (ref 5–15)
BUN: 11 mg/dL (ref 6–20)
CO2: 30 mmol/L (ref 22–32)
Calcium: 9.7 mg/dL (ref 8.9–10.3)
Chloride: 94 mmol/L — ABNORMAL LOW (ref 98–111)
Creatinine: 0.96 mg/dL (ref 0.61–1.24)
GFR, Estimated: >60 mL/min (ref >60)
Glucose, Bld: 98 mg/dL (ref 70–99)
Potassium: 3.1 mmol/L — ABNORMAL LOW (ref 3.5–5.1)
Sodium: 134 mmol/L — ABNORMAL LOW (ref 135–145)
Total Bilirubin: 0.6 mg/dL (ref 0.0–1.2)
Total Protein: 6.8 g/dL (ref 6.5–8.1)

## 2023-09-27 MED ORDER — POTASSIUM CHLORIDE IN NACL 20-0.9 MEQ/L-% IV SOLN
INTRAVENOUS | Status: DC
  Filled 2023-09-27: qty 1000

## 2023-09-27 MED ORDER — SODIUM CHLORIDE 0.9 % IV SOLN: Freq: Once | INTRAVENOUS | Status: DC

## 2023-09-27 MED ORDER — POTASSIUM CHLORIDE 10 MEQ/100ML IV SOLN
10.0000 meq | INTRAVENOUS | Status: AC
  Administered 2023-09-27 (×6): 10 meq via INTRAVENOUS
  Filled 2023-09-27 (×4): qty 100

## 2023-09-27 MED ORDER — ONDANSETRON HCL 4 MG PO TABS
4.0000 mg | ORAL_TABLET | Freq: Four times a day (QID) | ORAL | Status: DC | PRN
Start: 2023-09-27 — End: 2023-09-29

## 2023-09-27 MED ORDER — PROCHLORPERAZINE EDISYLATE 10 MG/2ML IJ SOLN: 5.0000 mg | INTRAMUSCULAR | Status: DC | PRN

## 2023-09-27 MED ORDER — ONDANSETRON HCL 4 MG/2ML IJ SOLN
4.0000 mg | Freq: Four times a day (QID) | INTRAMUSCULAR | Status: DC | PRN
Start: 2023-09-27 — End: 2023-09-29

## 2023-09-27 MED ORDER — NICOTINE 14 MG/24HR TD PT24
14.0000 mg | MEDICATED_PATCH | Freq: Every day | TRANSDERMAL | Status: DC
  Administered 2023-09-28 – 2023-09-29 (×2): 14 mg via TRANSDERMAL
  Filled 2023-09-27 (×3): qty 1

## 2023-09-27 MED ORDER — POTASSIUM CHLORIDE 10 MEQ/100ML IV SOLN
10.0000 meq | INTRAVENOUS | Status: DC
  Filled 2023-09-27: qty 100

## 2023-09-27 MED ORDER — ACETAMINOPHEN 325 MG PO TABS: 650.0000 mg | ORAL_TABLET | Freq: Four times a day (QID) | ORAL | Status: DC | PRN

## 2023-09-27 MED ORDER — MORPHINE SULFATE (PF) 2 MG/ML IV SOLN
2.0000 mg | Freq: Once | INTRAVENOUS | Status: AC
  Administered 2023-09-27: 2 mg via INTRAVENOUS
  Filled 2023-09-27: qty 1

## 2023-09-27 MED ORDER — DEXAMETHASONE SODIUM PHOSPHATE 10 MG/ML IJ SOLN
10.0000 mg | Freq: Once | INTRAMUSCULAR | Status: AC
  Administered 2023-09-27: 10 mg via INTRAVENOUS
  Filled 2023-09-27: qty 1

## 2023-09-27 MED ORDER — ACETAMINOPHEN 650 MG RE SUPP: 650.0000 mg | Freq: Four times a day (QID) | RECTAL | Status: DC | PRN

## 2023-09-27 MED ORDER — DEXTROSE-SODIUM CHLORIDE 5-0.9 % IV SOLN: INTRAVENOUS | Status: DC

## 2023-09-27 MED ORDER — FAMOTIDINE IN NACL 20-0.9 MG/50ML-% IV SOLN
20.0000 mg | Freq: Once | INTRAVENOUS | Status: AC
  Administered 2023-09-27: 20 mg via INTRAVENOUS
  Filled 2023-09-27: qty 50

## 2023-09-27 MED ORDER — SODIUM CHLORIDE 0.9 % IV SOLN: INTRAVENOUS | Status: DC

## 2023-09-27 MED ORDER — PANTOPRAZOLE SODIUM 40 MG IV SOLR
40.0000 mg | Freq: Once | INTRAVENOUS | Status: AC
  Administered 2023-09-27: 40 mg via INTRAVENOUS
  Filled 2023-09-27: qty 10

## 2023-09-27 MED ORDER — HYDROMORPHONE HCL 1 MG/ML IJ SOLN
1.0000 mg | INTRAMUSCULAR | Status: DC | PRN
  Administered 2023-09-27 – 2023-09-29 (×6): 1 mg via INTRAVENOUS
  Filled 2023-09-27 (×7): qty 1

## 2023-09-27 NOTE — Progress Notes (Signed)
63 yo male diagnosed with Gastric cancer and followed by Dr. Truett Perna. He is on FOLFOX and Nivolumab.  Nutrition follow up completed with patient and his wife. Patient lying in recliner with eyes closed. Feels weak and tired.  Weight decreased to 147 pounds today from 159 pounds Feb 5. Patient S/P paracentesis on Feb 6 with 4 L removed.  Labs include Sodium 134, Potassium 3.1, Albumin 3.3, WBC 2.9, ANC 1.0  Patient will not be receiving treatment today.   Per provider infuse IV 1 liter Fluids, Potassium 20 mEq, Decadron 10 mg, orders placed and needs to eat/ drink in infusion- if vomiting occurs make Lacie NP aware. She is recommending patient to be admitted.  Patient continues to have sporadic and frequent vomiting. He vomited once today in the exam room. His bowels are moving. He is not able to eat or drink adequate fluids. He has lost ~14% body weight over 2 months and 8% over ~3 weeks; however, had 4 L removed right after last weight.  Patient noted to have fat and muscle loss but did not do NFPE due to patient fatigue and vomiting. He is likely severely malnourished. He is having taste fatigue with ONS and has been refusing to eat. His wife offers a variety of foods and liquids.   Nutrition Diagnosis: Unintended wt loss continues. Likely severely malnourished due to % wt loss, ascites, and fat and muscle loss.  Intervention: Provided support to patient and wife. Provided a trial of saltine cracker and sprite. NP requesting notification of any vomiting after oral intake. So far, patient has refused. Consider TF/TPN based on plan of care and bowel function.  Monitoring, Evaluation, Goals: Tolerate increased calories and fluids to improve QOL.  Will follow based on need/POC.

## 2023-09-27 NOTE — Progress Notes (Signed)
Patient seen by Santiago Glad, NP today  Vitals are within treatment parameters:No (Please specify and give further instructions.) BP: 122/104, decreased 12 pounds Okay to proceed Labs are within treatment parameters: No (Please specify and give further instructions.)  ANC: 1.0, Potassium: 3.1 Okay to proceed Treatment plan has been signed: Yes   Per physician team, Patient will not be receiving treatment today.  Per provider infuse IV 1 liter Fluids, Potassium 20 mEq, Decadron 10 mg, 09/27/23, orders placed  Per provider patient needs to eat/ drink in infusion- if vomiting occurs make Lacie NP aware

## 2023-09-27 NOTE — H&P (Signed)
History and Physical    Patient: [name]:[id number] DOB: November 03, 1960 DOA: 09/27/2023 DOS: the patient was seen and examined on 09/27/2023 PCP: Wanda Plump, MD  Patient coming from: Home  Chief Complaint: Abdominal pain, intractable nausea and vomiting.  HPI: [name] is a 63 y.o. male with medical history significant of chest pain, history of DVT, GERD, hypertension, internal hemorrhoids, PAD, patent foramen ovale, history of pneumonia, polysubstance abuse, renal infarct who was referred by oncology from the [facility] for IV hydration due to abdominal pain with intractable nausea and vomiting in the setting of gastric cancer. On some days he is tolerating fluids. No fever, positive chills. He denied rhinorrhea, sore throat, wheezing or hemoptysis. No chest pain, palpitations, diaphoresis, PND, orthopnea or pitting edema of the lower extremities. No diarrhea, constipation, melena or hematochezia. No flank pain, dysuria, frequency or hematuria. No polyuria, polydipsia, polyphagia or blurred vision.   Labs this morning: CBC showed white count 2.9 with 84% neutrophils, 51% lymphocytes and 14% monocytes.  Hemoglobin was 13.2 g/dL platelets 865.  CMP showed an albumin of 3.3 g/dL, sodium 784, potassium 3.1 and chloride 94 mmol/L, the rest of the CMP measurements are normal.   Review of Systems: As mentioned in the history of present illness. All other systems reviewed and are negative.  Past Medical History:  Diagnosis Date   Chest pain    stres test neg x 2, cath 5/12; minimal LAD irregs; no obs CAD; normal LVF   Clotting disorder (HCC)    dvt   Deep vein thrombosis (HCC) 10/06/2010   GERD (gastroesophageal reflux disease)    History of DVT of lower extremity    on chronic coumadin   Hypertension    Internal hemorrhoids    Cscope 2007   PAD (peripheral artery disease) (HCC)    a. left leg ischemia tx wtih embolectomy of left fem, pop, tib arteries with  Dr. Edilia Bo - 08/2006;  b. known occlusion of right pop with collats - med Rx;  c.ABI's 8/09: R 1.0; L 0.91    PFO (patent foramen ovale)    per 08/2006 discharge summary, TEE showed EF 60%, small PFO with minimal right-to-left shunt with Valsalva; not felt to be the source of emboli, lifelong Coumadin recommended   Pneumonia    history of   Polysubstance abuse (HCC)    marijuana only   Renal infarct [facility])    R   Past Surgical History:  Procedure Laterality Date   BIOPSY  07/18/2023   Procedure: BIOPSY;  Surgeon: Sherrilyn Rist, MD;  Location: [facility] ENDOSCOPY;  Service: Gastroenterology;;   CHOLECYSTECTOMY     COLONOSCOPY     ESOPHAGOGASTRODUODENOSCOPY (EGD) WITH PROPOFOL N/A 07/18/2023   Procedure: ESOPHAGOGASTRODUODENOSCOPY (EGD) WITH PROPOFOL;  Surgeon: Sherrilyn Rist, MD;  Location: [facility] ENDOSCOPY;  Service: Gastroenterology;  Laterality: N/A;   INGUINAL HERNIA REPAIR Left 07/16/2019   Procedure: LAPAROSCOPIC LEFT INGUINAL HERNIA REPAIR WITH MESH;  Surgeon: Axel Filler, MD;  Location: [facility] OR;  Service: General;  Laterality: Left;   IR IMAGING GUIDED PORT INSERTION  08/08/2023   IR PARACENTESIS  07/17/2023   left leg blood clot removal 2009  2009   TONSILLECTOMY     TOTAL HIP ARTHROPLASTY Left 02/03/2023   Procedure: LEFT TOTAL HIP ARTHROPLASTY ANTERIOR APPROACH;  Surgeon: Tarry Kos, MD;  Location: [facility] OR;  Service: Orthopedics;  Laterality: Left;  3-C   WRIST SURGERY Left    Social History:  reports that he has been smoking cigarettes. He has a 30 pack-year smoking history. He has never used smokeless tobacco. He reports current alcohol use of about 21.0 standard drinks of alcohol per week. He reports current drug use. Drug: Marijuana.  Allergies  Allergen Reactions   Pravastatin Itching   Simvastatin Itching    Family History  Problem Relation Age of Onset   Hypertension Mother    Breast cancer Mother    Hypertension Father    CAD Father    Diabetes Father     Lung cancer Maternal Uncle    Pancreatic cancer Brother    Prostate cancer Maternal Uncle    Heart disease Paternal Grandmother    Colon cancer Neg Hx    Stomach cancer Neg Hx    Esophageal cancer Neg Hx    Rectal cancer Neg Hx     Prior to Admission medications   Medication Sig Start Date End Date Taking? Authorizing Provider  apixaban (ELIQUIS) 5 MG TABS tablet Take 1 tablet by mouth twice daily 04/21/23   [name], MD  EPINEPHrine (EPIPEN 2-PAK) 0.3 mg/0.3 mL IJ SOAJ injection Inject 0.3 mg into the muscle as needed for anaphylaxis. Patient not taking: Reported on 08/30/2023 12/19/22   Wanda Plump, MD  HYDROmorphone (DILAUDID) 4 MG tablet Take 1-2 tablets (4-8 mg total) by mouth every 4 (four) hours as needed for severe pain (pain score 7-10). 09/18/23   Rana Snare, NP  lidocaine-prilocaine (EMLA) cream Apply 1 Application topically as needed (Apply to Port 1-2 hours prior to its use). 08/11/23   Ladene Artist, MD  nicotine (NICODERM CQ - DOSED IN MG/24 HOURS) 14 mg/24hr patch Place 1 patch (14 mg total) onto the skin daily. Patient not taking: Reported on 09/27/2023 09/13/23   Ladene Artist, MD  ondansetron (ZOFRAN) 8 MG tablet TAKE 1 TABLET BY MOUTH EVERY 8 HOURS AS NEEDED (START  3  DAYS  AFTER  CHEMO  AS  NEEDED  FOR  NAUSEA/VOMITING). 09/18/23   Ladene Artist, MD  pantoprazole (PROTONIX) 40 MG tablet Take 1 tablet (40 mg total) by mouth 2 (two) times daily. 08/22/23   Rana Snare, NP  polyethylene glycol (MIRALAX / GLYCOLAX) 17 g packet Take 17 g by mouth daily as needed for mild constipation. Patient not taking: Reported on 09/27/2023 07/20/23   Dorcas Carrow, MD  potassium chloride (KLOR-CON M) 10 MEQ tablet Take 1 tablet (10 mEq total) by mouth 2 (two) times daily. 08/16/23   Rana Snare, NP  prochlorperazine (COMPAZINE) 10 MG tablet TAKE 1 TABLET BY MOUTH EVERY 6 HOURS AS NEEDED FOR NAUSEA FOR VOMITING 09/18/23   Ladene Artist, MD  sennosides-docusate sodium  (SENOKOT-S) 8.6-50 MG tablet Take 2 tablets by mouth 2 (two) times daily. Patient not taking: Reported on 09/27/2023 09/06/23   [provider]  sorbitol 70 % SOLN Take 15 ml by mouth every 6 hours until BM occurs as directed by physician.  Then take 15 ml daily as needed for constipation. 09/06/23   Ladene Artist, MD  sucralfate (CARAFATE) 1 g tablet Take 1 tablet (1 g total) by mouth 4 (four) times daily -  with meals and at bedtime. Dissolve tablet in water to make liquid 08/11/23   Ladene Artist, MD    Physical Exam: Vitals:   09/27/23 1536  BP: (!) 139/99  Pulse: 79  Resp: 18  Temp: (!) 97.5 F (36.4 C)  TempSrc: Oral  SpO2:  99%   Physical Exam Vitals and nursing note reviewed.  Constitutional:      General: He is awake. He is not in acute distress.    Appearance: Normal appearance. He is ill-appearing.  HENT:     Head: Normocephalic.     Nose: No rhinorrhea.     Mouth/Throat:     Mouth: Mucous membranes are moist.  Eyes:     General: No scleral icterus.    Pupils: Pupils are equal, round, and reactive to light.  Neck:     Vascular: No JVD.  Cardiovascular:     Rate and Rhythm: Normal rate and regular rhythm.     Heart sounds: S1 normal and S2 normal.  Pulmonary:     Breath sounds: No wheezing, rhonchi or rales.  Abdominal:     General: Bowel sounds are normal.     Palpations: Abdomen is soft.     Tenderness: There is abdominal tenderness.  Musculoskeletal:     Cervical back: Neck supple.     Right lower leg: No edema.     Left lower leg: No edema.  Skin:    General: Skin is warm and dry.  Neurological:     General: No focal deficit present.     Mental Status: He is alert and oriented to person, place, and time.  Psychiatric:        Mood and Affect: Mood normal.        Behavior: Behavior normal. Behavior is cooperative.     Data Reviewed:  Results are pending, will review when available.  Assessment and Plan: Principal Problem:    Intractable nausea and vomiting In the setting of:   Gastric cancer (HCC) High suspicion for gastric outlet obstruction. Observation/MedSurg. Continue analgesics as needed. Continue antiemetics as needed. Would like to try clear diet.  Active Problems:   Other ascites IR will evaluate tomorrow.    Hypokalemia Replacing. Follow potassium level.    Hyponatremia Secondary to GI losses. Continue IV fluids. Follow-up sodium level in AM.    Dyslipidemia Not on statin. Follow-up with primary care provider.    HTN (hypertension) No longer on antihypertensives.    GERD (gastroesophageal reflux disease) Pantoprazole 40 mg IVP x 1 today.   Advance Care Planning:   Code Status: Full Code   Consults: Interventional radiology Irish Lack, MD). [place] [facility] will see in AM.  Family Communication:   Severity of Illness: The appropriate patient status for this patient is OBSERVATION. Observation status is judged to be reasonable and necessary in order to provide the required intensity of service to ensure the patient's safety. The patient's presenting symptoms, physical exam findings, and initial radiographic and laboratory data in the context of their medical condition is felt to place them at decreased risk for further clinical deterioration. Furthermore, it is anticipated that the patient will be medically stable for discharge from the hospital within 2 midnights of admission.   Author: Bobette Mo, MD 09/27/2023 3:59 PM  For on call review [email or web address].   This document was prepared using Dragon voice recognition software and may contain some unintended transcription errors.

## 2023-09-27 NOTE — Progress Notes (Signed)
Patient came in for a follow-up with [facility] NP. Patient has c/o fatigue, weight loss, pain, nausea, vomiting, unable to keep anything down. Per Clayborn Heron NP patient will be admitted to [facility] to rule out gastric outlet of obstruction. Pt received IV fluids, potassium, decadron, famotidine while waiting for a bed. Hospitalist called, bed available [facility] transportation taking patient to [facility].

## 2023-09-27 NOTE — Progress Notes (Signed)
Report given to [facility] team. Care transferred to them. Patient to be transferred inpatient.

## 2023-09-28 ENCOUNTER — Observation Stay (HOSPITAL_COMMUNITY): Payer: Medicaid Other

## 2023-09-28 ENCOUNTER — Encounter: Payer: Self-pay | Admitting: *Deleted

## 2023-09-28 DIAGNOSIS — R112 Nausea with vomiting, unspecified: Secondary | ICD-10-CM | POA: Diagnosis not present

## 2023-09-28 LAB — COMPREHENSIVE METABOLIC PANEL
ALT: 20 U/L (ref 0–44)
AST: 16 U/L (ref 15–41)
Albumin: 2.2 g/dL — ABNORMAL LOW (ref 3.5–5.0)
Alkaline Phosphatase: 81 U/L (ref 38–126)
Anion gap: 7 (ref 5–15)
BUN: 9 mg/dL (ref 6–20)
CO2: 24 mmol/L (ref 22–32)
Calcium: 8.8 mg/dL — ABNORMAL LOW (ref 8.9–10.3)
Chloride: 102 mmol/L (ref 98–111)
Creatinine, Ser: 0.8 mg/dL (ref 0.61–1.24)
GFR, Estimated: >60 mL/min (ref >60)
Glucose, Bld: 134 mg/dL — ABNORMAL HIGH (ref 70–99)
Potassium: 4.1 mmol/L (ref 3.5–5.1)
Sodium: 133 mmol/L — ABNORMAL LOW (ref 135–145)
Total Bilirubin: 0.6 mg/dL (ref 0.0–1.2)
Total Protein: 5.2 g/dL — ABNORMAL LOW (ref 6.5–8.1)

## 2023-09-28 LAB — DIFFERENTIAL
Abs Immature Granulocytes: 0 10*3/uL (ref 0.00–0.07)
Basophils Absolute: 0 10*3/uL (ref 0.0–0.1)
Basophils Relative: 0 %
Eosinophils Absolute: 0 10*3/uL (ref 0.0–0.5)
Eosinophils Relative: 0 %
Immature Granulocytes: 0 %
Lymphocytes Relative: 29 %
Lymphs Abs: 0.6 10*3/uL — ABNORMAL LOW (ref 0.7–4.0)
Monocytes Absolute: 0.4 10*3/uL (ref 0.1–1.0)
Monocytes Relative: 18 %
Neutro Abs: 1.2 10*3/uL — ABNORMAL LOW (ref 1.7–7.7)
Neutrophils Relative %: 53 %

## 2023-09-28 LAB — CBC
HCT: 31.8 % — ABNORMAL LOW (ref 39.0–52.0)
Hemoglobin: 10.4 g/dL — ABNORMAL LOW (ref 13.0–17.0)
MCH: 29.6 pg (ref 26.0–34.0)
MCHC: 32.7 g/dL (ref 30.0–36.0)
MCV: 90.6 fL (ref 80.0–100.0)
Platelets: 198 10*3/uL (ref 150–400)
RBC: 3.51 MIL/uL — ABNORMAL LOW (ref 4.22–5.81)
RDW: 13.9 % (ref 11.5–15.5)
WBC: 2.3 10*3/uL — ABNORMAL LOW (ref 4.0–10.5)
nRBC: 0 % (ref 0.0–0.2)

## 2023-09-28 SURGERY — ESOPHAGOGASTRODUODENOSCOPY (EGD) WITH PROPOFOL
Anesthesia: Monitor Anesthesia Care

## 2023-09-28 MED ORDER — IOHEXOL 300 MG/ML  SOLN
30.0000 mL | Freq: Once | INTRAMUSCULAR | Status: DC | PRN
Start: 2023-09-28 — End: 2023-09-29

## 2023-09-28 MED ORDER — APIXABAN 5 MG PO TABS
5.0000 mg | ORAL_TABLET | Freq: Two times a day (BID) | ORAL | Status: DC
  Administered 2023-09-28 – 2023-09-29 (×3): 5 mg via ORAL
  Filled 2023-09-28 (×3): qty 1

## 2023-09-28 MED ORDER — PANTOPRAZOLE SODIUM 40 MG PO TBEC
40.0000 mg | DELAYED_RELEASE_TABLET | Freq: Every day | ORAL | Status: DC
  Administered 2023-09-28 – 2023-09-29 (×2): 40 mg via ORAL
  Filled 2023-09-28 (×2): qty 1

## 2023-09-28 MED ORDER — BOOST / RESOURCE BREEZE PO LIQD CUSTOM
1.0000 | Freq: Three times a day (TID) | ORAL | Status: DC
  Administered 2023-09-28 – 2023-09-29 (×3): 1 via ORAL

## 2023-09-28 MED ORDER — IOHEXOL 300 MG/ML  SOLN
100.0000 mL | Freq: Once | INTRAMUSCULAR | Status: AC | PRN
  Administered 2023-09-28: 100 mL via INTRAVENOUS

## 2023-09-28 MED ORDER — LIDOCAINE HCL 1 % IJ SOLN
INTRAMUSCULAR | Status: AC
  Filled 2023-09-28: qty 20

## 2023-09-28 MED ORDER — POLYETHYLENE GLYCOL 3350 17 G PO PACK
17.0000 g | PACK | Freq: Every day | ORAL | Status: DC
Start: 2023-09-28 — End: 2023-09-29
  Administered 2023-09-28 – 2023-09-29 (×2): 17 g via ORAL
  Filled 2023-09-28 (×2): qty 1

## 2023-09-28 MED ORDER — SENNOSIDES-DOCUSATE SODIUM 8.6-50 MG PO TABS
1.0000 | ORAL_TABLET | Freq: Two times a day (BID) | ORAL | Status: DC
Start: 2023-09-28 — End: 2023-09-29
  Administered 2023-09-28 – 2023-09-29 (×2): 1 via ORAL
  Filled 2023-09-28 (×3): qty 1

## 2023-09-28 MED ORDER — SODIUM CHLORIDE 0.9 % IV SOLN
INTRAVENOUS | Status: DC
Start: 2023-09-28 — End: 2023-09-28

## 2023-09-28 MED ORDER — HYDROMORPHONE HCL 4 MG PO TABS
4.0000 mg | ORAL_TABLET | ORAL | Status: DC | PRN
  Administered 2023-09-28 – 2023-09-29 (×3): 4 mg via ORAL
  Filled 2023-09-28 (×3): qty 1

## 2023-09-28 NOTE — Plan of Care (Signed)

## 2023-09-28 NOTE — Procedures (Signed)
Ultrasound-guided  therapeutic paracentesis performed yielding 5 liters of yellow fluid. No immediate complications. EBL none.

## 2023-09-28 NOTE — Progress Notes (Signed)
Request sent to pathology for CLDN 18 testing on accession number [id number]

## 2023-09-28 NOTE — Progress Notes (Signed)
Initial Nutrition Assessment  INTERVENTION:   -Boost Breeze po TID, each supplement provides 250 kcal and 9 grams of protein -as tolerated  -Trial Magic cup BID with meals, each supplement provides 290 kcal and 9 grams of protein   NUTRITION DIAGNOSIS:   Increased nutrient needs related to cancer and cancer related treatments as evidenced by estimated needs.  GOAL:   Patient will meet greater than or equal to 90% of their needs  MONITOR:   PO intake, Supplement acceptance  REASON FOR ASSESSMENT:   Malnutrition Screening Tool    ASSESSMENT:   63 year old male with history of DVT, gastric cancer, GERD, hypertension, peripheral artery disease who presented initially at [facility] for further evaluation of abdominal pain, nausea, vomiting.  Patient was unable to tolerate any thing by mouth.  On presentation, lab work showed potassium of 3.1, WC count of 2.9.  Patient admitted for the management of intractable nausea and vomiting, inability to tolerate oral intake likely from gastric cancer. Last paracentesis 2/6, yield 4L.  Patient unavailable at this time, having CT and plan is for paracentesis today.  Alerted to admission by [facility] who has been following patient since January 2025. Pt had struggled to eat consistently given N/V that fluctuates. Pt did eat fried fish earlier this week which may have prompted symptoms.  Pt currently on full liquids, will order Boost Breeze and Magic cups with lunch/dinner to maximize kcals and protein. Pt was recommended nutrition support 2/19 by [facility] if pt continues to have N/V and inability to tolerate adequate amounts of PO. Ascites may impact feeding tube placement.   Patient's UBW ~180 lbs. Per weight records, pt has lost 19 lbs since 07/20/23 (11% wt loss x 2 months, significant for time frame).  Medications: Miralax, Senokot  Labs reviewed: Low Na   NUTRITION - FOCUSED PHYSICAL EXAM:  Unable to  complete, working remotely.  Diet Order:   Diet Order             Diet full liquid Room service appropriate? Yes; Fluid consistency: Thin  Diet effective now                   EDUCATION NEEDS:   Not appropriate for education at this time  Skin:  Skin Assessment: Reviewed RN Assessment  Last BM:  PTA  Height:   Ht Readings from Last 1 Encounters:  09/27/23 6\' 1"  (1.854 m)    Weight:   Wt Readings from Last 1 Encounters:  09/27/23 66.7 kg    BMI:  Body mass index is 19.39 kg/m.  Estimated Nutritional Needs:   Kcal:  2000-2200  Protein:  100-110g  Fluid:  2L/day   Tilda Franco, MS, RD, LDN Inpatient Clinical Dietitian Contact via Secure chat

## 2023-09-28 NOTE — Progress Notes (Signed)
IP PROGRESS NOTE  Subjective:   [name] was admitted yesterday with intractable nausea/vomiting and dehydration.  He reports feeling better today.  He wants to eat.  No vomiting overnight. He has been following a liquid/mechanical soft diet at home, but ate a regular meal with fried fish earlier this week. Objective: Vital signs in last 24 hours: Blood pressure 97/72, pulse 66, temperature 98.5 F (36.9 C), temperature source Oral, resp. rate 18, height 6\' 1"  (1.854 m), weight 146 lb 15.7 oz (66.7 kg), SpO2 97%.  Intake/Output from previous day: 02/19 0701 - 02/20 0700 In: 117.3 [I.V.:26.1; IV Piggyback:91.2] Out: -   Physical Exam:  HEENT: No thrush Lungs: Lungs clear bilaterally Cardiac: Regular rate and rhythm Abdomen: Distended with ascites, nontender Extremities: No leg edema   Portacath/PICC-without erythema  Lab Results: Recent Labs    09/27/23 0755 09/28/23 0522  WBC 2.9* 2.3*  HGB 13.2 10.4*  HCT 39.2 31.8*  PLT 234 198    BMET Recent Labs    09/27/23 0755 09/28/23 0522  NA 134* 133*  K 3.1* 4.1  CL 94* 102  CO2 30 24  GLUCOSE 98 134*  BUN 11 9  CREATININE 0.96 0.80  CALCIUM 9.7 8.8*    Lab Results  Component Value Date   CEA 1.31 08/11/2023    Studies/Results: No results found.  Medications: I have reviewed the patient's current medications.  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 Paracentesis 07/17/2023-acute inflammation, no malignant cells CT biopsy of right upper quadrant  omental thickening 07/19/2023: Metastatic total differentiated carcinoma consistent with primary gastric carcinoma, normal mismatch repair protein expression , HER2 (0); foundation 1-microsatellite stable, tumor mutation burden 2, PD-L1 CPS 1; [place] PD-L1 CPS 60% Cycle 1 FOLFOX/nivolumab 08/16/2023 Cycle 2 FOLFOX/nivolumab 08/30/2023 Cycle 3 FOLFOX/nivolumab 09/13/2023   Pain secondary to #1 History of recurrent venous thrombosis-maintained on apixaban anticoagulation Tobacco and alcohol use Family history of multiple cancers Anorexia/weight loss and constipation secondary to #1 Left hip replacement June 2024 Hypokalemia, secondary to poor nutrition and n/v. Currently takes oral KCL BID Admission 09/27/2023 with nausea/vomiting and dehydration   [name] was admitted yesterday with intractable nausea/vomiting and dehydration.  He has metastatic gastric cancer with tumor involving the gastric antrum and carcinomatosis.  We were concerned of gastric outlet obstruction yesterday.  It is possible his symptoms are related to the fried fish meal he had a few days ago.  He would like to resume a liquid diet.  He has completed 3 cycles of FOLFOX/nivolumab.  I will order a restaging CT abdomen/pelvis.  Hopefully he can resume a liquid diet and be discharged to home over the next 1-2 days.  Recommendations: Therapeutic paracentesis Restaging CT abdomen/pelvis Check WBC differential Resume oral Dilaudid for pain Bowel regimen Liquid diet Discharge to home when he is tolerating liquids    LOS: 1 day   Thornton Papas, MD   09/28/2023, 8:16 AM

## 2023-09-28 NOTE — Progress Notes (Signed)
PROGRESS NOTE  [name]  UYQ:[id number] DOB: August 23, 1960 DOA: 09/27/2023 PCP: Wanda Plump, MD   Brief Narrative: Patient is a 63 year old male with history of DVT, gastric cancer, GERD, hypertension, peripheral artery disease who presented initially at [facility] for further evaluation of abdominal pain, nausea, vomiting.  Patient was unable to tolerate any thing by mouth.  On presentation, lab work showed potassium of 3.1, WC count of 2.9.  Patient admitted for the management of intractable nausea and vomiting, inability to tolerate oral intake likely from gastric cancer.  Started on IV fluids.  Oncology following.  Plan for doing a restaging CT abdomen/pelvis, paracentesis today.  Assessment & Plan:  Principal Problem:   Intractable nausea and vomiting Active Problems:   Dyslipidemia   HTN (hypertension)   GERD (gastroesophageal reflux disease)   Other ascites   Gastric cancer (HCC)   Hypokalemia   Hyponatremia   Intractable nausea/vomiting/abdominal pain: Most likely triggered in the setting of gastric cancer.  CT abdomen was ordered.  Rule out gastric outlet obstruction. Continue IV fluids.  Will try clear liquid diet today.  Will advance to full liquid diet if he tolerates.  Gastric cancer: Follows with oncology, Dr.Sherrill.  On FOLFOX/nivo regimen.Biopsy had shown poorly differentiated gastric adenocarcinoma with signet cell features.  History of tobacco/alcohol use. Oncology has recommended CT abdomen/pelvis with contrast for restaging.  Oncology following  History of recurrent PE/DVT: On Eliquis at home.Restarted  Ascites: Secondary to gastric cancer.  Undergoes regular paracentesis.  Ordered for paracentesis  Hyponatremia: Started on gentle IV fluids.  Monitor  GERD: Continue PPI        DVT prophylaxis:     Code Status: Full Code  Family Communication: None at bedside  Patient status:Inpatient  Patient is from :Home  Anticipated  discharge GL:OVFI  Estimated DC date:1-2 days   Consultants: Oncology  Procedures:None  Antimicrobials:  Anti-infectives (From admission, onward)    None       Subjective:  Patient seen and examined at bedside today.  Hemodynamically stable.  Denies abdomen, nausea or vomiting.  He was drinking oral contrast for CT abdomen/pelvis.  On room air.  Objective: Vitals:   09/27/23 1642 09/27/23 1659 09/27/23 2121 09/28/23 0439  BP: (!) 155/97  111/83 97/72  Pulse: 85  67 66  Resp: 14  17 18   Temp: 98.2 F (36.8 C)  97.7 F (36.5 C) 98.5 F (36.9 C)  TempSrc:   Oral Oral  SpO2: 98%  99% 97%  Weight:  66.7 kg    Height:  6\' 1"  (1.854 m)      Intake/Output Summary (Last 24 hours) at 09/28/2023 0750 Last data filed at 09/27/2023 1710 Gross per 24 hour  Intake 117.31 ml  Output --  Net 117.31 ml   Filed Weights   09/27/23 1659  Weight: 66.7 kg    Examination:  General exam: Overall comfortable, not in distress HEENT: PERRL Respiratory system:  no wheezes or crackles  Cardiovascular system: S1 & S2 heard, RRR.  Gastrointestinal system: Abdomen is distended, soft and nontender.Ascites Central nervous system: Alert and oriented Extremities: No edema, no clubbing ,no cyanosis Skin: No rashes, no ulcers,no icterus     Data Reviewed: I have personally reviewed following labs and imaging studies  CBC: Recent Labs  Lab 09/27/23 0755 09/28/23 0522  WBC 2.9* 2.3*  NEUTROABS 1.0*  --   HGB 13.2 10.4*  HCT 39.2 31.8*  MCV 87.7 90.6  PLT 234 198   Basic  Metabolic Panel: Recent Labs  Lab 09/27/23 0755 09/28/23 0522  NA 134* 133*  K 3.1* 4.1  CL 94* 102  CO2 30 24  GLUCOSE 98 134*  BUN 11 9  CREATININE 0.96 0.80  CALCIUM 9.7 8.8*     No results found for this or any previous visit (from the past 240 hours).   Radiology Studies: No results found.  Scheduled Meds:  nicotine  14 mg Transdermal Daily   Continuous Infusions:  dextrose 5 % and 0.9 %  NaCl 125 mL/hr (09/27/23 2000)     LOS: 1 day   Burnadette Pop, MD [facility] P2/20/2025, 7:50 AM

## 2023-09-29 DIAGNOSIS — R112 Nausea with vomiting, unspecified: Secondary | ICD-10-CM | POA: Diagnosis not present

## 2023-09-29 LAB — BASIC METABOLIC PANEL
Anion gap: 7 (ref 5–15)
BUN: 6 mg/dL (ref 6–20)
CO2: 24 mmol/L (ref 22–32)
Calcium: 8.6 mg/dL — ABNORMAL LOW (ref 8.9–10.3)
Chloride: 102 mmol/L (ref 98–111)
Creatinine, Ser: 0.78 mg/dL (ref 0.61–1.24)
GFR, Estimated: >60 mL/min (ref >60)
Glucose, Bld: 78 mg/dL (ref 70–99)
Potassium: 3.2 mmol/L — ABNORMAL LOW (ref 3.5–5.1)
Sodium: 133 mmol/L — ABNORMAL LOW (ref 135–145)

## 2023-09-29 MED ORDER — HEPARIN SOD (PORK) LOCK FLUSH 100 UNIT/ML IV SOLN
500.0000 [IU] | INTRAVENOUS | Status: DC | PRN
  Filled 2023-09-29: qty 5

## 2023-09-29 MED ORDER — POTASSIUM CHLORIDE CRYS ER 20 MEQ PO TBCR
40.0000 meq | EXTENDED_RELEASE_TABLET | Freq: Once | ORAL | Status: AC
  Administered 2023-09-29: 40 meq via ORAL
  Filled 2023-09-29: qty 2

## 2023-09-29 NOTE — Progress Notes (Signed)
IP PROGRESS NOTE  Subjective:   [name] reports feeling better.  He is tolerating a diet.  No further vomiting.  He is having bowel movements.  He underwent a paracentesis yesterday. Objective: Vital signs in last 24 hours: Blood pressure (!) 116/90, pulse 85, temperature 98.1 F (36.7 C), temperature source Oral, resp. rate 16, height 6\' 1"  (1.854 m), weight 146 lb 15.7 oz (66.7 kg), SpO2 100%.  Intake/Output from previous day: 02/20 0701 - 02/21 0700 In: 657 [P.O.:237; I.V.:420] Out: 750 [Urine:750]  Physical Exam:  HEENT: Mild early thrush at the palate/pharynx  Abdomen: Distended with ascites, nontender, firmness in the upper abdomen Extremities: No leg edema   Portacath/PICC-without erythema  Lab Results: Recent Labs    09/27/23 0755 09/28/23 0522  WBC 2.9* 2.3*  HGB 13.2 10.4*  HCT 39.2 31.8*  PLT 234 198    BMET Recent Labs    09/28/23 0522 09/29/23 0530  NA 133* 133*  K 4.1 3.2*  CL 102 102  CO2 24 24  GLUCOSE 134* 78  BUN 9 6  CREATININE 0.80 0.78  CALCIUM 8.8* 8.6*    Lab Results  Component Value Date   CEA 1.31 08/11/2023    Studies/Results: CT ABDOMEN PELVIS W CONTRAST Result Date: 09/28/2023 CLINICAL DATA:  Staging gastric carcinoma metastatic carcinoma. * Tracking Code: BO * insert EXAM: CT ABDOMEN AND PELVIS WITH CONTRAST TECHNIQUE: Multidetector CT imaging of the abdomen and pelvis was performed using the standard protocol following bolus administration of intravenous contrast. RADIATION DOSE REDUCTION: This exam was performed according to the departmental dose-optimization program which includes automated exposure control, adjustment of the mA and/or kV according to patient size and/or use of iterative reconstruction technique. CONTRAST:  OMNIPAQUE IOHEXOL 300 MG/ML  SOLN COMPARISON:  CT 09/15/2022 FINDINGS: Lower chest: Lung bases are clear. Hepatobiliary: No focal hepatic lesions. Scattered hypodensities are less conspicuous.  Single hypodensity remains along the posterior aspect of the RIGHT hepatic lobe measuring less than 1 cm on image 31/series 2 Postcholecystectomy. Large volume intraperitoneal free fluid surrounds the liver increased from prior. Pancreas: Pancreas is normal. No ductal dilatation. No pancreatic inflammation. Spleen: Normal spleen Adrenals/urinary tract: Adrenal glands normal. RIGHT renal cortical scarring. Ureters and bladder normal. Stomach/Bowel: Stomach appears normal. A mild nonspecific thickening through the pyloric region. Duodenum and small-bowel normal. Small bowel flow centrally within large volume ascites. The colon and rectosigmoid colon are normal. Vascular/Lymphatic: Abdominal aorta is normal caliber. No periportal or retroperitoneal adenopathy. No pelvic adenopathy. Reproductive: Unremarkable Other: Large volume intraperitoneal free fluid. This large volume of fluid limits evaluation of the omentum and peritoneum. No clear evidence of peritoneal nodularity. Musculoskeletal: No aggressive osseous lesion. IMPRESSION: 1. Large volume intraperitoneal free fluid increased from prior. 2. No clear evidence of peritoneal carcinomatosis. Difficult to assess omentum with large volume ascites. 3. Decreased conspicuity of hepatic hypodensities. Single hypodensity remains along the posterior aspect of the RIGHT hepatic lobe. 4. Mild nonspecific thickening of the pyloric region of the stomach. 5. RIGHT renal cortical scarring. Electronically Signed   By: Genevive Bi M.D.   On: 09/28/2023 15:16   US Paracentesis Result Date: 09/28/2023 INDICATION: Patient with history of stage IV gastric cancer, recurrent ascites. Request received for therapeutic paracentesis. EXAM: ULTRASOUND GUIDED THERAPEUTIC PARACENTESIS MEDICATIONS: 8 mL 1% lidocaine COMPLICATIONS: None immediate. PROCEDURE: Informed written consent was obtained from the patient after a discussion of the risks, benefits and alternatives to treatment. A  timeout was performed prior to the initiation of the  procedure. Initial ultrasound scanning demonstrates a large amount of ascites within the right mid to lower abdominal quadrant. The right mid to lower abdomen was prepped and draped in the usual sterile fashion. 1% lidocaine was used for local anesthesia. Following this, a 19 gauge, 10-cm, Yueh catheter was introduced. An ultrasound image was saved for documentation purposes. The paracentesis was performed. The catheter was removed and a dressing was applied. The patient tolerated the procedure well without immediate post procedural complication. FINDINGS: A total of approximately 5 liters of yellow fluid was removed. IMPRESSION: Successful ultrasound-guided therapeutic paracentesis yielding 5 liters of peritoneal fluid. Performed by: Artemio Aly Electronically Signed   By: Richarda Overlie M.D.   On: 09/28/2023 12:30    Medications: I have reviewed the patient's current medications.  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 Paracentesis 07/17/2023-acute inflammation, no malignant cells CT biopsy of right upper quadrant omental thickening 07/19/2023: Metastatic total differentiated carcinoma consistent with primary gastric carcinoma, normal mismatch repair protein expression , HER2 (0); foundation 1-microsatellite stable, tumor mutation burden 2, PD-L1 CPS 1; [facility] PD-L1 CPS 60% Cycle 1 FOLFOX/nivolumab 08/16/2023 Cycle 2 FOLFOX/nivolumab 08/30/2023 Cycle 3 FOLFOX/nivolumab 09/13/2023 CT abdomen/pelvis  09/28/2023-hypodense liver lesions are less conspicuous, single remaining posterior right liver lesion measures 1 cm, large volume ascites, stomach appears normal with mild thickening at the pyloric region of a large volume ascites limits evaluation of omentum and peritoneum   Pain secondary to #1 History of recurrent venous thrombosis-maintained on apixaban anticoagulation Tobacco and alcohol use Family history of multiple cancers Anorexia/weight loss and constipation secondary to #1 Left hip replacement June 2024 Hypokalemia, secondary to poor nutrition and n/v. Currently takes oral KCL BID Admission 09/27/2023 with nausea/vomiting and dehydration   [name] was admitted 09/27/2023 with intractable nausea/vomiting and dehydration.  He has metastatic gastric cancer with tumor involving the gastric antrum and carcinomatosis.  He feels better after undergoing a paracentesis yesterday.  He is tolerating liquids.  A CT abdomen/pelvis yesterday reveals no evidence of disease progression.  No evidence of gastric outlet obstruction.  It is difficult to visualize the peritoneal tumor, but I cannot appreciate persistent omental nodularity/stranding anteriorly.  There is no evidence of disease progression.  The plan is to continue FOLFOX/nivolumab as an outpatient.   Recommendations: Continue full liquid/mechanical soft/low residual diet Continue oral Dilaudid for pain He appears stable for discharge if he is tolerating a diet today Outpatient follow-up will be scheduled at the Cancer center next week for an office visit and FOLFOX/nivolumab    LOS: 1 day   Thornton Papas, MD   09/29/2023, 9:19 AM

## 2023-09-29 NOTE — Discharge Summary (Signed)
Physician Discharge Summary  KAMIR SELOVER BJY:[id number] DOB: July 03, 1961 DOA: 09/27/2023  PCP: Wanda Plump, MD  Admit date: 09/27/2023 Discharge date: 09/29/2023  Admitted From: Home Disposition:  Home  Discharge Condition:Stable CODE STATUS:FULL Diet recommendation: Full liquid/soft diet  Brief/Interim Summary: Patient is a 63 year old male with history of DVT, gastric cancer, GERD, hypertension, peripheral artery disease who presented initially at [facility] for further evaluation of abdominal pain, nausea, vomiting.  Patient was unable to tolerate any thing by mouth.  On presentation, lab work showed potassium of 3.1, WC count of 2.9.  Patient admitted for the management of intractable nausea and vomiting, inability to tolerate oral intake likely from gastric cancer.  Started on IV fluids.  Underwent thoracentesis with removal of 5 L of ascitic fluid.  Currently tolerating soft diet.  CT abdomen/pelvis did not show any evidence of gastric outlet obstruction.  Medically stable for discharge today.  Following problems were addressed during the hospitalization:  Intractable nausea/vomiting/abdominal pain: The symptoms have mostly resolved.  Currently tolerating soft diet.  We recommend to continue soft, full liquid diet at home.    Gastric cancer: Follows with oncology, Dr.Sherrill.  On FOLFOX/nivo regimen.Biopsy had shown poorly differentiated gastric adenocarcinoma with signet cell features.  History of tobacco/alcohol use. CT abdomen/pelvis did not show any signs of gastric outlet obstruction.   History of recurrent PE/DVT: On Eliquis at home.Restarted   Ascites: Secondary to gastric cancer.  Undergoes regular paracentesis.  Underwent paracentesis here with removal of 5 L of ascitic fluid.   Hyponatremia: Stable.   GERD: Continue PPI  Discharge Diagnoses:  Principal Problem:   Intractable nausea and vomiting Active Problems:   Dyslipidemia   HTN  (hypertension)   GERD (gastroesophageal reflux disease)   Other ascites   Gastric cancer (HCC)   Hypokalemia   Hyponatremia    Discharge Instructions  Discharge Instructions     Diet general   Complete by: As directed    Soft/full liquid diet   Increase activity slowly   Complete by: As directed       Allergies as of 09/29/2023       Reactions   Pravastatin Itching   Simvastatin Itching        Medication List     TAKE these medications    Eliquis 5 MG Tabs tablet Generic drug: apixaban Take 1 tablet by mouth twice daily What changed:  how much to take when to take this additional instructions   EPINEPHrine 0.3 mg/0.3 mL Soaj injection Commonly known as: EpiPen 2-Pak Inject 0.3 mg into the muscle as needed for anaphylaxis.   HYDROmorphone 4 MG tablet Commonly known as: Dilaudid Take 1-2 tablets (4-8 mg total) by mouth every 4 (four) hours as needed for severe pain (pain score 7-10). What changed:  how much to take when to take this additional instructions   lidocaine-prilocaine cream Commonly known as: EMLA Apply 1 Application topically as needed (Apply to Port 1-2 hours prior to its use).   NICODERM CQ TD Place 1 patch onto the skin daily as needed (for smoking cessation). What changed: Another medication with the same name was removed. Continue taking this medication, and follow the directions you see here.   ondansetron 8 MG tablet Commonly known as: ZOFRAN TAKE 1 TABLET BY MOUTH EVERY 8 HOURS AS NEEDED (START  3  DAYS  AFTER  CHEMO  AS  NEEDED  FOR  NAUSEA/VOMITING). What changed: See the new instructions.   pantoprazole 40 MG tablet  Commonly known as: PROTONIX Take 1 tablet (40 mg total) by mouth 2 (two) times daily.   polyethylene glycol 17 g packet Commonly known as: MIRALAX / GLYCOLAX Take 17 g by mouth daily as needed for mild constipation. What changed: reasons to take this   potassium chloride 10 MEQ tablet Commonly known as:  KLOR-CON M Take 1 tablet (10 mEq total) by mouth 2 (two) times daily.   prochlorperazine 10 MG tablet Commonly known as: COMPAZINE TAKE 1 TABLET BY MOUTH EVERY 6 HOURS AS NEEDED FOR NAUSEA FOR VOMITING What changed: See the new instructions.   sennosides-docusate sodium 8.6-50 MG tablet Commonly known as: SENOKOT-S Take 2 tablets by mouth 2 (two) times daily as needed for constipation (hold for diarrhea).   sorbitol 70 % Soln Take 15 ml by mouth every 6 hours until BM occurs as directed by physician.  Then take 15 ml daily as needed for constipation. What changed: additional instructions   sucralfate 1 g tablet Commonly known as: Carafate Take 1 tablet (1 g total) by mouth 4 (four) times daily -  with meals and at bedtime. Dissolve tablet in water to make liquid        Follow-up Information     Wanda Plump, MD. Schedule an appointment as soon as possible for a visit in 1 week(s).   Specialty: Internal Medicine Contact information: [address] [place] [address] [phone]                Allergies  Allergen Reactions   Pravastatin Itching   Simvastatin Itching    Consultations: Oncology   Procedures/Studies: CT ABDOMEN PELVIS W CONTRAST Result Date: 09/28/2023 CLINICAL DATA:  Staging gastric carcinoma metastatic carcinoma. * Tracking Code: BO * insert EXAM: CT ABDOMEN AND PELVIS WITH CONTRAST TECHNIQUE: Multidetector CT imaging of the abdomen and pelvis was performed using the standard protocol following bolus administration of intravenous contrast. RADIATION DOSE REDUCTION: This exam was performed according to the departmental dose-optimization program which includes automated exposure control, adjustment of the mA and/or kV according to patient size and/or use of iterative reconstruction technique. CONTRAST:  OMNIPAQUE IOHEXOL 300 MG/ML  SOLN COMPARISON:  CT 09/15/2022 FINDINGS: Lower chest: Lung bases are clear. Hepatobiliary: No  focal hepatic lesions. Scattered hypodensities are less conspicuous. Single hypodensity remains along the posterior aspect of the RIGHT hepatic lobe measuring less than 1 cm on image 31/series 2 Postcholecystectomy. Large volume intraperitoneal free fluid surrounds the liver increased from prior. Pancreas: Pancreas is normal. No ductal dilatation. No pancreatic inflammation. Spleen: Normal spleen Adrenals/urinary tract: Adrenal glands normal. RIGHT renal cortical scarring. Ureters and bladder normal. Stomach/Bowel: Stomach appears normal. A mild nonspecific thickening through the pyloric region. Duodenum and small-bowel normal. Small bowel flow centrally within large volume ascites. The colon and rectosigmoid colon are normal. Vascular/Lymphatic: Abdominal aorta is normal caliber. No periportal or retroperitoneal adenopathy. No pelvic adenopathy. Reproductive: Unremarkable Other: Large volume intraperitoneal free fluid. This large volume of fluid limits evaluation of the omentum and peritoneum. No clear evidence of peritoneal nodularity. Musculoskeletal: No aggressive osseous lesion. IMPRESSION: 1. Large volume intraperitoneal free fluid increased from prior. 2. No clear evidence of peritoneal carcinomatosis. Difficult to assess omentum with large volume ascites. 3. Decreased conspicuity of hepatic hypodensities. Single hypodensity remains along the posterior aspect of the RIGHT hepatic lobe. 4. Mild nonspecific thickening of the pyloric region of the stomach. 5. RIGHT renal cortical scarring. Electronically Signed   By: Loura Halt.D.  On: 09/28/2023 15:16   US Paracentesis Result Date: 09/28/2023 INDICATION: Patient with history of stage IV gastric cancer, recurrent ascites. Request received for therapeutic paracentesis. EXAM: ULTRASOUND GUIDED THERAPEUTIC PARACENTESIS MEDICATIONS: 8 mL 1% lidocaine COMPLICATIONS: None immediate. PROCEDURE: Informed written consent was obtained from the patient after a  discussion of the risks, benefits and alternatives to treatment. A timeout was performed prior to the initiation of the procedure. Initial ultrasound scanning demonstrates a large amount of ascites within the right mid to lower abdominal quadrant. The right mid to lower abdomen was prepped and draped in the usual sterile fashion. 1% lidocaine was used for local anesthesia. Following this, a 19 gauge, 10-cm, Yueh catheter was introduced. An ultrasound image was saved for documentation purposes. The paracentesis was performed. The catheter was removed and a dressing was applied. The patient tolerated the procedure well without immediate post procedural complication. FINDINGS: A total of approximately 5 liters of yellow fluid was removed. IMPRESSION: Successful ultrasound-guided therapeutic paracentesis yielding 5 liters of peritoneal fluid. Performed by: Artemio Aly Electronically Signed   By: Richarda Overlie M.D.   On: 09/28/2023 12:30   US Paracentesis Result Date: 09/15/2023 INDICATION: 63 year old male with gastric cancer presents with abdominal distension. Request for therapeutic paracentesis up to 4 L. EXAM: ULTRASOUND GUIDED  PARACENTESIS MEDICATIONS: 5 mL 1% lidocaine COMPLICATIONS: None immediate. PROCEDURE: Informed written consent was obtained from the patient after a discussion of the risks, benefits and alternatives to treatment. A timeout was performed prior to the initiation of the procedure. Initial ultrasound scanning demonstrates a large amount of ascites within the right lower abdominal quadrant. The right lower abdomen was prepped and draped in the usual sterile fashion. 1% lidocaine was used for local anesthesia. Following this, a 19 gauge, 7-cm, Yueh catheter was introduced. An ultrasound image was saved for documentation purposes. The paracentesis was performed. The catheter was removed and a dressing was applied. The patient tolerated the procedure well without immediate post procedural  complication. FINDINGS: A total of approximately 4 L of hazy yellow fluid was removed. IMPRESSION: Successful ultrasound-guided paracentesis yielding 4 liters of peritoneal fluid. Performed by: Lawernce Ion, PA-C Electronically Signed   By: Irish Lack M.D.   On: 09/15/2023 08:02      Subjective: Patient seen and examined at bedside today.  Hemodynamically stable.  Tolerating full liquid/soft diet.  Eager to go home.  Medically stable for discharge  Discharge Exam: Vitals:   09/28/23 2020 09/29/23 0449  BP: 104/81 (!) 116/90  Pulse: 77 85  Resp: 16 16  Temp: 98.2 F (36.8 C) 98.1 F (36.7 C)  SpO2: 100% 100%   Vitals:   09/28/23 1151 09/28/23 1156 09/28/23 2020 09/29/23 0449  BP: 116/87 115/85 104/81 (!) 116/90  Pulse:   77 85  Resp:   16 16  Temp:   98.2 F (36.8 C) 98.1 F (36.7 C)  TempSrc:   Oral Oral  SpO2:   100% 100%  Weight:      Height:        General: Pt is alert, awake, not in acute distress Cardiovascular: RRR, S1/S2 +, no rubs, no gallops Respiratory: CTA bilaterally, no wheezing, no rhonchi Abdominal: Soft, NT, ND, bowel sounds + Extremities: no edema, no cyanosis    The results of significant diagnostics from this hospitalization (including imaging, microbiology, ancillary and laboratory) are listed below for reference.     Microbiology: No results found for this or any previous visit (from the past 240 hours).  Labs: BNP (last 3 results) No results for input(s): "BNP" in the last 8760 hours. Basic Metabolic Panel: Recent Labs  Lab 09/27/23 0755 09/28/23 0522 09/29/23 0530  NA 134* 133* 133*  K 3.1* 4.1 3.2*  CL 94* 102 102  CO2 30 24 24   GLUCOSE 98 134* 78  BUN 11 9 6   CREATININE 0.96 0.80 0.78  CALCIUM 9.7 8.8* 8.6*   Liver Function Tests: Recent Labs  Lab 09/27/23 0755 09/28/23 0522  AST 16 16  ALT 22 20  ALKPHOS 116 81  BILITOT 0.6 0.6  PROT 6.8 5.2*  ALBUMIN 3.3* 2.2*   No results for input(s): "LIPASE", "AMYLASE" in  the last 168 hours. No results for input(s): "AMMONIA" in the last 168 hours. CBC: Recent Labs  Lab 09/27/23 0755 09/28/23 0522  WBC 2.9* 2.3*  NEUTROABS 1.0* 1.2*  HGB 13.2 10.4*  HCT 39.2 31.8*  MCV 87.7 90.6  PLT 234 198   Cardiac Enzymes: No results for input(s): "CKTOTAL", "CKMB", "CKMBINDEX", "TROPONINI" in the last 168 hours. BNP: Invalid input(s): "POCBNP" CBG: No results for input(s): "GLUCAP" in the last 168 hours. D-Dimer No results for input(s): "DDIMER" in the last 72 hours. Hgb A1c No results for input(s): "HGBA1C" in the last 72 hours. Lipid Profile No results for input(s): "CHOL", "HDL", "LDLCALC", "TRIG", "CHOLHDL", "LDLDIRECT" in the last 72 hours. Thyroid function studies No results for input(s): "TSH", "T4TOTAL", "T3FREE", "THYROIDAB" in the last 72 hours.  Invalid input(s): "FREET3" Anemia work up No results for input(s): "VITAMINB12", "FOLATE", "FERRITIN", "TIBC", "IRON", "RETICCTPCT" in the last 72 hours. Urinalysis    Component Value Date/Time   COLORURINE YELLOW 07/11/2023 1213   APPEARANCEUR CLEAR 07/11/2023 1213   LABSPEC <=1.005 (A) 07/11/2023 1213   PHURINE 6.0 07/11/2023 1213   GLUCOSEU NEGATIVE 07/11/2023 1213   HGBUR NEGATIVE 07/11/2023 1213   BILIRUBINUR NEGATIVE 07/11/2023 1213   BILIRUBINUR Negative 02/05/2021 1125   KETONESUR NEGATIVE 07/11/2023 1213   PROTEINUR Positive (A) 02/05/2021 1125   PROTEINUR 100 (A) 02/22/2017 1243   UROBILINOGEN 0.2 07/11/2023 1213   NITRITE NEGATIVE 07/11/2023 1213   LEUKOCYTESUR NEGATIVE 07/11/2023 1213   Sepsis Labs Recent Labs  Lab 09/27/23 0755 09/28/23 0522  WBC 2.9* 2.3*   Microbiology No results found for this or any previous visit (from the past 240 hours).  Please note: You were cared for by a hospitalist during your hospital stay. Once you are discharged, your primary care physician will handle any further medical issues. Please note that NO REFILLS for any discharge medications  will be authorized once you are discharged, as it is imperative that you return to your primary care physician (or establish a relationship with a primary care physician if you do not have one) for your post hospital discharge needs so that they can reassess your need for medications and monitor your lab values.    Time coordinating discharge: 40 minutes  SIGNED:   Burnadette Pop, MD  [facility] 09/29/2023, 10:31 AM Pager [phone]  If 7PM-7AM, please contact night-coverage [email or web address] Password [email or web address]

## 2023-09-29 NOTE — Progress Notes (Signed)
AVS reviewed w/ pt who verbalized an understanding- no other questions at this time- pt will d/c to home once port is deaccessed. Pt had called wife to pick him up.

## 2023-09-29 NOTE — Plan of Care (Signed)

## 2023-10-02 ENCOUNTER — Telehealth: Payer: Self-pay | Admitting: *Deleted

## 2023-10-02 ENCOUNTER — Other Ambulatory Visit: Payer: Self-pay | Admitting: Nurse Practitioner

## 2023-10-02 ENCOUNTER — Inpatient Hospital Stay: Payer: Medicaid Other | Admitting: Genetic Counselor

## 2023-10-02 ENCOUNTER — Inpatient Hospital Stay: Payer: Medicaid Other

## 2023-10-02 DIAGNOSIS — C163 Malignant neoplasm of pyloric antrum: Secondary | ICD-10-CM

## 2023-10-02 MED ORDER — HYDROMORPHONE HCL 4 MG PO TABS: 4.0000 mg | ORAL_TABLET | ORAL | 0 refills | Status: DC | PRN

## 2023-10-02 NOTE — Telephone Encounter (Signed)
 Mrs. Gonzalez called to request refill on patient's hydromorphone. Reports he ran out over the weekend.

## 2023-10-04 ENCOUNTER — Encounter: Payer: Self-pay | Admitting: Nurse Practitioner

## 2023-10-04 ENCOUNTER — Inpatient Hospital Stay (HOSPITAL_BASED_OUTPATIENT_CLINIC_OR_DEPARTMENT_OTHER): Payer: Medicaid Other | Admitting: Nurse Practitioner

## 2023-10-04 ENCOUNTER — Inpatient Hospital Stay: Payer: Medicaid Other

## 2023-10-04 ENCOUNTER — Inpatient Hospital Stay: Payer: Medicaid Other | Admitting: Nutrition

## 2023-10-04 VITALS — BP 114/102 | HR 95 | Temp 97.6°F | Resp 18 | Wt 145.4 lb

## 2023-10-04 VITALS — BP 117/87 | HR 72 | Temp 98.4°F | Resp 16

## 2023-10-04 DIAGNOSIS — C163 Malignant neoplasm of pyloric antrum: Secondary | ICD-10-CM

## 2023-10-04 DIAGNOSIS — Z5111 Encounter for antineoplastic chemotherapy: Secondary | ICD-10-CM | POA: Diagnosis not present

## 2023-10-04 DIAGNOSIS — C169 Malignant neoplasm of stomach, unspecified: Secondary | ICD-10-CM | POA: Diagnosis not present

## 2023-10-04 LAB — CBC WITH DIFFERENTIAL (CANCER CENTER ONLY)
Abs Immature Granulocytes: 0.08 10*3/uL — ABNORMAL HIGH (ref 0.00–0.07)
Basophils Absolute: 0 10*3/uL (ref 0.0–0.1)
Basophils Relative: 0 %
Eosinophils Absolute: 0.1 10*3/uL (ref 0.0–0.5)
Eosinophils Relative: 1 %
HCT: 40.8 % (ref 39.0–52.0)
Hemoglobin: 13.4 g/dL (ref 13.0–17.0)
Immature Granulocytes: 1 %
Lymphocytes Relative: 31 %
Lymphs Abs: 1.8 10*3/uL (ref 0.7–4.0)
MCH: 28.9 pg (ref 26.0–34.0)
MCHC: 32.8 g/dL (ref 30.0–36.0)
MCV: 88.1 fL (ref 80.0–100.0)
Monocytes Absolute: 0.7 10*3/uL (ref 0.1–1.0)
Monocytes Relative: 12 %
Neutro Abs: 3.1 10*3/uL (ref 1.7–7.7)
Neutrophils Relative %: 55 %
Platelet Count: 262 10*3/uL (ref 150–400)
RBC: 4.63 MIL/uL (ref 4.22–5.81)
RDW: 14.4 % (ref 11.5–15.5)
WBC Count: 5.7 10*3/uL (ref 4.0–10.5)
nRBC: 0 % (ref 0.0–0.2)

## 2023-10-04 LAB — CMP (CANCER CENTER ONLY)
ALT: 20 U/L (ref 0–44)
AST: 26 U/L (ref 15–41)
Albumin: 3.1 g/dL — ABNORMAL LOW (ref 3.5–5.0)
Alkaline Phosphatase: 200 U/L — ABNORMAL HIGH (ref 38–126)
Anion gap: 9 (ref 5–15)
BUN: 15 mg/dL (ref 6–20)
CO2: 24 mmol/L (ref 22–32)
Calcium: 9.4 mg/dL (ref 8.9–10.3)
Chloride: 101 mmol/L (ref 98–111)
Creatinine: 1.16 mg/dL (ref 0.61–1.24)
GFR, Estimated: >60 mL/min (ref >60)
Glucose, Bld: 96 mg/dL (ref 70–99)
Potassium: 4.1 mmol/L (ref 3.5–5.1)
Sodium: 134 mmol/L — ABNORMAL LOW (ref 135–145)
Total Bilirubin: 0.4 mg/dL (ref 0.0–1.2)
Total Protein: 5.9 g/dL — ABNORMAL LOW (ref 6.5–8.1)

## 2023-10-04 MED ORDER — FLUOROURACIL CHEMO INJECTION 2.5 GM/50ML
400.0000 mg/m2 | Freq: Once | INTRAVENOUS | Status: AC
Start: 2023-10-04 — End: 2023-10-04
  Administered 2023-10-04: 750 mg via INTRAVENOUS
  Filled 2023-10-04: qty 15

## 2023-10-04 MED ORDER — SODIUM CHLORIDE 0.9 % IV SOLN
240.0000 mg | Freq: Once | INTRAVENOUS | Status: AC
  Administered 2023-10-04: 240 mg via INTRAVENOUS
  Filled 2023-10-04: qty 24

## 2023-10-04 MED ORDER — DEXAMETHASONE SODIUM PHOSPHATE 10 MG/ML IJ SOLN
10.0000 mg | Freq: Once | INTRAMUSCULAR | Status: AC
  Administered 2023-10-04: 10 mg via INTRAVENOUS
  Filled 2023-10-04: qty 1

## 2023-10-04 MED ORDER — DEXTROSE 5 % IV SOLN: INTRAVENOUS | Status: DC

## 2023-10-04 MED ORDER — OXALIPLATIN CHEMO INJECTION 100 MG/20ML
85.0000 mg/m2 | Freq: Once | INTRAVENOUS | Status: AC
Start: 2023-10-04 — End: 2023-10-04
  Administered 2023-10-04: 150 mg via INTRAVENOUS
  Filled 2023-10-04: qty 20

## 2023-10-04 MED ORDER — LEUCOVORIN CALCIUM INJECTION 350 MG
400.0000 mg/m2 | Freq: Once | INTRAVENOUS | Status: AC
  Administered 2023-10-04: 740 mg via INTRAVENOUS
  Filled 2023-10-04: qty 37

## 2023-10-04 MED ORDER — PALONOSETRON HCL INJECTION 0.25 MG/5ML
0.2500 mg | Freq: Once | INTRAVENOUS | Status: AC
  Administered 2023-10-04: 0.25 mg via INTRAVENOUS
  Filled 2023-10-04: qty 5

## 2023-10-04 MED ORDER — SODIUM CHLORIDE 0.9 % IV SOLN
2400.0000 mg/m2 | INTRAVENOUS | Status: DC
  Administered 2023-10-04: 4450 mg via INTRAVENOUS
  Filled 2023-10-04: qty 89

## 2023-10-04 NOTE — Progress Notes (Signed)
 Patients port flushed without difficulty.  Good blood return noted with no bruising or swelling noted at site.  Patient remains accessed for treatment.

## 2023-10-04 NOTE — Patient Instructions (Signed)
 [facility]  Discharge Instructions: Thank you for choosing [facility] to provide your oncology and hematology care.   If you have a lab appointment with the Cancer Center, please go directly to the Cancer Center and check in at the registration area.   Wear comfortable clothing and clothing appropriate for easy access to any Portacath or PICC line.   We strive to give you quality time with your provider. You may need to reschedule your appointment if you arrive late (15 or more minutes).  Arriving late affects you and other patients whose appointments are after yours.  Also, if you miss three or more appointments without notifying the office, you may be dismissed from the clinic at the provider's discretion.      For prescription refill requests, have your pharmacy contact our office and allow 72 hours for refills to be completed.    Today you received the following chemotherapy and/or immunotherapy agents OPDIVO, Oxaliplatin, Leucovorin, and Adrucil.  Nivolumab Injection What is this medication? NIVOLUMAB (nye VOL ue mab) treats some types of cancer. It works by helping your immune system slow or stop the spread of cancer cells. It is a monoclonal antibody. This medicine may be used for other purposes; ask your health care provider or pharmacist if you have questions. COMMON BRAND NAME(S): Opdivo What should I tell my care team before I take this medication? They need to know if you have any of these conditions: Allogeneic stem cell transplant (uses someone else's stem cells) Autoimmune diseases, such as Crohn disease, ulcerative colitis, lupus History of chest radiation Nervous system problems, such as Guillain-Barre syndrome or myasthenia gravis Organ transplant An unusual or allergic reaction to nivolumab, other medications, foods, dyes, or preservatives Pregnant or trying to get pregnant Breast-feeding How  should I use this medication? This medication is infused into a vein. It is given in a hospital or clinic setting. A special MedGuide will be given to you before each treatment. Be sure to read this information carefully each time. Talk to your care team about the use of this medication in children. While it may be prescribed for children as young as 12 years for selected conditions, precautions do apply. Overdosage: If you think you have taken too much of this medicine contact a poison control center or emergency room at once. NOTE: This medicine is only for you. Do not share this medicine with others. What if I miss a dose? Keep appointments for follow-up doses. It is important not to miss your dose. Call your care team if you are unable to keep an appointment. What may interact with this medication? Interactions have not been studied. This list may not describe all possible interactions. Give your health care provider a list of all the medicines, herbs, non-prescription drugs, or dietary supplements you use. Also tell them if you smoke, drink alcohol, or use illegal drugs. Some items may interact with your medicine. What should I watch for while using this medication? Your condition will be monitored carefully while you are receiving this medication. You may need blood work while taking this medication. This medication may cause serious skin reactions. They can happen weeks to months after starting the medication. Contact your care team right away if you notice fevers or flu-like symptoms with a rash. The rash may be red or purple and then turn into blisters or peeling of the skin. You may also notice a red  rash with swelling of the face, lips, or lymph nodes in your neck or under your arms. Tell your care team right away if you have any change in your eyesight. Talk to your care team if you are pregnant or think you might be pregnant. A negative pregnancy test is required before starting this  medication. A reliable form of contraception is recommended while taking this medication and for 5 months after the last dose. Talk to your care team about effective forms of contraception. Do not breast-feed while taking this medication and for 5 months after the last dose. What side effects may I notice from receiving this medication? Side effects that you should report to your care team as soon as possible: Allergic reactions--skin rash, itching, hives, swelling of the face, lips, tongue, or throat Dry cough, shortness of breath or trouble breathing Eye pain, redness, irritation, or discharge with blurry or decreased vision Heart muscle inflammation--unusual weakness or fatigue, shortness of breath, chest pain, fast or irregular heartbeat, dizziness, swelling of the ankles, feet, or hands Hormone gland problems--headache, sensitivity to light, unusual weakness or fatigue, dizziness, fast or irregular heartbeat, increased sensitivity to cold or heat, excessive sweating, constipation, hair loss, increased thirst or amount of urine, tremors or shaking, irritability Infusion reactions--chest pain, shortness of breath or trouble breathing, feeling faint or lightheaded Kidney injury (glomerulonephritis)--decrease in the amount of urine, red or dark brown urine, foamy or bubbly urine, swelling of the ankles, hands, or feet Liver injury--right upper belly pain, loss of appetite, nausea, light-colored stool, dark yellow or brown urine, yellowing skin or eyes, unusual weakness or fatigue Pain, tingling, or numbness in the hands or feet, muscle weakness, change in vision, confusion or trouble speaking, loss of balance or coordination, trouble walking, seizures Rash, fever, and swollen lymph nodes Redness, blistering, peeling, or loosening of the skin, including inside the mouth Sudden or severe stomach pain, bloody diarrhea, fever, nausea, vomiting Side effects that usually do not require medical attention  (report these to your care team if they continue or are bothersome): Bone, joint, or muscle pain Diarrhea Fatigue Loss of appetite Nausea Skin rash This list may not describe all possible side effects. Call your doctor for medical advice about side effects. You may report side effects to FDA at 1-800-FDA-1088. Where should I keep my medication? This medication is given in a hospital or clinic. It will not be stored at home. NOTE: This sheet is a summary. It may not cover all possible information. If you have questions about this medicine, talk to your doctor, pharmacist, or health care provider.  2024 Elsevier/Gold Standard (2021-11-22 00:00:00)  Oxaliplatin Injection What is this medication? OXALIPLATIN (ox AL i PLA tin) treats colorectal cancer. It works by slowing down the growth of cancer cells. This medicine may be used for other purposes; ask your health care provider or pharmacist if you have questions. COMMON BRAND NAME(S): Eloxatin What should I tell my care team before I take this medication? They need to know if you have any of these conditions: Heart disease History of irregular heartbeat or rhythm Liver disease Low blood cell levels (white cells, red cells, and platelets) Lung or breathing disease, such as asthma Take medications that treat or prevent blood clots Tingling of the fingers, toes, or other nerve disorder An unusual or allergic reaction to oxaliplatin, other medications, foods, dyes, or preservatives If you or your partner are pregnant or trying to get pregnant Breast-feeding How should I use this medication? This medication  is injected into a vein. It is given by your care team in a hospital or clinic setting. Talk to your care team about the use of this medication in children. Special care may be needed. Overdosage: If you think you have taken too much of this medicine contact a poison control center or emergency room at once. NOTE: This medicine is only  for you. Do not share this medicine with others. What if I miss a dose? Keep appointments for follow-up doses. It is important not to miss a dose. Call your care team if you are unable to keep an appointment. What may interact with this medication? Do not take this medication with any of the following: Cisapride Dronedarone Pimozide Thioridazine This medication may also interact with the following: Aspirin and aspirin-like medications Certain medications that treat or prevent blood clots, such as warfarin, apixaban, dabigatran, and rivaroxaban Cisplatin Cyclosporine Diuretics Medications for infection, such as acyclovir, adefovir, amphotericin B, bacitracin, cidofovir, foscarnet, ganciclovir, gentamicin, pentamidine, vancomycin NSAIDs, medications for pain and inflammation, such as ibuprofen or naproxen Other medications that cause heart rhythm changes Pamidronate Zoledronic acid This list may not describe all possible interactions. Give your health care provider a list of all the medicines, herbs, non-prescription drugs, or dietary supplements you use. Also tell them if you smoke, drink alcohol, or use illegal drugs. Some items may interact with your medicine. What should I watch for while using this medication? Your condition will be monitored carefully while you are receiving this medication. You may need blood work while taking this medication. This medication may make you feel generally unwell. This is not uncommon as chemotherapy can affect healthy cells as well as cancer cells. Report any side effects. Continue your course of treatment even though you feel ill unless your care team tells you to stop. This medication may increase your risk of getting an infection. Call your care team for advice if you get a fever, chills, sore throat, or other symptoms of a cold or flu. Do not treat yourself. Try to avoid being around people who are sick. Avoid taking medications that contain aspirin,  acetaminophen, ibuprofen, naproxen, or ketoprofen unless instructed by your care team. These medications may hide a fever. Be careful brushing or flossing your teeth or using a toothpick because you may get an infection or bleed more easily. If you have any dental work done, tell your dentist you are receiving this medication. This medication can make you more sensitive to cold. Do not drink cold drinks or use ice. Cover exposed skin before coming in contact with cold temperatures or cold objects. When out in cold weather wear warm clothing and cover your mouth and nose to warm the air that goes into your lungs. Tell your care team if you get sensitive to the cold. Talk to your care team if you or your partner are pregnant or think either of you might be pregnant. This medication can cause serious birth defects if taken during pregnancy and for 9 months after the last dose. A negative pregnancy test is required before starting this medication. A reliable form of contraception is recommended while taking this medication and for 9 months after the last dose. Talk to your care team about effective forms of contraception. Do not father a child while taking this medication and for 6 months after the last dose. Use a condom while having sex during this time period. Do not breastfeed while taking this medication and for 3 months after the last  dose. This medication may cause infertility. Talk to your care team if you are concerned about your fertility. What side effects may I notice from receiving this medication? Side effects that you should report to your care team as soon as possible: Allergic reactions--skin rash, itching, hives, swelling of the face, lips, tongue, or throat Bleeding--bloody or black, tar-like stools, vomiting blood or brown material that looks like coffee grounds, red or dark brown urine, small red or purple spots on skin, unusual bruising or bleeding Dry cough, shortness of breath or  trouble breathing Heart rhythm changes--fast or irregular heartbeat, dizziness, feeling faint or lightheaded, chest pain, trouble breathing Infection--fever, chills, cough, sore throat, wounds that don't heal, pain or trouble when passing urine, general feeling of discomfort or being unwell Liver injury--right upper belly pain, loss of appetite, nausea, light-colored stool, dark yellow or brown urine, yellowing skin or eyes, unusual weakness or fatigue Low red blood cell level--unusual weakness or fatigue, dizziness, headache, trouble breathing Muscle injury--unusual weakness or fatigue, muscle pain, dark yellow or brown urine, decrease in amount of urine Pain, tingling, or numbness in the hands or feet Sudden and severe headache, confusion, change in vision, seizures, which may be signs of posterior reversible encephalopathy syndrome (PRES) Unusual bruising or bleeding Side effects that usually do not require medical attention (report to your care team if they continue or are bothersome): Diarrhea Nausea Pain, redness, or swelling with sores inside the mouth or throat Unusual weakness or fatigue Vomiting This list may not describe all possible side effects. Call your doctor for medical advice about side effects. You may report side effects to FDA at 1-800-FDA-1088. Where should I keep my medication? This medication is given in a hospital or clinic. It will not be stored at home. NOTE: This sheet is a summary. It may not cover all possible information. If you have questions about this medicine, talk to your doctor, pharmacist, or health care provider.  2024 Elsevier/Gold Standard (2023-07-07 00:00:00)  Leucovorin Injection What is this medication? LEUCOVORIN (loo koe VOR in) prevents side effects from certain medications, such as methotrexate. It works by increasing folate levels. This helps protect healthy cells in your body. It may also be used to treat anemia caused by low levels of  folate. It can also be used with fluorouracil, a type of chemotherapy, to treat colorectal cancer. It works by increasing the effects of fluorouracil in the body. This medicine may be used for other purposes; ask your health care provider or pharmacist if you have questions. What should I tell my care team before I take this medication? They need to know if you have any of these conditions: Anemia from low levels of vitamin B12 in the blood An unusual or allergic reaction to leucovorin, folic acid, other medications, foods, dyes, or preservatives Pregnant or trying to get pregnant Breastfeeding How should I use this medication? This medication is injected into a vein or a muscle. It is given by your care team in a hospital or clinic setting. Talk to your care team about the use of this medication in children. Special care may be needed. Overdosage: If you think you have taken too much of this medicine contact a poison control center or emergency room at once. NOTE: This medicine is only for you. Do not share this medicine with others. What if I miss a dose? Keep appointments for follow-up doses. It is important not to miss your dose. Call your care team if you are unable to  keep an appointment. What may interact with this medication? Capecitabine Fluorouracil Phenobarbital Phenytoin Primidone Trimethoprim;sulfamethoxazole This list may not describe all possible interactions. Give your health care provider a list of all the medicines, herbs, non-prescription drugs, or dietary supplements you use. Also tell them if you smoke, drink alcohol, or use illegal drugs. Some items may interact with your medicine. What should I watch for while using this medication? Your condition will be monitored carefully while you are receiving this medication. This medication may increase the side effects of 5-fluorouracil. Tell your care team if you have diarrhea or mouth sores that do not get better or that get  worse. What side effects may I notice from receiving this medication? Side effects that you should report to your care team as soon as possible: Allergic reactions--skin rash, itching, hives, swelling of the face, lips, tongue, or throat This list may not describe all possible side effects. Call your doctor for medical advice about side effects. You may report side effects to FDA at 1-800-FDA-1088. Where should I keep my medication? This medication is given in a hospital or clinic. It will not be stored at home. NOTE: This sheet is a summary. It may not cover all possible information. If you have questions about this medicine, talk to your doctor, pharmacist, or health care provider.  2024 Elsevier/Gold Standard (2021-12-28 00:00:00)  Fluorouracil Injection What is this medication? FLUOROURACIL (flure oh YOOR a sil) treats some types of cancer. It works by slowing down the growth of cancer cells. This medicine may be used for other purposes; ask your health care provider or pharmacist if you have questions. COMMON BRAND NAME(S): Adrucil What should I tell my care team before I take this medication? They need to know if you have any of these conditions: Blood disorders Dihydropyrimidine dehydrogenase (DPD) deficiency Infection, such as chickenpox, cold sores, herpes Kidney disease Liver disease Poor nutrition Recent or ongoing radiation therapy An unusual or allergic reaction to fluorouracil, other medications, foods, dyes, or preservatives If you or your partner are pregnant or trying to get pregnant Breast-feeding How should I use this medication? This medication is injected into a vein. It is administered by your care team in a hospital or clinic setting. Talk to your care team about the use of this medication in children. Special care may be needed. Overdosage: If you think you have taken too much of this medicine contact a poison control center or emergency room at once. NOTE:  This medicine is only for you. Do not share this medicine with others. What if I miss a dose? Keep appointments for follow-up doses. It is important not to miss your dose. Call your care team if you are unable to keep an appointment. What may interact with this medication? Do not take this medication with any of the following: Live virus vaccines This medication may also interact with the following: Medications that treat or prevent blood clots, such as warfarin, enoxaparin, dalteparin This list may not describe all possible interactions. Give your health care provider a list of all the medicines, herbs, non-prescription drugs, or dietary supplements you use. Also tell them if you smoke, drink alcohol, or use illegal drugs. Some items may interact with your medicine. What should I watch for while using this medication? Your condition will be monitored carefully while you are receiving this medication. This medication may make you feel generally unwell. This is not uncommon as chemotherapy can affect healthy cells as well as cancer cells. Report any  side effects. Continue your course of treatment even though you feel ill unless your care team tells you to stop. In some cases, you may be given additional medications to help with side effects. Follow all directions for their use. This medication may increase your risk of getting an infection. Call your care team for advice if you get a fever, chills, sore throat, or other symptoms of a cold or flu. Do not treat yourself. Try to avoid being around people who are sick. This medication may increase your risk to bruise or bleed. Call your care team if you notice any unusual bleeding. Be careful brushing or flossing your teeth or using a toothpick because you may get an infection or bleed more easily. If you have any dental work done, tell your dentist you are receiving this medication. Avoid taking medications that contain aspirin, acetaminophen, ibuprofen,  naproxen, or ketoprofen unless instructed by your care team. These medications may hide a fever. Do not treat diarrhea with over the counter products. Contact your care team if you have diarrhea that lasts more than 2 days or if it is severe and watery. This medication can make you more sensitive to the sun. Keep out of the sun. If you cannot avoid being in the sun, wear protective clothing and sunscreen. Do not use sun lamps, tanning beds, or tanning booths. Talk to your care team if you or your partner wish to become pregnant or think you might be pregnant. This medication can cause serious birth defects if taken during pregnancy and for 3 months after the last dose. A reliable form of contraception is recommended while taking this medication and for 3 months after the last dose. Talk to your care team about effective forms of contraception. Do not father a child while taking this medication and for 3 months after the last dose. Use a condom while having sex during this time period. Do not breastfeed while taking this medication. This medication may cause infertility. Talk to your care team if you are concerned about your fertility. What side effects may I notice from receiving this medication? Side effects that you should report to your care team as soon as possible: Allergic reactions--skin rash, itching, hives, swelling of the face, lips, tongue, or throat Heart attack--pain or tightness in the chest, shoulders, arms, or jaw, nausea, shortness of breath, cold or clammy skin, feeling faint or lightheaded Heart failure--shortness of breath, swelling of the ankles, feet, or hands, sudden weight gain, unusual weakness or fatigue Heart rhythm changes--fast or irregular heartbeat, dizziness, feeling faint or lightheaded, chest pain, trouble breathing High ammonia level--unusual weakness or fatigue, confusion, loss of appetite, nausea, vomiting, seizures Infection--fever, chills, cough, sore throat,  wounds that don't heal, pain or trouble when passing urine, general feeling of discomfort or being unwell Low red blood cell level--unusual weakness or fatigue, dizziness, headache, trouble breathing Pain, tingling, or numbness in the hands or feet, muscle weakness, change in vision, confusion or trouble speaking, loss of balance or coordination, trouble walking, seizures Redness, swelling, and blistering of the skin over hands and feet Severe or prolonged diarrhea Unusual bruising or bleeding Side effects that usually do not require medical attention (report to your care team if they continue or are bothersome): Dry skin Headache Increased tears Nausea Pain, redness, or swelling with sores inside the mouth or throat Sensitivity to light Vomiting This list may not describe all possible side effects. Call your doctor for medical advice about side effects. You may report  side effects to FDA at 1-800-FDA-1088. Where should I keep my medication? This medication is given in a hospital or clinic. It will not be stored at home. NOTE: This sheet is a summary. It may not cover all possible information. If you have questions about this medicine, talk to your doctor, pharmacist, or health care provider.  2024 Elsevier/Gold Standard (2021-11-30 00:00:00)   To help prevent nausea and vomiting after your treatment, we encourage you to take your nausea medication as directed.  BELOW ARE SYMPTOMS THAT SHOULD BE REPORTED IMMEDIATELY: *FEVER GREATER THAN 100.4 F (38 C) OR HIGHER *CHILLS OR SWEATING *NAUSEA AND VOMITING THAT IS NOT CONTROLLED WITH YOUR NAUSEA MEDICATION *UNUSUAL SHORTNESS OF BREATH *UNUSUAL BRUISING OR BLEEDING *URINARY PROBLEMS (pain or burning when urinating, or frequent urination) *BOWEL PROBLEMS (unusual diarrhea, constipation, pain near the anus) TENDERNESS IN MOUTH AND THROAT WITH OR WITHOUT PRESENCE OF ULCERS (sore throat, sores in mouth, or a toothache) UNUSUAL RASH, SWELLING  OR PAIN  UNUSUAL VAGINAL DISCHARGE OR ITCHING   Items with * indicate a potential emergency and should be followed up as soon as possible or go to the Emergency Department if any problems should occur.  Please show the CHEMOTHERAPY ALERT CARD or IMMUNOTHERAPY ALERT CARD at check-in to the Emergency Department and triage nurse.  Should you have questions after your visit or need to cancel or reschedule your appointment, please contact [facility]  Dept: [phone]  and follow the prompts.  Office hours are 8:00 a.m. to 4:30 p.m. Monday - Friday. Please note that voicemails left after 4:00 p.m. may not be returned until the following business day.  We are closed weekends and major holidays. You have access to a nurse at all times for urgent questions. Please call the main number to the clinic Dept: [phone] and follow the prompts.   For any non-urgent questions, you may also contact your provider using MyChart. We now offer e-Visits for anyone 10 and older to request care online for non-urgent symptoms. For details visit [email or web address].   Also download the MyChart app! Go to the app store, search "MyChart", open the app, select [place], and log in with your MyChart username and password.

## 2023-10-04 NOTE — Progress Notes (Signed)
 Patient seen by Santiago Glad, NP today  Vitals are within treatment parameters:No (Please specify and give further instructions.) Blood Pressure 114/102, Decrease in 2 pounds, Okay to proceed Labs are within treatment parameters: Yes   Treatment plan has been signed: Yes   Per physician team, Patient is ready for treatment and there are NO modifications to the treatment plan.

## 2023-10-04 NOTE — Progress Notes (Addendum)
 Patient Care Team: Wanda Plump, MD as PCP - Jerelene Redden, MD as PCP - Cardiology (Cardiology) Axel Filler, MD as Consulting Physician (General Surgery) Wyline Mood, RN as VBCI Care Management   CHIEF COMPLAINT: Follow up gastric cancer   CURRENT THERAPY: FOLFOX/Nivo q2 weeks   INTERVAL HISTORY [name] returns for follow up and treatment as scheduled. Last seen by me 09/27/23 for cycle 4 which was postponed. He was neutropenic. He was admitted from clinic for intractable n/v. CT negative for gastric outlet obstruction. Symptoms improved after paracentesis.   Today he is with his daughter, feeling much better.  He has not vomited since hospital discharge, keeping more food and liquid down, but still having some trouble swallowing heavy solids.  Daughter thinks his voice is hoarse/raspy.  He is not taking the sucralfate. Taking 3-4 dilaudid per day. Denies pain currently.  Bowels moving.  He does not think the fluid has reaccumulated in the abdomen.  ROS  All other systems reviewed and negative  Past Medical History:  Diagnosis Date   Chest pain    stres test neg x 2, cath 5/12; minimal LAD irregs; no obs CAD; normal LVF   Clotting disorder (HCC)    dvt   Deep vein thrombosis (HCC) 10/06/2010   GERD (gastroesophageal reflux disease)    History of DVT of lower extremity    on chronic coumadin   Hypertension    Internal hemorrhoids    Cscope 2007   PAD (peripheral artery disease) (HCC)    a. left leg ischemia tx wtih embolectomy of left fem, pop, tib arteries with Dr. Edilia Bo - 08/2006;  b. known occlusion of right pop with collats - med Rx;  c.ABI's 8/09: R 1.0; L 0.91    PFO (patent foramen ovale)    per 08/2006 discharge summary, TEE showed EF 60%, small PFO with minimal right-to-left shunt with Valsalva; not felt to be the source of emboli, lifelong Coumadin recommended   Pneumonia    history of   Polysubstance abuse (HCC)    marijuana only   Renal  infarct [facility])    R     Past Surgical History:  Procedure Laterality Date   BIOPSY  07/18/2023   Procedure: BIOPSY;  Surgeon: Sherrilyn Rist, MD;  Location: [facility] ENDOSCOPY;  Service: Gastroenterology;;   CHOLECYSTECTOMY     COLONOSCOPY     ESOPHAGOGASTRODUODENOSCOPY (EGD) WITH PROPOFOL N/A 07/18/2023   Procedure: ESOPHAGOGASTRODUODENOSCOPY (EGD) WITH PROPOFOL;  Surgeon: Sherrilyn Rist, MD;  Location: [facility] ENDOSCOPY;  Service: Gastroenterology;  Laterality: N/A;   INGUINAL HERNIA REPAIR Left 07/16/2019   Procedure: LAPAROSCOPIC LEFT INGUINAL HERNIA REPAIR WITH MESH;  Surgeon: Axel Filler, MD;  Location: [facility] OR;  Service: General;  Laterality: Left;   IR IMAGING GUIDED PORT INSERTION  08/08/2023   IR PARACENTESIS  07/17/2023   left leg blood clot removal 2009  2009   TONSILLECTOMY     TOTAL HIP ARTHROPLASTY Left 02/03/2023   Procedure: LEFT TOTAL HIP ARTHROPLASTY ANTERIOR APPROACH;  Surgeon: Tarry Kos, MD;  Location: [facility] OR;  Service: Orthopedics;  Laterality: Left;  3-C   WRIST SURGERY Left      Outpatient Encounter Medications as of 10/04/2023  Medication Sig Note   aluminum-magnesium hydroxide-simethicone (MAALOX) 200-200-20 MG/5ML SUSP Take 15 mLs by mouth as needed (As needed).    apixaban (ELIQUIS) 5 MG TABS tablet Take 1 tablet by mouth twice daily (Patient taking differently: Take 5 mg by mouth  See admin instructions. Take 5 mg by mouth in the morning and afternoon)    HYDROmorphone (DILAUDID) 4 MG tablet Take 1-2 tablets (4-8 mg total) by mouth every 4 (four) hours as needed for severe pain (pain score 7-10).    lidocaine-prilocaine (EMLA) cream Apply 1 Application topically as needed (Apply to Port 1-2 hours prior to its use).    Nicotine (NICODERM CQ TD) Place 1 patch onto the skin daily as needed (for smoking cessation). 09/27/2023: PROVIDER: The patient's wife said (that) although he has not yet started using these at home, he will need a patch while hospitalized. She  described him as a "very light" smoker.   ondansetron (ZOFRAN) 8 MG tablet TAKE 1 TABLET BY MOUTH EVERY 8 HOURS AS NEEDED (START  3  DAYS  AFTER  CHEMO  AS  NEEDED  FOR  NAUSEA/VOMITING). (Patient taking differently: Take 8 mg by mouth every 8 (eight) hours as needed for nausea or vomiting (start 3 days after chemotherapy).)    pantoprazole (PROTONIX) 40 MG tablet Take 1 tablet (40 mg total) by mouth 2 (two) times daily.    potassium chloride (KLOR-CON M) 10 MEQ tablet Take 1 tablet (10 mEq total) by mouth 2 (two) times daily.    prochlorperazine (COMPAZINE) 10 MG tablet TAKE 1 TABLET BY MOUTH EVERY 6 HOURS AS NEEDED FOR NAUSEA FOR VOMITING (Patient taking differently: Take 10 mg by mouth every 6 (six) hours as needed for nausea or vomiting.)    EPINEPHrine (EPIPEN 2-PAK) 0.3 mg/0.3 mL IJ SOAJ injection Inject 0.3 mg into the muscle as needed for anaphylaxis. (Patient not taking: Reported on 10/04/2023)    polyethylene glycol (MIRALAX / GLYCOLAX) 17 g packet Take 17 g by mouth daily as needed for mild constipation. (Patient not taking: Reported on 10/04/2023)    sennosides-docusate sodium (SENOKOT-S) 8.6-50 MG tablet Take 2 tablets by mouth 2 (two) times daily as needed for constipation (hold for diarrhea). (Patient not taking: Reported on 10/04/2023)    sorbitol 70 % SOLN Take 15 ml by mouth every 6 hours until BM occurs as directed by physician.  Then take 15 ml daily as needed for constipation. (Patient not taking: Reported on 10/04/2023)    sucralfate (CARAFATE) 1 g tablet Take 1 tablet (1 g total) by mouth 4 (four) times daily -  with meals and at bedtime. Dissolve tablet in water to make liquid (Patient not taking: Reported on 10/04/2023)    No facility-administered encounter medications on file as of 10/04/2023.     Today's Vitals   10/04/23 0921 10/04/23 0929  BP: (!) 114/102   Pulse: 95   Resp: 18   Temp: 97.6 F (36.4 C)   TempSrc: Temporal   SpO2: 99%   Weight: 145 lb 6.4 oz (66 kg)    PainSc:  0-No pain   Body mass index is 19.18 kg/m.   PHYSICAL EXAM GENERAL:alert, no distress and comfortable SKIN: no rash  EYES: sclera clear LUNGS: clear with normal breathing effort HEART: regular rate & rhythm ABDOMEN: abdomen soft, non-tender and normal bowel sounds NEURO: alert & oriented x 3 with fluent speech, no focal motor/sensory deficits PAC without erythema    CBC    Component Value Date/Time   WBC 5.7 10/04/2023 0910   WBC 2.3 (L) 09/28/2023 0522   RBC 4.63 10/04/2023 0910   HGB 13.4 10/04/2023 0910   HGB 14.5 04/27/2021 0812   HCT 40.8 10/04/2023 0910   HCT 43.5 04/27/2021 0812   PLT  262 10/04/2023 0910   PLT 225 04/27/2021 0812   MCV 88.1 10/04/2023 0910   MCV 96 04/27/2021 0812   MCH 28.9 10/04/2023 0910   MCHC 32.8 10/04/2023 0910   RDW 14.4 10/04/2023 0910   RDW 13.7 04/27/2021 0812   LYMPHSABS 1.8 10/04/2023 0910   MONOABS 0.7 10/04/2023 0910   EOSABS 0.1 10/04/2023 0910   BASOSABS 0.0 10/04/2023 0910     CMP     Component Value Date/Time   NA 134 (L) 10/04/2023 0910   NA 139 05/24/2022 0926   K 4.1 10/04/2023 0910   CL 101 10/04/2023 0910   CO2 24 10/04/2023 0910   GLUCOSE 96 10/04/2023 0910   BUN 15 10/04/2023 0910   BUN 11 05/24/2022 0926   CREATININE 1.16 10/04/2023 0910   CALCIUM 9.4 10/04/2023 0910   PROT 5.9 (L) 10/04/2023 0910   PROT 6.8 08/30/2022 1006   ALBUMIN 3.1 (L) 10/04/2023 0910   ALBUMIN 4.5 08/30/2022 1006   AST 26 10/04/2023 0910   ALT 20 10/04/2023 0910   ALKPHOS 200 (H) 10/04/2023 0910   BILITOT 0.4 10/04/2023 0910   GFRNONAA >60 10/04/2023 0910   GFRAA >60 07/11/2019 0920     ASSESSMENT & PLAN:  Gastric cancer, stage IV CT abdomen/pelvis 07/16/2023-increased wall thickening at the gastric antrum and pylorus compared to 2016, small to moderate ascites, subcapsular hypodensities at the inferior liver-subcapsular implants?,  Diffuse soft tissue infiltration and haziness throughout the omentum and upper  abdominal peritoneal fat Endoscopy 07/18/2023-congested mucosa in the gastric antrum and biopsy biopsied: 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 Paracentesis 07/17/2023-acute inflammation, no malignant cells CT biopsy of right upper quadrant omental thickening 07/19/2023: Metastatic total differentiated carcinoma consistent with primary gastric carcinoma, normal mismatch repair protein expression , HER2 (0); foundation 1-microsatellite stable, tumor mutation burden 2, PD-L1 CPS 1; [facility] PD-L1 CPS 60% Cycle 1 FOLFOX/nivolumab 08/16/2023 Cycle 2 FOLFOX/nivolumab 08/30/2023 Cycle 3 FOLFOX/nivolumab 09/13/2023 CT abdomen/pelvis 09/28/2023: Large volume ascites, difficult to assess omentum, decreased conspicuity of hepatic hypodensities, nonspecific thickening at the pyloric region of the stomach Cycle 4 FOLFOX/nivolumab 10/04/2023, GCSF added   Pain secondary to #1 History of recurrent venous thrombosis-maintained on apixaban anticoagulation Tobacco and alcohol use Family history of multiple cancers Anorexia/weight loss and constipation secondary to #1 Left hip replacement June 2024 Hypokalemia, secondary to poor nutrition and n/v. Currently takes oral KCL BID Admission 09/27/23 - 09/29/23 for intractable N/V, negative for gastric outlet or small bowel obstruction. CT showed no disease progression. Sx improved after paracentesis (5 L)   Disposition:  [name] appears improved. He has recovered from hospitalization. Weight loss has stabilized, he is tolerating soft diet. Pain appears controlled. PS adequate to resume chemo, we reviewed ways to improve fatigue.   Labs reviewed, neutropenia has resolved. CBC/CMP adequate to proceed with cycle 4 FOLFOX/nivo today, no dose adjustments. He will receive GCSF on day 3, we reviewed potential risk/benefit, side effects, and symptom management. He agrees to proceed.   Follow up and cycle 5 in 2 weeks, or sooner if needed. He  knows to call clinic if he needs another paracentesis. Patient seen with Dr. Truett Perna.    All questions were answered. The patient knows to call the clinic with any problems, questions or concerns. No barriers to learning were detected.   Anthony Glad, NP-C 10/04/2023 This was a shared visit with Michaele Offer.  [name] was interviewed and examined.  His performance status appears improved following discharge from the hospital.  He will complete another cycle of FOLFOX/nivolumab today.  There is no clinical evidence of progressive gastric cancer.  He will return for an office visit and cycle 5 chemotherapy in 2 weeks.  There was no radiologic evidence of disease progression on a CT 09/28/2023.  We will plan for a restaging CT after cycle 6 or cycle 7.  I was present for greater than 50% of today's visit.  I performed medical decision making.  Mancel Bale, MD

## 2023-10-04 NOTE — Progress Notes (Signed)
 Patient presents today for chemotherapy infusion. Patient is in satisfactory condition with no new complaints voiced.  Vital signs are stable with exception of blood pressure 114/102-rechecked prior to treatment start 119/92.  Labs reviewed by Santiago Glad NP during the office visit and all labs are within treatment parameters. Per Santiago Glad NP okay to treat. We will proceed with treatment per MD orders.    Patient tolerated treatment well with no complaints voiced.  Patient left ambulatory in stable condition.  Vital signs stable at discharge.  Follow up as scheduled.

## 2023-10-04 NOTE — Progress Notes (Signed)
 Nutrition follow up completed with patient during infusion for Gastric cancer. He is followed by Dr. Truett Perna.  Weight documented as 145 pounds 6.4 oz on Feb 26 from 146 pounds  15.7 oz Feb 19.  Labs include Na 134 and Albumin 3.1.  Patient feels much better since last visit. He was admitted for intractable n/v. Symptoms improved after paracentesis/5 L removed. Reports no vomiting since hospital discharge. Had a small BM this morning. He has tolerated soft foods such as Jello, boiled chicken wings and gravy, chicken pot pie, mashed potatoes, applesauce and chicken noodle soup. Declines a snack in infusion room.  Nutrition Diagnosis: Unintended wt loss, ongoing.  Intervention: Educated to eat small amounts every 2 hours and try ONS to increase calories and protein and improve wt loss. Bowel regimen. Experiment with small amounts of favorite foods and assess tolerance.  Monitoring, Evaluation, Goals: Will tolerate increased calories and protein to minimize loss of lean body mass.  Next Visit: Wednesday, March 12, during infusion.

## 2023-10-05 ENCOUNTER — Telehealth: Payer: Self-pay | Admitting: Genetic Counselor

## 2023-10-05 ENCOUNTER — Inpatient Hospital Stay: Payer: Medicaid Other

## 2023-10-05 VITALS — BP 107/86 | HR 79 | Temp 98.1°F | Resp 17

## 2023-10-05 DIAGNOSIS — Z5111 Encounter for antineoplastic chemotherapy: Secondary | ICD-10-CM | POA: Diagnosis not present

## 2023-10-05 DIAGNOSIS — C163 Malignant neoplasm of pyloric antrum: Secondary | ICD-10-CM

## 2023-10-05 MED ORDER — SODIUM CHLORIDE 0.9 % IV SOLN
3000.0000 mg | INTRAVENOUS | Status: DC
  Administered 2023-10-05: 3000 mg via INTRAVENOUS
  Filled 2023-10-05: qty 60

## 2023-10-05 NOTE — Progress Notes (Signed)
 Patient arrived to the clinic this morning in the company of his wife complaining that his port access had become dislodged during the night. He explained that he had his portable chemo pump sitting on his night stand while he slept and it accidentally fell dislodging the needle from his port. Patient's skin to his chest was washed with warm water and soap, rinsed and patted dry. No discoloration, or skin irritation noted. He was re-accessed with no complain or concerns. Pump administration showed 54.40 ml given and 95.6 ml balance volume on pump. Per Misty Stanley, NP's orders, a new bag was started to run over 30 hours at 5 ml/hr. Patient was discharged without any complain or concerns at this time.

## 2023-10-05 NOTE — Patient Instructions (Signed)
 [facility]   Discharge Instructions: Thank you for choosing [facility] to provide your oncology and hematology care.   If you have a lab appointment with the Cancer Center, please go directly to the Cancer Center and check in at the registration area.   Wear comfortable clothing and clothing appropriate for easy access to any Portacath or PICC line.   We strive to give you quality time with your provider. You may need to reschedule your appointment if you arrive late (15 or more minutes).  Arriving late affects you and other patients whose appointments are after yours.  Also, if you miss three or more appointments without notifying the office, you may be dismissed from the clinic at the provider's discretion.      For prescription refill requests, have your pharmacy contact our office and allow 72 hours for refills to be completed.    Today you received the following chemotherapy and/or immunotherapy agents Flourouracil (ADRUCIL).      To help prevent nausea and vomiting after your treatment, we encourage you to take your nausea medication as directed.  BELOW ARE SYMPTOMS THAT SHOULD BE REPORTED IMMEDIATELY: *FEVER GREATER THAN 100.4 F (38 C) OR HIGHER *CHILLS OR SWEATING *NAUSEA AND VOMITING THAT IS NOT CONTROLLED WITH YOUR NAUSEA MEDICATION *UNUSUAL SHORTNESS OF BREATH *UNUSUAL BRUISING OR BLEEDING *URINARY PROBLEMS (pain or burning when urinating, or frequent urination) *BOWEL PROBLEMS (unusual diarrhea, constipation, pain near the anus) TENDERNESS IN MOUTH AND THROAT WITH OR WITHOUT PRESENCE OF ULCERS (sore throat, sores in mouth, or a toothache) UNUSUAL RASH, SWELLING OR PAIN  UNUSUAL VAGINAL DISCHARGE OR ITCHING   Items with * indicate a potential emergency and should be followed up as soon as possible or go to the Emergency Department if any problems should occur.  Please show the CHEMOTHERAPY ALERT CARD or  IMMUNOTHERAPY ALERT CARD at check-in to the Emergency Department and triage nurse.  Should you have questions after your visit or need to cancel or reschedule your appointment, please contact [facility]  Dept: [phone]  and follow the prompts.  Office hours are 8:00 a.m. to 4:30 p.m. Monday - Friday. Please note that voicemails left after 4:00 p.m. may not be returned until the following business day.  We are closed weekends and major holidays. You have access to a nurse at all times for urgent questions. Please call the main number to the clinic Dept: [phone] and follow the prompts.   For any non-urgent questions, you may also contact your provider using MyChart. We now offer e-Visits for anyone 71 and older to request care online for non-urgent symptoms. For details visit [email or web address].   Also download the MyChart app! Go to the app store, search "MyChart", open the app, select [place], and log in with your MyChart username and password.  Fluorouracil Injection What is this medication? FLUOROURACIL (flure oh YOOR a sil) treats some types of cancer. It works by slowing down the growth of cancer cells. This medicine may be used for other purposes; ask your health care provider or pharmacist if you have questions. COMMON BRAND NAME(S): Adrucil What should I tell my care team before I take this medication? They need to know if you have any of these conditions: Blood disorders Dihydropyrimidine dehydrogenase (DPD) deficiency Infection, such as chickenpox, cold sores, herpes Kidney disease Liver disease Poor nutrition Recent or  ongoing radiation therapy An unusual or allergic reaction to fluorouracil, other medications, foods, dyes, or preservatives If you or your partner are pregnant or trying to get pregnant Breast-feeding How should I use this medication? This medication is injected into a vein. It is  administered by your care team in a hospital or clinic setting. Talk to your care team about the use of this medication in children. Special care may be needed. Overdosage: If you think you have taken too much of this medicine contact a poison control center or emergency room at once. NOTE: This medicine is only for you. Do not share this medicine with others. What if I miss a dose? Keep appointments for follow-up doses. It is important not to miss your dose. Call your care team if you are unable to keep an appointment. What may interact with this medication? Do not take this medication with any of the following: Live virus vaccines This medication may also interact with the following: Medications that treat or prevent blood clots, such as warfarin, enoxaparin, dalteparin This list may not describe all possible interactions. Give your health care provider a list of all the medicines, herbs, non-prescription drugs, or dietary supplements you use. Also tell them if you smoke, drink alcohol, or use illegal drugs. Some items may interact with your medicine. What should I watch for while using this medication? Your condition will be monitored carefully while you are receiving this medication. This medication may make you feel generally unwell. This is not uncommon as chemotherapy can affect healthy cells as well as cancer cells. Report any side effects. Continue your course of treatment even though you feel ill unless your care team tells you to stop. In some cases, you may be given additional medications to help with side effects. Follow all directions for their use. This medication may increase your risk of getting an infection. Call your care team for advice if you get a fever, chills, sore throat, or other symptoms of a cold or flu. Do not treat yourself. Try to avoid being around people who are sick. This medication may increase your risk to bruise or bleed. Call your care team if you notice any  unusual bleeding. Be careful brushing or flossing your teeth or using a toothpick because you may get an infection or bleed more easily. If you have any dental work done, tell your dentist you are receiving this medication. Avoid taking medications that contain aspirin, acetaminophen, ibuprofen, naproxen, or ketoprofen unless instructed by your care team. These medications may hide a fever. Do not treat diarrhea with over the counter products. Contact your care team if you have diarrhea that lasts more than 2 days or if it is severe and watery. This medication can make you more sensitive to the sun. Keep out of the sun. If you cannot avoid being in the sun, wear protective clothing and sunscreen. Do not use sun lamps, tanning beds, or tanning booths. Talk to your care team if you or your partner wish to become pregnant or think you might be pregnant. This medication can cause serious birth defects if taken during pregnancy and for 3 months after the last dose. A reliable form of contraception is recommended while taking this medication and for 3 months after the last dose. Talk to your care team about effective forms of contraception. Do not father a child while taking this medication and for 3 months after the last dose. Use a condom while having sex during this time period.  Do not breastfeed while taking this medication. This medication may cause infertility. Talk to your care team if you are concerned about your fertility. What side effects may I notice from receiving this medication? Side effects that you should report to your care team as soon as possible: Allergic reactions--skin rash, itching, hives, swelling of the face, lips, tongue, or throat Heart attack--pain or tightness in the chest, shoulders, arms, or jaw, nausea, shortness of breath, cold or clammy skin, feeling faint or lightheaded Heart failure--shortness of breath, swelling of the ankles, feet, or hands, sudden weight gain, unusual  weakness or fatigue Heart rhythm changes--fast or irregular heartbeat, dizziness, feeling faint or lightheaded, chest pain, trouble breathing High ammonia level--unusual weakness or fatigue, confusion, loss of appetite, nausea, vomiting, seizures Infection--fever, chills, cough, sore throat, wounds that don't heal, pain or trouble when passing urine, general feeling of discomfort or being unwell Low red blood cell level--unusual weakness or fatigue, dizziness, headache, trouble breathing Pain, tingling, or numbness in the hands or feet, muscle weakness, change in vision, confusion or trouble speaking, loss of balance or coordination, trouble walking, seizures Redness, swelling, and blistering of the skin over hands and feet Severe or prolonged diarrhea Unusual bruising or bleeding Side effects that usually do not require medical attention (report to your care team if they continue or are bothersome): Dry skin Headache Increased tears Nausea Pain, redness, or swelling with sores inside the mouth or throat Sensitivity to light Vomiting This list may not describe all possible side effects. Call your doctor for medical advice about side effects. You may report side effects to FDA at 1-800-FDA-1088. Where should I keep my medication? This medication is given in a hospital or clinic. It will not be stored at home. NOTE: This sheet is a summary. It may not cover all possible information. If you have questions about this medicine, talk to your doctor, pharmacist, or health care provider.  2024 Elsevier/Gold Standard (2021-11-30 00:00:00)  The chemotherapy medication bag should finish at 30 hours. For example, if your pump is scheduled for 46 hours and it was put on at 4:00 p.m., it should finish at 2:00 p.m. the day it is scheduled to come off regardless of your appointment time.     Estimated time to finish at 4:30 p.m. on Friday 10/06/2023.   If the display on your pump reads "Low Volume" and  it is beeping, take the batteries out of the pump and come to the cancer center for it to be taken off.   If the pump alarms go off prior to the pump reading "Low Volume" then call [phone] and someone can assist you.  If the plunger comes out and the chemotherapy medication is leaking out, please use your home chemo spill kit to clean up the spill. Do NOT use paper towels or other household products.  If you have problems or questions regarding your pump, please call either [phone] (24 hours a day) or the cancer center Monday-Friday 8:00 a.m.- 4:30 p.m. at the clinic number and we will assist you. If you are unable to get assistance, then go to the nearest Emergency Department and ask the staff to contact the IV team for assistance.

## 2023-10-05 NOTE — Telephone Encounter (Signed)
 Called patient to schedule Genetics appt. Patient's mailbox is full.

## 2023-10-06 ENCOUNTER — Inpatient Hospital Stay: Payer: 59

## 2023-10-06 VITALS — BP 104/84 | HR 87 | Temp 98.4°F | Resp 18

## 2023-10-06 DIAGNOSIS — C163 Malignant neoplasm of pyloric antrum: Secondary | ICD-10-CM

## 2023-10-06 DIAGNOSIS — Z5111 Encounter for antineoplastic chemotherapy: Secondary | ICD-10-CM | POA: Diagnosis not present

## 2023-10-06 MED ORDER — PEGFILGRASTIM-JMDB 6 MG/0.6ML ~~LOC~~ SOSY
6.0000 mg | PREFILLED_SYRINGE | Freq: Once | SUBCUTANEOUS | Status: AC
  Administered 2023-10-06: 6 mg via SUBCUTANEOUS
  Filled 2023-10-06: qty 0.6

## 2023-10-06 MED ORDER — SODIUM CHLORIDE 0.9% FLUSH
10.0000 mL | INTRAVENOUS | Status: DC | PRN
  Administered 2023-10-06: 10 mL

## 2023-10-06 MED ORDER — HEPARIN SOD (PORK) LOCK FLUSH 100 UNIT/ML IV SOLN
500.0000 [IU] | Freq: Once | INTRAVENOUS | Status: AC | PRN
  Administered 2023-10-06: 500 [IU]

## 2023-10-06 NOTE — Patient Instructions (Signed)

## 2023-10-11 ENCOUNTER — Other Ambulatory Visit: Payer: 59

## 2023-10-11 ENCOUNTER — Other Ambulatory Visit: Payer: Self-pay | Admitting: Oncology

## 2023-10-11 ENCOUNTER — Ambulatory Visit: Payer: 59

## 2023-10-11 ENCOUNTER — Ambulatory Visit: Payer: 59 | Admitting: Nurse Practitioner

## 2023-10-12 ENCOUNTER — Telehealth: Payer: Self-pay | Admitting: Genetic Counselor

## 2023-10-12 ENCOUNTER — Other Ambulatory Visit: Payer: Self-pay | Admitting: Nurse Practitioner

## 2023-10-12 ENCOUNTER — Telehealth: Payer: Self-pay | Admitting: *Deleted

## 2023-10-12 DIAGNOSIS — C163 Malignant neoplasm of pyloric antrum: Secondary | ICD-10-CM

## 2023-10-12 MED ORDER — HYDROMORPHONE HCL 4 MG PO TABS: 4.0000 mg | ORAL_TABLET | ORAL | 0 refills | Status: DC | PRN

## 2023-10-12 NOTE — Telephone Encounter (Signed)
 Mr. Maggio left VM requesting refill on his Dilaudid to [facility].

## 2023-10-12 NOTE — Telephone Encounter (Signed)
 [name]

## 2023-10-14 ENCOUNTER — Telehealth: Payer: Self-pay | Admitting: Genetic Counselor

## 2023-10-14 DIAGNOSIS — C169 Malignant neoplasm of stomach, unspecified: Secondary | ICD-10-CM | POA: Diagnosis not present

## 2023-10-14 NOTE — Telephone Encounter (Signed)
 Patient is  aware of scheduled appointment times/dates for appointment writhe Genetics Counselor

## 2023-10-17 NOTE — Progress Notes (Unsigned)
 Patient Care Team: Wanda Plump, MD as PCP - Jerelene Redden, MD as PCP - Cardiology (Cardiology) Axel Filler, MD as Consulting Physician (General Surgery) Wyline Mood, RN (Inactive) as VBCI Care Management   CHIEF COMPLAINT: Follow up gastric cancer   CURRENT THERAPY: FOLFOX/Nivo q2 weeks  INTERVAL HISTORY [name] returns for follow up and treatment as scheduled. Last seen by me 10/04/23 with cycle 4. He received GCSF.   ROS   Past Medical History:  Diagnosis Date   Chest pain    stres test neg x 2, cath 5/12; minimal LAD irregs; no obs CAD; normal LVF   Clotting disorder (HCC)    dvt   Deep vein thrombosis (HCC) 10/06/2010   GERD (gastroesophageal reflux disease)    History of DVT of lower extremity    on chronic coumadin   Hypertension    Internal hemorrhoids    Cscope 2007   PAD (peripheral artery disease) (HCC)    a. left leg ischemia tx wtih embolectomy of left fem, pop, tib arteries with Dr. Edilia Bo - 08/2006;  b. known occlusion of right pop with collats - med Rx;  c.ABI's 8/09: R 1.0; L 0.91    PFO (patent foramen ovale)    per 08/2006 discharge summary, TEE showed EF 60%, small PFO with minimal right-to-left shunt with Valsalva; not felt to be the source of emboli, lifelong Coumadin recommended   Pneumonia    history of   Polysubstance abuse (HCC)    marijuana only   Renal infarct [facility])    R     Past Surgical History:  Procedure Laterality Date   BIOPSY  07/18/2023   Procedure: BIOPSY;  Surgeon: Sherrilyn Rist, MD;  Location: [facility] ENDOSCOPY;  Service: Gastroenterology;;   CHOLECYSTECTOMY     COLONOSCOPY     ESOPHAGOGASTRODUODENOSCOPY (EGD) WITH PROPOFOL N/A 07/18/2023   Procedure: ESOPHAGOGASTRODUODENOSCOPY (EGD) WITH PROPOFOL;  Surgeon: Sherrilyn Rist, MD;  Location: [facility] ENDOSCOPY;  Service: Gastroenterology;  Laterality: N/A;   INGUINAL HERNIA REPAIR Left 07/16/2019   Procedure: LAPAROSCOPIC LEFT INGUINAL HERNIA REPAIR WITH  MESH;  Surgeon: Axel Filler, MD;  Location: [facility] OR;  Service: General;  Laterality: Left;   IR IMAGING GUIDED PORT INSERTION  08/08/2023   IR PARACENTESIS  07/17/2023   left leg blood clot removal 2009  2009   TONSILLECTOMY     TOTAL HIP ARTHROPLASTY Left 02/03/2023   Procedure: LEFT TOTAL HIP ARTHROPLASTY ANTERIOR APPROACH;  Surgeon: Tarry Kos, MD;  Location: [facility] OR;  Service: Orthopedics;  Laterality: Left;  3-C   WRIST SURGERY Left      Outpatient Encounter Medications as of 10/18/2023  Medication Sig Note   aluminum-magnesium hydroxide-simethicone (MAALOX) 200-200-20 MG/5ML SUSP Take 15 mLs by mouth as needed (As needed).    apixaban (ELIQUIS) 5 MG TABS tablet Take 1 tablet by mouth twice daily (Patient taking differently: Take 5 mg by mouth See admin instructions. Take 5 mg by mouth in the morning and afternoon)    EPINEPHrine (EPIPEN 2-PAK) 0.3 mg/0.3 mL IJ SOAJ injection Inject 0.3 mg into the muscle as needed for anaphylaxis. (Patient not taking: Reported on 10/04/2023)    HYDROmorphone (DILAUDID) 4 MG tablet Take 1-2 tablets (4-8 mg total) by mouth every 4 (four) hours as needed for severe pain (pain score 7-10).    lidocaine-prilocaine (EMLA) cream Apply 1 Application topically as needed (Apply to Port 1-2 hours prior to its use).    Nicotine (NICODERM CQ  TD) Place 1 patch onto the skin daily as needed (for smoking cessation). 09/27/2023: PROVIDER: The patient's wife said (that) although he has not yet started using these at home, he will need a patch while hospitalized. She described him as a "very light" smoker.   ondansetron (ZOFRAN) 8 MG tablet TAKE 1 TABLET BY MOUTH EVERY 8 HOURS AS NEEDED (START  3  DAYS  AFTER  CHEMO  AS  NEEDED  FOR  NAUSEA/VOMITING). (Patient taking differently: Take 8 mg by mouth every 8 (eight) hours as needed for nausea or vomiting (start 3 days after chemotherapy).)    pantoprazole (PROTONIX) 40 MG tablet Take 1 tablet (40 mg total) by mouth 2 (two) times  daily.    polyethylene glycol (MIRALAX / GLYCOLAX) 17 g packet Take 17 g by mouth daily as needed for mild constipation. (Patient not taking: Reported on 10/04/2023)    potassium chloride (KLOR-CON M) 10 MEQ tablet Take 1 tablet (10 mEq total) by mouth 2 (two) times daily.    prochlorperazine (COMPAZINE) 10 MG tablet TAKE 1 TABLET BY MOUTH EVERY 6 HOURS AS NEEDED FOR NAUSEA FOR VOMITING (Patient taking differently: Take 10 mg by mouth every 6 (six) hours as needed for nausea or vomiting.)    sennosides-docusate sodium (SENOKOT-S) 8.6-50 MG tablet Take 2 tablets by mouth 2 (two) times daily as needed for constipation (hold for diarrhea). (Patient not taking: Reported on 10/04/2023)    sorbitol 70 % SOLN Take 15 ml by mouth every 6 hours until BM occurs as directed by physician.  Then take 15 ml daily as needed for constipation. (Patient not taking: Reported on 10/04/2023)    sucralfate (CARAFATE) 1 g tablet Take 1 tablet (1 g total) by mouth 4 (four) times daily -  with meals and at bedtime. Dissolve tablet in water to make liquid (Patient not taking: Reported on 10/04/2023)    No facility-administered encounter medications on file as of 10/18/2023.     There were no vitals filed for this visit. There is no height or weight on file to calculate BMI.   PHYSICAL EXAM GENERAL:alert, no distress and comfortable SKIN: no rash  EYES: sclera clear NECK: without mass LYMPH:  no palpable cervical or supraclavicular lymphadenopathy  LUNGS: clear with normal breathing effort HEART: regular rate & rhythm, no lower extremity edema ABDOMEN: abdomen soft, non-tender and normal bowel sounds NEURO: alert & oriented x 3 with fluent speech, no focal motor/sensory deficits Breast exam:  PAC without erythema    CBC    Component Value Date/Time   WBC 5.7 10/04/2023 0910   WBC 2.3 (L) 09/28/2023 0522   RBC 4.63 10/04/2023 0910   HGB 13.4 10/04/2023 0910   HGB 14.5 04/27/2021 0812   HCT 40.8 10/04/2023 0910    HCT 43.5 04/27/2021 0812   PLT 262 10/04/2023 0910   PLT 225 04/27/2021 0812   MCV 88.1 10/04/2023 0910   MCV 96 04/27/2021 0812   MCH 28.9 10/04/2023 0910   MCHC 32.8 10/04/2023 0910   RDW 14.4 10/04/2023 0910   RDW 13.7 04/27/2021 0812   LYMPHSABS 1.8 10/04/2023 0910   MONOABS 0.7 10/04/2023 0910   EOSABS 0.1 10/04/2023 0910   BASOSABS 0.0 10/04/2023 0910     CMP     Component Value Date/Time   NA 134 (L) 10/04/2023 0910   NA 139 05/24/2022 0926   K 4.1 10/04/2023 0910   CL 101 10/04/2023 0910   CO2 24 10/04/2023 0910   GLUCOSE 96  10/04/2023 0910   BUN 15 10/04/2023 0910   BUN 11 05/24/2022 0926   CREATININE 1.16 10/04/2023 0910   CALCIUM 9.4 10/04/2023 0910   PROT 5.9 (L) 10/04/2023 0910   PROT 6.8 08/30/2022 1006   ALBUMIN 3.1 (L) 10/04/2023 0910   ALBUMIN 4.5 08/30/2022 1006   AST 26 10/04/2023 0910   ALT 20 10/04/2023 0910   ALKPHOS 200 (H) 10/04/2023 0910   BILITOT 0.4 10/04/2023 0910   GFRNONAA >60 10/04/2023 0910   GFRAA >60 07/11/2019 0920     ASSESSMENT & PLAN:  Gastric cancer, stage IV CT abdomen/pelvis 07/16/2023-increased wall thickening at the gastric antrum and pylorus compared to 2016, small to moderate ascites, subcapsular hypodensities at the inferior liver-subcapsular implants?,  Diffuse soft tissue infiltration and haziness throughout the omentum and upper abdominal peritoneal fat Endoscopy 07/18/2023-congested mucosa in the gastric antrum and biopsy biopsied: 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 Paracentesis 07/17/2023-acute inflammation, no malignant cells CT biopsy of right upper quadrant omental thickening 07/19/2023: Metastatic total differentiated carcinoma consistent with primary gastric carcinoma, normal mismatch repair protein expression , HER2 (0); foundation 1-microsatellite stable, tumor mutation burden 2, PD-L1 CPS 1; [place] PD-L1 CPS 60% Cycle 1 FOLFOX/nivolumab 08/16/2023 Cycle 2 FOLFOX/nivolumab  08/30/2023 Cycle 3 FOLFOX/nivolumab 09/13/2023 CT abdomen/pelvis 09/28/2023: Large volume ascites, difficult to assess omentum, decreased conspicuity of hepatic hypodensities, nonspecific thickening at the pyloric region of the stomach Cycle 4 FOLFOX/nivolumab 10/04/2023, GCSF added   Pain secondary to #1 History of recurrent venous thrombosis-maintained on apixaban anticoagulation Tobacco and alcohol use Family history of multiple cancers Anorexia/weight loss and constipation secondary to #1 Left hip replacement June 2024 Hypokalemia, secondary to poor nutrition and n/v. Currently takes oral KCL BID Admission 09/27/23 - 09/29/23 for intractable N/V, negative for gastric outlet or small bowel obstruction. CT showed no disease progression. Sx improved after paracentesis (5 L)     PLAN:  No orders of the defined types were placed in this encounter.     All questions were answered. The patient knows to call the clinic with any problems, questions or concerns. No barriers to learning were detected. I spent *** counseling the patient face to face. The total time spent in the appointment was *** and more than 50% was on counseling, review of test results, and coordination of care.   Santiago Glad, NP-C @DATE @

## 2023-10-18 ENCOUNTER — Other Ambulatory Visit: Payer: Self-pay

## 2023-10-18 ENCOUNTER — Encounter: Payer: Self-pay | Admitting: Nurse Practitioner

## 2023-10-18 ENCOUNTER — Inpatient Hospital Stay: Payer: 59 | Attending: Oncology

## 2023-10-18 ENCOUNTER — Telehealth: Payer: Self-pay | Admitting: *Deleted

## 2023-10-18 ENCOUNTER — Inpatient Hospital Stay (HOSPITAL_BASED_OUTPATIENT_CLINIC_OR_DEPARTMENT_OTHER): Payer: 59 | Admitting: Nurse Practitioner

## 2023-10-18 ENCOUNTER — Inpatient Hospital Stay: Payer: 59

## 2023-10-18 ENCOUNTER — Inpatient Hospital Stay: Payer: 59 | Admitting: Nutrition

## 2023-10-18 VITALS — BP 114/89 | HR 102 | Temp 98.2°F | Resp 18 | Ht 73.0 in | Wt 137.6 lb

## 2023-10-18 DIAGNOSIS — K5900 Constipation, unspecified: Secondary | ICD-10-CM | POA: Insufficient documentation

## 2023-10-18 DIAGNOSIS — Z86718 Personal history of other venous thrombosis and embolism: Secondary | ICD-10-CM | POA: Insufficient documentation

## 2023-10-18 DIAGNOSIS — Z5111 Encounter for antineoplastic chemotherapy: Secondary | ICD-10-CM | POA: Diagnosis present

## 2023-10-18 DIAGNOSIS — C163 Malignant neoplasm of pyloric antrum: Secondary | ICD-10-CM

## 2023-10-18 DIAGNOSIS — C786 Secondary malignant neoplasm of retroperitoneum and peritoneum: Secondary | ICD-10-CM | POA: Diagnosis not present

## 2023-10-18 DIAGNOSIS — E876 Hypokalemia: Secondary | ICD-10-CM | POA: Insufficient documentation

## 2023-10-18 DIAGNOSIS — Z96642 Presence of left artificial hip joint: Secondary | ICD-10-CM | POA: Insufficient documentation

## 2023-10-18 DIAGNOSIS — C169 Malignant neoplasm of stomach, unspecified: Secondary | ICD-10-CM | POA: Diagnosis not present

## 2023-10-18 DIAGNOSIS — Z5189 Encounter for other specified aftercare: Secondary | ICD-10-CM | POA: Insufficient documentation

## 2023-10-18 DIAGNOSIS — R634 Abnormal weight loss: Secondary | ICD-10-CM | POA: Insufficient documentation

## 2023-10-18 DIAGNOSIS — Z5112 Encounter for antineoplastic immunotherapy: Secondary | ICD-10-CM | POA: Insufficient documentation

## 2023-10-18 DIAGNOSIS — R112 Nausea with vomiting, unspecified: Secondary | ICD-10-CM

## 2023-10-18 DIAGNOSIS — R630 Anorexia: Secondary | ICD-10-CM | POA: Diagnosis not present

## 2023-10-18 LAB — CMP (CANCER CENTER ONLY)
ALT: 9 U/L (ref 0–44)
AST: 16 U/L (ref 15–41)
Albumin: 3.5 g/dL (ref 3.5–5.0)
Alkaline Phosphatase: 117 U/L (ref 38–126)
Anion gap: 14 (ref 5–15)
BUN: 11 mg/dL (ref 6–20)
CO2: 23 mmol/L (ref 22–32)
Calcium: 9.6 mg/dL (ref 8.9–10.3)
Chloride: 98 mmol/L (ref 98–111)
Creatinine: 1.09 mg/dL (ref 0.61–1.24)
GFR, Estimated: >60 mL/min (ref >60)
Glucose, Bld: 107 mg/dL — ABNORMAL HIGH (ref 70–99)
Potassium: 3.8 mmol/L (ref 3.5–5.1)
Sodium: 135 mmol/L (ref 135–145)
Total Bilirubin: 0.5 mg/dL (ref 0.0–1.2)
Total Protein: 6.5 g/dL (ref 6.5–8.1)

## 2023-10-18 LAB — CBC WITH DIFFERENTIAL (CANCER CENTER ONLY)
Abs Immature Granulocytes: 0.5 10*3/uL — ABNORMAL HIGH (ref 0.00–0.07)
Basophils Absolute: 0.1 10*3/uL (ref 0.0–0.1)
Basophils Relative: 1 %
Eosinophils Absolute: 0.1 10*3/uL (ref 0.0–0.5)
Eosinophils Relative: 1 %
HCT: 40.8 % (ref 39.0–52.0)
Hemoglobin: 13.5 g/dL (ref 13.0–17.0)
Immature Granulocytes: 5 %
Lymphocytes Relative: 23 %
Lymphs Abs: 2.3 10*3/uL (ref 0.7–4.0)
MCH: 28.9 pg (ref 26.0–34.0)
MCHC: 33.1 g/dL (ref 30.0–36.0)
MCV: 87.4 fL (ref 80.0–100.0)
Monocytes Absolute: 1.3 10*3/uL — ABNORMAL HIGH (ref 0.1–1.0)
Monocytes Relative: 13 %
Neutro Abs: 5.9 10*3/uL (ref 1.7–7.7)
Neutrophils Relative %: 57 %
Platelet Count: 307 10*3/uL (ref 150–400)
RBC: 4.67 MIL/uL (ref 4.22–5.81)
RDW: 15.4 % (ref 11.5–15.5)
WBC Count: 10.1 10*3/uL (ref 4.0–10.5)
nRBC: 0 % (ref 0.0–0.2)

## 2023-10-18 MED ORDER — SODIUM CHLORIDE 0.9% FLUSH: 10.0000 mL | INTRAVENOUS | Status: DC | PRN

## 2023-10-18 MED ORDER — PROCHLORPERAZINE MALEATE 10 MG PO TABS
10.0000 mg | ORAL_TABLET | Freq: Four times a day (QID) | ORAL | Status: DC | PRN
  Administered 2023-10-18: 10 mg via ORAL
  Filled 2023-10-18: qty 1

## 2023-10-18 MED ORDER — OXALIPLATIN CHEMO INJECTION 100 MG/20ML
85.0000 mg/m2 | Freq: Once | INTRAVENOUS | Status: AC
  Administered 2023-10-18: 150 mg via INTRAVENOUS
  Filled 2023-10-18: qty 20

## 2023-10-18 MED ORDER — PALONOSETRON HCL INJECTION 0.25 MG/5ML
0.2500 mg | Freq: Once | INTRAVENOUS | Status: AC
  Administered 2023-10-18: 0.25 mg via INTRAVENOUS
  Filled 2023-10-18: qty 5

## 2023-10-18 MED ORDER — SODIUM CHLORIDE 0.9 % IV SOLN
2400.0000 mg/m2 | INTRAVENOUS | Status: DC
  Administered 2023-10-18: 4450 mg via INTRAVENOUS
  Filled 2023-10-18: qty 89

## 2023-10-18 MED ORDER — DRONABINOL 2.5 MG PO CAPS: 2.5000 mg | ORAL_CAPSULE | Freq: Two times a day (BID) | ORAL | 0 refills | Status: DC

## 2023-10-18 MED ORDER — DEXTROSE 5 % IV SOLN: INTRAVENOUS | Status: DC

## 2023-10-18 MED ORDER — LEUCOVORIN CALCIUM INJECTION 350 MG
400.0000 mg/m2 | Freq: Once | INTRAVENOUS | Status: AC
  Administered 2023-10-18: 740 mg via INTRAVENOUS
  Filled 2023-10-18: qty 37

## 2023-10-18 MED ORDER — FLUOROURACIL CHEMO INJECTION 2.5 GM/50ML
400.0000 mg/m2 | Freq: Once | INTRAVENOUS | Status: AC
  Administered 2023-10-18: 750 mg via INTRAVENOUS
  Filled 2023-10-18: qty 15

## 2023-10-18 MED ORDER — SODIUM CHLORIDE 0.9 % IV SOLN
240.0000 mg | Freq: Once | INTRAVENOUS | Status: AC
  Administered 2023-10-18: 240 mg via INTRAVENOUS
  Filled 2023-10-18: qty 20

## 2023-10-18 MED ORDER — DEXAMETHASONE SODIUM PHOSPHATE 10 MG/ML IJ SOLN
10.0000 mg | Freq: Once | INTRAMUSCULAR | Status: AC
  Administered 2023-10-18: 10 mg via INTRAVENOUS
  Filled 2023-10-18: qty 1

## 2023-10-18 NOTE — Patient Instructions (Signed)
 [facility]  Discharge Instructions: Thank you for choosing [facility] to provide your oncology and hematology care.   If you have a lab appointment with the Cancer Center, please go directly to the Cancer Center and check in at the registration area.   Wear comfortable clothing and clothing appropriate for easy access to any Portacath or PICC line.   We strive to give you quality time with your provider. You may need to reschedule your appointment if you arrive late (15 or more minutes).  Arriving late affects you and other patients whose appointments are after yours.  Also, if you miss three or more appointments without notifying the office, you may be dismissed from the clinic at the provider's discretion.      For prescription refill requests, have your pharmacy contact our office and allow 72 hours for refills to be completed.    Today you received the following chemotherapy and/or immunotherapy agents: Nivolumab, Oxaliplatin, Leucovorin, Fluorouracil   To help prevent nausea and vomiting after your treatment, we encourage you to take your nausea medication as directed.  BELOW ARE SYMPTOMS THAT SHOULD BE REPORTED IMMEDIATELY: *FEVER GREATER THAN 100.4 F (38 C) OR HIGHER *CHILLS OR SWEATING *NAUSEA AND VOMITING THAT IS NOT CONTROLLED WITH YOUR NAUSEA MEDICATION *UNUSUAL SHORTNESS OF BREATH *UNUSUAL BRUISING OR BLEEDING *URINARY PROBLEMS (pain or burning when urinating, or frequent urination) *BOWEL PROBLEMS (unusual diarrhea, constipation, pain near the anus) TENDERNESS IN MOUTH AND THROAT WITH OR WITHOUT PRESENCE OF ULCERS (sore throat, sores in mouth, or a toothache) UNUSUAL RASH, SWELLING OR PAIN  UNUSUAL VAGINAL DISCHARGE OR ITCHING   Items with * indicate a potential emergency and should be followed up as soon as possible or go to the Emergency Department if any problems should occur.  Please show the  CHEMOTHERAPY ALERT CARD or IMMUNOTHERAPY ALERT CARD at check-in to the Emergency Department and triage nurse.  Should you have questions after your visit or need to cancel or reschedule your appointment, please contact [facility]  Dept: [phone]  and follow the prompts.  Office hours are 8:00 a.m. to 4:30 p.m. Monday - Friday. Please note that voicemails left after 4:00 p.m. may not be returned until the following business day.  We are closed weekends and major holidays. You have access to a nurse at all times for urgent questions. Please call the main number to the clinic Dept: [phone] and follow the prompts.   For any non-urgent questions, you may also contact your provider using MyChart. We now offer e-Visits for anyone 70 and older to request care online for non-urgent symptoms. For details visit [email or web address].   Also download the MyChart app! Go to the app store, search "MyChart", open the app, select [place], and log in with your MyChart username and password.

## 2023-10-18 NOTE — Telephone Encounter (Signed)
 Mrs. [name] left message that [name] needs an authorization. Could not pick up today. Called back and told [name] that we will have the authorization nurse start the PA process for his medication. Email sent to nurse.

## 2023-10-18 NOTE — Progress Notes (Signed)
 Patient seen by Santiago Glad, NP today  Vitals are within treatment parameters:No (Please specify and give further instructions.) Pulse- 102 Okay to proceed, Decrease 8 pounds Labs are within treatment parameters: Yes   Treatment plan has been signed: Yes   Per physician team, Patient is ready for treatment and there are NO modifications to the treatment plan.

## 2023-10-18 NOTE — Progress Notes (Signed)
 Nutrition follow up completed with patient's wife during infusion for Gastric cancer. He is followed by Dr. Truett Perna and receives cycle 5 today with FOLFIRINOX/Nivo with Fulphila on day 3.  Weight documented as 137 pounds 9.6 oz. March 12 Weight 145 pounds 6.4 oz Feb 26.  Weight 165 pounds Jan 8. Weight 180 pounds March 2024.  Patient has lost 17% body weight in less than 3 months which is clinically significant. Lost 24% over the past year.  Paracentesis 07-27-23.  Paracentesis Feb 20/5 L of ascitic fluid. Referral made today for stat paracentesis.  Labs include Glucose 107.  Prescription given for [name] to help with appetite.  Patient vomited this morning. Abdomen noted to be firm and distended. He is laying in chair with blanket covering his head. Per wife, he still is not eating. She has hope he will eat more after he has the paracentesis. He has had poor oral intake for   Nutrition Diagnosis: Unintended wt loss related to gastric cancer and associated treatments as evidenced by 17% weight loss and report of very poor intake for weeks.  Intervention: Encouraged small amounts of food after paracentesis to assess tolerance. Choose bland, easy to digest foods/liquids. Medications per MD. If patient unable to increase oral intake, recommend enteral nutrition support based on plan of care. Provided support to wife.  Monitoring, Evaluation, Goals: Tolerate increased calories and protein to support weight stabilization.  Next Visit:Wednesday, March 26, during infusion.

## 2023-10-19 ENCOUNTER — Ambulatory Visit (HOSPITAL_COMMUNITY)

## 2023-10-19 ENCOUNTER — Telehealth: Payer: Self-pay

## 2023-10-19 NOTE — Telephone Encounter (Signed)
 Notified Patient of prior authorization approval for Dronabinol 2.5 mg Capsules. Medication is approved through 10/17/2024. Pharmacy notified. No other needs or concerns noted at this time.

## 2023-10-20 ENCOUNTER — Inpatient Hospital Stay: Payer: 59

## 2023-10-20 ENCOUNTER — Ambulatory Visit (HOSPITAL_COMMUNITY)
Admission: RE | Admit: 2023-10-20 | Discharge: 2023-10-20 | Disposition: A | Discharge status: Home / Self Care | Source: Ambulatory Visit | Attending: Nurse Practitioner | Admitting: Nurse Practitioner

## 2023-10-20 ENCOUNTER — Encounter: Payer: Self-pay | Admitting: *Deleted

## 2023-10-20 VITALS — BP 101/78 | HR 102 | Temp 97.6°F | Resp 16

## 2023-10-20 DIAGNOSIS — R188 Other ascites: Secondary | ICD-10-CM | POA: Insufficient documentation

## 2023-10-20 DIAGNOSIS — C163 Malignant neoplasm of pyloric antrum: Secondary | ICD-10-CM

## 2023-10-20 DIAGNOSIS — Z5111 Encounter for antineoplastic chemotherapy: Secondary | ICD-10-CM | POA: Diagnosis not present

## 2023-10-20 HISTORY — PX: IR PARACENTESIS: IMG2679

## 2023-10-20 MED ORDER — HEPARIN SOD (PORK) LOCK FLUSH 100 UNIT/ML IV SOLN
500.0000 [IU] | Freq: Once | INTRAVENOUS | Status: AC | PRN
Start: 2023-10-20 — End: 2023-10-20
  Administered 2023-10-20: 500 [IU]

## 2023-10-20 MED ORDER — LIDOCAINE HCL (PF) 1 % IJ SOLN
30.0000 mL | Freq: Once | INTRAMUSCULAR | Status: AC
  Administered 2023-10-20: 10 mL

## 2023-10-20 MED ORDER — LIDOCAINE HCL 1 % IJ SOLN
INTRAMUSCULAR | Status: AC
  Filled 2023-10-20: qty 20

## 2023-10-20 MED ORDER — SODIUM CHLORIDE 0.9% FLUSH
10.0000 mL | INTRAVENOUS | Status: DC | PRN
  Administered 2023-10-20: 10 mL

## 2023-10-20 MED ORDER — PEGFILGRASTIM-JMDB 6 MG/0.6ML ~~LOC~~ SOSY
6.0000 mg | PREFILLED_SYRINGE | Freq: Once | SUBCUTANEOUS | Status: AC
  Administered 2023-10-20: 6 mg via SUBCUTANEOUS
  Filled 2023-10-20: qty 0.6

## 2023-10-20 NOTE — Progress Notes (Signed)
 At patient request, received message from IR at [facility] requesting standing order for therapeutic paracentesis every 3 weeks.  Will discuss w/MD upon return to office on 3/20

## 2023-10-20 NOTE — Patient Instructions (Signed)

## 2023-10-20 NOTE — Procedures (Signed)
 PROCEDURE SUMMARY:  Successful image-guided paracentesis from the right lower abdomen.  Yielded 4.6 liters of clear yellow fluid.  No immediate complications.  EBL < 1 mL Patient tolerated well.   Specimen was sent for labs.  Please see imaging section of Epic for full dictation.  Villa Herb PA-C 10/20/2023 12:13 PM

## 2023-10-23 ENCOUNTER — Other Ambulatory Visit: Payer: Self-pay

## 2023-10-23 ENCOUNTER — Encounter (HOSPITAL_COMMUNITY): Payer: Self-pay | Admitting: Emergency Medicine

## 2023-10-23 ENCOUNTER — Telehealth: Payer: Self-pay | Admitting: *Deleted

## 2023-10-23 ENCOUNTER — Observation Stay (HOSPITAL_COMMUNITY)

## 2023-10-23 ENCOUNTER — Inpatient Hospital Stay (HOSPITAL_COMMUNITY)
Admission: EM | Admit: 2023-10-23 | Discharge: 2023-11-01 | DRG: 374 | Disposition: A | Discharge status: Home / Self Care | Source: Ambulatory Visit | Attending: Internal Medicine | Admitting: Internal Medicine

## 2023-10-23 DIAGNOSIS — Z7901 Long term (current) use of anticoagulants: Secondary | ICD-10-CM

## 2023-10-23 DIAGNOSIS — Z888 Allergy status to other drugs, medicaments and biological substances status: Secondary | ICD-10-CM

## 2023-10-23 DIAGNOSIS — Z681 Body mass index (BMI) 19 or less, adult: Secondary | ICD-10-CM

## 2023-10-23 DIAGNOSIS — Z800 Family history of malignant neoplasm of digestive organs: Secondary | ICD-10-CM

## 2023-10-23 DIAGNOSIS — R112 Nausea with vomiting, unspecified: Secondary | ICD-10-CM | POA: Diagnosis present

## 2023-10-23 DIAGNOSIS — E876 Hypokalemia: Secondary | ICD-10-CM | POA: Diagnosis not present

## 2023-10-23 DIAGNOSIS — K529 Noninfective gastroenteritis and colitis, unspecified: Secondary | ICD-10-CM | POA: Diagnosis present

## 2023-10-23 DIAGNOSIS — E860 Dehydration: Secondary | ICD-10-CM | POA: Diagnosis not present

## 2023-10-23 DIAGNOSIS — I70203 Unspecified atherosclerosis of native arteries of extremities, bilateral legs: Secondary | ICD-10-CM | POA: Diagnosis present

## 2023-10-23 DIAGNOSIS — D61810 Antineoplastic chemotherapy induced pancytopenia: Secondary | ICD-10-CM | POA: Diagnosis not present

## 2023-10-23 DIAGNOSIS — Q2112 Patent foramen ovale: Secondary | ICD-10-CM

## 2023-10-23 DIAGNOSIS — F1090 Alcohol use, unspecified, uncomplicated: Secondary | ICD-10-CM | POA: Diagnosis present

## 2023-10-23 DIAGNOSIS — Z833 Family history of diabetes mellitus: Secondary | ICD-10-CM

## 2023-10-23 DIAGNOSIS — E871 Hypo-osmolality and hyponatremia: Secondary | ICD-10-CM | POA: Diagnosis not present

## 2023-10-23 DIAGNOSIS — C163 Malignant neoplasm of pyloric antrum: Secondary | ICD-10-CM | POA: Diagnosis not present

## 2023-10-23 DIAGNOSIS — K2100 Gastro-esophageal reflux disease with esophagitis, without bleeding: Secondary | ICD-10-CM | POA: Diagnosis present

## 2023-10-23 DIAGNOSIS — R5081 Fever presenting with conditions classified elsewhere: Secondary | ICD-10-CM | POA: Diagnosis not present

## 2023-10-23 DIAGNOSIS — Z86718 Personal history of other venous thrombosis and embolism: Secondary | ICD-10-CM | POA: Diagnosis not present

## 2023-10-23 DIAGNOSIS — Z801 Family history of malignant neoplasm of trachea, bronchus and lung: Secondary | ICD-10-CM

## 2023-10-23 DIAGNOSIS — G893 Neoplasm related pain (acute) (chronic): Secondary | ICD-10-CM | POA: Diagnosis present

## 2023-10-23 DIAGNOSIS — K3189 Other diseases of stomach and duodenum: Secondary | ICD-10-CM | POA: Diagnosis present

## 2023-10-23 DIAGNOSIS — F17210 Nicotine dependence, cigarettes, uncomplicated: Secondary | ICD-10-CM | POA: Diagnosis present

## 2023-10-23 DIAGNOSIS — C169 Malignant neoplasm of stomach, unspecified: Secondary | ICD-10-CM | POA: Diagnosis present

## 2023-10-23 DIAGNOSIS — C786 Secondary malignant neoplasm of retroperitoneum and peritoneum: Secondary | ICD-10-CM | POA: Diagnosis present

## 2023-10-23 DIAGNOSIS — Z8249 Family history of ischemic heart disease and other diseases of the circulatory system: Secondary | ICD-10-CM

## 2023-10-23 DIAGNOSIS — Z79899 Other long term (current) drug therapy: Secondary | ICD-10-CM

## 2023-10-23 DIAGNOSIS — C800 Disseminated malignant neoplasm, unspecified: Secondary | ICD-10-CM

## 2023-10-23 DIAGNOSIS — Z9049 Acquired absence of other specified parts of digestive tract: Secondary | ICD-10-CM

## 2023-10-23 DIAGNOSIS — I10 Essential (primary) hypertension: Secondary | ICD-10-CM | POA: Diagnosis present

## 2023-10-23 DIAGNOSIS — B370 Candidal stomatitis: Secondary | ICD-10-CM | POA: Diagnosis present

## 2023-10-23 DIAGNOSIS — R1115 Cyclical vomiting syndrome unrelated to migraine: Secondary | ICD-10-CM | POA: Diagnosis present

## 2023-10-23 DIAGNOSIS — R64 Cachexia: Secondary | ICD-10-CM | POA: Diagnosis present

## 2023-10-23 DIAGNOSIS — C787 Secondary malignant neoplasm of liver and intrahepatic bile duct: Secondary | ICD-10-CM | POA: Diagnosis present

## 2023-10-23 DIAGNOSIS — E43 Unspecified severe protein-calorie malnutrition: Secondary | ICD-10-CM | POA: Diagnosis present

## 2023-10-23 DIAGNOSIS — D630 Anemia in neoplastic disease: Secondary | ICD-10-CM | POA: Diagnosis present

## 2023-10-23 DIAGNOSIS — R180 Malignant ascites: Secondary | ICD-10-CM | POA: Diagnosis present

## 2023-10-23 DIAGNOSIS — Z8042 Family history of malignant neoplasm of prostate: Secondary | ICD-10-CM

## 2023-10-23 DIAGNOSIS — Z803 Family history of malignant neoplasm of breast: Secondary | ICD-10-CM

## 2023-10-23 DIAGNOSIS — Z96642 Presence of left artificial hip joint: Secondary | ICD-10-CM | POA: Diagnosis present

## 2023-10-23 DIAGNOSIS — K449 Diaphragmatic hernia without obstruction or gangrene: Secondary | ICD-10-CM | POA: Diagnosis present

## 2023-10-23 DIAGNOSIS — Z1152 Encounter for screening for COVID-19: Secondary | ICD-10-CM

## 2023-10-23 DIAGNOSIS — K2970 Gastritis, unspecified, without bleeding: Secondary | ICD-10-CM | POA: Diagnosis present

## 2023-10-23 DIAGNOSIS — C799 Secondary malignant neoplasm of unspecified site: Secondary | ICD-10-CM | POA: Diagnosis present

## 2023-10-23 DIAGNOSIS — R109 Unspecified abdominal pain: Secondary | ICD-10-CM

## 2023-10-23 DIAGNOSIS — K567 Ileus, unspecified: Secondary | ICD-10-CM | POA: Diagnosis present

## 2023-10-23 DIAGNOSIS — T451X5A Adverse effect of antineoplastic and immunosuppressive drugs, initial encounter: Secondary | ICD-10-CM | POA: Diagnosis not present

## 2023-10-23 DIAGNOSIS — D689 Coagulation defect, unspecified: Secondary | ICD-10-CM | POA: Diagnosis present

## 2023-10-23 LAB — CBC WITH DIFFERENTIAL/PLATELET
Abs Immature Granulocytes: 0 10*3/uL (ref 0.00–0.07)
Basophils Absolute: 0 10*3/uL (ref 0.0–0.1)
Basophils Relative: 0 %
Eosinophils Absolute: 0 10*3/uL (ref 0.0–0.5)
Eosinophils Relative: 0 %
HCT: 38.9 % — ABNORMAL LOW (ref 39.0–52.0)
Hemoglobin: 13.3 g/dL (ref 13.0–17.0)
Lymphocytes Relative: 3 %
Lymphs Abs: 1.1 10*3/uL (ref 0.7–4.0)
MCH: 30 pg (ref 26.0–34.0)
MCHC: 34.2 g/dL (ref 30.0–36.0)
MCV: 87.8 fL (ref 80.0–100.0)
Monocytes Absolute: 0.4 10*3/uL (ref 0.1–1.0)
Monocytes Relative: 1 %
Neutro Abs: 34.5 10*3/uL — ABNORMAL HIGH (ref 1.7–7.7)
Neutrophils Relative %: 96 %
Platelets: 253 10*3/uL (ref 150–400)
RBC: 4.43 MIL/uL (ref 4.22–5.81)
RDW: 15.4 % (ref 11.5–15.5)
WBC: 35.9 10*3/uL — ABNORMAL HIGH (ref 4.0–10.5)
nRBC: 0 % (ref 0.0–0.2)

## 2023-10-23 LAB — HEPARIN LEVEL (UNFRACTIONATED): Heparin Unfractionated: >1.1 [IU]/mL — ABNORMAL HIGH (ref 0.30–0.70)

## 2023-10-23 LAB — COMPREHENSIVE METABOLIC PANEL
ALT: 10 U/L (ref 0–44)
AST: 24 U/L (ref 15–41)
Albumin: 2.6 g/dL — ABNORMAL LOW (ref 3.5–5.0)
Alkaline Phosphatase: 169 U/L — ABNORMAL HIGH (ref 38–126)
Anion gap: 12 (ref 5–15)
BUN: 14 mg/dL (ref 6–20)
CO2: 29 mmol/L (ref 22–32)
Calcium: 8.9 mg/dL (ref 8.9–10.3)
Chloride: 92 mmol/L — ABNORMAL LOW (ref 98–111)
Creatinine, Ser: 0.77 mg/dL (ref 0.61–1.24)
GFR, Estimated: >60 mL/min (ref >60)
Glucose, Bld: 118 mg/dL — ABNORMAL HIGH (ref 70–99)
Potassium: 3 mmol/L — ABNORMAL LOW (ref 3.5–5.1)
Sodium: 133 mmol/L — ABNORMAL LOW (ref 135–145)
Total Bilirubin: 0.8 mg/dL (ref 0.0–1.2)
Total Protein: 5.8 g/dL — ABNORMAL LOW (ref 6.5–8.1)

## 2023-10-23 LAB — RESP PANEL BY RT-PCR (RSV, FLU A&B, COVID)  RVPGX2
Influenza A by PCR: NEGATIVE
Influenza B by PCR: NEGATIVE
Resp Syncytial Virus by PCR: NEGATIVE
SARS Coronavirus 2 by RT PCR: NEGATIVE

## 2023-10-23 LAB — PHOSPHORUS: Phosphorus: 3.3 mg/dL (ref 2.5–4.6)

## 2023-10-23 LAB — CYTOLOGY - NON PAP

## 2023-10-23 LAB — APTT: aPTT: 32 s (ref 24–36)

## 2023-10-23 LAB — MAGNESIUM: Magnesium: 2.1 mg/dL (ref 1.7–2.4)

## 2023-10-23 MED ORDER — PANTOPRAZOLE SODIUM 40 MG IV SOLR
40.0000 mg | Freq: Every day | INTRAVENOUS | Status: DC
  Administered 2023-10-23 – 2023-10-31 (×9): 40 mg via INTRAVENOUS
  Filled 2023-10-23 (×9): qty 10

## 2023-10-23 MED ORDER — PROCHLORPERAZINE EDISYLATE 10 MG/2ML IJ SOLN
5.0000 mg | Freq: Four times a day (QID) | INTRAMUSCULAR | Status: DC | PRN
  Administered 2023-10-25 – 2023-10-28 (×3): 5 mg via INTRAVENOUS
  Filled 2023-10-23 (×3): qty 2

## 2023-10-23 MED ORDER — HEPARIN (PORCINE) 25000 UT/250ML-% IV SOLN
1000.0000 [IU]/h | INTRAVENOUS | Status: DC
  Administered 2023-10-23 – 2023-10-25 (×2): 1000 [IU]/h via INTRAVENOUS
  Filled 2023-10-23 (×2): qty 250

## 2023-10-23 MED ORDER — LACTATED RINGERS IV SOLN: INTRAVENOUS | Status: AC

## 2023-10-23 MED ORDER — SODIUM CHLORIDE 0.9 % IV BOLUS
1000.0000 mL | Freq: Once | INTRAVENOUS | Status: AC
  Administered 2023-10-23: 1000 mL via INTRAVENOUS

## 2023-10-23 MED ORDER — ONDANSETRON HCL 4 MG/2ML IJ SOLN
4.0000 mg | Freq: Once | INTRAMUSCULAR | Status: AC
  Administered 2023-10-23: 4 mg via INTRAVENOUS
  Filled 2023-10-23: qty 2

## 2023-10-23 MED ORDER — ACETAMINOPHEN 325 MG PO TABS
650.0000 mg | ORAL_TABLET | Freq: Four times a day (QID) | ORAL | Status: DC | PRN
  Administered 2023-10-29 (×2): 650 mg via ORAL
  Filled 2023-10-23 (×2): qty 2

## 2023-10-23 MED ORDER — ACETAMINOPHEN 650 MG RE SUPP: 650.0000 mg | Freq: Four times a day (QID) | RECTAL | Status: DC | PRN

## 2023-10-23 MED ORDER — CHLORHEXIDINE GLUCONATE CLOTH 2 % EX PADS
6.0000 | MEDICATED_PAD | Freq: Every day | CUTANEOUS | Status: DC
  Administered 2023-10-23 – 2023-10-31 (×9): 6 via TOPICAL

## 2023-10-23 MED ORDER — SODIUM CHLORIDE 0.9% FLUSH
3.0000 mL | Freq: Two times a day (BID) | INTRAVENOUS | Status: DC
  Administered 2023-10-23 – 2023-11-01 (×16): 3 mL via INTRAVENOUS

## 2023-10-23 MED ORDER — POTASSIUM CHLORIDE 10 MEQ/100ML IV SOLN
10.0000 meq | INTRAVENOUS | Status: AC
  Administered 2023-10-23 (×4): 10 meq via INTRAVENOUS
  Filled 2023-10-23 (×4): qty 100

## 2023-10-23 MED ORDER — ONDANSETRON HCL 4 MG PO TABS: 4.0000 mg | ORAL_TABLET | Freq: Four times a day (QID) | ORAL | Status: DC | PRN

## 2023-10-23 MED ORDER — HYDROMORPHONE HCL 1 MG/ML IJ SOLN
0.5000 mg | INTRAMUSCULAR | Status: DC | PRN
  Administered 2023-10-23 – 2023-11-01 (×38): 1 mg via INTRAVENOUS
  Filled 2023-10-23 (×41): qty 1

## 2023-10-23 MED ORDER — SODIUM CHLORIDE 0.9 % IV SOLN: 12.5000 mg | Freq: Four times a day (QID) | INTRAVENOUS | Status: DC | PRN

## 2023-10-23 MED ORDER — SODIUM CHLORIDE 0.9% FLUSH: 10.0000 mL | INTRAVENOUS | Status: DC | PRN

## 2023-10-23 MED ORDER — BOOST / RESOURCE BREEZE PO LIQD CUSTOM
1.0000 | Freq: Three times a day (TID) | ORAL | Status: DC
  Administered 2023-10-23 – 2023-10-31 (×9): 1 via ORAL

## 2023-10-23 MED ORDER — POTASSIUM CHLORIDE IN NACL 20-0.9 MEQ/L-% IV SOLN: Freq: Once | INTRAVENOUS | Status: DC

## 2023-10-23 MED ORDER — ONDANSETRON HCL 4 MG/2ML IJ SOLN
4.0000 mg | Freq: Four times a day (QID) | INTRAMUSCULAR | Status: DC | PRN
  Administered 2023-10-25 – 2023-10-27 (×4): 4 mg via INTRAVENOUS
  Filled 2023-10-23 (×4): qty 2

## 2023-10-23 NOTE — ED Triage Notes (Signed)
 Patient was given a new medication due to weight loss. He has been vomiting all weekend. He was told to come here. Last chemo treatment was this past Thursday.

## 2023-10-23 NOTE — Progress Notes (Signed)
 PHARMACY - ANTICOAGULATION CONSULT NOTE  Pharmacy Consult for Heparin Indication:  h/o VTE  Allergies  Allergen Reactions   Pravastatin Itching   Simvastatin Itching    Patient Measurements:   Heparin Dosing Weight: 62.4 kg  Vital Signs: Temp: 98.1 F (36.7 C) (03/17 1503) Temp Source: Oral (03/17 1503) BP: 116/92 (03/17 1600) Pulse Rate: 100 (03/17 1600)  Labs: Recent Labs    10/23/23 1603  HGB 13.3  HCT 38.9*  PLT 253  CREATININE 0.77    Estimated Creatinine Clearance: 86.7 mL/min (by C-G formula based on SCr of 0.77 mg/dL).   Medical History: Past Medical History:  Diagnosis Date   Chest pain    stres test neg x 2, cath 5/12; minimal LAD irregs; no obs CAD; normal LVF   Clotting disorder (HCC)    dvt   Deep vein thrombosis (HCC) 10/06/2010   GERD (gastroesophageal reflux disease)    History of DVT of lower extremity    on chronic coumadin   Hypertension    Internal hemorrhoids    Cscope 2007   PAD (peripheral artery disease) (HCC)    a. left leg ischemia tx wtih embolectomy of left fem, pop, tib arteries with Dr. Edilia Bo - 08/2006;  b. known occlusion of right pop with collats - med Rx;  c.ABI's 8/09: R 1.0; L 0.91    PFO (patent foramen ovale)    per 08/2006 discharge summary, TEE showed EF 60%, small PFO with minimal right-to-left shunt with Valsalva; not felt to be the source of emboli, lifelong Coumadin recommended   Pneumonia    history of   Polysubstance abuse (HCC)    marijuana only   Renal infarct (HCC)    R    Assessment: Active Problem(s): Emesis Has been taking Protonix, Carafate and was recently started on [name] for appetite stimulation but continues to vomit.   AC/Heme: Eliquis PTA (LD 3/17 1000) for h/o VTE, Hgb 13.3, Plts 253 Start IV heparin  Goal of Therapy:  aPTT 66-102 seconds Monitor platelets by anticoagulation protocol: Yes   Plan:  At 2200 when next dose of Eliquis would be due, start IV heparin, no bolus, at 1000  units/hr Daily aPTT, CBC, HL.   Zohaib Heeney S. Merilynn Finland, PharmD, BCPS Clinical Staff Pharmacist Misty Stanley Stillinger 10/23/2023,5:53 PM

## 2023-10-23 NOTE — Telephone Encounter (Signed)
 After discussion with NP x 2 that are familiar with his case, called [name] was informed that the best option is to go to [facility] emergency room, since he is taking everything possible for N/V as well as carafate and protonix. Nothing can turn him around here in the office and if he needs a feeding tube this has to be done in hospital setting. She was not pleased with this suggestion.

## 2023-10-23 NOTE — ED Provider Notes (Signed)
 [name] Provider Note   CSN: [id number] Arrival date & time: 10/23/23  1448     History  Chief Complaint  Patient presents with   Emesis    [name] is a 63 y.o. male.  With a history of gastric cancer on chemotherapy and ascites who presents to the ED for vomiting.  Patient is currently undergoing chemotherapy for gastric cancer.  His last dose of FOLFOX/Nivo was 5 days ago.  This was cycle 4 of plan for 12.  Has struggled with weight loss decreased appetite nausea and vomiting throughout her chemotherapy course.  Has been taking Protonix, Carafate and was recently started on [name] for appetite stimulation but continues to vomit.  Has been unable to keep food or meds down at home.  He is followed by Dr. Truett Perna with hematology oncology and was directed here by the oncology office with plan for admission and likely GI consultation during admission.  Last paracentesis with IR was last week   Emesis      Home Medications Prior to Admission medications   Medication Sig Start Date End Date Taking? Authorizing Provider  aluminum-magnesium hydroxide-simethicone (MAALOX) 200-200-20 MG/5ML SUSP Take 15 mLs by mouth as needed (As needed). Patient not taking: Reported on 10/18/2023    [provider]  apixaban (ELIQUIS) 5 MG TABS tablet Take 1 tablet by mouth twice daily Patient taking differently: Take 5 mg by mouth See admin instructions. Take 5 mg by mouth in the morning and afternoon 04/21/23   [name], MD  dronabinol ([name]) 2.5 MG capsule Take 1 capsule (2.5 mg total) by mouth 2 (two) times daily before lunch and supper. 10/18/23   Pollyann Samples, NP  EPINEPHrine (EPIPEN 2-PAK) 0.3 mg/0.3 mL IJ SOAJ injection Inject 0.3 mg into the muscle as needed for anaphylaxis. Patient not taking: Reported on 10/04/2023 12/19/22   Wanda Plump, MD  HYDROmorphone (DILAUDID) 4 MG tablet Take 1-2 tablets (4-8 mg total) by mouth  every 4 (four) hours as needed for severe pain (pain score 7-10). 10/12/23   Rana Snare, NP  lidocaine-prilocaine (EMLA) cream Apply 1 Application topically as needed (Apply to Port 1-2 hours prior to its use). 08/11/23   Ladene Artist, MD  Nicotine (NICODERM CQ TD) Place 1 patch onto the skin daily as needed (for smoking cessation).    [provider]  ondansetron (ZOFRAN) 8 MG tablet TAKE 1 TABLET BY MOUTH EVERY 8 HOURS AS NEEDED (START  3  DAYS  AFTER  CHEMO  AS  NEEDED  FOR  NAUSEA/VOMITING). Patient taking differently: Take 8 mg by mouth every 8 (eight) hours as needed for nausea or vomiting (start 3 days after chemotherapy). 09/18/23   Ladene Artist, MD  pantoprazole (PROTONIX) 40 MG tablet Take 1 tablet (40 mg total) by mouth 2 (two) times daily. 08/22/23   Rana Snare, NP  polyethylene glycol (MIRALAX / GLYCOLAX) 17 g packet Take 17 g by mouth daily as needed for mild constipation. Patient not taking: Reported on 10/04/2023 07/20/23   Dorcas Carrow, MD  potassium chloride (KLOR-CON M) 10 MEQ tablet Take 1 tablet (10 mEq total) by mouth 2 (two) times daily. 08/16/23   Rana Snare, NP  prochlorperazine (COMPAZINE) 10 MG tablet TAKE 1 TABLET BY MOUTH EVERY 6 HOURS AS NEEDED FOR NAUSEA FOR VOMITING Patient taking differently: Take 10 mg by mouth every 6 (six) hours as needed for nausea or vomiting. 09/18/23  Ladene Artist, MD  sennosides-docusate sodium (SENOKOT-S) 8.6-50 MG tablet Take 2 tablets by mouth 2 (two) times daily as needed for constipation (hold for diarrhea). Patient not taking: Reported on 10/04/2023 09/06/23   [provider]  sorbitol 70 % SOLN Take 15 ml by mouth every 6 hours until BM occurs as directed by physician.  Then take 15 ml daily as needed for constipation. 09/06/23   Ladene Artist, MD  sucralfate (CARAFATE) 1 g tablet Take 1 tablet (1 g total) by mouth 4 (four) times daily -  with meals and at bedtime. Dissolve tablet in water to make liquid  08/11/23   Ladene Artist, MD      Allergies    Pravastatin and Simvastatin    Review of Systems   Review of Systems  Gastrointestinal:  Positive for vomiting.    Physical Exam Updated Vital Signs BP (!) 116/92   Pulse 100   Temp 98.1 F (36.7 C) (Oral)   Resp 16   SpO2 100%  Physical Exam Vitals and nursing note reviewed.  Constitutional:      Comments: Chronically ill-appearing, cachectic  HENT:     Head: Normocephalic and atraumatic.  Eyes:     Pupils: Pupils are equal, round, and reactive to light.  Cardiovascular:     Rate and Rhythm: Normal rate and regular rhythm.  Pulmonary:     Effort: Pulmonary effort is normal.     Breath sounds: Normal breath sounds.  Abdominal:     General: There is no distension.     Palpations: Abdomen is soft.     Tenderness: There is no abdominal tenderness. There is no guarding or rebound.     Comments: No ascites  Skin:    General: Skin is warm and dry.     Comments: Port over right anterior chest without erythema induration or tenderness  Neurological:     Mental Status: He is alert.  Psychiatric:        Mood and Affect: Mood normal.     ED Results / Procedures / Treatments   Labs (all labs ordered are listed, but only abnormal results are displayed) Labs Reviewed  COMPREHENSIVE METABOLIC PANEL - Abnormal; Notable for the following components:      Result Value   Sodium 133 (*)    Potassium 3.0 (*)    Chloride 92 (*)    Glucose, Bld 118 (*)    Total Protein 5.8 (*)    Albumin 2.6 (*)    Alkaline Phosphatase 169 (*)    All other components within normal limits  CBC WITH DIFFERENTIAL/PLATELET - Abnormal; Notable for the following components:   WBC 35.9 (*)    HCT 38.9 (*)    All other components within normal limits  RESP PANEL BY RT-PCR (RSV, FLU A&B, COVID)  [id number]  MAGNESIUM  PHOSPHORUS    EKG None  Radiology No results found.  Procedures Procedures    Medications Ordered in ED Medications  0.9 %  NaCl with KCl 20 mEq/ L  infusion (has no administration in time range)  sodium chloride 0.9 % bolus 1,000 mL (1,000 mLs Intravenous New Bag/Given 10/23/23 1600)  ondansetron (ZOFRAN) injection 4 mg (4 mg Intravenous Given 10/23/23 1600)    ED Course/ Medical Decision Making/ A&P Clinical Course as of 10/23/23 1714  Mon Oct 23, 2023  1712 Laboratory workup notable for leukocytosis although this is most likely the result of recent immunotherapy.  Hypokalemia 3.0.  Magnesium 2.1.  Will provide IV potassium repletion.  Discussed admitting hospitalist who accepts patient for admission. [MP]    Clinical Course User Index [MP] Nayquan, Evinger, DO                                 Medical Decision Making 63 year old male with history as above returns for recurrent nausea vomiting weight loss anorexia in the setting of chemotherapy.  He was directed here by his oncology team.  Persistent nausea vomiting with no appetite.  Unable to keep food and drink and medications down.  Afebrile with no respiratory symptoms.  Low suspicion for acute infection.  Suspect this is most likely in the setting of chemotherapy.  Discussed with his oncology team who recommends admission with plan for GI consult.  Will obtain laboratory workup give IV fluids for rehydration and Zofran for nausea vomiting.  Agree with plan for admission.  Amount and/or Complexity of Data Reviewed Labs: ordered.  Risk Prescription drug management. Decision regarding hospitalization.           Final Clinical Impression(s) / ED Diagnoses Final diagnoses:  Dehydration  Nausea and vomiting, unspecified vomiting type  Hypokalemia    Rx / DC Orders ED Discharge Orders     None         [place], [place], DO 10/23/23 1714

## 2023-10-23 NOTE — Telephone Encounter (Signed)
 Spoke with patient and his wife regarding continued vomiting 2-3/day with almost all po intake. Started the [name] 2.5 mg bid on Saturday with no improvement in N/V. Often awakes in middle of night to vomit. Is taking all oral meds as directed for nausea and for his stomach. Reports vomit looks like bile now. Last BM 3 days ago. Abdomen is doing fine-last paracentesis 3/12. Weight down to 133 lb at home today.

## 2023-10-23 NOTE — H&P (Signed)
 History and Physical    [name] OZH:[id number] DOB: October 23, 1960 DOA: 10/23/2023  PCP: Wanda Plump, MD   Patient coming from: Home   Chief Complaint: N/V   HPI: [name] is a 63 y.o. male with medical history significant for hypertension, PAD, DVT on Eliquis, and gastric cancer undergoing treatment with FOLFOX/nivolumab who presents with severe nausea and vomiting.  Patient reports nausea with recurrent bouts of vomiting since 10/20/2023.  He denies any change in his chronic intermittent abdominal pain.  He denies diarrhea.  He denies fevers.  He has been using antiemetics at home without any improvement.  ED Course: Upon arrival to the ED, patient is found to be afebrile and saturating well on room air with mild tachycardia and stable blood pressure.  Labs are most notable for sodium 133, potassium 3.0, albumin 2.6, WBC 35,900, and negative respiratory virus panel.  ED physician discussed the case with oncology who recommended medical admission and GI consultation.  GI was consulted by the ED physician and the patient was treated with 1 L NS and Zofran in the ED.  Review of Systems:  All other systems reviewed and apart from HPI, are negative.  Past Medical History:  Diagnosis Date   Chest pain    stres test neg x 2, cath 5/12; minimal LAD irregs; no obs CAD; normal LVF   Clotting disorder (HCC)    dvt   Deep vein thrombosis (HCC) 10/06/2010   GERD (gastroesophageal reflux disease)    History of DVT of lower extremity    on chronic coumadin   Hypertension    Internal hemorrhoids    Cscope 2007   PAD (peripheral artery disease) (HCC)    a. left leg ischemia tx wtih embolectomy of left fem, pop, tib arteries with Dr. Edilia Bo - 08/2006;  b. known occlusion of right pop with collats - med Rx;  c.ABI's 8/09: R 1.0; L 0.91    PFO (patent foramen ovale)    per 08/2006 discharge summary, TEE showed EF 60%, small PFO with minimal right-to-left shunt with Valsalva; not  felt to be the source of emboli, lifelong Coumadin recommended   Pneumonia    history of   Polysubstance abuse (HCC)    marijuana only   Renal infarct [facility])    R    Past Surgical History:  Procedure Laterality Date   BIOPSY  07/18/2023   Procedure: BIOPSY;  Surgeon: Sherrilyn Rist, MD;  Location: [facility] ENDOSCOPY;  Service: Gastroenterology;;   CHOLECYSTECTOMY     COLONOSCOPY     ESOPHAGOGASTRODUODENOSCOPY (EGD) WITH PROPOFOL N/A 07/18/2023   Procedure: ESOPHAGOGASTRODUODENOSCOPY (EGD) WITH PROPOFOL;  Surgeon: Sherrilyn Rist, MD;  Location: [facility] ENDOSCOPY;  Service: Gastroenterology;  Laterality: N/A;   INGUINAL HERNIA REPAIR Left 07/16/2019   Procedure: LAPAROSCOPIC LEFT INGUINAL HERNIA REPAIR WITH MESH;  Surgeon: Axel Filler, MD;  Location: [facility] OR;  Service: General;  Laterality: Left;   IR IMAGING GUIDED PORT INSERTION  08/08/2023   IR PARACENTESIS  07/17/2023   IR PARACENTESIS  10/20/2023   left leg blood clot removal 2009  2009   TONSILLECTOMY     TOTAL HIP ARTHROPLASTY Left 02/03/2023   Procedure: LEFT TOTAL HIP ARTHROPLASTY ANTERIOR APPROACH;  Surgeon: Tarry Kos, MD;  Location: [facility] OR;  Service: Orthopedics;  Laterality: Left;  3-C   WRIST SURGERY Left     Social History:   reports that he has been smoking cigarettes. He has a 30 pack-year smoking history.  He has never used smokeless tobacco. He reports current alcohol use of about 21.0 standard drinks of alcohol per week. He reports current drug use. Drug: Marijuana.  Allergies  Allergen Reactions   Pravastatin Itching   Simvastatin Itching    Family History  Problem Relation Age of Onset   Hypertension Mother    Breast cancer Mother    Hypertension Father    CAD Father    Diabetes Father    Lung cancer Maternal Uncle    Pancreatic cancer Brother    Prostate cancer Maternal Uncle    Heart disease Paternal Grandmother    Colon cancer Neg Hx    Stomach cancer Neg Hx    Esophageal cancer Neg Hx    Rectal  cancer Neg Hx      Prior to Admission medications   Medication Sig Start Date End Date Taking? Authorizing Provider  aluminum-magnesium hydroxide-simethicone (MAALOX) 200-200-20 MG/5ML SUSP Take 15 mLs by mouth as needed (As needed). Patient not taking: Reported on 10/18/2023    [provider]  apixaban (ELIQUIS) 5 MG TABS tablet Take 1 tablet by mouth twice daily Patient taking differently: Take 5 mg by mouth See admin instructions. Take 5 mg by mouth in the morning and afternoon 04/21/23   [name], MD  dronabinol ([name]) 2.5 MG capsule Take 1 capsule (2.5 mg total) by mouth 2 (two) times daily before lunch and supper. 10/18/23   Pollyann Samples, NP  EPINEPHrine (EPIPEN 2-PAK) 0.3 mg/0.3 mL IJ SOAJ injection Inject 0.3 mg into the muscle as needed for anaphylaxis. Patient not taking: Reported on 10/04/2023 12/19/22   Wanda Plump, MD  HYDROmorphone (DILAUDID) 4 MG tablet Take 1-2 tablets (4-8 mg total) by mouth every 4 (four) hours as needed for severe pain (pain score 7-10). 10/12/23   Rana Snare, NP  lidocaine-prilocaine (EMLA) cream Apply 1 Application topically as needed (Apply to Port 1-2 hours prior to its use). 08/11/23   Ladene Artist, MD  Nicotine (NICODERM CQ TD) Place 1 patch onto the skin daily as needed (for smoking cessation).    [provider]  ondansetron (ZOFRAN) 8 MG tablet TAKE 1 TABLET BY MOUTH EVERY 8 HOURS AS NEEDED (START  3  DAYS  AFTER  CHEMO  AS  NEEDED  FOR  NAUSEA/VOMITING). Patient taking differently: Take 8 mg by mouth every 8 (eight) hours as needed for nausea or vomiting (start 3 days after chemotherapy). 09/18/23   Ladene Artist, MD  pantoprazole (PROTONIX) 40 MG tablet Take 1 tablet (40 mg total) by mouth 2 (two) times daily. 08/22/23   Rana Snare, NP  polyethylene glycol (MIRALAX / GLYCOLAX) 17 g packet Take 17 g by mouth daily as needed for mild constipation. Patient not taking: Reported on 10/04/2023 07/20/23   Dorcas Carrow,  MD  potassium chloride (KLOR-CON M) 10 MEQ tablet Take 1 tablet (10 mEq total) by mouth 2 (two) times daily. 08/16/23   Rana Snare, NP  prochlorperazine (COMPAZINE) 10 MG tablet TAKE 1 TABLET BY MOUTH EVERY 6 HOURS AS NEEDED FOR NAUSEA FOR VOMITING Patient taking differently: Take 10 mg by mouth every 6 (six) hours as needed for nausea or vomiting. 09/18/23   Ladene Artist, MD  sennosides-docusate sodium (SENOKOT-S) 8.6-50 MG tablet Take 2 tablets by mouth 2 (two) times daily as needed for constipation (hold for diarrhea). Patient not taking: Reported on 10/04/2023 09/06/23   [provider]  sorbitol 70 % SOLN Take 15  ml by mouth every 6 hours until BM occurs as directed by physician.  Then take 15 ml daily as needed for constipation. 09/06/23   Ladene Artist, MD  sucralfate (CARAFATE) 1 g tablet Take 1 tablet (1 g total) by mouth 4 (four) times daily -  with meals and at bedtime. Dissolve tablet in water to make liquid 08/11/23   Ladene Artist, MD    Physical Exam: Vitals:   10/23/23 1503 10/23/23 1600  BP: (!) 125/97 (!) 116/92  Pulse: (!) 102 100  Resp: 12 16  Temp: 98.1 F (36.7 C)   TempSrc: Oral   SpO2: 99% 100%    Constitutional: NAD, no pallor or diaphoresis   Eyes: PERTLA, lids and conjunctivae normal ENMT: Mucous membranes are dry. Posterior pharynx clear of any exudate or lesions.   Neck: supple, no masses  Respiratory: no wheezing, no crackles. No accessory muscle use.  Cardiovascular: S1 & S2 heard, regular rate and rhythm. No extremity edema.   Abdomen: Soft, no guarding. Bowel sounds active.  Musculoskeletal: no clubbing / cyanosis. No joint deformity upper and lower extremities.   Skin: no significant rashes, lesions, ulcers. Warm, dry, well-perfused. Neurologic: CN 2-12 grossly intact. Moving all extremities. Alert and oriented.  Psychiatric: Pleasant. Cooperative.    Labs and Imaging on Admission: I have personally reviewed following labs and  imaging studies  CBC: Recent Labs  Lab 10/18/23 0752 10/23/23 1603  WBC 10.1 35.9*  NEUTROABS 5.9 PENDING  HGB 13.5 13.3  HCT 40.8 38.9*  MCV 87.4 87.8  PLT 307 253   Basic Metabolic Panel: Recent Labs  Lab 10/18/23 0752 10/23/23 1603  NA 135 133*  K 3.8 3.0*  CL 98 92*  CO2 23 29  GLUCOSE 107* 118*  BUN 11 14  CREATININE 1.09 0.77  CALCIUM 9.6 8.9  MG  --  2.1  PHOS  --  3.3   GFR: Estimated Creatinine Clearance: 86.7 mL/min (by C-G formula based on SCr of 0.77 mg/dL). Liver Function Tests: Recent Labs  Lab 10/18/23 0752 10/23/23 1603  AST 16 24  ALT 9 10  ALKPHOS 117 169*  BILITOT 0.5 0.8  PROT 6.5 5.8*  ALBUMIN 3.5 2.6*   No results for input(s): "LIPASE", "AMYLASE" in the last 168 hours. No results for input(s): "AMMONIA" in the last 168 hours. Coagulation Profile: No results for input(s): "INR", "PROTIME" in the last 168 hours. Cardiac Enzymes: No results for input(s): "CKTOTAL", "CKMB", "CKMBINDEX", "TROPONINI" in the last 168 hours. BNP (last 3 results) No results for input(s): "PROBNP" in the last 8760 hours. HbA1C: No results for input(s): "HGBA1C" in the last 72 hours. CBG: No results for input(s): "GLUCAP" in the last 168 hours. Lipid Profile: No results for input(s): "CHOL", "HDL", "LDLCALC", "TRIG", "CHOLHDL", "LDLDIRECT" in the last 72 hours. Thyroid Function Tests: No results for input(s): "TSH", "T4TOTAL", "FREET4", "T3FREE", "THYROIDAB" in the last 72 hours. Anemia Panel: No results for input(s): "VITAMINB12", "FOLATE", "FERRITIN", "TIBC", "IRON", "RETICCTPCT" in the last 72 hours. Urine analysis:    Component Value Date/Time   COLORURINE YELLOW 07/11/2023 1213   APPEARANCEUR CLEAR 07/11/2023 1213   LABSPEC <=1.005 (A) 07/11/2023 1213   PHURINE 6.0 07/11/2023 1213   GLUCOSEU NEGATIVE 07/11/2023 1213   HGBUR NEGATIVE 07/11/2023 1213   BILIRUBINUR NEGATIVE 07/11/2023 1213   BILIRUBINUR Negative 02/05/2021 1125   KETONESUR  NEGATIVE 07/11/2023 1213   PROTEINUR Positive (A) 02/05/2021 1125   PROTEINUR 100 (A) 02/22/2017 1243   UROBILINOGEN 0.2 07/11/2023  1213   NITRITE NEGATIVE 07/11/2023 1213   LEUKOCYTESUR NEGATIVE 07/11/2023 1213   Sepsis Labs: @LABRCNTIP (procalcitonin:4,lacticidven:4) ) Recent Results (from the past 240 hours)  Resp panel by RT-PCR (RSV, Flu A&B, Covid) Anterior Nasal Swab     Status: None   Collection Time: 10/23/23  3:50 PM   Specimen: Anterior Nasal Swab  Result Value Ref Range Status   SARS Coronavirus 2 by RT PCR NEGATIVE NEGATIVE Final    Comment: (NOTE) SARS-CoV-2 target nucleic acids are NOT DETECTED.  The SARS-CoV-2 RNA is generally detectable in upper respiratory specimens during the acute phase of infection. The lowest concentration of SARS-CoV-2 viral copies this assay can detect is 138 copies/mL. A negative result does not preclude SARS-Cov-2 infection and should not be used as the sole basis for treatment or other patient management decisions. A negative result may occur with  improper specimen collection/handling, submission of specimen other than nasopharyngeal swab, presence of viral mutation(s) within the areas targeted by this assay, and inadequate number of viral copies(<138 copies/mL). A negative result must be combined with clinical observations, patient history, and epidemiological information. The expected result is Negative.  Fact Sheet for Patients:  BloggerCourse.com  Fact Sheet for Healthcare Providers:  SeriousBroker.it  This test is no t yet approved or cleared by the [place] FDA and  has been authorized for detection and/or diagnosis of SARS-CoV-2 by FDA under an Emergency Use Authorization (EUA). This EUA will remain  in effect (meaning this test can be used) for the duration of the COVID-19 declaration under Section 564(b)(1) of the Act, 21 U.S.C.section 360bbb-3(b)(1), unless the  authorization is terminated  or revoked sooner.       Influenza A by PCR NEGATIVE NEGATIVE Final   Influenza B by PCR NEGATIVE NEGATIVE Final    Comment: (NOTE) The Xpert Xpress SARS-CoV-2/FLU/RSV plus assay is intended as an aid in the diagnosis of influenza from Nasopharyngeal swab specimens and should not be used as a sole basis for treatment. Nasal washings and aspirates are unacceptable for Xpert Xpress SARS-CoV-2/FLU/RSV testing.  Fact Sheet for Patients: BloggerCourse.com  Fact Sheet for Healthcare Providers: SeriousBroker.it  This test is not yet approved or cleared by the [place] FDA and has been authorized for detection and/or diagnosis of SARS-CoV-2 by FDA under an Emergency Use Authorization (EUA). This EUA will remain in effect (meaning this test can be used) for the duration of the COVID-19 declaration under Section 564(b)(1) of the Act, 21 U.S.C. section 360bbb-3(b)(1), unless the authorization is terminated or revoked.     Resp Syncytial Virus by PCR NEGATIVE NEGATIVE Final    Comment: (NOTE) Fact Sheet for Patients: BloggerCourse.com  Fact Sheet for Healthcare Providers: SeriousBroker.it  This test is not yet approved or cleared by the [place] FDA and has been authorized for detection and/or diagnosis of SARS-CoV-2 by FDA under an Emergency Use Authorization (EUA). This EUA will remain in effect (meaning this test can be used) for the duration of the COVID-19 declaration under Section 564(b)(1) of the Act, 21 U.S.C. section 360bbb-3(b)(1), unless the authorization is terminated or revoked.  Performed at [facility], [address] [place], [place] [address]      Radiological Exams on Admission: No results found.  EKG: Independently reviewed. Sinus tachycardia, rate 103, PVC.   Assessment/Plan   1. Intractable N/V   - Patient has had difficulty tolerating anything by mouth since 10/20/23 despite taking antiemetics at home   - Chronic abdominal pain in unchanged  and he is not tender on exam  - He has improved with treatment in ED and wants to drink a soda  - Check KUB, trial clears, continue antiemetics and IVF, monitor/correct electrolytes   2. Gastric cancer  - Completed cycle 5 of FOLFOX/nivolumab on 10/18/23 under the care of Dr. Truett Perna    3. Hx of DVT  - Use IV heparin until he can tolerate oral medications   4. Hyponatremia; hypokalemia  - Serum sodium 133 and potassium 3.0 in setting of N/V with hypovolemia    - Continue IVF hydration, replace potassium, repeat chem panel in am    5. Hypertension  - Treat as-needed only for now    DVT prophylaxis: IV heparin, Eliquis pta  Code Status: Full   Level of Care: Level of care: Telemetry Family Communication: None present   Disposition Plan:  Patient is from: home  Anticipated d/c is to: TBD Anticipated d/c date is: TBD Patient currently: Pending tolerance of adequate oral intake  Consults called: GI  Admission status: Observation     Briscoe Deutscher, MD [facility]  10/23/2023, 5:16 PM

## 2023-10-24 ENCOUNTER — Observation Stay (HOSPITAL_COMMUNITY)

## 2023-10-24 DIAGNOSIS — R112 Nausea with vomiting, unspecified: Secondary | ICD-10-CM | POA: Diagnosis not present

## 2023-10-24 LAB — BASIC METABOLIC PANEL
Anion gap: 10 (ref 5–15)
BUN: 11 mg/dL (ref 6–20)
CO2: 25 mmol/L (ref 22–32)
Calcium: 8.4 mg/dL — ABNORMAL LOW (ref 8.9–10.3)
Chloride: 95 mmol/L — ABNORMAL LOW (ref 98–111)
Creatinine, Ser: 0.65 mg/dL (ref 0.61–1.24)
GFR, Estimated: >60 mL/min (ref >60)
Glucose, Bld: 80 mg/dL (ref 70–99)
Potassium: 3.3 mmol/L — ABNORMAL LOW (ref 3.5–5.1)
Sodium: 130 mmol/L — ABNORMAL LOW (ref 135–145)

## 2023-10-24 LAB — HEPATIC FUNCTION PANEL
ALT: 8 U/L (ref 0–44)
AST: 21 U/L (ref 15–41)
Albumin: 2.3 g/dL — ABNORMAL LOW (ref 3.5–5.0)
Alkaline Phosphatase: 117 U/L (ref 38–126)
Bilirubin, Direct: <0.1 mg/dL (ref 0.0–0.2)
Total Bilirubin: 0.8 mg/dL (ref 0.0–1.2)
Total Protein: 5 g/dL — ABNORMAL LOW (ref 6.5–8.1)

## 2023-10-24 LAB — CBC
HCT: 33.9 % — ABNORMAL LOW (ref 39.0–52.0)
Hemoglobin: 10.9 g/dL — ABNORMAL LOW (ref 13.0–17.0)
MCH: 29 pg (ref 26.0–34.0)
MCHC: 32.2 g/dL (ref 30.0–36.0)
MCV: 90.2 fL (ref 80.0–100.0)
Platelets: 188 10*3/uL (ref 150–400)
RBC: 3.76 MIL/uL — ABNORMAL LOW (ref 4.22–5.81)
RDW: 15.4 % (ref 11.5–15.5)
WBC: 22.2 10*3/uL — ABNORMAL HIGH (ref 4.0–10.5)
nRBC: 0 % (ref 0.0–0.2)

## 2023-10-24 LAB — APTT: aPTT: 97 s — ABNORMAL HIGH (ref 24–36)

## 2023-10-24 LAB — HEPARIN LEVEL (UNFRACTIONATED): Heparin Unfractionated: >1.1 [IU]/mL — ABNORMAL HIGH (ref 0.30–0.70)

## 2023-10-24 MED ORDER — MAGNESIUM CITRATE PO SOLN
1.0000 | Freq: Once | ORAL | Status: AC
  Administered 2023-10-24: 1 via ORAL
  Filled 2023-10-24: qty 296

## 2023-10-24 MED ORDER — SORBITOL 70 % SOLN
30.0000 mL | Freq: Every day | Status: DC | PRN
  Administered 2023-10-29: 30 mL via ORAL
  Filled 2023-10-24 (×4): qty 30

## 2023-10-24 MED ORDER — SMOG ENEMA
400.0000 mL | Freq: Once | RECTAL | Status: DC
  Filled 2023-10-24: qty 960

## 2023-10-24 MED ORDER — SCOPOLAMINE 1 MG/3DAYS TD PT72
1.0000 | MEDICATED_PATCH | TRANSDERMAL | Status: DC
  Administered 2023-10-24 – 2023-10-30 (×3): 1.5 mg via TRANSDERMAL
  Filled 2023-10-24 (×3): qty 1

## 2023-10-24 MED ORDER — METOCLOPRAMIDE HCL 5 MG/ML IJ SOLN
10.0000 mg | Freq: Four times a day (QID) | INTRAMUSCULAR | Status: DC
  Administered 2023-10-24 – 2023-11-01 (×33): 10 mg via INTRAVENOUS
  Filled 2023-10-24 (×33): qty 2

## 2023-10-24 NOTE — TOC Initial Note (Signed)
 Transition of Care [facility]) - Initial/Assessment Note    Patient Details  Name: [name] MRN: [id number] Date of Birth: 08-Jun-1961  [facility]) CM/SW Contact:    Jessie Foot, RN Phone Number: 10/24/2023, 3:20 PM  Clinical Narrative:                 Presented for nausea vomiting and abdominal pain. PTA independent with limited driving. Lives at home with spouse and granddaughter. PCP and insurance verified. No DME or HH. Food insecurity address with food resources added to discharge summary. Discharge transportation is spouse via private vehicle. No TOC additional needs identified.  Expected Discharge Plan: Home/Self Care Barriers to Discharge: Continued Medical Work up   Patient Goals and CMS Choice Patient states their goals for this hospitalization and ongoing recovery are:: Return home          Expected Discharge Plan and Services   Discharge Planning Services: CM Consult   Living arrangements for the past 2 months: Single Family Home                 DME Arranged: N/A DME Agency: NA       HH Arranged: NA HH Agency: NA        Prior Living Arrangements/Services Living arrangements for the past 2 months: Single Family Home Lives with:: Spouse, Minor Children (Granddaughter) Patient language and need for interpreter reviewed:: Yes Do you feel safe going back to the place where you live?: Yes      Need for Family Participation in Patient Care: No (Comment) Care giver support system in place?: Yes (comment)   Criminal Activity/Legal Involvement Pertinent to Current Situation/Hospitalization: No - Comment as needed  Activities of Daily Living   ADL Screening (condition at time of admission) Independently performs ADLs?: No Does the patient have a NEW difficulty with bathing/dressing/toileting/self-feeding that is expected to last >3 days?: Yes (Initiates electronic notice to provider for possible OT consult) Does the patient have a NEW  difficulty with getting in/out of bed, walking, or climbing stairs that is expected to last >3 days?: Yes (Initiates electronic notice to provider for possible PT consult) Does the patient have a NEW difficulty with communication that is expected to last >3 days?: No Is the patient deaf or have difficulty hearing?: No Does the patient have difficulty seeing, even when wearing glasses/contacts?: No Does the patient have difficulty concentrating, remembering, or making decisions?: No  Permission Sought/Granted Permission sought to share information with : Case Manager Permission granted to share information with : Yes, Verbal Permission Granted              Emotional Assessment Appearance:: Appears older than stated age Attitude/Demeanor/Rapport: Engaged Affect (typically observed): Accepting, Appropriate Orientation: : Oriented to Self, Oriented to Place, Oriented to  Time, Oriented to Situation Alcohol / Substance Use: Not Applicable Psych Involvement: No (comment)  Admission diagnosis:  Dehydration [E86.0] Hypokalemia [E87.6] Intractable nausea and vomiting [R11.2] Nausea and vomiting, unspecified vomiting type [R11.2] Patient Active Problem List   Diagnosis Date Noted   Intractable nausea and vomiting 09/27/2023   Hypokalemia 09/27/2023   Hyponatremia 09/27/2023   Gastric cancer (HCC) 08/03/2023   Other ascites 07/19/2023   Abdominal pain, chronic, epigastric 07/19/2023   Abnormal loss of weight 07/18/2023   Acute gastritis without hemorrhage 07/18/2023   GI bleed 07/16/2023   Abnormal CT of the abdomen 07/16/2023   Status post total replacement of left hip 02/03/2023   Primary osteoarthritis of left  hip 02/02/2023   Avascular necrosis of bone of left hip (HCC) 12/23/2022   High risk heterosexual behavior 02/05/2021   History of colonic polyps 11/04/2019   PCP NOTES >>>>>>>>>>>>>> 07/26/2016   GERD (gastroesophageal reflux disease) 07/26/2016   Annual physical exam  07/25/2016   HTN (hypertension) 09/16/2014   Nausea and vomiting in adult 11/29/2013   Long term current use of anticoagulant 10/06/2010   Renal infarct (HCC) 10/06/2010   Dyslipidemia 09/03/2009   CHEST PAIN-UNSPECIFIED 09/03/2009   SHOULDER PAIN, RIGHT 03/02/2009   PERIPHERAL VASCULAR DISEASE 02/11/2009   SKIN LESION 02/04/2008   Polysubstance abuse (HCC) 05/28/2007   DISORDER, VASCULAR, KIDNEY 05/28/2007   DVT, HX OF 12/25/2006   PCP:  Wanda Plump, MD Pharmacy:   [facility] [address] [place], [place] - [address] [place] [address] Phone: [phone] Fax: [phone]     Social Drivers of Health (SDOH) Social History: SDOH Screenings   Food Insecurity: Food Insecurity Present (10/23/2023)  Housing: Low Risk  (10/23/2023)  Transportation Needs: No Transportation Needs (10/23/2023)  Utilities: Not At Risk (10/23/2023)  Recent Concern: Utilities - At Risk (07/25/2023)  Depression (PHQ2-9): Medium Risk (07/25/2023)  Social Connections: Socially Integrated (10/23/2023)  Tobacco Use: High Risk (10/23/2023)   SDOH Interventions: Food Insecurity Interventions: [employer] Provided (Added to discharge summary)   Readmission Risk Interventions     No data to display

## 2023-10-24 NOTE — Plan of Care (Signed)

## 2023-10-24 NOTE — Progress Notes (Signed)
 Initial Nutrition Assessment  DOCUMENTATION CODES:   Severe malnutrition in context of chronic illness, Underweight  INTERVENTION:   -Boost Breeze or Ensure Plus High Protein -whichever pt can tolerate best  -If patient continues to have N/V symptoms, will need to consider placement of J-tube for enteral feeds.  -Multivitamin with minerals daily once tolerating PO  NUTRITION DIAGNOSIS:   Severe Malnutrition related to chronic illness, cancer and cancer related treatments as evidenced by severe fat depletion, severe muscle depletion, energy intake < 75% for > or equal to 1 month, percent weight loss.  GOAL:   Patient will meet greater than or equal to 90% of their needs  MONITOR:   PO intake, Supplement acceptance  REASON FOR ASSESSMENT:   Consult Assessment of nutrition requirement/status  ASSESSMENT:   63 y.o. male with medical history significant for hypertension, PAD, DVT on Eliquis, and gastric cancer undergoing treatment with FOLFOX/nivolumab who presents with severe nausea and vomiting.  Patient in room, no family at bedside. Currently pt states he has not vomited but has not moved from his bed. Sipping on an Ensure (consumed ~50% so far). Staying down, however, pt states foods still come back up 6 hours later sometimes. Best tolerated foods has been fruit cocktail, oatmeal and jello. Pt states he has not had a BM since Saturday (3/15). Per MD, GI has been consulted.  Pt's symptoms of intolerance to PO has persisted since January 2025. Has had weight loss for a year given cancer diagnosis. Pt with severe malnutrition. Likely needs nutrition support. Given pt with gastric cancer with recurrent ascites, recommend J-tube.   Per weight records, pt has lost 53 lbs since March 2024 (29% wt loss x 1 year, significant for time frame).  Medications: Mg citrate, Lactated ringers, Reglan  Labs reviewed: Low Na Low K   NUTRITION - FOCUSED PHYSICAL EXAM:  Flowsheet Row Most  Recent Value  Orbital Region Moderate depletion  Upper Arm Region Severe depletion  Thoracic and Lumbar Region Moderate depletion  Buccal Region Severe depletion  Temple Region Severe depletion  Clavicle Bone Region Severe depletion  Clavicle and Acromion Bone Region Severe depletion  Scapular Bone Region Severe depletion  Dorsal Hand Severe depletion  Patellar Region Severe depletion  Anterior Thigh Region Severe depletion  Posterior Calf Region Severe depletion  Edema (RD Assessment) None  Hair Reviewed  Eyes Reviewed  Mouth Reviewed  Skin Reviewed  [dry, flaky]  Nails Reviewed       Diet Order:   Diet Order             Diet clear liquid Room service appropriate? Yes; Fluid consistency: Thin  Diet effective now                   EDUCATION NEEDS:   No education needs have been identified at this time  Skin:  Skin Assessment: Reviewed RN Assessment  Last BM:  3/14  Height:   Ht Readings from Last 1 Encounters:  10/23/23 6\' 1"  (1.854 m)    Weight:   Wt Readings from Last 1 Encounters:  10/23/23 57.6 kg    BMI:  Body mass index is 16.76 kg/m.  Estimated Nutritional Needs:   Kcal:  2300-2500  Protein:  115-125g  Fluid:  2.3L/day   Tilda Franco, MS, RD, LDN Inpatient Clinical Dietitian Contact via Secure chat

## 2023-10-24 NOTE — Progress Notes (Signed)
 PHARMACY - ANTICOAGULATION CONSULT NOTE  Pharmacy Consult for Heparin Indication:  h/o VTE  Allergies  Allergen Reactions   Pravastatin Itching   Simvastatin Itching    Patient Measurements: Height: 6\' 1"  (185.4 cm) Weight: 57.6 kg (127 lb) IBW/kg (Calculated) : 79.9 Heparin Dosing Weight: 62.4 kg  Vital Signs: Temp: 98.2 F (36.8 C) (03/18 0611) Temp Source: Oral (03/18 0611) BP: 101/76 (03/18 0611) Pulse Rate: 89 (03/18 0611)  Labs: Recent Labs    10/23/23 1603 10/23/23 1851 10/24/23 0537  HGB 13.3  --  10.9*  HCT 38.9*  --  33.9*  PLT 253  --  188  APTT  --  32 97*  HEPARINUNFRC  --  >1.10* >1.10*  CREATININE 0.77  --  0.65    Estimated Creatinine Clearance: 80 mL/min (by C-G formula based on SCr of 0.65 mg/dL).   Medical History: Past Medical History:  Diagnosis Date   Chest pain    stres test neg x 2, cath 5/12; minimal LAD irregs; no obs CAD; normal LVF   Clotting disorder (HCC)    dvt   Deep vein thrombosis (HCC) 10/06/2010   GERD (gastroesophageal reflux disease)    History of DVT of lower extremity    on chronic coumadin   Hypertension    Internal hemorrhoids    Cscope 2007   PAD (peripheral artery disease) (HCC)    a. left leg ischemia tx wtih embolectomy of left fem, pop, tib arteries with Dr. Edilia Bo - 08/2006;  b. known occlusion of right pop with collats - med Rx;  c.ABI's 8/09: R 1.0; L 0.91    PFO (patent foramen ovale)    per 08/2006 discharge summary, TEE showed EF 60%, small PFO with minimal right-to-left shunt with Valsalva; not felt to be the source of emboli, lifelong Coumadin recommended   Pneumonia    history of   Polysubstance abuse (HCC)    marijuana only   Renal infarct (HCC)    R    Assessment: Active Problem(s): Emesis Has been taking Protonix, Carafate and was recently started on [name] for appetite stimulation but continues to vomit.   AC/Heme: Eliquis PTA (LD 3/17 1000) for h/o VTE, Hgb 13.3, Plts 253   Today,  10/24/23 HL remains elevated, expected with recent DOAC use  aPTT is 97 sec, therapeutic  Hgb 10.9 dropped from previous level Plt WNL  Scr <1   Goal of Therapy:  aPTT 66-102 seconds Monitor platelets by anticoagulation protocol: Yes   Plan:  Continue heparin at 1000 units/hr Daily aPTT, CBC, HL. Monitor for signs and symptoms of bleeding    Adalberto Cole, PharmD, BCPS 10/24/2023 8:38 AM

## 2023-10-24 NOTE — Plan of Care (Signed)

## 2023-10-24 NOTE — Progress Notes (Signed)
 [facility] Progress Note  Patient: [name]     ZOX:[id number]  DOA: 10/23/2023   PCP: Wanda Plump, MD       Brief hospital course: This is a 63 year old male with gastric cancer who is undergoing chemotherapy.  He has chronic issues with nausea vomiting and abdominal pain however, he feels worse over the past few days.  He states that he is having episodes of sudden vomiting and has been unable to tolerate liquids and solids.  He has also had upper abdominal pain.  No complaints of hematemesis. WBC count noted to be elevated at 35,900.  Treated with antiemetics and IV fluids however, states that no antiemetics are effective for him.  Subjective:  Has not had any vomiting since being in the hospital however continues to have abdominal pain.  Assessment and Plan: Principal Problem:   Intractable nausea and vomiting-gastric cancer with metastasis to the liver and malignant ascites Leukocytosis - WBC improved to 22.2 - Most likely secondary to gastric cancer and ongoing chemotherapy treatments-patient also gets regular paracentesis - On exam, abdomen is distended and CT scan reveals a moderate amount of stool-laxatives ordered - Check ultrasound abdomen to look for ascites- ? If he has SBP - Will add Reglan and scopolamine patch - Continue [name] - Continue clear liquid diet - GI consulted as well   Active Problems: Hypokalemia and hyponatremia - Replace and follow  History of DVT - Continue IV heparin    Code Status: Full Code Total time on patient care: 35 minutes DVT prophylaxis: Heparin infusion  Objective:   Vitals:   10/23/23 1813 10/23/23 1939 10/23/23 2204 10/24/23 0611  BP: 116/89  103/80 101/76  Pulse: 91  87 89  Resp: 16  17 18   Temp: 98 F (36.7 C)  98.1 F (36.7 C) 98.2 F (36.8 C)  TempSrc:   Oral Oral  SpO2: 100%  100% 98%  Weight:  57.6 kg    Height:  6\' 1"  (1.854 m)     Filed Weights   10/23/23 1939  Weight: 57.6 kg    Exam: General exam: Appears comfortable  HEENT: oral mucosa moist Respiratory system: Clear to auscultation.  Cardiovascular system: S1 & S2 heard  Gastrointestinal system: Abdomen soft, tender in mid abdomen, distended. Normal bowel sounds   Extremities: No cyanosis, clubbing or edema Psychiatry:  Mood & affect appropriate.      CBC: Recent Labs  Lab 10/18/23 0752 10/23/23 1603 10/24/23 0537  WBC 10.1 35.9* 22.2*  NEUTROABS 5.9 34.5*  --   HGB 13.5 13.3 10.9*  HCT 40.8 38.9* 33.9*  MCV 87.4 87.8 90.2  PLT 307 253 188   Basic Metabolic Panel: Recent Labs  Lab 10/18/23 0752 10/23/23 1603 10/24/23 0537  NA 135 133* 130*  K 3.8 3.0* 3.3*  CL 98 92* 95*  CO2 23 29 25   GLUCOSE 107* 118* 80  BUN 11 14 11   CREATININE 1.09 0.77 0.65  CALCIUM 9.6 8.9 8.4*  MG  --  2.1  --   PHOS  --  3.3  --      Scheduled Meds:  Chlorhexidine Gluconate Cloth  6 each Topical Daily   feeding supplement  1 Container Oral TID BM   metoCLOPramide (REGLAN) injection  10 mg Intravenous Q6H   pantoprazole (PROTONIX) IV  40 mg Intravenous QHS   scopolamine  1 patch Transdermal Q72H   sodium chloride flush  3 mL Intravenous Q12H   SMOG  400 mL Rectal  Once    Imaging and lab data personally reviewed   Author: Calvert Cantor  10/24/2023 11:07 AM  To contact [facility]>   Check the care team in [facility] and look for the attending/consulting [facility] provider listed  Log into [email or web address] and use [place]'s universal password   Go to> "[facility]"  and find provider  If you still have difficulty reaching the provider, please page the [facility] (Director on Call) for the Hospitalists listed on amion

## 2023-10-24 NOTE — Discharge Instructions (Signed)
 FOOD PANTRY  [employer] [address] [phone]   [facility] [address] [place] [address] [phone]   [facility] [address] [place] [phone]   [facility] [address] [phone]   [facility] - [facility] [address] [place], [place] [address] [phone]   UTILITIES [facility] [address] [place] [phone]  Rental assistance/rental hotline: [phone] ext. 340.  Utility assistance/utility hotline: [phone] ext. [address] Department of IT consultant (heating/cooling and water assistance) [phone] (rental and utility assistance) [phone]   [employer] - call 211   TRANSPORTATION  [facility] [address] [place], [place] [address] [phone]   [employer] [place]  [employer] Reservations Line: [phone]   Passengers can simply call the [employer] reservations number at [phone] for pickup. However, with [employer] same-day service is available with at least two hour notice Monday through Friday. You will need your [employer] [place] client ID# when you call for reservations. If you do not know it, you can call [phone] to request it. [employer] offers a flat fare of $8.50 per trip. This will cover travel anywhere within the city limits of [place].   [place] [address] also has on demand information for what resources are available in the area.

## 2023-10-25 ENCOUNTER — Inpatient Hospital Stay (HOSPITAL_COMMUNITY)

## 2023-10-25 ENCOUNTER — Telehealth: Payer: Self-pay | Admitting: *Deleted

## 2023-10-25 DIAGNOSIS — R634 Abnormal weight loss: Secondary | ICD-10-CM | POA: Diagnosis not present

## 2023-10-25 DIAGNOSIS — Q2112 Patent foramen ovale: Secondary | ICD-10-CM | POA: Diagnosis not present

## 2023-10-25 DIAGNOSIS — T183XXA Foreign body in small intestine, initial encounter: Secondary | ICD-10-CM | POA: Diagnosis not present

## 2023-10-25 DIAGNOSIS — D696 Thrombocytopenia, unspecified: Secondary | ICD-10-CM

## 2023-10-25 DIAGNOSIS — C799 Secondary malignant neoplasm of unspecified site: Secondary | ICD-10-CM | POA: Diagnosis present

## 2023-10-25 DIAGNOSIS — K5900 Constipation, unspecified: Secondary | ICD-10-CM | POA: Diagnosis not present

## 2023-10-25 DIAGNOSIS — C169 Malignant neoplasm of stomach, unspecified: Secondary | ICD-10-CM | POA: Diagnosis not present

## 2023-10-25 DIAGNOSIS — F17210 Nicotine dependence, cigarettes, uncomplicated: Secondary | ICD-10-CM | POA: Diagnosis present

## 2023-10-25 DIAGNOSIS — D649 Anemia, unspecified: Secondary | ICD-10-CM

## 2023-10-25 DIAGNOSIS — R1110 Vomiting, unspecified: Secondary | ICD-10-CM | POA: Diagnosis not present

## 2023-10-25 DIAGNOSIS — C800 Disseminated malignant neoplasm, unspecified: Secondary | ICD-10-CM | POA: Diagnosis not present

## 2023-10-25 DIAGNOSIS — Z681 Body mass index (BMI) 19 or less, adult: Secondary | ICD-10-CM | POA: Diagnosis not present

## 2023-10-25 DIAGNOSIS — D61810 Antineoplastic chemotherapy induced pancytopenia: Secondary | ICD-10-CM | POA: Diagnosis not present

## 2023-10-25 DIAGNOSIS — I70203 Unspecified atherosclerosis of native arteries of extremities, bilateral legs: Secondary | ICD-10-CM | POA: Diagnosis present

## 2023-10-25 DIAGNOSIS — K3189 Other diseases of stomach and duodenum: Secondary | ICD-10-CM | POA: Diagnosis present

## 2023-10-25 DIAGNOSIS — Z7901 Long term (current) use of anticoagulants: Secondary | ICD-10-CM | POA: Diagnosis not present

## 2023-10-25 DIAGNOSIS — K2970 Gastritis, unspecified, without bleeding: Secondary | ICD-10-CM | POA: Diagnosis present

## 2023-10-25 DIAGNOSIS — R112 Nausea with vomiting, unspecified: Secondary | ICD-10-CM | POA: Diagnosis not present

## 2023-10-25 DIAGNOSIS — D630 Anemia in neoplastic disease: Secondary | ICD-10-CM | POA: Diagnosis present

## 2023-10-25 DIAGNOSIS — T182XXA Foreign body in stomach, initial encounter: Secondary | ICD-10-CM | POA: Diagnosis not present

## 2023-10-25 DIAGNOSIS — R5081 Fever presenting with conditions classified elsewhere: Secondary | ICD-10-CM | POA: Diagnosis not present

## 2023-10-25 DIAGNOSIS — D709 Neutropenia, unspecified: Secondary | ICD-10-CM | POA: Diagnosis not present

## 2023-10-25 DIAGNOSIS — Z1152 Encounter for screening for COVID-19: Secondary | ICD-10-CM | POA: Diagnosis not present

## 2023-10-25 DIAGNOSIS — K449 Diaphragmatic hernia without obstruction or gangrene: Secondary | ICD-10-CM | POA: Diagnosis not present

## 2023-10-25 DIAGNOSIS — C786 Secondary malignant neoplasm of retroperitoneum and peritoneum: Secondary | ICD-10-CM | POA: Diagnosis present

## 2023-10-25 DIAGNOSIS — R188 Other ascites: Secondary | ICD-10-CM | POA: Diagnosis not present

## 2023-10-25 DIAGNOSIS — E785 Hyperlipidemia, unspecified: Secondary | ICD-10-CM | POA: Diagnosis not present

## 2023-10-25 DIAGNOSIS — K5989 Other specified functional intestinal disorders: Secondary | ICD-10-CM | POA: Diagnosis not present

## 2023-10-25 DIAGNOSIS — E43 Unspecified severe protein-calorie malnutrition: Secondary | ICD-10-CM | POA: Diagnosis present

## 2023-10-25 DIAGNOSIS — R109 Unspecified abdominal pain: Secondary | ICD-10-CM | POA: Diagnosis not present

## 2023-10-25 DIAGNOSIS — E860 Dehydration: Secondary | ICD-10-CM | POA: Diagnosis not present

## 2023-10-25 DIAGNOSIS — K567 Ileus, unspecified: Secondary | ICD-10-CM | POA: Diagnosis present

## 2023-10-25 DIAGNOSIS — I10 Essential (primary) hypertension: Secondary | ICD-10-CM | POA: Diagnosis present

## 2023-10-25 DIAGNOSIS — R64 Cachexia: Secondary | ICD-10-CM | POA: Diagnosis present

## 2023-10-25 DIAGNOSIS — B370 Candidal stomatitis: Secondary | ICD-10-CM | POA: Diagnosis present

## 2023-10-25 DIAGNOSIS — C163 Malignant neoplasm of pyloric antrum: Secondary | ICD-10-CM | POA: Diagnosis not present

## 2023-10-25 DIAGNOSIS — K209 Esophagitis, unspecified without bleeding: Secondary | ICD-10-CM | POA: Diagnosis not present

## 2023-10-25 DIAGNOSIS — E876 Hypokalemia: Secondary | ICD-10-CM | POA: Diagnosis not present

## 2023-10-25 DIAGNOSIS — G893 Neoplasm related pain (acute) (chronic): Secondary | ICD-10-CM | POA: Diagnosis present

## 2023-10-25 DIAGNOSIS — C787 Secondary malignant neoplasm of liver and intrahepatic bile duct: Secondary | ICD-10-CM | POA: Diagnosis present

## 2023-10-25 DIAGNOSIS — K2100 Gastro-esophageal reflux disease with esophagitis, without bleeding: Secondary | ICD-10-CM | POA: Diagnosis not present

## 2023-10-25 DIAGNOSIS — R180 Malignant ascites: Secondary | ICD-10-CM | POA: Diagnosis not present

## 2023-10-25 DIAGNOSIS — D689 Coagulation defect, unspecified: Secondary | ICD-10-CM | POA: Diagnosis present

## 2023-10-25 DIAGNOSIS — Z86718 Personal history of other venous thrombosis and embolism: Secondary | ICD-10-CM | POA: Diagnosis not present

## 2023-10-25 DIAGNOSIS — E871 Hypo-osmolality and hyponatremia: Secondary | ICD-10-CM | POA: Diagnosis not present

## 2023-10-25 LAB — CBC
HCT: 32.1 % — ABNORMAL LOW (ref 39.0–52.0)
Hemoglobin: 10.4 g/dL — ABNORMAL LOW (ref 13.0–17.0)
MCH: 29.1 pg (ref 26.0–34.0)
MCHC: 32.4 g/dL (ref 30.0–36.0)
MCV: 89.9 fL (ref 80.0–100.0)
Platelets: 147 10*3/uL — ABNORMAL LOW (ref 150–400)
RBC: 3.57 MIL/uL — ABNORMAL LOW (ref 4.22–5.81)
RDW: 15.3 % (ref 11.5–15.5)
WBC: 9.3 10*3/uL (ref 4.0–10.5)
nRBC: 0 % (ref 0.0–0.2)

## 2023-10-25 LAB — APTT
aPTT: 142 s — ABNORMAL HIGH (ref 24–36)
aPTT: 99 s — ABNORMAL HIGH (ref 24–36)
aPTT: 118 s — ABNORMAL HIGH (ref 24–36)

## 2023-10-25 LAB — BASIC METABOLIC PANEL
Anion gap: 6 (ref 5–15)
BUN: 8 mg/dL (ref 6–20)
CO2: 25 mmol/L (ref 22–32)
Calcium: 8 mg/dL — ABNORMAL LOW (ref 8.9–10.3)
Chloride: 98 mmol/L (ref 98–111)
Creatinine, Ser: 0.63 mg/dL (ref 0.61–1.24)
GFR, Estimated: >60 mL/min (ref >60)
Glucose, Bld: 75 mg/dL (ref 70–99)
Potassium: 3.1 mmol/L — ABNORMAL LOW (ref 3.5–5.1)
Sodium: 129 mmol/L — ABNORMAL LOW (ref 135–145)

## 2023-10-25 LAB — MAGNESIUM: Magnesium: 2.3 mg/dL (ref 1.7–2.4)

## 2023-10-25 LAB — HEPARIN LEVEL (UNFRACTIONATED): Heparin Unfractionated: 0.79 [IU]/mL — ABNORMAL HIGH (ref 0.30–0.70)

## 2023-10-25 MED ORDER — POTASSIUM CHLORIDE CRYS ER 20 MEQ PO TBCR
40.0000 meq | EXTENDED_RELEASE_TABLET | ORAL | Status: AC
  Administered 2023-10-25 (×2): 40 meq via ORAL
  Filled 2023-10-25: qty 4
  Filled 2023-10-25: qty 2

## 2023-10-25 MED ORDER — IOHEXOL 9 MG/ML PO SOLN
500.0000 mL | ORAL | Status: AC
Start: 2023-10-25 — End: 2023-10-25
  Administered 2023-10-25 (×2): 500 mL via ORAL

## 2023-10-25 MED ORDER — HEPARIN (PORCINE) 25000 UT/250ML-% IV SOLN
1150.0000 [IU]/h | INTRAVENOUS | Status: AC
  Administered 2023-10-27 – 2023-10-28 (×2): 950 [IU]/h via INTRAVENOUS
  Administered 2023-10-29 – 2023-10-30 (×2): 1150 [IU]/h via INTRAVENOUS
  Filled 2023-10-25 (×6): qty 250

## 2023-10-25 MED ORDER — IOHEXOL 300 MG/ML  SOLN
100.0000 mL | Freq: Once | INTRAMUSCULAR | Status: AC | PRN
  Administered 2023-10-25: 100 mL via INTRAVENOUS

## 2023-10-25 MED ORDER — IOHEXOL 9 MG/ML PO SOLN
ORAL | Status: AC
  Filled 2023-10-25: qty 1000

## 2023-10-25 NOTE — Consult Note (Addendum)
 Consultation  Referring Provider:  Dr. Lowell Guitar    Primary Care Physician:  Wanda Plump, MD Primary Gastroenterologist: Dr. Myrtie Neither after last hospitalization        Reason for Consultation:  Nausea and vomiting in the setting of gastric cancer            HPI:   [name] is a 63 y.o. male with a past medical history as listed below including hypertension, PAD, DVT on Eliquis and gastric cancer undergoing treatment with FOLFOX/nivolumab who presented to the hospital on 10/23/2023 with severe nausea and vomiting.    At time of presentation describe nausea with recurrent bouts of vomiting since 10/20/2023.  No change in chronic intermittent abdominal pain.    Today, patient explains that about a month ago he became severely constipated and they were trying "all sorts of different things to unblock me", around that time started with nausea and vomiting of pretty much anything he would eat.  He has tried multiple products for his constipation since and has gotten his bowels to move.  Among them are MiraLAX.  Here in the hospital mag citrate.  Tells me that he was able to have bowel movements here and there though they were not as regular as prior to chemo would have occasional good days where he could eat bacon and eggs but for the most part if he would eat anything about 20 to 30 minutes later he would vomit it back up.  His symptoms are really not changed here in the hospital with all the therapies implemented.  Describes that when he has bad days of vomiting the pain in his epigastrium is worse.  Otherwise just has a baseline 3-4/10.  Still receiving chemo with his next scheduled on Wednesday, 11/01/2023.  Tells me he had a good bowel movement this morning after drinking MiraLAX and taking some mag citrate.    Does note about an 8 pound weight loss just over the past couple of weeks, total of 50 pounds since his diagnosis of stomach cancer.    Denies fever, chills, blood in his stool or  hematemesis.  ER course: Mild tachycardia, sodium 133, potassium 3.0, albumin 2.6, WBC 35,900 and negative respiratory virus panel  GI history: 07/18/2023 EGD with small hiatal hernia, medium amount of food residue, congested mucosa in the gastric body and antrum, dilated lacteals were found in the duodenum, concerning for metastatic gastric malignancy 12/16/2019 colonoscopy with one 7 mm polyp in the transverse colon, mild diverticulosis in left colon internal hemorrhoids-repeat recommended in 3 years  Past Medical History:  Diagnosis Date   Chest pain    stres test neg x 2, cath 5/12; minimal LAD irregs; no obs CAD; normal LVF   Clotting disorder (HCC)    dvt   Deep vein thrombosis (HCC) 10/06/2010   GERD (gastroesophageal reflux disease)    History of DVT of lower extremity    on chronic coumadin   Hypertension    Internal hemorrhoids    Cscope 2007   PAD (peripheral artery disease) (HCC)    a. left leg ischemia tx wtih embolectomy of left fem, pop, tib arteries with Dr. Edilia Bo - 08/2006;  b. known occlusion of right pop with collats - med Rx;  c.ABI's 8/09: R 1.0; L 0.91    PFO (patent foramen ovale)    per 08/2006 discharge summary, TEE showed EF 60%, small PFO with minimal right-to-left shunt with Valsalva; not felt to be the source of  emboli, lifelong Coumadin recommended   Pneumonia    history of   Polysubstance abuse (HCC)    marijuana only   Renal infarct [facility])    R    Past Surgical History:  Procedure Laterality Date   BIOPSY  07/18/2023   Procedure: BIOPSY;  Surgeon: Sherrilyn Rist, MD;  Location: [facility] ENDOSCOPY;  Service: Gastroenterology;;   CHOLECYSTECTOMY     COLONOSCOPY     ESOPHAGOGASTRODUODENOSCOPY (EGD) WITH PROPOFOL N/A 07/18/2023   Procedure: ESOPHAGOGASTRODUODENOSCOPY (EGD) WITH PROPOFOL;  Surgeon: Sherrilyn Rist, MD;  Location: [facility] ENDOSCOPY;  Service: Gastroenterology;  Laterality: N/A;   INGUINAL HERNIA REPAIR Left 07/16/2019   Procedure:  LAPAROSCOPIC LEFT INGUINAL HERNIA REPAIR WITH MESH;  Surgeon: Axel Filler, MD;  Location: [facility] OR;  Service: General;  Laterality: Left;   IR IMAGING GUIDED PORT INSERTION  08/08/2023   IR PARACENTESIS  07/17/2023   IR PARACENTESIS  10/20/2023   left leg blood clot removal 2009  2009   TONSILLECTOMY     TOTAL HIP ARTHROPLASTY Left 02/03/2023   Procedure: LEFT TOTAL HIP ARTHROPLASTY ANTERIOR APPROACH;  Surgeon: Tarry Kos, MD;  Location: [facility] OR;  Service: Orthopedics;  Laterality: Left;  3-C   WRIST SURGERY Left     Family History  Problem Relation Age of Onset   Hypertension Mother    Breast cancer Mother    Hypertension Father    CAD Father    Diabetes Father    Lung cancer Maternal Uncle    Pancreatic cancer Brother    Prostate cancer Maternal Uncle    Heart disease Paternal Grandmother    Colon cancer Neg Hx    Stomach cancer Neg Hx    Esophageal cancer Neg Hx    Rectal cancer Neg Hx     Social History   Tobacco Use   Smoking status: Every Day    Current packs/day: 1.00    Average packs/day: 1 pack/day for 30.0 years (30.0 ttl pk-yrs)    Types: Cigarettes   Smokeless tobacco: Never   Tobacco comments:    1 to 1.5 ppd   Vaping Use   Vaping status: Never Used  Substance Use Topics   Alcohol use: Yes    Alcohol/week: 21.0 standard drinks of alcohol    Types: 21 Standard drinks or equivalent per week    Comment: 3 drinks per day, every day   Drug use: Yes    Types: Marijuana    Comment: smokes every day   Prior to Admission medications   Medication Sig Start Date End Date Taking? Authorizing Provider  apixaban (ELIQUIS) 5 MG TABS tablet Take 1 tablet by mouth twice daily Patient taking differently: Take 5 mg by mouth See admin instructions. Take 5 mg by mouth in the morning and afternoon 04/21/23  Yes [name], MD  atorvastatin (LIPITOR) 40 MG tablet Take 40 mg by mouth daily. 09/29/23  Yes [provider]  dronabinol ([name]) 2.5 MG capsule Take 1  capsule (2.5 mg total) by mouth 2 (two) times daily before lunch and supper. 10/18/23  Yes Pollyann Samples, NP  HYDROmorphone (DILAUDID) 4 MG tablet Take 1-2 tablets (4-8 mg total) by mouth every 4 (four) hours as needed for severe pain (pain score 7-10). 10/12/23  Yes Rana Snare, NP  lidocaine-prilocaine (EMLA) cream Apply 1 Application topically as needed (Apply to Port 1-2 hours prior to its use). 08/11/23  Yes Ladene Artist, MD  Nicotine (NICODERM CQ TD) Place 1  patch onto the skin daily as needed (for smoking cessation).   Yes [provider]  ondansetron (ZOFRAN) 8 MG tablet TAKE 1 TABLET BY MOUTH EVERY 8 HOURS AS NEEDED (START  3  DAYS  AFTER  CHEMO  AS  NEEDED  FOR  NAUSEA/VOMITING). Patient taking differently: Take 8 mg by mouth every 8 (eight) hours as needed for nausea or vomiting (start 3 days after chemotherapy). 09/18/23  Yes Ladene Artist, MD  pantoprazole (PROTONIX) 40 MG tablet Take 1 tablet (40 mg total) by mouth 2 (two) times daily. 08/22/23  Yes Rana Snare, NP  potassium chloride (KLOR-CON) 10 MEQ tablet Take 10 mEq by mouth 2 (two) times daily. 10/02/23  Yes [provider]  prochlorperazine (COMPAZINE) 10 MG tablet TAKE 1 TABLET BY MOUTH EVERY 6 HOURS AS NEEDED FOR NAUSEA FOR VOMITING Patient taking differently: Take 10 mg by mouth every 6 (six) hours as needed for nausea or vomiting. 09/18/23  Yes Ladene Artist, MD  sorbitol 70 % SOLN Take 15 ml by mouth every 6 hours until BM occurs as directed by physician.  Then take 15 ml daily as needed for constipation. 09/06/23  Yes Ladene Artist, MD  sucralfate (CARAFATE) 1 g tablet Take 1 tablet (1 g total) by mouth 4 (four) times daily -  with meals and at bedtime. Dissolve tablet in water to make liquid 08/11/23  Yes Ladene Artist, MD  aluminum-magnesium hydroxide-simethicone (MAALOX) 200-200-20 MG/5ML SUSP Take 15 mLs by mouth as needed (As needed). Patient not taking: Reported on 10/18/2023    [provider]  EPINEPHrine (EPIPEN 2-PAK) 0.3 mg/0.3 mL IJ SOAJ injection Inject 0.3 mg into the muscle as needed for anaphylaxis. Patient not taking: Reported on 10/04/2023 12/19/22   Wanda Plump, MD  polyethylene glycol (MIRALAX / GLYCOLAX) 17 g packet Take 17 g by mouth daily as needed for mild constipation. Patient not taking: Reported on 10/04/2023 07/20/23   Dorcas Carrow, MD  sennosides-docusate sodium (SENOKOT-S) 8.6-50 MG tablet Take 2 tablets by mouth 2 (two) times daily as needed for constipation (hold for diarrhea). Patient not taking: Reported on 10/04/2023 09/06/23   [provider]   Current Facility-Administered Medications  Medication Dose Route Frequency Provider Last Rate Last Admin   acetaminophen (TYLENOL) tablet 650 mg  650 mg Oral Q6H PRN Opyd, Lavone Neri, MD       Or   acetaminophen (TYLENOL) suppository 650 mg  650 mg Rectal Q6H PRN Opyd, Lavone Neri, MD       Chlorhexidine Gluconate Cloth 2 % PADS 6 each  6 each Topical Daily Opyd, Lavone Neri, MD   6 each at 10/25/23 1021   feeding supplement (BOOST / RESOURCE BREEZE) liquid 1 Container  1 Container Oral TID BM Opyd, Lavone Neri, MD   1 Container at 10/25/23 1021   heparin ADULT infusion 100 units/mL (25000 units/254mL)  850 Units/hr Intravenous Continuous Maurice March, RPH 8.5 mL/hr at 10/25/23 0524 850 Units/hr at 10/25/23 0524   HYDROmorphone (DILAUDID) injection 0.5-1 mg  0.5-1 mg Intravenous Q3H PRN Opyd, Lavone Neri, MD   1 mg at 10/25/23 1248   metoCLOPramide (REGLAN) injection 10 mg  10 mg Intravenous Q6H Rizwan, Ladell Heads, MD   10 mg at 10/25/23 1247   ondansetron (ZOFRAN) tablet 4 mg  4 mg Oral Q6H PRN Opyd, Lavone Neri, MD       Or   ondansetron (ZOFRAN) injection 4 mg  4 mg Intravenous Q6H  PRN Briscoe Deutscher, MD   4 mg at 10/25/23 1021   pantoprazole (PROTONIX) injection 40 mg  40 mg Intravenous QHS Opyd, Lavone Neri, MD   40 mg at 10/24/23 2212   prochlorperazine (COMPAZINE) injection 5 mg  5 mg Intravenous  Q6H PRN Opyd, Lavone Neri, MD   5 mg at 10/25/23 1247   scopolamine (TRANSDERM-SCOP) 1 MG/3DAYS 1.5 mg  1 patch Transdermal Q72H Calvert Cantor, MD   1.5 mg at 10/24/23 1029   sodium chloride flush (NS) 0.9 % injection 10-40 mL  10-40 mL Intracatheter PRN Opyd, Lavone Neri, MD       sodium chloride flush (NS) 0.9 % injection 3 mL  3 mL Intravenous Q12H Opyd, Lavone Neri, MD   3 mL at 10/25/23 1022   sorbitol 70 % solution 30 mL  30 mL Oral Daily PRN Calvert Cantor, MD       sorbitol, magnesium hydroxide, mineral oil, glycerin (SMOG) enema  400 mL Rectal Once Calvert Cantor, MD        Allergies as of 10/23/2023 - Review Complete 10/23/2023  Allergen Reaction Noted   Pravastatin Itching 07/26/2012   Simvastatin Itching 07/26/2012   Review of Systems:    Constitutional: No fever or chills Skin: No rash Cardiovascular: No chest pain Respiratory: No SOB  Gastrointestinal: See HPI and otherwise negative Genitourinary: No dysuria  Neurological: No headache, dizziness or syncope Musculoskeletal: No new muscle or joint pain Hematologic: No bleeding  Psychiatric: No history of depression or anxiety    Physical Exam:  Vital signs in last 24 hours: Temp:  [98 F (36.7 C)-98.5 F (36.9 C)] 98.1 F (36.7 C) (03/19 1155) Pulse Rate:  [80-96] 80 (03/19 1155) Resp:  [16-18] 18 (03/19 0353) BP: (96-113)/(67-85) 113/85 (03/19 1155) SpO2:  [99 %-100 %] 100 % (03/19 1155) Last BM Date : 10/25/23 General:   Pleasant AA male appears to be in NAD, Well developed, Well nourished, alert and cooperative Head:  Normocephalic and atraumatic. Eyes:   PEERL, EOMI. No icterus. Conjunctiva pink. Ears:  Normal auditory acuity. Neck:  Supple Throat: Oral cavity and pharynx without inflammation, swelling or lesion.  Lungs: Respirations even and unlabored. Lungs clear to auscultation bilaterally.   No wheezes, crackles, or rhonchi.  Heart: Normal S1, S2. No MRG. Regular rate and rhythm. No peripheral edema, cyanosis or  pallor.  Abdomen:  Soft, nondistended, nontender. No rebound or guarding. Increased BS all four quadrants. No appreciable masses or hepatomegaly. Rectal:  Not performed.  Msk:  Symmetrical without gross deformities. Peripheral pulses intact.  Extremities:  Without edema, no deformity or joint abnormality.  Neurologic:  Alert and  oriented x4;  grossly normal neurologically.  Skin:   Dry and intact without significant lesions or rashes. Psychiatric: Demonstrates good judgement and reason without abnormal affect or behaviors.   LAB RESULTS: Recent Labs    10/23/23 1603 10/24/23 0537 10/25/23 0400  WBC 35.9* 22.2* 9.3  HGB 13.3 10.9* 10.4*  HCT 38.9* 33.9* 32.1*  PLT 253 188 147*   BMET Recent Labs    10/23/23 1603 10/24/23 0537 10/25/23 0400  NA 133* 130* 129*  K 3.0* 3.3* 3.1*  CL 92* 95* 98  CO2 29 25 25   GLUCOSE 118* 80 75  BUN 14 11 8   CREATININE 0.77 0.65 0.63  CALCIUM 8.9 8.4* 8.0*   LFT Recent Labs    10/24/23 0537  PROT 5.0*  ALBUMIN 2.3*  AST 21  ALT 8  ALKPHOS 117  BILITOT  0.8  BILIDIR <0.1  IBILI NOT CALCULATED   STUDIES: US Abdomen Limited Result Date: 10/24/2023 CLINICAL DATA:  Ascites EXAM: LIMITED ABDOMEN ULTRASOUND FOR ASCITES TECHNIQUE: Limited ultrasound survey for ascites was performed in all four abdominal quadrants. COMPARISON:  09/28/2023 FINDINGS: Moderate ascites identified particularly right hemiabdomen and left lower quadrant. IMPRESSION: Moderate diffuse ascites. Electronically Signed   By: Karen Kays M.D.   On: 10/24/2023 13:24   DG Abd 1 View Result Date: 10/23/2023 CLINICAL DATA:  Nausea, vomiting EXAM: ABDOMEN - 1 VIEW COMPARISON:  None Available. FINDINGS: The bowel gas pattern is normal. No radio-opaque calculi or other significant radiographic abnormality are seen. Moderate stool burden throughout the colon. IMPRESSION: Moderate stool burden.  No acute findings. Electronically Signed   By: Charlett Nose M.D.   On: 10/23/2023 22:38      Impression / Plan:   Impression: 1.  Intractable nausea and vomiting: Difficulty tolerating anything by mouth for the past month here and there, worse over the past 5 days despite taking antiemetics at home, chronic abdominal pain which only worsens when he has a bad day of vomiting, oncology is requesting consultation for possible feeding tube; likely related to gastric cancer +/- chemotherapy 2.  Gastric cancer: Diagnosed December 2024, pathology showed metastatic poorly differentiated carcinoma consistent with primary gastric carcinoma with signet cell features, completed cycle 5 of FOLFOX/nivolumab on 10/18/2023 under the care of Dr. Truett Perna, ultrasound 3/18 showed moderate diffuse ascites, status post paracentesis done 3/14 with 4.6 L removed shows adenocarcinoma, contributing to above 3.  History of DVT: Currently on IV heparin, had been on Eliquis at home but unable to tolerate p.o. here 4.  Hyponatremia/hypokalemia  Plan: 1.  After discussion with Dr. Leone Payor, went ahead and ordered repeat CT abdomen and pelvis with contrast to consider carcinomatosis and outlet obstruction and also possibly contributing to constipation.  Pending results could consider EGD +/- feeding tube with IR. 2.  Continue current diet as tolerated 3.  If plans for procedure in the future, will need to hold Heparin for 6 hours prior 4.  Needs correction of potassium 5.  Continue bowel regimen  Thank you for your kind consultation, we will continue to follow.  Violet Baldy Bowie Delia  10/25/2023, 1:02 PM

## 2023-10-25 NOTE — Telephone Encounter (Addendum)
 Mrs. Canupp called that she has not been able to speak w/MD in hospital. Thought he was supposed to be evaluated for feeding tube, but has heard nothing. Also reports he will need refills on some medications for when he is discharged. NP will reach out to hospitalist, inpatient oncology PA and wife

## 2023-10-25 NOTE — Progress Notes (Signed)
 PHARMACY - ANTICOAGULATION CONSULT NOTE  Pharmacy Consult for heparin Indication:  h/o renal infarct and h/o DVT  (PTA Eliquis on hold)  Allergies  Allergen Reactions   Pravastatin Itching   Simvastatin Itching    Patient Measurements: Height: 6\' 1"  (185.4 cm) Weight: 57.7 kg (127 lb 1.6 oz) IBW/kg (Calculated) : 79.9 Heparin Dosing Weight: 57.6 kg  Vital Signs: Temp: 98.1 F (36.7 C) (03/19 1155) Temp Source: Oral (03/19 1155) BP: 113/85 (03/19 1155) Pulse Rate: 80 (03/19 1155)  Labs: Recent Labs    10/23/23 1603 10/23/23 1603 10/23/23 1851 10/24/23 0537 10/25/23 0400 10/25/23 1230 10/25/23 1825  HGB 13.3  --   --  10.9* 10.4*  --   --   HCT 38.9*  --   --  33.9* 32.1*  --   --   PLT 253  --   --  188 147*  --   --   APTT  --    < > 32 97* 142* 99* 118*  HEPARINUNFRC  --   --  >1.10* >1.10*  --  0.79*  --   CREATININE 0.77  --   --  0.65 0.63  --   --    < > = values in this interval not displayed.    Estimated Creatinine Clearance: 80.1 mL/min (by C-G formula based on SCr of 0.63 mg/dL).   Medical History: Past Medical History:  Diagnosis Date   Chest pain    stres test neg x 2, cath 5/12; minimal LAD irregs; no obs CAD; normal LVF   Clotting disorder (HCC)    dvt   Deep vein thrombosis (HCC) 10/06/2010   GERD (gastroesophageal reflux disease)    History of DVT of lower extremity    on chronic coumadin   Hypertension    Internal hemorrhoids    Cscope 2007   PAD (peripheral artery disease) (HCC)    a. left leg ischemia tx wtih embolectomy of left fem, pop, tib arteries with Dr. Edilia Bo - 08/2006;  b. known occlusion of right pop with collats - med Rx;  c.ABI's 8/09: R 1.0; L 0.91    PFO (patent foramen ovale)    per 08/2006 discharge summary, TEE showed EF 60%, small PFO with minimal right-to-left shunt with Valsalva; not felt to be the source of emboli, lifelong Coumadin recommended   Pneumonia    history of   Polysubstance abuse (HCC)    marijuana  only   Renal infarct [facility])    R    Assessment: Patient is a 63 y.o. male with hx HTN, PAD, renal infarct, stage IV gastric cancer on chemo, and LE DVT in 2008 on Eliquis PTA who presented to the ED on 10/23/23 with c/o severe nausea and vomiting. Pt received last dose (cycle 5) of FOLFOX/nivolumab on 10/18/23. He reported nausea with recurrent bouts of vomiting since 10/20/23, which couldn't be improved by Protonix, Carafate, and [name] at home. Anticoagulation changed to heparin due to poor oral intake secondary to nausea and vomiting and in case invasive intervention is needed.  - Last dose Eliquis was taken on 3/17 at 10am.   Today, 10/25/23: - aPTT collected at 1825 is now supra-therapeutic at 118 secs.  Heparin infusion thru port where lab collected.  - Heparin level 0.79 -elevated due to residual effects of Eliquis. Will adjust heparin using aPTT at this time. Once heparin level & aPTT correlate, then will monitor using heparin levels only. - Hgb 10.4, plt low at 147 - Scr 0.63 -  No bleeding or infusion interruptions noted per RN  Goal of Therapy:  Heparin level 0.3-0.7 units/ml aPTT 66-102 seconds Monitor platelets by anticoagulation protocol: Yes   Plan:  - Decrease heparin drip slightly to 800 units/hr.  Switch infusion to peripheral IV site.  - Check 6 hr aPTT after rate change - Daily CBC& heparin level - Monitor for s/sx bleeding   Junita Push, PharmD  10/25/2023,7:56 PM

## 2023-10-25 NOTE — Plan of Care (Signed)

## 2023-10-25 NOTE — Plan of Care (Signed)
  Problem: Education: Goal: Knowledge of General Education information will improve Description: Including pain rating scale, medication(s)/side effects and non-pharmacologic comfort measures Outcome: Progressing   Problem: Health Behavior/Discharge Planning: Goal: Ability to manage health-related needs will improve Outcome: Progressing   Problem: Clinical Measurements: Goal: Ability to maintain clinical measurements within normal limits will improve Outcome: Progressing Goal: Will remain free from infection Outcome: Progressing Goal: Diagnostic test results will improve Outcome: Progressing Goal: Respiratory complications will improve Outcome: Progressing Goal: Cardiovascular complication will be avoided Outcome: Progressing   Problem: Activity: Goal: Risk for activity intolerance will decrease Outcome: Progressing   Problem: Coping: Goal: Level of anxiety will decrease Outcome: Progressing   Problem: Elimination: Goal: Will not experience complications related to bowel motility Outcome: Progressing Goal: Will not experience complications related to urinary retention Outcome: Progressing   Problem: Safety: Goal: Ability to remain free from injury will improve Outcome: Progressing   Problem: Skin Integrity: Goal: Risk for impaired skin integrity will decrease Outcome: Progressing   Problem: Nutrition: Goal: Adequate nutrition will be maintained Outcome: Not Progressing   Problem: Pain Managment: Goal: General experience of comfort will improve and/or be controlled Outcome: Not Progressing

## 2023-10-25 NOTE — Progress Notes (Signed)
 [name]   DOB:Jan 23, 1961   ZO#:[id number]      ASSESSMENT & PLAN:  Gastric cancer, stage IV - Initially diagnosed December 2024 - Pathology shows metastatic poorly differentiated carcinoma consistent with primary gastric carcinoma, with signet cell features - Patient was started on FOLFOX/nivolumab cycle 1 on 08/16/2023.  Plan for every 2 weeks.  Completed 3 cycles and was admitted in February 2025 with intractable nausea/vomiting and dehydration.   -Medical oncology/Dr. Truett Perna has been following.  Ascites, malignant Abdominal pain Nausea/vomiting -Secondary to malignancy - Ultrasound of abdomen done 10/24/2023 showed moderate diffuse ascites. - Has had previous paracentesis prior to this hospitalization.  Status post paracentesis done 10/20/2023 with 4.6 L removed.  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 - Recommend Surg eval for feeding tube.    History of DVT - On IV heparin drip, continue per protocol - Has been on Eliquis at home, may transition when appropriate - Monitor closely for bleeding  Anemia - Hemoglobin 10.4 today - Likely due to nutritional deficiencies and malignancy - Continue to monitor CBC with differential  Thrombocytopenia - Mild - Platelets 147K today - Continue to monitor CBC with differential   Code Status Full  Subjective:  Patient seen awake and alert laying in bed.  Reports ongoing abdominal pain.  Reports he had first bowel movement today in 4 days.  Complains of ongoing nausea and vomiting x 1 overnight.  Also reports paracentesis done yesterday and that he tolerated procedure well.  Reports he cannot eat and appetite is very poor.  IV heparin is infusing well.  No other complaints offered.     Objective:  Vitals:   10/24/23 2001  10/25/23 0353  BP: 97/73 96/67  Pulse: 86 87  Resp: 18 18  Temp: 98.1 F (36.7 C) 98 F (36.7 C)  SpO2: 99% 100%     Intake/Output Summary (Last 24 hours) at 10/25/2023 9811 Last data filed at 10/25/2023 0600 Gross per 24 hour  Intake 2106.7 ml  Output 201 ml  Net 1905.7 ml     REVIEW OF SYSTEMS:   Constitutional: Denies fevers, chills or abnormal night sweats Eyes: Denies blurriness of vision, double vision or watery eyes Ears, nose, mouth, throat, and face: Denies mucositis or sore throat Respiratory: Denies cough, dyspnea or wheezes Cardiovascular: Denies palpitation, chest discomfort or lower extremity swelling Gastrointestinal: +nausea, + constipation + abdominal pain Skin: Denies abnormal skin rashes Lymphatics: Denies new lymphadenopathy or easy bruising Neurological: Denies numbness, tingling or new weaknesses Behavioral/Psych: Mood is stable, no new changes  All other systems were reviewed with the patient and are negative.  PHYSICAL EXAMINATION: ECOG PERFORMANCE STATUS: 2 - Symptomatic, <50% confined to bed  Vitals:   10/24/23 2001 10/25/23 0353  BP: 97/73 96/67  Pulse: 86 87  Resp: 18 18  Temp: 98.1 F (36.7 C) 98 F (36.7 C)  SpO2: 99% 100%   Filed Weights   10/23/23 1939  Weight: 127 lb (57.6 kg)    GENERAL: alert, no distress and comfortable SKIN: skin color, texture, turgor are normal, no rashes or significant lesions EYES: normal, conjunctiva are pink and non-injected, sclera clear OROPHARYNX: no exudate, no erythema and lips, buccal mucosa, and tongue normal  NECK: supple, thyroid normal size, non-tender, without nodularity LYMPH: no palpable lymphadenopathy in the cervical, axillary  or inguinal LUNGS: clear to auscultation and percussion with normal breathing effort HEART: regular rate & rhythm and no murmurs and no lower extremity edema ABDOMEN: abdomen soft, non-tender and normal bowel sounds MUSCULOSKELETAL: no cyanosis of digits and no  clubbing  PSYCH: alert & oriented x 3 with fluent speech NEURO: no focal motor/sensory deficits   All questions were answered. The patient knows to call the clinic with any problems, questions or concerns.   The total time spent in the appointment was 40 minutes encounter with patient including review of chart and various tests results, discussions about plan of care and coordination of care plan  [name], NP 10/25/2023 9:49 AM    Labs Reviewed:  Lab Results  Component Value Date   WBC 9.3 10/25/2023   HGB 10.4 (L) 10/25/2023   HCT 32.1 (L) 10/25/2023   MCV 89.9 10/25/2023   PLT 147 (L) 10/25/2023   Recent Labs    10/18/23 0752 10/23/23 1603 10/24/23 0537 10/25/23 0400  NA 135 133* 130* 129*  K 3.8 3.0* 3.3* 3.1*  CL 98 92* 95* 98  CO2 23 29 25 25   GLUCOSE 107* 118* 80 75  BUN 11 14 11 8   CREATININE 1.09 0.77 0.65 0.63  CALCIUM 9.6 8.9 8.4* 8.0*  GFRNONAA >60 >60 >60 >60  PROT 6.5 5.8* 5.0*  --   ALBUMIN 3.5 2.6* 2.3*  --   AST 16 24 21   --   ALT 9 10 8   --   ALKPHOS 117 169* 117  --   BILITOT 0.5 0.8 0.8  --   BILIDIR  --   --  <0.1  --   IBILI  --   --  NOT CALCULATED  --     Studies Reviewed:  US Abdomen Limited Result Date: 10/24/2023 CLINICAL DATA:  Ascites EXAM: LIMITED ABDOMEN ULTRASOUND FOR ASCITES TECHNIQUE: Limited ultrasound survey for ascites was performed in all four abdominal quadrants. COMPARISON:  09/28/2023 FINDINGS: Moderate ascites identified particularly right hemiabdomen and left lower quadrant. IMPRESSION: Moderate diffuse ascites. Electronically Signed   By: Karen Kays M.D.   On: 10/24/2023 13:24   DG Abd 1 View Result Date: 10/23/2023 CLINICAL DATA:  Nausea, vomiting EXAM: ABDOMEN - 1 VIEW COMPARISON:  None Available. FINDINGS: The bowel gas pattern is normal. No radio-opaque calculi or other significant radiographic abnormality are seen. Moderate stool burden throughout the colon. IMPRESSION: Moderate stool burden.  No acute  findings. Electronically Signed   By: Charlett Nose M.D.   On: 10/23/2023 22:38   IR Paracentesis Result Date: 10/20/2023 INDICATION: Patient with history of gastric cancer, recurrent ascites. Request for diagnostic and therapeutic paracentesis. EXAM: ULTRASOUND GUIDED DIAGNOSTIC AND THERAPEUTIC PARACENTESIS MEDICATIONS: 7 mL 1% lidocaine. COMPLICATIONS: None immediate. PROCEDURE: Informed written consent was obtained from the patient after a discussion of the risks, benefits and alternatives to treatment. A timeout was performed prior to the initiation of the procedure. Initial ultrasound scanning demonstrates a large amount of ascites within the right lower abdominal quadrant. The right lower abdomen was prepped and draped in the usual sterile fashion. 1% lidocaine was used for local anesthesia. Following this, a 19 gauge, 7-cm, Yueh catheter was introduced. An ultrasound image was saved for documentation purposes. The paracentesis was performed. The catheter was removed and a dressing was applied. The patient tolerated the procedure well without immediate post procedural complication. FINDINGS: A total of approximately 4.6 L of clear yellow fluid was removed. Samples were sent to the laboratory as  requested by the clinical team. IMPRESSION: Successful ultrasound-guided paracentesis yielding 4.6 liters of peritoneal fluid. Performed by Lynnette Caffey, PA-C Electronically Signed   By: Irish Lack M.D.   On: 10/20/2023 13:20   CT ABDOMEN PELVIS W CONTRAST Result Date: 09/28/2023 CLINICAL DATA:  Staging gastric carcinoma metastatic carcinoma. * Tracking Code: BO * insert EXAM: CT ABDOMEN AND PELVIS WITH CONTRAST TECHNIQUE: Multidetector CT imaging of the abdomen and pelvis was performed using the standard protocol following bolus administration of intravenous contrast. RADIATION DOSE REDUCTION: This exam was performed according to the departmental dose-optimization program which includes automated  exposure control, adjustment of the mA and/or kV according to patient size and/or use of iterative reconstruction technique. CONTRAST:  OMNIPAQUE IOHEXOL 300 MG/ML  SOLN COMPARISON:  CT 09/15/2022 FINDINGS: Lower chest: Lung bases are clear. Hepatobiliary: No focal hepatic lesions. Scattered hypodensities are less conspicuous. Single hypodensity remains along the posterior aspect of the RIGHT hepatic lobe measuring less than 1 cm on image 31/series 2 Postcholecystectomy. Large volume intraperitoneal free fluid surrounds the liver increased from prior. Pancreas: Pancreas is normal. No ductal dilatation. No pancreatic inflammation. Spleen: Normal spleen Adrenals/urinary tract: Adrenal glands normal. RIGHT renal cortical scarring. Ureters and bladder normal. Stomach/Bowel: Stomach appears normal. A mild nonspecific thickening through the pyloric region. Duodenum and small-bowel normal. Small bowel flow centrally within large volume ascites. The colon and rectosigmoid colon are normal. Vascular/Lymphatic: Abdominal aorta is normal caliber. No periportal or retroperitoneal adenopathy. No pelvic adenopathy. Reproductive: Unremarkable Other: Large volume intraperitoneal free fluid. This large volume of fluid limits evaluation of the omentum and peritoneum. No clear evidence of peritoneal nodularity. Musculoskeletal: No aggressive osseous lesion. IMPRESSION: 1. Large volume intraperitoneal free fluid increased from prior. 2. No clear evidence of peritoneal carcinomatosis. Difficult to assess omentum with large volume ascites. 3. Decreased conspicuity of hepatic hypodensities. Single hypodensity remains along the posterior aspect of the RIGHT hepatic lobe. 4. Mild nonspecific thickening of the pyloric region of the stomach. 5. RIGHT renal cortical scarring. Electronically Signed   By: Genevive Bi M.D.   On: 09/28/2023 15:16   US Paracentesis Result Date: 09/28/2023 INDICATION: Patient with history of stage IV  gastric cancer, recurrent ascites. Request received for therapeutic paracentesis. EXAM: ULTRASOUND GUIDED THERAPEUTIC PARACENTESIS MEDICATIONS: 8 mL 1% lidocaine COMPLICATIONS: None immediate. PROCEDURE: Informed written consent was obtained from the patient after a discussion of the risks, benefits and alternatives to treatment. A timeout was performed prior to the initiation of the procedure. Initial ultrasound scanning demonstrates a large amount of ascites within the right mid to lower abdominal quadrant. The right mid to lower abdomen was prepped and draped in the usual sterile fashion. 1% lidocaine was used for local anesthesia. Following this, a 19 gauge, 10-cm, Yueh catheter was introduced. An ultrasound image was saved for documentation purposes. The paracentesis was performed. The catheter was removed and a dressing was applied. The patient tolerated the procedure well without immediate post procedural complication. FINDINGS: A total of approximately 5 liters of yellow fluid was removed. IMPRESSION: Successful ultrasound-guided therapeutic paracentesis yielding 5 liters of peritoneal fluid. Performed by: Artemio Aly Electronically Signed   By: Richarda Overlie M.D.   On: 09/28/2023 12:30

## 2023-10-25 NOTE — Progress Notes (Signed)
 PROGRESS NOTE    [name]  MVH:[id number] DOB: [birth year] DOA: 10/23/2023 PCP: Wanda Plump, MD  Chief Complaint  Patient presents with   Emesis    Brief Narrative:   63 year old male with gastric cancer who is undergoing chemotherapy. He has chronic issues with nausea vomiting and abdominal pain however, he feels worse over the past few days. He states that he is having episodes of sudden vomiting and has been unable to tolerate liquids and solids.   Assessment & Plan:   Principal Problem:   Intractable nausea and vomiting Active Problems:   DVT, HX OF   Malignant ascites   Gastric cancer (HCC)   Hypokalemia   Metastatic cancer (HCC)   Protein-calorie malnutrition, severe   Adenocarcinoma carcinomatosis (HCC)  Metastatic Gastric Cancer  Intractable Nausea, Vomiting, and Abdominal Pain  Malignant Ascites Appreciate GI assistance Appreciate oncology assistance CT scan pending, they're considering endoscopy based on this Lipase pending May need to consider repeat paracentessis  Poor PO intake Follow CT scan and GI eval  History DVT Currently on heparin gtt  Anemia Stable H/H, will trend  Thombocytopenia  mild    DVT prophylaxis: heparin gtt Code Status: full Family Communication: none Disposition:   Status is: Inpatient Remains inpatient appropriate because: need for ongoing GI evaluation   Consultants:  GI oncology  Procedures:  none  Antimicrobials:  Anti-infectives (From admission, onward)    None       Subjective: C/o abdominal discomfort Inability to tolerate PO  Objective: Vitals:   10/24/23 2001 10/25/23 0353 10/25/23 1155 10/25/23 1800  BP: 97/73 96/67 113/85   Pulse: 86 87 80   Resp: 18 18    Temp: 98.1 F (36.7 C) 98 F (36.7 C) 98.1 F (36.7 C)   TempSrc:  Oral Oral   SpO2: 99% 100% 100%   Weight:    57.7 kg  Height:        Intake/Output Summary (Last 24 hours) at 10/25/2023 1832 Last data filed at  10/25/2023 1610 Gross per 24 hour  Intake 201.22 ml  Output 1 ml  Net 200.22 ml   Filed Weights   10/23/23 1939 10/25/23 1800  Weight: 57.6 kg 57.7 kg    Examination:  General exam: Appears calm and comfortable  Respiratory system: unlabored Cardiovascular system: RRR Gastrointestinal system: distended, diffuse mild abdominal discomfort Central nervous system: Alert and oriented. No focal neurological deficits. Extremities: no LEE   Data Reviewed: I have personally reviewed following labs and imaging studies  CBC: Recent Labs  Lab 10/23/23 1603 10/24/23 0537 10/25/23 0400  WBC 35.9* 22.2* 9.3  NEUTROABS 34.5*  --   --   HGB 13.3 10.9* 10.4*  HCT 38.9* 33.9* 32.1*  MCV 87.8 90.2 89.9  PLT 253 188 147*    Basic Metabolic Panel: Recent Labs  Lab 10/23/23 1603 10/24/23 0537 10/25/23 0400  NA 133* 130* 129*  K 3.0* 3.3* 3.1*  CL 92* 95* 98  CO2 29 25 25   GLUCOSE 118* 80 75  BUN 14 11 8   CREATININE 0.77 0.65 0.63  CALCIUM 8.9 8.4* 8.0*  MG 2.1  --  2.3  PHOS 3.3  --   --     GFR: Estimated Creatinine Clearance: 80.1 mL/min (by C-G formula based on SCr of 0.63 mg/dL).  Liver Function Tests: Recent Labs  Lab 10/23/23 1603 10/24/23 0537  AST 24 21  ALT 10 8  ALKPHOS 169* 117  BILITOT 0.8 0.8  PROT 5.8* 5.0*  ALBUMIN 2.6* 2.3*    CBG: No results for input(s): "GLUCAP" in the last 168 hours.   Recent Results (from the past 240 hours)  Resp panel by RT-PCR (RSV, Flu Latayna Ritchie&B, Covid) Anterior Nasal Swab     Status: None   Collection Time: 10/23/23  3:50 PM   Specimen: Anterior Nasal Swab  Result Value Ref Range Status   SARS Coronavirus 2 by RT PCR NEGATIVE NEGATIVE Final    Comment: (NOTE) SARS-CoV-2 target nucleic acids are NOT DETECTED.  The SARS-CoV-2 RNA is generally detectable in upper respiratory specimens during the acute phase of infection. The lowest concentration of SARS-CoV-2 viral copies this assay can detect is 138 copies/mL. Jaleal Schliep  negative result does not preclude SARS-Cov-2 infection and should not be used as the sole basis for treatment or other patient management decisions. Shariah Assad negative result may occur with  improper specimen collection/handling, submission of specimen other than nasopharyngeal swab, presence of viral mutation(s) within the areas targeted by this assay, and inadequate number of viral copies(<138 copies/mL). Emunah Texidor negative result must be combined with clinical observations, patient history, and epidemiological information. The expected result is Negative.  Fact Sheet for Patients:  BloggerCourse.com  Fact Sheet for Healthcare Providers:  SeriousBroker.it  This test is no t yet approved or cleared by the [place] FDA and  has been authorized for detection and/or diagnosis of SARS-CoV-2 by FDA under an Emergency Use Authorization (EUA). This EUA will remain  in effect (meaning this test can be used) for the duration of the COVID-19 declaration under Section 564(b)(1) of the Act, 21 U.S.C.section 360bbb-3(b)(1), unless the authorization is terminated  or revoked sooner.       Influenza Aurelius Gildersleeve by PCR NEGATIVE NEGATIVE Final   Influenza B by PCR NEGATIVE NEGATIVE Final    Comment: (NOTE) The Xpert Xpress SARS-CoV-2/FLU/RSV plus assay is intended as an aid in the diagnosis of influenza from Nasopharyngeal swab specimens and should not be used as Annalysa Mohammad sole basis for treatment. Nasal washings and aspirates are unacceptable for Xpert Xpress SARS-CoV-2/FLU/RSV testing.  Fact Sheet for Patients: BloggerCourse.com  Fact Sheet for Healthcare Providers: SeriousBroker.it  This test is not yet approved or cleared by the [place] FDA and has been authorized for detection and/or diagnosis of SARS-CoV-2 by FDA under an Emergency Use Authorization (EUA). This EUA will remain in effect (meaning this test can  be used) for the duration of the COVID-19 declaration under Section 564(b)(1) of the Act, 21 U.S.C. section 360bbb-3(b)(1), unless the authorization is terminated or revoked.     Resp Syncytial Virus by PCR NEGATIVE NEGATIVE Final    Comment: (NOTE) Fact Sheet for Patients: BloggerCourse.com  Fact Sheet for Healthcare Providers: SeriousBroker.it  This test is not yet approved or cleared by the [place] FDA and has been authorized for detection and/or diagnosis of SARS-CoV-2 by FDA under an Emergency Use Authorization (EUA). This EUA will remain in effect (meaning this test can be used) for the duration of the COVID-19 declaration under Section 564(b)(1) of the Act, 21 U.S.C. section 360bbb-3(b)(1), unless the authorization is terminated or revoked.  Performed at [facility], [address], [place], [place] [address]          Radiology Studies: US Abdomen Limited Result Date: 10/24/2023 CLINICAL DATA:  Ascites EXAM: LIMITED ABDOMEN ULTRASOUND FOR ASCITES TECHNIQUE: Limited ultrasound survey for ascites was performed in all four abdominal quadrants. COMPARISON:  09/28/2023 FINDINGS: Moderate ascites identified particularly right hemiabdomen and left lower quadrant.  IMPRESSION: Moderate diffuse ascites. Electronically Signed   By: Karen Kays M.D.   On: 10/24/2023 13:24   DG Abd 1 View Result Date: 10/23/2023 CLINICAL DATA:  Nausea, vomiting EXAM: ABDOMEN - 1 VIEW COMPARISON:  None Available. FINDINGS: The bowel gas pattern is normal. No radio-opaque calculi or other significant radiographic abnormality are seen. Moderate stool burden throughout the colon. IMPRESSION: Moderate stool burden.  No acute findings. Electronically Signed   By: Charlett Nose M.D.   On: 10/23/2023 22:38        Scheduled Meds:  Chlorhexidine Gluconate Cloth  6 each Topical Daily   feeding supplement  1 Container Oral TID BM    metoCLOPramide (REGLAN) injection  10 mg Intravenous Q6H   pantoprazole (PROTONIX) IV  40 mg Intravenous QHS   scopolamine  1 patch Transdermal Q72H   sodium chloride flush  3 mL Intravenous Q12H   SMOG  400 mL Rectal Once   Continuous Infusions:  heparin 850 Units/hr (10/25/23 1610)     LOS: 0 days    Time spent: over 30 min    Lacretia Nicks, MD [facility]   To contact the attending provider between 7A-7P or the covering provider during after hours 7P-7A, please log into the web site [email or web address] and access using universal [place] password for that web site. If you do not have the password, please call the hospital operator.  10/25/2023, 6:32 PM

## 2023-10-25 NOTE — Progress Notes (Signed)
 PHARMACY - ANTICOAGULATION CONSULT NOTE  Pharmacy Consult for heparin Indication:  h/o renal infarct and h/o DVT  (PTA Eliquis on hold)  Allergies  Allergen Reactions   Pravastatin Itching   Simvastatin Itching    Patient Measurements: Height: 6\' 1"  (185.4 cm) Weight: 57.6 kg (127 lb) IBW/kg (Calculated) : 79.9 Heparin Dosing Weight: 57.6 kg  Vital Signs: Temp: 98 F (36.7 C) (03/19 0353) Temp Source: Oral (03/19 0353) BP: 96/67 (03/19 0353) Pulse Rate: 87 (03/19 0353)  Labs: Recent Labs    10/23/23 1603 10/23/23 1851 10/24/23 0537 10/25/23 0400  HGB 13.3  --  10.9* 10.4*  HCT 38.9*  --  33.9* 32.1*  PLT 253  --  188 147*  APTT  --  32 97* 142*  HEPARINUNFRC  --  >1.10* >1.10*  --   CREATININE 0.77  --  0.65 0.63    Estimated Creatinine Clearance: 80 mL/min (by C-G formula based on SCr of 0.63 mg/dL).   Medical History: Past Medical History:  Diagnosis Date   Chest pain    stres test neg x 2, cath 5/12; minimal LAD irregs; no obs CAD; normal LVF   Clotting disorder (HCC)    dvt   Deep vein thrombosis (HCC) 10/06/2010   GERD (gastroesophageal reflux disease)    History of DVT of lower extremity    on chronic coumadin   Hypertension    Internal hemorrhoids    Cscope 2007   PAD (peripheral artery disease) (HCC)    a. left leg ischemia tx wtih embolectomy of left fem, pop, tib arteries with Dr. Edilia Bo - 08/2006;  b. known occlusion of right pop with collats - med Rx;  c.ABI's 8/09: R 1.0; L 0.91    PFO (patent foramen ovale)    per 08/2006 discharge summary, TEE showed EF 60%, small PFO with minimal right-to-left shunt with Valsalva; not felt to be the source of emboli, lifelong Coumadin recommended   Pneumonia    history of   Polysubstance abuse (HCC)    marijuana only   Renal infarct [facility])    R    Assessment: Patient is a 63 y.o. male with hx HTN, PAD, renal infarct, stage IV gastric cancer on chemo, and LE DVT in 2008 on Eliquis PTA who presented to  the ED on 10/23/23 with c/o severe nausea and vomiting. Pt received last dose (cycle 5) of FOLFOX/nivolumab on 10/18/23. He reported nausea with recurrent bouts of vomiting since 10/20/23, which couldn't be improved by Protonix, Carafate, and [name] at home. Anticoagulation changed to heparin due to poor oral intake secondary to nausea and vomiting and in case invasive intervention is needed.  - Last dose Eliquis was taken on 3/17 at 10am.   Today, 10/25/23: - Heparin level collected at 12:30 pm elevated at 0.79 - likely due to residual effect of Eliquis - aPTT collected at 12:30 pm therapeutic at 99 secs - will adjust heparin using aPTT at this time. Once heparin level & aPTT correlate, then will monitor using heparin levels only. - Hgb 10.4, plt low at 147 - Scr 0.63 - No bleeding noted per RN  Goal of Therapy:  Heparin level 0.3-0.7 units/ml aPTT 66-102 seconds Monitor platelets by anticoagulation protocol: Yes   Plan:  - Continue heparin drip at 850 units/hr - Check 6 hr aPTT - Daily CBC - Monitor for s/sx bleeding   Tory Emerald, PharmD Candidate 10/25/2023,8:12 AM

## 2023-10-25 NOTE — Progress Notes (Signed)
 PHARMACY - ANTICOAGULATION CONSULT NOTE  Pharmacy Consult for Heparin Indication:  h/o VTE  Allergies  Allergen Reactions   Pravastatin Itching   Simvastatin Itching    Patient Measurements: Height: 6\' 1"  (185.4 cm) Weight: 57.6 kg (127 lb) IBW/kg (Calculated) : 79.9 Heparin Dosing Weight: 62.4 kg  Vital Signs: Temp: 98 F (36.7 C) (03/19 0353) Temp Source: Oral (03/19 0353) BP: 96/67 (03/19 0353) Pulse Rate: 87 (03/19 0353)  Labs: Recent Labs    10/23/23 1603 10/23/23 1851 10/24/23 0537 10/25/23 0400  HGB 13.3  --  10.9* 10.4*  HCT 38.9*  --  33.9* 32.1*  PLT 253  --  188 147*  APTT  --  32 97* 142*  HEPARINUNFRC  --  >1.10* >1.10*  --   CREATININE 0.77  --  0.65 0.63    Estimated Creatinine Clearance: 80 mL/min (by C-G formula based on SCr of 0.63 mg/dL).   Medical History: Past Medical History:  Diagnosis Date   Chest pain    stres test neg x 2, cath 5/12; minimal LAD irregs; no obs CAD; normal LVF   Clotting disorder (HCC)    dvt   Deep vein thrombosis (HCC) 10/06/2010   GERD (gastroesophageal reflux disease)    History of DVT of lower extremity    on chronic coumadin   Hypertension    Internal hemorrhoids    Cscope 2007   PAD (peripheral artery disease) (HCC)    a. left leg ischemia tx wtih embolectomy of left fem, pop, tib arteries with Dr. Edilia Bo - 08/2006;  b. known occlusion of right pop with collats - med Rx;  c.ABI's 8/09: R 1.0; L 0.91    PFO (patent foramen ovale)    per 08/2006 discharge summary, TEE showed EF 60%, small PFO with minimal right-to-left shunt with Valsalva; not felt to be the source of emboli, lifelong Coumadin recommended   Pneumonia    history of   Polysubstance abuse (HCC)    marijuana only   Renal infarct (HCC)    R    Assessment: Active Problem(s): Emesis Has been taking Protonix, Carafate and was recently started on [name] for appetite stimulation but continues to vomit.   AC/Heme: Eliquis PTA (LD 3/17 1000)  for h/o VTE, Hgb 13.3, Plts 253   Today, 10/25/23 aPTT is 142 supra-therapeutic on 1000 units/hr Hgb 10.4, plts down 147 Per RN no bleeding noted  Goal of Therapy:  aPTT 66-102 seconds Monitor platelets by anticoagulation protocol: Yes   Plan:  Hold heparin x 1 hour then resume at 850 units/hr aPTT in 6 hours Daily aPTT, CBC, HL. Monitor for signs and symptoms of bleeding    Arley Phenix RPh 10/25/2023, 4:56 AM

## 2023-10-26 ENCOUNTER — Inpatient Hospital Stay (HOSPITAL_COMMUNITY)

## 2023-10-26 DIAGNOSIS — C800 Disseminated malignant neoplasm, unspecified: Secondary | ICD-10-CM | POA: Diagnosis not present

## 2023-10-26 DIAGNOSIS — K5900 Constipation, unspecified: Secondary | ICD-10-CM | POA: Diagnosis not present

## 2023-10-26 DIAGNOSIS — R112 Nausea with vomiting, unspecified: Secondary | ICD-10-CM | POA: Diagnosis not present

## 2023-10-26 DIAGNOSIS — C169 Malignant neoplasm of stomach, unspecified: Secondary | ICD-10-CM | POA: Diagnosis not present

## 2023-10-26 LAB — CBC WITH DIFFERENTIAL/PLATELET
Abs Immature Granulocytes: 0.01 10*3/uL (ref 0.00–0.07)
Basophils Absolute: 0 10*3/uL (ref 0.0–0.1)
Basophils Relative: 1 %
Eosinophils Absolute: 0 10*3/uL (ref 0.0–0.5)
Eosinophils Relative: 2 %
HCT: 32.8 % — ABNORMAL LOW (ref 39.0–52.0)
Hemoglobin: 10.5 g/dL — ABNORMAL LOW (ref 13.0–17.0)
Immature Granulocytes: 1 %
Lymphocytes Relative: 57 %
Lymphs Abs: 1.1 10*3/uL (ref 0.7–4.0)
MCH: 28.5 pg (ref 26.0–34.0)
MCHC: 32 g/dL (ref 30.0–36.0)
MCV: 89.1 fL (ref 80.0–100.0)
Monocytes Absolute: 0.1 10*3/uL (ref 0.1–1.0)
Monocytes Relative: 7 %
Neutro Abs: 0.6 10*3/uL — ABNORMAL LOW (ref 1.7–7.7)
Neutrophils Relative %: 32 %
Platelets: 125 10*3/uL — ABNORMAL LOW (ref 150–400)
RBC: 3.68 MIL/uL — ABNORMAL LOW (ref 4.22–5.81)
RDW: 15.2 % (ref 11.5–15.5)
WBC: 1.9 10*3/uL — ABNORMAL LOW (ref 4.0–10.5)
nRBC: 0 % (ref 0.0–0.2)

## 2023-10-26 LAB — CBC
HCT: 30.7 % — ABNORMAL LOW (ref 39.0–52.0)
Hemoglobin: 9.7 g/dL — ABNORMAL LOW (ref 13.0–17.0)
MCH: 28.3 pg (ref 26.0–34.0)
MCHC: 31.6 g/dL (ref 30.0–36.0)
MCV: 89.5 fL (ref 80.0–100.0)
Platelets: 127 10*3/uL — ABNORMAL LOW (ref 150–400)
RBC: 3.43 MIL/uL — ABNORMAL LOW (ref 4.22–5.81)
RDW: 15.4 % (ref 11.5–15.5)
WBC: 3.5 10*3/uL — ABNORMAL LOW (ref 4.0–10.5)
nRBC: 0 % (ref 0.0–0.2)

## 2023-10-26 LAB — BODY FLUID CELL COUNT WITH DIFFERENTIAL
Eos, Fluid: 1 %
Lymphs, Fluid: 56 %
Monocyte-Macrophage-Serous Fluid: 40 % — ABNORMAL LOW (ref 50–90)
Neutrophil Count, Fluid: 3 % (ref 0–25)
Total Nucleated Cell Count, Fluid: 473 uL (ref 0–1000)

## 2023-10-26 LAB — BASIC METABOLIC PANEL
Anion gap: 9 (ref 5–15)
BUN: 8 mg/dL (ref 6–20)
CO2: 22 mmol/L (ref 22–32)
Calcium: 8 mg/dL — ABNORMAL LOW (ref 8.9–10.3)
Chloride: 100 mmol/L (ref 98–111)
Creatinine, Ser: 0.55 mg/dL — ABNORMAL LOW (ref 0.61–1.24)
GFR, Estimated: >60 mL/min (ref >60)
Glucose, Bld: 72 mg/dL (ref 70–99)
Potassium: 4 mmol/L (ref 3.5–5.1)
Sodium: 131 mmol/L — ABNORMAL LOW (ref 135–145)

## 2023-10-26 LAB — HEPARIN LEVEL (UNFRACTIONATED)
Heparin Unfractionated: 0.42 [IU]/mL (ref 0.30–0.70)
Heparin Unfractionated: 0.35 [IU]/mL (ref 0.30–0.70)

## 2023-10-26 LAB — LIPASE, BLOOD: Lipase: 33 U/L (ref 11–51)

## 2023-10-26 LAB — APTT: aPTT: 97 s — ABNORMAL HIGH (ref 24–36)

## 2023-10-26 MED ORDER — BISACODYL 10 MG RE SUPP: 10.0000 mg | Freq: Every day | RECTAL | Status: DC | PRN

## 2023-10-26 MED ORDER — HYDROMORPHONE HCL 2 MG PO TABS
4.0000 mg | ORAL_TABLET | ORAL | Status: DC | PRN
  Administered 2023-10-26 – 2023-11-01 (×6): 4 mg via ORAL
  Filled 2023-10-26 (×6): qty 2

## 2023-10-26 MED ORDER — ENSURE ENLIVE PO LIQD
237.0000 mL | Freq: Two times a day (BID) | ORAL | Status: DC
  Administered 2023-10-27 – 2023-11-01 (×8): 237 mL via ORAL

## 2023-10-26 MED ORDER — NICOTINE 21 MG/24HR TD PT24
21.0000 mg | MEDICATED_PATCH | TRANSDERMAL | Status: DC
  Administered 2023-10-26 – 2023-10-31 (×6): 21 mg via TRANSDERMAL
  Filled 2023-10-26 (×6): qty 1

## 2023-10-26 MED ORDER — LIDOCAINE HCL 1 % IJ SOLN
INTRAMUSCULAR | Status: AC
  Filled 2023-10-26: qty 20

## 2023-10-26 NOTE — Progress Notes (Signed)
 PHARMACY - ANTICOAGULATION CONSULT NOTE  Pharmacy Consult for heparin Indication:  h/o renal infarct and h/o DVT (PTA Eliquis on hold)  Allergies  Allergen Reactions   Pravastatin Itching   Simvastatin Itching    Patient Measurements: Height: 6\' 1"  (185.4 cm) Weight: 57.7 kg (127 lb 1.6 oz) IBW/kg (Calculated) : 79.9 Heparin Dosing Weight: 57.6 kg  Vital Signs: Temp: 97.4 F (36.3 C) (03/20 0555) BP: 122/80 (03/20 0555) Pulse Rate: 85 (03/20 0555)  Labs: Recent Labs    10/24/23 0537 10/25/23 0400 10/25/23 1230 10/25/23 1825 10/26/23 0202 10/26/23 1000  HGB 10.9* 10.4*  --   --  9.7*  --   HCT 33.9* 32.1*  --   --  30.7*  --   PLT 188 147*  --   --  127*  --   APTT 97* 142* 99* 118* 97*  --   HEPARINUNFRC >1.10*  --  0.79*  --  0.42 0.35  CREATININE 0.65 0.63  --   --  0.55*  --     Estimated Creatinine Clearance: 80.1 mL/min (A) (by C-G formula based on SCr of 0.55 mg/dL (L)).   Medical History: Past Medical History:  Diagnosis Date   Chest pain    stres test neg x 2, cath 5/12; minimal LAD irregs; no obs CAD; normal LVF   Clotting disorder (HCC)    dvt   Deep vein thrombosis (HCC) 10/06/2010   GERD (gastroesophageal reflux disease)    History of DVT of lower extremity    on chronic coumadin   Hypertension    Internal hemorrhoids    Cscope 2007   PAD (peripheral artery disease) (HCC)    a. left leg ischemia tx wtih embolectomy of left fem, pop, tib arteries with Dr. Edilia Bo - 08/2006;  b. known occlusion of right pop with collats - med Rx;  c.ABI's 8/09: R 1.0; L 0.91    PFO (patent foramen ovale)    per 08/2006 discharge summary, TEE showed EF 60%, small PFO with minimal right-to-left shunt with Valsalva; not felt to be the source of emboli, lifelong Coumadin recommended   Pneumonia    history of   Polysubstance abuse (HCC)    marijuana only   Renal infarct [facility])    R    Assessment: Patient is a 63 y.o. male with hx HTN, PAD, renal infarct, stage  IV gastric cancer on chemo, and LE DVT in 2008 on Eliquis PTA who presented to the ED on 10/23/23 with c/o severe nausea and vomiting. Pt received last dose (cycle 5) of FOLFOX/nivolumab on 10/18/23. He reported nausea with recurrent bouts of vomiting since 10/20/23, which couldn't be improved by Protonix, Carafate, and [name] at home. Anticoagulation changed to heparin due to poor oral intake secondary to nausea and vomiting and in case invasive intervention is needed.  - Last dose Eliquis was taken on 3/17 at 10am.   Today, 10/26/23: - Heparin level collected at 10 am therapeutic again at 0.35 while on heparin drip 800 units/hr - Hgb down to 9.7, plts down to 127 - No bleeding noted per RN  Goal of Therapy:  Heparin level 0.3-0.7 units/ml aPTT 66-102 seconds Monitor platelets by anticoagulation protocol: Yes   Plan:  - Continue heparin drip at 800 units/hr - Daily CBC & heparin level - Monitor for s/sx bleeding   Thank you for allowing pharmacy to be a part of this patient's care.  Tory Emerald, PharmD Candidate 10/26/2023 11:08 AM

## 2023-10-26 NOTE — Progress Notes (Signed)
 PHARMACY - ANTICOAGULATION CONSULT NOTE  Pharmacy Consult for heparin Indication:  h/o renal infarct and h/o DVT  (PTA Eliquis on hold)  Allergies  Allergen Reactions   Pravastatin Itching   Simvastatin Itching    Patient Measurements: Height: 6\' 1"  (185.4 cm) Weight: 57.7 kg (127 lb 1.6 oz) IBW/kg (Calculated) : 79.9 Heparin Dosing Weight: 57.6 kg  Vital Signs: Temp: 98.2 F (36.8 C) (03/19 2119) BP: 115/81 (03/19 2119) Pulse Rate: 83 (03/19 2119)  Labs: Recent Labs    10/23/23 1851 10/24/23 0537 10/25/23 0400 10/25/23 1230 10/25/23 1825 10/26/23 0202  HGB  --  10.9* 10.4*  --   --  9.7*  HCT  --  33.9* 32.1*  --   --  30.7*  PLT  --  188 147*  --   --  127*  APTT 32 97* 142* 99* 118* 97*  HEPARINUNFRC >1.10* >1.10*  --  0.79*  --   --   CREATININE  --  0.65 0.63  --   --  0.55*    Estimated Creatinine Clearance: 80.1 mL/min (A) (by C-G formula based on SCr of 0.55 mg/dL (L)).   Medical History: Past Medical History:  Diagnosis Date   Chest pain    stres test neg x 2, cath 5/12; minimal LAD irregs; no obs CAD; normal LVF   Clotting disorder (HCC)    dvt   Deep vein thrombosis (HCC) 10/06/2010   GERD (gastroesophageal reflux disease)    History of DVT of lower extremity    on chronic coumadin   Hypertension    Internal hemorrhoids    Cscope 2007   PAD (peripheral artery disease) (HCC)    a. left leg ischemia tx wtih embolectomy of left fem, pop, tib arteries with Dr. Edilia Bo - 08/2006;  b. known occlusion of right pop with collats - med Rx;  c.ABI's 8/09: R 1.0; L 0.91    PFO (patent foramen ovale)    per 08/2006 discharge summary, TEE showed EF 60%, small PFO with minimal right-to-left shunt with Valsalva; not felt to be the source of emboli, lifelong Coumadin recommended   Pneumonia    history of   Polysubstance abuse (HCC)    marijuana only   Renal infarct [facility])    R    Assessment: Patient is a 63 y.o. male with hx HTN, PAD, renal infarct, stage  IV gastric cancer on chemo, and LE DVT in 2008 on Eliquis PTA who presented to the ED on 10/23/23 with c/o severe nausea and vomiting. Pt received last dose (cycle 5) of FOLFOX/nivolumab on 10/18/23. He reported nausea with recurrent bouts of vomiting since 10/20/23, which couldn't be improved by Protonix, Carafate, and [name] at home. Anticoagulation changed to heparin due to poor oral intake secondary to nausea and vomiting and in case invasive intervention is needed.  - Last dose Eliquis was taken on 3/17 at 10am.   Today, 10/25/23: - aPTT 97 therapeutic on 800 units/hr - HL 0.42 also therapeutic - Hgb down to 9.7, plts down to 127 - No complications of therapy noted  Goal of Therapy:  Heparin level 0.3-0.7 units/ml aPTT 66-102 seconds Monitor platelets by anticoagulation protocol: Yes   Plan:  - continue heparin drip at 800 units/hr - aPTT and heparin are correlating, will use heparin level only going forward - confirmatory heparin level in 6 hours - Daily CBC& heparin level - Monitor for s/sx bleeding   Arley Phenix RPh 10/26/2023, 3:28 AM

## 2023-10-26 NOTE — Plan of Care (Signed)

## 2023-10-26 NOTE — Progress Notes (Addendum)
 Progress Note   Subjective  Hospital day #4 Chief Complaint: Nausea and vomiting in the setting of gastric cancer  CT abdomen pelvis 3/19 shows focally dilated loop of jejunum in the left upper quadrant without obvious evidence of obstruction which may represent ileus, focal thickening of the walls of the mid transverse colon which may be reactive versus other infectious or inflammatory process, bowel wall thickening involving the gastric antrum and pylorus, similar in appearance to the prior exam, moderate to large ascites with peritoneal enhancement and omental caking suspicious for peritoneal carcinomatosis and aortic atherosclerosis  Today, patient feels about the same.  He is eager to find a way to get some nutrition.  Would rather not have a "tube through his nose" if he does not have to.  Not had another bowel movement yet.  No other change in symptoms.   Objective   Vital signs in last 24 hours: Temp:  [97.4 F (36.3 C)-98.2 F (36.8 C)] 97.4 F (36.3 C) (03/20 0555) Pulse Rate:  [80-85] 85 (03/20 0555) Resp:  [18] 18 (03/20 0555) BP: (113-122)/(80-85) 122/80 (03/20 0555) SpO2:  [99 %-100 %] 99 % (03/20 0555) Weight:  [57.7 kg] 57.7 kg (03/19 1800) Last BM Date : 10/25/23 General: AA male in NAD Heart:  Regular rate and rhythm; no murmurs Lungs: Respirations even and unlabored, lungs CTA bilaterally Abdomen:  Soft, mild to moderate generalized TTP and nondistended. Normal bowel sounds. Psych:  Cooperative. Normal mood and affect.  Intake/Output from previous day: 03/19 0701 - 03/20 0700 In: 85.2 [I.V.:85.2] Out: -  Intake/Output this shift: Total I/O In: 120 [P.O.:120] Out: 350 [Urine:350]  Lab Results: Recent Labs    10/24/23 0537 10/25/23 0400 10/26/23 0202  WBC 22.2* 9.3 3.5*  HGB 10.9* 10.4* 9.7*  HCT 33.9* 32.1* 30.7*  PLT 188 147* 127*   BMET Recent Labs    10/24/23 0537 10/25/23 0400 10/26/23 0202  NA 130* 129* 131*  K 3.3* 3.1* 4.0  CL 95*  98 100  CO2 25 25 22   GLUCOSE 80 75 72  BUN 11 8 8   CREATININE 0.65 0.63 0.55*  CALCIUM 8.4* 8.0* 8.0*      Latest Ref Rng & Units 10/24/2023    5:37 AM 10/23/2023    4:03 PM 10/18/2023    7:52 AM  Hepatic Function  Total Protein 6.5 - 8.1 g/dL 5.0  5.8  6.5   Albumin 3.5 - 5.0 g/dL 2.3  2.6  3.5   AST 15 - 41 U/L 21  24  16    ALT 0 - 44 U/L 8  10  9    Alk Phosphatase 38 - 126 U/L 117  169  117   Total Bilirubin 0.0 - 1.2 mg/dL 0.8  0.8  0.5   Bilirubin, Direct 0.0 - 0.2 mg/dL <8.4       Studies/Results: CT ABDOMEN PELVIS W CONTRAST Result Date: 10/25/2023 CLINICAL DATA:  Epigastric pain. History of gastric cancer. Assess treatment response. Concern for possible carcinomatosis. EXAM: CT ABDOMEN AND PELVIS WITH CONTRAST TECHNIQUE: Multidetector CT imaging of the abdomen and pelvis was performed using the standard protocol following bolus administration of intravenous contrast. RADIATION DOSE REDUCTION: This exam was performed according to the departmental dose-optimization program which includes automated exposure control, adjustment of the mA and/or kV according to patient size and/or use of iterative reconstruction technique. CONTRAST:  OMNIPAQUE IOHEXOL 300 MG/ML  SOLN COMPARISON:  09/28/2023, 03/28/2023. FINDINGS: Lower chest: The heart is normal in size  and there is a small pericardial effusion. Emphysematous changes are present in the lungs. Mild atelectasis or scarring is noted at the lung bases. Hepatobiliary: Scattered subcentimeter hypodensities are present in the liver common decreased in size from 03/28/2023 and not well seen on 09/28/2023. The gallbladder is surgically absent. No biliary ductal dilatation is seen. Pancreas: Unremarkable. No pancreatic ductal dilatation or surrounding inflammatory changes. Spleen: Normal in size without focal abnormality. Adrenals/Urinary Tract: The adrenal glands are within normal limits. The kidneys enhance symmetrically. Subcentimeter  hypodensities are present in the kidneys bilaterally which are too small to further characterize. Renal cortical scarring is present on the right. No renal calculus or hydronephrosis bilaterally. No obvious bladder abnormality is seen, however examination is limited due to hardware artifact. Stomach/Bowel: The stomach is distended with fluid. Gastric wall thickening is noted about the antrum and proximal duodenum. A focally dilated loop of jejunum is noted in the mid left abdomen measuring up to 3.6 cm, with no obvious obstruction. Contrast is identified in the colon. There is thickening of the walls of the mid transverse colon. The appendix is not seen. Vascular/Lymphatic: Aortic atherosclerosis. No enlarged abdominal or pelvic lymph nodes. Reproductive: Prostate is unremarkable. Other: There is moderate-to-large ascites with peritoneal enhancement and omental caking in the upper abdomen, concerning for peritoneal carcinomatosis. Musculoskeletal: Total hip arthroplasty changes are present on the left. Degenerative changes are present in the thoracolumbar spine. A pars defect is noted at L5 on the left. No acute osseous abnormality is seen. IMPRESSION: 1. Focally dilated loop of jejunum in the left upper quadrant without obvious evidence of obstruction. Findings may represent possible ileus. 2. A focal thickening of the walls of the mid transverse colon, which may be reactive versus other infectious or inflammatory process. 3. Bowel wall thickening involving the gastric antrum and pylorus, similar in appearance to the prior exam. 4. Moderate-to-large ascites with peritoneal enhancement and omental caking, suspicious for peritoneal carcinomatosis. 5. Aortic atherosclerosis. 6. Remaining incidental findings as described above. Electronically Signed   By: Thornell Sartorius M.D.   On: 10/25/2023 20:53   US Abdomen Limited Result Date: 10/24/2023 CLINICAL DATA:  Ascites EXAM: LIMITED ABDOMEN ULTRASOUND FOR ASCITES  TECHNIQUE: Limited ultrasound survey for ascites was performed in all four abdominal quadrants. COMPARISON:  09/28/2023 FINDINGS: Moderate ascites identified particularly right hemiabdomen and left lower quadrant. IMPRESSION: Moderate diffuse ascites. Electronically Signed   By: Karen Kays M.D.   On: 10/24/2023 13:24     Assessment / Plan:   Assessment: 1.  Intractable nausea and vomiting: Difficulty tolerating anything by mouth for the past month here and there, worse over the past 6 days despite taking antiemetics, chronic abdominal pain which only worsens when he has had a bad day of vomiting, repeat CTAP yesterday with carcinomatosis which is likely the etiology with/minus chemotherapy 2.  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  History of DVT: Currently on IV heparin, had been on Eliquis at home 4.  Hyponatremia/hypokalemia  Plan: 1.  Discussed results from CT with the patient.  Discussed that this carcinomatosis is likely causing a lot of the issues he is experiencing now including constipation and nausea and vomiting 2.  Will discuss case further with Dr. Leone Payor and let Dr. Truett Perna weigh in 3.  Patient needs to continue on a bowel regimen, added a daily Dulcolax suppository as needed given nausea and vomiting, is also on Sorbitol per med list  Thank you for your kind consultation, we will continue to follow.   LOS: 1 day   Unk Lightning  10/26/2023, 11:23 AM  Gastroenterology attending physician:  I have seen and evaluated the patient as well.  Paracentesis of 1.2 L today.  He tolerated clears yesterday and today.  Will advance diet to full liquids if tolerated go to  soft diet.  I think based upon the CT results with ascites and carcinomatosis that is the cause of his intractable nausea and vomiting.  I do not think there is much utility in repeat EGD.  Await Dr. Kalman Drape follow-up and input regarding treatment response, and the clinical response to paracentesis.  He may require repeat paracentesis going forward.  Iva Boop, MD, [facility] See Loretha Stapler on call - gastroenterology for best contact person 10/26/2023 5:10 PM

## 2023-10-26 NOTE — Progress Notes (Addendum)
 [name]   DOB:[birth year]   WG#:[id number]      ASSESSMENT & PLAN:  Gastric cancer, stage IV Peritoneal carcinomatosis  - Initially diagnosed December 2024 - Pathology shows metastatic poorly differentiated carcinoma consistent with primary gastric carcinoma, with signet cell features - Patient was started on FOLFOX/nivolumab cycle 1 on 08/16/2023.  Plan for every 2 weeks.  Completed 3 cycles and was admitted in February 2025 with intractable nausea/vomiting and dehydration.   - CT abd/pelvis done 3/19 suspicious for peritoneal carcinomatosis -Medical oncology/Dr. Truett Perna has been following.   Ascites, malignant Possible Ileus Abdominal pain Nausea/vomiting -Vomiting is improving patient reports no vomiting today. - Patient rates abdominal pain 7-8/10. -Secondary to malignancy - Ultrasound of abdomen done 10/24/2023 showed moderate diffuse ascites. - Has had previous paracentesis prior to this hospitalization.  Status post paracentesis done 10/20/2023 with 4.6 L removed.  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 CT abd/pelvis done 3/19 which showed possible ileus. Also moderate to large ascites with peritoneal enhancement and omental caking, suspicious for peritoneal carcinomatosis.  -Continue pain management and antiemetics as ordered - Continue to monitor   Poor appetite Weight loss - Patient reports he had been unable to eat for several days prior to discharge.  Reports he is eating a little better at this time full liquids and so far is tolerating. - Recommend Surg eval for feeding tube.     History of DVT - On IV heparin drip at this time - Has been on Eliquis at home, may transition when appropriate - Monitor closely for bleeding   Anemia - Hemoglobin 9.7 today - Likely due to nutritional deficiencies and malignancy - Continue to monitor CBC with differential   Thrombocytopenia - Mild - Platelets 127K today - Continue to monitor CBC with  differential      Code Status Full  Subjective:  Patient seen awake and alert laying in bed.  Encourage patient to get out of bed and sit in recliner.  Reports he has had no vomiting today.  Rates abdominal pain as 7-8 and has been taking pain medicine periodically.  States he is eating a little better and completed all his broth and applesauce.  No other GI symptoms or other acute complaints.  Objective:  Vitals:   10/25/23 2119 10/26/23 0555  BP: 115/81 122/80  Pulse: 83 85  Resp: 18 18  Temp: 98.2 F (36.8 C) (!) 97.4 F (36.3 C)  SpO2: 100% 99%     Intake/Output Summary (Last 24 hours) at 10/26/2023 1002 Last data filed at 10/26/2023 0900 Gross per 24 hour  Intake 205.2 ml  Output --  Net 205.2 ml     REVIEW OF SYSTEMS:   Constitutional: + Fatigue, denies fevers, chills or abnormal night sweats Eyes: Denies blurriness of vision, double vision or watery eyes Ears, nose, mouth, throat, and face: Denies mucositis or sore throat Respiratory: Denies cough, dyspnea or wheezes Cardiovascular: Denies palpitation, chest discomfort or lower extremity swelling Gastrointestinal: + nausea/vomiting, heartburn or change in bowel habits Skin: Denies abnormal skin rashes Lymphatics: Denies new lymphadenopathy or easy bruising Neurological: Denies numbness, tingling or new weaknesses Behavioral/Psych: Mood is stable, no new changes  All other systems were reviewed with the patient and are negative.  PHYSICAL EXAMINATION: ECOG PERFORMANCE STATUS: 2 - Symptomatic, <50% confined to bed  Vitals:   10/25/23 2119 10/26/23 0555  BP: 115/81 122/80  Pulse: 83 85  Resp: 18 18  Temp: 98.2 F (36.8 C) (!)  97.4 F (36.3 C)  SpO2: 100% 99%   Filed Weights   10/23/23 1939 10/25/23 1800  Weight: 127 lb (57.6 kg) 127 lb 1.6 oz (57.7 kg)    GENERAL: alert, no distress and comfortable SKIN: skin color, texture, turgor are normal, no rashes or significant lesions EYES: normal,  conjunctiva are pink and non-injected, sclera clear OROPHARYNX: no exudate, no erythema and lips, buccal mucosa, and tongue normal  NECK: supple, thyroid normal size, non-tender, without nodularity LYMPH: no palpable lymphadenopathy in the cervical, axillary or inguinal LUNGS: clear to auscultation and percussion with normal breathing effort HEART: regular rate & rhythm and no murmurs and no lower extremity edema ABDOMEN: abdomen soft, non-tender and normal bowel sounds MUSCULOSKELETAL: no cyanosis of digits and no clubbing  PSYCH: alert & oriented x 3 with fluent speech NEURO: no focal motor/sensory deficits   All questions were answered. The patient knows to call the clinic with any problems, questions or concerns.   The total time spent in the appointment was 40 minutes encounter with patient including review of chart and various tests results, discussions about plan of care and coordination of care plan  Anthony Bills, NP 10/26/2023 10:02 AM    Labs Reviewed:  Lab Results  Component Value Date   WBC 3.5 (L) 10/26/2023   HGB 9.7 (L) 10/26/2023   HCT 30.7 (L) 10/26/2023   MCV 89.5 10/26/2023   PLT 127 (L) 10/26/2023   Recent Labs    10/18/23 0752 10/23/23 1603 10/24/23 0537 10/25/23 0400 10/26/23 0202  NA 135 133* 130* 129* 131*  K 3.8 3.0* 3.3* 3.1* 4.0  CL 98 92* 95* 98 100  CO2 23 29 25 25 22   GLUCOSE 107* 118* 80 75 72  BUN 11 14 11 8 8   CREATININE 1.09 0.77 0.65 0.63 0.55*  CALCIUM 9.6 8.9 8.4* 8.0* 8.0*  GFRNONAA >60 >60 >60 >60 >60  PROT 6.5 5.8* 5.0*  --   --   ALBUMIN 3.5 2.6* 2.3*  --   --   AST 16 24 21   --   --   ALT 9 10 8   --   --   ALKPHOS 117 169* 117  --   --   BILITOT 0.5 0.8 0.8  --   --   BILIDIR  --   --  <0.1  --   --   IBILI  --   --  NOT CALCULATED  --   --     Studies Reviewed:  CT ABDOMEN PELVIS W CONTRAST Result Date: 10/25/2023 CLINICAL DATA:  Epigastric pain. History of gastric cancer. Assess treatment response. Concern for  possible carcinomatosis. EXAM: CT ABDOMEN AND PELVIS WITH CONTRAST TECHNIQUE: Multidetector CT imaging of the abdomen and pelvis was performed using the standard protocol following bolus administration of intravenous contrast. RADIATION DOSE REDUCTION: This exam was performed according to the departmental dose-optimization program which includes automated exposure control, adjustment of the mA and/or kV according to patient size and/or use of iterative reconstruction technique. CONTRAST:  OMNIPAQUE IOHEXOL 300 MG/ML  SOLN COMPARISON:  09/28/2023, 03/28/2023. FINDINGS: Lower chest: The heart is normal in size and there is a small pericardial effusion. Emphysematous changes are present in the lungs. Mild atelectasis or scarring is noted at the lung bases. Hepatobiliary: Scattered subcentimeter hypodensities are present in the liver common decreased in size from 03/28/2023 and not well seen on 09/28/2023. The gallbladder is surgically absent. No biliary ductal dilatation is seen. Pancreas: Unremarkable. No pancreatic ductal  dilatation or surrounding inflammatory changes. Spleen: Normal in size without focal abnormality. Adrenals/Urinary Tract: The adrenal glands are within normal limits. The kidneys enhance symmetrically. Subcentimeter hypodensities are present in the kidneys bilaterally which are too small to further characterize. Renal cortical scarring is present on the right. No renal calculus or hydronephrosis bilaterally. No obvious bladder abnormality is seen, however examination is limited due to hardware artifact. Stomach/Bowel: The stomach is distended with fluid. Gastric wall thickening is noted about the antrum and proximal duodenum. A focally dilated loop of jejunum is noted in the mid left abdomen measuring up to 3.6 cm, with no obvious obstruction. Contrast is identified in the colon. There is thickening of the walls of the mid transverse colon. The appendix is not seen. Vascular/Lymphatic: Aortic  atherosclerosis. No enlarged abdominal or pelvic lymph nodes. Reproductive: Prostate is unremarkable. Other: There is moderate-to-large ascites with peritoneal enhancement and omental caking in the upper abdomen, concerning for peritoneal carcinomatosis. Musculoskeletal: Total hip arthroplasty changes are present on the left. Degenerative changes are present in the thoracolumbar spine. A pars defect is noted at L5 on the left. No acute osseous abnormality is seen. IMPRESSION: 1. Focally dilated loop of jejunum in the left upper quadrant without obvious evidence of obstruction. Findings may represent possible ileus. 2. A focal thickening of the walls of the mid transverse colon, which may be reactive versus other infectious or inflammatory process. 3. Bowel wall thickening involving the gastric antrum and pylorus, similar in appearance to the prior exam. 4. Moderate-to-large ascites with peritoneal enhancement and omental caking, suspicious for peritoneal carcinomatosis. 5. Aortic atherosclerosis. 6. Remaining incidental findings as described above. Electronically Signed   By: Thornell Sartorius M.D.   On: 10/25/2023 20:53   US Abdomen Limited Result Date: 10/24/2023 CLINICAL DATA:  Ascites EXAM: LIMITED ABDOMEN ULTRASOUND FOR ASCITES TECHNIQUE: Limited ultrasound survey for ascites was performed in all four abdominal quadrants. COMPARISON:  09/28/2023 FINDINGS: Moderate ascites identified particularly right hemiabdomen and left lower quadrant. IMPRESSION: Moderate diffuse ascites. Electronically Signed   By: Karen Kays M.D.   On: 10/24/2023 13:24   DG Abd 1 View Result Date: 10/23/2023 CLINICAL DATA:  Nausea, vomiting EXAM: ABDOMEN - 1 VIEW COMPARISON:  None Available. FINDINGS: The bowel gas pattern is normal. No radio-opaque calculi or other significant radiographic abnormality are seen. Moderate stool burden throughout the colon. IMPRESSION: Moderate stool burden.  No acute findings. Electronically Signed    By: Charlett Nose M.D.   On: 10/23/2023 22:38   IR Paracentesis Result Date: 10/20/2023 INDICATION: Patient with history of gastric cancer, recurrent ascites. Request for diagnostic and therapeutic paracentesis. EXAM: ULTRASOUND GUIDED DIAGNOSTIC AND THERAPEUTIC PARACENTESIS MEDICATIONS: 7 mL 1% lidocaine. COMPLICATIONS: None immediate. PROCEDURE: Informed written consent was obtained from the patient after a discussion of the risks, benefits and alternatives to treatment. A timeout was performed prior to the initiation of the procedure. Initial ultrasound scanning demonstrates a large amount of ascites within the right lower abdominal quadrant. The right lower abdomen was prepped and draped in the usual sterile fashion. 1% lidocaine was used for local anesthesia. Following this, a 19 gauge, 7-cm, Yueh catheter was introduced. An ultrasound image was saved for documentation purposes. The paracentesis was performed. The catheter was removed and a dressing was applied. The patient tolerated the procedure well without immediate post procedural complication. FINDINGS: A total of approximately 4.6 L of clear yellow fluid was removed. Samples were sent to the laboratory as requested by the clinical team. IMPRESSION: Successful  ultrasound-guided paracentesis yielding 4.6 liters of peritoneal fluid. Performed by Lynnette Caffey, PA-C Electronically Signed   By: Irish Lack M.D.   On: 10/20/2023 13:20   CT ABDOMEN PELVIS W CONTRAST Result Date: 09/28/2023 CLINICAL DATA:  Staging gastric carcinoma metastatic carcinoma. * Tracking Code: BO * insert EXAM: CT ABDOMEN AND PELVIS WITH CONTRAST TECHNIQUE: Multidetector CT imaging of the abdomen and pelvis was performed using the standard protocol following bolus administration of intravenous contrast. RADIATION DOSE REDUCTION: This exam was performed according to the departmental dose-optimization program which includes automated exposure control, adjustment of the mA  and/or kV according to patient size and/or use of iterative reconstruction technique. CONTRAST:  OMNIPAQUE IOHEXOL 300 MG/ML  SOLN COMPARISON:  CT 09/15/2022 FINDINGS: Lower chest: Lung bases are clear. Hepatobiliary: No focal hepatic lesions. Scattered hypodensities are less conspicuous. Single hypodensity remains along the posterior aspect of the RIGHT hepatic lobe measuring less than 1 cm on image 31/series 2 Postcholecystectomy. Large volume intraperitoneal free fluid surrounds the liver increased from prior. Pancreas: Pancreas is normal. No ductal dilatation. No pancreatic inflammation. Spleen: Normal spleen Adrenals/urinary tract: Adrenal glands normal. RIGHT renal cortical scarring. Ureters and bladder normal. Stomach/Bowel: Stomach appears normal. A mild nonspecific thickening through the pyloric region. Duodenum and small-bowel normal. Small bowel flow centrally within large volume ascites. The colon and rectosigmoid colon are normal. Vascular/Lymphatic: Abdominal aorta is normal caliber. No periportal or retroperitoneal adenopathy. No pelvic adenopathy. Reproductive: Unremarkable Other: Large volume intraperitoneal free fluid. This large volume of fluid limits evaluation of the omentum and peritoneum. No clear evidence of peritoneal nodularity. Musculoskeletal: No aggressive osseous lesion. IMPRESSION: 1. Large volume intraperitoneal free fluid increased from prior. 2. No clear evidence of peritoneal carcinomatosis. Difficult to assess omentum with large volume ascites. 3. Decreased conspicuity of hepatic hypodensities. Single hypodensity remains along the posterior aspect of the RIGHT hepatic lobe. 4. Mild nonspecific thickening of the pyloric region of the stomach. 5. RIGHT renal cortical scarring. Electronically Signed   By: Genevive Bi M.D.   On: 09/28/2023 15:16   US Paracentesis Result Date: 09/28/2023 INDICATION: Patient with history of stage IV gastric cancer, recurrent ascites.  Request received for therapeutic paracentesis. EXAM: ULTRASOUND GUIDED THERAPEUTIC PARACENTESIS MEDICATIONS: 8 mL 1% lidocaine COMPLICATIONS: None immediate. PROCEDURE: Informed written consent was obtained from the patient after a discussion of the risks, benefits and alternatives to treatment. A timeout was performed prior to the initiation of the procedure. Initial ultrasound scanning demonstrates a large amount of ascites within the right mid to lower abdominal quadrant. The right mid to lower abdomen was prepped and draped in the usual sterile fashion. 1% lidocaine was used for local anesthesia. Following this, a 19 gauge, 10-cm, Yueh catheter was introduced. An ultrasound image was saved for documentation purposes. The paracentesis was performed. The catheter was removed and a dressing was applied. The patient tolerated the procedure well without immediate post procedural complication. FINDINGS: A total of approximately 5 liters of yellow fluid was removed. IMPRESSION: Successful ultrasound-guided therapeutic paracentesis yielding 5 liters of peritoneal fluid. Performed by: Artemio Aly Electronically Signed   By: Richarda Overlie M.D.   On: 09/28/2023 12:30  Mr. McClendon was interviewed and examined.  He is well-known to me with a history of metastatic gastric cancer.  He was admitted with intractable vomiting on 10/23/2023.  He was last treated with FOLFOX/nivolumab on 10/18/2023.  He reports significant improvement in abdominal pain since beginning chemotherapy.  He does not have nausea, but vomits after  eating.  He also vomits some liquids.  Vomiting has persisted despite chemotherapy. The vomiting may be related to gastric outlet obstruction or carcinomatosis.  I doubt his current symptoms are related to chemotherapy.  The CT from yesterday reveals large volume ascites and gastric distention.  It is difficult to compare the omental caking due to the degree of ascites.  He is overall clinical status has  improved since beginning FOLFOX/nivolumab despite the persistent vomiting.  He does not have anorexia and pain is much improved.  I think it is reasonable to consider an upper endoscopy to see whether he may benefit from a stent.  I generally do not recommend placement of a peritoneal drain catheter in patients with malignant ascites.  He has not required frequent paracentesis procedures.  Recommendations: Continue evaluation of the intractable vomiting per gastroenterology, consider an upper endoscopy or upper GI study Palliative paracentesis as needed Resume apixaban if there is no plan for endoscopy or placement of a feeding tube Liquid diet Resume oral Dilaudid for pain

## 2023-10-26 NOTE — Plan of Care (Signed)

## 2023-10-26 NOTE — Progress Notes (Addendum)
 PROGRESS NOTE    [name]  WJX:[id number] DOB: 1960-12-08 DOA: 10/23/2023 PCP: Wanda Plump, MD  Chief Complaint  Patient presents with   Emesis    Brief Narrative:   63 year old male with gastric cancer who is undergoing chemotherapy. He has chronic issues with nausea vomiting and abdominal pain however, he feels worse over the past few days. He states that he is having episodes of sudden vomiting and has been unable to tolerate liquids and solids.   GI and oncology consulted and following.  Appreciate assistance.  Assessment & Plan:   Principal Problem:   Intractable nausea and vomiting Active Problems:   DVT, HX OF   Malignant ascites   Gastric cancer (HCC)   Hypokalemia   Metastatic cancer (HCC)   Protein-calorie malnutrition, severe   Adenocarcinoma carcinomatosis (HCC)  Metastatic Gastric Cancer  Intractable Nausea, Vomiting, and Abdominal Pain  Malignant Ascites Appreciate GI assistance Appreciate oncology assistance CT scan with possible ileus (focally dilatedloop of jejunum in LUQ without obvious evidence of obstruction).  Focal thickening of walls of transverse colon (reactive vs other infectious/inflammatory process).  Bowel thickening involving the gastric antrum and pyloris.  Moderate to large ascites with peritoneal enhancement and omental caking. Lipase wnl Repeat paracentesis 3/20, cell count pending  Poor PO intake Follow CT scan and GI eval  History DVT Currently on heparin gtt  Anemia Stable H/H, will trend  Thombocytopenia  mild    DVT prophylaxis: heparin gtt Code Status: full Family Communication: none Disposition:   Status is: Inpatient Remains inpatient appropriate because: need for ongoing GI evaluation   Consultants:  GI oncology  Procedures:  none  Antimicrobials:  Anti-infectives (From admission, onward)    None       Subjective: He was on his way to para when I came to see him  Objective: Vitals:    10/26/23 0555 10/26/23 1158 10/26/23 1413 10/26/23 1433  BP: 122/80 127/81 112/88 116/87  Pulse: 85 79    Resp: 18     Temp: (!) 97.4 F (36.3 C) 98.1 F (36.7 C)    TempSrc:  Oral    SpO2: 99% 100%    Weight:      Height:        Intake/Output Summary (Last 24 hours) at 10/26/2023 1539 Last data filed at 10/26/2023 1000 Gross per 24 hour  Intake 205.2 ml  Output 350 ml  Net -144.8 ml   Filed Weights   10/23/23 1939 10/25/23 1800  Weight: 57.6 kg 57.7 kg    Examination:  General: No acute distress. Weak, making his way carefully to the wheelchair for transport for paracentesis when I came in room. Lungs: unlabored Neurological: Alert and oriented 3. Moves all extremities 4 with equal strength. Cranial nerves II through XII grossly intact. Extremities: No clubbing or cyanosis. No edema.    Data Reviewed: I have personally reviewed following labs and imaging studies  CBC: Recent Labs  Lab 10/23/23 1603 10/24/23 0537 10/25/23 0400 10/26/23 0202  WBC 35.9* 22.2* 9.3 3.5*  NEUTROABS 34.5*  --   --   --   HGB 13.3 10.9* 10.4* 9.7*  HCT 38.9* 33.9* 32.1* 30.7*  MCV 87.8 90.2 89.9 89.5  PLT 253 188 147* 127*    Basic Metabolic Panel: Recent Labs  Lab 10/23/23 1603 10/24/23 0537 10/25/23 0400 10/26/23 0202  NA 133* 130* 129* 131*  K 3.0* 3.3* 3.1* 4.0  CL 92* 95* 98 100  CO2 29 25 25  22  GLUCOSE 118* 80 75 72  BUN 14 11 8 8   CREATININE 0.77 0.65 0.63 0.55*  CALCIUM 8.9 8.4* 8.0* 8.0*  MG 2.1  --  2.3  --   PHOS 3.3  --   --   --     GFR: Estimated Creatinine Clearance: 80.1 mL/min (Emanuel Dowson) (by C-G formula based on SCr of 0.55 mg/dL (L)).  Liver Function Tests: Recent Labs  Lab 10/23/23 1603 10/24/23 0537  AST 24 21  ALT 10 8  ALKPHOS 169* 117  BILITOT 0.8 0.8  PROT 5.8* 5.0*  ALBUMIN 2.6* 2.3*    CBG: No results for input(s): "GLUCAP" in the last 168 hours.   Recent Results (from the past 240 hours)  Resp panel by RT-PCR (RSV, Flu Simmie Garin&B, Covid)  Anterior Nasal Swab     Status: None   Collection Time: 10/23/23  3:50 PM   Specimen: Anterior Nasal Swab  Result Value Ref Range Status   SARS Coronavirus 2 by RT PCR NEGATIVE NEGATIVE Final    Comment: (NOTE) SARS-CoV-2 target nucleic acids are NOT DETECTED.  The SARS-CoV-2 RNA is generally detectable in upper respiratory specimens during the acute phase of infection. The lowest concentration of SARS-CoV-2 viral copies this assay can detect is 138 copies/mL. Arayah Krouse negative result does not preclude SARS-Cov-2 infection and should not be used as the sole basis for treatment or other patient management decisions. Lashika Erker negative result may occur with  improper specimen collection/handling, submission of specimen other than nasopharyngeal swab, presence of viral mutation(s) within the areas targeted by this assay, and inadequate number of viral copies(<138 copies/mL). Yissel Habermehl negative result must be combined with clinical observations, patient history, and epidemiological information. The expected result is Negative.  Fact Sheet for Patients:  BloggerCourse.com  Fact Sheet for Healthcare Providers:  SeriousBroker.it  This test is no t yet approved or cleared by the [place] FDA and  has been authorized for detection and/or diagnosis of SARS-CoV-2 by FDA under an Emergency Use Authorization (EUA). This EUA will remain  in effect (meaning this test can be used) for the duration of the COVID-19 declaration under Section 564(b)(1) of the Act, 21 U.S.C.section 360bbb-3(b)(1), unless the authorization is terminated  or revoked sooner.       Influenza Quran Vasco by PCR NEGATIVE NEGATIVE Final   Influenza B by PCR NEGATIVE NEGATIVE Final    Comment: (NOTE) The Xpert Xpress SARS-CoV-2/FLU/RSV plus assay is intended as an aid in the diagnosis of influenza from Nasopharyngeal swab specimens and should not be used as Elon Lomeli sole basis for treatment. Nasal washings  and aspirates are unacceptable for Xpert Xpress SARS-CoV-2/FLU/RSV testing.  Fact Sheet for Patients: BloggerCourse.com  Fact Sheet for Healthcare Providers: SeriousBroker.it  This test is not yet approved or cleared by the [place] FDA and has been authorized for detection and/or diagnosis of SARS-CoV-2 by FDA under an Emergency Use Authorization (EUA). This EUA will remain in effect (meaning this test can be used) for the duration of the COVID-19 declaration under Section 564(b)(1) of the Act, 21 U.S.C. section 360bbb-3(b)(1), unless the authorization is terminated or revoked.     Resp Syncytial Virus by PCR NEGATIVE NEGATIVE Final    Comment: (NOTE) Fact Sheet for Patients: BloggerCourse.com  Fact Sheet for Healthcare Providers: SeriousBroker.it  This test is not yet approved or cleared by the [place] FDA and has been authorized for detection and/or diagnosis of SARS-CoV-2 by FDA under an Emergency Use Authorization (EUA). This EUA will remain in effect (  meaning this test can be used) for the duration of the COVID-19 declaration under Section 564(b)(1) of the Act, 21 U.S.C. section 360bbb-3(b)(1), unless the authorization is terminated or revoked.  Performed at [facility], [address], [place], [place] [address]          Radiology Studies: US Paracentesis Result Date: 10/26/2023 INDICATION: Patient with history of gastric cancer and recurrent ascites. Request for diagnostic and therapeutic paracentesis. EXAM: ULTRASOUND GUIDED RIGHT LOWER QUADRANT PARACENTESIS MEDICATIONS: 1% plain lidocaine, 5 mL COMPLICATIONS: None immediate. PROCEDURE: Informed written consent was obtained from the patient after [place] discussion of the risks, benefits and alternatives to treatment. Tiyon Sanor timeout was performed prior to the initiation of the procedure. Initial  ultrasound scanning demonstrates Elvin Mccartin large amount of ascites within the right lower abdominal quadrant. The right lower abdomen was prepped and draped in the usual sterile fashion. 1% lidocaine was used for local anesthesia. Following this, Grant Swager 19 gauge, 7-cm, Yueh catheter was introduced. An ultrasound image was saved for documentation purposes. The paracentesis was performed. The catheter was removed and Jolee Critcher dressing was applied. The patient tolerated the procedure well without immediate post procedural complication. FINDINGS: Kalkidan Caudell total of approximately 1.2 L of hazy yellow fluid was removed. Samples were sent to the laboratory as requested by the clinical team. IMPRESSION: Successful ultrasound-guided paracentesis yielding 1.2 liters of peritoneal fluid. Procedure performed by Brayton El PA-C and supervised by Dr. Ruel Favors Electronically Signed   By: Judie Petit.  Shick M.D.   On: 10/26/2023 15:19   CT ABDOMEN PELVIS W CONTRAST Result Date: 10/25/2023 CLINICAL DATA:  Epigastric pain. History of gastric cancer. Assess treatment response. Concern for possible carcinomatosis. EXAM: CT ABDOMEN AND PELVIS WITH CONTRAST TECHNIQUE: Multidetector CT imaging of the abdomen and pelvis was performed using the standard protocol following bolus administration of intravenous contrast. RADIATION DOSE REDUCTION: This exam was performed according to the departmental dose-optimization program which includes automated exposure control, adjustment of the mA and/or kV according to patient size and/or use of iterative reconstruction technique. CONTRAST:  OMNIPAQUE IOHEXOL 300 MG/ML  SOLN COMPARISON:  09/28/2023, 03/28/2023. FINDINGS: Lower chest: The heart is normal in size and there is Landis Cassaro small pericardial effusion. Emphysematous changes are present in the lungs. Mild atelectasis or scarring is noted at the lung bases. Hepatobiliary: Scattered subcentimeter hypodensities are present in the liver common decreased in size from 03/28/2023  and not well seen on 09/28/2023. The gallbladder is surgically absent. No biliary ductal dilatation is seen. Pancreas: Unremarkable. No pancreatic ductal dilatation or surrounding inflammatory changes. Spleen: Normal in size without focal abnormality. Adrenals/Urinary Tract: The adrenal glands are within normal limits. The kidneys enhance symmetrically. Subcentimeter hypodensities are present in the kidneys bilaterally which are too small to further characterize. Renal cortical scarring is present on the right. No renal calculus or hydronephrosis bilaterally. No obvious bladder abnormality is seen, however examination is limited due to hardware artifact. Stomach/Bowel: The stomach is distended with fluid. Gastric wall thickening is noted about the antrum and proximal duodenum. Billiejean Schimek focally dilated loop of jejunum is noted in the mid left abdomen measuring up to 3.6 cm, with no obvious obstruction. Contrast is identified in the colon. There is thickening of the walls of the mid transverse colon. The appendix is not seen. Vascular/Lymphatic: Aortic atherosclerosis. No enlarged abdominal or pelvic lymph nodes. Reproductive: Prostate is unremarkable. Other: There is moderate-to-large ascites with peritoneal enhancement and omental caking in the upper abdomen, concerning for peritoneal carcinomatosis. Musculoskeletal: Total hip  arthroplasty changes are present on the left. Degenerative changes are present in the thoracolumbar spine. Mekaila Tarnow pars defect is noted at L5 on the left. No acute osseous abnormality is seen. IMPRESSION: 1. Focally dilated loop of jejunum in the left upper quadrant without obvious evidence of obstruction. Findings may represent possible ileus. 2. Yina Riviere focal thickening of the walls of the mid transverse colon, which may be reactive versus other infectious or inflammatory process. 3. Bowel wall thickening involving the gastric antrum and pylorus, similar in appearance to the prior exam. 4. Moderate-to-large  ascites with peritoneal enhancement and omental caking, suspicious for peritoneal carcinomatosis. 5. Aortic atherosclerosis. 6. Remaining incidental findings as described above. Electronically Signed   By: Thornell Sartorius M.D.   On: 10/25/2023 20:53        Scheduled Meds:  Chlorhexidine Gluconate Cloth  6 each Topical Daily   feeding supplement  1 Container Oral TID BM   metoCLOPramide (REGLAN) injection  10 mg Intravenous Q6H   pantoprazole (PROTONIX) IV  40 mg Intravenous QHS   scopolamine  1 patch Transdermal Q72H   sodium chloride flush  3 mL Intravenous Q12H   SMOG  400 mL Rectal Once   Continuous Infusions:  heparin 800 Units/hr (10/25/23 1957)     LOS: 1 day    Time spent: over 30 min    Lacretia Nicks, MD [facility]   To contact the attending provider between 7A-7P or the covering provider during after hours 7P-7A, please log into the web site [email or web address] and access using universal [place] password for that web site. If you do not have the password, please call the hospital operator.  10/26/2023, 3:39 PM

## 2023-10-27 ENCOUNTER — Inpatient Hospital Stay (HOSPITAL_COMMUNITY)

## 2023-10-27 ENCOUNTER — Encounter (HOSPITAL_COMMUNITY): Payer: Self-pay | Admitting: Certified Registered Nurse Anesthetist

## 2023-10-27 DIAGNOSIS — C163 Malignant neoplasm of pyloric antrum: Secondary | ICD-10-CM | POA: Diagnosis not present

## 2023-10-27 DIAGNOSIS — R112 Nausea with vomiting, unspecified: Secondary | ICD-10-CM | POA: Diagnosis not present

## 2023-10-27 DIAGNOSIS — E876 Hypokalemia: Secondary | ICD-10-CM | POA: Diagnosis not present

## 2023-10-27 LAB — CBC WITH DIFFERENTIAL/PLATELET
Abs Immature Granulocytes: 0 10*3/uL (ref 0.00–0.07)
Basophils Absolute: 0 10*3/uL (ref 0.0–0.1)
Basophils Relative: 1 %
Eosinophils Absolute: 0 10*3/uL (ref 0.0–0.5)
Eosinophils Relative: 2 %
HCT: 31.1 % — ABNORMAL LOW (ref 39.0–52.0)
Hemoglobin: 9.7 g/dL — ABNORMAL LOW (ref 13.0–17.0)
Immature Granulocytes: 0 %
Lymphocytes Relative: 58 %
Lymphs Abs: 0.6 10*3/uL — ABNORMAL LOW (ref 0.7–4.0)
MCH: 28.1 pg (ref 26.0–34.0)
MCHC: 31.2 g/dL (ref 30.0–36.0)
MCV: 90.1 fL (ref 80.0–100.0)
Monocytes Absolute: 0.1 10*3/uL (ref 0.1–1.0)
Monocytes Relative: 13 %
Neutro Abs: 0.3 10*3/uL — CL (ref 1.7–7.7)
Neutrophils Relative %: 26 %
Platelets: 113 10*3/uL — ABNORMAL LOW (ref 150–400)
RBC: 3.45 MIL/uL — ABNORMAL LOW (ref 4.22–5.81)
RDW: 15.2 % (ref 11.5–15.5)
WBC: 1.1 10*3/uL — CL (ref 4.0–10.5)
nRBC: 0 % (ref 0.0–0.2)

## 2023-10-27 LAB — BASIC METABOLIC PANEL
Anion gap: 10 (ref 5–15)
BUN: 8 mg/dL (ref 6–20)
CO2: 21 mmol/L — ABNORMAL LOW (ref 22–32)
Calcium: 8.3 mg/dL — ABNORMAL LOW (ref 8.9–10.3)
Chloride: 100 mmol/L (ref 98–111)
Creatinine, Ser: 0.65 mg/dL (ref 0.61–1.24)
GFR, Estimated: >60 mL/min (ref >60)
Glucose, Bld: 75 mg/dL (ref 70–99)
Potassium: 3.8 mmol/L (ref 3.5–5.1)
Sodium: 131 mmol/L — ABNORMAL LOW (ref 135–145)

## 2023-10-27 LAB — HEPARIN LEVEL (UNFRACTIONATED)
Heparin Unfractionated: 0.28 [IU]/mL — ABNORMAL LOW (ref 0.30–0.70)
Heparin Unfractionated: 0.35 [IU]/mL (ref 0.30–0.70)
Heparin Unfractionated: 0.44 [IU]/mL (ref 0.30–0.70)

## 2023-10-27 MED ORDER — METRONIDAZOLE 500 MG/100ML IV SOLN
500.0000 mg | Freq: Two times a day (BID) | INTRAVENOUS | Status: DC
  Administered 2023-10-27 – 2023-10-31 (×8): 500 mg via INTRAVENOUS
  Filled 2023-10-27 (×8): qty 100

## 2023-10-27 MED ORDER — VANCOMYCIN HCL 750 MG/150ML IV SOLN
750.0000 mg | Freq: Two times a day (BID) | INTRAVENOUS | Status: DC
  Administered 2023-10-27 – 2023-10-31 (×8): 750 mg via INTRAVENOUS
  Filled 2023-10-27 (×8): qty 150

## 2023-10-27 MED ORDER — VANCOMYCIN HCL IN DEXTROSE 1-5 GM/200ML-% IV SOLN
1000.0000 mg | Freq: Once | INTRAVENOUS | Status: AC
Start: 2023-10-27 — End: 2023-10-27
  Administered 2023-10-27: 1000 mg via INTRAVENOUS
  Filled 2023-10-27: qty 200

## 2023-10-27 MED ORDER — SODIUM CHLORIDE 0.9 % IV SOLN
2.0000 g | Freq: Once | INTRAVENOUS | Status: AC
  Administered 2023-10-27: 2 g via INTRAVENOUS
  Filled 2023-10-27: qty 12.5

## 2023-10-27 MED ORDER — SODIUM CHLORIDE 0.9 % IV SOLN
2.0000 g | Freq: Three times a day (TID) | INTRAVENOUS | Status: DC
  Administered 2023-10-27 – 2023-10-31 (×11): 2 g via INTRAVENOUS
  Filled 2023-10-27 (×11): qty 12.5

## 2023-10-27 MED ORDER — SODIUM CHLORIDE 0.9 % IV SOLN: INTRAVENOUS | Status: AC

## 2023-10-27 NOTE — Progress Notes (Addendum)
 Progress Note   Subjective  Hospital day #5 Chief Complaint: Nausea and vomiting instead of gastric cancer and peritoneal carcinomatosis  Patient describes eating fairly normal food last night, but not doing well this morning with increased nausea and epigastric discomfort, per him Dr. Truett Perna wants him back on just a liquid diet and would like a feeding tube placed.  Patient just wants to gain some weight and feel better.  He was able to have a bowel movement today after not having 1 since 10/24/2023.   Objective   Vital signs in last 24 hours: Temp:  [98.1 F (36.7 C)-99 F (37.2 C)] 99 F (37.2 C) (03/21 0432) Pulse Rate:  [79-103] 103 (03/21 0432) Resp:  [18] 18 (03/21 0432) BP: (93-127)/(74-88) 105/74 (03/21 0432) SpO2:  [98 %-100 %] 100 % (03/21 0432) Last BM Date : 10/25/23 General: AA male in NAD Heart:  Regular rate and rhythm; no murmurs Lungs: Respirations even and unlabored, lungs CTA bilaterally Abdomen:  Soft, mild epigastric ttp and nondistended. Normal bowel sounds. Psych:  Cooperative. Normal mood and affect.  Intake/Output from previous day: 03/20 0701 - 03/21 0700 In: 360 [P.O.:360] Out: 750 [Urine:750] Intake/Output this shift: Total I/O In: 114 [P.O.:114] Out: 150 [Urine:150]  Lab Results: Recent Labs    10/26/23 0202 10/26/23 1800 10/27/23 0353  WBC 3.5* 1.9* 1.1*  HGB 9.7* 10.5* 9.7*  HCT 30.7* 32.8* 31.1*  PLT 127* 125* 113*   BMET Recent Labs    10/25/23 0400 10/26/23 0202 10/27/23 0353  NA 129* 131* 131*  K 3.1* 4.0 3.8  CL 98 100 100  CO2 25 22 21*  GLUCOSE 75 72 75  BUN 8 8 8   CREATININE 0.63 0.55* 0.65  CALCIUM 8.0* 8.0* 8.3*   Studies/Results: US Paracentesis Result Date: 10/26/2023 INDICATION: Patient with history of gastric cancer and recurrent ascites. Request for diagnostic and therapeutic paracentesis. EXAM: ULTRASOUND GUIDED RIGHT LOWER QUADRANT PARACENTESIS MEDICATIONS: 1% plain lidocaine, 5 mL COMPLICATIONS:  None immediate. PROCEDURE: Informed written consent was obtained from the patient after a discussion of the risks, benefits and alternatives to treatment. A timeout was performed prior to the initiation of the procedure. Initial ultrasound scanning demonstrates a large amount of ascites within the right lower abdominal quadrant. The right lower abdomen was prepped and draped in the usual sterile fashion. 1% lidocaine was used for local anesthesia. Following this, a 19 gauge, 7-cm, Yueh catheter was introduced. An ultrasound image was saved for documentation purposes. The paracentesis was performed. The catheter was removed and a dressing was applied. The patient tolerated the procedure well without immediate post procedural complication. FINDINGS: A total of approximately 1.2 L of hazy yellow fluid was removed. Samples were sent to the laboratory as requested by the clinical team. IMPRESSION: Successful ultrasound-guided paracentesis yielding 1.2 liters of peritoneal fluid. Procedure performed by Brayton El PA-C and supervised by Dr. Ruel Favors Electronically Signed   By: Judie Petit.  Shick M.D.   On: 10/26/2023 15:19   CT ABDOMEN PELVIS W CONTRAST Result Date: 10/25/2023 CLINICAL DATA:  Epigastric pain. History of gastric cancer. Assess treatment response. Concern for possible carcinomatosis. EXAM: CT ABDOMEN AND PELVIS WITH CONTRAST TECHNIQUE: Multidetector CT imaging of the abdomen and pelvis was performed using the standard protocol following bolus administration of intravenous contrast. RADIATION DOSE REDUCTION: This exam was performed according to the departmental dose-optimization program which includes automated exposure control, adjustment of the mA and/or kV according to patient size and/or use of iterative reconstruction technique.  CONTRAST:  OMNIPAQUE IOHEXOL 300 MG/ML  SOLN COMPARISON:  09/28/2023, 03/28/2023. FINDINGS: Lower chest: The heart is normal in size and there is a small pericardial  effusion. Emphysematous changes are present in the lungs. Mild atelectasis or scarring is noted at the lung bases. Hepatobiliary: Scattered subcentimeter hypodensities are present in the liver common decreased in size from 03/28/2023 and not well seen on 09/28/2023. The gallbladder is surgically absent. No biliary ductal dilatation is seen. Pancreas: Unremarkable. No pancreatic ductal dilatation or surrounding inflammatory changes. Spleen: Normal in size without focal abnormality. Adrenals/Urinary Tract: The adrenal glands are within normal limits. The kidneys enhance symmetrically. Subcentimeter hypodensities are present in the kidneys bilaterally which are too small to further characterize. Renal cortical scarring is present on the right. No renal calculus or hydronephrosis bilaterally. No obvious bladder abnormality is seen, however examination is limited due to hardware artifact. Stomach/Bowel: The stomach is distended with fluid. Gastric wall thickening is noted about the antrum and proximal duodenum. A focally dilated loop of jejunum is noted in the mid left abdomen measuring up to 3.6 cm, with no obvious obstruction. Contrast is identified in the colon. There is thickening of the walls of the mid transverse colon. The appendix is not seen. Vascular/Lymphatic: Aortic atherosclerosis. No enlarged abdominal or pelvic lymph nodes. Reproductive: Prostate is unremarkable. Other: There is moderate-to-large ascites with peritoneal enhancement and omental caking in the upper abdomen, concerning for peritoneal carcinomatosis. Musculoskeletal: Total hip arthroplasty changes are present on the left. Degenerative changes are present in the thoracolumbar spine. A pars defect is noted at L5 on the left. No acute osseous abnormality is seen. IMPRESSION: 1. Focally dilated loop of jejunum in the left upper quadrant without obvious evidence of obstruction. Findings may represent possible ileus. 2. A focal thickening of the  walls of the mid transverse colon, which may be reactive versus other infectious or inflammatory process. 3. Bowel wall thickening involving the gastric antrum and pylorus, similar in appearance to the prior exam. 4. Moderate-to-large ascites with peritoneal enhancement and omental caking, suspicious for peritoneal carcinomatosis. 5. Aortic atherosclerosis. 6. Remaining incidental findings as described above. Electronically Signed   By: Thornell Sartorius M.D.   On: 10/25/2023 20:53    Assessment / Plan:   Assessment: 1.  Intractable nausea and vomiting: Difficulty tolerating anything by mouth for the past month here and there, worse over the past 7 days, chronic abdominal pain which worsens with a bad day vomiting, repeat CTAP with carcinomatosis which is likely adding to patient's issues, will likely require a feeding tube of some sort in the coming days 2.  Gastric cancer: Diagnosed December 2024, metastatic poorly differentiated carcinoma, undergoing chemotherapy, ultrasound 3/18 moderate diffuse ascites status post paracentesis 3/14 with 4.6 L removed and repeat 3/20 with 1.2 L removed 3.  History of DVT: Currently on IV heparin, had been on Eliquis at home 4.  Hyponatremia/hypokalemia  Plan: 1.  Discussed with patient that Dr. Leone Payor does not see much utility in repeating EGD at this moment, but patient seems to still not be doing well even after paracentesis yesterday.  Will need some form of nutrition in the coming days.  Will let Dr. Leone Payor see the patient and review. 2.  Continue bowel regimen 3.  Please await further recommendations from Dr. Leone Payor later today  Thank you for your kind consultation.    LOS: 2 days   Unk Lightning  10/27/2023, 11:40 AM  Addendum: 10/27/2023 12:41 PM.  Dr. Leone Payor and  Dr. Truett Perna had a conversation.  Both have decided to go ahead with the EGD first for this patient.  I have set this up for tomorrow 10/28/2023.  I did let the patient know the  change of plans.  He will be n.p.o. at midnight.  He would like his wife to know timing of this procedure when it is decided.  Hyacinth Meeker, PA-C     [facility] Attending   I have taken an interval history, reviewed the chart and examined the patient. I agree with the Advanced Practitioner's note, impression and recommendations with the following additions:  Will do EGD to see if there is anatomy that could allow a stent. EGD is purely diagnostic tomorrow. Challenging situation. Patient has been made NPO as he had more vomiting.  Iva Boop, MD, [facility] See AMION on call - gastroenterology for best contact person 10/27/2023 3:54 PM  Patient has febrile neutropenia - will need to postpone EGD until ANC > 1000 and will hold heparin 4 hours before when we do EGD  Iva Boop, MD, [facility] See Loretha Stapler on call - gastroenterology for best contact person 10/27/2023 6:15 PM

## 2023-10-27 NOTE — Progress Notes (Signed)
 Pharmacy Antibiotic Note  [name] is a 63 y.o. male admitted on 10/23/2023 with  FN .  Pharmacy has been consulted for vancomycin and cefepime dosing.  Plan: Cefepime 2 gr IV q8h  Vancomycin 1000 mg IV x1, then vancomycin 750 mg IV q12h ( AUC 509, Scr rounded to 0.8, wt 57.7 kg)   Height: 6\' 1"  (185.4 cm) Weight: 57.7 kg (127 lb 1.6 oz) IBW/kg (Calculated) : 79.9  Temp (24hrs), Avg:98.7 F (37.1 C), Min:98.3 F (36.8 C), Max:99 F (37.2 C)  Recent Labs  Lab 10/23/23 1603 10/24/23 0537 10/25/23 0400 10/26/23 0202 10/26/23 1800 10/27/23 0353  WBC 35.9* 22.2* 9.3 3.5* 1.9* 1.1*  CREATININE 0.77 0.65 0.63 0.55*  --  0.65    Estimated Creatinine Clearance: 80.1 mL/min (by C-G formula based on SCr of 0.65 mg/dL).    Allergies  Allergen Reactions   Pravastatin Itching   Simvastatin Itching    Antimicrobials this admission: 3/21 vancomycin >>  3/21 cefepime >>   Dose adjustments this admission:   Microbiology results: 3/17 WUJ:[id number]    Adalberto Cole, PharmD, BCPS 10/27/2023 12:19 PM

## 2023-10-27 NOTE — Progress Notes (Signed)
 Patient told this nurse he vomited up his breakfast and lunch.

## 2023-10-27 NOTE — Progress Notes (Signed)
 PHARMACY - ANTICOAGULATION CONSULT NOTE  Pharmacy Consult for heparin Indication:  h/o renal infarct and h/o DVT (PTA Eliquis on hold)  Allergies  Allergen Reactions   Pravastatin Itching   Simvastatin Itching    Patient Measurements: Height: 6\' 1"  (185.4 cm) Weight: 57.7 kg (127 lb 1.6 oz) IBW/kg (Calculated) : 79.9 Heparin Dosing Weight: 57.7 kg  Vital Signs: Temp: 99 F (37.2 C) (03/21 0432) BP: 105/74 (03/21 0432) Pulse Rate: 103 (03/21 0432)  Labs: Recent Labs    10/25/23 0400 10/25/23 0400 10/25/23 1230 10/25/23 1825 10/26/23 0202 10/26/23 1000 10/26/23 1800 10/27/23 0353 10/27/23 1130  HGB 10.4*  --   --   --  9.7*  --  10.5* 9.7*  --   HCT 32.1*  --   --   --  30.7*  --  32.8* 31.1*  --   PLT 147*  --   --   --  127*  --  125* 113*  --   APTT 142*  --  99* 118* 97*  --   --   --   --   HEPARINUNFRC  --    < > 0.79*  --  0.42 0.35  --  0.28* 0.35  CREATININE 0.63  --   --   --  0.55*  --   --  0.65  --    < > = values in this interval not displayed.    Estimated Creatinine Clearance: 80.1 mL/min (by C-G formula based on SCr of 0.65 mg/dL).   Medical History: Past Medical History:  Diagnosis Date   Chest pain    stres test neg x 2, cath 5/12; minimal LAD irregs; no obs CAD; normal LVF   Clotting disorder (HCC)    dvt   Deep vein thrombosis (HCC) 10/06/2010   GERD (gastroesophageal reflux disease)    History of DVT of lower extremity    on chronic coumadin   Hypertension    Internal hemorrhoids    Cscope 2007   PAD (peripheral artery disease) (HCC)    a. left leg ischemia tx wtih embolectomy of left fem, pop, tib arteries with Dr. Edilia Bo - 08/2006;  b. known occlusion of right pop with collats - med Rx;  c.ABI's 8/09: R 1.0; L 0.91    PFO (patent foramen ovale)    per 08/2006 discharge summary, TEE showed EF 60%, small PFO with minimal right-to-left shunt with Valsalva; not felt to be the source of emboli, lifelong Coumadin recommended   Pneumonia     history of   Polysubstance abuse (HCC)    marijuana only   Renal infarct [facility])    R    Assessment: Patient is a 63 y.o. male with hx HTN, PAD, renal infarct, stage IV gastric cancer on chemo, and LE DVT in 2008 on Eliquis PTA who presented to the ED on 10/23/23 with c/o severe nausea and vomiting. Pt received last dose (cycle 5) of FOLFOX/nivolumab on 10/18/23. He reported nausea with recurrent bouts of vomiting since 10/20/23, which couldn't be improved by Protonix, Carafate, and [name] at home. Anticoagulation changed to heparin due to poor oral intake secondary to nausea and vomiting and in case invasive intervention is needed.  - Last dose Eliquis was taken on 3/17 at 10am.   Today, 10/27/23: - Heparin level collected at 1130 am therapeutic at 0.35 on heparin drip 950 units/hr - Hgb low/stable 9.7, plt drop to 113, WBC drop to 1.1 - likely due to recent chemo - No bleeding  noted per RN  Goal of Therapy:  Heparin level 0.3-0.7 units/ml aPTT 66-102 seconds Monitor platelets by anticoagulation protocol: Yes   Plan:  - Continue heparin drip at 950 units/hr - Repeat confirmatory heparin level in 6 hours - Daily CBC - Monitor for s/sx bleeding   Thank you for allowing pharmacy to be a part of this patient's care.  Tory Emerald, PharmD Candidate 10/27/2023 12:29 PM

## 2023-10-27 NOTE — Progress Notes (Addendum)
 [facility]  PROGRESS NOTE  [name] YQM:[id number] DOB: 1961-02-26 DOA: 10/23/2023 PCP: Wanda Plump, MD   Brief HPI:   63 year old male with gastric cancer who is undergoing chemotherapy. He has chronic issues with nausea vomiting and abdominal pain however, he feels worse over the past few days. He states that he is having episodes of sudden vomiting and has been unable to tolerate liquids and solids.     Assessment/Plan:     Metastatic Gastric Cancer  Intractable Nausea, Vomiting, and Abdominal Pain  Malignant Ascites Appreciate GI assistance Appreciate oncology assistance CT scan with possible ileus (focally dilatedloop of jejunum in LUQ without obvious evidence of obstruction).  Focal thickening of walls of transverse colon (reactive vs other infectious/inflammatory process).  Bowel thickening involving the gastric antrum and pyloris.  Moderate to large ascites with peritoneal enhancement and omental caking. Lipase wnl Repeat paracentesis 3/20, total cell count 473, neutrophils 3%    Poor PO intake Appreciate GI input -Plan for EGD in a.m. to see if the anatomy of could allow stent placement -N.p.o.   History DVT Currently on heparin gtt -Will check with GI when to stop heparin for EGD   Anemia Stable H/H, will trend   Thombocytopenia  mild  Febrile neutropenia -Had low-grade fever last night -Will start vancomycin and cefepime for febrile neutropenia -Will add Flagyl 500 mg p.o. every 12 hours -Check chest x-ray, UA, blood cultures x 2  Medications     Chlorhexidine Gluconate Cloth  6 each Topical Daily   feeding supplement  1 Container Oral TID BM   feeding supplement  237 mL Oral BID BM   metoCLOPramide (REGLAN) injection  10 mg Intravenous Q6H   nicotine  21 mg Transdermal Q24H   pantoprazole (PROTONIX) IV  40 mg Intravenous QHS   scopolamine  1 patch Transdermal Q72H   sodium chloride flush  3 mL Intravenous Q12H   SMOG  400 mL Rectal  Once     Data Reviewed:   CBG:  No results for input(s): "GLUCAP" in the last 168 hours.  SpO2: 100 %    Vitals:   10/26/23 1413 10/26/23 1433 10/26/23 2021 10/27/23 0432  BP: 112/88 116/87 93/74 105/74  Pulse:   92 (!) 103  Resp:   18 18  Temp:   98.3 F (36.8 C) 99 F (37.2 C)  TempSrc:      SpO2:   98% 100%  Weight:      Height:          Data Reviewed:  Basic Metabolic Panel: Recent Labs  Lab 10/23/23 1603 10/24/23 0537 10/25/23 0400 10/26/23 0202 10/27/23 0353  NA 133* 130* 129* 131* 131*  K 3.0* 3.3* 3.1* 4.0 3.8  CL 92* 95* 98 100 100  CO2 29 25 25 22  21*  GLUCOSE 118* 80 75 72 75  BUN 14 11 8 8 8   CREATININE 0.77 0.65 0.63 0.55* 0.65  CALCIUM 8.9 8.4* 8.0* 8.0* 8.3*  MG 2.1  --  2.3  --   --   PHOS 3.3  --   --   --   --     CBC: Recent Labs  Lab 10/23/23 1603 10/24/23 0537 10/25/23 0400 10/26/23 0202 10/26/23 1800 10/27/23 0353  WBC 35.9* 22.2* 9.3 3.5* 1.9* 1.1*  NEUTROABS 34.5*  --   --   --  0.6* 0.3*  HGB 13.3 10.9* 10.4* 9.7* 10.5* 9.7*  HCT 38.9* 33.9* 32.1* 30.7* 32.8* 31.1*  MCV 87.8  90.2 89.9 89.5 89.1 90.1  PLT 253 188 147* 127* 125* 113*    LFT Recent Labs  Lab 10/23/23 1603 10/24/23 0537  AST 24 21  ALT 10 8  ALKPHOS 169* 117  BILITOT 0.8 0.8  PROT 5.8* 5.0*  ALBUMIN 2.6* 2.3*     Antibiotics: Anti-infectives (From admission, onward)    None        DVT prophylaxis: Full dose heparin  Code Status: Full code  Family Communication: No family at bedside   CONSULTS    Subjective   Started having vomiting after eating breakfast and lunch.  Denies abdominal pain   Objective    Physical Examination:   General-appears in no acute distress Heart-S1-S2, regular, no murmur auscultated Lungs-clear to auscultation bilaterally, no wheezing or crackles auscultated Abdomen-soft, nontender, no organomegaly Extremities-no edema in the lower extremities Neuro-alert, oriented x3, no focal deficit  noted  Status is: Inpatient:             Meredeth Ide   [facility] If 7PM-7AM, please contact night-coverage at [email or web address], Office  [phone]   10/27/2023, 11:53 AM  LOS: 2 days

## 2023-10-27 NOTE — Progress Notes (Addendum)
 [name]   DOB:1960-10-13   WU#:[id number]      ASSESSMENT & PLAN:  Gastric cancer, stage IV Peritoneal carcinomatosis  - Initially diagnosed December 2024 - Pathology shows metastatic poorly differentiated carcinoma consistent with primary gastric carcinoma, with signet cell features - Patient was started on FOLFOX/nivolumab cycle 1 on 08/16/2023.  Plan for every 2 weeks.  Completed 3 cycles and was admitted in February 2025 with intractable nausea/vomiting and dehydration.   - Last treated with FOLFOX/nivolumab on 10/18/2023, G-CSF 10/20/2023 - CT abd/pelvis done 3/19 suspicious for peritoneal carcinomatosis - overall clinical status has improved since beginning FOLFOX/nivolumab despite the persistent vomiting. He does not have anorexia and pain is much improved.  -Medical oncology/Dr. Truett Perna has been following.   Ascites, malignant Possible Ileus Abdominal pain Nausea/vomiting -Vomiting is improving patient reports no vomiting today. - Patient rates abdominal pain 7-8/10. -Secondary to malignancy - Ultrasound of abdomen done 10/24/2023 showed moderate diffuse ascites. - Has had previous paracentesis prior to this hospitalization.  Status post paracentesis done 10/20/2023 with 4.6 L removed.  Shows adenocarcinoma with signet ring cell features - Seen by GI, no plan for repeat EGD at this time.  - CT abd/pelvis done 3/19 which showed possible ileus. Also moderate to large ascites with peritoneal enhancement and omental caking, suspicious for peritoneal carcinomatosis.  - He reports significant improvement in abdominal pain since beginning chemotherapy.  He does not have nausea, but vomits after eating.  He also vomits some liquids.  Vomiting has persisted despite chemotherapy. The vomiting may be related to gastric outlet obstruction or carcinomatosis. - Palliative paracentesis as needed -last paracentesis done 3/20 with 1.2L removed.  -Continue pain management and antiemetics as  ordered - Continue to monitor   Poor appetite Weight loss - Patient reports he had been unable to eat for several days prior to discharge.  Reports he is eating a little better at this time full liquids and so far is tolerating. - Diet advanced to soft diet.  Patient tolerated somewhat okay, had pot roast and mashed potatoes for dinner last night however reports vomiting x 1 at 4:30 AM today. - Encourage more out of bed to chair - Recommend Surg eval for feeding tube.     History of DVT - On IV heparin drip at this time - Has been on Eliquis at home, resume if there is no plan for endoscopy or placement of feeding tube.  - Monitor closely for bleeding   Pancytopenia: Leukopenia - counts falling.  WBC 1.1 with ANC 0.3 - likely due to recent chemotherapy - monitor closely  Anemia - Hemoglobin 9.7, drop from 10.5 - Likely multifactorial due to recent therapy, nutritional deficiencies and malignancy - Continue to monitor CBC with differential   Thrombocytopenia - Platelets 113k today, drop from 125k  - Continue to monitor CBC with differential   Fever -Patient with elevated temp overnight 103 - Recommend antibiotics, discussed with Dr. Sharl Ma - Monitor fever curve   Code Status Full  Subjective:  Patient seen awake and alert laying in bed.  Ongoing abdominal pain rates as 6/10, patient just ran call bell for pain med.  Reports that he ate mashed potatoes with pot roast for dinner last night however vomited x 1 at 4:30 AM.  Otherwise no acute complaints.  Objective:  Vitals:   10/26/23 2021 10/27/23 0432  BP: 93/74 105/74  Pulse: 92 (!) 103  Resp: 18 18  Temp: 98.3 F (36.8 C) 99 F (37.2 C)  SpO2:  98% 100%     Intake/Output Summary (Last 24 hours) at 10/27/2023 0848 Last data filed at 10/27/2023 9562 Gross per 24 hour  Intake 474 ml  Output 900 ml  Net -426 ml     REVIEW OF SYSTEMS:   Constitutional: Denies fevers, chills or abnormal night sweats Eyes: Denies  blurriness of vision, double vision or watery eyes Ears, nose, mouth, throat, and face: Denies mucositis or sore throat Respiratory: Denies cough, dyspnea or wheezes Cardiovascular: Denies palpitation, chest discomfort or lower extremity swelling Gastrointestinal: + Vomiting, + abdominal pain Skin: Denies abnormal skin rashes Lymphatics: Denies new lymphadenopathy or easy bruising Neurological: Denies numbness, tingling or new weaknesses Behavioral/Psych: Mood is stable, no new changes  All other systems were reviewed with the patient and are negative.  PHYSICAL EXAMINATION: ECOG PERFORMANCE STATUS: 2 - Symptomatic, <50% confined to bed  Vitals:   10/26/23 2021 10/27/23 0432  BP: 93/74 105/74  Pulse: 92 (!) 103  Resp: 18 18  Temp: 98.3 F (36.8 C) 99 F (37.2 C)  SpO2: 98% 100%   Filed Weights   10/23/23 1939 10/25/23 1800  Weight: 127 lb (57.6 kg) 127 lb 1.6 oz (57.7 kg)    GENERAL: alert, no distress and comfortable SKIN: skin color, texture, turgor are normal, no rashes or significant lesions EYES: normal, conjunctiva are pink and non-injected, sclera clear OROPHARYNX: no exudate, no erythema and lips, buccal mucosa, and tongue normal  NECK: supple, thyroid normal size, non-tender, without nodularity LYMPH: no palpable lymphadenopathy in the cervical, axillary or inguinal LUNGS: clear to auscultation and percussion with normal breathing effort HEART: regular rate & rhythm and no murmurs and no lower extremity edema ABDOMEN: abdomen soft, non-tender and normal bowel sounds MUSCULOSKELETAL: no cyanosis of digits and no clubbing  PSYCH: alert & oriented x 3 with fluent speech NEURO: no focal motor/sensory deficits   All questions were answered. The patient knows to call the clinic with any problems, questions or concerns.   The total time spent in the appointment was 40 minutes encounter with patient including review of chart and various tests results, discussions about  plan of care and coordination of care plan  [name], NP 10/27/2023 8:48 AM    Labs Reviewed:  Lab Results  Component Value Date   WBC 1.1 (LL) 10/27/2023   HGB 9.7 (L) 10/27/2023   HCT 31.1 (L) 10/27/2023   MCV 90.1 10/27/2023   PLT 113 (L) 10/27/2023   Recent Labs    10/18/23 0752 10/23/23 1603 10/24/23 0537 10/25/23 0400 10/26/23 0202 10/27/23 0353  NA 135 133* 130* 129* 131* 131*  K 3.8 3.0* 3.3* 3.1* 4.0 3.8  CL 98 92* 95* 98 100 100  CO2 23 29 25 25 22  21*  GLUCOSE 107* 118* 80 75 72 75  BUN 11 14 11 8 8 8   CREATININE 1.09 0.77 0.65 0.63 0.55* 0.65  CALCIUM 9.6 8.9 8.4* 8.0* 8.0* 8.3*  GFRNONAA >60 >60 >60 >60 >60 >60  PROT 6.5 5.8* 5.0*  --   --   --   ALBUMIN 3.5 2.6* 2.3*  --   --   --   AST 16 24 21   --   --   --   ALT 9 10 8   --   --   --   ALKPHOS 117 169* 117  --   --   --   BILITOT 0.5 0.8 0.8  --   --   --   BILIDIR  --   --  <  0.1  --   --   --   IBILI  --   --  NOT CALCULATED  --   --   --     Studies Reviewed:  US Paracentesis Result Date: 10/26/2023 INDICATION: Patient with history of gastric cancer and recurrent ascites. Request for diagnostic and therapeutic paracentesis. EXAM: ULTRASOUND GUIDED RIGHT LOWER QUADRANT PARACENTESIS MEDICATIONS: 1% plain lidocaine, 5 mL COMPLICATIONS: None immediate. PROCEDURE: Informed written consent was obtained from the patient after a discussion of the risks, benefits and alternatives to treatment. A timeout was performed prior to the initiation of the procedure. Initial ultrasound scanning demonstrates a large amount of ascites within the right lower abdominal quadrant. The right lower abdomen was prepped and draped in the usual sterile fashion. 1% lidocaine was used for local anesthesia. Following this, a 19 gauge, 7-cm, Yueh catheter was introduced. An ultrasound image was saved for documentation purposes. The paracentesis was performed. The catheter was removed and a dressing was applied. The patient tolerated  the procedure well without immediate post procedural complication. FINDINGS: A total of approximately 1.2 L of hazy yellow fluid was removed. Samples were sent to the laboratory as requested by the clinical team. IMPRESSION: Successful ultrasound-guided paracentesis yielding 1.2 liters of peritoneal fluid. Procedure performed by Brayton El PA-C and supervised by Dr. Ruel Favors Electronically Signed   By: Judie Petit.  Shick M.D.   On: 10/26/2023 15:19   CT ABDOMEN PELVIS W CONTRAST Result Date: 10/25/2023 CLINICAL DATA:  Epigastric pain. History of gastric cancer. Assess treatment response. Concern for possible carcinomatosis. EXAM: CT ABDOMEN AND PELVIS WITH CONTRAST TECHNIQUE: Multidetector CT imaging of the abdomen and pelvis was performed using the standard protocol following bolus administration of intravenous contrast. RADIATION DOSE REDUCTION: This exam was performed according to the departmental dose-optimization program which includes automated exposure control, adjustment of the mA and/or kV according to patient size and/or use of iterative reconstruction technique. CONTRAST:  OMNIPAQUE IOHEXOL 300 MG/ML  SOLN COMPARISON:  09/28/2023, 03/28/2023. FINDINGS: Lower chest: The heart is normal in size and there is a small pericardial effusion. Emphysematous changes are present in the lungs. Mild atelectasis or scarring is noted at the lung bases. Hepatobiliary: Scattered subcentimeter hypodensities are present in the liver common decreased in size from 03/28/2023 and not well seen on 09/28/2023. The gallbladder is surgically absent. No biliary ductal dilatation is seen. Pancreas: Unremarkable. No pancreatic ductal dilatation or surrounding inflammatory changes. Spleen: Normal in size without focal abnormality. Adrenals/Urinary Tract: The adrenal glands are within normal limits. The kidneys enhance symmetrically. Subcentimeter hypodensities are present in the kidneys bilaterally which are too small to  further characterize. Renal cortical scarring is present on the right. No renal calculus or hydronephrosis bilaterally. No obvious bladder abnormality is seen, however examination is limited due to hardware artifact. Stomach/Bowel: The stomach is distended with fluid. Gastric wall thickening is noted about the antrum and proximal duodenum. A focally dilated loop of jejunum is noted in the mid left abdomen measuring up to 3.6 cm, with no obvious obstruction. Contrast is identified in the colon. There is thickening of the walls of the mid transverse colon. The appendix is not seen. Vascular/Lymphatic: Aortic atherosclerosis. No enlarged abdominal or pelvic lymph nodes. Reproductive: Prostate is unremarkable. Other: There is moderate-to-large ascites with peritoneal enhancement and omental caking in the upper abdomen, concerning for peritoneal carcinomatosis. Musculoskeletal: Total hip arthroplasty changes are present on the left. Degenerative changes are present in the thoracolumbar spine. A pars defect is noted  at L5 on the left. No acute osseous abnormality is seen. IMPRESSION: 1. Focally dilated loop of jejunum in the left upper quadrant without obvious evidence of obstruction. Findings may represent possible ileus. 2. A focal thickening of the walls of the mid transverse colon, which may be reactive versus other infectious or inflammatory process. 3. Bowel wall thickening involving the gastric antrum and pylorus, similar in appearance to the prior exam. 4. Moderate-to-large ascites with peritoneal enhancement and omental caking, suspicious for peritoneal carcinomatosis. 5. Aortic atherosclerosis. 6. Remaining incidental findings as described above. Electronically Signed   By: Thornell Sartorius M.D.   On: 10/25/2023 20:53   US Abdomen Limited Result Date: 10/24/2023 CLINICAL DATA:  Ascites EXAM: LIMITED ABDOMEN ULTRASOUND FOR ASCITES TECHNIQUE: Limited ultrasound survey for ascites was performed in all four  abdominal quadrants. COMPARISON:  09/28/2023 FINDINGS: Moderate ascites identified particularly right hemiabdomen and left lower quadrant. IMPRESSION: Moderate diffuse ascites. Electronically Signed   By: Karen Kays M.D.   On: 10/24/2023 13:24   DG Abd 1 View Result Date: 10/23/2023 CLINICAL DATA:  Nausea, vomiting EXAM: ABDOMEN - 1 VIEW COMPARISON:  None Available. FINDINGS: The bowel gas pattern is normal. No radio-opaque calculi or other significant radiographic abnormality are seen. Moderate stool burden throughout the colon. IMPRESSION: Moderate stool burden.  No acute findings. Electronically Signed   By: Charlett Nose M.D.   On: 10/23/2023 22:38   IR Paracentesis Result Date: 10/20/2023 INDICATION: Patient with history of gastric cancer, recurrent ascites. Request for diagnostic and therapeutic paracentesis. EXAM: ULTRASOUND GUIDED DIAGNOSTIC AND THERAPEUTIC PARACENTESIS MEDICATIONS: 7 mL 1% lidocaine. COMPLICATIONS: None immediate. PROCEDURE: Informed written consent was obtained from the patient after a discussion of the risks, benefits and alternatives to treatment. A timeout was performed prior to the initiation of the procedure. Initial ultrasound scanning demonstrates a large amount of ascites within the right lower abdominal quadrant. The right lower abdomen was prepped and draped in the usual sterile fashion. 1% lidocaine was used for local anesthesia. Following this, a 19 gauge, 7-cm, Yueh catheter was introduced. An ultrasound image was saved for documentation purposes. The paracentesis was performed. The catheter was removed and a dressing was applied. The patient tolerated the procedure well without immediate post procedural complication. FINDINGS: A total of approximately 4.6 L of clear yellow fluid was removed. Samples were sent to the laboratory as requested by the clinical team. IMPRESSION: Successful ultrasound-guided paracentesis yielding 4.6 liters of peritoneal fluid. Performed by  Lynnette Caffey, PA-C Electronically Signed   By: Irish Lack M.D.   On: 10/20/2023 13:20   CT ABDOMEN PELVIS W CONTRAST Result Date: 09/28/2023 CLINICAL DATA:  Staging gastric carcinoma metastatic carcinoma. * Tracking Code: BO * insert EXAM: CT ABDOMEN AND PELVIS WITH CONTRAST TECHNIQUE: Multidetector CT imaging of the abdomen and pelvis was performed using the standard protocol following bolus administration of intravenous contrast. RADIATION DOSE REDUCTION: This exam was performed according to the departmental dose-optimization program which includes automated exposure control, adjustment of the mA and/or kV according to patient size and/or use of iterative reconstruction technique. CONTRAST:  OMNIPAQUE IOHEXOL 300 MG/ML  SOLN COMPARISON:  CT 09/15/2022 FINDINGS: Lower chest: Lung bases are clear. Hepatobiliary: No focal hepatic lesions. Scattered hypodensities are less conspicuous. Single hypodensity remains along the posterior aspect of the RIGHT hepatic lobe measuring less than 1 cm on image 31/series 2 Postcholecystectomy. Large volume intraperitoneal free fluid surrounds the liver increased from prior. Pancreas: Pancreas is normal. No ductal dilatation. No pancreatic inflammation.  Spleen: Normal spleen Adrenals/urinary tract: Adrenal glands normal. RIGHT renal cortical scarring. Ureters and bladder normal. Stomach/Bowel: Stomach appears normal. A mild nonspecific thickening through the pyloric region. Duodenum and small-bowel normal. Small bowel flow centrally within large volume ascites. The colon and rectosigmoid colon are normal. Vascular/Lymphatic: Abdominal aorta is normal caliber. No periportal or retroperitoneal adenopathy. No pelvic adenopathy. Reproductive: Unremarkable Other: Large volume intraperitoneal free fluid. This large volume of fluid limits evaluation of the omentum and peritoneum. No clear evidence of peritoneal nodularity. Musculoskeletal: No aggressive osseous lesion.  IMPRESSION: 1. Large volume intraperitoneal free fluid increased from prior. 2. No clear evidence of peritoneal carcinomatosis. Difficult to assess omentum with large volume ascites. 3. Decreased conspicuity of hepatic hypodensities. Single hypodensity remains along the posterior aspect of the RIGHT hepatic lobe. 4. Mild nonspecific thickening of the pyloric region of the stomach. 5. RIGHT renal cortical scarring. Electronically Signed   By: Genevive Bi M.D.   On: 09/28/2023 15:16   US Paracentesis Result Date: 09/28/2023 INDICATION: Patient with history of stage IV gastric cancer, recurrent ascites. Request received for therapeutic paracentesis. EXAM: ULTRASOUND GUIDED THERAPEUTIC PARACENTESIS MEDICATIONS: 8 mL 1% lidocaine COMPLICATIONS: None immediate. PROCEDURE: Informed written consent was obtained from the patient after a discussion of the risks, benefits and alternatives to treatment. A timeout was performed prior to the initiation of the procedure. Initial ultrasound scanning demonstrates a large amount of ascites within the right mid to lower abdominal quadrant. The right mid to lower abdomen was prepped and draped in the usual sterile fashion. 1% lidocaine was used for local anesthesia. Following this, a 19 gauge, 10-cm, Yueh catheter was introduced. An ultrasound image was saved for documentation purposes. The paracentesis was performed. The catheter was removed and a dressing was applied. The patient tolerated the procedure well without immediate post procedural complication. FINDINGS: A total of approximately 5 liters of yellow fluid was removed. IMPRESSION: Successful ultrasound-guided therapeutic paracentesis yielding 5 liters of peritoneal fluid. Performed by: Artemio Aly Electronically Signed   By: Richarda Overlie M.D.   On: 09/28/2023 12:30  Mr. McClendon was interviewed and examined.  He reports an episode of emesis early this morning after eating roast beef last night.  He reports  adequate pain control. He is now at day 10 following the most recent cycle of FOLFOX/nivolumab.  He received G-CSF following chemotherapy.  He has developed severe neutropenia.  I expect the white count to recover over the next few days.  He should begin broad-spectrum intravenous antibiotics for a fever spike.  I reviewed the case with Dr. Leone Payor including options for management of intractable vomiting.  Dr. Leone Payor plans to proceed with a diagnostic endoscopy and stent placement as indicated.  I began a discussion regarding status and goals of care.  He indicated he wishes to continue treatment of the cancer. Recommendations: Daily CBC with differential Blood cultures and broad-spectrum intravenous antibiotics for a fever greater than 100.5 Diet/evaluation/management of intractable vomiting per GI Continue goals of care discussion, ACP documents per the medical and palliative care teams Continue heparin, resume apixaban when okay with gastroenterology Please call oncology over the weekend as needed.  I will check on him 10/30/2023.

## 2023-10-27 NOTE — Progress Notes (Signed)
 PHARMACY - ANTICOAGULATION CONSULT NOTE  Pharmacy Consult for heparin Indication:  h/o renal infarct and h/o DVT  (PTA Eliquis on hold)  Allergies  Allergen Reactions   Pravastatin Itching   Simvastatin Itching    Patient Measurements: Height: 6\' 1"  (185.4 cm) Weight: 57.7 kg (127 lb 1.6 oz) IBW/kg (Calculated) : 79.9 Heparin Dosing Weight: 57.6 kg  Vital Signs: Temp: 99 F (37.2 C) (03/21 0432) BP: 105/74 (03/21 0432) Pulse Rate: 103 (03/21 0432)  Labs: Recent Labs    10/24/23 0537 10/25/23 0400 10/25/23 1230 10/25/23 1825 10/26/23 0202 10/26/23 1000 10/26/23 1800 10/27/23 0353  HGB 10.9* 10.4*  --   --  9.7*  --  10.5*  --   HCT 33.9* 32.1*  --   --  30.7*  --  32.8*  --   PLT 188 147*  --   --  127*  --  125*  --   APTT 97* 142* 99* 118* 97*  --   --   --   HEPARINUNFRC >1.10*  --  0.79*  --  0.42 0.35  --  0.28*  CREATININE 0.65 0.63  --   --  0.55*  --   --   --     Estimated Creatinine Clearance: 80.1 mL/min (A) (by C-G formula based on SCr of 0.55 mg/dL (L)).   Medical History: Past Medical History:  Diagnosis Date   Chest pain    stres test neg x 2, cath 5/12; minimal LAD irregs; no obs CAD; normal LVF   Clotting disorder (HCC)    dvt   Deep vein thrombosis (HCC) 10/06/2010   GERD (gastroesophageal reflux disease)    History of DVT of lower extremity    on chronic coumadin   Hypertension    Internal hemorrhoids    Cscope 2007   PAD (peripheral artery disease) (HCC)    a. left leg ischemia tx wtih embolectomy of left fem, pop, tib arteries with Dr. Edilia Bo - 08/2006;  b. known occlusion of right pop with collats - med Rx;  c.ABI's 8/09: R 1.0; L 0.91    PFO (patent foramen ovale)    per 08/2006 discharge summary, TEE showed EF 60%, small PFO with minimal right-to-left shunt with Valsalva; not felt to be the source of emboli, lifelong Coumadin recommended   Pneumonia    history of   Polysubstance abuse (HCC)    marijuana only   Renal infarct  [facility])    R    Assessment: Patient is a 63 y.o. male with hx HTN, PAD, renal infarct, stage IV gastric cancer on chemo, and LE DVT in 2008 on Eliquis PTA who presented to the ED on 10/23/23 with c/o severe nausea and vomiting. Pt received last dose (cycle 5) of FOLFOX/nivolumab on 10/18/23. He reported nausea with recurrent bouts of vomiting since 10/20/23, which couldn't be improved by Protonix, Carafate, and [name] at home. Anticoagulation changed to heparin due to poor oral intake secondary to nausea and vomiting and in case invasive intervention is needed.  - Last dose Eliquis was taken on 3/17 at 10am.   Today, 10/25/23: - Heparin level 0.28 slightly subtherapeutic on 800 units/hr - CBC IP - No complications of therapy noted  Goal of Therapy:  Heparin level 0.3-0.7 units/ml aPTT 66-102 seconds Monitor platelets by anticoagulation protocol: Yes   Plan:  - increase heparin drip to 950 units/hr - heparin level in 6 hours -- Daily CBC& heparin level - Monitor for s/sx bleeding   Arley Phenix  RPh 10/27/2023, 4:55 AM

## 2023-10-27 NOTE — Plan of Care (Signed)

## 2023-10-27 NOTE — Progress Notes (Signed)
 Pharmacy: [facility]  Patient is a 63 y.o M who is currently on heparin drip for hx renal infarct and DVT while PTA Eliquis is on hold.   - confirmatory heparin level collected at ~6p is therapeutic at 0.44 - no bleeding documented   Goal of Therapy:  Heparin level 0.3-0.7 units/ml aPTT 66-102 seconds Monitor platelets by anticoagulation protocol: Yes  Plan: - continue heparin drip at 950 units/hr - daily heparin level - monitor for s/sx bleeding  Dorna Leitz, PharmD, BCPS 10/27/2023 6:53 PM

## 2023-10-27 NOTE — Progress Notes (Signed)
 Nutrition Follow-up  DOCUMENTATION CODES:   Severe malnutrition in context of chronic illness, Underweight  INTERVENTION:   -Continue to recommend placement of feeding tube, as pt is unable to meet nutritional needs -Will be at refeeding risk once nutrition increases  -If diet continues, Ensure or Boost Breeze which ever pt can tolerate best  NUTRITION DIAGNOSIS:   Severe Malnutrition related to chronic illness, cancer and cancer related treatments as evidenced by severe fat depletion, severe muscle depletion, energy intake < 75% for > or equal to 1 month, percent weight loss.  Ongoing.  GOAL:   Patient will meet greater than or equal to 90% of their needs  Not meeting.  MONITOR:   PO intake, Supplement acceptance  REASON FOR ASSESSMENT:   Consult Assessment of nutrition requirement/status  ASSESSMENT:   63 y.o. male with medical history significant for hypertension, PAD, DVT on Eliquis, and gastric cancer undergoing treatment with FOLFOX/nivolumab who presents with severe nausea and vomiting.  Patient continues to struggle with tolerance of PO. Had mashed potatoes and roast beef last night and ended up vomiting early this morning.  GI now planning EGD 3/22. Per oncology, symptoms are likely related to GOO or carcinomatosis and not chemo.  If after stent placed, symptoms continue, recommend  feeding tube placement. Pt has had sub-optimal nutrition since January.  Admission weight: 127 lbs  Medications: Reglan, Zofran  Labs reviewed: Low Na   Diet Order:   Diet Order             Diet NPO time specified Except for: Ice Chips, Sips with Meds  Diet effective midnight           DIET SOFT Room service appropriate? Yes; Fluid consistency: Thin  Diet effective now                   EDUCATION NEEDS:   No education needs have been identified at this time  Skin:  Skin Assessment: Reviewed RN Assessment  Last BM:  3/19  Height:   Ht Readings from Last 1  Encounters:  10/23/23 6\' 1"  (1.854 m)    Weight:   Wt Readings from Last 1 Encounters:  10/25/23 57.7 kg    BMI:  Body mass index is 16.77 kg/m.  Estimated Nutritional Needs:   Kcal:  2300-2500  Protein:  115-125g  Fluid:  2.3L/day   Tilda Franco, MS, RD, LDN Inpatient Clinical Dietitian Contact via Secure chat

## 2023-10-27 NOTE — Plan of Care (Signed)

## 2023-10-28 ENCOUNTER — Encounter (HOSPITAL_COMMUNITY)
Admission: EM | Disposition: A | Discharge status: Home / Self Care | Payer: Self-pay | Source: Ambulatory Visit | Attending: Family Medicine

## 2023-10-28 DIAGNOSIS — R1110 Vomiting, unspecified: Secondary | ICD-10-CM

## 2023-10-28 DIAGNOSIS — R188 Other ascites: Secondary | ICD-10-CM | POA: Diagnosis not present

## 2023-10-28 DIAGNOSIS — R112 Nausea with vomiting, unspecified: Secondary | ICD-10-CM | POA: Diagnosis not present

## 2023-10-28 DIAGNOSIS — C169 Malignant neoplasm of stomach, unspecified: Secondary | ICD-10-CM | POA: Diagnosis not present

## 2023-10-28 DIAGNOSIS — C800 Disseminated malignant neoplasm, unspecified: Secondary | ICD-10-CM | POA: Diagnosis not present

## 2023-10-28 DIAGNOSIS — E860 Dehydration: Secondary | ICD-10-CM

## 2023-10-28 DIAGNOSIS — Z86718 Personal history of other venous thrombosis and embolism: Secondary | ICD-10-CM | POA: Diagnosis not present

## 2023-10-28 LAB — CBC WITH DIFFERENTIAL/PLATELET
Abs Immature Granulocytes: 0.03 10*3/uL (ref 0.00–0.07)
Basophils Absolute: 0 10*3/uL (ref 0.0–0.1)
Basophils Relative: 1 %
Eosinophils Absolute: 0 10*3/uL (ref 0.0–0.5)
Eosinophils Relative: 2 %
HCT: 30.6 % — ABNORMAL LOW (ref 39.0–52.0)
Hemoglobin: 9.9 g/dL — ABNORMAL LOW (ref 13.0–17.0)
Immature Granulocytes: 3 %
Lymphocytes Relative: 62 %
Lymphs Abs: 0.7 10*3/uL (ref 0.7–4.0)
MCH: 28.9 pg (ref 26.0–34.0)
MCHC: 32.4 g/dL (ref 30.0–36.0)
MCV: 89.5 fL (ref 80.0–100.0)
Monocytes Absolute: 0.3 10*3/uL (ref 0.1–1.0)
Monocytes Relative: 26 %
Neutro Abs: 0.1 10*3/uL — CL (ref 1.7–7.7)
Neutrophils Relative %: 6 %
Platelets: 112 10*3/uL — ABNORMAL LOW (ref 150–400)
RBC: 3.42 MIL/uL — ABNORMAL LOW (ref 4.22–5.81)
RDW: 15.3 % (ref 11.5–15.5)
WBC: 1.1 10*3/uL — CL (ref 4.0–10.5)
nRBC: 0 % (ref 0.0–0.2)

## 2023-10-28 LAB — URINALYSIS, ROUTINE W REFLEX MICROSCOPIC
Bacteria, UA: NONE SEEN
Bilirubin Urine: NEGATIVE
Glucose, UA: NEGATIVE mg/dL
Hgb urine dipstick: NEGATIVE
Ketones, ur: 20 mg/dL — AB
Leukocytes,Ua: NEGATIVE
Nitrite: NEGATIVE
Protein, ur: 30 mg/dL — AB
Specific Gravity, Urine: 1.027 (ref 1.005–1.030)
pH: 6 (ref 5.0–8.0)

## 2023-10-28 LAB — HEPARIN LEVEL (UNFRACTIONATED): Heparin Unfractionated: 0.33 [IU]/mL (ref 0.30–0.70)

## 2023-10-28 SURGERY — CANCELLED PROCEDURE

## 2023-10-28 MED ORDER — ALUM & MAG HYDROXIDE-SIMETH 200-200-20 MG/5ML PO SUSP
30.0000 mL | Freq: Four times a day (QID) | ORAL | Status: DC | PRN
  Administered 2023-10-28 – 2023-10-30 (×5): 30 mL via ORAL
  Filled 2023-10-28 (×5): qty 30

## 2023-10-28 NOTE — Plan of Care (Signed)

## 2023-10-28 NOTE — Progress Notes (Signed)
 [facility]  PROGRESS NOTE  FODAY CONE ZOX:[id number] DOB: 07/02/1961 DOA: 10/23/2023 PCP: Wanda Plump, MD   Brief HPI:   63 year old male with gastric cancer who is undergoing chemotherapy. He has chronic issues with nausea vomiting and abdominal pain however, he feels worse over the past few days. He states that he is having episodes of sudden vomiting and has been unable to tolerate liquids and solids.     Assessment/Plan:     Metastatic Gastric Cancer  Intractable Nausea, Vomiting, and Abdominal Pain  Malignant Ascites Appreciate GI assistance Appreciate oncology assistance CT scan with possible ileus (focally dilatedloop of jejunum in LUQ without obvious evidence of obstruction).  Focal thickening of walls of transverse colon (reactive vs other infectious/inflammatory process).  Bowel thickening involving the gastric antrum and pyloris.  Moderate to large ascites with peritoneal enhancement and omental caking. Lipase wnl Repeat paracentesis 3/20, total cell count 473, neutrophils 3%    Poor PO intake Appreciate GI input -Plan for EGD in a.m. to see if the anatomy of could allow stent placement -EGD on hold due to febrile neutropenia. -EGD only when ANC greater than 1000 as per GI   History DVT Currently on heparin gtt -Will check with GI when to stop heparin for EGD   Anemia Stable H/H, will trend   Thombocytopenia  Insetting of pancytopenia due to chemotherapy  Febrile neutropenia -Chemotherapy-induced -WBC count 1.1, neutrophil 6% -Had low-grade fever on 3/21 -Started on empiric vancomycin, cefepime, Flagyl  -Chest x-ray showed no acute disease -Blood culture negative to date -UA was clear -Oncology following   Medications     Chlorhexidine Gluconate Cloth  6 each Topical Daily   feeding supplement  1 Container Oral TID BM   feeding supplement  237 mL Oral BID BM   metoCLOPramide (REGLAN) injection  10 mg Intravenous Q6H   nicotine  21 mg  Transdermal Q24H   pantoprazole (PROTONIX) IV  40 mg Intravenous QHS   scopolamine  1 patch Transdermal Q72H   sodium chloride flush  3 mL Intravenous Q12H   SMOG  400 mL Rectal Once     Data Reviewed:   CBG:  No results for input(s): "GLUCAP" in the last 168 hours.  SpO2: 99 %    Vitals:   10/27/23 0432 10/27/23 1339 10/27/23 2025 10/28/23 0520  BP: 105/74 92/72 105/77 109/81  Pulse: (!) 103 100 98 (!) 103  Resp: 18 18 16 16   Temp: 99 F (37.2 C) 99.4 F (37.4 C) 98.1 F (36.7 C) 97.9 F (36.6 C)  TempSrc:   Oral Oral  SpO2: 100% 100% 100% 99%  Weight:      Height:          Data Reviewed:  Basic Metabolic Panel: Recent Labs  Lab 10/23/23 1603 10/24/23 0537 10/25/23 0400 10/26/23 0202 10/27/23 0353  NA 133* 130* 129* 131* 131*  K 3.0* 3.3* 3.1* 4.0 3.8  CL 92* 95* 98 100 100  CO2 29 25 25 22  21*  GLUCOSE 118* 80 75 72 75  BUN 14 11 8 8 8   CREATININE 0.77 0.65 0.63 0.55* 0.65  CALCIUM 8.9 8.4* 8.0* 8.0* 8.3*  MG 2.1  --  2.3  --   --   PHOS 3.3  --   --   --   --     CBC: Recent Labs  Lab 10/23/23 1603 10/24/23 0537 10/25/23 0400 10/26/23 0202 10/26/23 1800 10/27/23 0353 10/28/23 0235  WBC 35.9*   < >  9.3 3.5* 1.9* 1.1* 1.1*  NEUTROABS 34.5*  --   --   --  0.6* 0.3* 0.1*  HGB 13.3   < > 10.4* 9.7* 10.5* 9.7* 9.9*  HCT 38.9*   < > 32.1* 30.7* 32.8* 31.1* 30.6*  MCV 87.8   < > 89.9 89.5 89.1 90.1 89.5  PLT 253   < > 147* 127* 125* 113* 112*   < > = values in this interval not displayed.    LFT Recent Labs  Lab 10/23/23 1603 10/24/23 0537  AST 24 21  ALT 10 8  ALKPHOS 169* 117  BILITOT 0.8 0.8  PROT 5.8* 5.0*  ALBUMIN 2.6* 2.3*     Antibiotics: Anti-infectives (From admission, onward)    Start     Dose/Rate Route Frequency Ordered Stop   10/27/23 2200  vancomycin (VANCOREADY) IVPB 750 mg/150 mL        750 mg 150 mL/hr over 60 Minutes Intravenous Every 12 hours 10/27/23 1224     10/27/23 2200  ceFEPIme (MAXIPIME) 2 g in sodium  chloride 0.9 % 100 mL IVPB        2 g 200 mL/hr over 30 Minutes Intravenous Every 8 hours 10/27/23 1224     10/27/23 1830  metroNIDAZOLE (FLAGYL) IVPB 500 mg        500 mg 100 mL/hr over 60 Minutes Intravenous Every 12 hours 10/27/23 1809     10/27/23 1300  vancomycin (VANCOCIN) IVPB 1000 mg/200 mL premix        1,000 mg 200 mL/hr over 60 Minutes Intravenous  Once 10/27/23 1208 10/27/23 1440   10/27/23 1300  ceFEPIme (MAXIPIME) 2 g in sodium chloride 0.9 % 100 mL IVPB        2 g 200 mL/hr over 30 Minutes Intravenous  Once 10/27/23 1208 10/27/23 1454        DVT prophylaxis: Full dose heparin  Code Status: Full code  Family Communication: Discussed with patient's wife at bedside   CONSULTS    Subjective   Continues to have vomiting.   Objective    Physical Examination:  Appears in no acute distress S1-S2, regular Abdomen is soft, nontender Extremities no edema   Status is: Inpatient:             Meredeth Ide   [facility] If 7PM-7AM, please contact night-coverage at [email or web address], Office  [phone]   10/28/2023, 10:49 AM  LOS: 3 days

## 2023-10-28 NOTE — Plan of Care (Signed)
  Problem: Education: Goal: Knowledge of General Education information will improve Description: Including pain rating scale, medication(s)/side effects and non-pharmacologic comfort measures Outcome: Progressing   Problem: Health Behavior/Discharge Planning: Goal: Ability to manage health-related needs will improve Outcome: Progressing   Problem: Clinical Measurements: Goal: Diagnostic test results will improve Outcome: Progressing   Problem: Activity: Goal: Risk for activity intolerance will decrease Outcome: Progressing   Problem: Nutrition: Goal: Adequate nutrition will be maintained Outcome: Not Progressing

## 2023-10-28 NOTE — Progress Notes (Addendum)
   Patient Name: EUSEBIO BLAZEJEWSKI Date of Encounter: 10/28/2023, 2:46 PM     Assessment and Plan  Metastatic gastric cancer w/ carcinomatosis and ascites  Persistent vomiting ? gastric outlet obstruction vs dysfunctional stomach related to tumor or ascites  - did not respond to most recent paracentesis 1.2 L 3/20  Febrile neutropenia (did receive G-CSF) - Dr. Truett Perna expects counts to recover  Anticoagulant therapy (currently heparin) for DVT ----------------------------------------------------------------------------------------------------------------------  Continue supportive care at this time. Neutropenia limits ability to do EGD - once ANC >1500 will consider EGD to see if anatomy shows stricture that could be stented to relieve gastric outlet obstruction symptoms.  Would need to hold anti-coagulant Ascites and carcinomatosis prevents gastrostomy      Iva Boop, MD, [facility] See Loretha Stapler on call - gastroenterology for best contact person 10/28/2023 2:55 PM   Subjective  On clear liquids but persistent regurgitation/vomiting   Objective  BP 98/71 (BP Location: Left Arm)   Pulse 99   Temp 98.1 F (36.7 C) (Oral)   Resp 18   Ht 6\' 1"  (1.854 m)   Wt 57.7 kg   SpO2 99%   BMI 16.77 kg/m  Chronically ill NAD Abd soft NT, not sig distended      Latest Ref Rng & Units 10/28/2023    2:35 AM 10/27/2023    3:53 AM 10/26/2023    6:00 PM  CBC  WBC 4.0 - 10.5 K/uL 1.1  1.1  1.9   Hemoglobin 13.0 - 17.0 g/dL 9.9  9.7  60.4   Hematocrit 39.0 - 52.0 % 30.6  31.1  32.8   Platelets 150 - 400 K/uL 112  113  125    Recent Labs  Lab 10/23/23 1603 10/24/23 0537 10/25/23 0400 10/26/23 0202 10/27/23 0353  NA 133* 130* 129* 131* 131*  K 3.0* 3.3* 3.1* 4.0 3.8  CL 92* 95* 98 100 100  CO2 29 25 25 22  21*  GLUCOSE 118* 80 75 72 75  BUN 14 11 8 8 8   CREATININE 0.77 0.65 0.63 0.55* 0.65  CALCIUM 8.9 8.4* 8.0* 8.0* 8.3*  MG 2.1  --  2.3  --   --    PHOS 3.3  --   --   --   --        CT abd pelvis 3/19 - images reviewed   Stomach/Bowel: The stomach is distended with fluid. Gastric wall thickening is noted about the antrum and proximal duodenum. A focally dilated loop of jejunum is noted in the mid left abdomen measuring up to 3.6 cm, with no obvious obstruction. Contrast is identified in the colon. There is thickening of the walls of the mid transverse colon. The appendix is not seen   IMPRESSION: 1. Focally dilated loop of jejunum in the left upper quadrant without obvious evidence of obstruction. Findings may represent possible ileus. 2. A focal thickening of the walls of the mid transverse colon, which may be reactive versus other infectious or inflammatory process. 3. Bowel wall thickening involving the gastric antrum and pylorus, similar in appearance to the prior exam. 4. Moderate-to-large ascites with peritoneal enhancement and omental caking, suspicious for peritoneal carcinomatosis. 5. Aortic atherosclerosis. 6. Remaining incidental findings as described above.  Iva Boop, MD, [facility] See Loretha Stapler on call - gastroenterology for best contact person 10/28/2023 2:46 PM

## 2023-10-28 NOTE — Progress Notes (Signed)
 PHARMACY - ANTICOAGULATION CONSULT NOTE  Pharmacy Consult for heparin Indication:  h/o renal infarct and h/o DVT  (PTA Eliquis on hold)  Allergies  Allergen Reactions   Pravastatin Itching   Simvastatin Itching    Patient Measurements: Height: 6\' 1"  (185.4 cm) Weight: 57.7 kg (127 lb 1.6 oz) IBW/kg (Calculated) : 79.9 Heparin Dosing Weight: 57.6 kg  Vital Signs: Temp: 98.1 F (36.7 C) (03/21 2025) Temp Source: Oral (03/21 2025) BP: 105/77 (03/21 2025) Pulse Rate: 98 (03/21 2025)  Labs: Recent Labs    10/25/23 0400 10/25/23 0400 10/25/23 1230 10/25/23 1825 10/26/23 0202 10/26/23 1000 10/26/23 1800 10/27/23 0353 10/27/23 1130 10/27/23 1755 10/28/23 0235  HGB 10.4*  --   --   --  9.7*  --  10.5* 9.7*  --   --  9.9*  HCT 32.1*  --   --   --  30.7*  --  32.8* 31.1*  --   --  30.6*  PLT 147*  --   --   --  127*  --  125* 113*  --   --  112*  APTT 142*  --  99* 118* 97*  --   --   --   --   --   --   HEPARINUNFRC  --    < > 0.79*  --  0.42   < >  --  0.28* 0.35 0.44 0.33  CREATININE 0.63  --   --   --  0.55*  --   --  0.65  --   --   --    < > = values in this interval not displayed.    Estimated Creatinine Clearance: 80.1 mL/min (by C-G formula based on SCr of 0.65 mg/dL).   Medical History: Past Medical History:  Diagnosis Date   Chest pain    stres test neg x 2, cath 5/12; minimal LAD irregs; no obs CAD; normal LVF   Clotting disorder (HCC)    dvt   Deep vein thrombosis (HCC) 10/06/2010   GERD (gastroesophageal reflux disease)    History of DVT of lower extremity    on chronic coumadin   Hypertension    Internal hemorrhoids    Cscope 2007   PAD (peripheral artery disease) (HCC)    a. left leg ischemia tx wtih embolectomy of left fem, pop, tib arteries with Dr. Edilia Bo - 08/2006;  b. known occlusion of right pop with collats - med Rx;  c.ABI's 8/09: R 1.0; L 0.91    PFO (patent foramen ovale)    per 08/2006 discharge summary, TEE showed EF 60%, small PFO  with minimal right-to-left shunt with Valsalva; not felt to be the source of emboli, lifelong Coumadin recommended   Pneumonia    history of   Polysubstance abuse (HCC)    marijuana only   Renal infarct [facility])    R    Assessment: Patient is a 63 y.o. male with hx HTN, PAD, renal infarct, stage IV gastric cancer on chemo, and LE DVT in 2008 on Eliquis PTA who presented to the ED on 10/23/23 with c/o severe nausea and vomiting. Pt received last dose (cycle 5) of FOLFOX/nivolumab on 10/18/23. He reported nausea with recurrent bouts of vomiting since 10/20/23, which couldn't be improved by Protonix, Carafate, and [name] at home. Anticoagulation changed to heparin due to poor oral intake secondary to nausea and vomiting and in case invasive intervention is needed.  - Last dose Eliquis was taken on 3/17 at 10am.  Today, 10/25/23: - Heparin level 0.33, therapeutic on 950 units/hr - Hgb 9.9, plts 112 - No complications of therapy noted  Goal of Therapy:  Heparin level 0.3-0.7 units/ml aPTT 66-102 seconds Monitor platelets by anticoagulation protocol: Yes   Plan:  - continue heparin drip at 950 units/hr -- Daily CBC& heparin level - Monitor for s/sx bleeding   Arley Phenix RPh 10/28/2023, 3:51 AM

## 2023-10-29 ENCOUNTER — Other Ambulatory Visit: Payer: Self-pay | Admitting: Oncology

## 2023-10-29 DIAGNOSIS — C169 Malignant neoplasm of stomach, unspecified: Secondary | ICD-10-CM | POA: Diagnosis not present

## 2023-10-29 DIAGNOSIS — C800 Disseminated malignant neoplasm, unspecified: Secondary | ICD-10-CM | POA: Diagnosis not present

## 2023-10-29 DIAGNOSIS — R112 Nausea with vomiting, unspecified: Secondary | ICD-10-CM | POA: Diagnosis not present

## 2023-10-29 DIAGNOSIS — R1110 Vomiting, unspecified: Secondary | ICD-10-CM | POA: Diagnosis not present

## 2023-10-29 DIAGNOSIS — R188 Other ascites: Secondary | ICD-10-CM | POA: Diagnosis not present

## 2023-10-29 LAB — COMPREHENSIVE METABOLIC PANEL
ALT: 9 U/L (ref 0–44)
AST: 11 U/L — ABNORMAL LOW (ref 15–41)
Albumin: 2 g/dL — ABNORMAL LOW (ref 3.5–5.0)
Alkaline Phosphatase: 60 U/L (ref 38–126)
Anion gap: 9 (ref 5–15)
BUN: 8 mg/dL (ref 6–20)
CO2: 20 mmol/L — ABNORMAL LOW (ref 22–32)
Calcium: 8.3 mg/dL — ABNORMAL LOW (ref 8.9–10.3)
Chloride: 101 mmol/L (ref 98–111)
Creatinine, Ser: 0.71 mg/dL (ref 0.61–1.24)
GFR, Estimated: >60 mL/min (ref >60)
Glucose, Bld: 78 mg/dL (ref 70–99)
Potassium: 3.7 mmol/L (ref 3.5–5.1)
Sodium: 130 mmol/L — ABNORMAL LOW (ref 135–145)
Total Bilirubin: 0.9 mg/dL (ref 0.0–1.2)
Total Protein: 4.9 g/dL — ABNORMAL LOW (ref 6.5–8.1)

## 2023-10-29 LAB — CBC WITH DIFFERENTIAL/PLATELET
Abs Immature Granulocytes: 0.01 10*3/uL (ref 0.00–0.07)
Basophils Absolute: 0 10*3/uL (ref 0.0–0.1)
Basophils Relative: 2 %
Eosinophils Absolute: 0 10*3/uL (ref 0.0–0.5)
Eosinophils Relative: 1 %
HCT: 30.7 % — ABNORMAL LOW (ref 39.0–52.0)
Hemoglobin: 9.8 g/dL — ABNORMAL LOW (ref 13.0–17.0)
Immature Granulocytes: 1 %
Lymphocytes Relative: 49 %
Lymphs Abs: 0.7 10*3/uL (ref 0.7–4.0)
MCH: 28.1 pg (ref 26.0–34.0)
MCHC: 31.9 g/dL (ref 30.0–36.0)
MCV: 88 fL (ref 80.0–100.0)
Monocytes Absolute: 0.5 10*3/uL (ref 0.1–1.0)
Monocytes Relative: 37 %
Neutro Abs: 0.1 10*3/uL — CL (ref 1.7–7.7)
Neutrophils Relative %: 10 %
Platelets: 130 10*3/uL — ABNORMAL LOW (ref 150–400)
RBC: 3.49 MIL/uL — ABNORMAL LOW (ref 4.22–5.81)
RDW: 15.6 % — ABNORMAL HIGH (ref 11.5–15.5)
WBC: 1.4 10*3/uL — CL (ref 4.0–10.5)
nRBC: 0 % (ref 0.0–0.2)

## 2023-10-29 LAB — HEPARIN LEVEL (UNFRACTIONATED)
Heparin Unfractionated: 0.19 [IU]/mL — ABNORMAL LOW (ref 0.30–0.70)
Heparin Unfractionated: 0.46 [IU]/mL (ref 0.30–0.70)
Heparin Unfractionated: 0.51 [IU]/mL (ref 0.30–0.70)

## 2023-10-29 NOTE — Progress Notes (Signed)
 PHARMACY - ANTICOAGULATION CONSULT NOTE  Pharmacy Consult for heparin Indication:  h/o renal infarct and h/o DVT  (PTA Eliquis on hold)  Allergies  Allergen Reactions   Pravastatin Itching   Simvastatin Itching    Patient Measurements: Height: 6\' 1"  (185.4 cm) Weight: 57.7 kg (127 lb 1.6 oz) IBW/kg (Calculated) : 79.9 Heparin Dosing Weight: 57.6 kg  Vital Signs: Temp: 98.3 F (36.8 C) (03/23 0258) Temp Source: Oral (03/23 0258) BP: 103/78 (03/23 0258) Pulse Rate: 109 (03/23 0258)  Labs: Recent Labs    10/27/23 0353 10/27/23 1130 10/27/23 1755 10/28/23 0235 10/29/23 0403  HGB 9.7*  --   --  9.9* 9.8*  HCT 31.1*  --   --  30.6* 30.7*  PLT 113*  --   --  112* 130*  HEPARINUNFRC 0.28*   < > 0.44 0.33 0.19*  CREATININE 0.65  --   --   --  0.71   < > = values in this interval not displayed.    Estimated Creatinine Clearance: 80.1 mL/min (by C-G formula based on SCr of 0.71 mg/dL).   Medical History: Past Medical History:  Diagnosis Date   Chest pain    stres test neg x 2, cath 5/12; minimal LAD irregs; no obs CAD; normal LVF   Clotting disorder (HCC)    dvt   Deep vein thrombosis (HCC) 10/06/2010   GERD (gastroesophageal reflux disease)    History of DVT of lower extremity    on chronic coumadin   Hypertension    Internal hemorrhoids    Cscope 2007   PAD (peripheral artery disease) (HCC)    a. left leg ischemia tx wtih embolectomy of left fem, pop, tib arteries with Dr. Edilia Bo - 08/2006;  b. known occlusion of right pop with collats - med Rx;  c.ABI's 8/09: R 1.0; L 0.91    PFO (patent foramen ovale)    per 08/2006 discharge summary, TEE showed EF 60%, small PFO with minimal right-to-left shunt with Valsalva; not felt to be the source of emboli, lifelong Coumadin recommended   Pneumonia    history of   Polysubstance abuse (HCC)    marijuana only   Renal infarct [facility])    R    Assessment: Patient is a 63 y.o. male with hx HTN, PAD, renal infarct, stage IV  gastric cancer on chemo, and LE DVT in 2008 on Eliquis PTA who presented to the ED on 10/23/23 with c/o severe nausea and vomiting. Pt received last dose (cycle 5) of FOLFOX/nivolumab on 10/18/23. He reported nausea with recurrent bouts of vomiting since 10/20/23, which couldn't be improved by Protonix, Carafate, and [name] at home. Anticoagulation changed to heparin due to poor oral intake secondary to nausea and vomiting and in case invasive intervention is needed.  - Last dose Eliquis was taken on 3/17 at 10am.   Today, 10/25/23: - Heparin level 0.19, subtherapeutic on 950 units/hr - Hgb 9.8, plts 130 - No complications of therapy noted  Goal of Therapy:  Heparin level 0.3-0.7 units/ml aPTT 66-102 seconds Monitor platelets by anticoagulation protocol: Yes   Plan:  - increase heparin drip to 1150 units/hr - heparin level in 6 hours -- Daily CBC& heparin level - Monitor for s/sx bleeding   Arley Phenix RPh 10/29/2023, 4:50 AM

## 2023-10-29 NOTE — Plan of Care (Signed)
  Problem: Clinical Measurements: Goal: Will remain free from infection Outcome: Progressing   Problem: Activity: Goal: Risk for activity intolerance will decrease Outcome: Progressing   Problem: Nutrition: Goal: Adequate nutrition will be maintained Outcome: Progressing   Problem: Elimination: Goal: Will not experience complications related to bowel motility Outcome: Progressing   Problem: Pain Managment: Goal: General experience of comfort will improve and/or be controlled Outcome: Progressing   Problem: Safety: Goal: Ability to remain free from injury will improve Outcome: Progressing

## 2023-10-29 NOTE — Progress Notes (Signed)
 Patient Name: [name] Date of Encounter: 10/29/2023, 12:25 PM     Assessment and Plan  Metastatic gastric cancer w/ carcinomatosis and ascites  Persistent vomiting ? gastric outlet obstruction vs dysfunctional stomach related to tumor or ascites  - did not respond to most recent paracentesis 1.2 L 3/20  Febrile neutropenia (did receive G-CSF) - Dr. Truett Perna expects counts to recover WBC moved up to 1.4 today but ANC stil 0.1  Anticoagulant therapy (currently heparin) for DVT ----------------------------------------------------------------------------------------------------------------------  Continue supportive care at this time. He has not been out of bed - OOB to chair ordered Neutropenia limits ability to do EGD - once ANC >1500 will consider EGD to see if anatomy shows stricture that could be stented to relieve gastric outlet obstruction symptoms.  Would need to hold anti-coagulant Ascites and carcinomatosis prevents gastrostomy  I do not think TPN a good option at this time and in general try to avoid in cancer patients  Iva Boop, MD, [facility] See Loretha Stapler on call - gastroenterology for best contact person 10/29/2023 12:25 PM   Subjective  On clear liquids - no vomiting today  Objective  BP 109/82 (BP Location: Left Arm)   Pulse 96   Temp 99.3 F (37.4 C) (Oral)   Resp 14   Ht 6\' 1"  (1.854 m)   Wt 57.7 kg   SpO2 100%   BMI 16.77 kg/m  Chronically ill NAD Abd soft NT, not sig distended      Latest Ref Rng & Units 10/29/2023    4:03 AM 10/28/2023    2:35 AM 10/27/2023    3:53 AM  CBC  WBC 4.0 - 10.5 K/uL 1.4  1.1  1.1   Hemoglobin 13.0 - 17.0 g/dL 9.8  9.9  9.7   Hematocrit 39.0 - 52.0 % 30.7  30.6  31.1   Platelets 150 - 400 K/uL 130  112  113    Recent Labs  Lab 10/23/23 1603 10/24/23 0537 10/25/23 0400 10/26/23 0202 10/27/23 0353 10/29/23 0403  NA 133* 130* 129* 131* 131* 130*  K 3.0* 3.3* 3.1* 4.0 3.8 3.7   CL 92* 95* 98 100 100 101  CO2 29 25 25 22  21* 20*  GLUCOSE 118* 80 75 72 75 78  BUN 14 11 8 8 8 8   CREATININE 0.77 0.65 0.63 0.55* 0.65 0.71  CALCIUM 8.9 8.4* 8.0* 8.0* 8.3* 8.3*  MG 2.1  --  2.3  --   --   --   PHOS 3.3  --   --   --   --   --        CT abd pelvis 3/19 - images reviewed   Stomach/Bowel: The stomach is distended with fluid. Gastric wall thickening is noted about the antrum and proximal duodenum. A focally dilated loop of jejunum is noted in the mid left abdomen measuring up to 3.6 cm, with no obvious obstruction. Contrast is identified in the colon. There is thickening of the walls of the mid transverse colon. The appendix is not seen   IMPRESSION: 1. Focally dilated loop of jejunum in the left upper quadrant without obvious evidence of obstruction. Findings may represent possible ileus. 2. A focal thickening of the walls of the mid transverse colon, which may be reactive versus other infectious or inflammatory process. 3. Bowel wall thickening involving the gastric antrum and pylorus, similar in appearance to the prior exam. 4. Moderate-to-large ascites with peritoneal enhancement and omental caking, suspicious for peritoneal  carcinomatosis. 5. Aortic atherosclerosis. 6. Remaining incidental findings as described above.  Iva Boop, MD, [facility] See Loretha Stapler on call - gastroenterology for best contact person 10/29/2023 12:25 PM

## 2023-10-29 NOTE — Progress Notes (Signed)
 PHARMACY - ANTICOAGULATION CONSULT NOTE  Pharmacy Consult for heparin Indication:  h/o renal infarct and h/o DVT  (PTA Eliquis on hold)  Allergies  Allergen Reactions   Pravastatin Itching   Simvastatin Itching    Patient Measurements: Height: 6\' 1"  (185.4 cm) Weight: 57.7 kg (127 lb 1.6 oz) IBW/kg (Calculated) : 79.9 Heparin Dosing Weight: 57.6 kg  Vital Signs: Temp: 98 F (36.7 C) (03/23 0535) Temp Source: Oral (03/23 0535) BP: 98/77 (03/23 0535) Pulse Rate: 105 (03/23 0535)  Labs: Recent Labs    10/27/23 0353 10/27/23 1130 10/27/23 1755 10/28/23 0235 10/29/23 0403  HGB 9.7*  --   --  9.9* 9.8*  HCT 31.1*  --   --  30.6* 30.7*  PLT 113*  --   --  112* 130*  HEPARINUNFRC 0.28*   < > 0.44 0.33 0.19*  CREATININE 0.65  --   --   --  0.71   < > = values in this interval not displayed.    Estimated Creatinine Clearance: 80.1 mL/min (by C-G formula based on SCr of 0.71 mg/dL).   Medical History: Past Medical History:  Diagnosis Date   Chest pain    stres test neg x 2, cath 5/12; minimal LAD irregs; no obs CAD; normal LVF   Clotting disorder (HCC)    dvt   Deep vein thrombosis (HCC) 10/06/2010   GERD (gastroesophageal reflux disease)    History of DVT of lower extremity    on chronic coumadin   Hypertension    Internal hemorrhoids    Cscope 2007   PAD (peripheral artery disease) (HCC)    a. left leg ischemia tx wtih embolectomy of left fem, pop, tib arteries with Dr. Edilia Bo - 08/2006;  b. known occlusion of right pop with collats - med Rx;  c.ABI's 8/09: R 1.0; L 0.91    PFO (patent foramen ovale)    per 08/2006 discharge summary, TEE showed EF 60%, small PFO with minimal right-to-left shunt with Valsalva; not felt to be the source of emboli, lifelong Coumadin recommended   Pneumonia    history of   Polysubstance abuse (HCC)    marijuana only   Renal infarct [facility])    R    Assessment: Patient is a 63 y.o. male with hx HTN, PAD, renal infarct, stage IV  gastric cancer on chemo, and LE DVT in 2008 on Eliquis PTA who presented to the ED on 10/23/23 with c/o severe nausea and vomiting. Pt received last dose (cycle 5) of FOLFOX/nivolumab on 10/18/23. He reported nausea with recurrent bouts of vomiting since 10/20/23, which couldn't be improved by Protonix, Carafate, and [name] at home. Anticoagulation changed to heparin due to poor oral intake secondary to nausea and vomiting and in case invasive intervention is needed. - Last dose Eliquis was taken on 3/17 at 10am.   Today, 10/25/23: - Heparin level 0.46, therapeutic on 1150 units/hr - CBC: pancytopenia related to recent chemo.  Hgb 9.8, plts 130 - No bleeding or complications reported by RN  Goal of Therapy:  Heparin level 0.3-0.7 units/ml aPTT 66-102 seconds Monitor platelets by anticoagulation protocol: Yes   Plan:  - Continue heparin IV 1150 units/hr - Confirmatory heparin level in 6 hours - Daily CBC& heparin level - Monitor for s/sx bleeding. - Follow up plans for EGD and hold time for heparin if needed.     Lynann Beaver PharmD, BCPS [facility] main pharmacy [phone] 10/29/2023 12:45 PM

## 2023-10-29 NOTE — Progress Notes (Signed)
 PHARMACY - ANTICOAGULATION CONSULT NOTE  Pharmacy Consult for heparin Indication:  h/o renal infarct and h/o DVT  (PTA Eliquis on hold)  Allergies  Allergen Reactions   Pravastatin Itching   Simvastatin Itching    Patient Measurements: Height: 6\' 1"  (185.4 cm) Weight: 57.7 kg (127 lb 1.6 oz) IBW/kg (Calculated) : 79.9 Heparin Dosing Weight: 57.6 kg  Vital Signs: Temp: 98.6 F (37 C) (03/23 1534) Temp Source: Oral (03/23 1047) BP: 96/74 (03/23 1534) Pulse Rate: 93 (03/23 1534)  Labs: Recent Labs    10/27/23 0353 10/27/23 1130 10/28/23 0235 10/29/23 0403 10/29/23 1150 10/29/23 1751  HGB 9.7*  --  9.9* 9.8*  --   --   HCT 31.1*  --  30.6* 30.7*  --   --   PLT 113*  --  112* 130*  --   --   HEPARINUNFRC 0.28*   < > 0.33 0.19* 0.46 0.51  CREATININE 0.65  --   --  0.71  --   --    < > = values in this interval not displayed.    Estimated Creatinine Clearance: 80.1 mL/min (by C-G formula based on SCr of 0.71 mg/dL).   Assessment: Patient is a 63 y.o. male with hx HTN, PAD, renal infarct, stage IV gastric cancer on chemo, and LE DVT in 2008 on Eliquis PTA who presented to the ED on 10/23/23 with c/o severe nausea and vomiting. Pt received last dose (cycle 5) of FOLFOX/nivolumab on 10/18/23. He reported nausea with recurrent bouts of vomiting since 10/20/23, which couldn't be improved by Protonix, Carafate, and [name] at home. Anticoagulation changed to heparin due to poor oral intake secondary to nausea and vomiting and in case invasive intervention is needed. - Last dose Eliquis was taken on 3/17 at 10am.   Today, 10/29/2023: - Confirmatory heparin level remains therapeutic and stable on 1150 units/hr - CBC: pancytopenia related to recent chemo.  Hgb 9.8, plts 130 - No bleeding or complications reported by RN  Goal of Therapy:  Heparin level 0.3-0.7 units/ml Monitor platelets by anticoagulation protocol: Yes   Plan:  - Continue heparin IV at 1150 units/hr - Daily CBC&  heparin level - Monitor for s/sx bleeding. - Follow up plans for EGD and hold time for heparin if needed.    Bernadene Person, PharmD, BCPS [phone] 10/29/2023, 7:15 PM

## 2023-10-29 NOTE — Progress Notes (Signed)
 Mobility Specialist - Progress Note   10/29/23 1247  Mobility  Activity Ambulated with assistance in hallway  Level of Assistance Modified independent, requires aide device or extra time  Assistive Device Other (Comment) (IV Pole)  Distance Ambulated (ft) 500 ft  Activity Response Tolerated well  Mobility Referral Yes  Mobility visit 1 Mobility  Mobility Specialist Start Time (ACUTE ONLY) 1230  Mobility Specialist Stop Time (ACUTE ONLY) 1247  Mobility Specialist Time Calculation (min) (ACUTE ONLY) 17 min   Pt received in bed and agreeable to mobility. No complaints during session. Pt to bed after session with all needs met.    [facility]

## 2023-10-29 NOTE — Progress Notes (Signed)
 Progress Note   Patient: [name] ZOX:[id number] DOB: [birth year] DOA: 10/23/2023     4 DOS: the patient was seen and examined on 10/29/2023   Brief hospital course:  63 year old male with gastric cancer who is undergoing chemotherapy. He has chronic issues with nausea vomiting and abdominal pain however, he presented due to feeling worse over the past few days. He states that he was having episodes of intractable nausea and vomiting and had been unable to tolerate liquids and solids.  Workup on admission was significant for neutropenia.  CT scan revealed dilated loops of jejunum suggesting ileitis.  Some focal thickening of the transverse colon was also reported.  Moderate to large ascites were noted secondary to peritoneal carcinomatosis.  Status post paracentesis. Assessment and Plan: # Abdominal pain, nausea and vomiting secondary to metastatic gastric cancer with malignant ascites: GI consult note appreciated.  Oncology also following.  CT scan confirmed findings of small bowel ileus.  Conservative management.  Status post abdominal paracentesis.  #Moderate to large ascites with findings of peritoneal enhancement, consistent with peritoneal carcinomatosis  #Severe neutropenia with fever: Most likely chemotherapy-induced.  Seen and evaluated by oncology.  Patient being challenged with G-CSF.  Patient was empirically covered with vancomycin, cefepime and Flagyl.  Workup including blood cultures, urinalysis and chest x-ray has thus far been negative.  #Cachexia secondary to advanced stage IV gastric cancer disease.  Concerns for gastric outlet obstruction versus dysfunctional stomach related to tumor.  GI recommends supportive care at this time.  Patient has been consented for EGD but procedure was limited due to his severe neutropenia.  Target ANC greater than 1500.  Not a good candidate for gastrostomy tube due to carcinomatosis and ascites.  #Thrombocytopenia: Most likely chemotherapy  induced.  #Protein energy malnutrition: BMI index of 16.7.  Patient has poor oral intake due to gastric mass.  Limitations with gastrostomy tube placement due to peritoneal carcinomatosis.    Subjective: Patient clinically doing much better today.  Denies any nausea vomiting.  Tolerating oral intake well.  Physical Exam: Vitals:   10/29/23 0258 10/29/23 0535 10/29/23 1047 10/29/23 1534  BP: 103/78 98/77 109/82 96/74  Pulse: (!) 109 (!) 105 96 93  Resp: 18 18 14 16   Temp: 98.3 F (36.8 C) 98 F (36.7 C) 99.3 F (37.4 C) 98.6 F (37 C)  TempSrc: Oral Oral Oral   SpO2: 99% 98% 100% 100%  Weight:      Height:       Patient is a cachectic looking gentleman.  He was in good mood.  Sitting up comfortably in bed. HEENT: Oral mucosa moist dentition intact.  Head is atraumatic Neck is supple with no JVD Chest clinically adequate breath sounds. Cardiovascular: S1-S2 Abdomen: Soft with no guarding. Extremities: Thin no pedal edema CNS shows no focal deficit. Data Reviewed:  IMPRESSION: 1. Focally dilated loop of jejunum in the left upper quadrant without obvious evidence of obstruction. Findings may represent possible ileus. 2. A focal thickening of the walls of the mid transverse colon, which may be reactive versus other infectious or inflammatory process. 3. Bowel wall thickening involving the gastric antrum and pylorus, similar in appearance to the prior exam. 4. Moderate-to-large ascites with peritoneal enhancement and omental caking, suspicious for peritoneal carcinomatosis. 5. Aortic atherosclerosis. 6. Remaining incidental findings as described above.  Family Communication: Family not at bedside.  Family was on the phone whilst I was seen patient.  Disposition: Status is: Inpatient Remains inpatient appropriate because: Pending EGD  Planned Discharge Destination: Home    Time spent: 30 minutes  Author: Lilia Pro, MD 10/29/2023 3:46 PM  For on call  review [email or web address].

## 2023-10-30 DIAGNOSIS — R5081 Fever presenting with conditions classified elsewhere: Secondary | ICD-10-CM

## 2023-10-30 DIAGNOSIS — D709 Neutropenia, unspecified: Secondary | ICD-10-CM

## 2023-10-30 DIAGNOSIS — C169 Malignant neoplasm of stomach, unspecified: Secondary | ICD-10-CM | POA: Diagnosis not present

## 2023-10-30 DIAGNOSIS — C800 Disseminated malignant neoplasm, unspecified: Secondary | ICD-10-CM | POA: Diagnosis not present

## 2023-10-30 DIAGNOSIS — R112 Nausea with vomiting, unspecified: Secondary | ICD-10-CM | POA: Diagnosis not present

## 2023-10-30 DIAGNOSIS — Z7901 Long term (current) use of anticoagulants: Secondary | ICD-10-CM

## 2023-10-30 DIAGNOSIS — R188 Other ascites: Secondary | ICD-10-CM

## 2023-10-30 LAB — CBC WITH DIFFERENTIAL/PLATELET
Abs Immature Granulocytes: 0.32 10*3/uL — ABNORMAL HIGH (ref 0.00–0.07)
Basophils Absolute: 0 10*3/uL (ref 0.0–0.1)
Basophils Relative: 1 %
Eosinophils Absolute: 0 10*3/uL (ref 0.0–0.5)
Eosinophils Relative: 1 %
HCT: 29.6 % — ABNORMAL LOW (ref 39.0–52.0)
Hemoglobin: 9.7 g/dL — ABNORMAL LOW (ref 13.0–17.0)
Immature Granulocytes: 9 %
Lymphocytes Relative: 25 %
Lymphs Abs: 0.9 10*3/uL (ref 0.7–4.0)
MCH: 29 pg (ref 26.0–34.0)
MCHC: 32.8 g/dL (ref 30.0–36.0)
MCV: 88.4 fL (ref 80.0–100.0)
Monocytes Absolute: 1.1 10*3/uL — ABNORMAL HIGH (ref 0.1–1.0)
Monocytes Relative: 30 %
Neutro Abs: 1.2 10*3/uL — ABNORMAL LOW (ref 1.7–7.7)
Neutrophils Relative %: 34 %
Platelets: 148 10*3/uL — ABNORMAL LOW (ref 150–400)
RBC: 3.35 MIL/uL — ABNORMAL LOW (ref 4.22–5.81)
RDW: 16 % — ABNORMAL HIGH (ref 11.5–15.5)
WBC: 3.6 10*3/uL — ABNORMAL LOW (ref 4.0–10.5)
nRBC: 0.8 % — ABNORMAL HIGH (ref 0.0–0.2)

## 2023-10-30 LAB — HEPARIN LEVEL (UNFRACTIONATED): Heparin Unfractionated: 0.46 [IU]/mL (ref 0.30–0.70)

## 2023-10-30 MED ORDER — POLYETHYLENE GLYCOL 3350 17 G PO PACK
17.0000 g | PACK | Freq: Every day | ORAL | Status: DC
  Administered 2023-11-01: 17 g via ORAL
  Filled 2023-10-30 (×2): qty 1

## 2023-10-30 NOTE — H&P (View-Only) (Signed)
     [facility] Progress Note  CC:  Nausea and vomiting; metastatic gastric cancer  Subjective:  Feels ok.  Says that he felt better yesterday than he does today.  Keeps getting a lot of indigestion.  Objective:  Vital signs in last 24 hours: Temp:  [97.7 F (36.5 C)-98.8 F (37.1 C)] 98.8 F (37.1 C) (03/24 0452) Pulse Rate:  [93-96] 94 (03/24 0452) Resp:  [16-18] 18 (03/24 0452) BP: (95-105)/(65-83) 95/65 (03/24 0452) SpO2:  [98 %-100 %] 100 % (03/24 0452) Last BM Date : 10/27/23 General:  Alert, thin, in NAD Heart:  Regular rate and rhythm; no murmurs Pulm:  CTAB.  No W/R/R. Abdomen:  Soft, non-distended.  BS present.  Non-tender. Extremities:  Without edema. Neurologic:  Alert and oriented x 4;  grossly normal neurologically. Psych:  Alert and cooperative. Normal mood and affect.  Intake/Output from previous day: 03/23 0701 - 03/24 0700 In: 735 [IV Piggyback:735] Out: 300 [Urine:300]  Lab Results: Recent Labs    10/28/23 0235 10/29/23 0403 10/30/23 0258  WBC 1.1* 1.4* 3.6*  HGB 9.9* 9.8* 9.7*  HCT 30.6* 30.7* 29.6*  PLT 112* 130* 148*   BMET Recent Labs    10/29/23 0403  NA 130*  K 3.7  CL 101  CO2 20*  GLUCOSE 78  BUN 8  CREATININE 0.71  CALCIUM 8.3*   LFT Recent Labs    10/29/23 0403  PROT 4.9*  ALBUMIN 2.0*  AST 11*  ALT 9  ALKPHOS 60  BILITOT 0.9   Assessment / Plan: Metastatic gastric cancer w/ carcinomatosis and ascites   Persistent vomiting ? gastric outlet obstruction vs dysfunctional stomach related to tumor or ascites - did not respond to most recent paracentesis 1.2 L 3/20   Febrile neutropenia (did receive G-CSF) - Dr. Truett Perna expects counts to recover. WBC moved up to 3.6 today and ANC is 1.2.   Anticoagulant therapy (currently heparin) for DVT  *Continue supportive care at this time. *Neutropenia limits ability to do EGD - once ANC >1500 will consider EGD to see if anatomy shows stricture that could be  stented to relieve gastric outlet obstruction symptoms.  Would need to hold anti-coagulant, IV heparin for 4-6 hours prior to procedure.  Anticipate that we may be able to put this on for tomorrow, 3/25, with dramatic increase in his numbers since yesterday.  Will await input from Dr. [name] and then would message pharmacy about heparin. *Ascites and carcinomatosis prevents gastrostomy.  *Do not think TPN a good option at this time and in general try to avoid in cancer patients.    LOS: 5 days   Princella Pellegrini. Jearline Hirschhorn  10/30/2023, 11:32 AM

## 2023-10-30 NOTE — Progress Notes (Signed)
 PHARMACY - ANTICOAGULATION CONSULT NOTE  Pharmacy Consult for heparin Indication:  h/o renal infarct and h/o DVT  (PTA Eliquis on hold)  Allergies  Allergen Reactions   Pravastatin Itching   Simvastatin Itching    Patient Measurements: Height: 6\' 1"  (185.4 cm) Weight: 57.7 kg (127 lb 1.6 oz) IBW/kg (Calculated) : 79.9 Heparin Dosing Weight: 57.7 kg  Vital Signs: Temp: 98.8 F (37.1 C) (03/24 0452) Temp Source: Oral (03/24 0452) BP: 95/65 (03/24 0452) Pulse Rate: 94 (03/24 0452)  Labs: Recent Labs    10/28/23 0235 10/29/23 0403 10/29/23 1150 10/29/23 1751 10/30/23 0258  HGB 9.9* 9.8*  --   --  9.7*  HCT 30.6* 30.7*  --   --  29.6*  PLT 112* 130*  --   --  148*  HEPARINUNFRC 0.33 0.19* 0.46 0.51 0.46  CREATININE  --  0.71  --   --   --     Estimated Creatinine Clearance: 80.1 mL/min (by C-G formula based on SCr of 0.71 mg/dL).   Assessment: Patient is a 63 y.o. male with hx HTN, PAD, renal infarct, stage IV gastric cancer on chemo, and LE DVT in 2008 on Eliquis PTA who presented to the ED on 10/23/23 with c/o severe nausea and vomiting. Pt received last dose (cycle 5) of FOLFOX/nivolumab on 10/18/23. He reported nausea with recurrent bouts of vomiting since 10/20/23, which couldn't be improved by Protonix, Carafate, and [name] at home. Anticoagulation changed to heparin due to poor oral intake secondary to nausea and vomiting and in case invasive intervention is needed. - Last dose Eliquis was taken on 3/17 at 10am.   Today, 10/30/2023: - Heparin level remains therapeutic and stable at 0.46 on 1150 units/hr - CBC: pancytopenia related to recent chemo. Hgb 9.7, plts improves to 148 - No bleeding reported by RN - GI plans to do EGD once ANC > 1500. Needs to hold heparin 4-6 hrs prior to the EGD. ANC 1200 today.  Goal of Therapy:  Heparin level 0.3-0.7 units/ml Monitor platelets by anticoagulation protocol: Yes   Plan:  - Continue heparin IV at 1150 units/hr - Daily  CBC& heparin level - Monitor for s/sx bleeding - Follow up plans for EGD   Thank you for allowing pharmacy to be a part of this patient's care.  Tory Emerald, PharmD Candidate 10/30/2023 7:43 AM

## 2023-10-30 NOTE — Plan of Care (Signed)
 VSS. Patient given PRN Dilaudid and Tylenol for abdominal pain. Heparin gtt infusing at 11.83ml/hr. No evidence of bleeding. No acute events overnight. Wife remains at bedside.  Problem: Education: Goal: Knowledge of General Education information will improve Description: Including pain rating scale, medication(s)/side effects and non-pharmacologic comfort measures Outcome: Progressing   Problem: Clinical Measurements: Goal: Ability to maintain clinical measurements within normal limits will improve Outcome: Progressing Goal: Will remain free from infection Outcome: Progressing   Problem: Activity: Goal: Risk for activity intolerance will decrease Outcome: Progressing   Problem: Nutrition: Goal: Adequate nutrition will be maintained Outcome: Progressing   Problem: Coping: Goal: Level of anxiety will decrease Outcome: Progressing   Problem: Pain Managment: Goal: General experience of comfort will improve and/or be controlled Outcome: Progressing   Problem: Safety: Goal: Ability to remain free from injury will improve Outcome: Progressing

## 2023-10-30 NOTE — Progress Notes (Signed)
 Pharmacy Antibiotic Note  [name] is a 63 y.o. male with metastatic gastric cancer admitted on 10/23/2023 with severe nausea and vomiting s/p recent chemo . Pt experienced pancytopenia since 10/27/2023 with concerns for febrile neutropenia. Patient is currently on vancomycin, cefepime, and Flagyl.  Today, 3/24: - ANC up to 1.2, WBC up to 3.6 - Afebrile, Tmax 99.43F - On Day 4 of abx - Last Scr 0.71 on 3/23   Plan: - Continue vancomycin 750 mg IV q12h (calculated AUC 509 based on Scr rounded to 0.8, wt 57.7 kg) - Continue cefepime 2 gm IV q8h - Continue Flagyl 500 mg IV q12h - Follow-up plan for abx - LOT. If plan is to treat with abx >7 days, then will check vancomycin level.  Height: 6\' 1"  (185.4 cm) Weight: 57.7 kg (127 lb 1.6 oz) IBW/kg (Calculated) : 79.9  Temp (24hrs), Avg:98.6 F (37 C), Min:97.7 F (36.5 C), Max:99.3 F (37.4 C)  Recent Labs  Lab 10/24/23 0537 10/25/23 0400 10/26/23 0202 10/26/23 1800 10/27/23 0353 10/28/23 0235 10/29/23 0403 10/30/23 0258  WBC 22.2* 9.3 3.5* 1.9* 1.1* 1.1* 1.4* 3.6*  CREATININE 0.65 0.63 0.55*  --  0.65  --  0.71  --     Estimated Creatinine Clearance: 80.1 mL/min (by C-G formula based on SCr of 0.71 mg/dL).    Allergies  Allergen Reactions   Pravastatin Itching   Simvastatin Itching    Antimicrobials this admission: Vancomycin 3.21 >>  Cefepime 3/21 >>  Flagyl 3/21 >>  Dose adjustments this admission: N/A  Microbiology results: 3/17 resp panel: neg 3/21 BCx: NGTD x2 days   Thank you for allowing pharmacy to be a part of this patient's care.  Tory Emerald 10/30/2023 8:49 AM

## 2023-10-30 NOTE — Progress Notes (Addendum)
 [name]   DOB:10-19-1960   WU#:[id number]      ASSESSMENT & PLAN:  Gastric cancer, stage IV Peritoneal carcinomatosis  - Initially diagnosed December 2024 - Pathology shows metastatic poorly differentiated carcinoma consistent with primary gastric carcinoma, with signet cell features - Patient was started on FOLFOX/nivolumab cycle 1 on 08/16/2023.  Plan for every 2 weeks.  Completed 3 cycles and was admitted in February 2025 with intractable nausea/vomiting and dehydration.   - Treated with FOLFOX/nivolumab on 10/18/2023, G-CSF 10/20/2023 - CT abd/pelvis done 3/19 suspicious for peritoneal carcinomatosis -Medical oncology/Dr. Truett Perna has been following.   Ascites, malignant Possible Ileus Abdominal pain Nausea/vomiting -Secondary to malignancy - Vomiting ongoing.  Had 1 episode this morning. - U/S abdomen done 10/24/2023 showed moderate diffuse ascites. - Has had previous paracentesis prior to this hospitalization.  Status post paracentesis 10/20/2023 with 4.6 L removed.  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 Also moderate to large ascites with peritoneal enhancement and omental caking, suspicious for peritoneal carcinomatosis.  - vomiting may be related to gastric outlet obstruction or carcinomatosis. - Palliative paracentesis as needed  - GI considering dx endoscopy and stent placement.   - Continue pain management and antiemetics as ordered   Poor appetite Weight loss - Diet advanced to soft diet.   - Encourage more out of bed to chair - Recommend Surg eval for feeding tube.     History of DVT - Remains on IV heparin drip at this time - on Eliquis at home, resume if there is no invasive procedure planned.   - Monitor closely for bleeding   Pancytopenia: Leukopenia - improving. WBC 3.6 with ANC 1.2  - likely due to recent chemotherapy - monitor closely   Anemia - Hemoglobin stable 9.7  - Likely  multifactorial due to recent therapy, nutritional deficiencies and malignancy - Continue to monitor CBC with differential   Thrombocytopenia - Platelets improving 148k   - Continue to monitor CBC with differential   Fever - stable 98.8 today  - continue antibiotics as ordered  - Monitor fever curve      Code Status Full  Subjective:  Patient seen awake and alert sitting up in bed.  Reports he had 1 episode of vomiting this morning.  Abdominal pain is controlled with pain meds.  No other acute complaints.  Objective:  Vitals:   10/29/23 1948 10/30/23 0452  BP: 105/83 95/65  Pulse: 96 94  Resp: 18 18  Temp: 97.7 F (36.5 C) 98.8 F (37.1 C)  SpO2: 98% 100%     Intake/Output Summary (Last 24 hours) at 10/30/2023 1032 Last data filed at 10/29/2023 2011 Gross per 24 hour  Intake 734.96 ml  Output 100 ml  Net 634.96 ml     REVIEW OF SYSTEMS:   Constitutional: Denies fevers, chills or abnormal night sweats Eyes: Denies blurriness of vision, double vision or watery eyes Ears, nose, mouth, throat, and face: Denies mucositis or sore throat Respiratory: Denies cough, dyspnea or wheezes Cardiovascular: Denies palpitation, chest discomfort or lower extremity swelling Gastrointestinal: + Vomiting, heartburn or change in bowel habits Skin: Denies abnormal skin rashes Lymphatics: Denies new lymphadenopathy or easy bruising Neurological: Denies numbness, tingling or new weaknesses Behavioral/Psych: Mood is stable, no new changes  All other systems were reviewed with the patient and are negative.  PHYSICAL EXAMINATION: ECOG PERFORMANCE STATUS: 2 - Symptomatic, <50% confined to bed  Vitals:   10/29/23 1948 10/30/23 2956  BP: 105/83 95/65  Pulse: 96 94  Resp: 18 18  Temp: 97.7 F (36.5 C) 98.8 F (37.1 C)  SpO2: 98% 100%   Filed Weights   10/23/23 1939 10/25/23 1800  Weight: 127 lb (57.6 kg) 127 lb 1.6 oz (57.7 kg)    GENERAL: alert, + frail + cachectic SKIN: skin  color, texture, turgor are normal, no rashes or significant lesions EYES: normal, conjunctiva are pink and non-injected, sclera clear OROPHARYNX: Mild thrush of the buccal mucosa NECK: supple, thyroid normal size, non-tender, without nodularity LYMPH: no palpable lymphadenopathy in the cervical, axillary or inguinal LUNGS: clear to auscultation and percussion with normal breathing effort HEART: regular rate & rhythm and no murmurs and no lower extremity edema ABDOMEN: abdomen soft, non-tender and normal bowel sounds MUSCULOSKELETAL: no cyanosis of digits and no clubbing  PSYCH: alert & oriented x 3 with fluent speech NEURO: no focal motor/sensory deficits   All questions were answered. The patient knows to call the clinic with any problems, questions or concerns.   The total time spent in the appointment was 40 minutes encounter with patient including review of chart and various tests results, discussions about plan of care and coordination of care plan  [name], NP 10/30/2023 10:32 AM    Labs Reviewed:  Lab Results  Component Value Date   WBC 3.6 (L) 10/30/2023   HGB 9.7 (L) 10/30/2023   HCT 29.6 (L) 10/30/2023   MCV 88.4 10/30/2023   PLT 148 (L) 10/30/2023   Recent Labs    10/23/23 1603 10/24/23 0537 10/25/23 0400 10/26/23 0202 10/27/23 0353 10/29/23 0403  NA 133* 130*   < > 131* 131* 130*  K 3.0* 3.3*   < > 4.0 3.8 3.7  CL 92* 95*   < > 100 100 101  CO2 29 25   < > 22 21* 20*  GLUCOSE 118* 80   < > 72 75 78  BUN 14 11   < > 8 8 8   CREATININE 0.77 0.65   < > 0.55* 0.65 0.71  CALCIUM 8.9 8.4*   < > 8.0* 8.3* 8.3*  GFRNONAA >60 >60   < > >60 >60 >60  PROT 5.8* 5.0*  --   --   --  4.9*  ALBUMIN 2.6* 2.3*  --   --   --  2.0*  AST 24 21  --   --   --  11*  ALT 10 8  --   --   --  9  ALKPHOS 169* 117  --   --   --  60  BILITOT 0.8 0.8  --   --   --  0.9  BILIDIR  --  <0.1  --   --   --   --   IBILI  --  NOT CALCULATED  --   --   --   --    < > = values in this  interval not displayed.    Studies Reviewed:  [facility] Chest Port 1V same Day Result Date: 10/27/2023 CLINICAL DATA:  Fevers EXAM: PORTABLE CHEST 1 VIEW COMPARISON:  01/08/2020 FINDINGS: Cardiac shadow is within normal limits. Lungs are well aerated bilaterally. No focal infiltrate or effusion is seen. Right chest wall port is noted in satisfactory position. No bony abnormality is noted. IMPRESSION: No active disease. Electronically Signed   By: Alcide Clever M.D.   On: 10/27/2023 21:29   US Paracentesis Result Date: 10/26/2023 INDICATION: Patient with history of gastric  cancer and recurrent ascites. Request for diagnostic and therapeutic paracentesis. EXAM: ULTRASOUND GUIDED RIGHT LOWER QUADRANT PARACENTESIS MEDICATIONS: 1% plain lidocaine, 5 mL COMPLICATIONS: None immediate. PROCEDURE: Informed written consent was obtained from the patient after a discussion of the risks, benefits and alternatives to treatment. A timeout was performed prior to the initiation of the procedure. Initial ultrasound scanning demonstrates a large amount of ascites within the right lower abdominal quadrant. The right lower abdomen was prepped and draped in the usual sterile fashion. 1% lidocaine was used for local anesthesia. Following this, a 19 gauge, 7-cm, Yueh catheter was introduced. An ultrasound image was saved for documentation purposes. The paracentesis was performed. The catheter was removed and a dressing was applied. The patient tolerated the procedure well without immediate post procedural complication. FINDINGS: A total of approximately 1.2 L of hazy yellow fluid was removed. Samples were sent to the laboratory as requested by the clinical team. IMPRESSION: Successful ultrasound-guided paracentesis yielding 1.2 liters of peritoneal fluid. Procedure performed by Brayton El PA-C and supervised by Dr. Ruel Favors Electronically Signed   By: Judie Petit.  Shick M.D.   On: 10/26/2023 15:19   CT ABDOMEN PELVIS W CONTRAST Result  Date: 10/25/2023 CLINICAL DATA:  Epigastric pain. History of gastric cancer. Assess treatment response. Concern for possible carcinomatosis. EXAM: CT ABDOMEN AND PELVIS WITH CONTRAST TECHNIQUE: Multidetector CT imaging of the abdomen and pelvis was performed using the standard protocol following bolus administration of intravenous contrast. RADIATION DOSE REDUCTION: This exam was performed according to the departmental dose-optimization program which includes automated exposure control, adjustment of the mA and/or kV according to patient size and/or use of iterative reconstruction technique. CONTRAST:  OMNIPAQUE IOHEXOL 300 MG/ML  SOLN COMPARISON:  09/28/2023, 03/28/2023. FINDINGS: Lower chest: The heart is normal in size and there is a small pericardial effusion. Emphysematous changes are present in the lungs. Mild atelectasis or scarring is noted at the lung bases. Hepatobiliary: Scattered subcentimeter hypodensities are present in the liver common decreased in size from 03/28/2023 and not well seen on 09/28/2023. The gallbladder is surgically absent. No biliary ductal dilatation is seen. Pancreas: Unremarkable. No pancreatic ductal dilatation or surrounding inflammatory changes. Spleen: Normal in size without focal abnormality. Adrenals/Urinary Tract: The adrenal glands are within normal limits. The kidneys enhance symmetrically. Subcentimeter hypodensities are present in the kidneys bilaterally which are too small to further characterize. Renal cortical scarring is present on the right. No renal calculus or hydronephrosis bilaterally. No obvious bladder abnormality is seen, however examination is limited due to hardware artifact. Stomach/Bowel: The stomach is distended with fluid. Gastric wall thickening is noted about the antrum and proximal duodenum. A focally dilated loop of jejunum is noted in the mid left abdomen measuring up to 3.6 cm, with no obvious obstruction. Contrast is identified in the colon.  There is thickening of the walls of the mid transverse colon. The appendix is not seen. Vascular/Lymphatic: Aortic atherosclerosis. No enlarged abdominal or pelvic lymph nodes. Reproductive: Prostate is unremarkable. Other: There is moderate-to-large ascites with peritoneal enhancement and omental caking in the upper abdomen, concerning for peritoneal carcinomatosis. Musculoskeletal: Total hip arthroplasty changes are present on the left. Degenerative changes are present in the thoracolumbar spine. A pars defect is noted at L5 on the left. No acute osseous abnormality is seen. IMPRESSION: 1. Focally dilated loop of jejunum in the left upper quadrant without obvious evidence of obstruction. Findings may represent possible ileus. 2. A focal thickening of the walls of the mid transverse colon, which  may be reactive versus other infectious or inflammatory process. 3. Bowel wall thickening involving the gastric antrum and pylorus, similar in appearance to the prior exam. 4. Moderate-to-large ascites with peritoneal enhancement and omental caking, suspicious for peritoneal carcinomatosis. 5. Aortic atherosclerosis. 6. Remaining incidental findings as described above. Electronically Signed   By: Thornell Sartorius M.D.   On: 10/25/2023 20:53   US Abdomen Limited Result Date: 10/24/2023 CLINICAL DATA:  Ascites EXAM: LIMITED ABDOMEN ULTRASOUND FOR ASCITES TECHNIQUE: Limited ultrasound survey for ascites was performed in all four abdominal quadrants. COMPARISON:  09/28/2023 FINDINGS: Moderate ascites identified particularly right hemiabdomen and left lower quadrant. IMPRESSION: Moderate diffuse ascites. Electronically Signed   By: Karen Kays M.D.   On: 10/24/2023 13:24   DG Abd 1 View Result Date: 10/23/2023 CLINICAL DATA:  Nausea, vomiting EXAM: ABDOMEN - 1 VIEW COMPARISON:  None Available. FINDINGS: The bowel gas pattern is normal. No radio-opaque calculi or other significant radiographic abnormality are seen. Moderate  stool burden throughout the colon. IMPRESSION: Moderate stool burden.  No acute findings. Electronically Signed   By: Charlett Nose M.D.   On: 10/23/2023 22:38   IR Paracentesis Result Date: 10/20/2023 INDICATION: Patient with history of gastric cancer, recurrent ascites. Request for diagnostic and therapeutic paracentesis. EXAM: ULTRASOUND GUIDED DIAGNOSTIC AND THERAPEUTIC PARACENTESIS MEDICATIONS: 7 mL 1% lidocaine. COMPLICATIONS: None immediate. PROCEDURE: Informed written consent was obtained from the patient after a discussion of the risks, benefits and alternatives to treatment. A timeout was performed prior to the initiation of the procedure. Initial ultrasound scanning demonstrates a large amount of ascites within the right lower abdominal quadrant. The right lower abdomen was prepped and draped in the usual sterile fashion. 1% lidocaine was used for local anesthesia. Following this, a 19 gauge, 7-cm, Yueh catheter was introduced. An ultrasound image was saved for documentation purposes. The paracentesis was performed. The catheter was removed and a dressing was applied. The patient tolerated the procedure well without immediate post procedural complication. FINDINGS: A total of approximately 4.6 L of clear yellow fluid was removed. Samples were sent to the laboratory as requested by the clinical team. IMPRESSION: Successful ultrasound-guided paracentesis yielding 4.6 liters of peritoneal fluid. Performed by Lynnette Caffey, PA-C Electronically Signed   By: Irish Lack M.D.   On: 10/20/2023 13:20   Mr. McClendon was interviewed and examined.  His wife was at the bedside when I saw him at approximately 6:45 AM.  He reports tolerating a liquid diet without emesis.  Pain is controlled with hydromorphone. He is now at day 13 following the last cycle of chemotherapy.  The white count and platelets are recovering.  He is followed by gastroenterology and is scheduled for an EGD tomorrow.  I discussed  nutrition issues with Mr. Garey Ham and his wife.  I do not recommend placement of a feeding tube or intravenous nutrition, especially since he is tolerating a liquid diet.  They understand he has an incurable malignancy.  I recommend continuing goals of care discussions.  Recommendations: Evaluation for gastric outlet obstruction per GI Continue liquid diet Heparin, transition to apixaban after the GI procedure Continue Dilaudid as needed for pain Discontinue antibiotics tomorrow if he remains afebrile and the neutrophil count is adequate Goals of care discussion/ACP document Nystatin or Diflucan for progressive thrush

## 2023-10-30 NOTE — Progress Notes (Signed)
 Progress Note   Patient: [name] WUJ:[id number] DOB: May 28, 1961 DOA: 10/23/2023     5 DOS: the patient was seen and examined on 10/30/2023   Brief hospital course:  63 year old male with gastric cancer who is undergoing chemotherapy. He has chronic issues with nausea vomiting and abdominal pain however, he presented due to feeling worse over the past few days. He states that he was having episodes of intractable nausea and vomiting and had been unable to tolerate liquids and solids.  Workup on admission was significant for neutropenia.  CT scan revealed dilated loops of jejunum suggesting ileitis.  Some focal thickening of the transverse colon was also reported.  Moderate to large ascites were noted secondary to peritoneal carcinomatosis.  Status post paracentesis.  Assessment and Plan: # Abdominal pain, nausea and vomiting secondary to metastatic gastric cancer with malignant ascites: GI consult note appreciated.  Oncology also following.  CT scan confirmed findings of small bowel ileus. Patient reports one episode vomiting this morning, also had a BM this morning.    Status post abdominal paracentesis. GI planning EGD once anc gets to 1.5, it's 1.2 today  #Moderate to large ascites with findings of peritoneal enhancement, consistent with peritoneal carcinomatosis  #Severe neutropenia with fever: Most likely chemotherapy-induced.  Seen and evaluated by oncology.  Patient being challenged with G-CSF.  Patient was empirically covered with vancomycin, cefepime and Flagyl.  Workup including blood cultures, urinalysis and chest x-ray has thus far been negative. Wbc improved today to 3.6 and 1.2. oncology following, defer stopping abx to them  # history DVT: currently on heparin pending egd  #Cachexia secondary to advanced stage IV gastric cancer disease.  Concerns for gastric outlet obstruction versus dysfunctional stomach related to tumor.  GI recommends supportive care at this time.   Patient has been consented for EGD but procedure was limited due to his severe neutropenia.  Target ANC greater than 1500.  Not a good candidate for gastrostomy tube due to carcinomatosis and ascites.  #Thrombocytopenia: Most likely chemotherapy induced.  #Protein energy malnutrition: BMI index of 16.7.  Patient has poor oral intake due to gastric mass.  Limitations with gastrostomy tube placement due to peritoneal carcinomatosis.     Subjective: reports mild nausea, one bm this morning, one small volume nbnb emesis earlier today  Physical Exam: Vitals:   10/29/23 1047 10/29/23 1534 10/29/23 1948 10/30/23 0452  BP: 109/82 96/74 105/83 95/65  Pulse: 96 93 96 94  Resp: 14 16 18 18   Temp: 99.3 F (37.4 C) 98.6 F (37 C) 97.7 F (36.5 C) 98.8 F (37.1 C)  TempSrc: Oral  Oral Oral  SpO2: 100% 100% 98% 100%  Weight:      Height:       Patient is a cachectic looking gentleman.  NAD HEENT: atraumatic CTAB Cardiovascular: S1-S2 Abdomen: Soft , mild diffuse tenderness Extremities: Thin no pedal edema CNS moving all 4  Data Reviewed:  IMPRESSION: 1. Focally dilated loop of jejunum in the left upper quadrant without obvious evidence of obstruction. Findings may represent possible ileus. 2. A focal thickening of the walls of the mid transverse colon, which may be reactive versus other infectious or inflammatory process. 3. Bowel wall thickening involving the gastric antrum and pylorus, similar in appearance to the prior exam. 4. Moderate-to-large ascites with peritoneal enhancement and omental caking, suspicious for peritoneal carcinomatosis. 5. Aortic atherosclerosis. 6. Remaining incidental findings as described above.  Family Communication: Family not at bedside.    Disposition: Status is:  Inpatient Remains inpatient appropriate because: Pending EGD  Planned Discharge Destination: Home      Author: Silvano Bilis, MD 10/30/2023 11:07 AM  For on call review  [email or web address].

## 2023-10-30 NOTE — Progress Notes (Signed)
     [facility] Progress Note  CC:  Nausea and vomiting; metastatic gastric cancer  Subjective:  Feels ok.  Says that he felt better yesterday than he does today.  Keeps getting a lot of indigestion.  Objective:  Vital signs in last 24 hours: Temp:  [97.7 F (36.5 C)-98.8 F (37.1 C)] 98.8 F (37.1 C) (03/24 0452) Pulse Rate:  [93-96] 94 (03/24 0452) Resp:  [16-18] 18 (03/24 0452) BP: (95-105)/(65-83) 95/65 (03/24 0452) SpO2:  [98 %-100 %] 100 % (03/24 0452) Last BM Date : 10/27/23 General:  Alert, thin, in NAD Heart:  Regular rate and rhythm; no murmurs Pulm:  CTAB.  No W/R/R. Abdomen:  Soft, non-distended.  BS present.  Non-tender. Extremities:  Without edema. Neurologic:  Alert and oriented x 4;  grossly normal neurologically. Psych:  Alert and cooperative. Normal mood and affect.  Intake/Output from previous day: 03/23 0701 - 03/24 0700 In: 735 [IV Piggyback:735] Out: 300 [Urine:300]  Lab Results: Recent Labs    10/28/23 0235 10/29/23 0403 10/30/23 0258  WBC 1.1* 1.4* 3.6*  HGB 9.9* 9.8* 9.7*  HCT 30.6* 30.7* 29.6*  PLT 112* 130* 148*   BMET Recent Labs    10/29/23 0403  NA 130*  K 3.7  CL 101  CO2 20*  GLUCOSE 78  BUN 8  CREATININE 0.71  CALCIUM 8.3*   LFT Recent Labs    10/29/23 0403  PROT 4.9*  ALBUMIN 2.0*  AST 11*  ALT 9  ALKPHOS 60  BILITOT 0.9   Assessment / Plan: Metastatic gastric cancer w/ carcinomatosis and ascites   Persistent vomiting ? gastric outlet obstruction vs dysfunctional stomach related to tumor or ascites - did not respond to most recent paracentesis 1.2 L 3/20   Febrile neutropenia (did receive G-CSF) - Dr. Truett Perna expects counts to recover. WBC moved up to 3.6 today and ANC is 1.2.   Anticoagulant therapy (currently heparin) for DVT  *Continue supportive care at this time. *Neutropenia limits ability to do EGD - once ANC >1500 will consider EGD to see if anatomy shows stricture that could be  stented to relieve gastric outlet obstruction symptoms.  Would need to hold anti-coagulant, IV heparin for 4-6 hours prior to procedure.  Anticipate that we may be able to put this on for tomorrow, 3/25, with dramatic increase in his numbers since yesterday.  Will await input from Dr. [name] and then would message pharmacy about heparin. *Ascites and carcinomatosis prevents gastrostomy.  *Do not think TPN a good option at this time and in general try to avoid in cancer patients.    LOS: 5 days   Princella Pellegrini. Jearline Hirschhorn  10/30/2023, 11:32 AM

## 2023-10-30 NOTE — Plan of Care (Signed)
  Problem: Clinical Measurements: Goal: Will remain free from infection Outcome: Progressing   Problem: Nutrition: Goal: Adequate nutrition will be maintained Outcome: Progressing   Problem: Coping: Goal: Level of anxiety will decrease Outcome: Progressing   Problem: Elimination: Goal: Will not experience complications related to bowel motility Outcome: Progressing Goal: Will not experience complications related to urinary retention Outcome: Progressing   Problem: Pain Managment: Goal: General experience of comfort will improve and/or be controlled Outcome: Progressing   Problem: Safety: Goal: Ability to remain free from injury will improve Outcome: Progressing

## 2023-10-31 ENCOUNTER — Inpatient Hospital Stay (HOSPITAL_COMMUNITY): Admitting: Anesthesiology

## 2023-10-31 ENCOUNTER — Encounter (HOSPITAL_COMMUNITY)
Admission: EM | Disposition: A | Discharge status: Home / Self Care | Payer: Self-pay | Source: Ambulatory Visit | Attending: Family Medicine

## 2023-10-31 ENCOUNTER — Encounter (HOSPITAL_COMMUNITY): Payer: Self-pay | Admitting: Family Medicine

## 2023-10-31 DIAGNOSIS — R109 Unspecified abdominal pain: Secondary | ICD-10-CM | POA: Diagnosis not present

## 2023-10-31 DIAGNOSIS — T183XXA Foreign body in small intestine, initial encounter: Secondary | ICD-10-CM

## 2023-10-31 DIAGNOSIS — F17210 Nicotine dependence, cigarettes, uncomplicated: Secondary | ICD-10-CM

## 2023-10-31 DIAGNOSIS — C169 Malignant neoplasm of stomach, unspecified: Secondary | ICD-10-CM

## 2023-10-31 DIAGNOSIS — T182XXA Foreign body in stomach, initial encounter: Secondary | ICD-10-CM | POA: Diagnosis not present

## 2023-10-31 DIAGNOSIS — I10 Essential (primary) hypertension: Secondary | ICD-10-CM | POA: Diagnosis not present

## 2023-10-31 DIAGNOSIS — R112 Nausea with vomiting, unspecified: Secondary | ICD-10-CM | POA: Diagnosis not present

## 2023-10-31 DIAGNOSIS — C163 Malignant neoplasm of pyloric antrum: Secondary | ICD-10-CM | POA: Diagnosis not present

## 2023-10-31 DIAGNOSIS — K449 Diaphragmatic hernia without obstruction or gangrene: Secondary | ICD-10-CM

## 2023-10-31 DIAGNOSIS — K2100 Gastro-esophageal reflux disease with esophagitis, without bleeding: Secondary | ICD-10-CM

## 2023-10-31 DIAGNOSIS — K5989 Other specified functional intestinal disorders: Secondary | ICD-10-CM | POA: Diagnosis not present

## 2023-10-31 DIAGNOSIS — E785 Hyperlipidemia, unspecified: Secondary | ICD-10-CM | POA: Diagnosis not present

## 2023-10-31 HISTORY — PX: ESOPHAGOGASTRODUODENOSCOPY: SHX5428

## 2023-10-31 HISTORY — PX: BIOPSY OF SKIN SUBCUTANEOUS TISSUE AND/OR MUCOUS MEMBRANE: SHX6741

## 2023-10-31 LAB — CBC WITH DIFFERENTIAL/PLATELET
Abs Immature Granulocytes: 0.4 10*3/uL — ABNORMAL HIGH (ref 0.00–0.07)
Band Neutrophils: 14 %
Basophils Absolute: 0 10*3/uL (ref 0.0–0.1)
Basophils Relative: 0 %
Eosinophils Absolute: 0.1 10*3/uL (ref 0.0–0.5)
Eosinophils Relative: 1 %
HCT: 30.6 % — ABNORMAL LOW (ref 39.0–52.0)
Hemoglobin: 10.1 g/dL — ABNORMAL LOW (ref 13.0–17.0)
Lymphocytes Relative: 13 %
Lymphs Abs: 1.6 10*3/uL (ref 0.7–4.0)
MCH: 28.9 pg (ref 26.0–34.0)
MCHC: 33 g/dL (ref 30.0–36.0)
MCV: 87.4 fL (ref 80.0–100.0)
Monocytes Absolute: 0.9 10*3/uL (ref 0.1–1.0)
Monocytes Relative: 7 %
Myelocytes: 3 %
Neutro Abs: 9.3 10*3/uL — ABNORMAL HIGH (ref 1.7–7.7)
Neutrophils Relative %: 62 %
Platelets: 180 10*3/uL (ref 150–400)
RBC: 3.5 MIL/uL — ABNORMAL LOW (ref 4.22–5.81)
RDW: 16.3 % — ABNORMAL HIGH (ref 11.5–15.5)
WBC: 12.3 10*3/uL — ABNORMAL HIGH (ref 4.0–10.5)
nRBC: 0.5 % — ABNORMAL HIGH (ref 0.0–0.2)

## 2023-10-31 LAB — BASIC METABOLIC PANEL
Anion gap: 8 (ref 5–15)
BUN: 9 mg/dL (ref 6–20)
CO2: 18 mmol/L — ABNORMAL LOW (ref 22–32)
Calcium: 8.2 mg/dL — ABNORMAL LOW (ref 8.9–10.3)
Chloride: 104 mmol/L (ref 98–111)
Creatinine, Ser: 0.58 mg/dL — ABNORMAL LOW (ref 0.61–1.24)
GFR, Estimated: >60 mL/min (ref >60)
Glucose, Bld: 77 mg/dL (ref 70–99)
Potassium: 3.5 mmol/L (ref 3.5–5.1)
Sodium: 130 mmol/L — ABNORMAL LOW (ref 135–145)

## 2023-10-31 LAB — HEPARIN LEVEL (UNFRACTIONATED): Heparin Unfractionated: 0.6 [IU]/mL (ref 0.30–0.70)

## 2023-10-31 SURGERY — EGD (ESOPHAGOGASTRODUODENOSCOPY)
Anesthesia: General

## 2023-10-31 MED ORDER — ESMOLOL HCL 100 MG/10ML IV SOLN
INTRAVENOUS | Status: DC | PRN
  Administered 2023-10-31: 40 mg via INTRAVENOUS

## 2023-10-31 MED ORDER — PROPOFOL 10 MG/ML IV BOLUS
INTRAVENOUS | Status: DC | PRN
Start: 2023-10-31 — End: 2023-10-31
  Administered 2023-10-31 (×3): 20 mg via INTRAVENOUS
  Administered 2023-10-31: 50 mg via INTRAVENOUS

## 2023-10-31 MED ORDER — HEPARIN (PORCINE) 25000 UT/250ML-% IV SOLN
1150.0000 [IU]/h | INTRAVENOUS | Status: AC
  Administered 2023-11-01: 1150 [IU]/h via INTRAVENOUS
  Filled 2023-10-31: qty 250

## 2023-10-31 MED ORDER — PROPOFOL 500 MG/50ML IV EMUL
INTRAVENOUS | Status: DC | PRN
  Administered 2023-10-31: 100 ug/kg/min via INTRAVENOUS

## 2023-10-31 MED ORDER — HEPARIN (PORCINE) 25000 UT/250ML-% IV SOLN: 1150.0000 [IU]/h | INTRAVENOUS | Status: DC

## 2023-10-31 MED ORDER — SODIUM CHLORIDE 0.9 % IV SOLN: INTRAVENOUS | Status: DC | PRN

## 2023-10-31 MED ORDER — LIDOCAINE 2% (20 MG/ML) 5 ML SYRINGE
INTRAMUSCULAR | Status: DC | PRN
Start: 2023-10-31 — End: 2023-10-31
  Administered 2023-10-31: 50 mg via INTRAVENOUS

## 2023-10-31 NOTE — Hospital Course (Addendum)
 63 year old male with gastric cancer who is undergoing chemotherapy. He has chronic issues with nausea vomiting and abdominal pain however, he presented due to feeling worse over the past few days. He states that he was having episodes of intractable nausea and vomiting and had been unable to tolerate liquids and solids.  Workup on admission was significant for neutropenia.  CT scan revealed dilated loops of jejunum suggesting ileitis.  Some focal thickening of the transverse colon was also reported.  Moderate to large ascites were noted secondary to peritoneal carcinomatosis.  Status post paracentesis.   Assessment and Plan: Abdominal pain - multifactorial from gastric cancer, colonic wall thickening, ascites with peritoneal enhancement and omental caking (suspicious for peritoneal carcinomatosis) - focus should be pain control  -EGD performed on 10/31/2023 with no acute findings; LA grade D esophagitis with no bleeding noted; diffuse prominent friable erythematous gastric folds; no evidence of gastric outlet obstruction nor any role for stenting -Patient has been counseled to slowly advance diet; he is also continued on scheduled Reglan and also has other antiemetics at home along with [name] which was recently started by oncology  Ascites  - with findings of peritoneal enhancement, consistent with peritoneal carcinomatosis -Underwent paracentesis on 10/26/2023 removing 1.2 L fluid -Anticipate will need serial paracenteses in the future   Severe neutropenia with fever - Most likely chemotherapy-induced.  Seen and evaluated by oncology.   -Treated with empiric antibiotics and ANC has improved back to normal - Okay to discontinue antibiotics  History DVT: Resume Eliquis at discharge   Cachexia secondary to advanced stage IV gastric cancer disease -Underwent EGD on 10/31/2023; no evidence of gastric outlet obstruction noted -Resume diet after EGD -Not a candidate for PEG placement due to  peritoneal carcinomatosis -Continued on [name] - Followed closely by oncology; may need to consider ultimate transition to hospice if he continues to have progressive decline   Thrombocytopenia: Most likely chemotherapy induced.   Protein energy malnutrition: BMI index of 16.7.  Patient has poor oral intake due to gastric mass.  Limitations with gastrostomy tube placement due to peritoneal carcinomatosis. Patient understands to advance diet as able

## 2023-10-31 NOTE — Progress Notes (Signed)
 PHARMACY - ANTICOAGULATION CONSULT NOTE  Pharmacy Consult for heparin Indication:  h/o renal infarct and h/o DVT  (PTA Eliquis on hold)  Allergies  Allergen Reactions   Pravastatin Itching   Simvastatin Itching    Patient Measurements: Height: 6\' 1"  (185.4 cm) Weight: 57.7 kg (127 lb 1.6 oz) IBW/kg (Calculated) : 79.9 Heparin Dosing Weight: 57.7 kg  Vital Signs: Temp: 98.3 F (36.8 C) (03/25 0522) Temp Source: Oral (03/25 0522) BP: 129/83 (03/25 0522) Pulse Rate: 104 (03/25 0522)  Labs: Recent Labs    10/29/23 0403 10/29/23 1150 10/29/23 1751 10/30/23 0258 10/31/23 0238  HGB 9.8*  --   --  9.7* 10.1*  HCT 30.7*  --   --  29.6* 30.6*  PLT 130*  --   --  148* 180  HEPARINUNFRC 0.19*   < > 0.51 0.46 0.60  CREATININE 0.71  --   --   --  0.58*   < > = values in this interval not displayed.    Estimated Creatinine Clearance: 80.1 mL/min (A) (by C-G formula based on SCr of 0.58 mg/dL (L)).   Assessment: Patient is a 63 y.o. male with hx HTN, PAD, renal infarct, stage IV gastric cancer on chemo, and LE DVT in 2008 on Eliquis PTA who presented to the ED on 10/23/23 with c/o severe nausea and vomiting. Pt received last dose (cycle 5) of FOLFOX/nivolumab on 10/18/23. He reported nausea with recurrent bouts of vomiting since 10/20/23, which couldn't be improved by Protonix, Carafate, and [name] at home. Anticoagulation changed to heparin due to poor oral intake secondary to nausea and vomiting and in case invasive intervention is needed. - Last dose Eliquis was taken on 3/17 at 10am.   Today, 10/31/2023: - Heparin level remains therapeutic at 0.60 on 1150 units/hr - Hgb 10.1, plts WNL at 180 - No complications reported by RN - GI plans to do EGD at 12:30 pm on 3/25 given ANC 9300  Goal of Therapy:  Heparin level 0.3-0.7 units/ml Monitor platelets by anticoagulation protocol: Yes   Plan:  - Per discussion with Dr. [name] via secure chat, hold heparin drip at 8:30 am in  anticipation for procedure  - GI team: please advise if/when heparin drip can be resumed after EGD procedure - Daily CBC & heparin level - Monitor for s/sx bleeding   Thank you for allowing pharmacy to be a part of this patient's care.  Tory Emerald, PharmD Candidate 10/31/2023 7:12 AM

## 2023-10-31 NOTE — Transfer of Care (Signed)
 Immediate Anesthesia Transfer of Care Note  Patient: [name]  Procedure(s) Performed: EGD (ESOPHAGOGASTRODUODENOSCOPY) BIOPSY, SKIN, SUBCUTANEOUS TISSUE, OR MUCOUS MEMBRANE  Patient Location: PACU  Anesthesia Type:MAC  Level of Consciousness: awake, alert , oriented, and patient cooperative  Airway & Oxygen Therapy: Patient Spontanous Breathing and Patient connected to face mask oxygen  Post-op Assessment: Report given to RN and Post -op Vital signs reviewed and stable  Post vital signs: Reviewed and stable  Last Vitals:  Vitals Value Taken Time  BP    Temp    Pulse 93 10/31/23 1255  Resp 24 10/31/23 1255  SpO2 100 % 10/31/23 1255  Vitals shown include unfiled device data.  Last Pain:  Vitals:   10/31/23 1200  TempSrc: Temporal  PainSc: 0-No pain      Patients Stated Pain Goal: 2 (10/28/23 1455)  Complications: No notable events documented.

## 2023-10-31 NOTE — Op Note (Signed)
 [facility] Patient Name: [name] Procedure Date: 10/31/2023 MRN: [id number] Attending MD: Dub Amis. [name] , MD, [id number] Date of Birth: 1960-10-16 CSN: [id number] Age: 63 Admit Type: Inpatient Procedure:                Upper GI endoscopy Indications:              Nausea with vomiting; recently diagnosed                            infiltrating signet ring gastric adenocarcinoma,                            with PO intolerance, severe nausea/vomiting. Repeat                            EGD to assess for gastric outlet obstruction/lesion                            amenable to stenting Providers:                Lorin Picket E. [name], MD, Rogue Jury, RN, Alan Ripper, Technician Referring MD:              Medicines:                Monitored Anesthesia Care Complications:            No immediate complications. Estimated Blood Loss:     Estimated blood loss was minimal. Procedure:                Pre-Anesthesia Assessment:                           - Prior to the procedure, a History and Physical                            was performed, and patient medications and                            allergies were reviewed. The patient's tolerance of                            previous anesthesia was also reviewed. The risks                            and benefits of the procedure and the sedation                            options and risks were discussed with the patient.                            All questions were answered, and informed consent                            was obtained.  Prior Anticoagulants: The patient has                            taken heparin (heparin drip), last dose was day of                            procedure, stopped 4 hours prior). ASA Grade                            Assessment: III - A patient with severe systemic                            disease. After reviewing the risks and benefits,                             the patient was deemed in satisfactory condition to                            undergo the procedure.                           After obtaining informed consent, the endoscope was                            passed under direct vision. Throughout the                            procedure, the patient's blood pressure, pulse, and                            oxygen saturations were monitored continuously. The                            [id number]-[id number] ([id number]) Olympus endoscope was introduced                            through the mouth, and advanced to the second part                            of duodenum. The upper GI endoscopy was                            accomplished without difficulty. The patient                            tolerated the procedure well. Scope In: Scope Out: Findings:      The examined portions of the nasopharynx, oropharynx and larynx were       normal.      LA Grade D (one or more mucosal breaks involving at least 75% of       esophageal circumference) esophagitis with no bleeding was found in the       mid and distal esophagus. Biopsies were taken with a cold forceps for       histology. Estimated blood loss was minimal.  The exam of the esophagus was otherwise normal.      A small hiatal hernia was present.      Diffuse prominent, friable erythematous gastric folds were found in the       gastric antrum, primarily along the lesser curvature/posterior wall,       consistent with known adenocarcinoma. There was mild resistance of       passage of the scope through the pylorus the first time, but subsequent       pyloric intubations were met with minimal resistance.      The exam of the stomach was otherwise normal.      Thick bilious fluid was found in the gastric body.      Copious bilious fluid was found in the second portion of the duodenum       and copious refluxate from distal portions of the duodenum into the       second portion were noted during the  exam.      The duodenum appeared diffusely dilated but was otherwise normal. Impression:               - The examined portions of the nasopharynx,                            oropharynx and larynx were normal.                           - LA Grade D reflux esophagitis with no bleeding.                            Biopsied to exclude infection.                           - Small hiatal hernia.                           - Enlarged gastric folds.                           - Bilious gastric fluid.                           - Retained food in the duodenum.                           - No evidence of gastric outlet obstruction, and no                            lesion amenable to stenting. Patient's symptoms                            likely combination of ileus (as demonstrated on CT                            and dilated duodenum on endoscopy today), and                            impaired gastric relaxation/accomodation due to  malignancy. No role for stenting Moderate Sedation:      Not Applicable - Patient had care per Anesthesia. Recommendation:           - Return patient to hospital ward for ongoing care.                           - Resume previous diet.                           - Resume heparin at prior dose today. Would                            recommend waiting 2 hours following esophageal                            biopsies.                           - Await pathology results.                           - Continue medical management of functional                            ileus/nausea/vomiting with scheduled Reglan.                            Addition of Relistor may provide some improvement                            in symptoms. Procedure Code(s):        --- Professional ---                           [id number], Esophagogastroduodenoscopy, flexible,                            transoral; with biopsy, single or multiple Diagnosis Code(s):        --- Professional  ---                           K21.00, Gastro-esophageal reflux disease with                            esophagitis, without bleeding                           K44.9, Diaphragmatic hernia without obstruction or                            gangrene                           K29.60, Other gastritis without bleeding                           R11.2, Nausea with vomiting, unspecified CPT copyright 2022 American Medical Association. All rights reserved.  The codes documented in this report are preliminary and upon coder review may  be revised to meet current compliance requirements. Casidy Alberta E. [name], MD 10/31/2023 1:11:35 PM This report has been signed electronically. Number of Addenda: 0

## 2023-10-31 NOTE — Anesthesia Preprocedure Evaluation (Addendum)
 Anesthesia Evaluation  Patient identified by MRN, date of birth, ID band Patient awake    Reviewed: Allergy & Precautions, NPO status , Patient's Chart, lab work & pertinent test results  History of Anesthesia Complications Negative for: history of anesthetic complications  Airway Mallampati: II  TM Distance: >3 FB Neck ROM: Full    Dental  (+) Dental Advisory Given   Pulmonary Current Smoker and Patient abstained from smoking.   Pulmonary exam normal        Cardiovascular hypertension, + Peripheral Vascular Disease and + DVT   Rhythm:Regular Rate:Tachycardia   '23 Myoperfusion - The study is normal. The study is low risk.   No ST deviation was noted.   LV perfusion is normal. There is no evidence of ischemia. There is no evidence of infarction.   Left ventricular function is normal. Nuclear stress EF: 63 %. The left ventricular ejection fraction is normal (55-65%). End diastolic cavity size is normal. End systolic cavity size is normal.    Neuro/Psych negative neurological ROS  negative psych ROS   GI/Hepatic ,GERD  Medicated and Controlled,,(+)   ascites  substance abuse  alcohol use and marijuana use Gastric cancer Intractable N/V     Endo/Other   Na 130   Renal/GU negative Renal ROS     Musculoskeletal  (+) Arthritis ,  narcotic dependent  Abdominal   Peds  Hematology  (+) Blood dyscrasia, anemia  On eliquis    Anesthesia Other Findings   Reproductive/Obstetrics                             Anesthesia Physical Anesthesia Plan  ASA: 3  Anesthesia Plan: MAC   Post-op Pain Management: Minimal or no pain anticipated   Induction:   PONV Risk Score and Plan: 1 and Treatment may vary due to age or medical condition and Propofol infusion  Airway Management Planned: Natural Airway and Nasal Cannula  Additional Equipment: None  Intra-op Plan:   Post-operative Plan:    Informed Consent: I have reviewed the patients History and Physical, chart, labs and discussed the procedure including the risks, benefits and alternatives for the proposed anesthesia with the patient or authorized representative who has indicated his/her understanding and acceptance.       Plan Discussed with: CRNA and Anesthesiologist  Anesthesia Plan Comments:        Anesthesia Quick Evaluation

## 2023-10-31 NOTE — Anesthesia Postprocedure Evaluation (Signed)
 Anesthesia Post Note  Patient: [name]  Procedure(s) Performed: EGD (ESOPHAGOGASTRODUODENOSCOPY) BIOPSY, SKIN, SUBCUTANEOUS TISSUE, OR MUCOUS MEMBRANE     Patient location during evaluation: PACU Anesthesia Type: MAC Level of consciousness: awake and alert Pain management: pain level controlled Vital Signs Assessment: post-procedure vital signs reviewed and stable Respiratory status: spontaneous breathing, nonlabored ventilation and respiratory function stable Cardiovascular status: stable Anesthetic complications: no  No notable events documented.  Last Vitals:  Vitals:   10/31/23 1357 10/31/23 1408  BP: (!) 83/60 (!) 84/64  Pulse: 86   Resp: 16   Temp: 36.6 C   SpO2: 98%                     Beryle Lathe

## 2023-10-31 NOTE — Progress Notes (Signed)
 Pharmacy: [facility]   Patient underwent EGD around 12:30p. He was noted to have esophagitis with no bleeding. Biopsies taken.   Dr. [name] recom to resume heparin drip about 2 hours after procedure.  Plan: - Will resume heparin drip rate at previous rate of 1150 units/hr at 3p.  Dorna Leitz, PharmD, BCPS 10/31/2023 2:11 PM

## 2023-10-31 NOTE — Progress Notes (Signed)
 Progress Note    [name]   WUJ:[id number]  DOB: 10/23/1960  DOA: 10/23/2023     6 PCP: Wanda Plump, MD  Initial CC: abdominal pain  Hospital Course: 62 year old male with gastric cancer who is undergoing chemotherapy. He has chronic issues with nausea vomiting and abdominal pain however, he presented due to feeling worse over the past few days. He states that he was having episodes of intractable nausea and vomiting and had been unable to tolerate liquids and solids.  Workup on admission was significant for neutropenia.  CT scan revealed dilated loops of jejunum suggesting ileitis.  Some focal thickening of the transverse colon was also reported.  Moderate to large ascites were noted secondary to peritoneal carcinomatosis.  Status post paracentesis.   Assessment and Plan: Abdominal pain - multifactorial from gastric cancer, colonic wall thickening, ascites with peritoneal enhancement and omental caking (suspicious for peritoneal carcinomatosis) - focus should be pain control   Aascites  - with findings of peritoneal enhancement, consistent with peritoneal carcinomatosis -Underwent paracentesis on 10/26/2023 removing 1.2 L fluid   Severe neutropenia with fever - Most likely chemotherapy-induced.  Seen and evaluated by oncology.   -Treated with empiric antibiotics and ANC has improved back to normal - Okay to discontinue antibiotics  History DVT: continue heparin drip post EGD   Cachexia secondary to advanced stage IV gastric cancer disease -Underwent EGD on 10/31/2023; no evidence of gastric outlet obstruction noted -Resume diet after EGD - If unable to maintain adequate nutrition, may need to consider PEG tube placement if wishing to remain aggressive with care (may be contraindicated with carcinomatosis)   Thrombocytopenia: Most likely chemotherapy induced.   Protein energy malnutrition: BMI index of 16.7.  Patient has poor oral intake due to gastric mass.  Limitations  with gastrostomy tube placement due to peritoneal carcinomatosis.  Interval History:  No events overnight.  Resting comfortably when seen this morning.  Undergoing EGD later today when seen this morning as well.   Old records reviewed in assessment of this patient  Antimicrobials:   DVT prophylaxis:  heparin drip   Code Status:   Code Status: Full Code  Mobility Assessment (Last 72 Hours)     Mobility Assessment     Row Name 10/31/23 0750 10/30/23 2136 10/30/23 0900 10/29/23 1958 10/29/23 1300   Does patient have an order for bedrest or is patient medically unstable No - Continue assessment No - Continue assessment No - Continue assessment No - Continue assessment No - Continue assessment   What is the highest level of mobility based on the progressive mobility assessment? Level 5 (Walks with assist in room/hall) - Balance while stepping forward/back and can walk in room with assist - Complete Level 5 (Walks with assist in room/hall) - Balance while stepping forward/back and can walk in room with assist - Complete Level 5 (Walks with assist in room/hall) - Balance while stepping forward/back and can walk in room with assist - Complete Level 5 (Walks with assist in room/hall) - Balance while stepping forward/back and can walk in room with assist - Complete Level 5 (Walks with assist in room/hall) - Balance while stepping forward/back and can walk in room with assist - Complete   Is the above level different from baseline mobility prior to current illness? Yes - Recommend PT order Yes - Recommend PT order -- Yes - Recommend PT order --    Row Name 10/29/23 1100 10/28/23 2000  Does patient have an order for bedrest or is patient medically unstable No - Continue assessment No - Continue assessment      What is the highest level of mobility based on the progressive mobility assessment? Level 5 (Walks with assist in room/hall) - Balance while stepping forward/back and can walk in room  with assist - Complete Level 5 (Walks with assist in room/hall) - Balance while stepping forward/back and can walk in room with assist - Complete      Is the above level different from baseline mobility prior to current illness? Yes - Recommend PT order Yes - Recommend PT order               Barriers to discharge: none Disposition Plan:  Home  HH orders placed:  Status is: Inpt   Objective: Blood pressure (!) 84/64, pulse 86, temperature 97.8 F (36.6 C), resp. rate 16, height 6\' 1"  (1.854 m), weight 57.7 kg, SpO2 98%.  Examination:  Physical Exam Constitutional:      General: He is not in acute distress.    Appearance: Normal appearance.  HENT:     Head: Normocephalic and atraumatic.     Mouth/Throat:     Mouth: Mucous membranes are moist.  Eyes:     Extraocular Movements: Extraocular movements intact.  Cardiovascular:     Rate and Rhythm: Normal rate and regular rhythm.  Pulmonary:     Effort: Pulmonary effort is normal. No respiratory distress.     Breath sounds: Normal breath sounds. No wheezing.  Abdominal:     General: Bowel sounds are normal. There is no distension.     Palpations: Abdomen is soft.     Tenderness: There is no abdominal tenderness.  Musculoskeletal:        General: Normal range of motion.     Cervical back: Normal range of motion and neck supple.  Skin:    General: Skin is warm and dry.  Neurological:     General: No focal deficit present.     Mental Status: He is alert.  Psychiatric:        Mood and Affect: Mood normal.        Behavior: Behavior normal.      Consultants:  GI  Procedures:  3/25: EGD  Data Reviewed: Results for orders placed or performed during the hospital encounter of 10/23/23 (from the past 24 hours)  Heparin level (unfractionated)     Status: None   Collection Time: 10/31/23  2:38 AM  Result Value Ref Range   Heparin Unfractionated 0.60 0.30 - 0.70 IU/mL  CBC with Differential/Platelet     Status: Abnormal    Collection Time: 10/31/23  2:38 AM  Result Value Ref Range   WBC 12.3 (H) 4.0 - 10.5 K/uL   RBC 3.50 (L) 4.22 - 5.81 MIL/uL   Hemoglobin 10.1 (L) 13.0 - 17.0 g/dL   HCT 69.6 (L) 29.5 - 28.4 %   MCV 87.4 80.0 - 100.0 fL   MCH 28.9 26.0 - 34.0 pg   MCHC 33.0 30.0 - 36.0 g/dL   RDW 13.2 (H) 44.0 - 10.2 %   Platelets 180 150 - 400 K/uL   nRBC 0.5 (H) 0.0 - 0.2 %   Neutrophils Relative % 62 %   Neutro Abs 9.3 (H) 1.7 - 7.7 K/uL   Band Neutrophils 14 %   Lymphocytes Relative 13 %   Lymphs Abs 1.6 0.7 - 4.0 K/uL   Monocytes Relative 7 %   Monocytes  Absolute 0.9 0.1 - 1.0 K/uL   Eosinophils Relative 1 %   Eosinophils Absolute 0.1 0.0 - 0.5 K/uL   Basophils Relative 0 %   Basophils Absolute 0.0 0.0 - 0.1 K/uL   WBC Morphology DOHLE BODIES    Smear Review MORPHOLOGY UNREMARKABLE    Myelocytes 3 %   Abs Immature Granulocytes 0.40 (H) 0.00 - 0.07 K/uL   Dohle Bodies PRESENT    Burr Cells PRESENT   Basic metabolic panel     Status: Abnormal   Collection Time: 10/31/23  2:38 AM  Result Value Ref Range   Sodium 130 (L) 135 - 145 mmol/L   Potassium 3.5 3.5 - 5.1 mmol/L   Chloride 104 98 - 111 mmol/L   CO2 18 (L) 22 - 32 mmol/L   Glucose, Bld 77 70 - 99 mg/dL   BUN 9 6 - 20 mg/dL   Creatinine, Ser 1.61 (L) 0.61 - 1.24 mg/dL   Calcium 8.2 (L) 8.9 - 10.3 mg/dL   GFR, Estimated >09 >60 mL/min   Anion gap 8 5 - 15    I have reviewed pertinent nursing notes, vitals, labs, and images as necessary. I have ordered labwork to follow up on as indicated.  I have reviewed the last notes from staff over past 24 hours. I have discussed patient's care plan and test results with nursing staff, CM/SW, and other staff as appropriate.  Time spent: Greater than 50% of the 55 minute visit was spent in counseling/coordination of care for the patient as laid out in the A&P.   LOS: 6 days   Lewie Chamber, MD [facility] 10/31/2023, 5:31 PM

## 2023-10-31 NOTE — Plan of Care (Signed)
  Problem: Clinical Measurements: Goal: Ability to maintain clinical measurements within normal limits will improve Outcome: Progressing Goal: Diagnostic test results will improve Outcome: Progressing Goal: Respiratory complications will improve Outcome: Progressing Goal: Cardiovascular complication will be avoided Outcome: Progressing   Problem: Pain Managment: Goal: General experience of comfort will improve and/or be controlled Outcome: Progressing

## 2023-10-31 NOTE — Interval H&P Note (Signed)
 History and Physical Interval Note:  10/31/2023 12:35 PM  [name]  has presented today for surgery, with the diagnosis of Nausea and vomiting; gastric cancer.  The various methods of treatment have been discussed with the patient and family. After consideration of risks, benefits and other options for treatment, the patient has consented to  Procedure(s): EGD (ESOPHAGOGASTRODUODENOSCOPY) (N/A) as a surgical intervention.  The patient's history has been reviewed, patient examined, no change in status, stable for surgery.  I have reviewed the patient's chart and labs.  Questions were answered to the patient's satisfaction.     ANC markedly improved today, proceeding with EGD  Jenel Lucks

## 2023-11-01 ENCOUNTER — Inpatient Hospital Stay: Payer: 59

## 2023-11-01 ENCOUNTER — Encounter: Payer: Self-pay | Admitting: Nutrition

## 2023-11-01 ENCOUNTER — Inpatient Hospital Stay: Admitting: Nutrition

## 2023-11-01 ENCOUNTER — Telehealth: Payer: Self-pay

## 2023-11-01 ENCOUNTER — Inpatient Hospital Stay: Payer: 59 | Admitting: Nurse Practitioner

## 2023-11-01 DIAGNOSIS — C800 Disseminated malignant neoplasm, unspecified: Secondary | ICD-10-CM | POA: Diagnosis not present

## 2023-11-01 DIAGNOSIS — C163 Malignant neoplasm of pyloric antrum: Secondary | ICD-10-CM | POA: Diagnosis not present

## 2023-11-01 DIAGNOSIS — R109 Unspecified abdominal pain: Secondary | ICD-10-CM | POA: Diagnosis not present

## 2023-11-01 DIAGNOSIS — R112 Nausea with vomiting, unspecified: Secondary | ICD-10-CM | POA: Diagnosis not present

## 2023-11-01 LAB — CBC WITH DIFFERENTIAL/PLATELET
Abs Immature Granulocytes: 4.71 10*3/uL — ABNORMAL HIGH (ref 0.00–0.07)
Basophils Absolute: 0 10*3/uL (ref 0.0–0.1)
Basophils Relative: 0 %
Eosinophils Absolute: 0 10*3/uL (ref 0.0–0.5)
Eosinophils Relative: 0 %
HCT: 31.3 % — ABNORMAL LOW (ref 39.0–52.0)
Hemoglobin: 10.1 g/dL — ABNORMAL LOW (ref 13.0–17.0)
Immature Granulocytes: 18 %
Lymphocytes Relative: 10 %
Lymphs Abs: 2.6 10*3/uL (ref 0.7–4.0)
MCH: 28.4 pg (ref 26.0–34.0)
MCHC: 32.3 g/dL (ref 30.0–36.0)
MCV: 87.9 fL (ref 80.0–100.0)
Monocytes Absolute: 2.8 10*3/uL — ABNORMAL HIGH (ref 0.1–1.0)
Monocytes Relative: 11 %
Neutro Abs: 16.1 10*3/uL — ABNORMAL HIGH (ref 1.7–7.7)
Neutrophils Relative %: 61 %
Platelets: 195 10*3/uL (ref 150–400)
RBC: 3.56 MIL/uL — ABNORMAL LOW (ref 4.22–5.81)
RDW: 16.6 % — ABNORMAL HIGH (ref 11.5–15.5)
Smear Review: NORMAL
WBC: 26.3 10*3/uL — ABNORMAL HIGH (ref 4.0–10.5)
nRBC: 0.5 % — ABNORMAL HIGH (ref 0.0–0.2)

## 2023-11-01 LAB — BASIC METABOLIC PANEL
Anion gap: 7 (ref 5–15)
BUN: 10 mg/dL (ref 6–20)
CO2: 20 mmol/L — ABNORMAL LOW (ref 22–32)
Calcium: 8.1 mg/dL — ABNORMAL LOW (ref 8.9–10.3)
Chloride: 102 mmol/L (ref 98–111)
Creatinine, Ser: 0.64 mg/dL (ref 0.61–1.24)
GFR, Estimated: >60 mL/min (ref >60)
Glucose, Bld: 83 mg/dL (ref 70–99)
Potassium: 3.5 mmol/L (ref 3.5–5.1)
Sodium: 129 mmol/L — ABNORMAL LOW (ref 135–145)

## 2023-11-01 LAB — HEPARIN LEVEL (UNFRACTIONATED): Heparin Unfractionated: 0.62 [IU]/mL (ref 0.30–0.70)

## 2023-11-01 MED ORDER — HEPARIN SOD (PORK) LOCK FLUSH 100 UNIT/ML IV SOLN
500.0000 [IU] | INTRAVENOUS | Status: AC | PRN
  Administered 2023-11-01: 500 [IU]

## 2023-11-01 MED ORDER — APIXABAN 5 MG PO TABS
5.0000 mg | ORAL_TABLET | Freq: Two times a day (BID) | ORAL | Status: DC
  Administered 2023-11-01: 5 mg via ORAL
  Filled 2023-11-01: qty 1

## 2023-11-01 MED ORDER — PANTOPRAZOLE SODIUM 40 MG IV SOLR: 40.0000 mg | Freq: Two times a day (BID) | INTRAVENOUS | Status: DC

## 2023-11-01 MED ORDER — METOCLOPRAMIDE HCL 10 MG PO TABS: 10.0000 mg | ORAL_TABLET | Freq: Three times a day (TID) | ORAL | 1 refills | Status: DC

## 2023-11-01 NOTE — Progress Notes (Cosign Needed)
     [facility] Progress Note  CC:  Nausea and vomiting; metastatic gastric cancer    Subjective:  Feeling better.  Dr. Frederick Peers was in the room when I saw the patient and patient's wife was on the phone.  Anticipating discharge later today.  He is going to try to eat some lunch.   Objective: Vital signs in last 24 hours: Temp:  [97.6 F (36.4 C)-98.4 F (36.9 C)] 98.4 F (36.9 C) (03/26 0512) Pulse Rate:  [86-104] 96 (03/26 0512) Resp:  [13-28] 16 (03/26 0512) BP: (83-121)/(60-93) 98/82 (03/26 0512) SpO2:  [97 %-100 %] 99 % (03/26 0512) Last BM Date : 10/02/23 General:  Alert, thin, in NAD Heart:  Regular rate and rhythm; no murmurs Pulm:  CTAB.  No W/R/R. Abdomen:  Soft, non-distended.  BS present.  Non-tender. Extremities:  Without edema. Neurologic:  Alert and oriented x 4;  grossly normal neurologically. Psych:  Alert and cooperative. Normal mood and affect.  Intake/Output from previous day: 03/25 0701 - 03/26 0700 In: 324.9 [I.V.:324.9] Out: 505 [Urine:505]  Lab Results: Recent Labs    10/30/23 0258 10/31/23 0238 11/01/23 0439  WBC 3.6* 12.3* 26.3*  HGB 9.7* 10.1* 10.1*  HCT 29.6* 30.6* 31.3*  PLT 148* 180 195   BMET Recent Labs    10/31/23 0238 11/01/23 0439  NA 130* 129*  K 3.5 3.5  CL 104 102  CO2 18* 20*  GLUCOSE 77 83  BUN 9 10  CREATININE 0.58* 0.64  CALCIUM 8.2* 8.1*   Assessment / Plan: Metastatic gastric cancer w/ carcinomatosis and ascites   Persistent vomiting ? gastric outlet obstruction vs dysfunctional stomach related to tumor or ascites - did not respond to most recent paracentesis 1.2 L 3/20.  EGD 3/25:  - LA Grade D reflux esophagitis with no bleeding. Biopsied to exclude infection. - Small hiatal hernia. - Enlarged gastric folds. - Bilious gastric fluid. - Retained food in the duodenum. - No evidence of gastric outlet obstruction, and no lesion amenable to stenting. Patient' s symptoms likely combination of ileus (  as demonstrated on CT and dilated duodenum on endoscopy today) , and impaired gastric relaxation/ accomodation due to malignancy. No role for stenting.   Febrile neutropenia (did receive G-CSF) - resolved.  Now WBC count and ANC are high.   Anticoagulant therapy (currently heparin) for DVT  -Continue PPI.  Currently is only on 40 mg once daily.  If no contraindication, probably would be helpful to increase to twice daily.  Continue other antiemetics including metoclopramide 10 mg every 6 hours scheduled. -?  Addition of Relistor for constipation if needed.  Otherwise continue other bowel regimen.  -Advised that he needs to remain upright for a few hours after eating/ingesting anything.   -Should not just be laying in the bed at home.  Likely will be discharged later today.  -Would probably not advance past soft diet, small frequent meals.   LOS: 7 days   Princella Pellegrini. Iyla Balzarini  11/01/2023, 9:27 AM   I agree with the APP's note, impression and recommendations. Patient was discharged home before I was able to see/examine him.  Scott E. [name], MD [facility]

## 2023-11-01 NOTE — Plan of Care (Signed)
  Problem: Clinical Measurements: Goal: Diagnostic test results will improve Outcome: Progressing   Problem: Nutrition: Goal: Adequate nutrition will be maintained Outcome: Progressing   Problem: Coping: Goal: Level of anxiety will decrease Outcome: Progressing   Problem: Pain Managment: Goal: General experience of comfort will improve and/or be controlled Outcome: Progressing

## 2023-11-01 NOTE — Progress Notes (Signed)
 Patient complained earlier of localized pain at insertion site of huber needle in port. Patient states that provider assessed site prior to my arrival and noted no complications. This VAST RN assessed, found no visibile complications, line flushed easily with good blood return. Discussed possibility of tape tugging on needle/skin.  Needle removed after heparin locked, patient states no more pain. Instructed patient to reach out to provider if any further issues noted. Primary RN informed.

## 2023-11-01 NOTE — Progress Notes (Signed)
 Patient is hospitalized. Nutrition follow up cancelled and rescheduled for April 9 during infusion pending plan of care.

## 2023-11-01 NOTE — Progress Notes (Signed)
 Pharmacy: heparin --> Eliquis  Pharmacy has been consulted to transition back to Eliquis.  Plan: - d/c heparin drip - start Eliquis 5 mg bid  - Pharmacy will sign off for Eliquis but will follow pt peripherally along with you.  Re-consult [place] if further assistance is needed.  Dorna Leitz, PharmD, BCPS 11/01/2023 12:05 PM

## 2023-11-01 NOTE — Telephone Encounter (Signed)
 The patient called to inform us that he has been discharged from the hospital and requires a refill for his Dilaudid prescription. He mentioned that he only has one pill remaining and requested that [facility] process the refill during his visit to the hospital this morning.I submitted the request to the nurse practitioner's desk.

## 2023-11-01 NOTE — Progress Notes (Signed)
 PHARMACY - ANTICOAGULATION CONSULT NOTE  Pharmacy Consult for heparin Indication:  h/o renal infarct and h/o DVT  (PTA Eliquis on hold)  Allergies  Allergen Reactions   Pravastatin Itching   Simvastatin Itching    Patient Measurements: Height: 6\' 1"  (185.4 cm) Weight: 57.7 kg (127 lb 1.6 oz) IBW/kg (Calculated) : 79.9 Heparin Dosing Weight: 57.7 kg  Vital Signs: Temp: 98.4 F (36.9 C) (03/26 0512) Temp Source: Oral (03/26 0512) BP: 98/82 (03/26 0512) Pulse Rate: 96 (03/26 0512)  Labs: Recent Labs    10/30/23 0258 10/31/23 0238 11/01/23 0439  HGB 9.7* 10.1* 10.1*  HCT 29.6* 30.6* 31.3*  PLT 148* 180 195  HEPARINUNFRC 0.46 0.60 0.62  CREATININE  --  0.58* 0.64    Estimated Creatinine Clearance: 80.1 mL/min (by C-G formula based on SCr of 0.64 mg/dL).   Assessment: Patient is a 63 y.o. male with hx HTN, PAD, renal infarct, stage IV gastric cancer on chemo, and LE DVT in 2008 on Eliquis PTA who presented to the ED on 10/23/23 with c/o severe nausea and vomiting. Pt received last dose (cycle 5) of FOLFOX/nivolumab on 10/18/23. He reported nausea with recurrent bouts of vomiting since 10/20/23, which couldn't be improved by Protonix, Carafate, and [name] at home. Anticoagulation changed to heparin due to poor oral intake secondary to nausea and vomiting and in case invasive intervention is needed. - Last dose Eliquis was taken on 3/17 at 10am.  - Heparin drip held for EGD on 3/25 at 8:30am and restarted at 3pm  Today, 11/01/2023: - Heparin level remains therapeutic at 0.62 on 1150 units/hr - Hgb stable at 10.1, plts 195 - No bleeding reported by RN  Goal of Therapy:  Heparin level 0.3-0.7 units/ml Monitor platelets by anticoagulation protocol: Yes   Plan:  - Continue heparin drip on 1150 units/hr - Daily CBC & heparin level - Monitor for s/sx bleeding   Thank you for allowing pharmacy to be a part of this patient's care.  Tory Emerald, PharmD Candidate 11/01/2023  8:44 AM

## 2023-11-01 NOTE — Progress Notes (Addendum)
 [name]   DOB:[birth year]   ZO#:[id number]      ASSESSMENT & PLAN:  Gastric cancer, stage IV Peritoneal carcinomatosis  - Initially diagnosed December 2024 - Pathology shows metastatic poorly differentiated carcinoma consistent with primary gastric carcinoma, with signet cell features - Patient was started on FOLFOX/nivolumab cycle 1 on 08/16/2023.  Plan for every 2 weeks.  Completed 3 cycles and was admitted in February 2025 with intractable nausea/vomiting and dehydration.   - Treated with FOLFOX/nivolumab on 10/18/2023, G-CSF 10/20/2023 - CT abd/pelvis done 3/19 suspicious for peritoneal carcinomatosis -Medical oncology/Dr. Truett Perna has been following.   Ascites, malignant Possible Ileus Abdominal pain Nausea/vomiting -Secondary to malignancy - Vomiting ongoing.  Reports 1 episode overnight. - U/S abdomen done 10/24/2023 showed moderate diffuse ascites. - Has had previous paracentesis prior to this hospitalization.  Status post paracentesis 10/20/2023 with 4.6 L removed.  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 Also moderate to large ascites with peritoneal enhancement and omental caking, suspicious for peritoneal carcinomatosis.  - vomiting may be related to gastric outlet obstruction or carcinomatosis. - Palliative paracentesis as needed  - Seen by GI.  Status post upper endoscopy done 10/31/2023.  No role for stent placement at this time.   - Continue pain management and antiemetics as ordered   Poor appetite Weight loss - On soft diet, reports that he continues to vomit.  Encourage patient to continue eating because he will absorb a portion of everything he eats. - Encourage more out of bed to chair   History of DVT - Continues on IV heparin gtt. at this time  - on Eliquis at home, resume if there is no invasive procedure planned.   - Monitor closely for bleeding   Leukopenia/leukocytosis -Improved WBC  elevated, likely due to G-CSF.  Previously decreased due to recent chemotherapy.  - monitor closely   Anemia - Improving, hemoglobin stable 10.1 today   - Likely multifactorial due to recent therapy, nutritional deficiencies and malignancy - Continue to monitor CBC with differential   Thrombocytopenia - Resolved, platelets 195K today    - Continue to monitor CBC with differential   Fever - Resolved, afebrile today - Status post antibiotics - Monitor fever curve      Code Status Full  Subjective:  Patient seen awake and alert laying in bed.  Reports abdominal pain that he rates as 4 out of 10.  Patient reports disappointment with he cannot have a stent or catheter and that he he was told to just go home and try to increase his nutrition.  No other complaints offered.  Objective:  Vitals:   10/31/23 2150 11/01/23 0512  BP: 102/81 98/82  Pulse: (!) 103 96  Resp:  16  Temp:  98.4 F (36.9 C)  SpO2: 100% 99%     Intake/Output Summary (Last 24 hours) at 11/01/2023 1025 Last data filed at 11/01/2023 9811 Gross per 24 hour  Intake 324.87 ml  Output 505 ml  Net -180.13 ml     REVIEW OF SYSTEMS:   Constitutional: Denies fevers, chills or abnormal night sweats Eyes: Denies blurriness of vision, double vision or watery eyes Ears, nose, mouth, throat, and face: Denies mucositis or sore throat Respiratory: Denies cough, dyspnea or wheezes Cardiovascular: Denies palpitation, chest discomfort or lower extremity swelling Gastrointestinal: + nausea, + abdominal pain Skin: Denies abnormal skin rashes Lymphatics: Denies new lymphadenopathy or easy bruising Neurological: Denies numbness, tingling or new weaknesses Behavioral/Psych: Mood  is stable, no new changes  All other systems were reviewed with the patient and are negative.  PHYSICAL EXAMINATION: ECOG PERFORMANCE STATUS: 2 - Symptomatic, <50% confined to bed  Vitals:   10/31/23 2150 11/01/23 0512  BP: 102/81 98/82   Pulse: (!) 103 96  Resp:  16  Temp:  98.4 F (36.9 C)  SpO2: 100% 99%   Filed Weights   10/23/23 1939 10/25/23 1800  Weight: 127 lb (57.6 kg) 127 lb 1.6 oz (57.7 kg)    GENERAL: alert, no distress and comfortable SKIN: skin color, texture, turgor are normal, no rashes or significant lesions EYES: normal, conjunctiva are pink and non-injected, sclera clear OROPHARYNX: no exudate, no erythema and lips, buccal mucosa, and tongue normal  NECK: supple, thyroid normal size, non-tender, without nodularity LYMPH: no palpable lymphadenopathy in the cervical, axillary or inguinal LUNGS: clear to auscultation and percussion with normal breathing effort HEART: regular rate & rhythm and no murmurs and no lower extremity edema ABDOMEN: abdomen soft, non-tender and normal bowel sounds MUSCULOSKELETAL: no cyanosis of digits and no clubbing  PSYCH: alert & oriented x 3 with fluent speech NEURO: no focal motor/sensory deficits   All questions were answered. The patient knows to call the clinic with any problems, questions or concerns.   The total time spent in the appointment was 40 minutes encounter with patient including review of chart and various tests results, discussions about plan of care and coordination of care plan  [name], NP 11/01/2023 10:25 AM    Labs Reviewed:  Lab Results  Component Value Date   WBC 26.3 (H) 11/01/2023   HGB 10.1 (L) 11/01/2023   HCT 31.3 (L) 11/01/2023   MCV 87.9 11/01/2023   PLT 195 11/01/2023   Recent Labs    10/23/23 1603 10/24/23 0537 10/25/23 0400 10/29/23 0403 10/31/23 0238 11/01/23 0439  NA 133* 130*   < > 130* 130* 129*  K 3.0* 3.3*   < > 3.7 3.5 3.5  CL 92* 95*   < > 101 104 102  CO2 29 25   < > 20* 18* 20*  GLUCOSE 118* 80   < > 78 77 83  BUN 14 11   < > 8 9 10   CREATININE 0.77 0.65   < > 0.71 0.58* 0.64  CALCIUM 8.9 8.4*   < > 8.3* 8.2* 8.1*  GFRNONAA >60 >60   < > >60 >60 >60  PROT 5.8* 5.0*  --  4.9*  --   --   ALBUMIN  2.6* 2.3*  --  2.0*  --   --   AST 24 21  --  11*  --   --   ALT 10 8  --  9  --   --   ALKPHOS 169* 117  --  60  --   --   BILITOT 0.8 0.8  --  0.9  --   --   BILIDIR  --  <0.1  --   --   --   --   IBILI  --  NOT CALCULATED  --   --   --   --    < > = values in this interval not displayed.    Studies Reviewed:  [facility] Chest Port 1V same Day Result Date: 10/27/2023 CLINICAL DATA:  Fevers EXAM: PORTABLE CHEST 1 VIEW COMPARISON:  01/08/2020 FINDINGS: Cardiac shadow is within normal limits. Lungs are well aerated bilaterally. No focal infiltrate or effusion is seen. Right chest wall  port is noted in satisfactory position. No bony abnormality is noted. IMPRESSION: No active disease. Electronically Signed   By: Alcide Clever M.D.   On: 10/27/2023 21:29   US Paracentesis Result Date: 10/26/2023 INDICATION: Patient with history of gastric cancer and recurrent ascites. Request for diagnostic and therapeutic paracentesis. EXAM: ULTRASOUND GUIDED RIGHT LOWER QUADRANT PARACENTESIS MEDICATIONS: 1% plain lidocaine, 5 mL COMPLICATIONS: None immediate. PROCEDURE: Informed written consent was obtained from the patient after a discussion of the risks, benefits and alternatives to treatment. A timeout was performed prior to the initiation of the procedure. Initial ultrasound scanning demonstrates a large amount of ascites within the right lower abdominal quadrant. The right lower abdomen was prepped and draped in the usual sterile fashion. 1% lidocaine was used for local anesthesia. Following this, a 19 gauge, 7-cm, Yueh catheter was introduced. An ultrasound image was saved for documentation purposes. The paracentesis was performed. The catheter was removed and a dressing was applied. The patient tolerated the procedure well without immediate post procedural complication. FINDINGS: A total of approximately 1.2 L of hazy yellow fluid was removed. Samples were sent to the laboratory as requested by the clinical team.  IMPRESSION: Successful ultrasound-guided paracentesis yielding 1.2 liters of peritoneal fluid. Procedure performed by Brayton El PA-C and supervised by Dr. Ruel Favors Electronically Signed   By: Judie Petit.  Shick M.D.   On: 10/26/2023 15:19   CT ABDOMEN PELVIS W CONTRAST Result Date: 10/25/2023 CLINICAL DATA:  Epigastric pain. History of gastric cancer. Assess treatment response. Concern for possible carcinomatosis. EXAM: CT ABDOMEN AND PELVIS WITH CONTRAST TECHNIQUE: Multidetector CT imaging of the abdomen and pelvis was performed using the standard protocol following bolus administration of intravenous contrast. RADIATION DOSE REDUCTION: This exam was performed according to the departmental dose-optimization program which includes automated exposure control, adjustment of the mA and/or kV according to patient size and/or use of iterative reconstruction technique. CONTRAST:  OMNIPAQUE IOHEXOL 300 MG/ML  SOLN COMPARISON:  09/28/2023, 03/28/2023. FINDINGS: Lower chest: The heart is normal in size and there is a small pericardial effusion. Emphysematous changes are present in the lungs. Mild atelectasis or scarring is noted at the lung bases. Hepatobiliary: Scattered subcentimeter hypodensities are present in the liver common decreased in size from 03/28/2023 and not well seen on 09/28/2023. The gallbladder is surgically absent. No biliary ductal dilatation is seen. Pancreas: Unremarkable. No pancreatic ductal dilatation or surrounding inflammatory changes. Spleen: Normal in size without focal abnormality. Adrenals/Urinary Tract: The adrenal glands are within normal limits. The kidneys enhance symmetrically. Subcentimeter hypodensities are present in the kidneys bilaterally which are too small to further characterize. Renal cortical scarring is present on the right. No renal calculus or hydronephrosis bilaterally. No obvious bladder abnormality is seen, however examination is limited due to hardware artifact.  Stomach/Bowel: The stomach is distended with fluid. Gastric wall thickening is noted about the antrum and proximal duodenum. A focally dilated loop of jejunum is noted in the mid left abdomen measuring up to 3.6 cm, with no obvious obstruction. Contrast is identified in the colon. There is thickening of the walls of the mid transverse colon. The appendix is not seen. Vascular/Lymphatic: Aortic atherosclerosis. No enlarged abdominal or pelvic lymph nodes. Reproductive: Prostate is unremarkable. Other: There is moderate-to-large ascites with peritoneal enhancement and omental caking in the upper abdomen, concerning for peritoneal carcinomatosis. Musculoskeletal: Total hip arthroplasty changes are present on the left. Degenerative changes are present in the thoracolumbar spine. A pars defect is noted at L5 on the  left. No acute osseous abnormality is seen. IMPRESSION: 1. Focally dilated loop of jejunum in the left upper quadrant without obvious evidence of obstruction. Findings may represent possible ileus. 2. A focal thickening of the walls of the mid transverse colon, which may be reactive versus other infectious or inflammatory process. 3. Bowel wall thickening involving the gastric antrum and pylorus, similar in appearance to the prior exam. 4. Moderate-to-large ascites with peritoneal enhancement and omental caking, suspicious for peritoneal carcinomatosis. 5. Aortic atherosclerosis. 6. Remaining incidental findings as described above. Electronically Signed   By: Thornell Sartorius M.D.   On: 10/25/2023 20:53   US Abdomen Limited Result Date: 10/24/2023 CLINICAL DATA:  Ascites EXAM: LIMITED ABDOMEN ULTRASOUND FOR ASCITES TECHNIQUE: Limited ultrasound survey for ascites was performed in all four abdominal quadrants. COMPARISON:  09/28/2023 FINDINGS: Moderate ascites identified particularly right hemiabdomen and left lower quadrant. IMPRESSION: Moderate diffuse ascites. Electronically Signed   By: Karen Kays M.D.    On: 10/24/2023 13:24   DG Abd 1 View Result Date: 10/23/2023 CLINICAL DATA:  Nausea, vomiting EXAM: ABDOMEN - 1 VIEW COMPARISON:  None Available. FINDINGS: The bowel gas pattern is normal. No radio-opaque calculi or other significant radiographic abnormality are seen. Moderate stool burden throughout the colon. IMPRESSION: Moderate stool burden.  No acute findings. Electronically Signed   By: Charlett Nose M.D.   On: 10/23/2023 22:38   IR Paracentesis Result Date: 10/20/2023 INDICATION: Patient with history of gastric cancer, recurrent ascites. Request for diagnostic and therapeutic paracentesis. EXAM: ULTRASOUND GUIDED DIAGNOSTIC AND THERAPEUTIC PARACENTESIS MEDICATIONS: 7 mL 1% lidocaine. COMPLICATIONS: None immediate. PROCEDURE: Informed written consent was obtained from the patient after a discussion of the risks, benefits and alternatives to treatment. A timeout was performed prior to the initiation of the procedure. Initial ultrasound scanning demonstrates a large amount of ascites within the right lower abdominal quadrant. The right lower abdomen was prepped and draped in the usual sterile fashion. 1% lidocaine was used for local anesthesia. Following this, a 19 gauge, 7-cm, Yueh catheter was introduced. An ultrasound image was saved for documentation purposes. The paracentesis was performed. The catheter was removed and a dressing was applied. The patient tolerated the procedure well without immediate post procedural complication. FINDINGS: A total of approximately 4.6 L of clear yellow fluid was removed. Samples were sent to the laboratory as requested by the clinical team. IMPRESSION: Successful ultrasound-guided paracentesis yielding 4.6 liters of peritoneal fluid. Performed by Lynnette Caffey, PA-C Electronically Signed   By: Irish Lack M.D.   On: 10/20/2023 13:20  Mr Garey Ham was interviewed and examined.  He underwent an EGD yesterday.  There was no evidence of gastric outlet obstruction.   I discussed the EGD findings with Dr. [name].  I discussed the endoscopy findings and management options with Mr. Garey Ham and his wife (present by telephone).  He will continue a full liquid diet and advance to a mechanical soft diet as tolerated.  He understands he may have recurrent emesis with solids.  It is unclear whether the gastric cancer has responded to chemotherapy.  He will be scheduled for an office visit and FOLFOX/nivolumab on 11/08/2023.  We are checking the gastric tumor for CLDN18.2 expression to see whether he may be a candidate for zolbetuximab.  Recommendations: Discontinue heparin and resume apixaban when okay with gastroenterology Liquid/mechanical soft diet as tolerated Outpatient follow-up with Cancer center 11/08/2023

## 2023-11-01 NOTE — Discharge Summary (Signed)
 Physician Discharge Summary   [name] WGN:[id number] DOB: February 06, 1961 DOA: 10/23/2023  PCP: Wanda Plump, MD  Admit date: 10/23/2023 Discharge date: 11/01/2023   Admitted From: Home  Disposition:  Home  Discharging physician: Lewie Chamber, MD Barriers to discharge: none  Recommendations at discharge: Continue care with oncology    Discharge Condition: stable CODE STATUS: Full Diet recommendation:  Diet Orders (From admission, onward)     Start     Ordered   11/01/23 0754  DIET SOFT Room service appropriate? Yes; Fluid consistency: Thin  Diet effective now       Question Answer Comment  Room service appropriate? Yes   Fluid consistency: Thin      11/01/23 0755   11/01/23 0000  Diet general        11/01/23 1150            Hospital Course: 63 year old male with gastric cancer who is undergoing chemotherapy. He has chronic issues with nausea vomiting and abdominal pain however, he presented due to feeling worse over the past few days. He states that he was having episodes of intractable nausea and vomiting and had been unable to tolerate liquids and solids.  Workup on admission was significant for neutropenia.  CT scan revealed dilated loops of jejunum suggesting ileitis.  Some focal thickening of the transverse colon was also reported.  Moderate to large ascites were noted secondary to peritoneal carcinomatosis.  Status post paracentesis.   Assessment and Plan: Abdominal pain - multifactorial from gastric cancer, colonic wall thickening, ascites with peritoneal enhancement and omental caking (suspicious for peritoneal carcinomatosis) - focus should be pain control  -EGD performed on 10/31/2023 with no acute findings; LA grade D esophagitis with no bleeding noted; diffuse prominent friable erythematous gastric folds; no evidence of gastric outlet obstruction nor any role for stenting -Patient has been counseled to slowly advance diet; he is also continued on scheduled  Reglan and also has other antiemetics at home along with [name] which was recently started by oncology  Ascites  - with findings of peritoneal enhancement, consistent with peritoneal carcinomatosis -Underwent paracentesis on 10/26/2023 removing 1.2 L fluid -Anticipate will need serial paracenteses in the future   Severe neutropenia with fever - Most likely chemotherapy-induced.  Seen and evaluated by oncology.   -Treated with empiric antibiotics and ANC has improved back to normal - Okay to discontinue antibiotics  History DVT: Resume Eliquis at discharge   Cachexia secondary to advanced stage IV gastric cancer disease -Underwent EGD on 10/31/2023; no evidence of gastric outlet obstruction noted -Resume diet after EGD -Not a candidate for PEG placement due to peritoneal carcinomatosis -Continued on [name] - Followed closely by oncology; may need to consider ultimate transition to hospice if he continues to have progressive decline   Thrombocytopenia: Most likely chemotherapy induced.   Protein energy malnutrition: BMI index of 16.7.  Patient has poor oral intake due to gastric mass.  Limitations with gastrostomy tube placement due to peritoneal carcinomatosis. Patient understands to advance diet as able    The patient's acute and chronic medical conditions were treated accordingly. On day of discharge, patient was felt deemed stable for discharge. Patient/family member advised to call PCP or come back to ER if needed.   Principal Diagnosis: Intractable nausea and vomiting  Discharge Diagnoses: Active Hospital Problems   Diagnosis Date Noted   Intractable nausea and vomiting 09/27/2023    Priority: 2.   Gastric cancer (HCC) 08/03/2023    Priority: 1.  Abdominal pain 10/31/2023    Priority: 2.   Metastatic cancer (HCC) 10/25/2023   Protein-calorie malnutrition, severe 10/25/2023   Adenocarcinoma carcinomatosis (HCC) 10/25/2023   Hypokalemia 09/27/2023   Malignant ascites  07/19/2023   HTN (hypertension) 09/16/2014   DVT, HX OF 12/25/2006    Resolved Hospital Problems  No resolved problems to display.     Discharge Instructions     Diet general   Complete by: As directed    Increase activity slowly   Complete by: As directed       Allergies as of 11/01/2023       Reactions   Pravastatin Itching   Simvastatin Itching        Medication List     TAKE these medications    aluminum-magnesium hydroxide-simethicone 200-200-20 MG/5ML Susp Commonly known as: MAALOX Take 15 mLs by mouth as needed (As needed).   atorvastatin 40 MG tablet Commonly known as: LIPITOR Take 40 mg by mouth daily.   dronabinol 2.5 MG capsule Commonly known as: [name] Take 1 capsule (2.5 mg total) by mouth 2 (two) times daily before lunch and supper.   Eliquis 5 MG Tabs tablet Generic drug: apixaban Take 1 tablet by mouth twice daily What changed:  how much to take when to take this additional instructions   EPINEPHrine 0.3 mg/0.3 mL Soaj injection Commonly known as: EpiPen 2-Pak Inject 0.3 mg into the muscle as needed for anaphylaxis.   HYDROmorphone 4 MG tablet Commonly known as: Dilaudid Take 1-2 tablets (4-8 mg total) by mouth every 4 (four) hours as needed for severe pain (pain score 7-10).   lidocaine-prilocaine cream Commonly known as: EMLA Apply 1 Application topically as needed (Apply to Port 1-2 hours prior to its use).   metoCLOPramide 10 MG tablet Commonly known as: REGLAN Take 1 tablet (10 mg total) by mouth 4 (four) times daily -  before meals and at bedtime.   NICODERM CQ TD Place 1 patch onto the skin daily as needed (for smoking cessation).   ondansetron 8 MG tablet Commonly known as: ZOFRAN TAKE 1 TABLET BY MOUTH EVERY 8 HOURS AS NEEDED (START  3  DAYS  AFTER  CHEMO  AS  NEEDED  FOR  NAUSEA/VOMITING). What changed: See the new instructions.   pantoprazole 40 MG tablet Commonly known as: PROTONIX Take 1 tablet (40 mg total) by  mouth 2 (two) times daily.   polyethylene glycol 17 g packet Commonly known as: MIRALAX / GLYCOLAX Take 17 g by mouth daily as needed for mild constipation.   potassium chloride 10 MEQ tablet Commonly known as: KLOR-CON Take 10 mEq by mouth 2 (two) times daily.   prochlorperazine 10 MG tablet Commonly known as: COMPAZINE TAKE 1 TABLET BY MOUTH EVERY 6 HOURS AS NEEDED FOR NAUSEA FOR VOMITING What changed: See the new instructions.   sennosides-docusate sodium 8.6-50 MG tablet Commonly known as: SENOKOT-S Take 2 tablets by mouth 2 (two) times daily as needed for constipation (hold for diarrhea).   sorbitol 70 % Soln Take 15 ml by mouth every 6 hours until BM occurs as directed by physician.  Then take 15 ml daily as needed for constipation.   sucralfate 1 g tablet Commonly known as: Carafate Take 1 tablet (1 g total) by mouth 4 (four) times daily -  with meals and at bedtime. Dissolve tablet in water to make liquid        Allergies  Allergen Reactions   Pravastatin Itching   Simvastatin Itching  Consultations: GI Oncology  Procedures: 10/31/2023: EGD  Discharge Exam: BP 98/82 (BP Location: Left Arm)   Pulse 96   Temp 98.4 F (36.9 C) (Oral)   Resp 16   Ht 6\' 1"  (1.854 m)   Wt 57.7 kg   SpO2 99%   BMI 16.77 kg/m  Physical Exam Constitutional:      General: He is not in acute distress.    Appearance: Normal appearance.  HENT:     Head: Normocephalic and atraumatic.     Mouth/Throat:     Mouth: Mucous membranes are moist.  Eyes:     Extraocular Movements: Extraocular movements intact.  Cardiovascular:     Rate and Rhythm: Normal rate and regular rhythm.  Pulmonary:     Effort: Pulmonary effort is normal. No respiratory distress.     Breath sounds: Normal breath sounds. No wheezing.  Abdominal:     General: Bowel sounds are normal. There is no distension.     Palpations: Abdomen is soft.     Tenderness: There is no abdominal tenderness.   Musculoskeletal:        General: Normal range of motion.     Cervical back: Normal range of motion and neck supple.  Skin:    General: Skin is warm and dry.  Neurological:     General: No focal deficit present.     Mental Status: He is alert.  Psychiatric:        Mood and Affect: Mood normal.        Behavior: Behavior normal.      The results of significant diagnostics from this hospitalization (including imaging, microbiology, ancillary and laboratory) are listed below for reference.   Microbiology: Recent Results (from the past 240 hours)  Resp panel by RT-PCR (RSV, Flu A&B, Covid) Anterior Nasal Swab     Status: None   Collection Time: 10/23/23  3:50 PM   Specimen: Anterior Nasal Swab  Result Value Ref Range Status   SARS Coronavirus 2 by RT PCR NEGATIVE NEGATIVE Final    Comment: (NOTE) SARS-CoV-2 target nucleic acids are NOT DETECTED.  The SARS-CoV-2 RNA is generally detectable in upper respiratory specimens during the acute phase of infection. The lowest concentration of SARS-CoV-2 viral copies this assay can detect is 138 copies/mL. A negative result does not preclude SARS-Cov-2 infection and should not be used as the sole basis for treatment or other patient management decisions. A negative result may occur with  improper specimen collection/handling, submission of specimen other than nasopharyngeal swab, presence of viral mutation(s) within the areas targeted by this assay, and inadequate number of viral copies(<138 copies/mL). A negative result must be combined with clinical observations, patient history, and epidemiological information. The expected result is Negative.  Fact Sheet for Patients:  BloggerCourse.com  Fact Sheet for Healthcare Providers:  SeriousBroker.it  This test is no t yet approved or cleared by the [place] FDA and  has been authorized for detection and/or diagnosis of SARS-CoV-2 by FDA  under an Emergency Use Authorization (EUA). This EUA will remain  in effect (meaning this test can be used) for the duration of the COVID-19 declaration under Section 564(b)(1) of the Act, 21 U.S.C.section 360bbb-3(b)(1), unless the authorization is terminated  or revoked sooner.       Influenza A by PCR NEGATIVE NEGATIVE Final   Influenza B by PCR NEGATIVE NEGATIVE Final    Comment: (NOTE) The Xpert Xpress SARS-CoV-2/FLU/RSV plus assay is intended as an aid in the diagnosis of influenza  from Nasopharyngeal swab specimens and should not be used as a sole basis for treatment. Nasal washings and aspirates are unacceptable for Xpert Xpress SARS-CoV-2/FLU/RSV testing.  Fact Sheet for Patients: BloggerCourse.com  Fact Sheet for Healthcare Providers: SeriousBroker.it  This test is not yet approved or cleared by the [place] FDA and has been authorized for detection and/or diagnosis of SARS-CoV-2 by FDA under an Emergency Use Authorization (EUA). This EUA will remain in effect (meaning this test can be used) for the duration of the COVID-19 declaration under Section 564(b)(1) of the Act, 21 U.S.C. section 360bbb-3(b)(1), unless the authorization is terminated or revoked.     Resp Syncytial Virus by PCR NEGATIVE NEGATIVE Final    Comment: (NOTE) Fact Sheet for Patients: BloggerCourse.com  Fact Sheet for Healthcare Providers: SeriousBroker.it  This test is not yet approved or cleared by the [place] FDA and has been authorized for detection and/or diagnosis of SARS-CoV-2 by FDA under an Emergency Use Authorization (EUA). This EUA will remain in effect (meaning this test can be used) for the duration of the COVID-19 declaration under Section 564(b)(1) of the Act, 21 U.S.C. section 360bbb-3(b)(1), unless the authorization is terminated or revoked.  Performed at [facility], [address] [place], [place] [address]   Culture, blood (Routine X 2) w Reflex to ID Panel     Status: None (Preliminary result)   Collection Time: 10/27/23  7:39 PM   Specimen: BLOOD LEFT HAND  Result Value Ref Range Status   Specimen Description   Final    BLOOD LEFT HAND Performed at [facility], [address] [place], [place] [address]    Special Requests   Final    BOTTLES DRAWN AEROBIC ONLY Blood Culture results may not be optimal due to an inadequate volume of blood received in culture bottles Performed at [facility], [address], [place], [place] [address]    Culture   Final    NO GROWTH 4 DAYS Performed at [facility], [address] [place], [place] [address]    Report Status PENDING  Incomplete  Culture, blood (Routine X 2) w Reflex to ID Panel     Status: None (Preliminary result)   Collection Time: 10/27/23  7:39 PM   Specimen: BLOOD LEFT HAND  Result Value Ref Range Status   Specimen Description   Final    BLOOD LEFT HAND Performed at [facility], [address], [place], [place] [address]    Special Requests   Final    BOTTLES DRAWN AEROBIC ONLY Blood Culture results may not be optimal due to an inadequate volume of blood received in culture bottles Performed at [facility], [address] [place], [place] [address]    Culture   Final    NO GROWTH 4 DAYS Performed at [facility], [address], [place], [place] [address]    Report Status PENDING  Incomplete     Labs: BNP (last 3 results) No results for input(s): "BNP" in the last 8760 hours. Basic Metabolic Panel: Recent Labs  Lab 10/26/23 0202 10/27/23 0353 10/29/23 0403 10/31/23 0238 11/01/23 0439  NA 131* 131* 130* 130* 129*  K 4.0 3.8 3.7 3.5 3.5  CL 100 100 101 104 102  CO2 22 21* 20* 18* 20*  GLUCOSE 72 75 78 77 83  BUN 8 8 8 9 10   CREATININE 0.55* 0.65 0.71 0.58* 0.64  CALCIUM 8.0*  8.3*  8.3* 8.2* 8.1*   Liver Function Tests: Recent Labs  Lab 10/29/23 0403  AST 11*  ALT 9  ALKPHOS 60  BILITOT 0.9  PROT 4.9*  ALBUMIN 2.0*   Recent Labs  Lab 10/26/23 0202  LIPASE 33   No results for input(s): "AMMONIA" in the last 168 hours. CBC: Recent Labs  Lab 10/28/23 0235 10/29/23 0403 10/30/23 0258 10/31/23 0238 11/01/23 0439  WBC 1.1* 1.4* 3.6* 12.3* 26.3*  NEUTROABS 0.1* 0.1* 1.2* 9.3* 16.1*  HGB 9.9* 9.8* 9.7* 10.1* 10.1*  HCT 30.6* 30.7* 29.6* 30.6* 31.3*  MCV 89.5 88.0 88.4 87.4 87.9  PLT 112* 130* 148* 180 195   Cardiac Enzymes: No results for input(s): "CKTOTAL", "CKMB", "CKMBINDEX", "TROPONINI" in the last 168 hours. BNP: Invalid input(s): "POCBNP" CBG: No results for input(s): "GLUCAP" in the last 168 hours. D-Dimer No results for input(s): "DDIMER" in the last 72 hours. Hgb A1c No results for input(s): "HGBA1C" in the last 72 hours. Lipid Profile No results for input(s): "CHOL", "HDL", "LDLCALC", "TRIG", "CHOLHDL", "LDLDIRECT" in the last 72 hours. Thyroid function studies No results for input(s): "TSH", "T4TOTAL", "T3FREE", "THYROIDAB" in the last 72 hours.  Invalid input(s): "FREET3" Anemia work up No results for input(s): "VITAMINB12", "FOLATE", "FERRITIN", "TIBC", "IRON", "RETICCTPCT" in the last 72 hours. Urinalysis    Component Value Date/Time   COLORURINE YELLOW 10/28/2023 0234   APPEARANCEUR CLEAR 10/28/2023 0234   LABSPEC 1.027 10/28/2023 0234   PHURINE 6.0 10/28/2023 0234   GLUCOSEU NEGATIVE 10/28/2023 0234   GLUCOSEU NEGATIVE 07/11/2023 1213   HGBUR NEGATIVE 10/28/2023 0234   BILIRUBINUR NEGATIVE 10/28/2023 0234   BILIRUBINUR Negative 02/05/2021 1125   KETONESUR 20 (A) 10/28/2023 0234   PROTEINUR 30 (A) 10/28/2023 0234   UROBILINOGEN 0.2 07/11/2023 1213   NITRITE NEGATIVE 10/28/2023 0234   LEUKOCYTESUR NEGATIVE 10/28/2023 0234   Sepsis Labs Recent Labs  Lab 10/29/23 0403 10/30/23 0258 10/31/23 0238 11/01/23 0439   WBC 1.4* 3.6* 12.3* 26.3*   Microbiology Recent Results (from the past 240 hours)  Resp panel by RT-PCR (RSV, Flu A&B, Covid) Anterior Nasal Swab     Status: None   Collection Time: 10/23/23  3:50 PM   Specimen: Anterior Nasal Swab  Result Value Ref Range Status   SARS Coronavirus 2 by RT PCR NEGATIVE NEGATIVE Final    Comment: (NOTE) SARS-CoV-2 target nucleic acids are NOT DETECTED.  The SARS-CoV-2 RNA is generally detectable in upper respiratory specimens during the acute phase of infection. The lowest concentration of SARS-CoV-2 viral copies this assay can detect is 138 copies/mL. A negative result does not preclude SARS-Cov-2 infection and should not be used as the sole basis for treatment or other patient management decisions. A negative result may occur with  improper specimen collection/handling, submission of specimen other than nasopharyngeal swab, presence of viral mutation(s) within the areas targeted by this assay, and inadequate number of viral copies(<138 copies/mL). A negative result must be combined with clinical observations, patient history, and epidemiological information. The expected result is Negative.  Fact Sheet for Patients:  BloggerCourse.com  Fact Sheet for Healthcare Providers:  SeriousBroker.it  This test is no t yet approved or cleared by the [place] FDA and  has been authorized for detection and/or diagnosis of SARS-CoV-2 by FDA under an Emergency Use Authorization (EUA). This EUA will remain  in effect (meaning this test can be used) for the duration of the COVID-19 declaration under Section 564(b)(1) of the Act, 21 U.S.C.section 360bbb-3(b)(1), unless the  authorization is terminated  or revoked sooner.       Influenza A by PCR NEGATIVE NEGATIVE Final   Influenza B by PCR NEGATIVE NEGATIVE Final    Comment: (NOTE) The Xpert Xpress SARS-CoV-2/FLU/RSV plus assay is intended as an aid in  the diagnosis of influenza from Nasopharyngeal swab specimens and should not be used as a sole basis for treatment. Nasal washings and aspirates are unacceptable for Xpert Xpress SARS-CoV-2/FLU/RSV testing.  Fact Sheet for Patients: BloggerCourse.com  Fact Sheet for Healthcare Providers: SeriousBroker.it  This test is not yet approved or cleared by the [place] FDA and has been authorized for detection and/or diagnosis of SARS-CoV-2 by FDA under an Emergency Use Authorization (EUA). This EUA will remain in effect (meaning this test can be used) for the duration of the COVID-19 declaration under Section 564(b)(1) of the Act, 21 U.S.C. section 360bbb-3(b)(1), unless the authorization is terminated or revoked.     Resp Syncytial Virus by PCR NEGATIVE NEGATIVE Final    Comment: (NOTE) Fact Sheet for Patients: BloggerCourse.com  Fact Sheet for Healthcare Providers: SeriousBroker.it  This test is not yet approved or cleared by the [place] FDA and has been authorized for detection and/or diagnosis of SARS-CoV-2 by FDA under an Emergency Use Authorization (EUA). This EUA will remain in effect (meaning this test can be used) for the duration of the COVID-19 declaration under Section 564(b)(1) of the Act, 21 U.S.C. section 360bbb-3(b)(1), unless the authorization is terminated or revoked.  Performed at [facility], [address], [place], [place] [address]   Culture, blood (Routine X 2) w Reflex to ID Panel     Status: None (Preliminary result)   Collection Time: 10/27/23  7:39 PM   Specimen: BLOOD LEFT HAND  Result Value Ref Range Status   Specimen Description   Final    BLOOD LEFT HAND Performed at [facility], [address], [place], [place] [address]    Special Requests   Final    BOTTLES DRAWN AEROBIC ONLY Blood Culture results may not be  optimal due to an inadequate volume of blood received in culture bottles Performed at [facility], [address] [place], [place] [address]    Culture   Final    NO GROWTH 4 DAYS Performed at [facility], [address], [place], [place] [address]    Report Status PENDING  Incomplete  Culture, blood (Routine X 2) w Reflex to ID Panel     Status: None (Preliminary result)   Collection Time: 10/27/23  7:39 PM   Specimen: BLOOD LEFT HAND  Result Value Ref Range Status   Specimen Description   Final    BLOOD LEFT HAND Performed at [facility], [address], [place], [place] [address]    Special Requests   Final    BOTTLES DRAWN AEROBIC ONLY Blood Culture results may not be optimal due to an inadequate volume of blood received in culture bottles Performed at [facility], [address] [place], [place] [address]    Culture   Final    NO GROWTH 4 DAYS Performed at [facility], [address] [place], [place] [address]    Report Status PENDING  Incomplete    Procedures/Studies: DG Chest Port 1V same Day Result Date: 10/27/2023 CLINICAL DATA:  Fevers EXAM: PORTABLE CHEST 1 VIEW COMPARISON:  01/08/2020 FINDINGS: Cardiac shadow is within normal limits. Lungs are well aerated bilaterally.  No focal infiltrate or effusion is seen. Right chest wall port is noted in satisfactory position. No bony abnormality is noted. IMPRESSION: No active disease. Electronically Signed   By: Alcide Clever M.D.   On: 10/27/2023 21:29   US Paracentesis Result Date: 10/26/2023 INDICATION: Patient with history of gastric cancer and recurrent ascites. Request for diagnostic and therapeutic paracentesis. EXAM: ULTRASOUND GUIDED RIGHT LOWER QUADRANT PARACENTESIS MEDICATIONS: 1% plain lidocaine, 5 mL COMPLICATIONS: None immediate. PROCEDURE: Informed written consent was obtained from the patient after a discussion of the risks, benefits and alternatives to  treatment. A timeout was performed prior to the initiation of the procedure. Initial ultrasound scanning demonstrates a large amount of ascites within the right lower abdominal quadrant. The right lower abdomen was prepped and draped in the usual sterile fashion. 1% lidocaine was used for local anesthesia. Following this, a 19 gauge, 7-cm, Yueh catheter was introduced. An ultrasound image was saved for documentation purposes. The paracentesis was performed. The catheter was removed and a dressing was applied. The patient tolerated the procedure well without immediate post procedural complication. FINDINGS: A total of approximately 1.2 L of hazy yellow fluid was removed. Samples were sent to the laboratory as requested by the clinical team. IMPRESSION: Successful ultrasound-guided paracentesis yielding 1.2 liters of peritoneal fluid. Procedure performed by Brayton El PA-C and supervised by Dr. Ruel Favors Electronically Signed   By: Judie Petit.  Shick M.D.   On: 10/26/2023 15:19   CT ABDOMEN PELVIS W CONTRAST Result Date: 10/25/2023 CLINICAL DATA:  Epigastric pain. History of gastric cancer. Assess treatment response. Concern for possible carcinomatosis. EXAM: CT ABDOMEN AND PELVIS WITH CONTRAST TECHNIQUE: Multidetector CT imaging of the abdomen and pelvis was performed using the standard protocol following bolus administration of intravenous contrast. RADIATION DOSE REDUCTION: This exam was performed according to the departmental dose-optimization program which includes automated exposure control, adjustment of the mA and/or kV according to patient size and/or use of iterative reconstruction technique. CONTRAST:  OMNIPAQUE IOHEXOL 300 MG/ML  SOLN COMPARISON:  09/28/2023, 03/28/2023. FINDINGS: Lower chest: The heart is normal in size and there is a small pericardial effusion. Emphysematous changes are present in the lungs. Mild atelectasis or scarring is noted at the lung bases. Hepatobiliary: Scattered  subcentimeter hypodensities are present in the liver common decreased in size from 03/28/2023 and not well seen on 09/28/2023. The gallbladder is surgically absent. No biliary ductal dilatation is seen. Pancreas: Unremarkable. No pancreatic ductal dilatation or surrounding inflammatory changes. Spleen: Normal in size without focal abnormality. Adrenals/Urinary Tract: The adrenal glands are within normal limits. The kidneys enhance symmetrically. Subcentimeter hypodensities are present in the kidneys bilaterally which are too small to further characterize. Renal cortical scarring is present on the right. No renal calculus or hydronephrosis bilaterally. No obvious bladder abnormality is seen, however examination is limited due to hardware artifact. Stomach/Bowel: The stomach is distended with fluid. Gastric wall thickening is noted about the antrum and proximal duodenum. A focally dilated loop of jejunum is noted in the mid left abdomen measuring up to 3.6 cm, with no obvious obstruction. Contrast is identified in the colon. There is thickening of the walls of the mid transverse colon. The appendix is not seen. Vascular/Lymphatic: Aortic atherosclerosis. No enlarged abdominal or pelvic lymph nodes. Reproductive: Prostate is unremarkable. Other: There is moderate-to-large ascites with peritoneal enhancement and omental caking in the upper abdomen, concerning for peritoneal carcinomatosis. Musculoskeletal: Total hip arthroplasty changes are present on the left. Degenerative changes are present in the thoracolumbar  spine. A pars defect is noted at L5 on the left. No acute osseous abnormality is seen. IMPRESSION: 1. Focally dilated loop of jejunum in the left upper quadrant without obvious evidence of obstruction. Findings may represent possible ileus. 2. A focal thickening of the walls of the mid transverse colon, which may be reactive versus other infectious or inflammatory process. 3. Bowel wall thickening involving  the gastric antrum and pylorus, similar in appearance to the prior exam. 4. Moderate-to-large ascites with peritoneal enhancement and omental caking, suspicious for peritoneal carcinomatosis. 5. Aortic atherosclerosis. 6. Remaining incidental findings as described above. Electronically Signed   By: Thornell Sartorius M.D.   On: 10/25/2023 20:53   US Abdomen Limited Result Date: 10/24/2023 CLINICAL DATA:  Ascites EXAM: LIMITED ABDOMEN ULTRASOUND FOR ASCITES TECHNIQUE: Limited ultrasound survey for ascites was performed in all four abdominal quadrants. COMPARISON:  09/28/2023 FINDINGS: Moderate ascites identified particularly right hemiabdomen and left lower quadrant. IMPRESSION: Moderate diffuse ascites. Electronically Signed   By: Karen Kays M.D.   On: 10/24/2023 13:24   DG Abd 1 View Result Date: 10/23/2023 CLINICAL DATA:  Nausea, vomiting EXAM: ABDOMEN - 1 VIEW COMPARISON:  None Available. FINDINGS: The bowel gas pattern is normal. No radio-opaque calculi or other significant radiographic abnormality are seen. Moderate stool burden throughout the colon. IMPRESSION: Moderate stool burden.  No acute findings. Electronically Signed   By: Charlett Nose M.D.   On: 10/23/2023 22:38   IR Paracentesis Result Date: 10/20/2023 INDICATION: Patient with history of gastric cancer, recurrent ascites. Request for diagnostic and therapeutic paracentesis. EXAM: ULTRASOUND GUIDED DIAGNOSTIC AND THERAPEUTIC PARACENTESIS MEDICATIONS: 7 mL 1% lidocaine. COMPLICATIONS: None immediate. PROCEDURE: Informed written consent was obtained from the patient after a discussion of the risks, benefits and alternatives to treatment. A timeout was performed prior to the initiation of the procedure. Initial ultrasound scanning demonstrates a large amount of ascites within the right lower abdominal quadrant. The right lower abdomen was prepped and draped in the usual sterile fashion. 1% lidocaine was used for local anesthesia. Following this, a  19 gauge, 7-cm, Yueh catheter was introduced. An ultrasound image was saved for documentation purposes. The paracentesis was performed. The catheter was removed and a dressing was applied. The patient tolerated the procedure well without immediate post procedural complication. FINDINGS: A total of approximately 4.6 L of clear yellow fluid was removed. Samples were sent to the laboratory as requested by the clinical team. IMPRESSION: Successful ultrasound-guided paracentesis yielding 4.6 liters of peritoneal fluid. Performed by Lynnette Caffey, PA-C Electronically Signed   By: Irish Lack M.D.   On: 10/20/2023 13:20     Time coordinating discharge: Over 30 minutes    Lewie Chamber, MD  [facility] 11/01/2023, 1:58 PM

## 2023-11-02 ENCOUNTER — Other Ambulatory Visit (HOSPITAL_BASED_OUTPATIENT_CLINIC_OR_DEPARTMENT_OTHER): Payer: Self-pay

## 2023-11-02 ENCOUNTER — Other Ambulatory Visit: Payer: Self-pay | Admitting: Nurse Practitioner

## 2023-11-02 ENCOUNTER — Telehealth: Payer: Self-pay | Admitting: *Deleted

## 2023-11-02 ENCOUNTER — Encounter (HOSPITAL_COMMUNITY): Payer: Self-pay | Admitting: Gastroenterology

## 2023-11-02 DIAGNOSIS — C163 Malignant neoplasm of pyloric antrum: Secondary | ICD-10-CM

## 2023-11-02 LAB — SURGICAL PATHOLOGY

## 2023-11-02 LAB — CULTURE, BLOOD (ROUTINE X 2)
Culture: NO GROWTH
Culture: NO GROWTH

## 2023-11-02 MED ORDER — HYDROMORPHONE HCL 4 MG PO TABS: 4.0000 mg | ORAL_TABLET | ORAL | 0 refills | Status: DC | PRN

## 2023-11-02 NOTE — Transitions of Care (Post Inpatient/ED Visit) (Signed)
 11/02/2023  Name: [name] MRN: [id number] DOB: 14-Aug-1960  Today's TOC FU Call Status: Today's TOC FU Call Status:: Successful TOC FU Call Completed TOC FU Call Complete Date: 11/02/23 Patient's Name and Date of Birth confirmed.  Transition Care Management Follow-up Telephone Call Date of Discharge: 11/01/23 Discharge Facility: [facility]) Type of Discharge: Inpatient Admission Primary Inpatient Discharge Diagnosis:: intractable N/V; paracentesis- in setting of metastatic stage IV gastric cancer How have you been since you were released from the hospital?: Better ("I am a little better I guess; still nauseous, but I am always that way.  Can't seem to hold anything down.  I have called the cancer center already,I need a refill on the pain medication they give me.  I am well supported by the cancer team") Any questions or concerns?: Yes Patient Questions/Concerns:: "I need a refill on the pain medicine the cancer doctor prescribes" Patient Questions/Concerns Addressed: Other: (Patient confirms he has contacted the cancer center and requested refill; he and his spouse reports they are well- supported by oncology team/ nurse navigator, and deny need for additional support/ intervention around this request)  Confirmed by signing of chart that patient's oncology provider has sent in refill for pain medication as requested by patient earlier today  Items Reviewed: Did you receive and understand the discharge instructions provided?: Yes (thoroughly reviewed with patient and his caregiver/ spouse who verbalizes good understanding of same) Medications obtained,verified, and reconciled?: Yes (Medications Reviewed) (Full medication reconciliation/ review completed; no concerns or discrepancies identified; confirmed patient obtained/ is taking all newly Rx'd medications as instructed; spouse-manages medications and denies questions/ concerns around medications today) Any new allergies  since your discharge?: No Dietary orders reviewed?: Yes Type of Diet Ordered:: "Soft easily digestable foods- whatever we can get in him" Do you have support at home?: Yes People in Home: spouse, child(ren), adult Name of Support/Comfort Primary Source: Reports essentially independent in self-care activities; spouse supervises- assists as/ if needed/ indicated  Medications Reviewed Today: Medications Reviewed Today     Reviewed by Michaela Corner, RN (Registered Nurse) on 11/02/23 at 1634  Med List Status: <None>   Medication Order Taking? Sig Documenting Provider Last Dose Status Informant  aluminum-magnesium hydroxide-simethicone (MAALOX) 200-200-20 MG/5ML SUSP [id number] Yes Take 15 mLs by mouth as needed (As needed). [provider] Taking Active Spouse/Significant Other, Nursing Home Medication Administration Guide (MAG), Self  apixaban (ELIQUIS) 5 MG TABS tablet [id number] Yes Take 1 tablet by mouth twice daily  Patient taking differently: Take 5 mg by mouth See admin instructions. Take 5 mg by mouth in the morning and afternoon   [name], MD Taking Active Spouse/Significant Other, Nursing Home Medication Administration Guide (MAG), Self           Med Note Rolan Lipa, SUSAN L   Wed Aug 30, 2023  9:14 AM)    atorvastatin (LIPITOR) 40 MG tablet [id number] Yes Take 40 mg by mouth daily. [provider] Taking Active Spouse/Significant Other, Nursing Home Medication Administration Guide (MAG), Self  dronabinol ([name]) 2.5 MG capsule [id number] Yes Take 1 capsule (2.5 mg total) by mouth 2 (two) times daily before lunch and supper. Pollyann Samples, NP Taking Active Spouse/Significant Other, Nursing Home Medication Administration Guide (MAG), Self  EPINEPHrine (EPIPEN 2-PAK) 0.3 mg/0.3 mL IJ SOAJ injection [id number] Yes Inject 0.3 mg into the muscle as needed for anaphylaxis. Wanda Plump, MD Taking Active Spouse/Significant Other, Nursing Home Medication Administration Guide  (MAG), Self  Med Note Antony Madura, Arn Medal   Wed Sep 27, 2023  5:32 PM)    HYDROmorphone (DILAUDID) 4 MG tablet [id number] Yes Take 1-2 tablets (4-8 mg total) by mouth every 4 (four) hours as needed for severe pain (pain score 7-10). Rana Snare, NP Taking Active   lidocaine-prilocaine (EMLA) cream [id number] Yes Apply 1 Application topically as needed (Apply to [facility] 1-2 hours prior to its use). Ladene Artist, MD Taking Active Spouse/Significant Other, Nursing Home Medication Administration Guide (MAG), Self  metoCLOPramide (REGLAN) 10 MG tablet [id number] Yes Take 1 tablet (10 mg total) by mouth 4 (four) times daily -  before meals and at bedtime. Lewie Chamber, MD Taking Active   Nicotine (NICODERM CQ TD) [id number] Yes Place 1 patch onto the skin daily as needed (for smoking cessation). [provider] Taking Active Spouse/Significant Other, Nursing Home Medication Administration Guide (MAG), Self           Med Note Antony Madura, Melody Comas Sep 27, 2023  5:56 PM) PROVIDER: The patient's wife said (that) although he has not yet started using these at home, he will need a patch while hospitalized. She described him as a "very light" smoker.  ondansetron (ZOFRAN) 8 MG tablet [id number] Yes TAKE 1 TABLET BY MOUTH EVERY 8 HOURS AS NEEDED (START  3  DAYS  AFTER  CHEMO  AS  NEEDED  FOR  NAUSEA/VOMITING).  Patient taking differently: Take 8 mg by mouth every 8 (eight) hours as needed for nausea or vomiting (start 3 days after chemotherapy).   Ladene Artist, MD Taking Active Spouse/Significant Other, Nursing Home Medication Administration Guide (MAG), Self  pantoprazole (PROTONIX) 40 MG tablet [id number] Yes Take 1 tablet (40 mg total) by mouth 2 (two) times daily. Rana Snare, NP Taking Active Spouse/Significant Other, Nursing Home Medication Administration Guide (MAG), Self  polyethylene glycol (MIRALAX / GLYCOLAX) 17 g packet [id number] Yes Take 17 g by mouth daily as needed for mild  constipation. Dorcas Carrow, MD Taking Active Spouse/Significant Other, Nursing Home Medication Administration Guide (MAG), Self  potassium chloride (KLOR-CON) 10 MEQ tablet [id number] Yes Take 10 mEq by mouth 2 (two) times daily. [provider] Taking Active Spouse/Significant Other, Nursing Home Medication Administration Guide (MAG), Self  prochlorperazine (COMPAZINE) 10 MG tablet [id number] Yes TAKE 1 TABLET BY MOUTH EVERY 6 HOURS AS NEEDED FOR NAUSEA FOR VOMITING  Patient taking differently: Take 10 mg by mouth every 6 (six) hours as needed for nausea or vomiting.   Ladene Artist, MD Taking Active Spouse/Significant Other, Nursing Home Medication Administration Guide (MAG), Self  sennosides-docusate sodium (SENOKOT-S) 8.6-50 MG tablet [id number] Yes Take 2 tablets by mouth 2 (two) times daily as needed for constipation (hold for diarrhea). [provider] Taking Active Spouse/Significant Other, Nursing Home Medication Administration Guide (MAG), Self  sorbitol 70 % SOLN [id number] Yes Take 15 ml by mouth every 6 hours until BM occurs as directed by physician.  Then take 15 ml daily as needed for constipation. Ladene Artist, MD Taking Active Spouse/Significant Other, Nursing Home Medication Administration Guide (MAG), Self  sucralfate (CARAFATE) 1 g tablet [id number] Yes Take 1 tablet (1 g total) by mouth 4 (four) times daily -  with meals and at bedtime. Dissolve tablet in water to make liquid Truett Perna, Leighton Roach, MD Taking Active Spouse/Significant Other, Nursing Home Medication Administration Guide (MAG), Self           Home Care and Equipment/Supplies: Were Home Health Services Ordered?:  No Any new equipment or medical supplies ordered?: No  Functional Questionnaire: Do you need assistance with bathing/showering or dressing?: Yes (spouse supervises- assists) Do you need assistance with meal preparation?: Yes (spouse prepares all meals) Do you need assistance with  eating?: No Do you have difficulty maintaining continence: No Do you need assistance with getting out of bed/getting out of a chair/moving?: Yes (spouse supervises- assists as/ if needed/ indicated: confirm,s patient has walker and cane, but is not curently using) Do you have difficulty managing or taking your medications?: Yes (spouse manages all aspects of medication admministration)  Follow up appointments reviewed: PCP Follow-up appointment confirmed?: NA (verified not indicated per hospital discharging provider discharge notes) Specialist Hospital Follow-up appointment confirmed?: Yes Date of Specialist follow-up appointment?: 11/15/23 (verified this is recommended time frame for follow up per hospital discharging provider notes) Follow-Up Specialty Provider:: oncology provider Do you need transportation to your follow-up appointment?: No Do you understand care options if your condition(s) worsen?: Yes-patient verbalized understanding  SDOH Interventions Today    Flowsheet Row Most Recent Value  SDOH Interventions   Food Insecurity Interventions Intervention Not Indicated  [Today, during TOC call, patient denies food insecurity]  Housing Interventions Intervention Not Indicated  Transportation Interventions Intervention Not Indicated  [spouse provides transportation]  Utilities Interventions Intervention Not Indicated       Interventions Today    Flowsheet Row Most Recent Value  Chronic Disease   Chronic disease during today's visit Other  [stage IV metastatic gastric CA]  General Interventions   General Interventions Discussed/Reviewed General Interventions Discussed, Durable Medical Equipment (DME), Doctor Visits  Doctor Visits Discussed/Reviewed Specialist, Doctor Visits Discussed  Durable Medical Equipment (DME) Val Riles not currently requiring/ using assistive devices for ambulation - has cane/ walker for prn use]  PCP/Specialist Visits Compliance with follow-up  visit  Education Interventions   Education Provided Provided Education  Provided Verbal Education On Medication, Insurance Plans, When to see the doctor  Nutrition Interventions   Nutrition Discussed/Reviewed Nutrition Discussed  Pharmacy Interventions   Pharmacy Dicussed/Reviewed Pharmacy Topics Discussed  [Full medication review with updating medication list in EHR per patient report]  Safety Interventions   Safety Discussed/Reviewed Safety Discussed, Fall Risk  [provided education/ reinforcement around fall prevention]      TOC Interventions Today    Flowsheet Row Most Recent Value  TOC Interventions   TOC Interventions Discussed/Reviewed TOC Interventions Discussed  [Spouse declines need for ongoing/ further care management/ coordination outreach,  declines enrollment in 30-day TOC program- declines taking my direct phone number should needs/ concerns arise post-TOC call]      Total time spent from review to signing of note/ including any care coordination interventions:  54 minutes  Pls call/ message for questions,  Caryl Pina, RN, BSN, Media planner  Transitions of Care  [facility] [phone]: direct office

## 2023-11-03 LAB — PATHOLOGIST SMEAR REVIEW

## 2023-11-06 ENCOUNTER — Encounter: Payer: Self-pay | Admitting: Gastroenterology

## 2023-11-06 NOTE — Progress Notes (Signed)
 [name],  The biopsies of your esophagus showed inflammatory changes, but no evidence of infection.  The inflammation in your esophagus is most likely from stomach acid from frequent vomiting, or acid reflux.  Please take the pantoprazole twice daily and carafate 4 times a day as instructed to help the esophagitis heal.

## 2023-11-08 ENCOUNTER — Inpatient Hospital Stay

## 2023-11-08 ENCOUNTER — Encounter: Payer: Self-pay | Admitting: Nurse Practitioner

## 2023-11-08 ENCOUNTER — Telehealth: Payer: Self-pay

## 2023-11-08 ENCOUNTER — Inpatient Hospital Stay: Attending: Oncology

## 2023-11-08 ENCOUNTER — Encounter: Payer: Self-pay | Admitting: Oncology

## 2023-11-08 ENCOUNTER — Inpatient Hospital Stay (HOSPITAL_BASED_OUTPATIENT_CLINIC_OR_DEPARTMENT_OTHER): Admitting: Nurse Practitioner

## 2023-11-08 DIAGNOSIS — B370 Candidal stomatitis: Secondary | ICD-10-CM | POA: Insufficient documentation

## 2023-11-08 DIAGNOSIS — R531 Weakness: Secondary | ICD-10-CM | POA: Diagnosis not present

## 2023-11-08 DIAGNOSIS — Z86718 Personal history of other venous thrombosis and embolism: Secondary | ICD-10-CM | POA: Diagnosis not present

## 2023-11-08 DIAGNOSIS — C163 Malignant neoplasm of pyloric antrum: Secondary | ICD-10-CM

## 2023-11-08 DIAGNOSIS — C169 Malignant neoplasm of stomach, unspecified: Secondary | ICD-10-CM | POA: Diagnosis not present

## 2023-11-08 DIAGNOSIS — E876 Hypokalemia: Secondary | ICD-10-CM | POA: Insufficient documentation

## 2023-11-08 DIAGNOSIS — Z5189 Encounter for other specified aftercare: Secondary | ICD-10-CM | POA: Diagnosis not present

## 2023-11-08 DIAGNOSIS — Z5112 Encounter for antineoplastic immunotherapy: Secondary | ICD-10-CM | POA: Insufficient documentation

## 2023-11-08 DIAGNOSIS — G893 Neoplasm related pain (acute) (chronic): Secondary | ICD-10-CM | POA: Insufficient documentation

## 2023-11-08 DIAGNOSIS — Z7901 Long term (current) use of anticoagulants: Secondary | ICD-10-CM | POA: Insufficient documentation

## 2023-11-08 DIAGNOSIS — Z5111 Encounter for antineoplastic chemotherapy: Secondary | ICD-10-CM | POA: Diagnosis present

## 2023-11-08 DIAGNOSIS — R627 Adult failure to thrive: Secondary | ICD-10-CM | POA: Insufficient documentation

## 2023-11-08 DIAGNOSIS — Z96642 Presence of left artificial hip joint: Secondary | ICD-10-CM | POA: Diagnosis not present

## 2023-11-08 DIAGNOSIS — R112 Nausea with vomiting, unspecified: Secondary | ICD-10-CM | POA: Diagnosis not present

## 2023-11-08 LAB — CBC WITH DIFFERENTIAL (CANCER CENTER ONLY)
Abs Immature Granulocytes: 5.44 10*3/uL — ABNORMAL HIGH (ref 0.00–0.07)
Basophils Absolute: 0.1 10*3/uL (ref 0.0–0.1)
Basophils Relative: 0 %
Eosinophils Absolute: 0 10*3/uL (ref 0.0–0.5)
Eosinophils Relative: 0 %
HCT: 33.8 % — ABNORMAL LOW (ref 39.0–52.0)
Hemoglobin: 11.2 g/dL — ABNORMAL LOW (ref 13.0–17.0)
Immature Granulocytes: 13 %
Lymphocytes Relative: 5 %
Lymphs Abs: 2.2 10*3/uL (ref 0.7–4.0)
MCH: 28.6 pg (ref 26.0–34.0)
MCHC: 33.1 g/dL (ref 30.0–36.0)
MCV: 86.2 fL (ref 80.0–100.0)
Monocytes Absolute: 3.4 10*3/uL — ABNORMAL HIGH (ref 0.1–1.0)
Monocytes Relative: 8 %
Neutro Abs: 31.8 10*3/uL — ABNORMAL HIGH (ref 1.7–7.7)
Neutrophils Relative %: 74 %
Platelet Count: 392 10*3/uL (ref 150–400)
RBC: 3.92 MIL/uL — ABNORMAL LOW (ref 4.22–5.81)
RDW: 18.7 % — ABNORMAL HIGH (ref 11.5–15.5)
Smear Review: ADEQUATE
WBC Count: 42.9 10*3/uL — ABNORMAL HIGH (ref 4.0–10.5)
nRBC: 0.1 % (ref 0.0–0.2)

## 2023-11-08 LAB — CMP (CANCER CENTER ONLY)
ALT: 7 U/L (ref 0–44)
AST: 13 U/L — ABNORMAL LOW (ref 15–41)
Albumin: 2.9 g/dL — ABNORMAL LOW (ref 3.5–5.0)
Alkaline Phosphatase: 125 U/L (ref 38–126)
Anion gap: 10 (ref 5–15)
BUN: 12 mg/dL (ref 6–20)
CO2: 30 mmol/L (ref 22–32)
Calcium: 9.3 mg/dL (ref 8.9–10.3)
Chloride: 92 mmol/L — ABNORMAL LOW (ref 98–111)
Creatinine: 0.7 mg/dL (ref 0.61–1.24)
GFR, Estimated: >60 mL/min (ref >60)
Glucose, Bld: 120 mg/dL — ABNORMAL HIGH (ref 70–99)
Potassium: 3.9 mmol/L (ref 3.5–5.1)
Sodium: 132 mmol/L — ABNORMAL LOW (ref 135–145)
Total Bilirubin: 0.5 mg/dL (ref 0.0–1.2)
Total Protein: 6.1 g/dL — ABNORMAL LOW (ref 6.5–8.1)

## 2023-11-08 MED ORDER — HYDROMORPHONE HCL 4 MG PO TABS: 4.0000 mg | ORAL_TABLET | ORAL | 0 refills | Status: DC | PRN

## 2023-11-08 MED ORDER — SODIUM CHLORIDE 0.9 % IV SOLN: Freq: Once | INTRAVENOUS | Status: AC

## 2023-11-08 MED ORDER — DEXAMETHASONE 4 MG PO TABS: 4.0000 mg | ORAL_TABLET | Freq: Two times a day (BID) | ORAL | 0 refills | Status: DC

## 2023-11-08 MED ORDER — OXYCODONE HCL ER 20 MG PO T12A
20.0000 mg | EXTENDED_RELEASE_TABLET | Freq: Two times a day (BID) | ORAL | 0 refills | Status: DC
Start: 2023-11-08 — End: 2023-11-10

## 2023-11-08 MED ORDER — HEPARIN SOD (PORK) LOCK FLUSH 100 UNIT/ML IV SOLN
500.0000 [IU] | Freq: Once | INTRAVENOUS | Status: AC
  Administered 2023-11-08: 500 [IU] via INTRAVENOUS

## 2023-11-08 NOTE — Patient Instructions (Signed)

## 2023-11-08 NOTE — Progress Notes (Signed)
 Patient seen by Santiago Glad, NP today  Vitals are within treatment parameters:Yes  Pulse- 105, BP-111/97, decreased 15 pounds Labs are within treatment parameters: Yes  AST- 13 Treatment plan has been signed: Yes   Per physician team, Patient will not be receiving treatment today.  Per Provider- IVF 1 liter NS over 2 hours

## 2023-11-08 NOTE — Telephone Encounter (Signed)
 Notified patient of prior authorization approval for Oxycontin 20mg  Tablets. Medication is approved through 05/09/2024. Pharmacy notified. No other needs or concerns noted at this time.

## 2023-11-08 NOTE — Progress Notes (Addendum)
 Patient Care Team: Wanda Plump, MD as PCP - Jerelene Redden, MD as PCP - Cardiology (Cardiology) Axel Filler, MD as Consulting Physician (General Surgery) Wyline Mood, RN (Inactive) as [facility] Care Management   CHIEF COMPLAINT: Follow up gastric cancer  CURRENT THERAPY: FOLFOX/Nivolumab q14 days  INTERVAL HISTORY Mr. Spadaccini returns for follow up as scheduled. Last seen by me 10/18/23 with cycle 5 FOLFOX/Nivo. He called our office 10/23/23 reporting persistent N/V despite home meds and was directed to the ED. He was admitted, CT showed ascites, possible ileus, and concern for peritoneal carcinomatosis. He underwent paracentesis. EGD postponed to 3/25 due to febrile neutropenia, treated with abx, cultures negative. Endoscopy showed esophagitis and small hernia, no GOO or any role for stenting. Path negative for viral or fungal etiology. He was placed on liquid diet with goal to advance to mechanical soft diet. Discharged from hospital 11/01/23.   Today he presents with his wife, feeling weak. Rests most of the day, watches movies, sedentary. Needing assistance to get up and dress now. Tries to drink (1 bottle water, 1 ensure, juice) and eat soft diet such as jello and baby food. Wife feels he is keeping po down longer but it always comes back up despite meds. Urinating twice per day, BMs 2 per week which are black. Wife thinks he is confused at times. Denies dysuria. Has started coughing some white phlegm. No fever/chills. Overall, pt thinks pain has improved on chemo, but still gets intermittent sharp pains in the side/RUQ. Takes dilaudid 2 tabs q6h, pain relief wears off after 3-5 hours.   ROS  All other systems reviewed and negative  Past Medical History:  Diagnosis Date   Chest pain    stres test neg x 2, cath 5/12; minimal LAD irregs; no obs CAD; normal LVF   Clotting disorder (HCC)    dvt   Deep vein thrombosis (HCC) 10/06/2010   GERD (gastroesophageal reflux disease)     History of DVT of lower extremity    on chronic coumadin   Hypertension    Internal hemorrhoids    Cscope 2007   PAD (peripheral artery disease) (HCC)    a. left leg ischemia tx wtih embolectomy of left fem, pop, tib arteries with Dr. Edilia Bo - 08/2006;  b. known occlusion of right pop with collats - med Rx;  c.ABI's 8/09: R 1.0; L 0.91    PFO (patent foramen ovale)    per 08/2006 discharge summary, TEE showed EF 60%, small PFO with minimal right-to-left shunt with Valsalva; not felt to be the source of emboli, lifelong Coumadin recommended   Pneumonia    history of   Polysubstance abuse (HCC)    marijuana only   Renal infarct [facility])    R     Past Surgical History:  Procedure Laterality Date   BIOPSY  07/18/2023   Procedure: BIOPSY;  Surgeon: Sherrilyn Rist, MD;  Location: [facility] END[facility]COPY;  Service: Gastroenterology;;   BIOPSY OF SKIN SUBCUTANEOUS TISSUE AND/OR MUCOUS MEMBRANE  10/31/2023   Procedure: BIOPSY, SKIN, SUBCUTANEOUS TISSUE, OR MUCOUS MEMBRANE;  Surgeon: Jenel Lucks, MD;  Location: [facility]OSCOPY;  Service: Gastroenterology;;   CHOLECYSTECTOMY     COLONOSCOPY     ESOPHAGOGASTRODUODENOSCOPY N/A 10/31/2023   Procedure: EGD (ESOPHAGOGASTRODUODENOSCOPY);  Surgeon: Jenel Lucks, MD;  Location: [facility];  Service: Gastroenterology;  Laterality: N/A;   ESOPHAGOGASTRODUODENOSCOPY (EGD) WITH PROPOFOL N/A 07/18/2023   Procedure: ESOPHAGOGASTRODUODENOSCOPY (EGD) WITH PROPOFOL;  Surgeon: [name],  MD;  Location: [facility]OS[facility];  Service: Gastroenterology;  Laterality: N/A;   INGUINAL HERNIA REPAIR Left 07/16/2019   Procedure: LAPAROSCOPIC LEFT INGUINAL HERNIA REPAIR WITH MESH;  Surgeon: Axel Filler, MD;  Location: [facility] OR;  Service: General;  Laterality: Left;   IR IMAGING GUIDED PORT INSERTION  08/08/2023   IR PARACENTESIS  07/17/2023   IR PARACENTESIS  10/20/2023   left leg blood clot removal 2009  2009   TONSILLECTOMY     TOTAL HIP ARTHROPLASTY Left  02/03/2023   Procedure: LEFT TOTAL HIP ARTHROPLASTY ANTERIOR APPROACH;  Surgeon: Tarry Kos, MD;  Location: [facility] OR;  Service: Orthopedics;  Laterality: Left;  3-C   WRIST SURGERY Left      Outpatient Encounter Medications as of 11/08/2023  Medication Sig Note   aluminum-magnesium hydroxide-simethicone (MAALOX) 200-200-20 MG/5ML SUSP Take 15 mLs by mouth as needed (As needed).    apixaban (ELIQUIS) 5 MG TABS tablet Take 1 tablet by mouth twice daily (Patient taking differently: Take 5 mg by mouth See admin instructions. Take 5 mg by mouth in the morning and afternoon)    atorvastatin (LIPITOR) 40 MG tablet Take 40 mg by mouth daily.    dexamethasone (DECADRON) 4 MG tablet Take 1 tablet (4 mg total) by mouth 2 (two) times daily.    dronabinol ([name]) 2.5 MG capsule Take 1 capsule (2.5 mg total) by mouth 2 (two) times daily before lunch and supper.    lidocaine-prilocaine (EMLA) cream Apply 1 Application topically as needed (Apply to Port 1-2 hours prior to its use).    metoCLOPramide (REGLAN) 10 MG tablet Take 1 tablet (10 mg total) by mouth 4 (four) times daily -  before meals and at bedtime.    Nicotine (NICODERM CQ TD) Place 1 patch onto the skin daily as needed (for smoking cessation). 09/27/2023: PROVIDER: The patient's wife said (that) although he has not yet started using these at home, he will need a patch while hospitalized. She described him as a "very light" smoker.   ondansetron (ZOFRAN) 8 MG tablet TAKE 1 TABLET BY MOUTH EVERY 8 HOURS AS NEEDED (START  3  DAYS  AFTER  CHEMO  AS  NEEDED  FOR  NAUSEA/VOMITING). (Patient taking differently: Take 8 mg by mouth every 8 (eight) hours as needed for nausea or vomiting (start 3 days after chemotherapy).)    oxyCODONE (OXYCONTIN) 20 mg 12 hr tablet Take 1 tablet (20 mg total) by mouth every 12 (twelve) hours. Gastric cancer, stage IV    pantoprazole (PROTONIX) 40 MG tablet Take 1 tablet (40 mg total) by mouth 2 (two) times daily.    polyethylene  glycol (MIRALAX / GLYCOLAX) 17 g packet Take 17 g by mouth daily as needed for mild constipation.    potassium chloride (KLOR-CON) 10 MEQ tablet Take 10 mEq by mouth 2 (two) times daily.    prochlorperazine (COMPAZINE) 10 MG tablet TAKE 1 TABLET BY MOUTH EVERY 6 HOURS AS NEEDED FOR NAUSEA FOR VOMITING (Patient taking differently: Take 10 mg by mouth every 6 (six) hours as needed for nausea or vomiting.)    sennosides-docusate sodium (SENOKOT-S) 8.6-50 MG tablet Take 2 tablets by mouth 2 (two) times daily as needed for constipation (hold for diarrhea).    sucralfate (CARAFATE) 1 g tablet Take 1 tablet (1 g total) by mouth 4 (four) times daily -  with meals and at bedtime. Dissolve tablet in water to make liquid    [DISCONTINUED] HYDROmorphone (DILAUDID) 4 MG tablet Take 1-2 tablets (  4-8 mg total) by mouth every 4 (four) hours as needed for severe pain (pain score 7-10).    EPINEPHrine (EPIPEN 2-PAK) 0.3 mg/0.3 mL IJ SOAJ injection Inject 0.3 mg into the muscle as needed for anaphylaxis. (Patient not taking: Reported on 11/08/2023)    HYDROmorphone (DILAUDID) 4 MG tablet Take 1-2 tablets (4-8 mg total) by mouth every 4 (four) hours as needed for severe pain (pain score 7-10).    sorbitol 70 % SOLN Take 15 ml by mouth every 6 hours until BM occurs as directed by physician.  Then take 15 ml daily as needed for constipation. (Patient not taking: Reported on 11/08/2023)    No facility-administered encounter medications on file as of 11/08/2023.     Today's Vitals   11/08/23 0811 11/08/23 0812 11/08/23 0839 11/08/23 0841  BP: (!) 115/99 (!) 111/97    Pulse: (!) 117  (!) 105 (!) 105  Resp: 20     Temp: 97.9 F (36.6 C)     TempSrc: Temporal     SpO2: 100%     Weight: 122 lb (55.3 kg)     Height: 6\' 1"  (1.854 m)     PainSc:    5    Body mass index is 16.1 kg/m.   PHYSICAL EXAM GENERAL: awake/alert, cachetic, no distress SKIN: no rash  EYES: sclera clear LUNGS: clear with normal breathing  effort HEART: tachycardic, regular rhythm; no lower extremity edema ABDOMEN: abdomen soft, non-tender and normal bowel sounds NEURO: alert & oriented x 3 with fluent speech, generalized weakness PAC without erythema    CBC    Component Value Date/Time   WBC 42.9 (H) 11/08/2023 0755   WBC 26.3 (H) 11/01/2023 0439   RBC 3.92 (L) 11/08/2023 0755   HGB 11.2 (L) 11/08/2023 0755   HGB 14.5 04/27/2021 0812   HCT 33.8 (L) 11/08/2023 0755   HCT 43.5 04/27/2021 0812   PLT 392 11/08/2023 0755   PLT 225 04/27/2021 0812   MCV 86.2 11/08/2023 0755   MCV 96 04/27/2021 0812   MCH 28.6 11/08/2023 0755   MCHC 33.1 11/08/2023 0755   RDW 18.7 (H) 11/08/2023 0755   RDW 13.7 04/27/2021 0812   LYMPHSABS 2.2 11/08/2023 0755   MONOABS 3.4 (H) 11/08/2023 0755   EOSABS 0.0 11/08/2023 0755   BASOSABS 0.1 11/08/2023 0755     CMP     Component Value Date/Time   NA 132 (L) 11/08/2023 0755   NA 139 05/24/2022 0926   K 3.9 11/08/2023 0755   CL 92 (L) 11/08/2023 0755   CO2 30 11/08/2023 0755   GLUCOSE 120 (H) 11/08/2023 0755   BUN 12 11/08/2023 0755   BUN 11 05/24/2022 0926   CREATININE 0.70 11/08/2023 0755   CALCIUM 9.3 11/08/2023 0755   PROT 6.1 (L) 11/08/2023 0755   PROT 6.8 08/30/2022 1006   ALBUMIN 2.9 (L) 11/08/2023 0755   ALBUMIN 4.5 08/30/2022 1006   AST 13 (L) 11/08/2023 0755   ALT 7 11/08/2023 0755   ALKPHOS 125 11/08/2023 0755   BILITOT 0.5 11/08/2023 0755   GFRNONAA >60 11/08/2023 0755   GFRAA >60 07/11/2019 0920     ASSESSMENT & PLAN: 63 yo male   Gastric cancer, stage IV CT abdomen/pelvis 07/16/2023-increased wall thickening at the gastric antrum and pylorus compared to 2016, small to moderate ascites, subcapsular hypodensities at the inferior liver-subcapsular implants?,  Diffuse soft tissue infiltration and haziness throughout the omentum and upper abdominal peritoneal fat Endoscopy 07/18/2023-congested mucosa in the gastric  antrum and biopsy biopsied: 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 Paracentesis 07/17/2023-acute inflammation, no malignant cells CT biopsy of right upper quadrant omental thickening 07/19/2023: Metastatic total differentiated carcinoma consistent with primary gastric carcinoma, normal mismatch repair protein expression , HER2 (0); foundation 1-microsatellite stable, tumor mutation burden 2, PD-L1 CPS 1; [place] PD-L1 CPS 60% POSITIVE expression of CLND18: 100% Cycle 1 FOLFOX/nivolumab 08/16/2023 Cycle 2 FOLFOX/nivolumab 08/30/2023 Cycle 3 FOLFOX/nivolumab 09/13/2023 CT abdomen/pelvis 09/28/2023: Large volume ascites, difficult to assess omentum, decreased conspicuity of hepatic hypodensities, nonspecific thickening at the pyloric region of the stomach Cycle 4 FOLFOX/nivolumab 10/04/2023, GCSF added Cycle 5 FOLFOX/nivolumab 10/18/2023, GCSF   Pain secondary to #1 History of recurrent venous thrombosis-maintained on apixaban anticoagulation Tobacco and alcohol use Family history of multiple cancers Anorexia/weight loss and constipation secondary to #1 - on multiple medications, not a candidate for abd feeding tube due to malignant ascites. Followed by Nutritionist  Left hip replacement June 2024 Hypokalemia, secondary to poor nutrition and n/v. Currently takes oral KCL BID Admission 09/27/23 - 09/29/23 for intractable N/V, negative for gastric outlet or small bowel obstruction. CT showed no disease progression. Sx improved after paracentesis (5 L) Admission 10/23/23 - 11/01/2023 for intractable N/V, CT: ascites, possible ileus, and concern for peritoneal carcinomatosis. S/p paracentesis. EGD postponed to 3/25 due to febrile neutropenia, treated with abx, cultures negative. Endoscopy [name]): esophagitis and small hernia, no GOO or any role for stenting. Path negative for viral or fungal etiology. He was placed on liquid diet with goal to advance to mechanical soft diet.      Disposition:  [name] presents with  failure to thrive. He is not tolerating soft diet, we recommend to revert back to liquid diet and continue home meds. He is being given a prescription for decadron 4 mg BID to help with N/V and weakness. Hold chemo today, will give IVF.   Patient seen with Dr. Truett Perna. We reviewed the recent hospital course including imaging, endoscopy, and path. Unfortunately there is no clear way to improve his nutritional status. We discussed that a feeding tube through the abdomen is risky due to the malignant ascites and is not typically done, and TPN is not a good long term solution. We will reach out to GI again today for further input.   We discussed that radiographically it's difficult to determine if he has responded to treatment, and although his pain has improved, the overall clinical picture indicates poor response/tolerance. We introduced that he is appropriate for comfort and hospice care, pt and spouse are not ready. We reviewed code status/advanced directives and referred him to social work to complete this process. He reflected on his family and faith; emotional support given.   Reviewed symptom management, will add long acting pain medication for additional support, given that dilaudid wears off after a few hours.  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 We discussed the most common SE's are N/V and allergic reaction. This is on hold.   After lengthy discussion we agreed to short term f/up and ongoing GOC conversation. Pt will return Monday for possible treatment if he has improved.    Orders Placed This Encounter  Procedures   Ambulatory referral to Social Work    Referral Priority:   Urgent    Referral Type:   Consultation    Referral Reason:   Specialty Services Required  Number of Visits Requested:   1      All questions were answered. The patient knows to call  the clinic with any problems, questions or concerns. No barriers to learning were detected.   Santiago Glad, NP-C 11/08/2023  This was a shared visit with Santiago Glad.  [name] was interviewed and examined.  He has developed failure to thrive and recurrent nausea/vomiting.  He continues to have abdominal pain and takes Dilaudid every 6 hours.  We discussed the difficult situation with [name] and his wife.  He is not a candidate for further systemic therapy unless his performance status improves.  We recommended he follow a liquid diet.  I will contact gastroenterology to see if there are other options for treating the nausea and maintaining nutrition.  We generally do not recommend placement of a feeding tube in patients with carcinomatosis.  TPN can be considered.  We discussed continuing systemic therapy versus comfort care.  We discussed the addition of zolbetuximab.  He indicated he wishes to continue treatment and is not want to enroll in hospice.  He will begin a trial of Decadron for nausea.  He will return for an office visit and further discussion on 11/13/2023.  I was present for greater than 50% of today's visit.  I performed medical decision making.  Mancel Bale, MD

## 2023-11-09 ENCOUNTER — Inpatient Hospital Stay

## 2023-11-09 NOTE — Progress Notes (Signed)
 [facility] CSW Progress Note  Clinical Social Worker contact patient and spouse on telephone following provider appointment. CSW assessed psychosocial needs. Patient and Spouse discussed their perception and feelings following provider appointment. CSW normalized feelings associated with treatment and disappointment that sometimes shows up. Patient and family continued to express desires and hopes, and discussed impact on each member. CSW continued the goals of care conversation. CSW will follow up via telephone after next visit.  CSW Provided education on advance directives and how to obtain one through [facility]. Patient and Spouse declined to complete Advance Directives at this time. Flowsheet and Medical Team updated.     Marguerita Merles, LCSW Clinical Social Worker [facility]

## 2023-11-10 ENCOUNTER — Encounter

## 2023-11-10 ENCOUNTER — Other Ambulatory Visit (HOSPITAL_BASED_OUTPATIENT_CLINIC_OR_DEPARTMENT_OTHER): Payer: Self-pay

## 2023-11-10 ENCOUNTER — Encounter: Payer: Self-pay | Admitting: *Deleted

## 2023-11-10 ENCOUNTER — Other Ambulatory Visit: Payer: Self-pay | Admitting: Nurse Practitioner

## 2023-11-10 ENCOUNTER — Inpatient Hospital Stay

## 2023-11-10 VITALS — BP 93/75 | HR 84 | Temp 98.1°F | Resp 18

## 2023-11-10 DIAGNOSIS — C169 Malignant neoplasm of stomach, unspecified: Secondary | ICD-10-CM

## 2023-11-10 DIAGNOSIS — Z5111 Encounter for antineoplastic chemotherapy: Secondary | ICD-10-CM | POA: Diagnosis not present

## 2023-11-10 DIAGNOSIS — C163 Malignant neoplasm of pyloric antrum: Secondary | ICD-10-CM

## 2023-11-10 MED ORDER — SODIUM CHLORIDE 0.9 % IV SOLN: INTRAVENOUS | Status: DC

## 2023-11-10 MED ORDER — HEPARIN SOD (PORK) LOCK FLUSH 100 UNIT/ML IV SOLN
500.0000 [IU] | Freq: Once | INTRAVENOUS | Status: AC
  Administered 2023-11-10: 500 [IU] via INTRAVENOUS

## 2023-11-10 MED ORDER — OXYCODONE HCL ER 20 MG PO T12A
20.0000 mg | EXTENDED_RELEASE_TABLET | Freq: Two times a day (BID) | ORAL | 0 refills | Status: DC
Start: 2023-11-10 — End: 2024-01-09
  Filled 2023-11-10: qty 60, 30d supply, fill #0

## 2023-11-10 NOTE — Progress Notes (Signed)
 [facility] does not have oxycontin in stock, but can order to have on Tues/Wed. [facility] can order with delivery on Monday. Family chooses to fill at [facility]. Notified [facility] to cancel the script.

## 2023-11-10 NOTE — Patient Instructions (Signed)

## 2023-11-13 ENCOUNTER — Other Ambulatory Visit: Payer: Self-pay

## 2023-11-13 ENCOUNTER — Encounter: Payer: Self-pay | Admitting: Oncology

## 2023-11-13 ENCOUNTER — Inpatient Hospital Stay

## 2023-11-13 ENCOUNTER — Inpatient Hospital Stay (HOSPITAL_BASED_OUTPATIENT_CLINIC_OR_DEPARTMENT_OTHER): Admitting: Nurse Practitioner

## 2023-11-13 ENCOUNTER — Encounter: Payer: Self-pay | Admitting: Nurse Practitioner

## 2023-11-13 ENCOUNTER — Other Ambulatory Visit (HOSPITAL_BASED_OUTPATIENT_CLINIC_OR_DEPARTMENT_OTHER): Payer: Self-pay

## 2023-11-13 VITALS — BP 111/82 | HR 89

## 2023-11-13 DIAGNOSIS — C163 Malignant neoplasm of pyloric antrum: Secondary | ICD-10-CM

## 2023-11-13 DIAGNOSIS — Z5111 Encounter for antineoplastic chemotherapy: Secondary | ICD-10-CM | POA: Diagnosis not present

## 2023-11-13 DIAGNOSIS — C169 Malignant neoplasm of stomach, unspecified: Secondary | ICD-10-CM | POA: Diagnosis not present

## 2023-11-13 LAB — CMP (CANCER CENTER ONLY)
ALT: 15 U/L (ref 0–44)
AST: 18 U/L (ref 15–41)
Albumin: 3 g/dL — ABNORMAL LOW (ref 3.5–5.0)
Alkaline Phosphatase: 74 U/L (ref 38–126)
Anion gap: 8 (ref 5–15)
BUN: 10 mg/dL (ref 6–20)
CO2: 27 mmol/L (ref 22–32)
Calcium: 8.9 mg/dL (ref 8.9–10.3)
Chloride: 98 mmol/L (ref 98–111)
Creatinine: 0.52 mg/dL — ABNORMAL LOW (ref 0.61–1.24)
GFR, Estimated: >60 mL/min (ref >60)
Glucose, Bld: 108 mg/dL — ABNORMAL HIGH (ref 70–99)
Potassium: 3.2 mmol/L — ABNORMAL LOW (ref 3.5–5.1)
Sodium: 133 mmol/L — ABNORMAL LOW (ref 135–145)
Total Bilirubin: 0.4 mg/dL (ref 0.0–1.2)
Total Protein: 5.6 g/dL — ABNORMAL LOW (ref 6.5–8.1)

## 2023-11-13 LAB — CBC WITH DIFFERENTIAL (CANCER CENTER ONLY)
Abs Immature Granulocytes: 2.87 10*3/uL — ABNORMAL HIGH (ref 0.00–0.07)
Basophils Absolute: 0.2 10*3/uL — ABNORMAL HIGH (ref 0.0–0.1)
Basophils Relative: 1 %
Eosinophils Absolute: 0 10*3/uL (ref 0.0–0.5)
Eosinophils Relative: 0 %
HCT: 32.3 % — ABNORMAL LOW (ref 39.0–52.0)
Hemoglobin: 10.5 g/dL — ABNORMAL LOW (ref 13.0–17.0)
Immature Granulocytes: 10 %
Lymphocytes Relative: 9 %
Lymphs Abs: 2.8 10*3/uL (ref 0.7–4.0)
MCH: 28.7 pg (ref 26.0–34.0)
MCHC: 32.5 g/dL (ref 30.0–36.0)
MCV: 88.3 fL (ref 80.0–100.0)
Monocytes Absolute: 2.1 10*3/uL — ABNORMAL HIGH (ref 0.1–1.0)
Monocytes Relative: 7 %
Neutro Abs: 21.9 10*3/uL — ABNORMAL HIGH (ref 1.7–7.7)
Neutrophils Relative %: 73 %
Platelet Count: 374 10*3/uL (ref 150–400)
RBC: 3.66 MIL/uL — ABNORMAL LOW (ref 4.22–5.81)
RDW: 19.8 % — ABNORMAL HIGH (ref 11.5–15.5)
Smear Review: ADEQUATE
WBC Count: 29.9 10*3/uL — ABNORMAL HIGH (ref 4.0–10.5)
nRBC: 0.7 % — ABNORMAL HIGH (ref 0.0–0.2)

## 2023-11-13 LAB — MAGNESIUM: Magnesium: 1.4 mg/dL — ABNORMAL LOW (ref 1.7–2.4)

## 2023-11-13 MED ORDER — MAGNESIUM SULFATE 2 GM/50ML IV SOLN: 2.0000 g | Freq: Once | INTRAVENOUS | Status: DC

## 2023-11-13 MED ORDER — DEXAMETHASONE 4 MG PO TABS
4.0000 mg | ORAL_TABLET | Freq: Every day | ORAL | 0 refills | Status: DC
  Filled 2023-11-13: qty 30, 30d supply, fill #0

## 2023-11-13 MED ORDER — MAGNESIUM SULFATE 4 GM/100ML IV SOLN
4.0000 g | Freq: Once | INTRAVENOUS | Status: AC
  Administered 2023-11-13: 4 g via INTRAVENOUS
  Filled 2023-11-13: qty 100

## 2023-11-13 MED ORDER — DEXTROSE 5 % IV SOLN: INTRAVENOUS | Status: DC

## 2023-11-13 MED ORDER — PALONOSETRON HCL INJECTION 0.25 MG/5ML
0.2500 mg | Freq: Once | INTRAVENOUS | Status: AC
  Administered 2023-11-13: 0.25 mg via INTRAVENOUS
  Filled 2023-11-13: qty 5

## 2023-11-13 MED ORDER — SODIUM CHLORIDE 0.9 % IV SOLN
240.0000 mg | Freq: Once | INTRAVENOUS | Status: AC
  Administered 2023-11-13: 240 mg via INTRAVENOUS
  Filled 2023-11-13: qty 24

## 2023-11-13 MED ORDER — LEUCOVORIN CALCIUM INJECTION 350 MG
400.0000 mg/m2 | Freq: Once | INTRAVENOUS | Status: AC
  Administered 2023-11-13: 740 mg via INTRAVENOUS
  Filled 2023-11-13: qty 25

## 2023-11-13 MED ORDER — SODIUM CHLORIDE 0.9 % IV SOLN
2400.0000 mg/m2 | INTRAVENOUS | Status: DC
  Administered 2023-11-13: 4450 mg via INTRAVENOUS
  Filled 2023-11-13: qty 89

## 2023-11-13 MED ORDER — DEXAMETHASONE SODIUM PHOSPHATE 10 MG/ML IJ SOLN
10.0000 mg | Freq: Once | INTRAMUSCULAR | Status: AC
  Administered 2023-11-13: 10 mg via INTRAVENOUS
  Filled 2023-11-13: qty 1

## 2023-11-13 MED ORDER — DEXTROSE 5 % IV SOLN
65.0000 mg/m2 | Freq: Once | INTRAVENOUS | Status: AC
  Administered 2023-11-13: 120 mg via INTRAVENOUS
  Filled 2023-11-13: qty 20

## 2023-11-13 MED ORDER — FLUCONAZOLE 100 MG PO TABS
100.0000 mg | ORAL_TABLET | Freq: Every day | ORAL | 0 refills | Status: AC
  Filled 2023-11-13: qty 4, 4d supply, fill #0

## 2023-11-13 NOTE — Progress Notes (Signed)
 Patient seen by Lonna Cobb NP today  Vitals are within treatment parameters:Yes   Labs are within treatment parameters: Yes potassium 3.2  Treatment plan has been signed: Yes   Per physician team, Patient is ready for treatment and there are NO modifications to the treatment plan.

## 2023-11-13 NOTE — Progress Notes (Signed)
 Patient complained of indigestion around 13:15. Wife gave patient a dose of home pantoprazole 40 mg (pt had not taken AM dose before coming in for appointments). Around 16:45 patient complained of worsening indigestion and proceeded to vomit a small amount of clear/liquid emesis. Vitals taken and WNL. Patient reported feeling better after vomiting. Will continue to monitor and route this message to patient's providers to review   11/13/23 1650  Vitals  BP 111/82  BP Location Left Arm  Patient Position Sitting  Pulse Rate 89

## 2023-11-13 NOTE — Progress Notes (Signed)
 [name]   Diagnosis: Gastric cancer  INTERVAL HISTORY:   [name] returns as scheduled.  He is feeling much better.  He has not vomited for 5 days.  He denies nausea.  Bowels are moving.  He is eating soft solids.  He estimates 2-3 smoothies a day.  He has gained weight.  He feels his pain is well-controlled.  He is taking Dilaudid 2 or 3 times a day.  He has not started OxyContin.  Objective:  Vital signs in last 24 hours:  Blood pressure 94/83, pulse (!) 101, temperature 98.1 F (36.7 C), temperature source Temporal, resp. rate 18, height 6\' 1"  (1.854 m), weight 125 lb 1.6 oz (56.7 kg), SpO2 100%.    HEENT: Thrush.  No ulcers. Resp: Lungs clear bilaterally. Cardio: Regular rate and rhythm. GI: Abdomen soft, nontender.  No hepatosplenomegaly.  No mass.  Question trace ascites. Vascular: No leg edema. Neuro: Alert and oriented. Port-A-Cath without erythema.  Lab Results:  Lab Results  Component Value Date   WBC 29.9 (H) 11/13/2023   HGB 10.5 (L) 11/13/2023   HCT 32.3 (L) 11/13/2023   MCV 88.3 11/13/2023   PLT 374 11/13/2023   NEUTROABS PENDING 11/13/2023    Imaging:  No results found.  Medications: I have reviewed the patient's current medications.  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 Paracentesis 07/17/2023-acute inflammation, no malignant cells CT biopsy of right upper quadrant omental thickening 07/19/2023: Metastatic total differentiated carcinoma consistent with  primary gastric carcinoma, normal mismatch repair protein expression , HER2 (0); foundation 1-microsatellite stable, tumor mutation burden 2, PD-L1 CPS 1; [place] PD-L1 CPS 60% POSITIVE expression of CLND18: 100% Cycle 1 FOLFOX/nivolumab 08/16/2023 Cycle 2 FOLFOX/nivolumab 08/30/2023 Cycle 3 FOLFOX/nivolumab 09/13/2023 CT abdomen/pelvis 09/28/2023: Large volume ascites, difficult to assess omentum, decreased conspicuity of hepatic hypodensities, nonspecific thickening at the pyloric region of the stomach Cycle 4 FOLFOX/nivolumab 10/04/2023, GCSF added Cycle 5 FOLFOX/nivolumab 10/18/2023, GCSF Cycle 6 FOLFOX/nivolumab 11/13/2023, G-CSF   Pain secondary to #1 History of recurrent venous thrombosis-maintained on apixaban anticoagulation Tobacco and alcohol use Family history of multiple cancers Anorexia/weight loss and constipation secondary to #1 - on multiple medications, not a candidate for abd feeding tube due to malignant ascites. Followed by Nutritionist  Left hip replacement June 2024 Hypokalemia, secondary to poor nutrition and n/v. Currently takes oral KCL BID Admission 09/27/23 - 09/29/23 for intractable N/V, negative for gastric outlet or small bowel obstruction. CT showed no disease progression. Sx improved after paracentesis (5 L) Admission 10/23/23 - 11/01/2023 for intractable N/V, CT: ascites, possible ileus, and concern for peritoneal carcinomatosis. S/p paracentesis. EGD postponed to 3/25 due to febrile neutropenia, treated with abx, cultures negative. Endoscopy [name]): esophagitis and small hernia, no GOO or any role for stenting. Path negative for viral or fungal etiology. He was placed on liquid diet with goal to advance to mechanical soft diet.   Disposition: [name] appears stable.  He has completed 5 cycles of FOLFOX/nivolumab.  He has had significant improvement in his performance status since he was last seen 11/08/2023.  Plan to proceed with cycle 6 FOLFOX/nivolumab today as  scheduled.  Consider adding  zolbetuximab with the next treatment in 2 weeks.  CBC and chemistry panel reviewed.  Labs adequate to proceed as above.  He has mild hypokalemia.  We are checking a magnesium level.  He will increase oral potassium from 10 mEq twice daily to 3 times daily.  Pain is well-controlled with Dilaudid 2-3 times a day.  He will not begin oxycodone at this point.  He is no longer experiencing nausea/vomiting.  He will decrease dexamethasone to 4 mg daily, continue metoclopramide 4 times daily.  He will complete a 4-day course of Diflucan for oral candidiasis.  He will return for follow-up and treatment in 2 weeks.  We are available to see him sooner if needed.  Patient seen with Dr. Truett Perna.  Lonna Cobb ANP/GNP-BC   11/13/2023  11:04 AM  This was a shared visit with Lonna Cobb.  [name] was interviewed and examined.  His performance status is significantly improved compared to when we saw him last week.  He feels Decadron and Reglan have helped.  He will complete another cycle of FOLFOX/nivolumab today.  The tumor returned with overexpression of CLDN 18.  Zolbetuximab will be added to the next cycle of chemotherapy.  We reviewed potential toxicities associated with zolbetuximab including the chance of an allergic reaction and nausea.  I was present for greater than 50% of today's visit.  I performed medical decision making.  Mancel Bale, MD

## 2023-11-14 ENCOUNTER — Other Ambulatory Visit: Payer: Self-pay | Admitting: Oncology

## 2023-11-14 DIAGNOSIS — C169 Malignant neoplasm of stomach, unspecified: Secondary | ICD-10-CM | POA: Diagnosis not present

## 2023-11-15 ENCOUNTER — Ambulatory Visit: Admitting: Nurse Practitioner

## 2023-11-15 ENCOUNTER — Inpatient Hospital Stay: Admitting: Nutrition

## 2023-11-15 ENCOUNTER — Other Ambulatory Visit

## 2023-11-15 ENCOUNTER — Other Ambulatory Visit: Payer: Self-pay | Admitting: Oncology

## 2023-11-15 ENCOUNTER — Ambulatory Visit

## 2023-11-15 ENCOUNTER — Inpatient Hospital Stay

## 2023-11-15 VITALS — BP 99/85 | HR 84 | Resp 18

## 2023-11-15 DIAGNOSIS — C163 Malignant neoplasm of pyloric antrum: Secondary | ICD-10-CM

## 2023-11-15 DIAGNOSIS — Z5111 Encounter for antineoplastic chemotherapy: Secondary | ICD-10-CM | POA: Diagnosis not present

## 2023-11-15 MED ORDER — HEPARIN SOD (PORK) LOCK FLUSH 100 UNIT/ML IV SOLN
500.0000 [IU] | Freq: Once | INTRAVENOUS | Status: AC | PRN
  Administered 2023-11-15: 500 [IU]

## 2023-11-15 MED ORDER — PEGFILGRASTIM-JMDB 6 MG/0.6ML ~~LOC~~ SOSY
6.0000 mg | PREFILLED_SYRINGE | Freq: Once | SUBCUTANEOUS | Status: AC
Start: 2023-11-15 — End: 2023-11-15
  Administered 2023-11-15: 6 mg via SUBCUTANEOUS
  Filled 2023-11-15: qty 0.6

## 2023-11-15 MED ORDER — SODIUM CHLORIDE 0.9% FLUSH
10.0000 mL | INTRAVENOUS | Status: DC | PRN
  Administered 2023-11-15: 10 mL

## 2023-11-15 NOTE — Progress Notes (Signed)
 DISCONTINUE ON PATHWAY REGIMEN - Gastroesophageal     A cycle is every 14 days:     Oxaliplatin      Leucovorin      Fluorouracil      Fluorouracil   **Always confirm dose/schedule in your pharmacy ordering system**  PRIOR TREATMENT: GEOS3: mFOLFOX6 q14 Days Until Progression or Unacceptable Toxicity  START ON PATHWAY REGIMEN - Gastroesophageal     A cycle is every 14 days:     Nivolumab      Oxaliplatin      Leucovorin      Fluorouracil      Fluorouracil   **Always confirm dose/schedule in your pharmacy ordering system**  Patient Characteristics: Distant Metastases (cM1/pM1) / Locally Recurrent Disease, Adenocarcinoma - Esophageal, GE Junction, and Gastric, First Line, HER2 Negative/Unknown, PD?L1 Expression  PositiveCPS ? 5 Therapeutic Status: Distant Metastases (No Additional Staging) Histology: Adenocarcinoma Disease Classification: Gastric Line of Therapy: First Line HER2 Status: Negative PD-L1 Expression Status: PD-L1 Expression Positive CPS ? 5 Intent of Therapy: Non-Curative / Palliative Intent, Discussed with Patient

## 2023-11-15 NOTE — Progress Notes (Signed)
 Telephone follow up completed with patient and wife. Patient receiving FOLFOX/nivolumab for Gastric cancer. He is followed by Dr. Truett Perna. Considering adding zolbetuximab with the next treatment in 2 weeks.   Admission 10/23/23 - 11/01/2023 for intractable N/V, CT: ascites, possible ileus, and concern for peritoneal carcinomatosis. S/p paracentesis.   Weight documented as 125 pounds 1.6 oz April 7. Improved from 122 pounds April 2.  Labs include magnesium 1.4, Sodium 133, Potassium 3.2, Glucose 108, Creatinine 0.52, and Albumin 3.0.  He is no longer experiencing nausea/vomiting. He has decreased dexamethasone to 4 mg daily, continue metoclopramide 4 times daily. He will complete a 4-day course of Diflucan for oral candidiasis.   Patient reports he has not vomited since last week. He feels so much better and his energy is slowly improving. States he is not retaining any fluid "in his stomach" now. Pain is controlled. Reports he is tolerating oatmeal, blueberries, strawberries, and applesauce. He is drinking Ensure Complete now. Also puts spinach in homemade smoothies. Took a laxative last night with good results.  Nutrition Diagnosis: Unintended weight loss slowly improving.  Intervention: Continue small, frequent meals and snacks with high calorie, high protein foods as tolerated. Continue ONS between meals. Continue bowel regimen. Medications per MD.  Monitoring, Evaluation, Goals: Increase calories and protein to support weight gain.  Next Visit: Wednesday April 23, after pump stop. Patient aware of appointment.

## 2023-11-16 ENCOUNTER — Encounter: Payer: Self-pay | Admitting: Oncology

## 2023-11-16 ENCOUNTER — Other Ambulatory Visit: Payer: Self-pay | Admitting: Pharmacist

## 2023-11-17 ENCOUNTER — Encounter

## 2023-11-22 ENCOUNTER — Other Ambulatory Visit

## 2023-11-22 ENCOUNTER — Ambulatory Visit

## 2023-11-22 ENCOUNTER — Ambulatory Visit: Admitting: Oncology

## 2023-11-23 ENCOUNTER — Encounter: Payer: Self-pay | Admitting: Oncology

## 2023-11-23 ENCOUNTER — Other Ambulatory Visit: Payer: Self-pay | Admitting: Oncology

## 2023-11-23 ENCOUNTER — Telehealth: Payer: Self-pay | Admitting: *Deleted

## 2023-11-23 ENCOUNTER — Other Ambulatory Visit: Payer: Self-pay | Admitting: *Deleted

## 2023-11-23 DIAGNOSIS — C163 Malignant neoplasm of pyloric antrum: Secondary | ICD-10-CM

## 2023-11-23 MED ORDER — SORBITOL 70 % SOLN: 15.0000 mL | Freq: Every day | 1 refills | Status: DC | PRN

## 2023-11-23 MED ORDER — OLANZAPINE 5 MG PO TABS: 5.0000 mg | ORAL_TABLET | Freq: Every day | ORAL | 1 refills | Status: DC

## 2023-11-23 MED ORDER — HYDROMORPHONE HCL 4 MG PO TABS: 4.0000 mg | ORAL_TABLET | ORAL | 0 refills | Status: DC | PRN

## 2023-11-23 MED ORDER — DEXAMETHASONE 4 MG PO TABS: 8.0000 mg | ORAL_TABLET | Freq: Every day | ORAL | 1 refills | Status: DC

## 2023-11-23 NOTE — Telephone Encounter (Signed)
 Mrs. Tiu called that he needs refill on hydromorphone and Sorbitol to [facility]. MD notified.

## 2023-11-24 ENCOUNTER — Encounter

## 2023-11-24 ENCOUNTER — Other Ambulatory Visit: Payer: Self-pay | Admitting: Oncology

## 2023-11-24 ENCOUNTER — Encounter: Payer: Self-pay | Admitting: Oncology

## 2023-11-24 DIAGNOSIS — C163 Malignant neoplasm of pyloric antrum: Secondary | ICD-10-CM

## 2023-11-27 ENCOUNTER — Encounter: Payer: Self-pay | Admitting: Oncology

## 2023-11-27 ENCOUNTER — Inpatient Hospital Stay

## 2023-11-27 ENCOUNTER — Inpatient Hospital Stay (HOSPITAL_BASED_OUTPATIENT_CLINIC_OR_DEPARTMENT_OTHER): Admitting: Oncology

## 2023-11-27 VITALS — BP 127/89 | HR 88 | Temp 98.4°F | Resp 16

## 2023-11-27 VITALS — BP 87/75 | HR 87 | Temp 98.1°F | Resp 18 | Ht 73.0 in | Wt 121.0 lb

## 2023-11-27 DIAGNOSIS — Z5111 Encounter for antineoplastic chemotherapy: Secondary | ICD-10-CM | POA: Diagnosis not present

## 2023-11-27 DIAGNOSIS — C163 Malignant neoplasm of pyloric antrum: Secondary | ICD-10-CM

## 2023-11-27 LAB — CBC WITH DIFFERENTIAL (CANCER CENTER ONLY)
Abs Immature Granulocytes: 0.37 10*3/uL — ABNORMAL HIGH (ref 0.00–0.07)
Basophils Absolute: 0.1 10*3/uL (ref 0.0–0.1)
Basophils Relative: 0 %
Eosinophils Absolute: 0 10*3/uL (ref 0.0–0.5)
Eosinophils Relative: 0 %
HCT: 31.3 % — ABNORMAL LOW (ref 39.0–52.0)
Hemoglobin: 10.3 g/dL — ABNORMAL LOW (ref 13.0–17.0)
Immature Granulocytes: 1 %
Lymphocytes Relative: 2 %
Lymphs Abs: 0.7 10*3/uL (ref 0.7–4.0)
MCH: 29.9 pg (ref 26.0–34.0)
MCHC: 32.9 g/dL (ref 30.0–36.0)
MCV: 90.7 fL (ref 80.0–100.0)
Monocytes Absolute: 1 10*3/uL (ref 0.1–1.0)
Monocytes Relative: 3 %
Neutro Abs: 29.8 10*3/uL — ABNORMAL HIGH (ref 1.7–7.7)
Neutrophils Relative %: 94 %
Platelet Count: 171 10*3/uL (ref 150–400)
RBC: 3.45 MIL/uL — ABNORMAL LOW (ref 4.22–5.81)
RDW: 22.7 % — ABNORMAL HIGH (ref 11.5–15.5)
WBC Count: 31.9 10*3/uL — ABNORMAL HIGH (ref 4.0–10.5)
nRBC: 0 % (ref 0.0–0.2)

## 2023-11-27 LAB — CMP (CANCER CENTER ONLY)
ALT: 26 U/L (ref 0–44)
AST: 14 U/L — ABNORMAL LOW (ref 15–41)
Albumin: 3.3 g/dL — ABNORMAL LOW (ref 3.5–5.0)
Alkaline Phosphatase: 109 U/L (ref 38–126)
Anion gap: 10 (ref 5–15)
BUN: 15 mg/dL (ref 6–20)
CO2: 23 mmol/L (ref 22–32)
Calcium: 8.8 mg/dL — ABNORMAL LOW (ref 8.9–10.3)
Chloride: 99 mmol/L (ref 98–111)
Creatinine: 0.5 mg/dL — ABNORMAL LOW (ref 0.61–1.24)
GFR, Estimated: >60 mL/min (ref >60)
Glucose, Bld: 146 mg/dL — ABNORMAL HIGH (ref 70–99)
Potassium: 3.5 mmol/L (ref 3.5–5.1)
Sodium: 132 mmol/L — ABNORMAL LOW (ref 135–145)
Total Bilirubin: 0.4 mg/dL (ref 0.0–1.2)
Total Protein: 5.4 g/dL — ABNORMAL LOW (ref 6.5–8.1)

## 2023-11-27 MED ORDER — DIPHENHYDRAMINE HCL 50 MG/ML IJ SOLN
25.0000 mg | Freq: Once | INTRAMUSCULAR | Status: AC
  Administered 2023-11-27: 25 mg via INTRAVENOUS
  Filled 2023-11-27: qty 1

## 2023-11-27 MED ORDER — OLANZAPINE 5 MG PO TABS
5.0000 mg | ORAL_TABLET | Freq: Once | ORAL | Status: AC
  Administered 2023-11-27: 5 mg via ORAL
  Filled 2023-11-27: qty 1

## 2023-11-27 MED ORDER — PROCHLORPERAZINE MALEATE 10 MG PO TABS
5.0000 mg | ORAL_TABLET | ORAL | Status: DC | PRN
  Administered 2023-11-27 (×2): 5 mg via ORAL
  Filled 2023-11-27 (×2): qty 1

## 2023-11-27 MED ORDER — HEPARIN SOD (PORK) LOCK FLUSH 100 UNIT/ML IV SOLN
500.0000 [IU] | Freq: Once | INTRAVENOUS | Status: AC | PRN
Start: 2023-11-27 — End: 2023-11-27
  Administered 2023-11-27: 500 [IU]

## 2023-11-27 MED ORDER — ZOLBETUXIMAB-CLZB CHEMO 100MG/5ML IV SOLN
408.0000 mg/m2 | Freq: Once | INTRAVENOUS | Status: AC
  Administered 2023-11-27: 700 mg via INTRAVENOUS
  Filled 2023-11-27: qty 35

## 2023-11-27 MED ORDER — FAMOTIDINE IN NACL 20-0.9 MG/50ML-% IV SOLN
20.0000 mg | Freq: Once | INTRAVENOUS | Status: AC
  Administered 2023-11-27: 20 mg via INTRAVENOUS
  Filled 2023-11-27: qty 50

## 2023-11-27 MED ORDER — SODIUM CHLORIDE 0.9% FLUSH
10.0000 mL | INTRAVENOUS | Status: DC | PRN
  Administered 2023-11-27: 10 mL

## 2023-11-27 MED ORDER — APREPITANT 130 MG/18ML IV EMUL
130.0000 mg | Freq: Once | INTRAVENOUS | Status: AC
  Administered 2023-11-27: 130 mg via INTRAVENOUS
  Filled 2023-11-27: qty 18

## 2023-11-27 MED ORDER — DEXAMETHASONE SODIUM PHOSPHATE 10 MG/ML IJ SOLN
10.0000 mg | Freq: Once | INTRAMUSCULAR | Status: AC
  Administered 2023-11-27: 10 mg via INTRAVENOUS
  Filled 2023-11-27: qty 1

## 2023-11-27 MED ORDER — SODIUM CHLORIDE 0.9 % IV SOLN: INTRAVENOUS | Status: DC

## 2023-11-27 MED ORDER — PALONOSETRON HCL INJECTION 0.25 MG/5ML
0.2500 mg | Freq: Once | INTRAVENOUS | Status: AC
  Administered 2023-11-27: 0.25 mg via INTRAVENOUS
  Filled 2023-11-27: qty 5

## 2023-11-27 NOTE — Progress Notes (Signed)
 Patient seen by Dr. Coni Deep today  Vitals are within treatment parameters:Yes OK to proceed w/BP 87/75   Labs are within treatment parameters: Yes   Treatment plan has been signed: Yes MD will not be able to re-enter careplan for printed consent.  Per physician team, Patient is ready for treatment and there are NO modifications to the treatment plan.  He will take the home dexamethasone  at 4 mg bid x 2 days after chemo treatment--starting on day 3.

## 2023-11-27 NOTE — Progress Notes (Signed)
 Patient was able to tolerate max rate of 67 mL/hr (200 mg/m2). Patient monitored for 2 hours post zolbetuximab infusion. Vital signs remained stable during and post infusion. Patient denied symptoms of nausea, abdominal pain, itching/skin rash, or feeling off balance at the end of treatment/observation period. Patient assisted out of clinic via wheelchair to wife's personal vehicle without incident.

## 2023-11-27 NOTE — Progress Notes (Signed)
  [facility] OFFICE PROGRESS NOTE   Diagnosis: Gastric cancer  INTERVAL HISTORY:   [name] returns as scheduled.  He completed another cycle of FOLFOX/nivolumab  on 11/13/2023.  No nausea/vomiting or mouth sores.  He had cold sensitivity following chemotherapy.  No peripheral numbness at present.  He is eating a regular diet.  No emesis.  He continues to have abdominal pain.  He takes Dilaudid  as needed.  Objective:  Vital signs in last 24 hours:  Blood pressure (!) 87/75, pulse 87, temperature 98.1 F (36.7 C), temperature source Temporal, resp. rate 18, height 6\' 1"  (1.854 m), weight 121 lb (54.9 kg), SpO2 100%.    HEENT: No thrush or ulcers Resp: Lungs clear bilaterally Cardio: Regular rate and rhythm GI: No mass, no apparent ascites, no hepatosplenomegaly Vascular: No leg edema Neuro: Mild to moderate loss of vibratory sense at the fingertips bilaterally Skin: Mild hyperpigmentation of the hands  Portacath/PICC-without erythema  Lab Results:  Lab Results  Component Value Date   WBC 31.9 (H) 11/27/2023   HGB 10.3 (L) 11/27/2023   HCT 31.3 (L) 11/27/2023   MCV 90.7 11/27/2023   PLT 171 11/27/2023   NEUTROABS 29.8 (H) 11/27/2023    CMP  Lab Results  Component Value Date   NA 132 (L) 11/27/2023   K 3.5 11/27/2023   CL 99 11/27/2023   CO2 23 11/27/2023   GLUCOSE 146 (H) 11/27/2023   BUN 15 11/27/2023   CREATININE 0.50 (L) 11/27/2023   CALCIUM  8.8 (L) 11/27/2023   PROT 5.4 (L) 11/27/2023   ALBUMIN  3.3 (L) 11/27/2023   AST 14 (L) 11/27/2023   ALT 26 11/27/2023   ALKPHOS 109 11/27/2023   BILITOT 0.4 11/27/2023   GFRNONAA >60 11/27/2023   GFRAA >60 07/11/2019    Lab Results  Component Value Date   CEA 1.31 08/11/2023      Medications: I have reviewed the patient's current medications.   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 Paracentesis 07/17/2023-acute inflammation, no malignant cells CT biopsy of right upper quadrant omental thickening 07/19/2023: Metastatic total differentiated carcinoma consistent with primary gastric carcinoma, normal mismatch repair protein expression , HER2 (0); foundation 1-microsatellite stable, tumor mutation burden 2, PD-L1 CPS 1; [place] PD-L1 CPS 60% POSITIVE expression of CLND18: 100% Cycle 1 FOLFOX/nivolumab  08/16/2023 Cycle 2 FOLFOX/nivolumab  08/30/2023 Cycle 3 FOLFOX/nivolumab  09/13/2023 CT abdomen/pelvis 09/28/2023: Large volume ascites, difficult to assess omentum, decreased conspicuity of hepatic hypodensities, nonspecific thickening at the pyloric region of the stomach Cycle 4 FOLFOX/nivolumab  10/04/2023, GCSF added Cycle 5 FOLFOX/nivolumab  10/18/2023, GCSF Cycle 6 FOLFOX/nivolumab  11/13/2023, G-CSF Cycle 7 FOLFOX/nivolumab  11/28/2023, G-CSF, day 1 zolbetuximab 11/27/2023   Pain secondary to #1 History of recurrent venous thrombosis-maintained on apixaban  anticoagulation Tobacco and alcohol use Family history of multiple cancers Anorexia/weight loss and constipation secondary to #1 - on multiple medications, not a candidate for abd feeding tube due to malignant ascites. Followed by Nutritionist  Left hip replacement June 2024 Hypokalemia, secondary to poor nutrition and n/v. Currently takes oral KCL BID Admission 09/27/23 - 09/29/23 for intractable N/V, negative for gastric outlet or small bowel obstruction. CT showed no disease progression. Sx improved after paracentesis (5  L) Admission 10/23/23 - 11/01/2023 for intractable N/V, CT: ascites,  possible ileus, and concern for peritoneal carcinomatosis. S/p paracentesis. EGD postponed to 3/25 due to febrile neutropenia, treated with abx, cultures negative. Endoscopy [name]): esophagitis and small hernia, no GOO or any role for stenting. Path negative for viral or fungal etiology. He was   Disposition: [name] has metastatic gastric cancer.  He has been treated with FOLFOX/nivolumab  since January.  His performance status has improved significantly over the past few weeks.  The plan is to continue FOLFOX/nivolumab .  Zolbetuximab will be added to the systemic therapy regimen beginning today.  We reviewed potential toxicities associated with zolbetuximab including the chance of nausea and an allergic reaction.  He agrees to proceed.  Zolbetuximab and FOLFOX/nivolumab  will be given on a split schedule with this cycle.  He will receive FOLFOX/zolbetuximab on day 1 with subsequent cycles.  He will limit the Decadron  dose to 4 mg twice daily on day 3 and day 4 with this cycle.  He will be referred for restaging CTs in May or June.  Coni Deep, MD  11/27/2023  8:58 AM

## 2023-11-28 ENCOUNTER — Inpatient Hospital Stay

## 2023-11-28 VITALS — BP 115/90 | HR 92 | Temp 98.0°F | Resp 16

## 2023-11-28 DIAGNOSIS — C163 Malignant neoplasm of pyloric antrum: Secondary | ICD-10-CM

## 2023-11-28 DIAGNOSIS — C169 Malignant neoplasm of stomach, unspecified: Secondary | ICD-10-CM | POA: Diagnosis not present

## 2023-11-28 DIAGNOSIS — Z5111 Encounter for antineoplastic chemotherapy: Secondary | ICD-10-CM | POA: Diagnosis not present

## 2023-11-28 DIAGNOSIS — Z95828 Presence of other vascular implants and grafts: Secondary | ICD-10-CM

## 2023-11-28 MED ORDER — SODIUM CHLORIDE 0.9 % IV SOLN: INTRAVENOUS | Status: DC

## 2023-11-28 MED ORDER — LEUCOVORIN CALCIUM INJECTION 350 MG
400.0000 mg/m2 | Freq: Once | INTRAVENOUS | Status: AC
  Administered 2023-11-28: 672 mg via INTRAVENOUS
  Filled 2023-11-28: qty 25

## 2023-11-28 MED ORDER — SODIUM CHLORIDE 0.9 % IV SOLN
240.0000 mg | Freq: Once | INTRAVENOUS | Status: AC
  Administered 2023-11-28: 240 mg via INTRAVENOUS
  Filled 2023-11-28: qty 24

## 2023-11-28 MED ORDER — SODIUM CHLORIDE 0.9% FLUSH
10.0000 mL | Freq: Once | INTRAVENOUS | Status: AC
Start: 2023-11-28 — End: 2023-11-28
  Administered 2023-11-28: 10 mL via INTRAVENOUS

## 2023-11-28 MED ORDER — SODIUM CHLORIDE 0.9 % IV SOLN
2400.0000 mg/m2 | INTRAVENOUS | Status: DC
  Administered 2023-11-28: 4050 mg via INTRAVENOUS
  Filled 2023-11-28: qty 81

## 2023-11-28 MED ORDER — DEXAMETHASONE SODIUM PHOSPHATE 10 MG/ML IJ SOLN
10.0000 mg | Freq: Once | INTRAMUSCULAR | Status: AC
  Administered 2023-11-28: 10 mg via INTRAVENOUS
  Filled 2023-11-28: qty 1

## 2023-11-28 MED ORDER — DEXTROSE 5 % IV SOLN: INTRAVENOUS | Status: DC

## 2023-11-28 MED ORDER — DEXTROSE 5 % IV SOLN
65.0000 mg/m2 | Freq: Once | INTRAVENOUS | Status: AC
  Administered 2023-11-28: 100 mg via INTRAVENOUS
  Filled 2023-11-28: qty 20

## 2023-11-28 NOTE — Progress Notes (Signed)
 Patient presents for day 2 cycle 1 of treatment regimen. Patient and wife report patient did well through the night after receiving first cycle of zolbetuximab yesterday. Patient denies any nausea, abdominal discomfort, or skin rashes. Wife did mention that patient sustained a fall this morning, but states that he did not hit his head, and denied any serious injury. Vital signs stable and patient agreeable to proceeding with nivolumab /FOLFOX   Dr. Scherrie Curt and team made aware of above information

## 2023-11-29 ENCOUNTER — Inpatient Hospital Stay: Admitting: Nutrition

## 2023-11-29 ENCOUNTER — Encounter

## 2023-11-29 NOTE — Progress Notes (Signed)
 Telephone follow up completed with patient's wife. Patient s/p FOLFOX/Nivolumab . Followed by Dr. Scherrie Curt.  Weight: 121 pounds April 21. 127 pounds 1.6 oz March 19 145 pounds Feb 26.  Labs include Na 132, Glucose 146, Creatinine 0.5 and Albumin  3.3.  Patient has been very tired and weak. He has been sleeping a lot since his treatment on Monday. He had nausea but no vomiting. Has liquids at bedside. No other concerns mentioned.  Nutrition Diagnosis: Unintended wt loss, continues.  Intervention:  Continue to offer fluids to minimize dehydration. When able, advance diet as tolerated.  Check in with nursing staff with concerns.  Monitoring, Evaluation, Goals: Tolerate increased calories and protein to minimize weight loss.  Next Visit: To be scheduled as needed with upcoming treatment.

## 2023-11-30 ENCOUNTER — Inpatient Hospital Stay: Payer: Self-pay

## 2023-11-30 ENCOUNTER — Other Ambulatory Visit: Payer: Self-pay | Admitting: *Deleted

## 2023-11-30 VITALS — BP 92/68 | HR 81 | Temp 98.0°F | Resp 16

## 2023-11-30 DIAGNOSIS — C163 Malignant neoplasm of pyloric antrum: Secondary | ICD-10-CM

## 2023-11-30 DIAGNOSIS — Z5111 Encounter for antineoplastic chemotherapy: Secondary | ICD-10-CM | POA: Diagnosis not present

## 2023-11-30 MED ORDER — SODIUM CHLORIDE 0.9% FLUSH
10.0000 mL | INTRAVENOUS | Status: DC | PRN
  Administered 2023-11-30: 10 mL

## 2023-11-30 MED ORDER — PEGFILGRASTIM-JMDB 6 MG/0.6ML ~~LOC~~ SOSY
6.0000 mg | PREFILLED_SYRINGE | Freq: Once | SUBCUTANEOUS | Status: AC
  Administered 2023-11-30: 6 mg via SUBCUTANEOUS

## 2023-11-30 MED ORDER — SODIUM CHLORIDE 0.9 % IV SOLN
INTRAVENOUS | Status: AC
Start: 2023-11-30 — End: 2023-11-30

## 2023-11-30 MED ORDER — HEPARIN SOD (PORK) LOCK FLUSH 100 UNIT/ML IV SOLN
500.0000 [IU] | Freq: Once | INTRAVENOUS | Status: AC | PRN
  Administered 2023-11-30: 500 [IU]

## 2023-11-30 NOTE — Progress Notes (Signed)
 Mrs. Pascal called asking for Proctor to have 1 liter NS today with pump d/c. He is weak and not getting enough fluids in and the IVF always make him feel better.

## 2023-12-04 ENCOUNTER — Telehealth: Payer: Self-pay

## 2023-12-04 ENCOUNTER — Other Ambulatory Visit: Payer: Self-pay

## 2023-12-04 ENCOUNTER — Other Ambulatory Visit: Payer: Self-pay | Admitting: Nurse Practitioner

## 2023-12-04 DIAGNOSIS — C163 Malignant neoplasm of pyloric antrum: Secondary | ICD-10-CM

## 2023-12-04 DIAGNOSIS — C800 Disseminated malignant neoplasm, unspecified: Secondary | ICD-10-CM

## 2023-12-04 MED ORDER — DRONABINOL 2.5 MG PO CAPS: 2.5000 mg | ORAL_CAPSULE | Freq: Two times a day (BID) | ORAL | 0 refills | Status: DC

## 2023-12-04 MED ORDER — HYDROMORPHONE HCL 4 MG PO TABS: 4.0000 mg | ORAL_TABLET | ORAL | 0 refills | Status: DC | PRN

## 2023-12-04 MED ORDER — DRONABINOL 2.5 MG PO CAPS
2.5000 mg | ORAL_CAPSULE | Freq: Two times a day (BID) | ORAL | 0 refills | Status: DC
Start: 2023-12-04 — End: 2024-01-09

## 2023-12-04 NOTE — Telephone Encounter (Signed)
 Patient called in and requested a refill of his Dilaudid  and [name] . I placed the request on np's desk.

## 2023-12-05 ENCOUNTER — Inpatient Hospital Stay

## 2023-12-05 ENCOUNTER — Other Ambulatory Visit

## 2023-12-05 ENCOUNTER — Other Ambulatory Visit: Payer: Self-pay | Admitting: *Deleted

## 2023-12-05 ENCOUNTER — Other Ambulatory Visit: Payer: Self-pay

## 2023-12-05 ENCOUNTER — Inpatient Hospital Stay: Admitting: Genetic Counselor

## 2023-12-05 ENCOUNTER — Telehealth: Payer: Self-pay | Admitting: *Deleted

## 2023-12-05 VITALS — BP 102/77 | HR 99 | Temp 98.6°F | Resp 16

## 2023-12-05 DIAGNOSIS — R112 Nausea with vomiting, unspecified: Secondary | ICD-10-CM

## 2023-12-05 DIAGNOSIS — C163 Malignant neoplasm of pyloric antrum: Secondary | ICD-10-CM

## 2023-12-05 DIAGNOSIS — Z5111 Encounter for antineoplastic chemotherapy: Secondary | ICD-10-CM | POA: Diagnosis not present

## 2023-12-05 DIAGNOSIS — Z95828 Presence of other vascular implants and grafts: Secondary | ICD-10-CM

## 2023-12-05 DIAGNOSIS — C800 Disseminated malignant neoplasm, unspecified: Secondary | ICD-10-CM

## 2023-12-05 LAB — CBC WITH DIFFERENTIAL (CANCER CENTER ONLY)
Abs Immature Granulocytes: 1.92 10*3/uL — ABNORMAL HIGH (ref 0.00–0.07)
Basophils Absolute: 0.1 10*3/uL (ref 0.0–0.1)
Basophils Relative: 1 %
Eosinophils Absolute: 0.1 10*3/uL (ref 0.0–0.5)
Eosinophils Relative: 0 %
HCT: 33.4 % — ABNORMAL LOW (ref 39.0–52.0)
Hemoglobin: 11 g/dL — ABNORMAL LOW (ref 13.0–17.0)
Immature Granulocytes: 6 %
Lymphocytes Relative: 4 %
Lymphs Abs: 1.1 10*3/uL (ref 0.7–4.0)
MCH: 30 pg (ref 26.0–34.0)
MCHC: 32.9 g/dL (ref 30.0–36.0)
MCV: 91 fL (ref 80.0–100.0)
Monocytes Absolute: 1.5 10*3/uL — ABNORMAL HIGH (ref 0.1–1.0)
Monocytes Relative: 5 %
Neutro Abs: 25.7 10*3/uL — ABNORMAL HIGH (ref 1.7–7.7)
Neutrophils Relative %: 84 %
Platelet Count: 111 10*3/uL — ABNORMAL LOW (ref 150–400)
RBC: 3.67 MIL/uL — ABNORMAL LOW (ref 4.22–5.81)
RDW: 22.2 % — ABNORMAL HIGH (ref 11.5–15.5)
WBC Count: 30.4 10*3/uL — ABNORMAL HIGH (ref 4.0–10.5)
nRBC: 0 % (ref 0.0–0.2)

## 2023-12-05 LAB — CMP (CANCER CENTER ONLY)
ALT: 23 U/L (ref 0–44)
AST: 22 U/L (ref 15–41)
Albumin: 3.1 g/dL — ABNORMAL LOW (ref 3.5–5.0)
Alkaline Phosphatase: 189 U/L — ABNORMAL HIGH (ref 38–126)
Anion gap: 14 (ref 5–15)
BUN: 14 mg/dL (ref 6–20)
CO2: 22 mmol/L (ref 22–32)
Calcium: 8.9 mg/dL (ref 8.9–10.3)
Chloride: 97 mmol/L — ABNORMAL LOW (ref 98–111)
Creatinine: 0.48 mg/dL — ABNORMAL LOW (ref 0.61–1.24)
GFR, Estimated: >60 mL/min (ref >60)
Glucose, Bld: 90 mg/dL (ref 70–99)
Potassium: 2.8 mmol/L — ABNORMAL LOW (ref 3.5–5.1)
Sodium: 133 mmol/L — ABNORMAL LOW (ref 135–145)
Total Bilirubin: 0.4 mg/dL (ref 0.0–1.2)
Total Protein: 4.5 g/dL — ABNORMAL LOW (ref 6.5–8.1)

## 2023-12-05 LAB — MAGNESIUM: Magnesium: 1.5 mg/dL — ABNORMAL LOW (ref 1.7–2.4)

## 2023-12-05 MED ORDER — SODIUM CHLORIDE 0.9% FLUSH
10.0000 mL | Freq: Once | INTRAVENOUS | Status: AC
  Administered 2023-12-05: 10 mL via INTRAVENOUS

## 2023-12-05 MED ORDER — SODIUM CHLORIDE 0.9 % IV SOLN
INTRAVENOUS | Status: AC
Start: 2023-12-05 — End: 2023-12-05

## 2023-12-05 MED ORDER — LORAZEPAM 2 MG/ML IJ SOLN
0.5000 mg | Freq: Once | INTRAMUSCULAR | Status: AC
  Administered 2023-12-05: 0.5 mg via INTRAVENOUS
  Filled 2023-12-05: qty 1

## 2023-12-05 MED ORDER — HEPARIN SOD (PORK) LOCK FLUSH 100 UNIT/ML IV SOLN
500.0000 [IU] | Freq: Once | INTRAVENOUS | Status: AC
  Administered 2023-12-05: 500 [IU] via INTRAVENOUS

## 2023-12-05 NOTE — Telephone Encounter (Signed)
 Called to report persistent nausea with vomiting x 4 in past 24 hours despite antiemetic Q6 hours. Has been trying to consume the Ensure and applesauce, but he vomits. Suggested he just stick with clear liquids for now. Was constipated as well, but after laxative had good BM today. He also reports feeling very weak and has a headache. Asking if he should go to hospital or come here? Encouraged him to wait so we can see if we can bring him in here instead of an ER visit. He will come today at 1130/1115.

## 2023-12-06 ENCOUNTER — Inpatient Hospital Stay

## 2023-12-06 ENCOUNTER — Telehealth: Payer: Self-pay

## 2023-12-06 ENCOUNTER — Other Ambulatory Visit: Payer: Self-pay | Admitting: *Deleted

## 2023-12-06 ENCOUNTER — Other Ambulatory Visit: Payer: Self-pay

## 2023-12-06 VITALS — BP 117/92 | HR 100 | Temp 98.8°F | Resp 16

## 2023-12-06 DIAGNOSIS — C800 Disseminated malignant neoplasm, unspecified: Secondary | ICD-10-CM

## 2023-12-06 DIAGNOSIS — Z5111 Encounter for antineoplastic chemotherapy: Secondary | ICD-10-CM | POA: Diagnosis not present

## 2023-12-06 DIAGNOSIS — Z95828 Presence of other vascular implants and grafts: Secondary | ICD-10-CM

## 2023-12-06 MED ORDER — SODIUM CHLORIDE 0.9 % IV SOLN: INTRAVENOUS | Status: AC

## 2023-12-06 MED ORDER — MAGNESIUM SULFATE 4 GM/100ML IV SOLN
4.0000 g | Freq: Once | INTRAVENOUS | Status: AC
  Administered 2023-12-06: 4 g via INTRAVENOUS
  Filled 2023-12-06: qty 100

## 2023-12-06 MED ORDER — SODIUM CHLORIDE 0.9% FLUSH
10.0000 mL | Freq: Once | INTRAVENOUS | Status: AC
  Administered 2023-12-06: 10 mL via INTRAVENOUS

## 2023-12-06 MED ORDER — HEPARIN SOD (PORK) LOCK FLUSH 100 UNIT/ML IV SOLN
500.0000 [IU] | Freq: Once | INTRAVENOUS | Status: AC
  Administered 2023-12-06: 500 [IU] via INTRAVENOUS

## 2023-12-06 NOTE — Progress Notes (Signed)
 Per Diana Forster, will give Mg+ 4 grams today with his IVF and will recheck BMP and Mg+ on 5/02.

## 2023-12-06 NOTE — Telephone Encounter (Signed)
 I contacted the patient to inform him that his potassium level was 2.8. The provider has recommended increasing his potassium intake to 20 mEq twice daily for three days. After completing this course, he should resume taking 20 mEq daily. The patient is scheduled to return on Friday for a repeat potassium measurement. The orders have been documented, and the appointment has been scheduled. The patient has been informed of the appointment time.

## 2023-12-07 ENCOUNTER — Telehealth: Payer: Self-pay

## 2023-12-07 NOTE — Telephone Encounter (Signed)
 Spoke with patient's wife via phone regarding a scheduling change. She was informed that the patient's port flush with lab appointment originally scheduled for 11:45 AM tomorrow has been rescheduled to 1:30 PM. An infusion appointment for IV fluids has also been added at 2:00 PM.  She was very agreeable to the changes and expressed that the patient has been experiencing significant nausea. I assured her that this concern would be communicated to the provider for review and possible antiemetic orders.  The patient also requested to receive the infusion in a bed, and this request has been accommodated.

## 2023-12-08 ENCOUNTER — Other Ambulatory Visit: Payer: Self-pay

## 2023-12-08 ENCOUNTER — Other Ambulatory Visit: Payer: Self-pay | Admitting: Nurse Practitioner

## 2023-12-08 ENCOUNTER — Inpatient Hospital Stay

## 2023-12-08 ENCOUNTER — Inpatient Hospital Stay: Attending: Oncology

## 2023-12-08 ENCOUNTER — Telehealth: Payer: Self-pay | Admitting: Oncology

## 2023-12-08 DIAGNOSIS — E860 Dehydration: Secondary | ICD-10-CM | POA: Diagnosis not present

## 2023-12-08 DIAGNOSIS — Z5111 Encounter for antineoplastic chemotherapy: Secondary | ICD-10-CM | POA: Diagnosis present

## 2023-12-08 DIAGNOSIS — C163 Malignant neoplasm of pyloric antrum: Secondary | ICD-10-CM

## 2023-12-08 DIAGNOSIS — E876 Hypokalemia: Secondary | ICD-10-CM | POA: Diagnosis not present

## 2023-12-08 DIAGNOSIS — C800 Disseminated malignant neoplasm, unspecified: Secondary | ICD-10-CM

## 2023-12-08 DIAGNOSIS — R112 Nausea with vomiting, unspecified: Secondary | ICD-10-CM | POA: Insufficient documentation

## 2023-12-08 DIAGNOSIS — D696 Thrombocytopenia, unspecified: Secondary | ICD-10-CM | POA: Insufficient documentation

## 2023-12-08 DIAGNOSIS — G893 Neoplasm related pain (acute) (chronic): Secondary | ICD-10-CM | POA: Diagnosis not present

## 2023-12-08 DIAGNOSIS — Z86718 Personal history of other venous thrombosis and embolism: Secondary | ICD-10-CM | POA: Diagnosis not present

## 2023-12-08 DIAGNOSIS — Z96642 Presence of left artificial hip joint: Secondary | ICD-10-CM | POA: Insufficient documentation

## 2023-12-08 DIAGNOSIS — Z5112 Encounter for antineoplastic immunotherapy: Secondary | ICD-10-CM | POA: Insufficient documentation

## 2023-12-08 DIAGNOSIS — K5900 Constipation, unspecified: Secondary | ICD-10-CM | POA: Insufficient documentation

## 2023-12-08 DIAGNOSIS — Z809 Family history of malignant neoplasm, unspecified: Secondary | ICD-10-CM | POA: Diagnosis not present

## 2023-12-08 DIAGNOSIS — J90 Pleural effusion, not elsewhere classified: Secondary | ICD-10-CM | POA: Diagnosis not present

## 2023-12-08 DIAGNOSIS — C786 Secondary malignant neoplasm of retroperitoneum and peritoneum: Secondary | ICD-10-CM | POA: Insufficient documentation

## 2023-12-08 DIAGNOSIS — R188 Other ascites: Secondary | ICD-10-CM | POA: Diagnosis not present

## 2023-12-08 DIAGNOSIS — Z5189 Encounter for other specified aftercare: Secondary | ICD-10-CM | POA: Insufficient documentation

## 2023-12-08 DIAGNOSIS — Z7901 Long term (current) use of anticoagulants: Secondary | ICD-10-CM | POA: Insufficient documentation

## 2023-12-08 DIAGNOSIS — K209 Esophagitis, unspecified without bleeding: Secondary | ICD-10-CM | POA: Insufficient documentation

## 2023-12-08 DIAGNOSIS — C169 Malignant neoplasm of stomach, unspecified: Secondary | ICD-10-CM | POA: Insufficient documentation

## 2023-12-08 LAB — BASIC METABOLIC PANEL - CANCER CENTER ONLY
Anion gap: 10 (ref 5–15)
BUN: 14 mg/dL (ref 6–20)
CO2: 22 mmol/L (ref 22–32)
Calcium: 8.9 mg/dL (ref 8.9–10.3)
Chloride: 100 mmol/L (ref 98–111)
Creatinine: 0.58 mg/dL — ABNORMAL LOW (ref 0.61–1.24)
GFR, Estimated: >60 mL/min (ref >60)
Glucose, Bld: 80 mg/dL (ref 70–99)
Potassium: 4.4 mmol/L (ref 3.5–5.1)
Sodium: 132 mmol/L — ABNORMAL LOW (ref 135–145)

## 2023-12-08 LAB — MAGNESIUM: Magnesium: 1.8 mg/dL (ref 1.7–2.4)

## 2023-12-08 MED ORDER — SODIUM CHLORIDE 0.9 % IV SOLN
INTRAVENOUS | Status: AC
Start: 2023-12-08 — End: 2023-12-08

## 2023-12-08 MED ORDER — ONDANSETRON HCL 4 MG/2ML IJ SOLN
8.0000 mg | Freq: Once | INTRAMUSCULAR | Status: AC
  Administered 2023-12-08: 8 mg via INTRAVENOUS
  Filled 2023-12-08: qty 4

## 2023-12-08 NOTE — Telephone Encounter (Signed)
 Pt's wife came in today left FMLA  Form it was emailed to [facility].Aaron Aas

## 2023-12-08 NOTE — Patient Instructions (Signed)

## 2023-12-09 ENCOUNTER — Other Ambulatory Visit: Payer: Self-pay | Admitting: Oncology

## 2023-12-12 ENCOUNTER — Inpatient Hospital Stay

## 2023-12-12 ENCOUNTER — Inpatient Hospital Stay (HOSPITAL_BASED_OUTPATIENT_CLINIC_OR_DEPARTMENT_OTHER): Admitting: Nurse Practitioner

## 2023-12-12 ENCOUNTER — Encounter: Payer: Self-pay | Admitting: Nurse Practitioner

## 2023-12-12 ENCOUNTER — Other Ambulatory Visit: Payer: Self-pay | Admitting: Oncology

## 2023-12-12 VITALS — BP 96/59 | HR 94 | Temp 98.1°F | Resp 18 | Ht 73.0 in | Wt 124.0 lb

## 2023-12-12 DIAGNOSIS — C163 Malignant neoplasm of pyloric antrum: Secondary | ICD-10-CM | POA: Diagnosis not present

## 2023-12-12 DIAGNOSIS — C800 Disseminated malignant neoplasm, unspecified: Secondary | ICD-10-CM

## 2023-12-12 DIAGNOSIS — K219 Gastro-esophageal reflux disease without esophagitis: Secondary | ICD-10-CM

## 2023-12-12 DIAGNOSIS — Z95828 Presence of other vascular implants and grafts: Secondary | ICD-10-CM

## 2023-12-12 DIAGNOSIS — Z5111 Encounter for antineoplastic chemotherapy: Secondary | ICD-10-CM | POA: Diagnosis not present

## 2023-12-12 LAB — CMP (CANCER CENTER ONLY)
ALT: 13 U/L (ref 0–44)
AST: 17 U/L (ref 15–41)
Albumin: 2.4 g/dL — ABNORMAL LOW (ref 3.5–5.0)
Alkaline Phosphatase: 126 U/L (ref 38–126)
Anion gap: 10 (ref 5–15)
BUN: 18 mg/dL (ref 6–20)
CO2: 21 mmol/L — ABNORMAL LOW (ref 22–32)
Calcium: 8.6 mg/dL — ABNORMAL LOW (ref 8.9–10.3)
Chloride: 99 mmol/L (ref 98–111)
Creatinine: 0.58 mg/dL — ABNORMAL LOW (ref 0.61–1.24)
GFR, Estimated: >60 mL/min (ref >60)
Glucose, Bld: 129 mg/dL — ABNORMAL HIGH (ref 70–99)
Potassium: 4.2 mmol/L (ref 3.5–5.1)
Sodium: 131 mmol/L — ABNORMAL LOW (ref 135–145)
Total Bilirubin: 0.3 mg/dL (ref 0.0–1.2)
Total Protein: 4 g/dL — ABNORMAL LOW (ref 6.5–8.1)

## 2023-12-12 LAB — CBC WITH DIFFERENTIAL (CANCER CENTER ONLY)
Abs Immature Granulocytes: 0.97 10*3/uL — ABNORMAL HIGH (ref 0.00–0.07)
Basophils Absolute: 0.1 10*3/uL (ref 0.0–0.1)
Basophils Relative: 0 %
Eosinophils Absolute: 0.1 10*3/uL (ref 0.0–0.5)
Eosinophils Relative: 1 %
HCT: 37.6 % — ABNORMAL LOW (ref 39.0–52.0)
Hemoglobin: 12.2 g/dL — ABNORMAL LOW (ref 13.0–17.0)
Immature Granulocytes: 4 %
Lymphocytes Relative: 6 %
Lymphs Abs: 1.5 10*3/uL (ref 0.7–4.0)
MCH: 30.6 pg (ref 26.0–34.0)
MCHC: 32.4 g/dL (ref 30.0–36.0)
MCV: 94.2 fL (ref 80.0–100.0)
Monocytes Absolute: 1.4 10*3/uL — ABNORMAL HIGH (ref 0.1–1.0)
Monocytes Relative: 5 %
Neutro Abs: 23.3 10*3/uL — ABNORMAL HIGH (ref 1.7–7.7)
Neutrophils Relative %: 84 %
Platelet Count: 137 10*3/uL — ABNORMAL LOW (ref 150–400)
RBC: 3.99 MIL/uL — ABNORMAL LOW (ref 4.22–5.81)
RDW: 21.9 % — ABNORMAL HIGH (ref 11.5–15.5)
WBC Count: 27.4 10*3/uL — ABNORMAL HIGH (ref 4.0–10.5)
nRBC: 0 % (ref 0.0–0.2)

## 2023-12-12 MED ORDER — HYDROMORPHONE HCL 4 MG PO TABS: 4.0000 mg | ORAL_TABLET | ORAL | 0 refills | Status: DC | PRN

## 2023-12-12 MED ORDER — FLUCONAZOLE 100 MG PO TABS
100.0000 mg | ORAL_TABLET | Freq: Every day | ORAL | 0 refills | Status: AC
Start: 2023-12-12 — End: 2023-12-16

## 2023-12-12 MED ORDER — PANTOPRAZOLE SODIUM 40 MG PO TBEC: 40.0000 mg | DELAYED_RELEASE_TABLET | Freq: Two times a day (BID) | ORAL | 1 refills | Status: DC

## 2023-12-12 MED ORDER — SODIUM CHLORIDE 0.9% FLUSH
10.0000 mL | INTRAVENOUS | Status: DC | PRN
  Administered 2023-12-12: 10 mL via INTRAVENOUS

## 2023-12-12 MED ORDER — PROCHLORPERAZINE MALEATE 10 MG PO TABS: 10.0000 mg | ORAL_TABLET | Freq: Four times a day (QID) | ORAL | 1 refills | Status: DC | PRN

## 2023-12-12 MED ORDER — HEPARIN SOD (PORK) LOCK FLUSH 100 UNIT/ML IV SOLN
500.0000 [IU] | Freq: Once | INTRAVENOUS | Status: AC
  Administered 2023-12-12: 500 [IU] via INTRAVENOUS

## 2023-12-12 NOTE — Progress Notes (Signed)
 Patients port flushed without difficulty.  Good blood return noted with no bruising or swelling noted at site.  Patient remains accessed for treatment.

## 2023-12-12 NOTE — Patient Instructions (Signed)

## 2023-12-12 NOTE — Addendum Note (Signed)
 Addended by: Valyncia Wiens S on: 12/12/2023 02:04 PM   Modules accepted: Orders

## 2023-12-12 NOTE — Progress Notes (Signed)
 [facility] OFFICE PROGRESS NOTE   Diagnosis: Gastric cancer  INTERVAL HISTORY:   [name] returns as scheduled.  He completed a cycle of zolbetuximab 11/27/2023.  He completed another cycle of FOLFOX/nivolumab  11/28/2023.  He received IV fluids 12/05/2023, 12/06/2023, 12/08/2023.  He had nausea with zolbetuximab.  Overall nausea is better.  He periodically spits up "phlegm".  He does not characterize this as vomiting.  He is tolerating soft solids.  No diarrhea.  Recent constipation.  No rash.  Pain is better.  He takes hydromorphone  twice daily.  He is no longer taking OxyContin .  Cold sensitivity lasted about 5 days.  No numbness or tingling in the absence of cold exposure.  Objective:  Vital signs in last 24 hours:  Blood pressure (!) 96/59, pulse 94, temperature 98.1 F (36.7 C), temperature source Temporal, resp. rate 18, height 6\' 1"  (1.854 m), weight 124 lb (56.2 kg), SpO2 98%.    HEENT: Thick white coating over tongue. Resp: Lungs clear bilaterally. Cardio: Regular rate and rhythm. GI: Abdomen is mildly distended.  No hepatosplenomegaly. Vascular: No leg edema. Neuro: Alert and oriented.  Mild decrease in vibratory sense over the fingertips per tuning fork exam. Skin: No rash. Port-A-Cath without erythema.  Lab Results:  Lab Results  Component Value Date   WBC 27.4 (H) 12/12/2023   HGB 12.2 (L) 12/12/2023   HCT 37.6 (L) 12/12/2023   MCV 94.2 12/12/2023   PLT 137 (L) 12/12/2023   NEUTROABS 23.3 (H) 12/12/2023    Imaging:  No results found.  Medications: I have reviewed the patient's current medications.  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 Paracentesis 07/17/2023-acute inflammation, no malignant cells CT biopsy of right upper quadrant omental thickening 07/19/2023: Metastatic total differentiated carcinoma consistent with primary gastric carcinoma, normal mismatch repair protein expression , HER2 (0); foundation 1-microsatellite stable, tumor mutation burden 2, PD-L1 CPS 1; [place] PD-L1 CPS 60% POSITIVE expression of CLND18: 100% Cycle 1 FOLFOX/nivolumab  08/16/2023 Cycle 2 FOLFOX/nivolumab  08/30/2023 Cycle 3 FOLFOX/nivolumab  09/13/2023 CT abdomen/pelvis 09/28/2023: Large volume ascites, difficult to assess omentum, decreased conspicuity of hepatic hypodensities, nonspecific thickening at the pyloric region of the stomach Cycle 4 FOLFOX/nivolumab  10/04/2023, GCSF added Cycle 5 FOLFOX/nivolumab  10/18/2023, GCSF CTs 10/25/2023-focally dilated loop of jejunum in the left upper quadrant without obvious evidence of obstruction.  Focal thickening of the walls of the mid transverse colon.  Bowel wall thickening involving the gastric antrum and pylorus.  Moderate to large ascites with peritoneal enhancement and omental caking. Cycle 6 FOLFOX/nivolumab  11/13/2023, G-CSF Cycle 7 FOLFOX/nivolumab  11/28/2023, G-CSF, day 1 zolbetuximab 11/27/2023 Cycle 8 FOLFOX/nivolumab  plus zolbetuximab 12/13/2023   Pain secondary to #1 History of recurrent venous thrombosis-maintained on apixaban  anticoagulation Tobacco and alcohol use Family history of multiple cancers Anorexia/weight loss and constipation secondary to #1 - on multiple medications, not a candidate for abd feeding tube due to malignant ascites. Followed by Nutritionist  Left hip replacement June 2024 Hypokalemia, secondary to poor nutrition and n/v. Currently takes oral KCL BID Admission 09/27/23 - 09/29/23 for intractable N/V, negative for gastric  outlet or small bowel obstruction. CT showed no disease progression. Sx improved after paracentesis (5 L) Admission  10/23/23 - 11/01/2023 for intractable N/V, CT: ascites, possible ileus, and concern for peritoneal carcinomatosis. S/p paracentesis. EGD postponed to 3/25 due to febrile neutropenia, treated with abx, cultures negative. Endoscopy [name]): esophagitis and small hernia, no GOO or any role for stenting. Path negative for viral or fungal etiology. He was              Disposition: [name] appears stable.  He has completed 7 cycles of FOLFOX/nivolumab .  Zolbetuximab was added to the regimen 11/27/2023.  Aside from nausea during the zolbetuximab infusion he seems to be tolerating treatment well.  His performance status continues to be improved.  Pain is better.  Plan to proceed with cycle 8 FOLFOX/nivolumab  plus zolbetuximab 12/13/2023.  CBC and chemistry panel reviewed.  Labs adequate to proceed as above.  He will return for follow-up and treatment in 2 weeks.    Diana Forster ANP/GNP-BC   12/12/2023  1:41 PM

## 2023-12-12 NOTE — Progress Notes (Signed)
 Patient seen by Diana Forster NP today  Vitals are within treatment parameters:Yes   Labs are within treatment parameters: Yes   Treatment plan has been signed: Yes   Per physician team, Patient is ready for treatment and there are NO modifications to the treatment plan. Chemo on 12/13/23

## 2023-12-13 ENCOUNTER — Inpatient Hospital Stay

## 2023-12-13 ENCOUNTER — Telehealth: Payer: Self-pay

## 2023-12-13 VITALS — BP 106/75 | HR 100 | Temp 98.3°F | Resp 16

## 2023-12-13 DIAGNOSIS — C169 Malignant neoplasm of stomach, unspecified: Secondary | ICD-10-CM | POA: Diagnosis not present

## 2023-12-13 DIAGNOSIS — Z5111 Encounter for antineoplastic chemotherapy: Secondary | ICD-10-CM | POA: Diagnosis not present

## 2023-12-13 DIAGNOSIS — C163 Malignant neoplasm of pyloric antrum: Secondary | ICD-10-CM

## 2023-12-13 MED ORDER — SODIUM CHLORIDE 0.9 % IV SOLN: INTRAVENOUS | Status: DC

## 2023-12-13 MED ORDER — DEXTROSE 5 % IV SOLN: INTRAVENOUS | Status: DC

## 2023-12-13 MED ORDER — DEXTROSE 5 % IV SOLN
65.0000 mg/m2 | Freq: Once | INTRAVENOUS | Status: AC
  Administered 2023-12-13: 100 mg via INTRAVENOUS
  Filled 2023-12-13: qty 20

## 2023-12-13 MED ORDER — OLANZAPINE 5 MG PO TABS
5.0000 mg | ORAL_TABLET | Freq: Once | ORAL | Status: AC
Start: 2023-12-13 — End: 2023-12-13
  Administered 2023-12-13: 5 mg via ORAL
  Filled 2023-12-13: qty 1

## 2023-12-13 MED ORDER — PALONOSETRON HCL INJECTION 0.25 MG/5ML
0.2500 mg | Freq: Once | INTRAVENOUS | Status: AC
  Administered 2023-12-13: 0.25 mg via INTRAVENOUS
  Filled 2023-12-13: qty 5

## 2023-12-13 MED ORDER — PROCHLORPERAZINE MALEATE 10 MG PO TABS
5.0000 mg | ORAL_TABLET | Freq: Once | ORAL | Status: AC | PRN
  Administered 2023-12-13: 5 mg via ORAL
  Filled 2023-12-13: qty 1

## 2023-12-13 MED ORDER — LEUCOVORIN CALCIUM INJECTION 350 MG
400.0000 mg/m2 | Freq: Once | INTRAVENOUS | Status: AC
  Administered 2023-12-13: 672 mg via INTRAVENOUS
  Filled 2023-12-13: qty 33.6

## 2023-12-13 MED ORDER — SODIUM CHLORIDE 0.9 % IV SOLN
240.0000 mg | Freq: Once | INTRAVENOUS | Status: AC
  Administered 2023-12-13: 240 mg via INTRAVENOUS
  Filled 2023-12-13: qty 24

## 2023-12-13 MED ORDER — SODIUM CHLORIDE 0.9 % IV SOLN
2400.0000 mg/m2 | INTRAVENOUS | Status: DC
  Administered 2023-12-13: 4050 mg via INTRAVENOUS
  Filled 2023-12-13: qty 81

## 2023-12-13 MED ORDER — ZOLBETUXIMAB-CLZB CHEMO 100MG/5ML IV SOLN
418.0000 mg/m2 | Freq: Once | INTRAVENOUS | Status: AC
  Administered 2023-12-13: 700 mg via INTRAVENOUS
  Filled 2023-12-13: qty 35

## 2023-12-13 MED ORDER — LEUCOVORIN CALCIUM INJECTION 350 MG
400.0000 mg/m2 | Freq: Once | INTRAVENOUS | Status: DC
  Filled 2023-12-13: qty 33.6

## 2023-12-13 MED ORDER — APREPITANT 130 MG/18ML IV EMUL
130.0000 mg | Freq: Once | INTRAVENOUS | Status: AC
  Administered 2023-12-13: 130 mg via INTRAVENOUS
  Filled 2023-12-13: qty 18

## 2023-12-13 MED ORDER — DEXAMETHASONE SODIUM PHOSPHATE 10 MG/ML IJ SOLN
10.0000 mg | Freq: Once | INTRAMUSCULAR | Status: AC
  Administered 2023-12-13: 10 mg via INTRAVENOUS
  Filled 2023-12-13: qty 1

## 2023-12-13 NOTE — Telephone Encounter (Signed)
 Notified the pt wife regarding her FMLA on the behalf of her husband. Let her know the forms were completed,faxed with confirmation received. Amalia Badder ,RN at [facility] was able to fax the forms to the third party and gave the pt wife her hard  copy. No questions or concerns at his time.

## 2023-12-14 DIAGNOSIS — C169 Malignant neoplasm of stomach, unspecified: Secondary | ICD-10-CM | POA: Diagnosis not present

## 2023-12-15 ENCOUNTER — Inpatient Hospital Stay

## 2023-12-15 VITALS — BP 104/87 | HR 102 | Temp 97.7°F | Resp 18

## 2023-12-15 DIAGNOSIS — Z5111 Encounter for antineoplastic chemotherapy: Secondary | ICD-10-CM | POA: Diagnosis not present

## 2023-12-15 DIAGNOSIS — C163 Malignant neoplasm of pyloric antrum: Secondary | ICD-10-CM

## 2023-12-15 MED ORDER — PEGFILGRASTIM-JMDB 6 MG/0.6ML ~~LOC~~ SOSY
6.0000 mg | PREFILLED_SYRINGE | Freq: Once | SUBCUTANEOUS | Status: AC
  Administered 2023-12-15: 6 mg via SUBCUTANEOUS
  Filled 2023-12-15: qty 0.6

## 2023-12-15 MED ORDER — SODIUM CHLORIDE 0.9% FLUSH
10.0000 mL | INTRAVENOUS | Status: DC | PRN
  Administered 2023-12-15: 10 mL

## 2023-12-15 MED ORDER — HEPARIN SOD (PORK) LOCK FLUSH 100 UNIT/ML IV SOLN
500.0000 [IU] | Freq: Once | INTRAVENOUS | Status: AC | PRN
  Administered 2023-12-15: 500 [IU]

## 2023-12-15 NOTE — Patient Instructions (Signed)

## 2023-12-20 ENCOUNTER — Other Ambulatory Visit: Payer: Self-pay | Admitting: *Deleted

## 2023-12-20 ENCOUNTER — Telehealth: Payer: Self-pay | Admitting: Nurse Practitioner

## 2023-12-20 DIAGNOSIS — C163 Malignant neoplasm of pyloric antrum: Secondary | ICD-10-CM

## 2023-12-20 DIAGNOSIS — C800 Disseminated malignant neoplasm, unspecified: Secondary | ICD-10-CM

## 2023-12-20 NOTE — Telephone Encounter (Signed)
 PT and wife aware of scheduled appt. For 12/21/23.

## 2023-12-20 NOTE — Progress Notes (Signed)
 Mrs. Fairburn called to report [name] has been sleeping all day for last 2 days and she is asking for him to receive IVF tomorrow. Per Diana Forster, NP: schedule for visit with Lacie, NP tomorrow at 10:00 with labs prior and fluids following. Scheduler notified and IVF and lab orders placed.

## 2023-12-20 NOTE — Telephone Encounter (Signed)
 Appts have been rescheduled; spouse aware of appt changes.   Per Rogene Claude, RN: Silvana Drones: [facility] just pulled Lacie back there tomorrow. Please schedule him with Edwina Gram tomorrow at [phone] w/lab/flush prior and fluids afterwards. Call wife please

## 2023-12-21 ENCOUNTER — Inpatient Hospital Stay

## 2023-12-21 ENCOUNTER — Ambulatory Visit: Admitting: Nurse Practitioner

## 2023-12-21 ENCOUNTER — Inpatient Hospital Stay (HOSPITAL_BASED_OUTPATIENT_CLINIC_OR_DEPARTMENT_OTHER): Admitting: Nurse Practitioner

## 2023-12-21 ENCOUNTER — Ambulatory Visit

## 2023-12-21 ENCOUNTER — Other Ambulatory Visit

## 2023-12-21 ENCOUNTER — Encounter: Payer: Self-pay | Admitting: Nurse Practitioner

## 2023-12-21 ENCOUNTER — Other Ambulatory Visit: Payer: Self-pay

## 2023-12-21 VITALS — BP 111/78 | HR 97 | Temp 98.5°F | Resp 18

## 2023-12-21 VITALS — BP 97/82 | HR 117 | Temp 97.9°F | Resp 22 | Wt 118.9 lb

## 2023-12-21 DIAGNOSIS — C163 Malignant neoplasm of pyloric antrum: Secondary | ICD-10-CM

## 2023-12-21 DIAGNOSIS — Z95828 Presence of other vascular implants and grafts: Secondary | ICD-10-CM

## 2023-12-21 DIAGNOSIS — E860 Dehydration: Secondary | ICD-10-CM | POA: Diagnosis not present

## 2023-12-21 DIAGNOSIS — Z5111 Encounter for antineoplastic chemotherapy: Secondary | ICD-10-CM | POA: Diagnosis not present

## 2023-12-21 DIAGNOSIS — C800 Disseminated malignant neoplasm, unspecified: Secondary | ICD-10-CM

## 2023-12-21 LAB — CBC WITH DIFFERENTIAL (CANCER CENTER ONLY)
Abs Immature Granulocytes: 0.65 10*3/uL — ABNORMAL HIGH (ref 0.00–0.07)
Basophils Absolute: 0.1 10*3/uL (ref 0.0–0.1)
Basophils Relative: 0 %
Eosinophils Absolute: 0.1 10*3/uL (ref 0.0–0.5)
Eosinophils Relative: 0 %
HCT: 30.6 % — ABNORMAL LOW (ref 39.0–52.0)
Hemoglobin: 9.8 g/dL — ABNORMAL LOW (ref 13.0–17.0)
Immature Granulocytes: 3 %
Lymphocytes Relative: 6 %
Lymphs Abs: 1.2 10*3/uL (ref 0.7–4.0)
MCH: 30.3 pg (ref 26.0–34.0)
MCHC: 32 g/dL (ref 30.0–36.0)
MCV: 94.7 fL (ref 80.0–100.0)
Monocytes Absolute: 1.1 10*3/uL — ABNORMAL HIGH (ref 0.1–1.0)
Monocytes Relative: 6 %
Neutro Abs: 15.9 10*3/uL — ABNORMAL HIGH (ref 1.7–7.7)
Neutrophils Relative %: 85 %
Platelet Count: 42 10*3/uL — ABNORMAL LOW (ref 150–400)
RBC: 3.23 MIL/uL — ABNORMAL LOW (ref 4.22–5.81)
RDW: 19.9 % — ABNORMAL HIGH (ref 11.5–15.5)
Smear Review: DECREASED
WBC Count: 18.9 10*3/uL — ABNORMAL HIGH (ref 4.0–10.5)
nRBC: 0 % (ref 0.0–0.2)

## 2023-12-21 LAB — CMP (CANCER CENTER ONLY)
ALT: 12 U/L (ref 0–44)
AST: 20 U/L (ref 15–41)
Albumin: 2.7 g/dL — ABNORMAL LOW (ref 3.5–5.0)
Alkaline Phosphatase: 159 U/L — ABNORMAL HIGH (ref 38–126)
Anion gap: 13 (ref 5–15)
BUN: 10 mg/dL (ref 6–20)
CO2: 23 mmol/L (ref 22–32)
Calcium: 8.4 mg/dL — ABNORMAL LOW (ref 8.9–10.3)
Chloride: 96 mmol/L — ABNORMAL LOW (ref 98–111)
Creatinine: 0.4 mg/dL — ABNORMAL LOW (ref 0.61–1.24)
GFR, Estimated: >60 mL/min (ref >60)
Glucose, Bld: 87 mg/dL (ref 70–99)
Potassium: 3.6 mmol/L (ref 3.5–5.1)
Sodium: 132 mmol/L — ABNORMAL LOW (ref 135–145)
Total Bilirubin: 0.4 mg/dL (ref 0.0–1.2)
Total Protein: 4.5 g/dL — ABNORMAL LOW (ref 6.5–8.1)

## 2023-12-21 LAB — MAGNESIUM: Magnesium: 1.5 mg/dL — ABNORMAL LOW (ref 1.7–2.4)

## 2023-12-21 MED ORDER — SODIUM CHLORIDE 0.9 % IV SOLN: INTRAVENOUS | Status: AC

## 2023-12-21 MED ORDER — HEPARIN SOD (PORK) LOCK FLUSH 100 UNIT/ML IV SOLN: 500.0000 [IU] | Freq: Once | INTRAVENOUS | Status: DC

## 2023-12-21 MED ORDER — MAGNESIUM SULFATE 2 GM/50ML IV SOLN
2.0000 g | Freq: Once | INTRAVENOUS | Status: AC
  Administered 2023-12-21: 2 g via INTRAVENOUS
  Filled 2023-12-21: qty 50

## 2023-12-21 MED ORDER — SODIUM CHLORIDE 0.9% FLUSH: 10.0000 mL | INTRAVENOUS | Status: DC | PRN

## 2023-12-21 MED ORDER — SODIUM CHLORIDE 0.9% FLUSH
10.0000 mL | Freq: Once | INTRAVENOUS | Status: AC
  Administered 2023-12-21: 10 mL via INTRAVENOUS

## 2023-12-21 NOTE — Progress Notes (Signed)
 Patient presents today for IVF based on labs. Patient seen by Diana Forster NP prior to assess patient and labs.   Based on labs patient to get 1 liter of Normal Saline and 2g of magnesium .   Upon arrival to infusion area patient c/o port being sore and burning. No redness noted and blood returned when accessed, was going to change dressing and flush but patient requesting to have access removed and port re accessed. No redness noted at port site, port de-accessed. New access placed, flushed without difficulty. No bruising or swelling noted at site.  Good blood return noted. Gauze added to prevent pulling of port accessed.    IVF infusing without complaint.  Patient had episode of vomiting and belching x2 after drinking chocolate ensure. Per family patient does this every time he drinks protein shakes. Diana Forster NP made aware. Patient offered to be seen again tomorrow but has ortho appointment he "does not want to miss." Patient advised call and follow-up if it continues, verbalized understanding.    Patient left via wheelchair, with family, in stable condition.  Vital signs stable at discharge.  Follow up as needed/scheduled.

## 2023-12-21 NOTE — Patient Instructions (Signed)

## 2023-12-21 NOTE — Patient Instructions (Signed)
 [facility] - A DEPT OF [facility]  Discharge Instructions: Thank you for choosing  Cancer Center to provide your oncology and hematology care.   If you have a lab appointment with the Cancer Center, please go directly to the Cancer Center and check in at the registration area.   Wear comfortable clothing and clothing appropriate for easy access to any Portacath or PICC line.   We strive to give you quality time with your provider. You may need to reschedule your appointment if you arrive late (15 or more minutes).  Arriving late affects you and other patients whose appointments are after yours.  Also, if you miss three or more appointments without notifying the office, you may be dismissed from the clinic at the provider's discretion.      For prescription refill requests, have your pharmacy contact our office and allow 72 hours for refills to be completed.    Today you received the following Magnesium  Sulfate.  Magnesium  Sulfate Injection What is this medication? MAGNESIUM  SULFATE (mag NEE zee um SUL fate) prevents and treats low levels of magnesium  in your body. It may also be used to prevent and treat seizures during pregnancy in people with high blood pressure disorders, such as preeclampsia or eclampsia. Magnesium  plays an important role in maintaining the health of your muscles and nervous system. This medicine may be used for other purposes; ask your health care provider or pharmacist if you have questions. What should I tell my care team before I take this medication? They need to know if you have any of these conditions: Heart disease History of irregular heart beat Kidney disease An unusual or allergic reaction to magnesium  sulfate, medications, foods, dyes, or preservatives Pregnant or trying to get pregnant Breast-feeding How should I use this medication? This medication is for infusion into a vein. It is given in a hospital or  clinic setting. Talk to your care team about the use of this medication in children. While this medication may be prescribed for selected conditions, precautions do apply. Overdosage: If you think you have taken too much of this medicine contact a poison control center or emergency room at once. NOTE: This medicine is only for you. Do not share this medicine with others. What if I miss a dose? This does not apply. What may interact with this medication? Certain medications for anxiety or sleep Certain medications for seizures, such phenobarbital Digoxin Medications that relax muscles for surgery Narcotic medications for pain This list may not describe all possible interactions. Give your health care provider a list of all the medicines, herbs, non-prescription drugs, or dietary supplements you use. Also tell them if you smoke, drink alcohol, or use illegal drugs. Some items may interact with your medicine. What should I watch for while using this medication? Your condition will be monitored carefully while you are receiving this medication. You may need blood work done while you are receiving this medication. What side effects may I notice from receiving this medication? Side effects that you should report to your care team as soon as possible: Allergic reactions--skin rash, itching, hives, swelling of the face, lips, tongue, or throat High magnesium  level--confusion, drowsiness, facial flushing, redness, sweating, muscle weakness, fast or irregular heartbeat, trouble breathing Low blood pressure--dizziness, feeling faint or lightheaded, blurry vision Side effects that usually do not require medical attention (report to your care team if they continue or are bothersome): Headache Nausea This list may not describe  all possible side effects. Call your doctor for medical advice about side effects. You may report side effects to FDA at 1-800-FDA-1088. Where should I keep my medication? This  medication is given in a hospital or clinic and will not be stored at home. NOTE: This sheet is a summary. It may not cover all possible information. If you have questions about this medicine, talk to your doctor, pharmacist, or health care provider.  2024 Elsevier/Gold Standard (2021-04-07 00:00:00)   To help prevent nausea and vomiting after your treatment, we encourage you to take your nausea medication as directed.  BELOW ARE SYMPTOMS THAT SHOULD BE REPORTED IMMEDIATELY: *FEVER GREATER THAN 100.4 F (38 C) OR HIGHER *CHILLS OR SWEATING *NAUSEA AND VOMITING THAT IS NOT CONTROLLED WITH YOUR NAUSEA MEDICATION *UNUSUAL SHORTNESS OF BREATH *UNUSUAL BRUISING OR BLEEDING *URINARY PROBLEMS (pain or burning when urinating, or frequent urination) *BOWEL PROBLEMS (unusual diarrhea, constipation, pain near the anus) TENDERNESS IN MOUTH AND THROAT WITH OR WITHOUT PRESENCE OF ULCERS (sore throat, sores in mouth, or a toothache) UNUSUAL RASH, SWELLING OR PAIN  UNUSUAL VAGINAL DISCHARGE OR ITCHING   Items with * indicate a potential emergency and should be followed up as soon as possible or go to the Emergency Department if any problems should occur.  Please show the CHEMOTHERAPY ALERT CARD or IMMUNOTHERAPY ALERT CARD at check-in to the Emergency Department and triage nurse.  Should you have questions after your visit or need to cancel or reschedule your appointment, please contact [facility] - A DEPT OF [facility]  Dept: [phone]  and follow the prompts.  Office hours are 8:00 a.m. to 4:30 p.m. Monday - Friday. Please note that voicemails left after 4:00 p.m. may not be returned until the following business day.  We are closed weekends and major holidays. You have access to a nurse at all times for urgent questions. Please call the main number to the clinic Dept: [phone] and follow the prompts.   For any non-urgent questions, you may also contact your  provider using MyChart. We now offer e-Visits for anyone 43 and older to request care online for non-urgent symptoms. For details visit [email or web address].   Also download the MyChart app! Go to the app store, search "MyChart", open the app, select [facility], and log in with your MyChart username and password.

## 2023-12-21 NOTE — Progress Notes (Signed)
 [facility] OFFICE PROGRESS NOTE   Diagnosis: Gastric cancer  INTERVAL HISTORY:   [name] returns prior to scheduled follow-up.  He completed cycle 8 FOLFOX/nivolumab  plus zolbetuximab 12/13/2023.  His wife contacted the office yesterday requesting he receive IV fluids today.  Energy level has been decreased for the past 3 to 4 days.  Poor oral intake during this time as well.  He is nauseated.  He has not vomited at home.  He "coughed up phlegm" this morning.  No diarrhea.  Bowels moving every 2 to 3 days.  He noted a small amount of bright red blood with a bowel movement following recent chemotherapy.  No black stools.  He has intermittent abdominal pain.  He takes Dilaudid  twice a day with good relief.  He is sleeping more.  His wife notes that he does this when he is dehydrated.  No fever.  No cough.  No shortness of breath.  No rash.  Objective:  Vital signs in last 24 hours:  Blood pressure 97/82, pulse (!) 117, temperature 97.9 F (36.6 C), temperature source Temporal, resp. rate (!) 22, weight 118 lb 14.4 oz (53.9 kg), SpO2 100%.    HEENT: No thrush or ulcers.  Mucous membranes are moist. Resp: Lungs clear bilaterally. Cardio: Regular, tachycardic. GI: Abdomen appears mildly distended.  No hepatosplenomegaly. Vascular: No leg edema. Neuro: Alert and oriented. Skin: Palms without erythema.  Skin turgor is decreased. Port-A-Cath without erythema.  Lab Results:  Lab Results  Component Value Date   WBC 18.9 (H) 12/21/2023   HGB 9.8 (L) 12/21/2023   HCT 30.6 (L) 12/21/2023   MCV 94.7 12/21/2023   PLT 42 (L) 12/21/2023   NEUTROABS 15.9 (H) 12/21/2023    Imaging:  No results found.  Medications: I have reviewed the patient's current medications.  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 Paracentesis 07/17/2023-acute inflammation, no malignant cells CT biopsy of right upper quadrant omental thickening 07/19/2023: Metastatic total differentiated carcinoma consistent with primary gastric carcinoma, normal mismatch repair protein expression , HER2 (0); foundation 1-microsatellite stable, tumor mutation burden 2, PD-L1 CPS 1; [place] PD-L1 CPS 60% POSITIVE expression of CLND18: 100% Cycle 1 FOLFOX/nivolumab  08/16/2023 Cycle 2 FOLFOX/nivolumab  08/30/2023 Cycle 3 FOLFOX/nivolumab  09/13/2023 CT abdomen/pelvis 09/28/2023: Large volume ascites, difficult to assess omentum, decreased conspicuity of hepatic hypodensities, nonspecific thickening at the pyloric region of the stomach Cycle 4 FOLFOX/nivolumab  10/04/2023, GCSF added Cycle 5 FOLFOX/nivolumab  10/18/2023, GCSF CTs 10/25/2023-focally dilated loop of jejunum in the left upper quadrant without obvious evidence of obstruction.  Focal thickening of the walls of the mid transverse colon.  Bowel wall thickening involving the gastric antrum and pylorus.  Moderate to large ascites with peritoneal enhancement and omental caking. Cycle 6 FOLFOX/nivolumab  11/13/2023, G-CSF Cycle 7 FOLFOX/nivolumab  11/28/2023, G-CSF, day 1 zolbetuximab 11/27/2023 Cycle 8 FOLFOX/nivolumab  plus zolbetuximab 12/13/2023   Pain secondary to #1 History of recurrent venous thrombosis-maintained on apixaban  anticoagulation Tobacco and alcohol use Family history of multiple cancers Anorexia/weight loss and constipation secondary to #1 - on multiple medications, not a candidate for abd feeding tube due to malignant ascites. Followed by Nutritionist  Left hip replacement June 2024 Hypokalemia,  secondary  to poor nutrition and n/v. Currently takes oral KCL BID Admission 09/27/23 - 09/29/23 for intractable N/V, negative for gastric outlet or small bowel obstruction. CT showed no disease progression. Sx improved after paracentesis (5 L) Admission 10/23/23 - 11/01/2023 for intractable N/V, CT: ascites, possible ileus, and concern for peritoneal carcinomatosis. S/p paracentesis. EGD postponed to 3/25 due to febrile neutropenia, treated with abx, cultures negative. Endoscopy [name]): esophagitis and small hernia, no GOO or any role for stenting. Path negative for viral or fungal etiology. He was      Disposition: [name] appears stable.  He completed cycle 8 FOLFOX/nivolumab  plus zolbetuximab 12/13/2023.  He presents today for an unscheduled visit due to weakness.  Oral intake is poor.  He appears dehydrated.  He will receive IV fluids and IV magnesium  today.  He will continue to work on increasing fluid intake by mouth.  We reviewed the CBC and chemistry panel.  He has moderate thrombocytopenia.  He is maintained on Eliquis  for a history of DVTs.  He and his wife confirm the last DVT was "years ago".  He is at increased risk for bleeding due to the gastric cancer.  We are placing Eliquis  on hold until the platelet count is higher.  He and his wife understand to contact the office with bleeding and/or signs of DVT.  He will return for follow-up as scheduled 12/27/2023.  We are available to see him sooner if needed.  Plan reviewed with [name].  [name] ANP/GNP-BC   12/21/2023  9:12 AM

## 2023-12-22 ENCOUNTER — Ambulatory Visit: Payer: Medicaid Other | Admitting: Orthopaedic Surgery

## 2023-12-22 ENCOUNTER — Other Ambulatory Visit (INDEPENDENT_AMBULATORY_CARE_PROVIDER_SITE_OTHER): Payer: Self-pay

## 2023-12-22 ENCOUNTER — Encounter: Payer: Self-pay | Admitting: Oncology

## 2023-12-22 DIAGNOSIS — Z96642 Presence of left artificial hip joint: Secondary | ICD-10-CM

## 2023-12-22 NOTE — Progress Notes (Signed)
 Office Visit Note   Patient: [name]           Date of Birth: 04-Nov-1960           MRN: [id number] Visit Date: 12/22/2023              Requested by: Anthony Hollow, MD [address] [place],  [place] [address] PCP: Anthony Hollow, MD   Assessment & Plan: Visit Diagnoses:  1. Status post total replacement of left hip     Plan: History of Present Illness [name] is a 63 year old male with stage four stomach cancer who presents for follow-up after left hip replacement surgery. He is accompanied by his wife.  He underwent left hip replacement surgery and currently has no pain or issues with the hip. There is no pain when the hip is moved or rotated. However, he experiences weakness and is unable to stand, which he attributes to the effects of chemotherapy and cancer.  He has significant weight loss and difficulty maintaining weight, currently weighing 118 pounds. Nausea and vomiting prevent him from keeping food down. He is not currently working and relies on his wife for assistance. He reports a lack of exercise and energy, contributing to his overall weakness.  Physical Exam MEASUREMENTS: Weight- 118. MUSCULOSKELETAL: Hip range of motion without pain. Hip scar well-healed.  Assessment and Plan 1 year postop left total hip arthroplasty - doing very well, having no issues - continue dental prophylaxis for 1 more year - follow up 1 year with repeat radiographs  Stage 4 stomach cancer - on chemo - causing weight loss and cachexia  Follow-Up Instructions: Return in about 1 year (around 12/21/2024).   Orders:  Orders Placed This Encounter  Procedures   XR HIP UNILAT W OR W/O PELVIS 2-3 VIEWS LEFT   No orders of the defined types were placed in this encounter.    Subjective: Chief Complaint  Patient presents with   Left Hip - Follow-up    HPI  Review of Systems  Constitutional: Negative.   HENT: Negative.    Eyes: Negative.    Respiratory: Negative.    Cardiovascular: Negative.   Gastrointestinal:  Positive for abdominal pain.  Endocrine: Negative.   Genitourinary: Negative.   Musculoskeletal:  Positive for arthralgias and myalgias.  Skin: Negative.   Allergic/Immunologic: Negative.   Neurological: Negative.   Hematological: Negative.   Psychiatric/Behavioral: Negative.    All other systems reviewed and are negative.    Physical Exam Vitals and nursing note reviewed.  Constitutional:      Appearance: He is cachectic.  HENT:     Head: Normocephalic and atraumatic.  Pulmonary:     Effort: Pulmonary effort is normal.  Abdominal:     Palpations: Abdomen is soft.  Skin:    General: Skin is warm.  Neurological:     Mental Status: He is alert and oriented to person, place, and time.  Psychiatric:        Behavior: Behavior normal.        Thought Content: Thought content normal.        Judgment: Judgment normal.     Imaging: XR HIP UNILAT W OR W/O PELVIS 2-3 VIEWS LEFT Result Date: 12/22/2023 Stable left total hip replacement without complication    PMFS History: Patient Active Problem List   Diagnosis Date Noted   Abdominal pain 10/31/2023   Metastatic cancer (HCC) 10/25/2023   Protein-calorie malnutrition, severe 10/25/2023  Adenocarcinoma carcinomatosis (HCC) 10/25/2023   Intractable nausea and vomiting 09/27/2023   Hypokalemia 09/27/2023   Hyponatremia 09/27/2023   Gastric cancer (HCC) 08/03/2023   Malignant ascites 07/19/2023   Abdominal pain, chronic, epigastric 07/19/2023   Abnormal loss of weight 07/18/2023   Acute gastritis without hemorrhage 07/18/2023   GI bleed 07/16/2023   Abnormal CT of the abdomen 07/16/2023   Status post total replacement of left hip 02/03/2023   Primary osteoarthritis of left hip 02/02/2023   Avascular necrosis of bone of left hip (HCC) 12/23/2022   High risk heterosexual behavior 02/05/2021   History of colonic polyps 11/04/2019   PCP NOTES  >>>>>>>>>>>>>> 07/26/2016   GERD (gastroesophageal reflux disease) 07/26/2016   Annual physical exam 07/25/2016   HTN (hypertension) 09/16/2014   Nausea and vomiting in adult 11/29/2013   Long term current use of anticoagulant 10/06/2010   Renal infarct (HCC) 10/06/2010   Dyslipidemia 09/03/2009   CHEST PAIN-UNSPECIFIED 09/03/2009   SHOULDER PAIN, RIGHT 03/02/2009   PERIPHERAL VASCULAR DISEASE 02/11/2009   SKIN LESION 02/04/2008   Polysubstance abuse (HCC) 05/28/2007   DISORDER, VASCULAR, KIDNEY 05/28/2007   DVT, HX OF 12/25/2006   Past Medical History:  Diagnosis Date   Chest pain    stres test neg x 2, cath 5/12; minimal LAD irregs; no obs CAD; normal LVF   Clotting disorder (HCC)    dvt   Deep vein thrombosis (HCC) 10/06/2010   GERD (gastroesophageal reflux disease)    History of DVT of lower extremity    on chronic coumadin    Hypertension    Internal hemorrhoids    Cscope 2007   PAD (peripheral artery disease) (HCC)    a. left leg ischemia tx wtih embolectomy of left fem, pop, tib arteries with Dr. Shaunna Delaware - 08/2006;  b. known occlusion of right pop with collats - med Rx;  c.ABI's 8/09: R 1.0; L 0.91    PFO (patent foramen ovale)    per 08/2006 discharge summary, TEE showed EF 60%, small PFO with minimal right-to-left shunt with Valsalva; not felt to be the source of emboli, lifelong Coumadin  recommended   Pneumonia    history of   Polysubstance abuse (HCC)    marijuana only   Renal infarct (HCC)    R    Family History  Problem Relation Age of Onset   Hypertension Mother    Breast cancer Mother    Hypertension Father    CAD Father    Diabetes Father    Lung cancer Maternal Uncle    Pancreatic cancer Brother    Prostate cancer Maternal Uncle    Heart disease Paternal Grandmother    Colon cancer Neg Hx    Stomach cancer Neg Hx    Esophageal cancer Neg Hx    Rectal cancer Neg Hx     Past Surgical History:  Procedure Laterality Date   BIOPSY  07/18/2023    Procedure: BIOPSY;  Surgeon: Albertina Hugger, MD;  Location: [facility] ENDOSCOPY;  Service: Gastroenterology;;   BIOPSY OF SKIN SUBCUTANEOUS TISSUE AND/OR MUCOUS MEMBRANE  10/31/2023   Procedure: BIOPSY, SKIN, SUBCUTANEOUS TISSUE, OR MUCOUS MEMBRANE;  Surgeon: Elois Hair, MD;  Location: [facility] ENDOSCOPY;  Service: Gastroenterology;;   CHOLECYSTECTOMY     COLONOSCOPY     ESOPHAGOGASTRODUODENOSCOPY N/A 10/31/2023   Procedure: EGD (ESOPHAGOGASTRODUODENOSCOPY);  Surgeon: Elois Hair, MD;  Location: [facility] ENDOSCOPY;  Service: Gastroenterology;  Laterality: N/A;   ESOPHAGOGASTRODUODENOSCOPY (EGD) WITH PROPOFOL  N/A 07/18/2023   Procedure: ESOPHAGOGASTRODUODENOSCOPY (EGD)  WITH PROPOFOL ;  Surgeon: Albertina Hugger, MD;  Location: [facility] ENDOSCOPY;  Service: Gastroenterology;  Laterality: N/A;   INGUINAL HERNIA REPAIR Left 07/16/2019   Procedure: LAPAROSCOPIC LEFT INGUINAL HERNIA REPAIR WITH MESH;  Surgeon: Shela Derby, MD;  Location: [facility] OR;  Service: General;  Laterality: Left;   IR IMAGING GUIDED PORT INSERTION  08/08/2023   IR PARACENTESIS  07/17/2023   IR PARACENTESIS  10/20/2023   left leg blood clot removal 2009  2009   TONSILLECTOMY     TOTAL HIP ARTHROPLASTY Left 02/03/2023   Procedure: LEFT TOTAL HIP ARTHROPLASTY ANTERIOR APPROACH;  Surgeon: Wes Hamman, MD;  Location: [facility] OR;  Service: Orthopedics;  Laterality: Left;  3-C   WRIST SURGERY Left    Social History   Occupational History   Occupation: [employer], driver   Tobacco Use   Smoking status: Every Day    Current packs/day: 1.00    Average packs/day: 1 pack/day for 30.0 years (30.0 ttl pk-yrs)    Types: Cigarettes   Smokeless tobacco: Never   Tobacco comments:    1 to 1.5 ppd   Vaping Use   Vaping status: Never Used  Substance and Sexual Activity   Alcohol use: Yes    Alcohol/week: 21.0 standard drinks of alcohol    Types: 21 Standard drinks or equivalent per week    Comment: 3 drinks per day, every day   Drug use: Yes     Types: Marijuana    Comment: smokes every day   Sexual activity: Yes    Partners: Female

## 2023-12-24 ENCOUNTER — Other Ambulatory Visit: Payer: Self-pay | Admitting: Oncology

## 2023-12-27 ENCOUNTER — Inpatient Hospital Stay

## 2023-12-27 ENCOUNTER — Telehealth: Payer: Self-pay | Admitting: *Deleted

## 2023-12-27 ENCOUNTER — Inpatient Hospital Stay (HOSPITAL_BASED_OUTPATIENT_CLINIC_OR_DEPARTMENT_OTHER): Admitting: Oncology

## 2023-12-27 VITALS — BP 91/68 | HR 110 | Temp 97.6°F | Resp 17 | Ht 73.0 in | Wt 118.8 lb

## 2023-12-27 DIAGNOSIS — Z95828 Presence of other vascular implants and grafts: Secondary | ICD-10-CM

## 2023-12-27 DIAGNOSIS — C163 Malignant neoplasm of pyloric antrum: Secondary | ICD-10-CM

## 2023-12-27 DIAGNOSIS — Z5111 Encounter for antineoplastic chemotherapy: Secondary | ICD-10-CM | POA: Diagnosis not present

## 2023-12-27 LAB — CBC WITH DIFFERENTIAL (CANCER CENTER ONLY)
Abs Immature Granulocytes: 0.3 10*3/uL — ABNORMAL HIGH (ref 0.00–0.07)
Basophils Absolute: 0 10*3/uL (ref 0.0–0.1)
Basophils Relative: 0 %
Eosinophils Absolute: 0 10*3/uL (ref 0.0–0.5)
Eosinophils Relative: 0 %
HCT: 32.3 % — ABNORMAL LOW (ref 39.0–52.0)
Hemoglobin: 10.4 g/dL — ABNORMAL LOW (ref 13.0–17.0)
Immature Granulocytes: 2 %
Lymphocytes Relative: 7 %
Lymphs Abs: 1.1 10*3/uL (ref 0.7–4.0)
MCH: 30.6 pg (ref 26.0–34.0)
MCHC: 32.2 g/dL (ref 30.0–36.0)
MCV: 95 fL (ref 80.0–100.0)
Monocytes Absolute: 1.2 10*3/uL — ABNORMAL HIGH (ref 0.1–1.0)
Monocytes Relative: 7 %
Neutro Abs: 13.9 10*3/uL — ABNORMAL HIGH (ref 1.7–7.7)
Neutrophils Relative %: 84 %
Platelet Count: 164 10*3/uL (ref 150–400)
RBC: 3.4 MIL/uL — ABNORMAL LOW (ref 4.22–5.81)
RDW: 18.9 % — ABNORMAL HIGH (ref 11.5–15.5)
WBC Count: 16.6 10*3/uL — ABNORMAL HIGH (ref 4.0–10.5)
nRBC: 0.3 % — ABNORMAL HIGH (ref 0.0–0.2)

## 2023-12-27 LAB — CMP (CANCER CENTER ONLY)
ALT: 9 U/L (ref 0–44)
AST: 16 U/L (ref 15–41)
Albumin: 2.3 g/dL — ABNORMAL LOW (ref 3.5–5.0)
Alkaline Phosphatase: 145 U/L — ABNORMAL HIGH (ref 38–126)
Anion gap: 13 (ref 5–15)
BUN: 11 mg/dL (ref 6–20)
CO2: 18 mmol/L — ABNORMAL LOW (ref 22–32)
Calcium: 8 mg/dL — ABNORMAL LOW (ref 8.9–10.3)
Chloride: 102 mmol/L (ref 98–111)
Creatinine: 0.42 mg/dL — ABNORMAL LOW (ref 0.61–1.24)
GFR, Estimated: >60 mL/min (ref >60)
Glucose, Bld: 108 mg/dL — ABNORMAL HIGH (ref 70–99)
Potassium: 3.6 mmol/L (ref 3.5–5.1)
Sodium: 133 mmol/L — ABNORMAL LOW (ref 135–145)
Total Bilirubin: 0.3 mg/dL (ref 0.0–1.2)
Total Protein: 4 g/dL — ABNORMAL LOW (ref 6.5–8.1)

## 2023-12-27 MED ORDER — HEPARIN SOD (PORK) LOCK FLUSH 100 UNIT/ML IV SOLN
500.0000 [IU] | Freq: Once | INTRAVENOUS | Status: AC
  Administered 2023-12-27: 500 [IU] via INTRAVENOUS

## 2023-12-27 MED ORDER — SODIUM CHLORIDE 0.9% FLUSH
10.0000 mL | INTRAVENOUS | Status: DC | PRN
  Administered 2023-12-27: 10 mL via INTRAVENOUS

## 2023-12-27 NOTE — Telephone Encounter (Signed)
 Called Mrs. Fiorella with PAC access at 4:15 at [facility] tomorrow and report to [facility] radiology at 4:45 for CT scan. Suggested he take an antiemetic before he goes to decrease chance of nausea from oral contrast. He will be seen on 5/23 at 1:15 pm to review scan and decide future plans.

## 2023-12-27 NOTE — Progress Notes (Signed)
 Disability forms completed as requested and faxed to [facility] [place]. Fax transmission confirmation received. Patient copy picked up at [facility] at [facility] as requested. No other needs or concerns noted at this time.

## 2023-12-27 NOTE — Progress Notes (Signed)
 [facility] OFFICE PROGRESS NOTE   Diagnosis: Gastric cancer  INTERVAL HISTORY:   [name] returns as scheduled.  He completed a cycle of FOLFOX/nivolumab /zolbetuximab on 12/13/2023.  He had nausea on the day of chemotherapy.  He continues to have nausea with intermittent emesis.  He is tolerating liquids and a soft diet.  He is having bowel movements. No peripheral numbness.  He stays in bed the majority of the day.  He is not capable of dressing himself or ambulating independently.  His wife is present today. He continues to take Dilaudid  twice daily for relief of pain.  Objective:  Vital signs in last 24 hours:  Blood pressure 91/68, pulse (!) 110, temperature 97.6 F (36.4 C), temperature source Oral, resp. rate 17, height 6\' 1"  (1.854 m), weight 118 lb 12.8 oz (53.9 kg), SpO2 100%.    HEENT: No thrush Resp: Lungs clear bilaterally Cardio: Tachycardia, regular rhythm GI: Mildly distended, no mass, nontender, no hepatosplenomegaly Vascular: No leg edema Neuro: Mild loss of vibratory sense at the fingertips bilaterally   Portacath/PICC-without erythema  Lab Results:  Lab Results  Component Value Date   WBC 16.6 (H) 12/27/2023   HGB 10.4 (L) 12/27/2023   HCT 32.3 (L) 12/27/2023   MCV 95.0 12/27/2023   PLT 164 12/27/2023   NEUTROABS 13.9 (H) 12/27/2023    CMP  Lab Results  Component Value Date   NA 133 (L) 12/27/2023   K 3.6 12/27/2023   CL 102 12/27/2023   CO2 18 (L) 12/27/2023   GLUCOSE 108 (H) 12/27/2023   BUN 11 12/27/2023   CREATININE 0.42 (L) 12/27/2023   CALCIUM  8.0 (L) 12/27/2023   PROT 4.0 (L) 12/27/2023   ALBUMIN  2.3 (L) 12/27/2023   AST 16 12/27/2023   ALT 9 12/27/2023   ALKPHOS 145 (H) 12/27/2023   BILITOT 0.3 12/27/2023   GFRNONAA >60 12/27/2023   GFRAA >60 07/11/2019    Lab Results  Component Value Date   CEA 1.31 08/11/2023     Medications: I have reviewed the patient's current  medications.   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 Paracentesis 07/17/2023-acute inflammation, no malignant cells CT biopsy of right upper quadrant omental thickening 07/19/2023: Metastatic total differentiated carcinoma consistent with primary gastric carcinoma, normal mismatch repair protein expression , HER2 (0); foundation 1-microsatellite stable, tumor mutation burden 2, PD-L1 CPS 1; [place] PD-L1 CPS 60% POSITIVE expression of CLND18: 100% Cycle 1 FOLFOX/nivolumab  08/16/2023 Cycle 2 FOLFOX/nivolumab  08/30/2023 Cycle 3 FOLFOX/nivolumab  09/13/2023 CT abdomen/pelvis 09/28/2023: Large volume ascites, difficult to assess omentum, decreased conspicuity of hepatic hypodensities, nonspecific thickening at the pyloric region of the stomach Cycle 4 FOLFOX/nivolumab  10/04/2023, GCSF added Cycle 5 FOLFOX/nivolumab  10/18/2023, GCSF CTs 10/25/2023-focally dilated loop of jejunum in the left upper quadrant without obvious evidence of obstruction.  Focal thickening of the walls of the mid transverse colon.  Bowel wall thickening involving the gastric antrum and pylorus.  Moderate to large ascites with peritoneal enhancement and omental caking. Cycle 6 FOLFOX/nivolumab  11/13/2023, G-CSF Cycle 7 FOLFOX/nivolumab  11/28/2023, G-CSF, day 1 zolbetuximab 11/27/2023 Cycle 8 FOLFOX/nivolumab  plus zolbetuximab 12/13/2023   Pain secondary to #1 History of recurrent venous thrombosis-maintained on apixaban   anticoagulation Tobacco and alcohol use Family history of multiple  cancers Anorexia/weight loss and constipation secondary to #1 - on multiple medications, not a candidate for abd feeding tube due to malignant ascites. Followed by Nutritionist  Left hip replacement June 2024 Hypokalemia, secondary to poor nutrition and n/v. Currently takes oral KCL BID Admission 09/27/23 - 09/29/23 for intractable N/V, negative for gastric outlet or small bowel obstruction. CT showed no disease progression. Sx improved after paracentesis (5 L) Admission 10/23/23 - 11/01/2023 for intractable N/V, CT: ascites, possible ileus, and concern for peritoneal carcinomatosis. S/p paracentesis. EGD postponed to 3/25 due to febrile neutropenia, treated with abx, cultures negative. Endoscopy [name]): esophagitis and small hernia, no GOO or any role for stenting. Path negative for viral or fungal etiology. He was        Disposition: [name] has metastatic gastric cancer.  He has completed 8 cycles of systemic therapy.  Abdominal pain has partially improved, but he continues to have nausea/vomiting and failure to thrive.  He has a poor performance status, ECOG 3.  I do not recommend chemotherapy today.  I discussed hospice care with [name] and his wife.  He does not wish to enroll in hospice at present.  He agrees to a restaging CT evaluation over the next few days.  He will return for an office visit after the CT to discuss treatment options.  [name] declined intravenous fluids today.  Coni Deep, MD  12/27/2023  12:20 PM

## 2023-12-28 ENCOUNTER — Inpatient Hospital Stay

## 2023-12-28 ENCOUNTER — Ambulatory Visit (HOSPITAL_COMMUNITY)
Admission: RE | Admit: 2023-12-28 | Discharge: 2023-12-28 | Disposition: A | Discharge status: Home / Self Care | Source: Ambulatory Visit | Attending: Oncology | Admitting: Oncology

## 2023-12-28 DIAGNOSIS — C163 Malignant neoplasm of pyloric antrum: Secondary | ICD-10-CM | POA: Diagnosis present

## 2023-12-28 MED ORDER — HEPARIN SOD (PORK) LOCK FLUSH 100 UNIT/ML IV SOLN
500.0000 [IU] | Freq: Once | INTRAVENOUS | Status: AC
  Administered 2023-12-28: 100 [IU] via INTRAVENOUS

## 2023-12-28 MED ORDER — IOHEXOL 9 MG/ML PO SOLN
ORAL | Status: AC
Start: 2023-12-28 — End: ?
  Filled 2023-12-28: qty 1000

## 2023-12-28 MED ORDER — IOHEXOL 300 MG/ML  SOLN
100.0000 mL | Freq: Once | INTRAMUSCULAR | Status: AC | PRN
  Administered 2023-12-28: 100 mL via INTRAVENOUS

## 2023-12-29 ENCOUNTER — Inpatient Hospital Stay

## 2023-12-29 ENCOUNTER — Inpatient Hospital Stay (HOSPITAL_BASED_OUTPATIENT_CLINIC_OR_DEPARTMENT_OTHER): Admitting: Nurse Practitioner

## 2023-12-29 ENCOUNTER — Other Ambulatory Visit: Payer: Self-pay | Admitting: Nurse Practitioner

## 2023-12-29 ENCOUNTER — Encounter: Payer: Self-pay | Admitting: Nurse Practitioner

## 2023-12-29 ENCOUNTER — Ambulatory Visit: Admitting: Nurse Practitioner

## 2023-12-29 DIAGNOSIS — C163 Malignant neoplasm of pyloric antrum: Secondary | ICD-10-CM

## 2023-12-29 DIAGNOSIS — C800 Disseminated malignant neoplasm, unspecified: Secondary | ICD-10-CM | POA: Diagnosis not present

## 2023-12-29 DIAGNOSIS — Z5111 Encounter for antineoplastic chemotherapy: Secondary | ICD-10-CM | POA: Diagnosis not present

## 2023-12-29 DIAGNOSIS — E860 Dehydration: Secondary | ICD-10-CM

## 2023-12-29 MED ORDER — ONDANSETRON HCL 4 MG/2ML IJ SOLN
8.0000 mg | Freq: Once | INTRAMUSCULAR | Status: AC
Start: 2023-12-29 — End: 2023-12-29
  Administered 2023-12-29: 8 mg via INTRAVENOUS
  Filled 2023-12-29: qty 4

## 2023-12-29 MED ORDER — SODIUM CHLORIDE 0.9 % IV SOLN: Freq: Once | INTRAVENOUS | Status: AC

## 2023-12-29 MED ORDER — HYDROMORPHONE HCL 4 MG PO TABS: 4.0000 mg | ORAL_TABLET | ORAL | 0 refills | Status: DC | PRN

## 2023-12-29 NOTE — Patient Instructions (Signed)

## 2023-12-29 NOTE — Progress Notes (Signed)
 [facility] OFFICE PROGRESS NOTE   Diagnosis: Gastric cancer  INTERVAL HISTORY:   [name] returns as scheduled.  He completed cycle 8 FOLFOX/nivolumab  plus zolbetuximab 12/13/2023.  He continues to have nausea/vomiting.  He expectorates phlegm.  Oral intake remains poor.  Energy level is decreased.  Objective:  Vital signs in last 24 hours:  Blood pressure 97/62, pulse (!) 123, temperature 98.1 F (36.7 C), temperature source Temporal, resp. rate 18, height 6\' 1"  (1.854 m), weight 118 lb (53.5 kg), SpO2 100%.    HEENT: No thrush or ulcers. Resp: Breath sounds diminished left lower lung field.  No respiratory distress. Cardio: Regular, tachycardic. GI: Abdomen is distended. Vascular: No leg edema. Port-A-Cath without erythema.  Lab Results:  Lab Results  Component Value Date   WBC 16.6 (H) 12/27/2023   HGB 10.4 (L) 12/27/2023   HCT 32.3 (L) 12/27/2023   MCV 95.0 12/27/2023   PLT 164 12/27/2023   NEUTROABS 13.9 (H) 12/27/2023    Imaging:  CT CHEST ABDOMEN PELVIS W CONTRAST Result Date: 12/28/2023 CLINICAL DATA:  Metastatic gastric cancer restaging * Tracking Code: BO * EXAM: CT CHEST, ABDOMEN, AND PELVIS WITH CONTRAST TECHNIQUE: Multidetector CT imaging of the chest, abdomen and pelvis was performed following the standard protocol during bolus administration of intravenous contrast. RADIATION DOSE REDUCTION: This exam was performed according to the departmental dose-optimization program which includes automated exposure control, adjustment of the mA and/or kV according to patient size and/or use of iterative reconstruction technique. CONTRAST:  OMNIPAQUE  IOHEXOL  300 MG/ML SOLN additional oral enteric contrast COMPARISON:  10/25/2023 FINDINGS: CT CHEST FINDINGS Cardiovascular: Right chest port catheter. Aortic atherosclerosis. Normal heart size. No pericardial effusion. Mediastinum/Nodes: No enlarged mediastinal, hilar, or axillary lymph nodes. Frothy  debris throughout the trachea. Thyroid  and esophagus demonstrate no significant findings. Lungs/Pleura: Moderate centrilobular emphysema. New moderate left, small right pleural effusions and associated atelectasis or consolidation. Additional bandlike scarring of the inferior right upper lobe. Musculoskeletal: No chest wall abnormality. No acute osseous findings. CT ABDOMEN PELVIS FINDINGS Hepatobiliary: No focal liver abnormality is seen. Status post cholecystectomy. No biliary dilatation. Pancreas: Unremarkable. No pancreatic ductal dilatation or surrounding inflammatory changes. Spleen: Normal in size without significant abnormality. Adrenals/Urinary Tract: Adrenal glands are unremarkable. Unchanged lobulated renal cortical scarring, particularly in the right. No calculi or hydronephrosis. Bladder is unremarkable. Stomach/Bowel: Unchanged wall thickening involving the gastric antrum (series 2, image 59). Appendix appears normal. No evidence of bowel wall thickening, distention, or inflammatory changes. Vascular/Lymphatic: Aortic atherosclerosis. No enlarged abdominal or pelvic lymph nodes. Reproductive: No mass or other abnormality. Other: Evidence of left inguinal hernia repair. Small, fat containing right inguinal hernia. Anasarca. Similar, large volume ascites with similar burden of omental caking and diffuse peritoneal thickening (series 2, image 64). Musculoskeletal: No acute osseous findings. Status post left hip total arthroplasty. IMPRESSION: 1. Unchanged wall thickening involving the gastric antrum, consistent with primary gastric malignancy. 2. Similar, large volume ascites with similar burden of omental caking and diffuse peritoneal thickening, consistent with peritoneal carcinomatosis. 3. New moderate left, small right pleural effusions and associated atelectasis or consolidation. No directly visible peritoneal thickening or nodularity. 4. Emphysema. Aortic Atherosclerosis (ICD10-I70.0) and Emphysema  (ICD10-J43.9). Electronically Signed   By: Fredricka Jenny M.D.   On: 12/28/2023 21:56    Medications: I have reviewed the patient's current medications.  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 Paracentesis 07/17/2023-acute inflammation, no malignant cells CT biopsy of right upper quadrant omental thickening 07/19/2023: Metastatic total differentiated carcinoma consistent with primary gastric carcinoma, normal mismatch repair protein expression , HER2 (0); foundation 1-microsatellite stable, tumor mutation burden 2, PD-L1 CPS 1; [place] PD-L1 CPS 60% POSITIVE expression of CLND18: 100% Cycle 1 FOLFOX/nivolumab  08/16/2023 Cycle 2 FOLFOX/nivolumab  08/30/2023 Cycle 3 FOLFOX/nivolumab  09/13/2023 CT abdomen/pelvis 09/28/2023: Large volume ascites, difficult to assess omentum, decreased conspicuity of hepatic hypodensities, nonspecific thickening at the pyloric region of the stomach Cycle 4 FOLFOX/nivolumab  10/04/2023, GCSF added Cycle 5 FOLFOX/nivolumab  10/18/2023, GCSF CTs 10/25/2023-focally dilated loop of jejunum in the left upper quadrant without obvious evidence of obstruction.  Focal thickening of the walls of the mid transverse colon.  Bowel wall thickening involving the gastric antrum and pylorus.  Moderate to large ascites with peritoneal enhancement and omental caking. Cycle 6 FOLFOX/nivolumab  11/13/2023, G-CSF Cycle 7 FOLFOX/nivolumab  11/28/2023, G-CSF, day 1 zolbetuximab 11/27/2023 Cycle 8 FOLFOX/nivolumab  plus zolbetuximab  12/13/2023 CTs 12/28/2023-unchanged wall thickening gastric antrum.  Similar large volume ascites.  Similar burden of omental caking and diffuse peritoneal thickening.  New moderate left and small right pleural effusions.   Pain secondary to #1 History of recurrent venous thrombosis-maintained on apixaban  anticoagulation Tobacco and alcohol use Family history of multiple cancers Anorexia/weight loss and constipation secondary to #1 - on multiple medications, not a candidate for abd feeding tube due to malignant ascites. Followed by Nutritionist  Left hip replacement June 2024 Hypokalemia, secondary to poor nutrition and n/v. Currently takes oral KCL BID Admission 09/27/23 - 09/29/23 for intractable N/V, negative for gastric outlet or small bowel obstruction. CT showed no disease progression. Sx improved after paracentesis (5 L) Admission 10/23/23 - 11/01/2023 for intractable N/V, CT: ascites, possible ileus, and concern for peritoneal carcinomatosis. S/p paracentesis. EGD postponed to 3/25 due to febrile neutropenia, treated with abx, cultures negative. Endoscopy [name]): esophagitis and small hernia, no GOO or any role for stenting. Path negative for viral or fungal etiology. He was   [name] has metastatic gastric cancer.  He has completed 8 cycles of FOLFOX/nivolumab  plus zolbetuximab.  Restaging CTs show overall stable gastric wall thickening, carcinomatosis and ascites.  There is a new left pleural effusion.  Results/images reviewed with him and his wife at today's visit.  He has a poor performance status.  He does not appear to be a candidate to continue systemic therapy in his current condition.  He agrees with Dr. Enedina Harrow recommendation to discontinue treatment and enroll in hospice.  End-of-life issues/CODE STATUS reviewed with him at today's visit.  He will be placed on No Code Blue status.  He will return for a follow-up appointment next week.  Patient seen with Dr. Scherrie Curt.     Diana Forster ANP/GNP-BC   12/29/2023  2:18 PM This was a shared visit with Diana Forster.  We reviewed the CT images with [name] and his wife.  There is evidence of persistent metastatic gastric cancer with gastric wall thickening, ascites, and carcinomatosis.  There is a new left pleural effusion.  We discussed treatment options.  [name] has a poor performance status, ECOG 3.  I recommend hospice care.  He agrees.  We discussed CPR and ACLS.  He agrees to a no CODE BLUE  status.  I was present for greater than 50% of today's visit.  I performed Medical Decision Making.  Anthony Kerns, MD

## 2023-12-30 ENCOUNTER — Encounter

## 2023-12-31 DIAGNOSIS — J439 Emphysema, unspecified: Secondary | ICD-10-CM | POA: Diagnosis not present

## 2023-12-31 DIAGNOSIS — K219 Gastro-esophageal reflux disease without esophagitis: Secondary | ICD-10-CM | POA: Diagnosis not present

## 2023-12-31 DIAGNOSIS — C163 Malignant neoplasm of pyloric antrum: Secondary | ICD-10-CM | POA: Diagnosis not present

## 2023-12-31 DIAGNOSIS — Q2112 Patent foramen ovale: Secondary | ICD-10-CM | POA: Diagnosis not present

## 2023-12-31 DIAGNOSIS — E785 Hyperlipidemia, unspecified: Secondary | ICD-10-CM | POA: Diagnosis not present

## 2023-12-31 DIAGNOSIS — C800 Disseminated malignant neoplasm, unspecified: Secondary | ICD-10-CM | POA: Diagnosis not present

## 2023-12-31 DIAGNOSIS — I739 Peripheral vascular disease, unspecified: Secondary | ICD-10-CM | POA: Diagnosis not present

## 2023-12-31 DIAGNOSIS — D689 Coagulation defect, unspecified: Secondary | ICD-10-CM | POA: Diagnosis not present

## 2023-12-31 DIAGNOSIS — R180 Malignant ascites: Secondary | ICD-10-CM | POA: Diagnosis not present

## 2023-12-31 DIAGNOSIS — I700 Atherosclerosis of aorta: Secondary | ICD-10-CM | POA: Diagnosis not present

## 2023-12-31 DIAGNOSIS — E876 Hypokalemia: Secondary | ICD-10-CM | POA: Diagnosis not present

## 2023-12-31 DIAGNOSIS — I10 Essential (primary) hypertension: Secondary | ICD-10-CM | POA: Diagnosis not present

## 2024-01-01 DIAGNOSIS — I10 Essential (primary) hypertension: Secondary | ICD-10-CM | POA: Diagnosis not present

## 2024-01-01 DIAGNOSIS — R180 Malignant ascites: Secondary | ICD-10-CM | POA: Diagnosis not present

## 2024-01-01 DIAGNOSIS — D689 Coagulation defect, unspecified: Secondary | ICD-10-CM | POA: Diagnosis not present

## 2024-01-01 DIAGNOSIS — C800 Disseminated malignant neoplasm, unspecified: Secondary | ICD-10-CM | POA: Diagnosis not present

## 2024-01-01 DIAGNOSIS — I739 Peripheral vascular disease, unspecified: Secondary | ICD-10-CM | POA: Diagnosis not present

## 2024-01-01 DIAGNOSIS — E876 Hypokalemia: Secondary | ICD-10-CM | POA: Diagnosis not present

## 2024-01-01 DIAGNOSIS — Q2112 Patent foramen ovale: Secondary | ICD-10-CM | POA: Diagnosis not present

## 2024-01-01 DIAGNOSIS — I700 Atherosclerosis of aorta: Secondary | ICD-10-CM | POA: Diagnosis not present

## 2024-01-01 DIAGNOSIS — E785 Hyperlipidemia, unspecified: Secondary | ICD-10-CM | POA: Diagnosis not present

## 2024-01-01 DIAGNOSIS — C163 Malignant neoplasm of pyloric antrum: Secondary | ICD-10-CM | POA: Diagnosis not present

## 2024-01-01 DIAGNOSIS — K219 Gastro-esophageal reflux disease without esophagitis: Secondary | ICD-10-CM | POA: Diagnosis not present

## 2024-01-01 DIAGNOSIS — J439 Emphysema, unspecified: Secondary | ICD-10-CM | POA: Diagnosis not present

## 2024-01-02 DIAGNOSIS — R180 Malignant ascites: Secondary | ICD-10-CM | POA: Diagnosis not present

## 2024-01-02 DIAGNOSIS — J439 Emphysema, unspecified: Secondary | ICD-10-CM | POA: Diagnosis not present

## 2024-01-02 DIAGNOSIS — D689 Coagulation defect, unspecified: Secondary | ICD-10-CM | POA: Diagnosis not present

## 2024-01-02 DIAGNOSIS — E876 Hypokalemia: Secondary | ICD-10-CM | POA: Diagnosis not present

## 2024-01-02 DIAGNOSIS — I700 Atherosclerosis of aorta: Secondary | ICD-10-CM | POA: Diagnosis not present

## 2024-01-02 DIAGNOSIS — K219 Gastro-esophageal reflux disease without esophagitis: Secondary | ICD-10-CM | POA: Diagnosis not present

## 2024-01-02 DIAGNOSIS — C800 Disseminated malignant neoplasm, unspecified: Secondary | ICD-10-CM | POA: Diagnosis not present

## 2024-01-02 DIAGNOSIS — I739 Peripheral vascular disease, unspecified: Secondary | ICD-10-CM | POA: Diagnosis not present

## 2024-01-02 DIAGNOSIS — E785 Hyperlipidemia, unspecified: Secondary | ICD-10-CM | POA: Diagnosis not present

## 2024-01-02 DIAGNOSIS — I10 Essential (primary) hypertension: Secondary | ICD-10-CM | POA: Diagnosis not present

## 2024-01-02 DIAGNOSIS — Q2112 Patent foramen ovale: Secondary | ICD-10-CM | POA: Diagnosis not present

## 2024-01-02 DIAGNOSIS — C163 Malignant neoplasm of pyloric antrum: Secondary | ICD-10-CM | POA: Diagnosis not present

## 2024-01-03 ENCOUNTER — Other Ambulatory Visit

## 2024-01-03 ENCOUNTER — Ambulatory Visit

## 2024-01-03 ENCOUNTER — Ambulatory Visit: Admitting: Nurse Practitioner

## 2024-01-03 DIAGNOSIS — C163 Malignant neoplasm of pyloric antrum: Secondary | ICD-10-CM | POA: Diagnosis not present

## 2024-01-03 DIAGNOSIS — E785 Hyperlipidemia, unspecified: Secondary | ICD-10-CM | POA: Diagnosis not present

## 2024-01-03 DIAGNOSIS — C800 Disseminated malignant neoplasm, unspecified: Secondary | ICD-10-CM | POA: Diagnosis not present

## 2024-01-03 DIAGNOSIS — D689 Coagulation defect, unspecified: Secondary | ICD-10-CM | POA: Diagnosis not present

## 2024-01-03 DIAGNOSIS — I10 Essential (primary) hypertension: Secondary | ICD-10-CM | POA: Diagnosis not present

## 2024-01-03 DIAGNOSIS — Q2112 Patent foramen ovale: Secondary | ICD-10-CM | POA: Diagnosis not present

## 2024-01-03 DIAGNOSIS — I739 Peripheral vascular disease, unspecified: Secondary | ICD-10-CM | POA: Diagnosis not present

## 2024-01-03 DIAGNOSIS — R180 Malignant ascites: Secondary | ICD-10-CM | POA: Diagnosis not present

## 2024-01-03 DIAGNOSIS — E876 Hypokalemia: Secondary | ICD-10-CM | POA: Diagnosis not present

## 2024-01-03 DIAGNOSIS — J439 Emphysema, unspecified: Secondary | ICD-10-CM | POA: Diagnosis not present

## 2024-01-03 DIAGNOSIS — I700 Atherosclerosis of aorta: Secondary | ICD-10-CM | POA: Diagnosis not present

## 2024-01-03 DIAGNOSIS — K219 Gastro-esophageal reflux disease without esophagitis: Secondary | ICD-10-CM | POA: Diagnosis not present

## 2024-01-04 DIAGNOSIS — R180 Malignant ascites: Secondary | ICD-10-CM | POA: Diagnosis not present

## 2024-01-04 DIAGNOSIS — I739 Peripheral vascular disease, unspecified: Secondary | ICD-10-CM | POA: Diagnosis not present

## 2024-01-04 DIAGNOSIS — I10 Essential (primary) hypertension: Secondary | ICD-10-CM | POA: Diagnosis not present

## 2024-01-04 DIAGNOSIS — I700 Atherosclerosis of aorta: Secondary | ICD-10-CM | POA: Diagnosis not present

## 2024-01-04 DIAGNOSIS — E876 Hypokalemia: Secondary | ICD-10-CM | POA: Diagnosis not present

## 2024-01-04 DIAGNOSIS — J439 Emphysema, unspecified: Secondary | ICD-10-CM | POA: Diagnosis not present

## 2024-01-04 DIAGNOSIS — K219 Gastro-esophageal reflux disease without esophagitis: Secondary | ICD-10-CM | POA: Diagnosis not present

## 2024-01-04 DIAGNOSIS — C800 Disseminated malignant neoplasm, unspecified: Secondary | ICD-10-CM | POA: Diagnosis not present

## 2024-01-04 DIAGNOSIS — E785 Hyperlipidemia, unspecified: Secondary | ICD-10-CM | POA: Diagnosis not present

## 2024-01-04 DIAGNOSIS — C163 Malignant neoplasm of pyloric antrum: Secondary | ICD-10-CM | POA: Diagnosis not present

## 2024-01-04 DIAGNOSIS — D689 Coagulation defect, unspecified: Secondary | ICD-10-CM | POA: Diagnosis not present

## 2024-01-04 DIAGNOSIS — Q2112 Patent foramen ovale: Secondary | ICD-10-CM | POA: Diagnosis not present

## 2024-01-05 DIAGNOSIS — C163 Malignant neoplasm of pyloric antrum: Secondary | ICD-10-CM | POA: Diagnosis not present

## 2024-01-05 DIAGNOSIS — E876 Hypokalemia: Secondary | ICD-10-CM | POA: Diagnosis not present

## 2024-01-05 DIAGNOSIS — D689 Coagulation defect, unspecified: Secondary | ICD-10-CM | POA: Diagnosis not present

## 2024-01-05 DIAGNOSIS — K219 Gastro-esophageal reflux disease without esophagitis: Secondary | ICD-10-CM | POA: Diagnosis not present

## 2024-01-05 DIAGNOSIS — C800 Disseminated malignant neoplasm, unspecified: Secondary | ICD-10-CM | POA: Diagnosis not present

## 2024-01-05 DIAGNOSIS — J439 Emphysema, unspecified: Secondary | ICD-10-CM | POA: Diagnosis not present

## 2024-01-05 DIAGNOSIS — Q2112 Patent foramen ovale: Secondary | ICD-10-CM | POA: Diagnosis not present

## 2024-01-05 DIAGNOSIS — I739 Peripheral vascular disease, unspecified: Secondary | ICD-10-CM | POA: Diagnosis not present

## 2024-01-05 DIAGNOSIS — E785 Hyperlipidemia, unspecified: Secondary | ICD-10-CM | POA: Diagnosis not present

## 2024-01-05 DIAGNOSIS — I10 Essential (primary) hypertension: Secondary | ICD-10-CM | POA: Diagnosis not present

## 2024-01-05 DIAGNOSIS — R180 Malignant ascites: Secondary | ICD-10-CM | POA: Diagnosis not present

## 2024-01-05 DIAGNOSIS — I700 Atherosclerosis of aorta: Secondary | ICD-10-CM | POA: Diagnosis not present

## 2024-01-07 DEATH — deceased

## 2024-01-16 ENCOUNTER — Telehealth: Admitting: Genetic Counselor
